# Patient Record
Sex: Female | Born: 1975 | Race: White | Hispanic: No | Marital: Single | Smoking: Former smoker
Health system: Southern US, Community
[De-identification: ages and names within clinical notes are randomized; demographics above are authoritative.]

## PROBLEM LIST (undated history)

## (undated) DIAGNOSIS — D1803 Hemangioma of intra-abdominal structures: Secondary | ICD-10-CM

## (undated) DIAGNOSIS — K2970 Gastritis, unspecified, without bleeding: Secondary | ICD-10-CM

## (undated) DIAGNOSIS — I10 Essential (primary) hypertension: Secondary | ICD-10-CM

## (undated) DIAGNOSIS — G4733 Obstructive sleep apnea (adult) (pediatric): Secondary | ICD-10-CM

## (undated) DIAGNOSIS — K219 Gastro-esophageal reflux disease without esophagitis: Secondary | ICD-10-CM

## (undated) DIAGNOSIS — R7989 Other specified abnormal findings of blood chemistry: Secondary | ICD-10-CM

## (undated) DIAGNOSIS — M109 Gout, unspecified: Secondary | ICD-10-CM

## (undated) DIAGNOSIS — D649 Anemia, unspecified: Secondary | ICD-10-CM

## (undated) DIAGNOSIS — N1832 Chronic kidney disease, stage 3b: Secondary | ICD-10-CM

## (undated) DIAGNOSIS — K209 Esophagitis, unspecified without bleeding: Secondary | ICD-10-CM

## (undated) DIAGNOSIS — D369 Benign neoplasm, unspecified site: Secondary | ICD-10-CM

## (undated) DIAGNOSIS — I5030 Unspecified diastolic (congestive) heart failure: Secondary | ICD-10-CM

## (undated) DIAGNOSIS — E041 Nontoxic single thyroid nodule: Secondary | ICD-10-CM

## (undated) DIAGNOSIS — D219 Benign neoplasm of connective and other soft tissue, unspecified: Secondary | ICD-10-CM

## (undated) DIAGNOSIS — K8020 Calculus of gallbladder without cholecystitis without obstruction: Secondary | ICD-10-CM

## (undated) HISTORY — DX: Essential (primary) hypertension: I10

## (undated) HISTORY — DX: Morbid (severe) obesity due to excess calories: E66.01

## (undated) HISTORY — DX: Unspecified diastolic (congestive) heart failure: I50.30

## (undated) HISTORY — DX: Gastro-esophageal reflux disease without esophagitis: K21.9

## (undated) HISTORY — DX: Benign neoplasm of connective and other soft tissue, unspecified: D21.9

## (undated) HISTORY — PX: HYSTERECTOMY ABDOMINAL WITH SALPINGECTOMY: SHX6725

---

## 2001-01-20 ENCOUNTER — Emergency Department (HOSPITAL_COMMUNITY): Admission: EM | Admit: 2001-01-20 | Discharge: 2001-01-20 | Payer: Self-pay | Admitting: Emergency Medicine

## 2006-09-04 HISTORY — PX: HYSTERECTOMY ABDOMINAL WITH SALPINGECTOMY: SHX6725

## 2007-04-08 ENCOUNTER — Inpatient Hospital Stay (HOSPITAL_COMMUNITY): Admission: RE | Admit: 2007-04-08 | Discharge: 2007-04-10 | Payer: Self-pay | Admitting: Obstetrics & Gynecology

## 2007-04-08 ENCOUNTER — Encounter: Payer: Self-pay | Admitting: Obstetrics & Gynecology

## 2011-01-17 NOTE — Op Note (Signed)
NAMERACQUELLE, EALES NO.:  [id number]   MEDICAL RECORD NO.:  [id number]          PATIENT TYPE:  INP   LOCATION:  A302                          FACILITY:  [facility]   PHYSICIAN:  Florian Buff, M.D.   DATE OF BIRTH:  [birth year]   DATE OF PROCEDURE:  04/08/2007  DATE OF DISCHARGE:                               OPERATIVE REPORT   PREOPERATIVE DIAGNOSES:  1. Metromenorrhagia  2. Dysmenorrhea.  3. Dyspareunia.  4. Morbid obesity.   POSTOPERATIVE DIAGNOSES:  1. Metromenorrhagia  2. Dysmenorrhea.  3. Dyspareunia.  4. Morbid obesity.  5. Left ovarian cyst.   OPERATION PERFORMED:  Total abdominal hysterectomy, bilateral salpingo-  oophorectomy.   SURGEON:  Florian Buff, M.D.   ANESTHESIA:  General endotracheal.   FINDINGS:  The patient had about a 4 cm ovarian cyst on the left side.  She had had that previously about a year and a half ago on ultrasound.  Otherwise, the uterus, tubes and ovaries were normal.   DESCRIPTION OF PROCEDURE:  The patient was taken to the operating room  and placed in supine position where she underwent general endotracheal  anesthesia.  The vagina was prepped.  The Foley catheter was placed.  The abdomen was prepped and draped in the usual sterile fashion.  A  Pfannenstiel skin incision was made and carried down sharply to the  rectus fascia which was scored in the midline and extended laterally.  The fascia was taken off the muscle superiorly and inferiorly without  difficulty.  The muscles were divided, the peritoneal cavity was  entered.  An Alexis self-retaining wound retractor was placed and the  upper abdomen was packed away.  The patient was placed in Trendelenburg  position.  The uterine cornu were grasped.  The left round ligament was  suture ligated and cut.  The infundibulopelvic ligament on the left was  clamped, cut and suture ligated.  The right round ligament was suture  ligated and cut.  The infundibulopelvic  ligament on the right was  clamped, cut and suture ligated.  The bladder was then pushed down off  the lower uterine segment.  She did have some adhesions to the lower  uterine segment from previous cesarean section.  The uterine vessels  were skeletonized.  They were clamped, cut and suture ligated, serial  pedicles taken down to cervix, to the cardinal ligaments, each pedicle  being clamped, but and suture ligated.  The vagina was cross-clamped.  Specimens removed.  Vaginal angle sutures were placed and the vagina was  closed with interrupted figure-of-eight sutures.  The pelvis was found  to be hemostatic.  It was irrigated vigorously.  Sutures were cut.  The  wound retractor and the packs were removed. All counts were correct.  The muscles and peritoneum were reapproximated loosely.  Fascia closed  using 0-0 Vicryl running, subcutaneous tissue was made hemostatic and  irrigated.  A Jackson-Pratt drain was placed because of her obesity.  Skin was closed using skin staples.  The patient tolerated the procedure  well.  She experienced 100 mL of blood loss,  taken to recovery room in  good and stable condition, all counts correct.      Florian Buff, M.D.  Electronically Signed     LHE/MEDQ  D:  04/08/2007  T:  04/08/2007  Job:  [id number]

## 2011-01-17 NOTE — Discharge Summary (Signed)
[name], [name] NO.:  [id number]   MEDICAL RECORD NO.:  [id number]          PATIENT TYPE:  INP   LOCATION:  A302                          FACILITY:  [facility]   PHYSICIAN:  Florian Buff, M.D.   DATE OF BIRTH:  [birth year]   DATE OF ADMISSION:  04/08/2007  DATE OF DISCHARGE:  08/06/2008LH                               DISCHARGE SUMMARY   DISCHARGE DIAGNOSES:  1. Status post TAH BSO.  2. Unremarkable postoperative course.   PROCEDURES:  TAH BSO.   Please refer to the transcribed History and Physical in the OP report  for details of admission to the [facility]:  The patient was admitted postoperatively.  She had a  normal course.  Tolerated clear liquids and regular diet, both without  symptoms.  She was extensively ambulatory, passed flatus.  Her incision  was clean, dry and intact.  JP drain was left in place and had  appropriate postop output.  Hemoglobin and hematocrit were excellent  postop at 13 and 12.7.  Discharged home in the morning of postop day #2  in good stable condition.  Follow up in the office next week to have her  incision evaluated and drain removed and staples removed.  She was given  Percocet and Motrin for pain at the time of discharge, and was given  instruction precautions for return.      Florian Buff, M.D.  Electronically Signed     LHE/MEDQ  D:  04/10/2007  T:  04/10/2007  Job:  [id number]

## 2011-01-17 NOTE — H&P (Signed)
NAMEDERRA, MORETZ NO.:  [id number]   MEDICAL RECORD NO.:  [id number]          PATIENT TYPE:  INP   LOCATION:  A302                          FACILITY:  [facility]   PHYSICIAN:  Florian Buff, M.D.   DATE OF BIRTH:  [birth year]   DATE OF ADMISSION:  04/08/2007  DATE OF DISCHARGE:  LH                              HISTORY & PHYSICAL   HISTORY OF PRESENT ILLNESS:  The patient is a 35 year old female,  Gravida 2, para 1, abortus 1, status post a cesarean section in 2004,  who has been seen in the office several times since the Spring in our  office, complaining of almost continuous bleeding, dyspareunia, and  dysmenorrhea.  Evaluation to this point has been unremarkable.  She  basically states that the intercourse, whenever her cervix is struck,  she has had a transvaginal ultrasound which is otherwise normal except  for a simple cyst in her right ovary that has subsequently resolved.  We  tried her on a course of Megace, a course of antibiotics, none of which  have helped.  This has been going on now for approximately 17 months at  least from the time that she started seeing [place] in her office and she has  not improved.  We discussed other options and the patient wants to  proceed with definitive therapy so will proceed with total abdominal  hysterectomy, bilateral salpingo-oophorectomy.  She has no decent.  She  is morbidly obese.  She has had a cesarean section so I do not think a  vaginal hysterectomy is appropriate.   PAST MEDICAL HISTORY:  The past medical history is negative.   PAST SURGICAL HISTORY:  The past surgical history is significant for a  cesarean section.   PAST OB HISTORY:  As above.   ALLERGIES:  None.   MEDICATIONS:  Medications are just the Megace that we gave her,  otherwise none.   PHYSICAL EXAMINATION:  VITAL SIGNS:  Her blood pressure is 140/88.  HEENT:  Unremarkable.  Her thyroid is normal.  LUNGS:  The lungs are clear.  HEART:  Regular  rate and rhythm without regurgitation or gallop.  BREASTS:  The breasts are without masses, discharge, or skin changes.  ABDOMEN:  The abdomen is benign, no hepatosplenomegaly or masses,  nontender, and is obese.  PELVIC:  She has normal external genitalia.  The vagina is pink, moist,  and without discharge.  The cervix is parous, nulliparous without  lesions.  The uterus is normal size, shape, and contour.  The adnexa is  negative.  EXTREMITIES:  The extremities are warm, no edema.  NEUROLOGICAL:  The neurological examination is virtually intact.   IMPRESSION:  1. Menometrorrhagia.  2. Dysmenorrhea.  3. Dyspareunia.   PLAN:  The patient is admitted for total abdominal hysterectomy,  bilateral salpingo-oophorectomy.  She understands the risks, benefits,  indications, and alternatives, and will proceed.  She understands that  this will make her menopausal but since she is having adnexal or lateral  pain with her periods and with intercourse, this is the appropriate  management, and  she will proceed.      Florian Buff, M.D.  Electronically Signed     LHE/MEDQ  D:  04/07/2007  T:  04/07/2007  Job:  [id number]   cc:   Florian Buff, M.D.  Fax: [phone]

## 2011-06-19 LAB — DIFFERENTIAL
Basophils Relative: 1
Basophils Relative: 1
Eosinophils Absolute: 0
Eosinophils Absolute: 0.1
Eosinophils Absolute: 0.1
Eosinophils Relative: 2
Lymphs Abs: 1.6
Monocytes Absolute: 0.5
Monocytes Relative: 5
Monocytes Relative: 6
Monocytes Relative: 6
Neutrophils Relative %: 62
Neutrophils Relative %: 64
Neutrophils Relative %: 82 — ABNORMAL HIGH

## 2011-06-19 LAB — CBC
HCT: 38.9
Hemoglobin: 14.6
MCHC: 34.2
MCV: 88.7
MCV: 89.7
RBC: 4.16
RBC: 4.39
RBC: 4.84
WBC: 14 — ABNORMAL HIGH
WBC: 6

## 2011-06-19 LAB — TYPE AND SCREEN: ABO/RH(D): O POS

## 2011-06-19 LAB — COMPREHENSIVE METABOLIC PANEL
ALT: 22
Alkaline Phosphatase: 56
CO2: 31
Chloride: 99
GFR calc non Af Amer: >60
Glucose, Bld: 78
Potassium: 3.7
Sodium: 138
Total Protein: 6.1

## 2011-06-19 LAB — URINALYSIS, ROUTINE W REFLEX MICROSCOPIC
Bilirubin Urine: NEGATIVE
Glucose, UA: NEGATIVE
Ketones, ur: NEGATIVE
Leukocytes, UA: NEGATIVE
pH: 7.5

## 2017-10-22 DIAGNOSIS — S68119A Complete traumatic metacarpophalangeal amputation of unspecified finger, initial encounter: Secondary | ICD-10-CM | POA: Insufficient documentation

## 2018-03-27 ENCOUNTER — Other Ambulatory Visit (HOSPITAL_COMMUNITY): Payer: Self-pay | Admitting: *Deleted

## 2018-03-27 DIAGNOSIS — R221 Localized swelling, mass and lump, neck: Secondary | ICD-10-CM

## 2018-04-02 ENCOUNTER — Ambulatory Visit (HOSPITAL_COMMUNITY)
Admission: RE | Admit: 2018-04-02 | Discharge: 2018-04-02 | Disposition: A | Discharge status: Home / Self Care | Payer: Medicaid Other | Source: Ambulatory Visit | Attending: *Deleted | Admitting: *Deleted

## 2018-04-02 DIAGNOSIS — E042 Nontoxic multinodular goiter: Secondary | ICD-10-CM | POA: Insufficient documentation

## 2018-04-02 DIAGNOSIS — R221 Localized swelling, mass and lump, neck: Secondary | ICD-10-CM | POA: Diagnosis present

## 2018-04-02 DIAGNOSIS — R1310 Dysphagia, unspecified: Secondary | ICD-10-CM | POA: Diagnosis not present

## 2018-04-02 DIAGNOSIS — Z87891 Personal history of nicotine dependence: Secondary | ICD-10-CM | POA: Diagnosis not present

## 2018-05-04 IMAGING — US US SOFT TISSUE HEAD/NECK
1 series · 12 of 25 positions shown · non-contrast
Comparison: None.

CLINICAL DATA: Palpable abnormality.  Palpable right neck mass.

EXAM:
THYROID ULTRASOUND
TECHNIQUE: Ultrasound examination of the thyroid gland and adjacent soft
tissues was performed.

[Series 1: us soft tissue head/neck · 0.04mm/px · 12 of 99 slices shown]
[im 5/99]
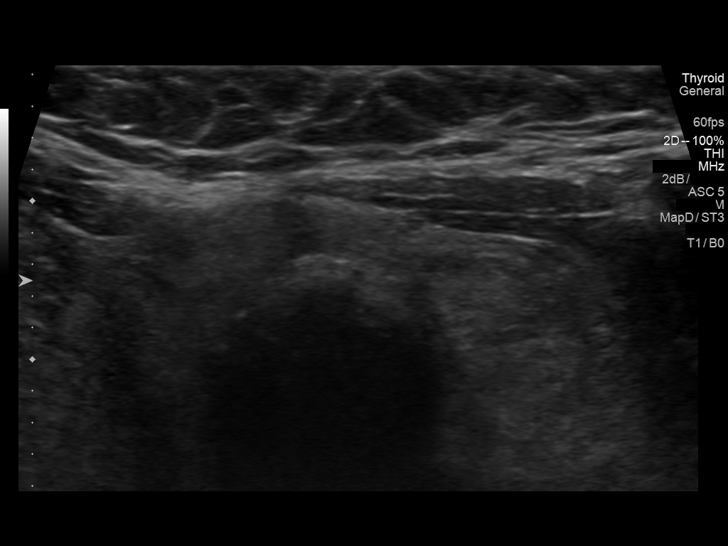
[im 13/99]
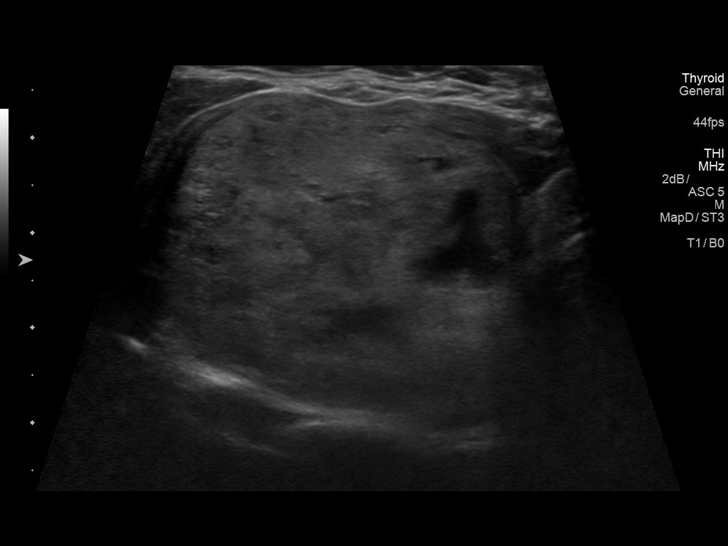
[im 21/99]
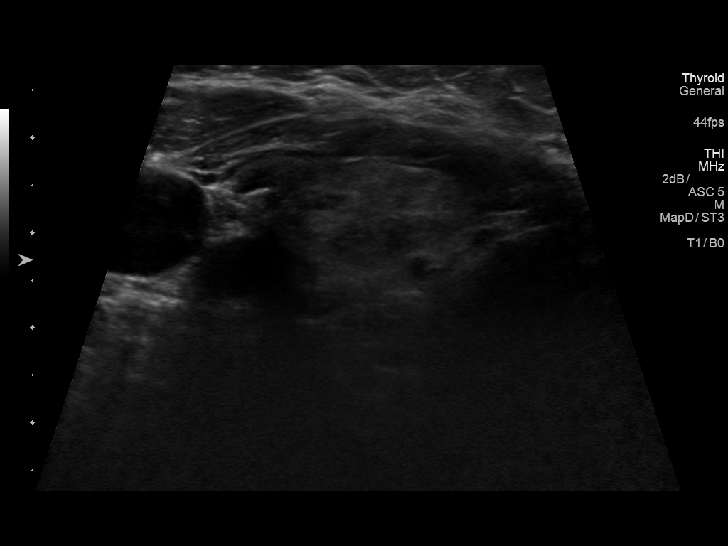
[im 29/99]
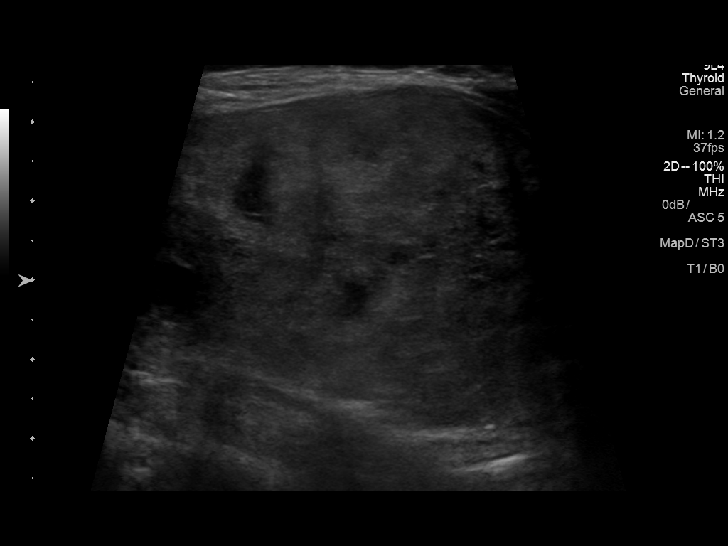
[im 37/99]
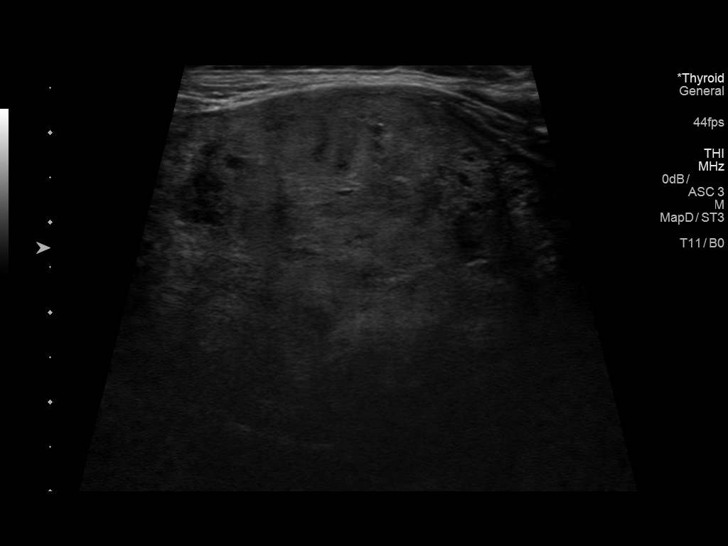
[im 45/99]
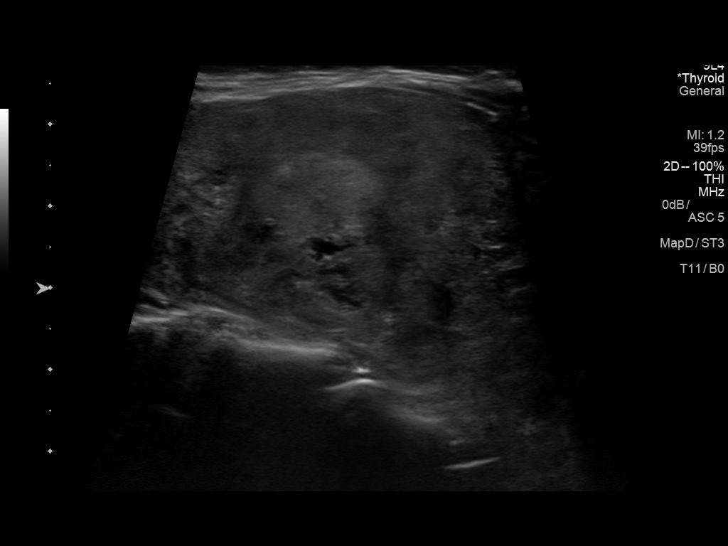
[im 54/99]
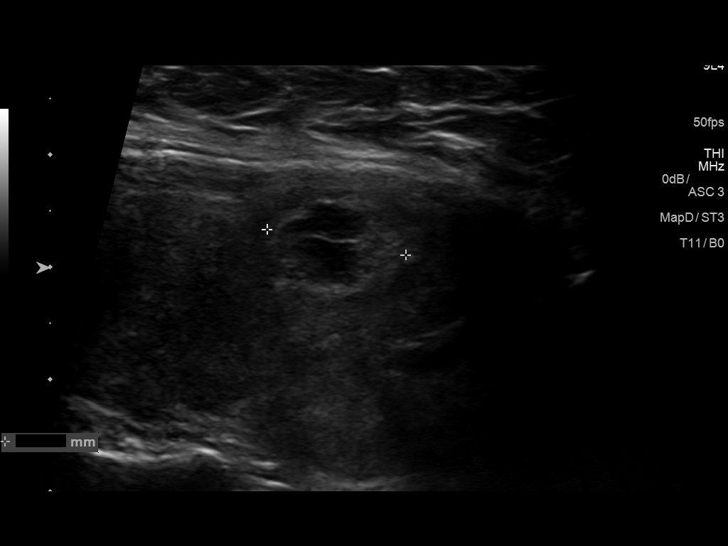
[im 62/99]
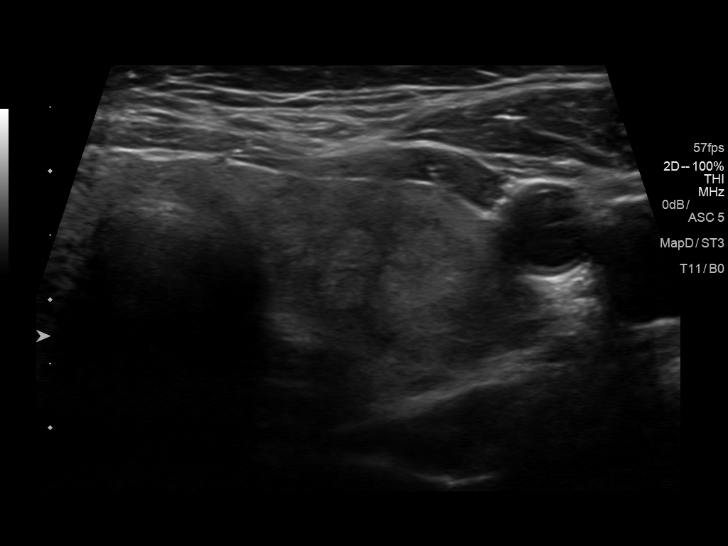
[im 70/99]
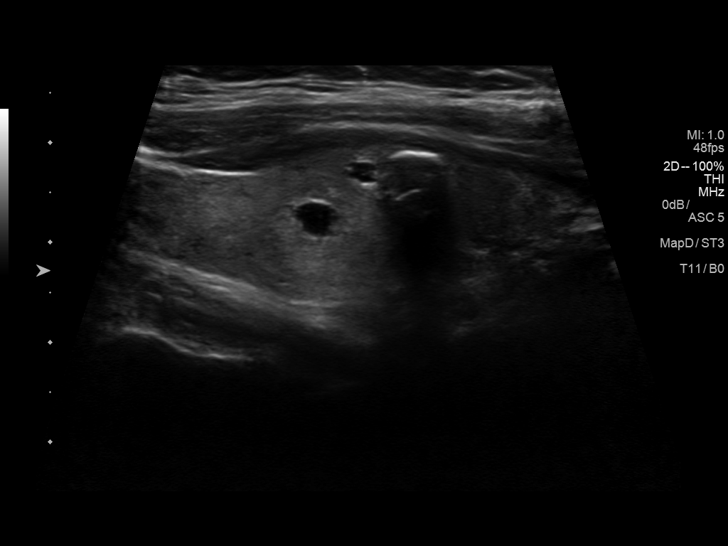
[im 78/99]
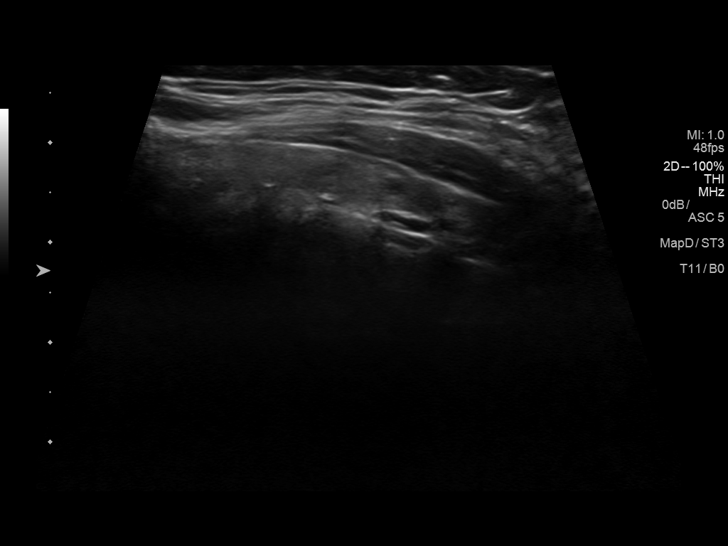
[im 86/99]
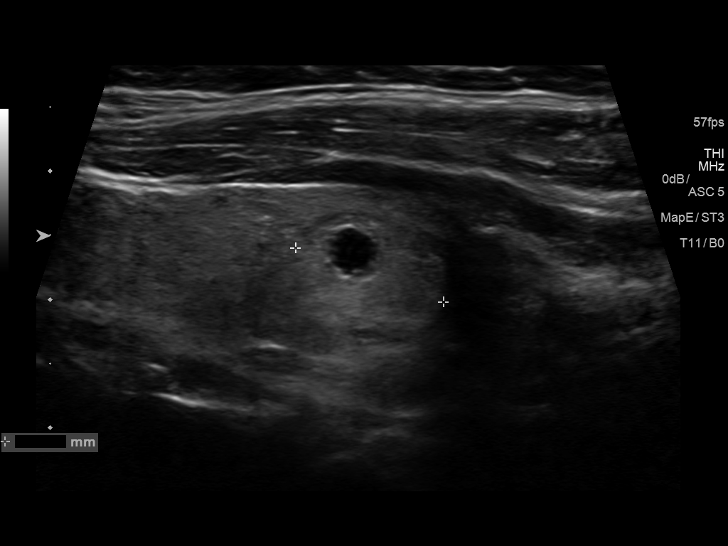
[im 94/99]
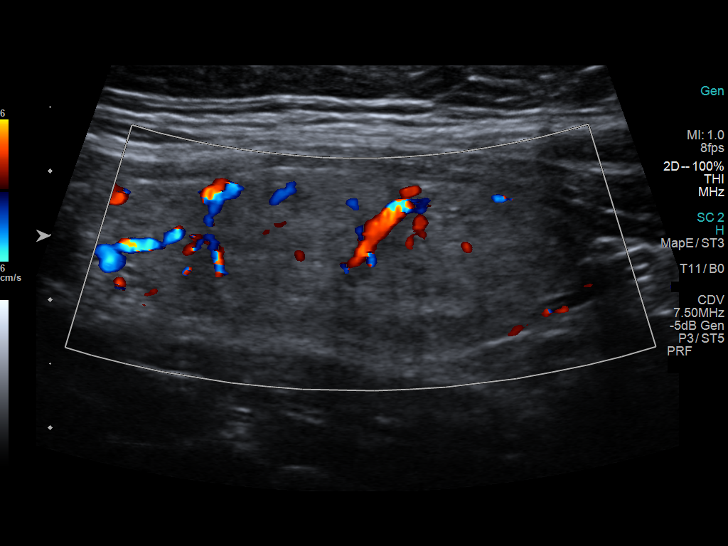

[12 of 25 positions shown; findings below may reference images not displayed]

FINDINGS: Parenchymal Echotexture: Moderately heterogenous

Isthmus: 0.5 cm

Right lobe: 6.2 x 4.2 x 4.1 cm

Left lobe: 5.0 x 2.0 x 2.0 cm

_________________________________________________________

Estimated total number of nodules >/= 1 cm: 3

Number of spongiform nodules >/=  2 cm not described below (TR1): 0

Number of mixed cystic and solid nodules >/= 1.5 cm not described
below (TR2): 0

_________________________________________________________

Nodule # 1:

Location: Right; Mid

Maximum size: 3.8 cm; Other 2 dimensions: 3.0 x 3.7 cm

Composition: solid/almost completely solid (2)

Echogenicity: isoechoic (1)

Shape: not taller-than-wide (0)

Margins: ill-defined (0)

Echogenic foci: none (0)

ACR TI-RADS total points: 3.

ACR TI-RADS risk category: TR3 (3 points).

ACR TI-RADS recommendations:

**Given size (>/= 2.5 cm) and appearance, fine needle aspiration of
this mildly suspicious nodule should be considered based on TI-RADS
criteria.

_________________________________________________________

Nodule # 2:

Location: Right; Superior

Maximum size: 1.3 cm; Other 2 dimensions: 0.9 x 0.9 cm

Composition: mixed cystic and solid (1)

Echogenicity: hyperechoic (1)

Shape: not taller-than-wide (0)

Margins: smooth (0)

Echogenic foci: none (0)

ACR TI-RADS total points: 2.

ACR TI-RADS risk category: TR2 (2 points).

ACR TI-RADS recommendations:

This nodule does NOT meet TI-RADS criteria for biopsy or dedicated
follow-up.

_________________________________________________________

Nodule # 4:

Location: Left; Inferior

Maximum size: 1.2 cm; Other 2 dimensions: 0.9 x 0.8 cm

Composition: solid/almost completely solid (2)

Echogenicity: hyperechoic (1)

Shape: not taller-than-wide (0)

Margins: ill-defined (0)

Echogenic foci: none (0)

ACR TI-RADS total points: 3.

ACR TI-RADS risk category: TR3 (3 points).

ACR TI-RADS recommendations:

Given size (<1.4 cm) and appearance, this nodule does NOT meet
TI-RADS criteria for biopsy or dedicated follow-up.

_________________________________________________________

Nodule # 5:

Location: Left; Inferior

Maximum size: 1.6 cm; Other 2 dimensions: 1.5 x 1.2 cm

Composition: solid/almost completely solid (2)

Echogenicity: isoechoic (1)

Shape: not taller-than-wide (0)

Margins: ill-defined (0)

Echogenic foci: none (0)

ACR TI-RADS total points: 3.

ACR TI-RADS risk category: TR3 (3 points).

ACR TI-RADS recommendations:

*Given size (>/= 1.5 - 2.4 cm) and appearance, a follow-up
ultrasound in 1 year should be considered based on TI-RADS criteria.
There is an adjacent area of nodularity measuring approximately
cm that was included in the longitudinal diameter measurement by the
sonographer. This is felt to represent a separate area of
nodularity. This is not well imaged in the transverse plane and
therefore accurate 3 plane measurement cannot be determined.

_________________________________________________________

No abnormal lymph nodes identified.
IMPRESSION: 1. 3.8 cm right mid thyroid nodule (nodule 1) corresponding to the
palpable abnormality meets criteria for fine-needle aspiration.
2. A roughly 1.6 cm left inferior thyroid nodule (nodule 5) meets
criteria for 1 year follow-up ultrasound. Adjacent area of
nodularity measures approximately 1.3 cm and can also be reassessed
in 1 year.
3. 1.3 cm right superior and 1.2 cm left inferior nodules do not
meet criteria for biopsy or further follow-up.

The above is in keeping with the ACR TI-RADS recommendations - [HOSPITAL] [9A];[DATE].

## 2018-07-03 ENCOUNTER — Encounter (INDEPENDENT_AMBULATORY_CARE_PROVIDER_SITE_OTHER): Payer: Self-pay

## 2018-07-03 ENCOUNTER — Encounter: Payer: Self-pay | Admitting: Obstetrics and Gynecology

## 2018-07-03 ENCOUNTER — Ambulatory Visit: Payer: Medicaid Other | Admitting: Obstetrics and Gynecology

## 2018-07-03 DIAGNOSIS — Z01419 Encounter for gynecological examination (general) (routine) without abnormal findings: Secondary | ICD-10-CM

## 2018-07-03 DIAGNOSIS — Z309 Encounter for contraceptive management, unspecified: Secondary | ICD-10-CM

## 2018-07-03 NOTE — Patient Instructions (Signed)

## 2018-07-03 NOTE — Progress Notes (Signed)
Lauren Osborn is a 42 y.o. G48P1011 female here for a routine annual gynecologic exam.  She has no GYN complaints. S/P hysterectomy in 2008. Not sexual active. Never had a mammogram.    .    Gynecologic History No LMP recorded. Patient has had a hysterectomy. Contraception: status post hysterectomy Last Pap: uncertain. Results were: normal Last mammogram: Never had. Results were: NA  Obstetric History OB History  Gravida Para Term Preterm AB Living  2 1 1   1 1   SAB TAB Ectopic Multiple Live Births               # Outcome Date GA Lbr Len/2nd Weight Sex Delivery Anes PTL Lv  2 AB           1 Term             Past Medical History:  Diagnosis Date  . Fibroids     Past Surgical History:  Procedure Laterality Date  . HYSTERECTOMY ABDOMINAL WITH SALPINGECTOMY      No current outpatient medications on file prior to visit.   No current facility-administered medications on file prior to visit.     No Known Allergies  Social History   Socioeconomic History  . Marital status: Single    Spouse name: Not on file  . Number of children: Not on file  . Years of education: Not on file  . Highest education level: Not on file  Occupational History  . Not on file  Social Needs  . Financial resource strain: Not on file  . Food insecurity:    Worry: Not on file    Inability: Not on file  . Transportation needs:    Medical: Not on file    Non-medical: Not on file  Tobacco Use  . Smoking status: Former Research scientist (life sciences)  . Smokeless tobacco: Never Used  Substance and Sexual Activity  . Alcohol use: Never    Frequency: Never  . Drug use: Never  . Sexual activity: Not Currently    Birth control/protection: Surgical  Lifestyle  . Physical activity:    Days per week: Not on file    Minutes per session: Not on file  . Stress: Not on file  Relationships  . Social connections:    Talks on phone: Not on file    Gets together: Not on file    Attends religious service: Not on file   Active member of club or organization: Not on file    Attends meetings of clubs or organizations: Not on file    Relationship status: Not on file  . Intimate partner violence:    Fear of current or ex partner: Not on file    Emotionally abused: Not on file    Physically abused: Not on file    Forced sexual activity: Not on file  Other Topics Concern  . Not on file  Social History Narrative  . Not on file    History reviewed. No pertinent family history.  The following portions of the patient's history were reviewed and updated as appropriate: allergies, current medications, past family history, past medical history, past social history, past surgical history and problem list.  Review of Systems Pertinent items noted in HPI and remainder of comprehensive ROS otherwise negative.   Objective:  BP 136/85 (BP Location: Left Arm, Patient Position: Sitting, Cuff Size: Normal)   Pulse 81   Ht 5\' 3"  (1.6 m)   Wt 278 lb 9.6 oz (126.4 kg)   BMI  49.35 kg/m  CONSTITUTIONAL: Well-developed, well-nourished female in no acute distress.  HENT:  Normocephalic, atraumatic, External right and left ear normal. Oropharynx is clear and moist EYES: Conjunctivae and EOM are normal. Pupils are equal, round, and reactive to light. No scleral icterus.  NECK: Normal range of motion, supple, no masses.  Normal thyroid.  SKIN: Skin is warm and dry. No rash noted. Not diaphoretic. No erythema. No pallor. [place]: Alert and oriented to person, place, and time. Normal reflexes, muscle tone coordination. No cranial nerve deficit noted. PSYCHIATRIC: Normal mood and affect. Normal behavior. Normal judgment and thought content. CARDIOVASCULAR: Normal heart rate noted, regular rhythm RESPIRATORY: Clear to auscultation bilaterally. Effort and breath sounds normal, no problems with respiration noted. BREASTS: Symmetric in size. No masses, skin changes, nipple drainage, or lymphadenopathy. ABDOMEN: Soft, normal bowel  sounds, no distention noted.  No tenderness, rebound or guarding.  PELVIC: Normal appearing external genitalia;masses, vaginal mucosa slightly atrophic, cervix and uterus surgerical absent, no masses or tenderness MUSCULOSKELETAL: Normal range of motion. No tenderness.  No cyanosis, clubbing, or edema.  2+ distal pulses.   Assessment:  Annual gynecologic examination with pap smear   Plan:  Will follow up results of pap smear and manage accordingly. Mammogram to be scheduled by pt Routine preventative health maintenance measures emphasized. Please refer to After Visit Summary for other counseling recommendations.    Chancy Milroy, MD, [place] Attending [place] for [facility], [place]

## 2018-08-29 ENCOUNTER — Ambulatory Visit (INDEPENDENT_AMBULATORY_CARE_PROVIDER_SITE_OTHER): Payer: Self-pay | Admitting: Otolaryngology

## 2018-08-29 DIAGNOSIS — R1312 Dysphagia, oropharyngeal phase: Secondary | ICD-10-CM

## 2018-08-29 DIAGNOSIS — D440 Neoplasm of uncertain behavior of thyroid gland: Secondary | ICD-10-CM

## 2018-09-25 ENCOUNTER — Other Ambulatory Visit: Payer: Self-pay | Admitting: Otolaryngology

## 2018-10-11 ENCOUNTER — Encounter (HOSPITAL_BASED_OUTPATIENT_CLINIC_OR_DEPARTMENT_OTHER): Payer: Self-pay | Admitting: *Deleted

## 2018-10-11 ENCOUNTER — Other Ambulatory Visit: Payer: Self-pay

## 2018-10-21 ENCOUNTER — Ambulatory Visit (HOSPITAL_BASED_OUTPATIENT_CLINIC_OR_DEPARTMENT_OTHER)
Admission: RE | Admit: 2018-10-21 | Discharge: 2018-10-22 | Disposition: A | Discharge status: Home / Self Care | Payer: Medicaid Other | Attending: Otolaryngology | Admitting: Otolaryngology

## 2018-10-21 ENCOUNTER — Ambulatory Visit (HOSPITAL_BASED_OUTPATIENT_CLINIC_OR_DEPARTMENT_OTHER): Payer: Medicaid Other | Admitting: Anesthesiology

## 2018-10-21 ENCOUNTER — Encounter (HOSPITAL_BASED_OUTPATIENT_CLINIC_OR_DEPARTMENT_OTHER)
Admission: RE | Disposition: A | Discharge status: Home / Self Care | Payer: Self-pay | Source: Home / Self Care | Attending: Otolaryngology

## 2018-10-21 ENCOUNTER — Other Ambulatory Visit: Payer: Self-pay

## 2018-10-21 ENCOUNTER — Encounter (HOSPITAL_BASED_OUTPATIENT_CLINIC_OR_DEPARTMENT_OTHER): Payer: Self-pay

## 2018-10-21 DIAGNOSIS — Z87891 Personal history of nicotine dependence: Secondary | ICD-10-CM | POA: Diagnosis not present

## 2018-10-21 DIAGNOSIS — E890 Postprocedural hypothyroidism: Secondary | ICD-10-CM

## 2018-10-21 DIAGNOSIS — E042 Nontoxic multinodular goiter: Secondary | ICD-10-CM | POA: Insufficient documentation

## 2018-10-21 DIAGNOSIS — D440 Neoplasm of uncertain behavior of thyroid gland: Secondary | ICD-10-CM

## 2018-10-21 DIAGNOSIS — E041 Nontoxic single thyroid nodule: Secondary | ICD-10-CM | POA: Diagnosis not present

## 2018-10-21 DIAGNOSIS — Z9889 Other specified postprocedural states: Secondary | ICD-10-CM

## 2018-10-21 DIAGNOSIS — Z6841 Body Mass Index (BMI) 40.0 and over, adult: Secondary | ICD-10-CM | POA: Insufficient documentation

## 2018-10-21 HISTORY — DX: Nontoxic single thyroid nodule: E04.1

## 2018-10-21 HISTORY — PX: THYROIDECTOMY: SHX17

## 2018-10-21 HISTORY — DX: Gout, unspecified: M10.9

## 2018-10-21 SURGERY — THYROIDECTOMY
Anesthesia: General | Site: Neck | Laterality: Right

## 2018-10-21 MED ORDER — AMOXICILLIN 875 MG PO TABS: 875.0000 mg | ORAL_TABLET | Freq: Two times a day (BID) | ORAL | 0 refills | Status: AC

## 2018-10-21 MED ORDER — FENTANYL CITRATE (PF) 100 MCG/2ML IJ SOLN
INTRAMUSCULAR | Status: AC
  Filled 2018-10-21: qty 2

## 2018-10-21 MED ORDER — ONDANSETRON HCL 4 MG/2ML IJ SOLN: 4.0000 mg | INTRAMUSCULAR | Status: DC | PRN

## 2018-10-21 MED ORDER — ACETAMINOPHEN 160 MG/5ML PO SOLN: 650.0000 mg | ORAL | Status: DC | PRN

## 2018-10-21 MED ORDER — DEXAMETHASONE SODIUM PHOSPHATE 10 MG/ML IJ SOLN
INTRAMUSCULAR | Status: DC | PRN
  Administered 2018-10-21: 10 mg via INTRAVENOUS

## 2018-10-21 MED ORDER — FENTANYL CITRATE (PF) 100 MCG/2ML IJ SOLN
25.0000 ug | INTRAMUSCULAR | Status: DC | PRN
  Administered 2018-10-21: 50 ug via INTRAVENOUS

## 2018-10-21 MED ORDER — PROPOFOL 10 MG/ML IV BOLUS
INTRAVENOUS | Status: DC | PRN
  Administered 2018-10-21: 200 mg via INTRAVENOUS

## 2018-10-21 MED ORDER — KCL IN DEXTROSE-NACL 20-5-0.45 MEQ/L-%-% IV SOLN
INTRAVENOUS | Status: DC
  Administered 2018-10-21: 14:00:00 via INTRAVENOUS
  Filled 2018-10-21: qty 1000

## 2018-10-21 MED ORDER — LIDOCAINE-EPINEPHRINE 1 %-1:100000 IJ SOLN
INTRAMUSCULAR | Status: DC | PRN
  Administered 2018-10-21: 3 mL

## 2018-10-21 MED ORDER — MORPHINE SULFATE (PF) 4 MG/ML IV SOLN: 2.0000 mg | INTRAVENOUS | Status: DC | PRN

## 2018-10-21 MED ORDER — ACETAMINOPHEN 650 MG RE SUPP: 650.0000 mg | RECTAL | Status: DC | PRN

## 2018-10-21 MED ORDER — ONDANSETRON HCL 4 MG/2ML IJ SOLN
INTRAMUSCULAR | Status: DC | PRN
  Administered 2018-10-21: 4 mg via INTRAVENOUS

## 2018-10-21 MED ORDER — LIDOCAINE 2% (20 MG/ML) 5 ML SYRINGE
INTRAMUSCULAR | Status: DC | PRN
  Administered 2018-10-21: 100 mg via INTRAVENOUS

## 2018-10-21 MED ORDER — LACTATED RINGERS IV SOLN
INTRAVENOUS | Status: DC
  Administered 2018-10-21: 09:00:00 via INTRAVENOUS

## 2018-10-21 MED ORDER — HYDROCODONE-ACETAMINOPHEN 5-325 MG PO TABS: 1.0000 | ORAL_TABLET | ORAL | 0 refills | Status: DC | PRN

## 2018-10-21 MED ORDER — MIDAZOLAM HCL 2 MG/2ML IJ SOLN
INTRAMUSCULAR | Status: AC
  Filled 2018-10-21: qty 2

## 2018-10-21 MED ORDER — SUCCINYLCHOLINE CHLORIDE 200 MG/10ML IV SOSY
PREFILLED_SYRINGE | INTRAVENOUS | Status: DC | PRN
  Administered 2018-10-21: 140 mg via INTRAVENOUS

## 2018-10-21 MED ORDER — CEFAZOLIN SODIUM-DEXTROSE 2-4 GM/100ML-% IV SOLN
INTRAVENOUS | Status: AC
  Filled 2018-10-21: qty 100

## 2018-10-21 MED ORDER — LIDOCAINE-EPINEPHRINE 1 %-1:100000 IJ SOLN
INTRAMUSCULAR | Status: AC
Start: 2018-10-21 — End: ?
  Filled 2018-10-21: qty 1

## 2018-10-21 MED ORDER — BACITRACIN ZINC 500 UNIT/GM EX OINT
TOPICAL_OINTMENT | CUTANEOUS | Status: AC
  Filled 2018-10-21: qty 0.9

## 2018-10-21 MED ORDER — ONDANSETRON HCL 4 MG PO TABS: 4.0000 mg | ORAL_TABLET | ORAL | Status: DC | PRN

## 2018-10-21 MED ORDER — MIDAZOLAM HCL 2 MG/2ML IJ SOLN
1.0000 mg | INTRAMUSCULAR | Status: DC | PRN
  Administered 2018-10-21: 2 mg via INTRAVENOUS

## 2018-10-21 MED ORDER — ACETAMINOPHEN 500 MG PO TABS
ORAL_TABLET | ORAL | Status: AC
  Filled 2018-10-21: qty 2

## 2018-10-21 MED ORDER — FENTANYL CITRATE (PF) 100 MCG/2ML IJ SOLN
50.0000 ug | INTRAMUSCULAR | Status: DC | PRN
  Administered 2018-10-21: 100 ug via INTRAVENOUS
  Administered 2018-10-21: 25 ug via INTRAVENOUS

## 2018-10-21 MED ORDER — HYDROCODONE-ACETAMINOPHEN 5-325 MG PO TABS
1.0000 | ORAL_TABLET | ORAL | Status: DC | PRN
  Administered 2018-10-21 – 2018-10-22 (×2): 2 via ORAL
  Filled 2018-10-21 (×2): qty 2

## 2018-10-21 MED ORDER — EPHEDRINE SULFATE-NACL 50-0.9 MG/10ML-% IV SOSY
PREFILLED_SYRINGE | INTRAVENOUS | Status: DC | PRN
  Administered 2018-10-21: 10 mg via INTRAVENOUS

## 2018-10-21 MED ORDER — SCOPOLAMINE 1 MG/3DAYS TD PT72: 1.0000 | MEDICATED_PATCH | Freq: Once | TRANSDERMAL | Status: DC | PRN

## 2018-10-21 MED ORDER — ACETAMINOPHEN 500 MG PO TABS
1000.0000 mg | ORAL_TABLET | Freq: Once | ORAL | Status: AC
  Administered 2018-10-21: 1000 mg via ORAL

## 2018-10-21 SURGICAL SUPPLY — 66 items
ADH SKN CLS APL DERMABOND .7 (GAUZE/BANDAGES/DRESSINGS) ×1
ATTRACTOMAT 16X20 MAGNETIC DRP (DRAPES) IMPLANT
BLADE CLIPPER SURG (BLADE) IMPLANT
BLADE SURG 10 STRL SS (BLADE) IMPLANT
BLADE SURG 15 STRL LF DISP TIS (BLADE) ×1 IMPLANT
BLADE SURG 15 STRL SS (BLADE) ×3
CANISTER SUCT 1200ML W/VALVE (MISCELLANEOUS) ×3 IMPLANT
CLIP VESOCCLUDE SM WIDE 6/CT (CLIP) IMPLANT
CORD BIPOLAR FORCEPS 12FT (ELECTRODE) ×3 IMPLANT
COVER BACK TABLE 60X90IN (DRAPES) ×3 IMPLANT
COVER MAYO STAND STRL (DRAPES) ×3 IMPLANT
COVER WAND RF STERILE (DRAPES) IMPLANT
DECANTER SPIKE VIAL GLASS SM (MISCELLANEOUS) IMPLANT
DERMABOND ADVANCED (GAUZE/BANDAGES/DRESSINGS) ×2
DERMABOND ADVANCED .7 DNX12 (GAUZE/BANDAGES/DRESSINGS) ×1 IMPLANT
DRAIN CHANNEL 10F 3/8 F FF (DRAIN) IMPLANT
DRAPE U-SHAPE 76X120 STRL (DRAPES) ×3 IMPLANT
ELECT COATED BLADE 2.86 ST (ELECTRODE) ×3 IMPLANT
ELECT REM PT RETURN 9FT ADLT (ELECTROSURGICAL) ×3
ELECTRODE REM PT RTRN 9FT ADLT (ELECTROSURGICAL) ×1 IMPLANT
EVACUATOR SILICONE 100CC (DRAIN) IMPLANT
FORCEPS BIPOLAR SPETZLER 8 1.0 (NEUROSURGERY SUPPLIES) ×3 IMPLANT
GAUZE 4X4 16PLY RFD (DISPOSABLE) ×3 IMPLANT
GAUZE SPONGE 4X4 12PLY STRL LF (GAUZE/BANDAGES/DRESSINGS) IMPLANT
GLOVE BIO SURGEON STRL SZ 6.5 (GLOVE) IMPLANT
GLOVE BIO SURGEON STRL SZ7 (GLOVE) ×6 IMPLANT
GLOVE BIO SURGEON STRL SZ7.5 (GLOVE) ×3 IMPLANT
GLOVE BIO SURGEONS STRL SZ 6.5 (GLOVE)
GLOVE BIOGEL PI IND STRL 7.5 (GLOVE) IMPLANT
GLOVE BIOGEL PI INDICATOR 7.5 (GLOVE) ×4
GLOVE EXAM NITRILE MD LF STRL (GLOVE) ×2 IMPLANT
GOWN STRL REUS W/ TWL LRG LVL3 (GOWN DISPOSABLE) ×2 IMPLANT
GOWN STRL REUS W/ TWL XL LVL3 (GOWN DISPOSABLE) IMPLANT
GOWN STRL REUS W/TWL LRG LVL3 (GOWN DISPOSABLE) ×6
GOWN STRL REUS W/TWL XL LVL3 (GOWN DISPOSABLE) ×3
HEMOSTAT SURGICEL 2X14 (HEMOSTASIS) IMPLANT
NDL HYPO 25X1 1.5 SAFETY (NEEDLE) ×1 IMPLANT
NEEDLE HYPO 25X1 1.5 SAFETY (NEEDLE) ×3 IMPLANT
NS IRRIG 1000ML POUR BTL (IV SOLUTION) ×3 IMPLANT
PACK BASIN DAY SURGERY FS (CUSTOM PROCEDURE TRAY) ×3 IMPLANT
PENCIL BUTTON HOLSTER BLD 10FT (ELECTRODE) ×3 IMPLANT
PIN SAFETY STERILE (MISCELLANEOUS) IMPLANT
PROBE NERVBE PRASS .33 (MISCELLANEOUS) ×3 IMPLANT
SHEARS HARMONIC 9CM CVD (BLADE) ×3 IMPLANT
SLEEVE SCD COMPRESS KNEE MED (MISCELLANEOUS) ×3 IMPLANT
SPONGE INTESTINAL PEANUT (DISPOSABLE) ×3 IMPLANT
STAPLER VISISTAT 35W (STAPLE) IMPLANT
SUT ETHILON 3 0 PS 1 (SUTURE) ×3 IMPLANT
SUT PROLENE 5 0 P 3 (SUTURE) IMPLANT
SUT SILK 2 0 SH (SUTURE) ×5 IMPLANT
SUT SILK 2 0 TIES 17X18 (SUTURE)
SUT SILK 2-0 18XBRD TIE BLK (SUTURE) IMPLANT
SUT SILK 3 0 TIES 17X18 (SUTURE) ×6
SUT SILK 3-0 18XBRD TIE BLK (SUTURE) ×2 IMPLANT
SUT VIC AB 3-0 FS2 27 (SUTURE) ×5 IMPLANT
SUT VICRYL 4-0 PS2 18IN ABS (SUTURE) ×3 IMPLANT
SYR BULB 3OZ (MISCELLANEOUS) ×3 IMPLANT
SYR CONTROL 10ML LL (SYRINGE) ×3 IMPLANT
TOWEL GREEN STERILE FF (TOWEL DISPOSABLE) ×6 IMPLANT
TRAY DSU PREP LF (CUSTOM PROCEDURE TRAY) ×3 IMPLANT
TUBE CONNECTING 20'X1/4 (TUBING) ×1
TUBE CONNECTING 20X1/4 (TUBING) ×2 IMPLANT
TUBE ENDOTRAC NIMS EMG 6MM (MISCELLANEOUS) IMPLANT
TUBE ENDOTRAC NIMS EMG 7MM (MISCELLANEOUS) IMPLANT
TUBE ENDOTRAC NIMS EMG 8MM (MISCELLANEOUS) ×2 IMPLANT
TUBE ENDOTRAC NIMS EMG 9MM (MISCELLANEOUS) IMPLANT

## 2018-10-21 NOTE — H&P (Signed)
Cc: Thyroid nodules  HPI: The patient is a 43 y/o female who presents today for evaluation of thyroid nodules. The patient is seen in consultation requested by [facility]. The patient first noticed a lump in her neck over a year ago. She feels like it has gradually increased in size. The patient notes occasional dysphagia. She denies any difficulty breathing. The patient recently had a neck ultrasound which showed bilateral thyroid nodules with the largest measuring 3.8 cm on the right. No previous ENT surgery is noted.   The patient's review of systems (constitutional, eyes, ENT, cardiovascular, respiratory, GI, musculoskeletal, skin, neurologic, psychiatric, endocrine, hematologic, allergic) is noted in the ROS questionnaire.  It is reviewed with the patient.   Family health history: Diabetes.  Major events: None.  Ongoing medical problems: Allergies, hay fever.  Social history: The patient is single.  She rarely drinks alcohol.  She denies the use of tobacco or illegal drugs.  Exam: General: Communicates without difficulty, well nourished, no acute distress. Head: Normocephalic, no evidence injury, no tenderness, facial buttresses intact without stepoff. Eyes: PERRL, EOMI.  No scleral icterus, conjunctivae clear. Ears: External auditory canals clear bilaterally.  There is no edema or erythema.  Tympanic membrane is within normal limits bilaterally. Nose: Normal skin and external support.  Anterior rhinoscopy reveals healthy pink mucosa over the septum and turbinates.  No lesions or polyps were seen. Oral cavity: Lips without lesions, oral mucosa moist, no masses or lesions seen. Indirect  mirror laryngoscopy could not be tolerated. Pharynx: Clear, no erythema. Neck: Supple, full range of motion, no lymphadenopathy. Salivary: Parotid and submandibular glands without mass. Right thyroid nodule palpable. Neuro:  CN 2-12 grossly intact. Gait normal. Vestibular: No nystagmus at  any point of gaze.   Procedure:  Flexible Fiberoptic Laryngoscopy -- Risks, benefits, and alternatives of flexible endoscopy were explained to the patient.  Specific mention was made of the risk of throat numbness with difficulty swallowing, possible bleeding from the nose and mouth, and pain from the procedure.  The patient gave oral consent to proceed.  The nasal cavities were decongested and anesthetised with a combination of oxymetazoline and 4% lidocaine solution.  The flexible scope was inserted into the right nasal cavity and advanced towards the nasopharynx.  Visualized mucosa over the turbinates and septum were as described above.  The nasopharynx was clear.  Oropharyngeal walls were symmetric and mobile without lesion, mass, or edema.  Hypopharynx was also without  lesion or edema.  Larynx was mobile without lesions. Supraglottic structures were free of edema, mass, and asymmetry.  True vocal folds were white and mobile bilaterally.  Base of tongue was within normal limits.  The patient tolerated the procedure well.   Assessment 1. The patient is noted to have a multinodular thyroid goiter with the largest nodule measuring 3.8 cm. 2. The patient is currently experiencing compressive symptoms. 3. Vocal cords are mobile. No other suspicious mass or lesion is noted on today's fiberoptic laryngoscopy exam.  Plan  1. The physical exam, endoscopy, and ultrasound findings are reviewed with the patient. 2. Treatment options include observation, FNA, or right hemithyroidectomy. The risks, benefits, alternatives, and details of the procedure are reviewed with the patient. Questions are invited and answered.  3. The patient is interested in proceeding with a right hemithyroidectomy.  We will schedule the procedure in accordance with the family schedule.

## 2018-10-21 NOTE — Discharge Instructions (Addendum)
Thyroidectomy, Care After This sheet gives you information about how to care for yourself after your procedure. Your health care provider may also give you more specific instructions. If you have problems or questions, contact your health care provider. What can I expect after the procedure? After the procedure, it is common to have:  Mild pain in the neck or upper body, especially when swallowing.  A swollen neck.  A sore throat.  A weak or hoarse voice.  Slight tingling or numbness around your mouth, or in your fingers or toes. This may last for a day or two after surgery. This condition is caused by low levels of calcium. You may be given calcium supplements to treat it. Follow these instructions at home:  Medicines  Take over-the-counter and prescription medicines only as told by your health care provider.  Do not drive or use heavy machinery while taking prescription pain medicine.  Do not take medicines that contain aspirin and ibuprofen until your health care provider says that you can. These medicines can increase your risk of bleeding. Eating and drinking  Start slowly with eating. You may need to have only liquids and soft foods for a few days or as directed by your health care provider.  To prevent or treat constipation while you are taking prescription pain medicine, your health care provider may recommend that you: ? Drink enough fluid to keep your urine pale yellow. ? Take over-the-counter or prescription medicines. ? Eat foods that are high in fiber, such as fresh fruits and vegetables, whole grains, and beans. ? Limit foods that are high in fat and processed sugars, such as fried and sweet foods. Incision care  Follow instructions from your health care provider about how to take care of your incision. Make sure you: ? Wash your hands with soap and water before you change your bandage (dressing). If soap and water are not available, use hand sanitizer. ? Leave  stitches (sutures), skin glue, or adhesive strips in place. These skin closures may need to stay in place for 2 weeks or longer. If adhesive strip edges start to loosen and curl up, you may trim the loose edges. Do not remove adhesive strips completely unless your health care provider tells you to do that.  Check your incision area every day for signs of infection. Check for: ? Redness, swelling, or pain. ? Fluid or blood. ? Warmth. ? Pus or a bad smell. Activity  For the first 10 days after the procedure or as instructed by your health care provider: ? Do not lift anything that is heavier than 10 lb (4.5 kg). ? Do not jog, swim, or do other strenuous exercises. ? Do not play contact sports.  Avoid sitting for a long time without moving. Get up to take short walks every 1-2 hours. This is needed to improve blood flow and breathing. Ask for help if you feel weak or unsteady.  Return to your normal activities as told by your health care provider. Ask your health care provider what activities are safe for you. General instructions  Do not use any products that contain nicotine or tobacco, such as cigarettes and e-cigarettes. These can delay healing after surgery. If you need help quitting, ask your health care provider.  Keep all follow-up visits as told by your health care provider. This is important. Your health care provider needs to monitor the calcium level in your blood to make sure that it does not become low. Contact a health care  provider if you:  Have a fever.  Have more redness, swelling, or pain around your incision area.  Have fluid or blood coming from your incision area.  Notice that your incision area feels warm to the touch.  Have pus or a bad smell coming from your incision area.  Have trouble talking.  Have nausea or vomiting for more than 2 days. Get help right away if you:  Have trouble breathing.  Have trouble swallowing.  Develop a rash.  Develop a  cough that gets worse.  Notice that your speech changes, or you have hoarseness that gets worse.  Develop numbness, tingling, or muscle spasms in the arms, hands, feet, or face. Summary  After the procedure, it is common to feel mild pain in the neck or upper body, especially when swallowing.  Take medicines as told by your health care provider. These include pain medicines and thyroid hormones, if required.  Follow instructions from your health care provider about how to take care of your incision. Watch for signs of infection.  Keep all follow-up visits as told by your health care provider. This is important. Your health care provider needs to monitor the calcium level in your blood to make sure that it does not become low.  Get help right away if you develop difficulty breathing, or numbness, tingling, or muscle spasms in the arms, hands, feet, or face. This information is not intended to replace advice given to you by your health care provider. Make sure you discuss any questions you have with your health care provider. Document Released: 03/10/2005 Document Revised: 06/26/2017 Document Reviewed: 06/26/2017 Elsevier Interactive Patient Education  2019 [name] this sheet to all of your post-operative appointments while you have your drains.  Please measure your drains by CC's or ML's.  Make sure you drain and measure your JP Drains 2 or 3 times per day.  At the end of each day, add up totals for the left side and add up totals for the right side.    ( 9 am )     ( 3 pm )        ( 9 pm )                Date L  R  L  R  L  R  Total L/R  Post Anesthesia Home Care Instructions  Activity: Get plenty of rest for the remainder of  the day. A responsible individual must stay with you for 24 hours following the procedure.  For the next 24 hours, DO NOT: -Drive a car -Paediatric nurse -Drink alcoholic beverages -Take any medication unless instructed by your physician -Make any legal decisions or sign important papers.  Meals: Start with liquid foods such as gelatin or soup. Progress to regular foods as tolerated. Avoid greasy, spicy, heavy foods. If nausea and/or vomiting occur, drink only clear liquids until the nausea and/or vomiting subsides. Call your physician if vomiting continues.  Special Instructions/Symptoms: Your throat may feel dry or sore from the anesthesia or the breathing tube placed in your throat during surgery. If this causes discomfort, gargle with warm salt water. The discomfort should disappear within 24 hours.  If you had a scopolamine patch placed behind your ear for the management of post- operative nausea and/or vomiting:  1. The medication in the patch is effective for 72 hours, after which it should be removed.  Wrap patch in a tissue and discard in the trash. Wash hands thoroughly with soap and water. 2. You may remove the patch earlier than 72 hours if you experience unpleasant side effects which may include dry mouth, dizziness or visual disturbances. 3. Avoid touching the patch. Wash your hands with soap and water after contact with the patch.

## 2018-10-21 NOTE — Anesthesia Postprocedure Evaluation (Signed)
Anesthesia Post Note  Patient: Lauren Osborn  Procedure(s) Performed: RIGHT HEMITHYROIDECTOMY (Right Neck)     Patient location during evaluation: PACU Anesthesia Type: General Level of consciousness: awake and alert Pain management: pain level controlled Vital Signs Assessment: post-procedure vital signs reviewed and stable Respiratory status: spontaneous breathing, nonlabored ventilation, respiratory function stable and patient connected to nasal cannula oxygen Cardiovascular status: blood pressure returned to baseline and stable Postop Assessment: no apparent nausea or vomiting Anesthetic complications: no    Last Vitals:  Vitals:   10/21/18 1300 10/21/18 1315  BP: 130/83 (!) 133/94  Pulse: 85 80  Resp: 16 16  Temp:    SpO2: 93% 95%    Last Pain:  Vitals:   10/21/18 1315  TempSrc:   PainSc: 2                  Sumiko Ceasar L Marisha Renier

## 2018-10-21 NOTE — Anesthesia Procedure Notes (Signed)
Procedure Name: Intubation Date/Time: 10/21/2018 10:53 AM Performed by: Lyndee Leo, CRNA Pre-anesthesia Checklist: Patient identified, Emergency Drugs available, Suction available and Patient being monitored Patient Re-evaluated:Patient Re-evaluated prior to induction Oxygen Delivery Method: Circle system utilized Preoxygenation: Pre-oxygenation with 100% oxygen Induction Type: IV induction Ventilation: Mask ventilation without difficulty Laryngoscope Size: Glidescope Grade View: Grade II Tube type: Oral (nims tube) Tube size: 8.0 mm Number of attempts: 1 Airway Equipment and Method: Stylet,  Oral airway and Patient positioned with wedge pillow Placement Confirmation: ETT inserted through vocal cords under direct vision,  positive ETCO2 and breath sounds checked- equal and bilateral Tube secured with: Tape Dental Injury: Teeth and Oropharynx as per pre-operative assessment

## 2018-10-21 NOTE — Transfer of Care (Signed)
Immediate Anesthesia Transfer of Care Note  Patient: Lauren Osborn  Procedure(s) Performed: RIGHT HEMITHYROIDECTOMY (Right Neck)  Patient Location: PACU  Anesthesia Type:General  Level of Consciousness: sedated and patient cooperative  Airway & Oxygen Therapy: Patient Spontanous Breathing and Patient connected to face mask oxygen  Post-op Assessment: Report given to RN and Post -op Vital signs reviewed and stable  Post vital signs: Reviewed and stable  Last Vitals:  Vitals Value Taken Time  BP    Temp    Pulse 98 10/21/2018 12:23 PM  Resp 11 10/21/2018 12:23 PM  SpO2 97 % 10/21/2018 12:23 PM  Vitals shown include unvalidated device data.  Last Pain:  Vitals:   10/21/18 0859  TempSrc: Oral  PainSc: 0-No pain         Complications: No apparent anesthesia complications

## 2018-10-21 NOTE — Op Note (Signed)
DATE OF PROCEDURE:  10/21/2018                              OPERATIVE REPORT  SURGEON:  Leta Baptist, MD  PREOPERATIVE DIAGNOSES: 1. Right thyroid nodules.  POSTOPERATIVE DIAGNOSES: 1. Right thyroid nodules.  PROCEDURE PERFORMED:  Right total thyroid lobectomy.  ANESTHESIA:  General endotracheal tube anesthesia.  COMPLICATIONS:  None.  ESTIMATED BLOOD LOSS:  Minimal.  INDICATION FOR PROCEDURE:  Lauren Osborn is a 43 y.o. female with a history of an enlarging right thyroid lobe for more than one year.  Her ultrasound examination showed multinodular thyroid goiter, with the largest right thyroid nodule measuring 3.8 cm in diameter. The patient has noted increasing dysphagia with the compression from the large right thyroid lobe. Based on the above findings, the decision was made for the patient to undergo the above-stated procedure. Likelihood of success in reducing symptoms was also discussed.  The risks, benefits, alternatives, and details of the procedure were discussed with the patient.  Questions were invited and answered.  Informed consent was obtained.  DESCRIPTION:  The patient was taken to the operating room and placed supine on the operating table.  General endotracheal tube anesthesia was administered by the anesthesiologist.  The patient was positioned and prepped and draped in a standard fashion for thyroid surgery.  The nerve monitoring endotracheal tube was used. The nerve monitoring system was functional throughout the case.  1% lidocaine with 1-100,000 epinephrine was infiltrated at the planned site of incision. A lower neck transverse incision was made. The incision was carried down past the level of the platysma muscles. Superiorly based and inferiorly based subplatysmal flaps were elevated in a standard fashion. The strap muscles were divided at midline and retracted laterally, exposing the right gland. The right thyroid lobe was severely enlarged. Careful dissection was  performed to free the right thyroid lobe from the surrounding soft tissue. The right recurrent laryngeal nerve was identified and preserved. The nerve was functional throughout the case. A likely right parathyroid gland was identified and preserved. The right thyroid lobe was then resected free from the isthmus. The entire right lobe was sent to the pathology department for permanent histologic identification.  The surgical site was copiously irrigated. A #10 JP drain was placed. The strap muscles were reapproximated and closed with 4-0 Vicryl sutures. The incision was closed in layers with 4-0 Vicryl and Dermabond.  The care of the patient was turned over to the anesthesiologist.  The patient was awakened from anesthesia without difficulty.  The patient was extubated and transferred to the recovery room in good condition.  OPERATIVE FINDINGS:  Enlarged right thyroid lobe with multiple nodules.  SPECIMEN:  Right thyroid lobe.  FOLLOWUP CARE:  The patient will be observed overnight. The patient will follow up in my office in approximately 1 week.  Lauren Osborn 10/21/2018 12:22 PM

## 2018-10-21 NOTE — Anesthesia Preprocedure Evaluation (Addendum)
Anesthesia Evaluation  Patient identified by MRN, date of birth, ID band Patient awake    Reviewed: Allergy & Precautions, NPO status , Patient's Chart, lab work & pertinent test results  Airway Mallampati: II  TM Distance: >3 FB Neck ROM: Full  Mouth opening: Limited Mouth Opening  Dental no notable dental hx. (+) Teeth Intact, Dental Advisory Given   Pulmonary neg pulmonary ROS, former smoker,    Pulmonary exam normal breath sounds clear to auscultation       Cardiovascular negative cardio ROS Normal cardiovascular exam Rhythm:Regular Rate:Normal     Neuro/Psych negative neurological ROS  negative psych ROS   GI/Hepatic negative GI ROS, Neg liver ROS,   Endo/Other  Morbid obesity  Renal/GU negative Renal ROS  negative genitourinary   Musculoskeletal negative musculoskeletal ROS (+)   Abdominal   Peds  Hematology negative hematology ROS (+)   Anesthesia Other Findings Right thyroid nodule  Reproductive/Obstetrics negative OB ROS                            Anesthesia Physical Anesthesia Plan  ASA: II  Anesthesia Plan: General   Post-op Pain Management:    Induction: Intravenous  PONV Risk Score and Plan: 3 and Ondansetron, Dexamethasone and Midazolam  Airway Management Planned: Oral ETT  Additional Equipment:   Intra-op Plan:   Post-operative Plan: Extubation in OR  Informed Consent: I have reviewed the patients History and Physical, chart, labs and discussed the procedure including the risks, benefits and alternatives for the proposed anesthesia with the patient or authorized representative who has indicated his/her understanding and acceptance.     Dental advisory given  Plan Discussed with: CRNA  Anesthesia Plan Comments:         Anesthesia Quick Evaluation

## 2018-10-22 DIAGNOSIS — E042 Nontoxic multinodular goiter: Secondary | ICD-10-CM | POA: Diagnosis not present

## 2018-10-22 NOTE — Discharge Summary (Signed)
Physician Discharge Summary  Patient ID: DIAMON REDDINGER MRN: [id number] DOB/AGE: 01-23-1976 43 y.o.  Admit date: 10/21/2018 Discharge date: 10/22/2018  Admission Diagnoses: Right thyroid nodules  Discharge Diagnoses: Right thyroid nodules Active Problems:   S/P partial thyroidectomy   Discharged Condition: good  Hospital Course: Pt had an uneventful overnight stay. Pt tolerated po well. No bleeding. No stridor. Voice is strong.  Consults: None  Significant Diagnostic Studies: None  Treatments: surgery: Right hemithyroidectomy  Discharge Exam: Blood pressure 127/79, pulse 85, temperature 98.3 F (36.8 C), resp. rate 18, height 5\' 3"  (1.6 m), weight 126.5 kg, SpO2 97 %. Incision/Wound:c/d/i  Disposition: Discharge disposition: 01-Home or Self Care       Discharge Instructions    Activity as tolerated - No restrictions   Complete by:  As directed    Diet general   Complete by:  As directed      Allergies as of 10/22/2018      Reactions   Percocet [oxycodone-acetaminophen] Hives      Medication List    TAKE these medications   amoxicillin 875 MG tablet Commonly known as:  AMOXIL Take 1 tablet (875 mg total) by mouth 2 (two) times daily for 5 days.   HYDROcodone-acetaminophen 5-325 MG tablet Commonly known as:  NORCO/VICODIN Take 1 tablet by mouth every 4 (four) hours as needed for severe pain.   TART CHERRY ADVANCED PO Take by mouth.      Follow-up Information    Leta Baptist, MD.   Specialty:  Otolaryngology Why:  As scheduled Contact information: [address] [place] [address] [phone]           Signed: Burley Saver 10/22/2018, 7:29 AM

## 2018-10-23 ENCOUNTER — Encounter (HOSPITAL_BASED_OUTPATIENT_CLINIC_OR_DEPARTMENT_OTHER): Payer: Self-pay | Admitting: Otolaryngology

## 2018-10-28 ENCOUNTER — Ambulatory Visit (INDEPENDENT_AMBULATORY_CARE_PROVIDER_SITE_OTHER): Payer: Medicaid Other | Admitting: Otolaryngology

## 2019-01-29 DIAGNOSIS — I10 Essential (primary) hypertension: Secondary | ICD-10-CM | POA: Diagnosis not present

## 2019-01-29 DIAGNOSIS — L255 Unspecified contact dermatitis due to plants, except food: Secondary | ICD-10-CM | POA: Diagnosis not present

## 2019-04-28 ENCOUNTER — Ambulatory Visit (INDEPENDENT_AMBULATORY_CARE_PROVIDER_SITE_OTHER): Payer: Self-pay | Admitting: Otolaryngology

## 2019-06-09 ENCOUNTER — Other Ambulatory Visit (HOSPITAL_COMMUNITY): Payer: Self-pay | Admitting: General Practice

## 2019-06-09 DIAGNOSIS — Z1231 Encounter for screening mammogram for malignant neoplasm of breast: Secondary | ICD-10-CM

## 2019-06-27 ENCOUNTER — Ambulatory Visit (HOSPITAL_COMMUNITY)
Admission: RE | Admit: 2019-06-27 | Discharge: 2019-06-27 | Disposition: A | Discharge status: Home / Self Care | Payer: Medicaid Other | Source: Ambulatory Visit | Attending: General Practice | Admitting: General Practice

## 2019-06-27 ENCOUNTER — Other Ambulatory Visit: Payer: Self-pay

## 2019-06-27 ENCOUNTER — Encounter (HOSPITAL_COMMUNITY): Payer: Self-pay

## 2019-06-27 DIAGNOSIS — Z1231 Encounter for screening mammogram for malignant neoplasm of breast: Secondary | ICD-10-CM | POA: Diagnosis not present

## 2019-06-27 IMAGING — MG DIGITAL SCREENING BILAT W/ TOMO W/ CAD
8 of 13 series · 8 of 37 positions shown · non-contrast
Comparison: None.

ACR Breast Density Category a: The breast tissue is almost entirely
fatty.

CLINICAL DATA: Screening.

EXAM:
DIGITAL SCREENING BILATERAL MAMMOGRAM WITH TOMO AND CAD

[R CC]
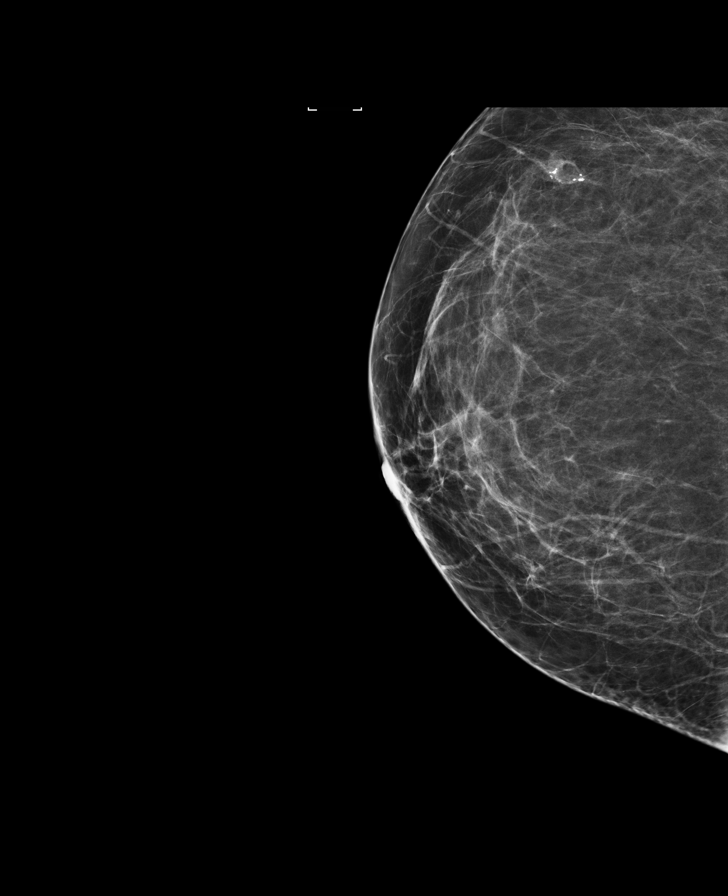

[R CV synth-2D]
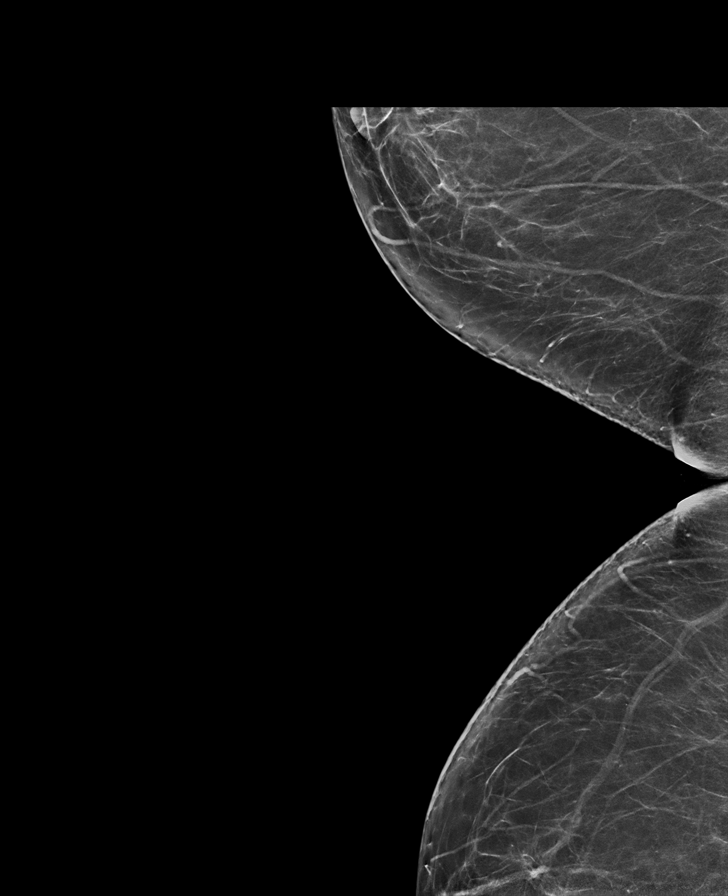

[L CC synth-2D]
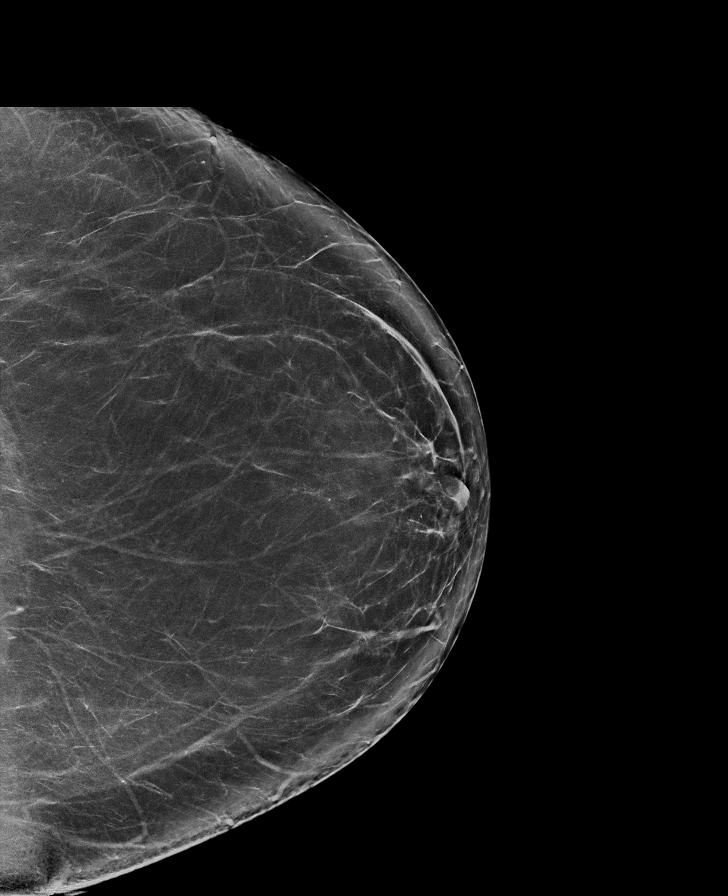

[L MLO synth-2D (1 of 2)]
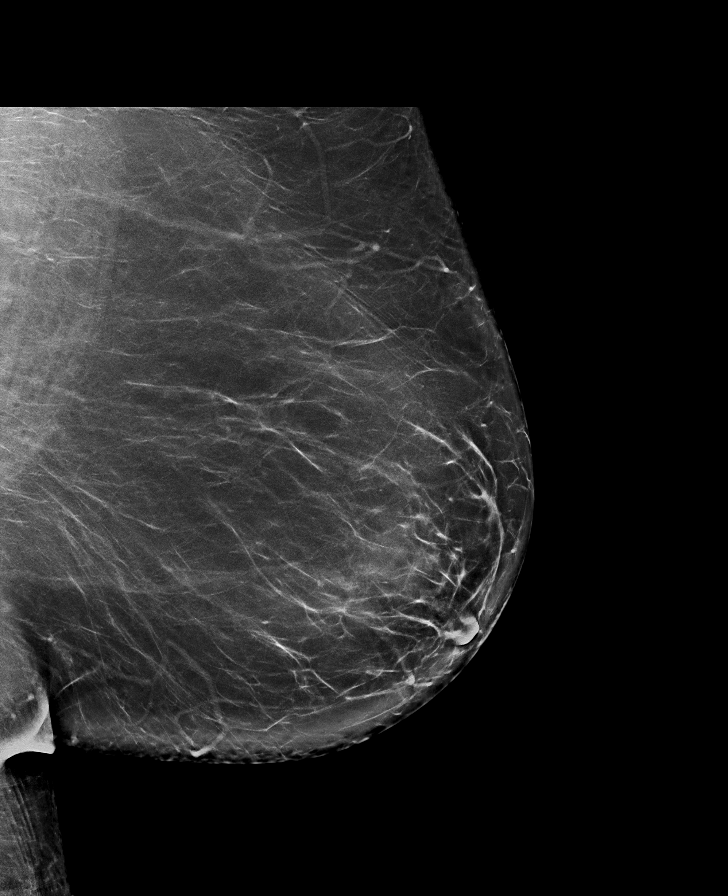

[R MLO synth-2D]
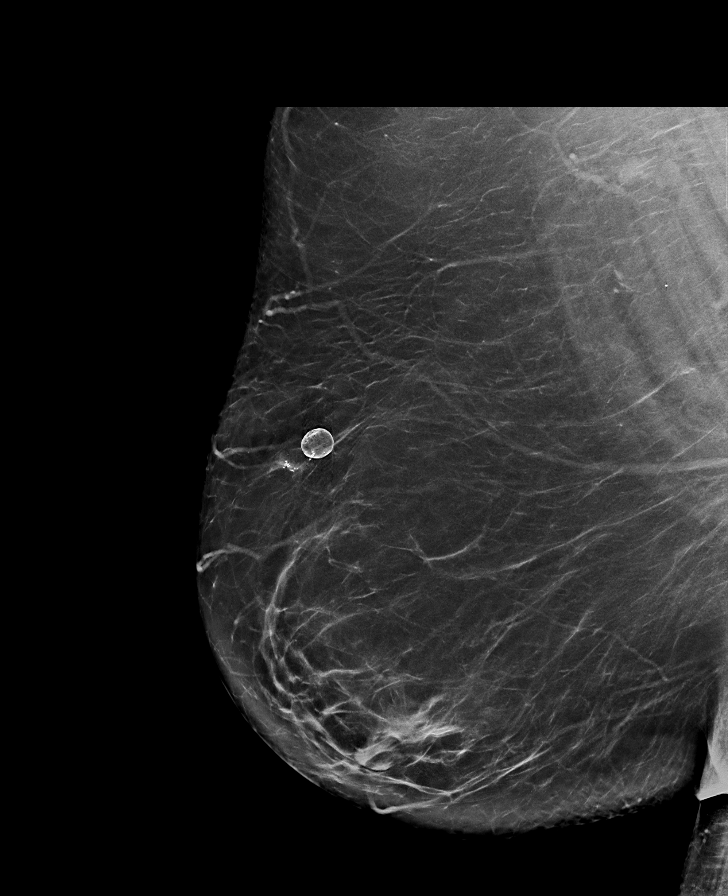

[R CC synth-2D]
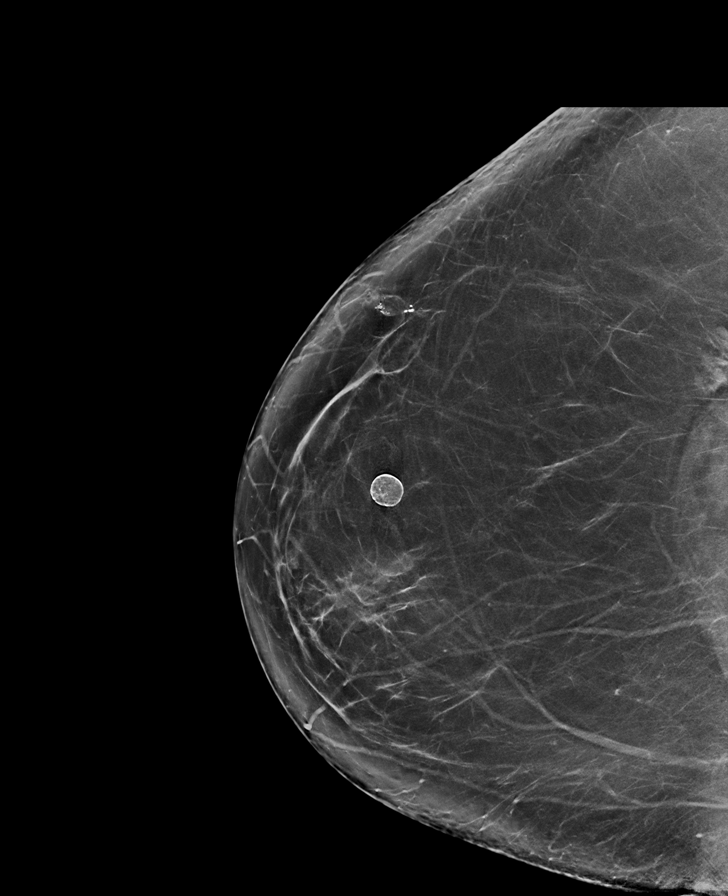

[L MLO synth-2D (2 of 2)]
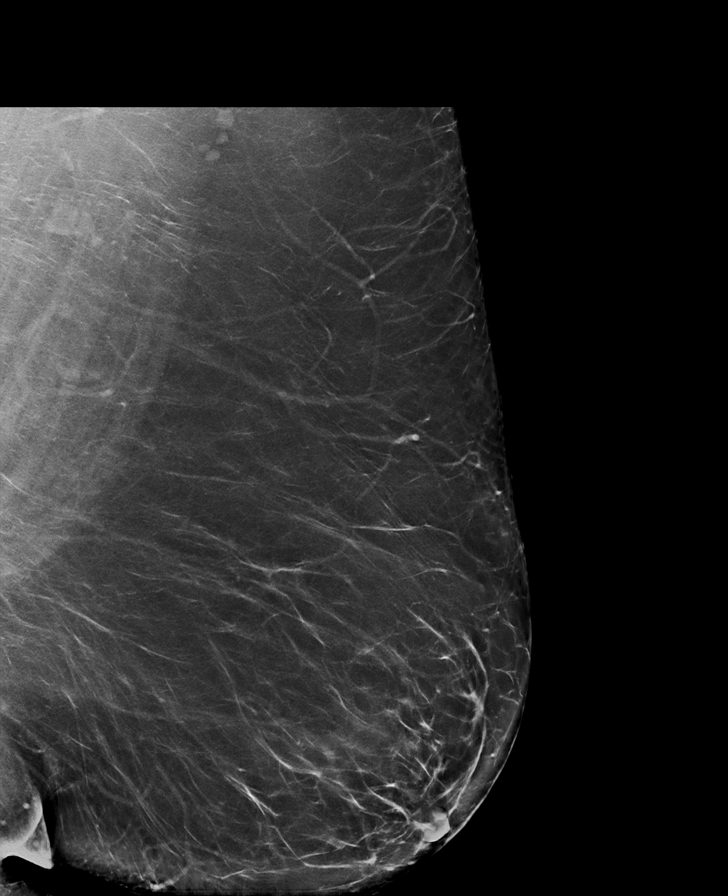

[R CV tomo · tomo slice 37/74.0]
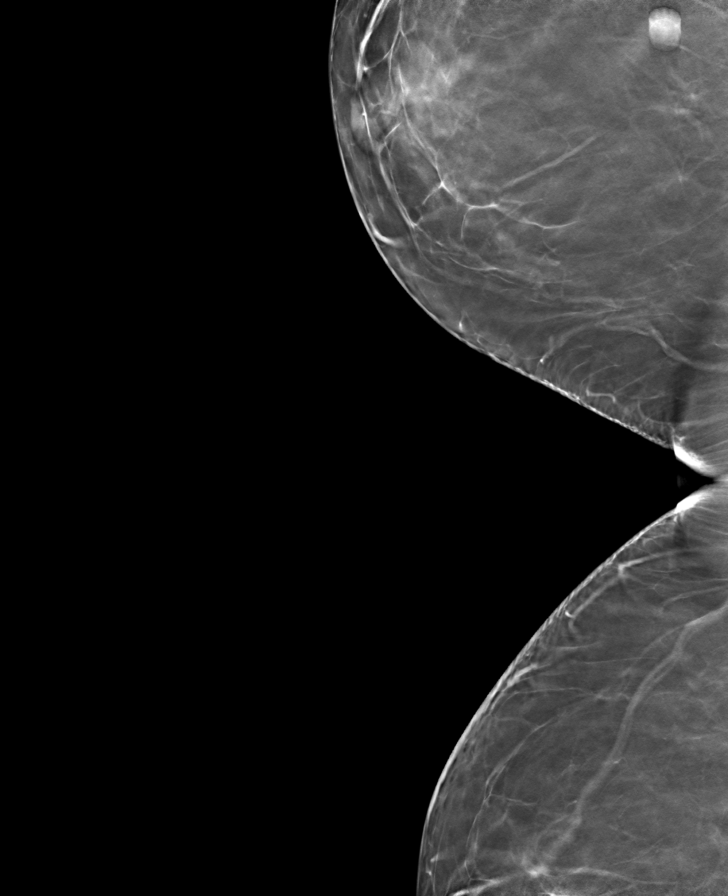

[8 of 37 positions shown; findings below may reference images not displayed]

FINDINGS: There are no findings suspicious for malignancy. Images were
processed with CAD.
IMPRESSION: No mammographic evidence of malignancy. A result letter of this
screening mammogram will be mailed directly to the patient.

RECOMMENDATION:
Screening mammogram in one year. (Code:[09])

BI-RADS CATEGORY  1: Negative.

## 2019-08-04 ENCOUNTER — Encounter (HOSPITAL_COMMUNITY): Payer: Self-pay | Admitting: Emergency Medicine

## 2019-08-04 ENCOUNTER — Emergency Department (HOSPITAL_COMMUNITY)
Admission: EM | Admit: 2019-08-04 | Discharge: 2019-08-04 | Disposition: A | Discharge status: Home / Self Care | Payer: Managed Care, Other (non HMO) | Attending: Emergency Medicine | Admitting: Emergency Medicine

## 2019-08-04 ENCOUNTER — Other Ambulatory Visit: Payer: Self-pay

## 2019-08-04 ENCOUNTER — Emergency Department (HOSPITAL_COMMUNITY): Payer: Managed Care, Other (non HMO)

## 2019-08-04 DIAGNOSIS — R600 Localized edema: Secondary | ICD-10-CM

## 2019-08-04 DIAGNOSIS — E876 Hypokalemia: Secondary | ICD-10-CM | POA: Diagnosis not present

## 2019-08-04 DIAGNOSIS — R609 Edema, unspecified: Secondary | ICD-10-CM | POA: Insufficient documentation

## 2019-08-04 DIAGNOSIS — R2243 Localized swelling, mass and lump, lower limb, bilateral: Secondary | ICD-10-CM | POA: Diagnosis present

## 2019-08-04 DIAGNOSIS — R030 Elevated blood-pressure reading, without diagnosis of hypertension: Secondary | ICD-10-CM | POA: Insufficient documentation

## 2019-08-04 DIAGNOSIS — Z79899 Other long term (current) drug therapy: Secondary | ICD-10-CM | POA: Diagnosis not present

## 2019-08-04 DIAGNOSIS — Z87891 Personal history of nicotine dependence: Secondary | ICD-10-CM | POA: Insufficient documentation

## 2019-08-04 DIAGNOSIS — J811 Chronic pulmonary edema: Secondary | ICD-10-CM | POA: Diagnosis not present

## 2019-08-04 DIAGNOSIS — I10 Essential (primary) hypertension: Secondary | ICD-10-CM | POA: Diagnosis not present

## 2019-08-04 LAB — CBC
HCT: 45.7 % (ref 36.0–46.0)
Hemoglobin: 14.8 g/dL (ref 12.0–15.0)
MCH: 29.1 pg (ref 26.0–34.0)
MCHC: 32.4 g/dL (ref 30.0–36.0)
MCV: 89.8 fL (ref 80.0–100.0)
Platelets: 272 10*3/uL (ref 150–400)
RBC: 5.09 MIL/uL (ref 3.87–5.11)
RDW: 12.3 % (ref 11.5–15.5)
WBC: 6.4 10*3/uL (ref 4.0–10.5)
nRBC: 0 % (ref 0.0–0.2)

## 2019-08-04 LAB — BRAIN NATRIURETIC PEPTIDE: B Natriuretic Peptide: 37 pg/mL (ref 0.0–100.0)

## 2019-08-04 LAB — BASIC METABOLIC PANEL
Anion gap: 12 (ref 5–15)
BUN: 12 mg/dL (ref 6–20)
CO2: 25 mmol/L (ref 22–32)
Calcium: 9.7 mg/dL (ref 8.9–10.3)
Chloride: 99 mmol/L (ref 98–111)
Creatinine, Ser: 0.64 mg/dL (ref 0.44–1.00)
GFR calc Af Amer: >60 mL/min (ref >60)
GFR calc non Af Amer: >60 mL/min (ref >60)
Glucose, Bld: 106 mg/dL — ABNORMAL HIGH (ref 70–99)
Potassium: 2.9 mmol/L — ABNORMAL LOW (ref 3.5–5.1)
Sodium: 136 mmol/L (ref 135–145)

## 2019-08-04 MED ORDER — POTASSIUM CHLORIDE ER 20 MEQ PO TBCR: 20.0000 meq | EXTENDED_RELEASE_TABLET | Freq: Every day | ORAL | 0 refills | Status: DC

## 2019-08-04 MED ORDER — POTASSIUM CHLORIDE CRYS ER 20 MEQ PO TBCR
40.0000 meq | EXTENDED_RELEASE_TABLET | Freq: Once | ORAL | Status: AC
  Administered 2019-08-04: 23:00:00 40 meq via ORAL
  Filled 2019-08-04: qty 2

## 2019-08-04 NOTE — ED Provider Notes (Signed)
[facility] EMERGENCY DEPARTMENT Provider Note   CSN: [id number] Arrival date & time: 08/04/19  1753     History   Chief Complaint Chief Complaint  Patient presents with  . Leg Swelling    HPI Lauren Osborn is a 43 y.o. female with a past medical history significant for gout and thyroid nodule who presents to the ED for an evaluation of bilateral lower extremity edema for the past month.  Patient was sent here by her PCP to be evaluated for a DVT.  Patient notes her left lower extremity has been much more swollen than her right. Her swelling was intermittent a few weeks ago, but over past 2 weeks the swelling has not gone down and has worsened. Lower extremity edema is associated with an achy feeling in her left leg. Patient denies history of blood clots, recent surgeries, recent immobilizations, and hemoptysis.  Patient denies shortness of breath and chest pain.  Patient is not on any hormonal treatments.  Patient denies any cardiac history. Patient denies abdominal pain and headaches.   Past Medical History:  Diagnosis Date  . Fibroids   . Gout   . Thyroid nodule     Patient Active Problem List   Diagnosis Date Noted  . S/P partial thyroidectomy 10/21/2018  . Visit for routine gyn exam 07/03/2018    Past Surgical History:  Procedure Laterality Date  . HYSTERECTOMY ABDOMINAL WITH SALPINGECTOMY    . THYROIDECTOMY Right 10/21/2018   Procedure: RIGHT HEMITHYROIDECTOMY;  Surgeon: Leta Baptist, MD;  Location: [facility];  Service: ENT;  Laterality: Right;     OB History    Gravida  2   Para  1   Term  1   Preterm      AB  1   Living  1     SAB      TAB      Ectopic      Multiple      Live Births               Home Medications    Prior to Admission medications   Medication Sig Start Date End Date Taking? Authorizing Provider  HYDROcodone-acetaminophen (NORCO/VICODIN) 5-325 MG tablet Take 1 tablet by mouth every 4 (four) hours as needed  for severe pain. 10/21/18   Leta Baptist, MD  Misc Natural Products (TART CHERRY ADVANCED PO) Take by mouth.    [provider]  potassium chloride 20 MEQ TBCR Take 20 mEq by mouth daily. 08/04/19   Jonette Eva, PA-C    Family History Family History  Problem Relation Age of Onset  . Breast cancer Mother     Social History Social History   Tobacco Use  . Smoking status: Former Research scientist (life sciences)  . Smokeless tobacco: Never Used  Substance Use Topics  . Alcohol use: Never    Frequency: Never  . Drug use: Never     Allergies   Percocet [oxycodone-acetaminophen]   Review of Systems Review of Systems  Constitutional: Negative for chills and fever.  Respiratory: Negative for cough and shortness of breath.   Cardiovascular: Positive for leg swelling. Negative for chest pain.  Neurological: Negative for weakness, numbness and headaches.  All other systems reviewed and are negative.    Physical Exam Updated Vital Signs BP (!) 146/76   Pulse 79   Temp 98 F (36.7 C) (Oral)   Resp 16   Ht 5\' 3"  (1.6 m)   Wt 132.9 kg  SpO2 97%   BMI 51.88 kg/m   Physical Exam Vitals signs and nursing note reviewed.  Constitutional:      General: She is not in acute distress. HENT:     Head: Normocephalic.  Eyes:     Conjunctiva/sclera: Conjunctivae normal.  Neck:     Musculoskeletal: Neck supple.  Cardiovascular:     Rate and Rhythm: Normal rate and regular rhythm.     Pulses: Normal pulses.     Heart sounds: Normal heart sounds. No murmur. No friction rub. No gallop.   Pulmonary:     Effort: Pulmonary effort is normal.     Breath sounds: Normal breath sounds.  Abdominal:     General: Abdomen is flat. Bowel sounds are normal. There is no distension.     Palpations: Abdomen is soft.     Tenderness: There is no abdominal tenderness. There is no guarding or rebound.  Musculoskeletal:     Comments: 2+ pitting edema bilaterally, worse on the left. No calf tenderness. Distal  pulses and sensation intact bilaterality.   Skin:    General: Skin is warm and dry.  Neurological:     General: No focal deficit present.      ED Treatments / Results  Labs (all labs ordered are listed, but only abnormal results are displayed) Labs Reviewed  BASIC METABOLIC PANEL - Abnormal; Notable for the following components:      Result Value   Potassium 2.9 (*)    Glucose, Bld 106 (*)    All other components within normal limits  CBC  BRAIN NATRIURETIC PEPTIDE    EKG None  Radiology Dg Chest 2 View  Result Date: 08/04/2019 CLINICAL DATA:  43 year old female with hypertension and edema. EXAM: CHEST - 2 VIEW COMPARISON:  None. FINDINGS: No focal consolidation, pleural effusion, or pneumothorax. No vascular congestion or edema. The cardiac silhouette is within normal limits. No acute osseous pathology. IMPRESSION: No active cardiopulmonary disease. Electronically Signed   By: Anner Crete M.D.   On: 08/04/2019 21:24    Procedures Procedures (including critical care time)  Medications Ordered in ED Medications  potassium chloride SA (KLOR-CON) CR tablet 40 mEq (40 mEq Oral Given 08/04/19 2254)     Initial Impression / Assessment and Plan / ED Course  I have reviewed the triage vital signs and the nursing notes.  Pertinent labs & imaging results that were available during my care of the patient were reviewed by me and considered in my medical decision making (see chart for details).       43 year old female presents to ED for evaluation of bilateral lower extremity edema, worse on the left. Patient was sent by PCP to rule out DVT. Patient is afebrile, not tachycardic or hypoxic. Patient in no acute distress and rather well appearing. 2+ pitting edema bilaterally, but worse on left. Neurovascularly intact. PERC negative and low risk using Well's criteria. Low risk for DVT/PE. Suspect lower extremity edema related to patient's weight and venous insuffiencey. Labs  and CXR ordered at triage. CXR personally reviewed which is negative for infection and signs of fluid overload. BNP normal, low suspicion for congestive heart failure. BMP significant for hypokalemia at 2.9. Will replete potassium. CBC unremarkable with no leukocytosis or other abnormalities. Venous US ordered for tomorrow morning to rule out DVT. Patient has been given instructions on who to call in the morning to schedule her appointment. Patient will be discharged with potassium x 1 week given her hypokalemia today. Strict  ED precautions discussed with patient. Patient states understanding and agrees to plan. Patient discharged home in no acute distress and stable vitals   Final Clinical Impressions(s) / ED Diagnoses   Final diagnoses:  Leg edema  Hypokalemia  Elevated blood pressure reading    ED Discharge Orders         Ordered    potassium chloride 20 MEQ TBCR  Daily     08/04/19 2254    US Venous Img Lower Unilateral Left     08/04/19 2302           Romie Levee 08/05/19 0238    Varney Biles, MD 08/07/19 [phone]

## 2019-08-04 NOTE — Discharge Instructions (Addendum)
All of your labs looked good today except for low potassium. You were given potassium in the hospital today and I am sending you home with a prescription for daily potassium for the next week. Please call tomorrow morning to schedule an ultrasound to rule out a blood clot. The number to call is in the packet. Follow-up with your PCP within the next week to recheck your blood pressure. Return to the ER for new or worsening symptoms.

## 2019-08-04 NOTE — ED Triage Notes (Signed)
Pt states she has been having progressive leg swelling over the past month. Edema extends up to knee. Denies sob. PCP sent for dvt eval.

## 2019-08-06 ENCOUNTER — Other Ambulatory Visit (HOSPITAL_COMMUNITY): Payer: Self-pay | Admitting: General Practice

## 2019-08-06 ENCOUNTER — Other Ambulatory Visit: Payer: Self-pay | Admitting: General Practice

## 2019-08-06 DIAGNOSIS — M7989 Other specified soft tissue disorders: Secondary | ICD-10-CM

## 2019-08-11 ENCOUNTER — Other Ambulatory Visit: Payer: Self-pay

## 2019-08-11 ENCOUNTER — Ambulatory Visit (HOSPITAL_COMMUNITY)
Admission: RE | Admit: 2019-08-11 | Discharge: 2019-08-11 | Disposition: A | Discharge status: Home / Self Care | Payer: Managed Care, Other (non HMO) | Source: Ambulatory Visit | Attending: General Practice | Admitting: General Practice

## 2019-08-11 DIAGNOSIS — M79605 Pain in left leg: Secondary | ICD-10-CM | POA: Diagnosis not present

## 2019-08-11 DIAGNOSIS — M7989 Other specified soft tissue disorders: Secondary | ICD-10-CM | POA: Insufficient documentation

## 2019-08-11 IMAGING — US US EXTREM LOW VENOUS*L*
1 series · 13 of 24 positions shown · non-contrast
Comparison: None.

CLINICAL DATA: Left leg pain and swelling for 3 days



[Series 1: us extrem low venous*left* · 13 of 38 slices shown]
[im 1/38]
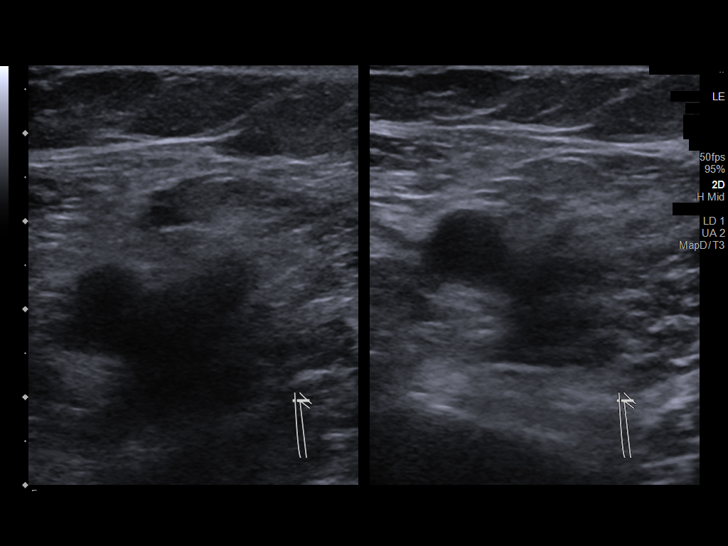
[im 4/38]
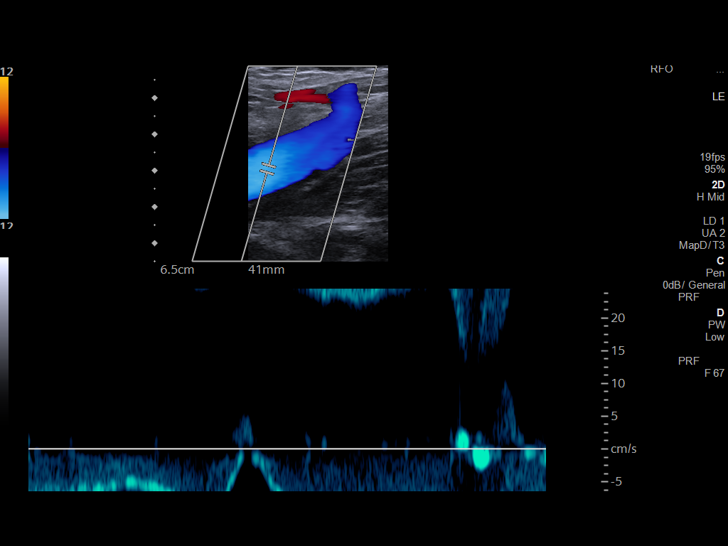
[im 7/38]
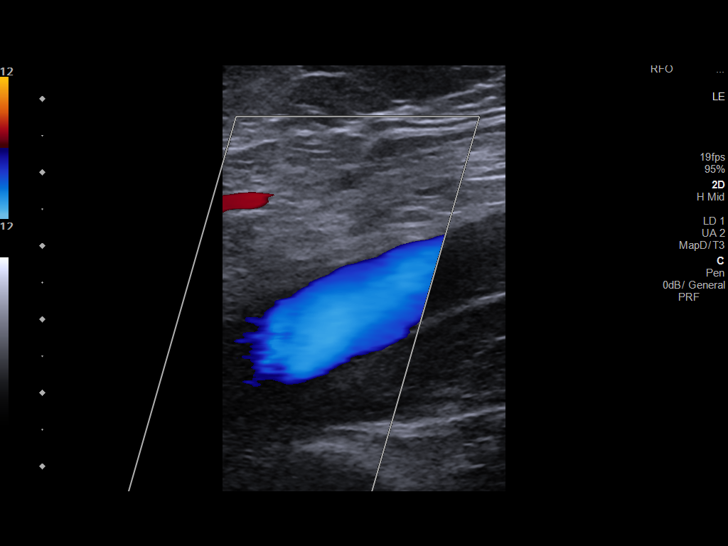
[im 10/38]
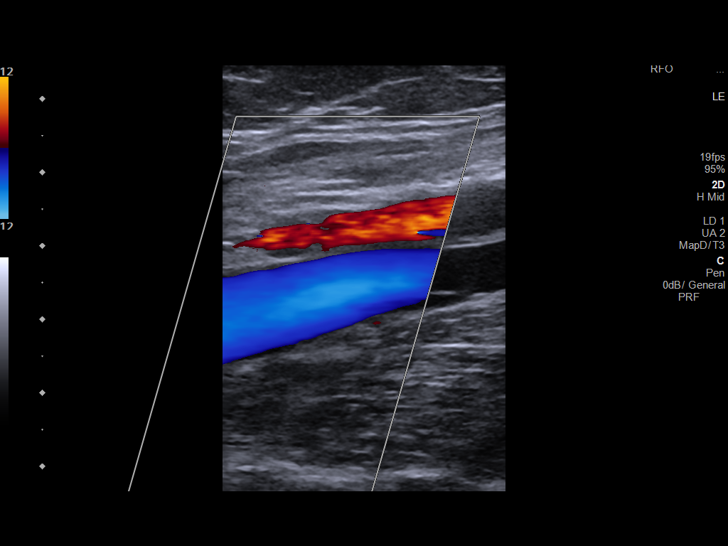
[im 13/38]
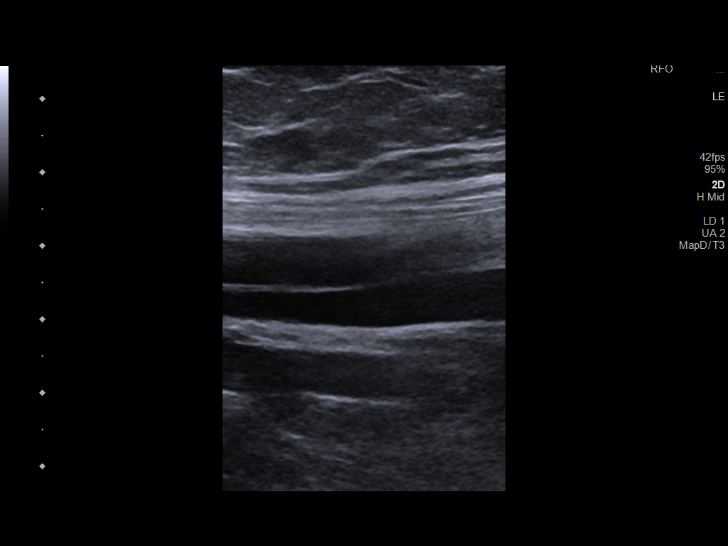
[im 17/38]
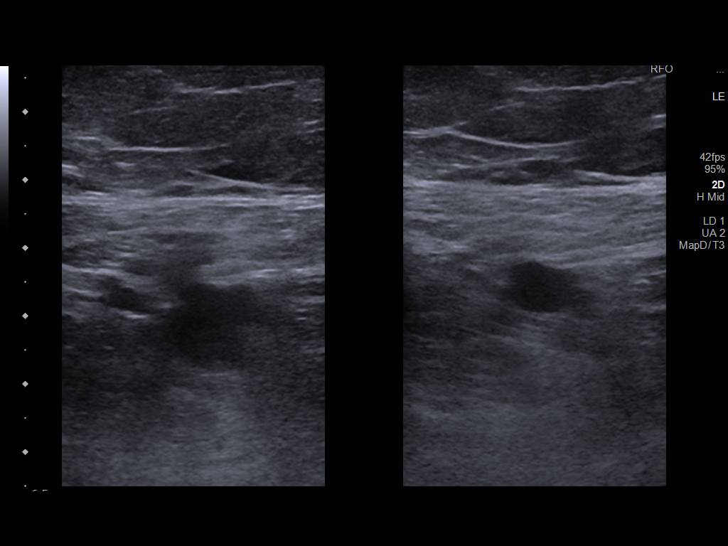
[im 20/38]
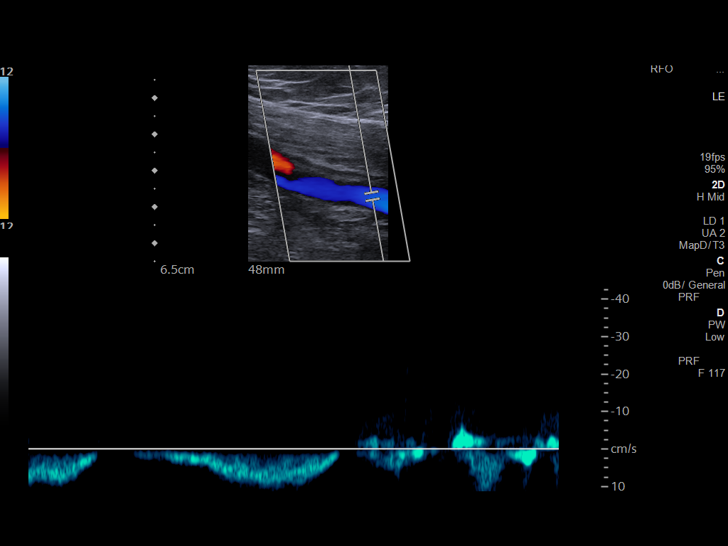
[im 21/38]
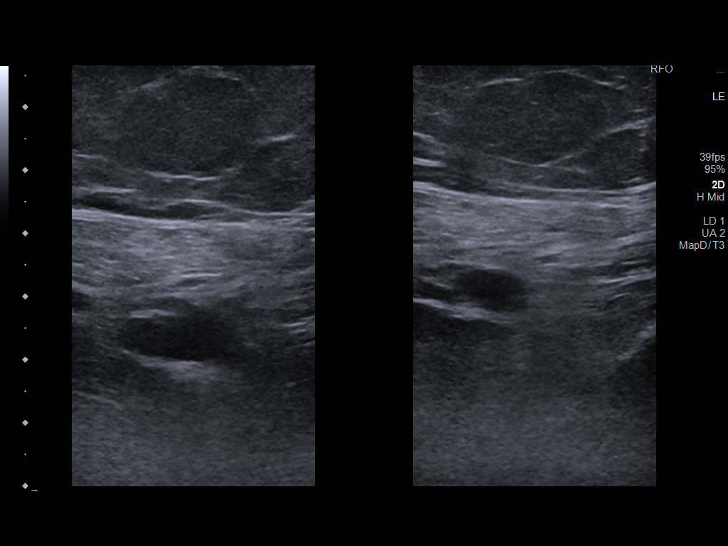
[im 25/38]
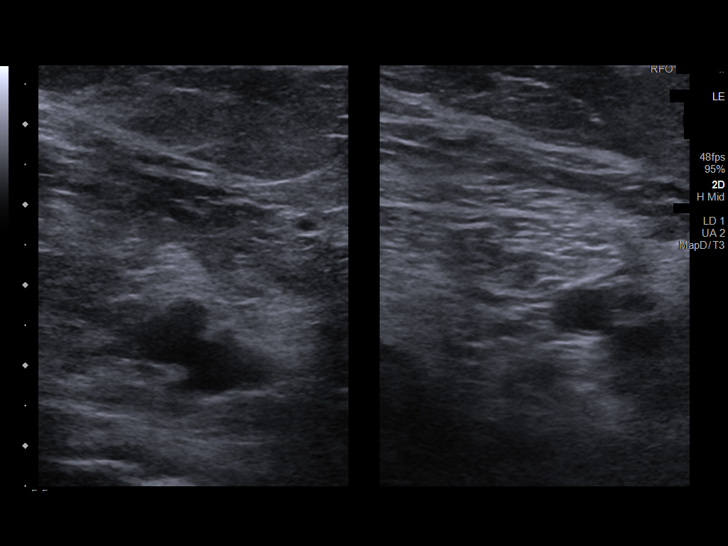
[im 28/38]
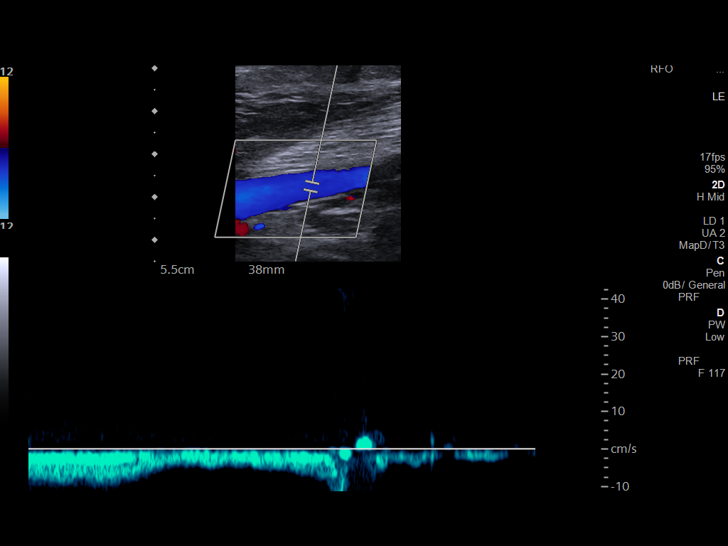
[im 31/38]
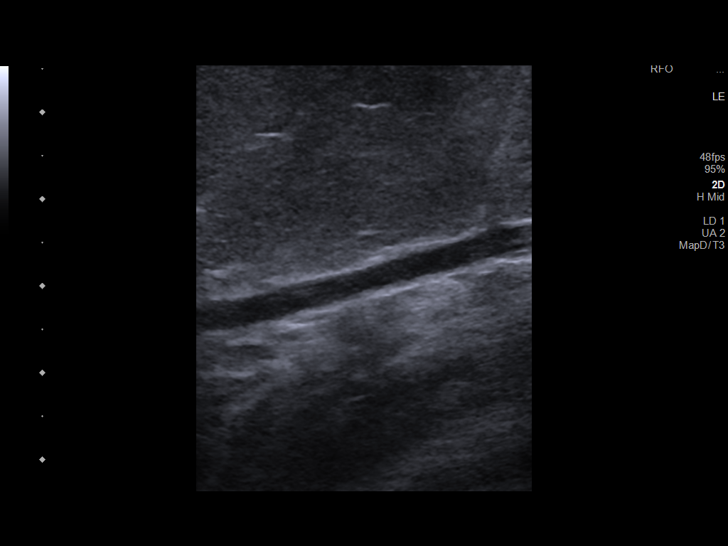
[im 34/38]
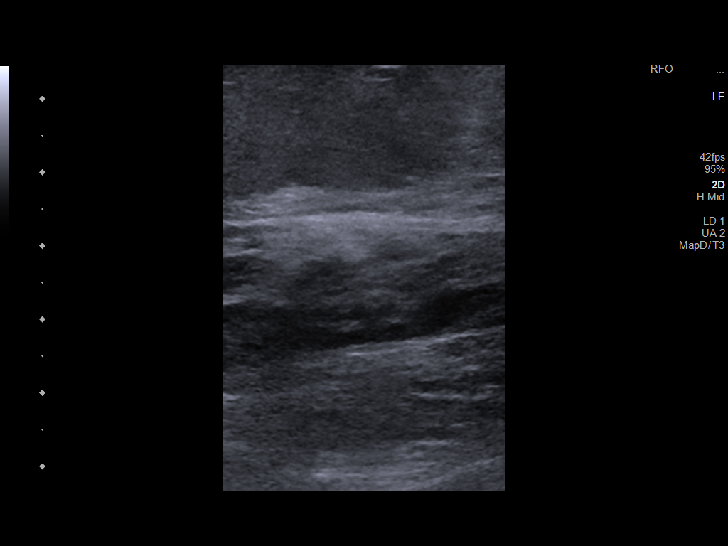
[im 38/38]
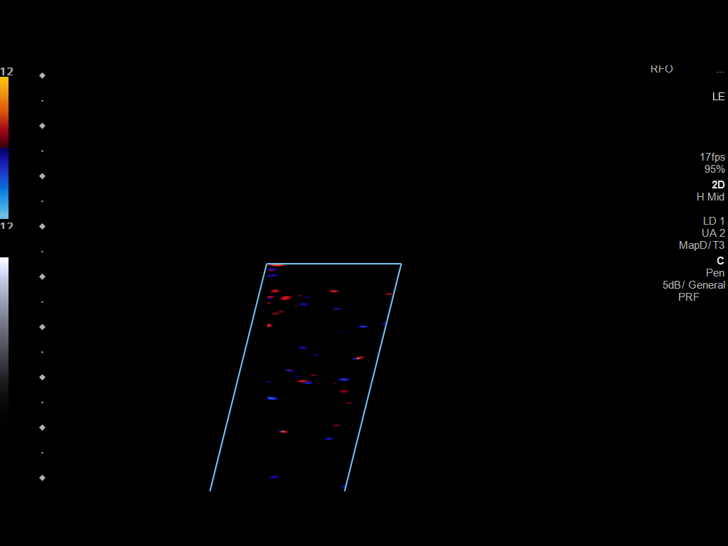

[13 of 24 positions shown; findings below may reference images not displayed]

FINDINGS: Contralateral Common Femoral Vein: Respiratory phasicity is normal
and symmetric with the symptomatic side. No evidence of thrombus.
Normal compressibility.

Common Femoral Vein: No evidence of thrombus. Normal
compressibility, respiratory phasicity and response to augmentation.

Saphenofemoral Junction: No evidence of thrombus. Normal
compressibility and flow on color Doppler imaging.

Profunda Femoral Vein: No evidence of thrombus. Normal
compressibility and flow on color Doppler imaging.

Femoral Vein: No evidence of thrombus. Normal compressibility,
respiratory phasicity and response to augmentation.

Popliteal Vein: No evidence of thrombus. Normal compressibility,
respiratory phasicity and response to augmentation.

Calf Veins: No evidence of thrombus. Normal compressibility and flow
on color Doppler imaging.

Other Findings:  None.
IMPRESSION: No evidence of deep venous thrombosis.

## 2019-09-01 DIAGNOSIS — I10 Essential (primary) hypertension: Secondary | ICD-10-CM | POA: Diagnosis not present

## 2019-11-03 ENCOUNTER — Telehealth: Payer: Self-pay | Admitting: Adult Health

## 2019-11-03 NOTE — Telephone Encounter (Signed)

## 2019-11-04 ENCOUNTER — Other Ambulatory Visit: Payer: Self-pay

## 2019-11-04 ENCOUNTER — Ambulatory Visit (INDEPENDENT_AMBULATORY_CARE_PROVIDER_SITE_OTHER): Payer: 59 | Admitting: Adult Health

## 2019-11-04 ENCOUNTER — Encounter: Payer: Self-pay | Admitting: Adult Health

## 2019-11-04 ENCOUNTER — Encounter: Payer: Managed Care, Other (non HMO) | Admitting: Adult Health

## 2019-11-04 VITALS — BP 126/96 | HR 76 | Ht 63.0 in | Wt 292.0 lb

## 2019-11-04 DIAGNOSIS — N6311 Unspecified lump in the right breast, upper outer quadrant: Secondary | ICD-10-CM

## 2019-11-04 NOTE — Progress Notes (Signed)
  Subjective:     Patient ID: Lauren Osborn, female   DOB: 08-03-1976, 44 y.o.   MRN: [id number]  HPI Lauren Osborn is a 56 year year old white female,single, sp hysterectomy in complaining of lump in right breast for about 2 weeks now, was tender, but not as much now. She had normal mammogram 06/27/19. PCP is [facility].  Review of Systems Right breast mass x about 2 weeks Was tender but not has much now.  Reviewed past medical,surgical, social and family history. Reviewed medications and allergies.     Objective:   Physical Exam BP (!) 126/96 (BP Location: Left Arm, Patient Position: Sitting, Cuff Size: Normal)   Pulse 76   Ht 5\' 3"  (1.6 m)   Wt 292 lb (132.5 kg)   BMI 51.73 kg/m    Skin warm and dry,  Breasts:no dominate palpable mass, retraction or nipple discharge on the left, on the right, no retraction or nipple discharge, but has marble sized firm mass at 10-11 0' clock about 6 FB from areola that is non mobile, and not tender today, co exam with Terri Skains NP student.    Assessment:     1. Mass of upper outer quadrant of right breast Right diagnostic mammogram and [place] 3/23 at 10:20 am at [facility]:     Follow up prn

## 2019-11-25 ENCOUNTER — Ambulatory Visit (HOSPITAL_COMMUNITY)
Admission: RE | Admit: 2019-11-25 | Discharge: 2019-11-25 | Disposition: A | Discharge status: Home / Self Care | Payer: 59 | Source: Ambulatory Visit | Attending: Adult Health | Admitting: Adult Health

## 2019-11-25 ENCOUNTER — Telehealth: Payer: Self-pay | Admitting: Adult Health

## 2019-11-25 ENCOUNTER — Other Ambulatory Visit: Payer: Self-pay

## 2019-11-25 DIAGNOSIS — N6311 Unspecified lump in the right breast, upper outer quadrant: Secondary | ICD-10-CM | POA: Diagnosis not present

## 2019-11-25 DIAGNOSIS — R928 Other abnormal and inconclusive findings on diagnostic imaging of breast: Secondary | ICD-10-CM | POA: Diagnosis not present

## 2019-11-25 IMAGING — US US BREAST*R* LIMITED INC AXILLA
1 series · 13 of 13 positions shown · non-contrast
Comparison: Previous exam(s).

CLINICAL DATA: 43-year-old female with a palpable area of concern
in the right breast. Patient states she has had a palpable
abnormality in the upper-outer right breast for many years, however
in [REDACTED] she felt like this area was much larger but has
substantially decreased in size over the past month.

EXAM:
DIGITAL DIAGNOSTIC UNILATERAL RIGHT MAMMOGRAM WITH CAD AND TOMO
RIGHT BREAST ULTRASOUND

[Series 1: us breast*right* limited inc axilla · 0.07mm/px · 13 of 13 slices shown]
[im 1/13]
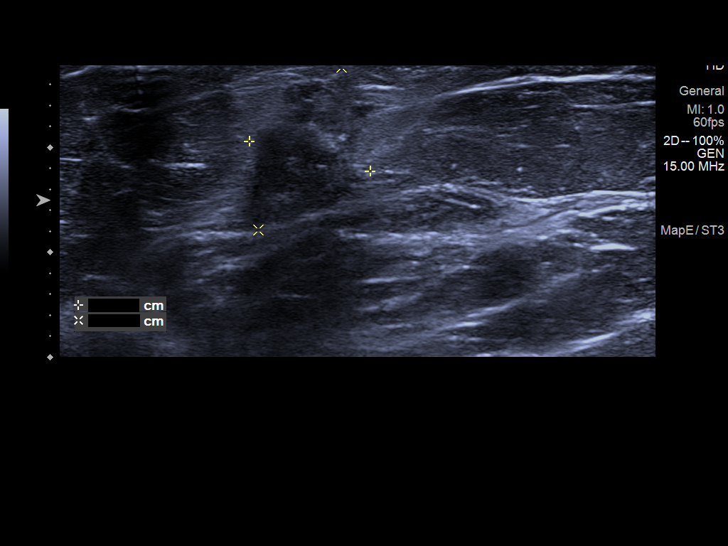
[im 2/13]
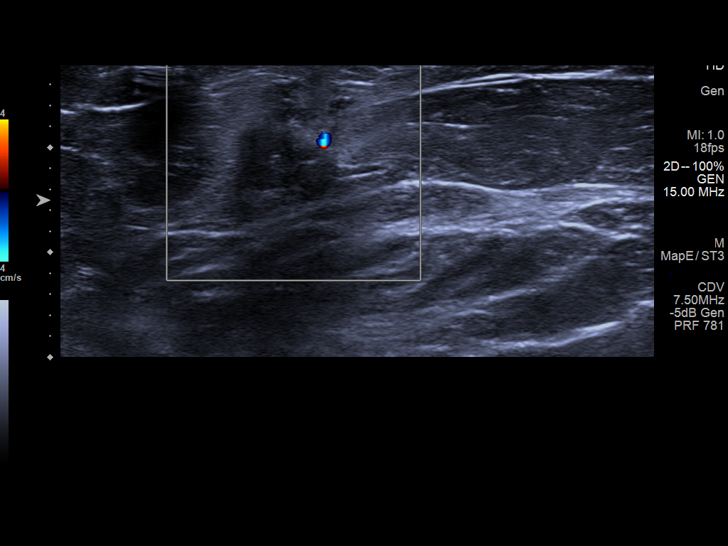
[im 3/13]
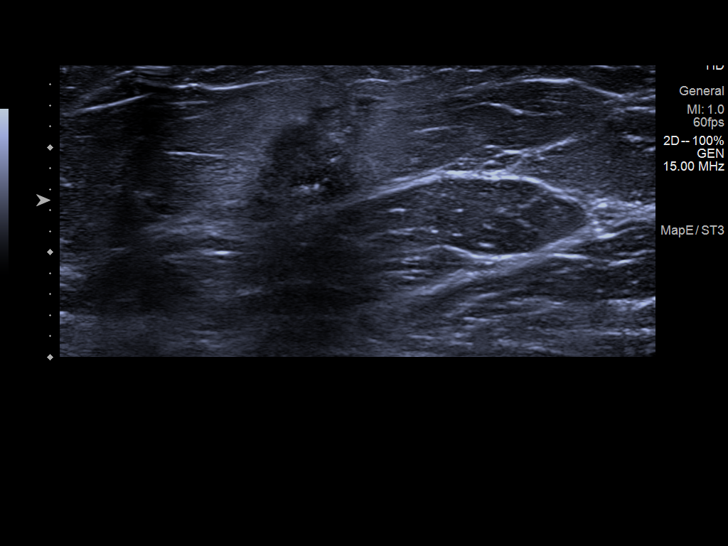
[im 4/13]
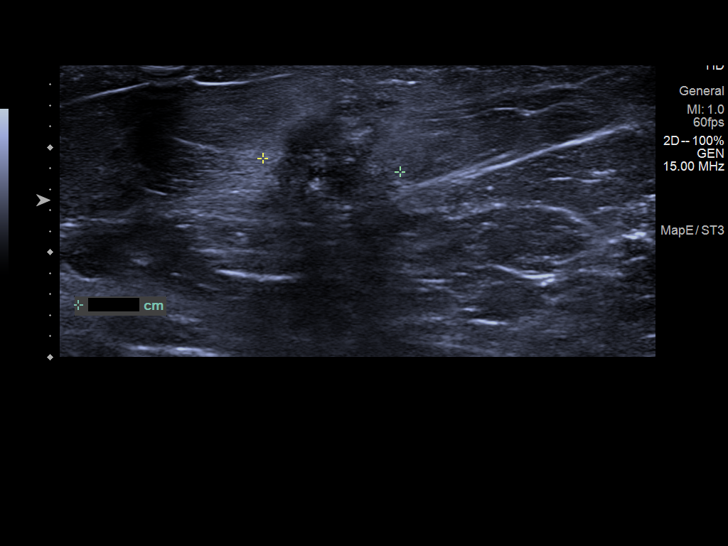
[im 5/13]
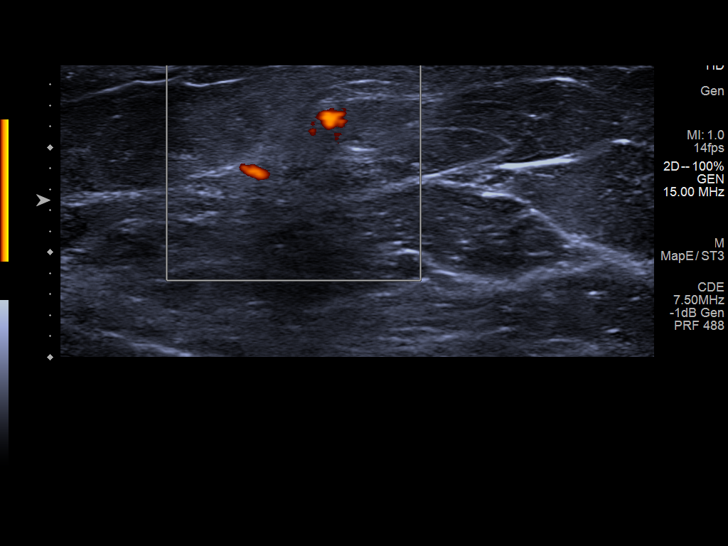
[im 6/13]
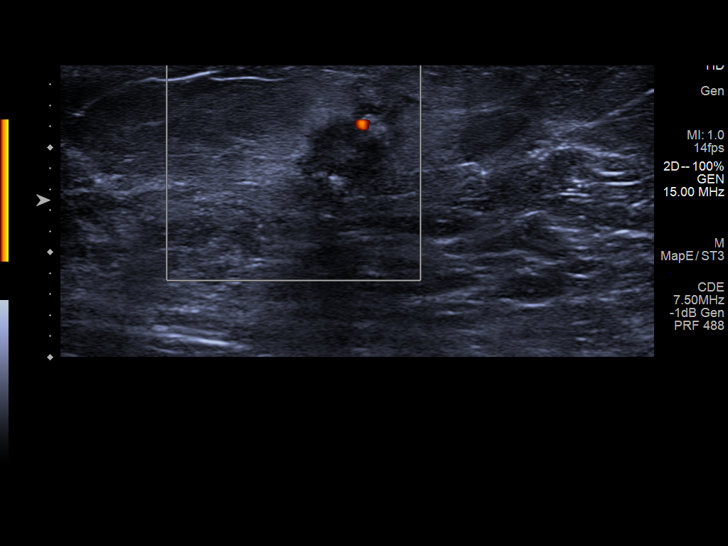
[im 7/13]
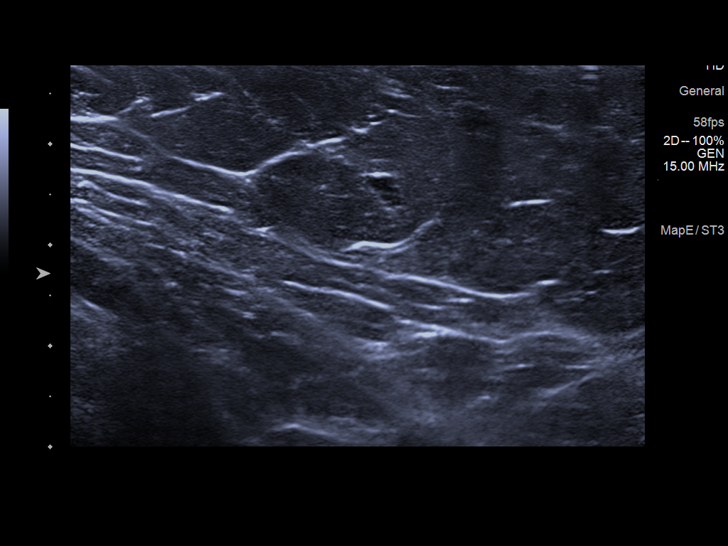
[im 8/13]
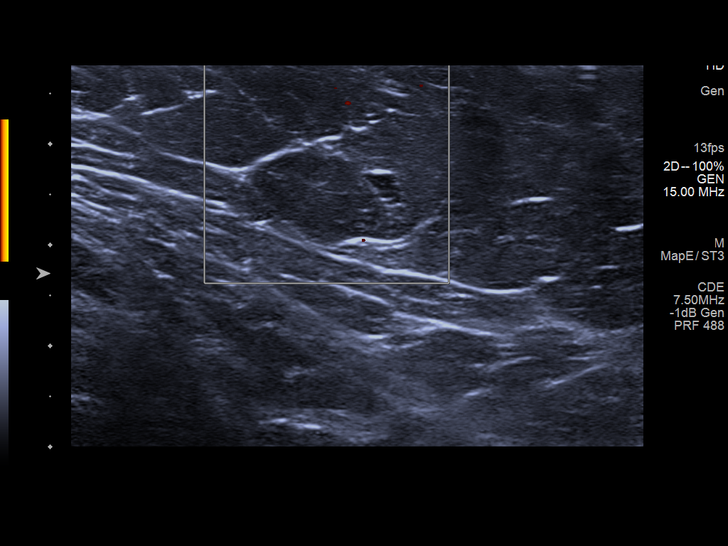
[im 9/13]
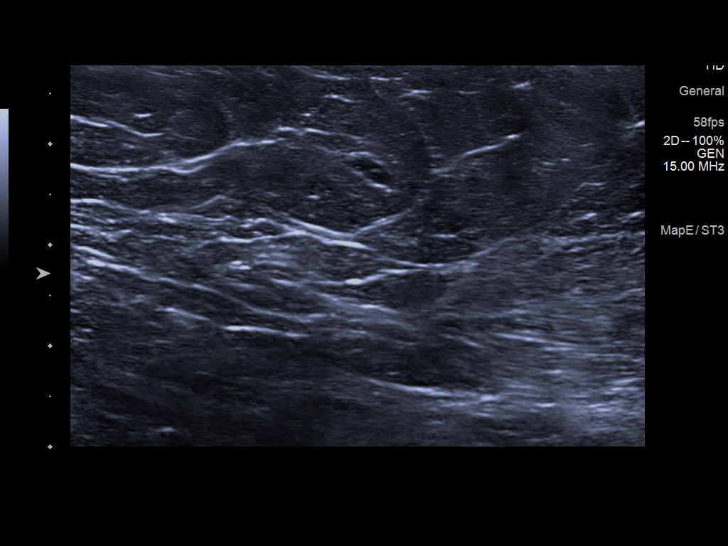
[im 10/13]
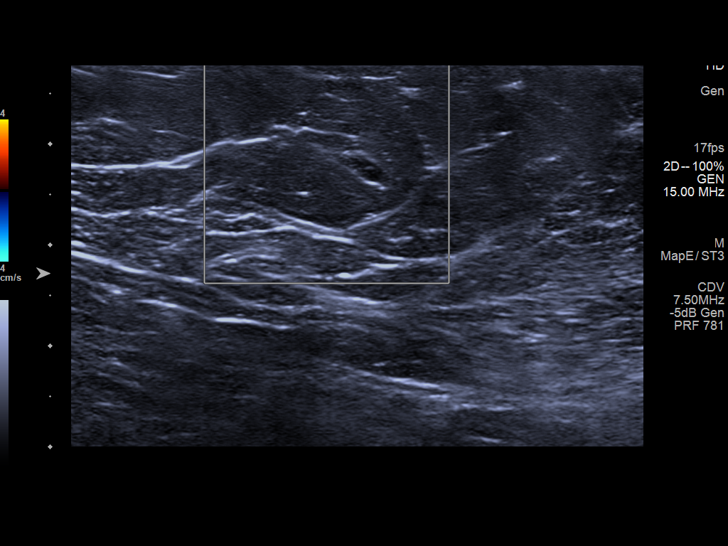
[im 11/13]
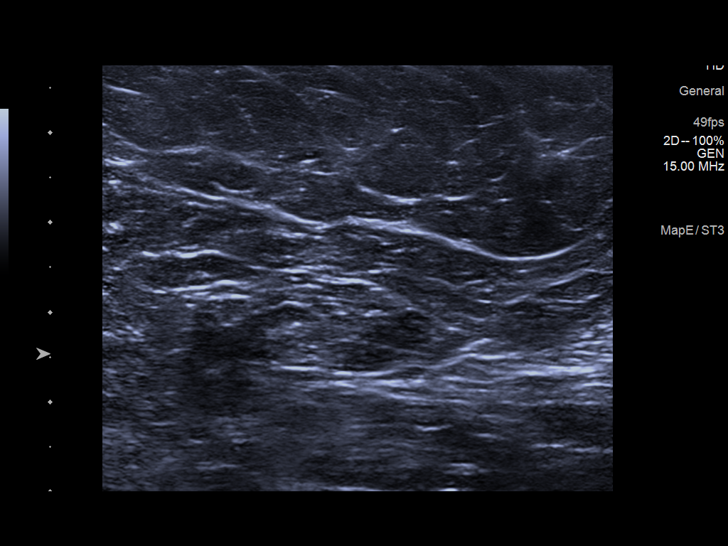
[im 12/13]
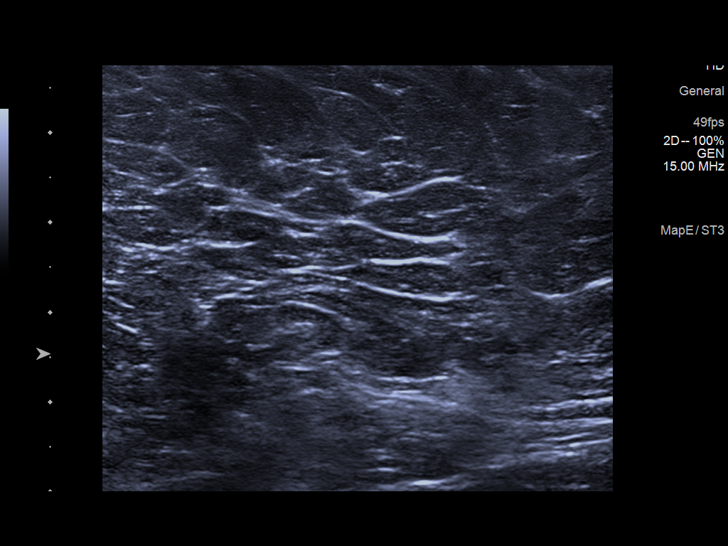
[im 13/13]
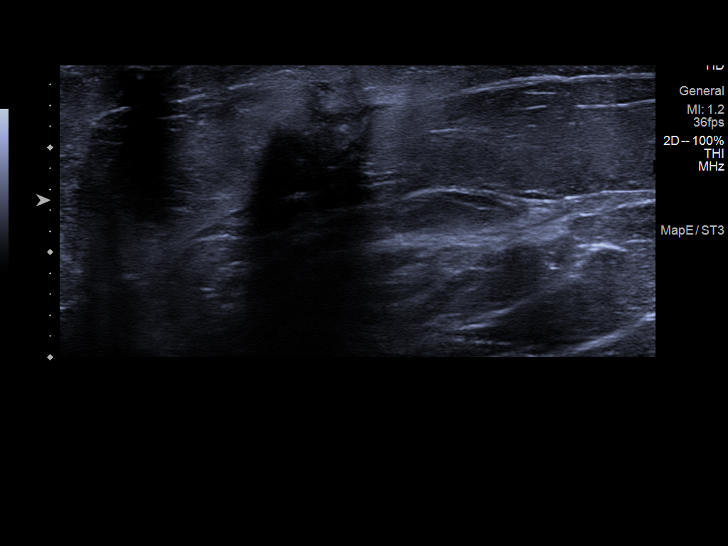

[13 of 13 positions shown; findings below may reference images not displayed]

ACR Breast Density Category b: There are scattered areas of
fibroglandular density.
FINDINGS: There is a mass with associated coarse calcifications corresponding
to the site of palpable concern. This measures approximately 1.1 cm
and contains some fat density seen on the tomograms. This mass is
located at the site of the previously seen benign oil cyst. A new
0.9 cm round benign calcification is seen within the upper right
breast.

Mammographic images were processed with CAD.

Physical examination reveals a firm mobile marble size mass in the
upper-outer quadrant of the right breast.

Targeted ultrasound of the right breast was performed. There is an
irregular mixed echogenicity mass in the right breast at the 10
o'clock position 8 cm from nipple superficial depth measuring 1.2 x
1.7 x 1.3 cm. This corresponds well with the mass seen in the right
breast at mammography. Given this mass is located at the site of
previously seen oil cyst and per the patient has substantially
decreased in size over the past month it is felt to represent an
area of fat necrosis.
IMPRESSION: Probably benign right breast mass.

RECOMMENDATION:
Recommend the patient return in 8 weeks for right breast ultrasound.
If this mass has not decreased in size or demonstrate clearly benign
features when the patient returns for her ultrasound, then
ultrasound-guided core biopsy would be warranted.

I have discussed the findings and recommendations with the patient.
If applicable, a reminder letter will be sent to the patient
regarding the next appointment.

BI-RADS CATEGORY  3: Probably benign.

## 2019-11-25 IMAGING — MG MM DIGITAL DIAGNOSTIC UNILAT*R* W/ TOMO W/ CAD
8 series · 8 of 24 positions shown · non-contrast
Comparison: Previous exam(s).

CLINICAL DATA: 43-year-old female with a palpable area of concern
in the right breast. Patient states she has had a palpable
abnormality in the upper-outer right breast for many years, however
in [REDACTED] she felt like this area was much larger but has
substantially decreased in size over the past month.

EXAM:
DIGITAL DIAGNOSTIC UNILATERAL RIGHT MAMMOGRAM WITH CAD AND TOMO
RIGHT BREAST ULTRASOUND

[R CC synth-2D (1 of 2)]
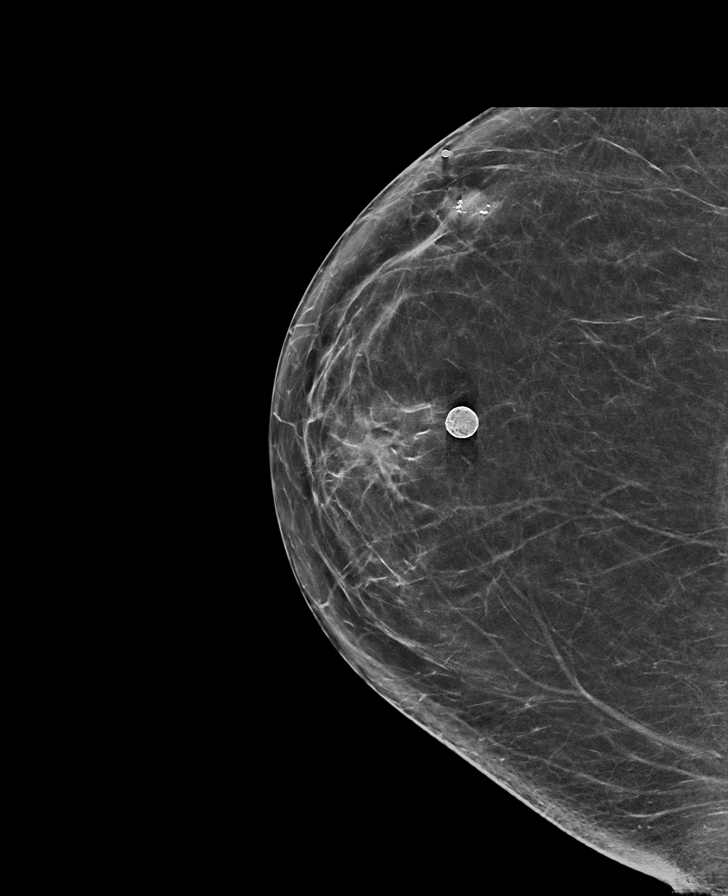

[R MLO synth-2D]
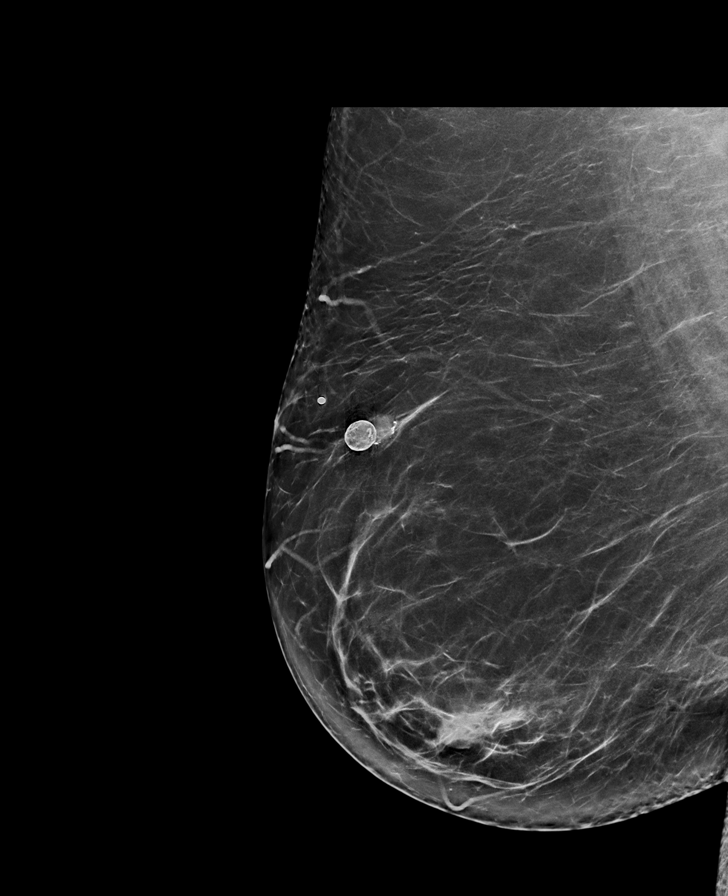

[R TAN synth-2D]
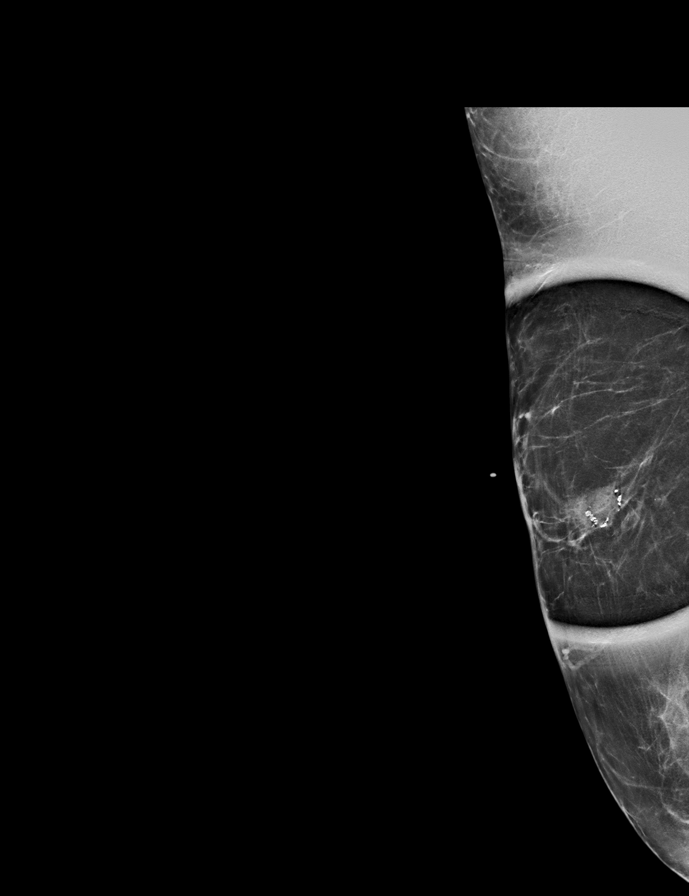

[R CC synth-2D (2 of 2)]
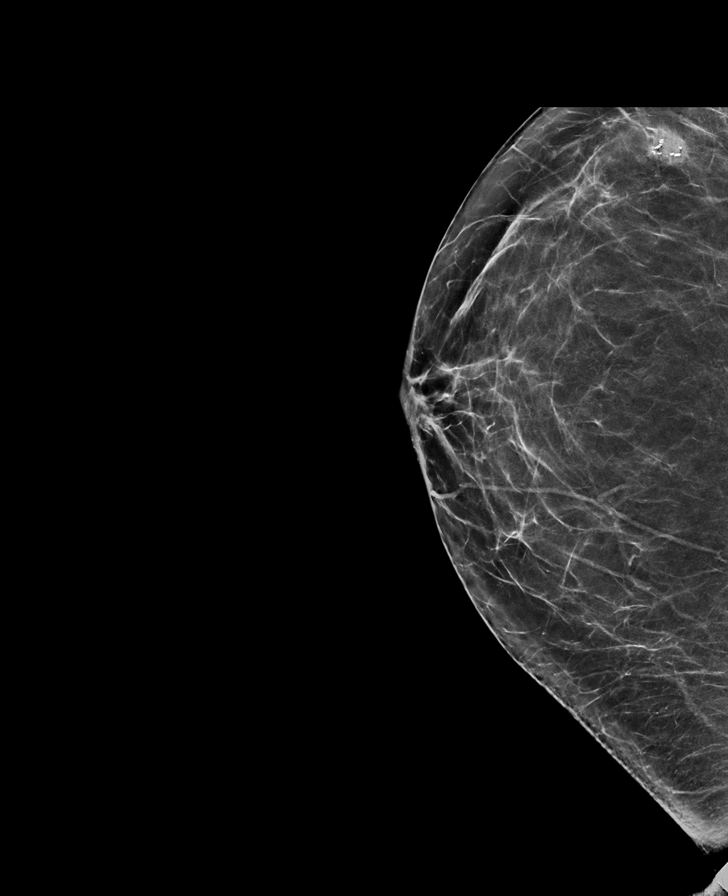

[R CC tomo (1 of 2) · tomo slice 39/76.0]
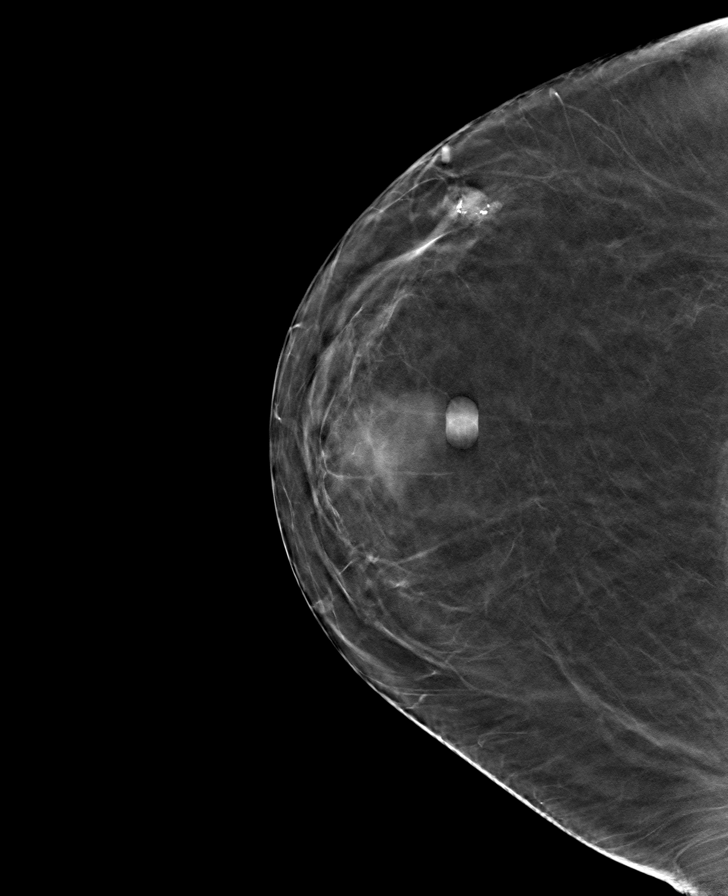

[R CC tomo (2 of 2) · tomo slice 35/69.0]
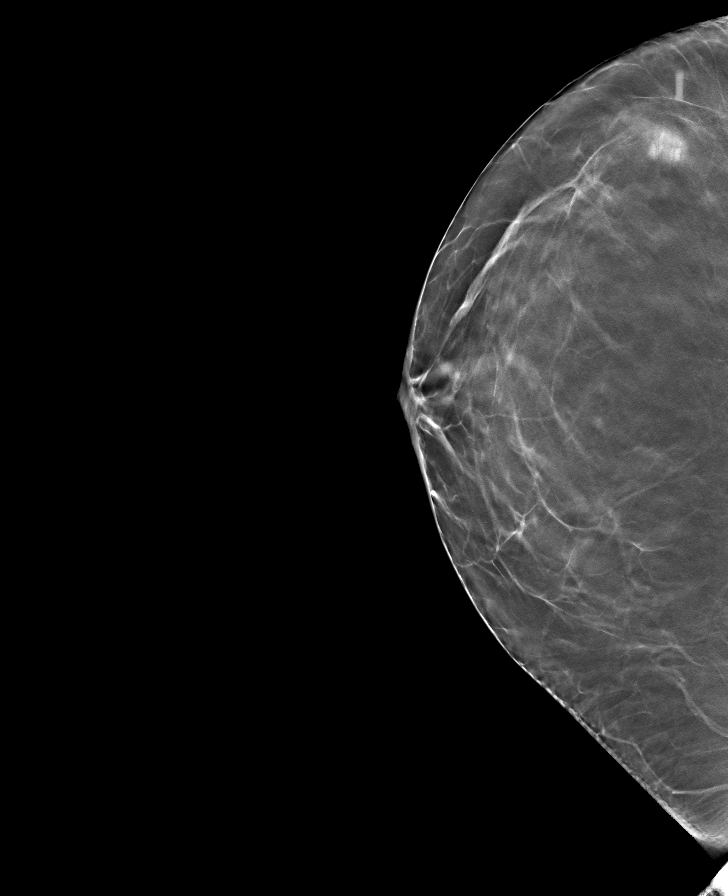

[R MLO tomo · tomo slice 45/88.0]
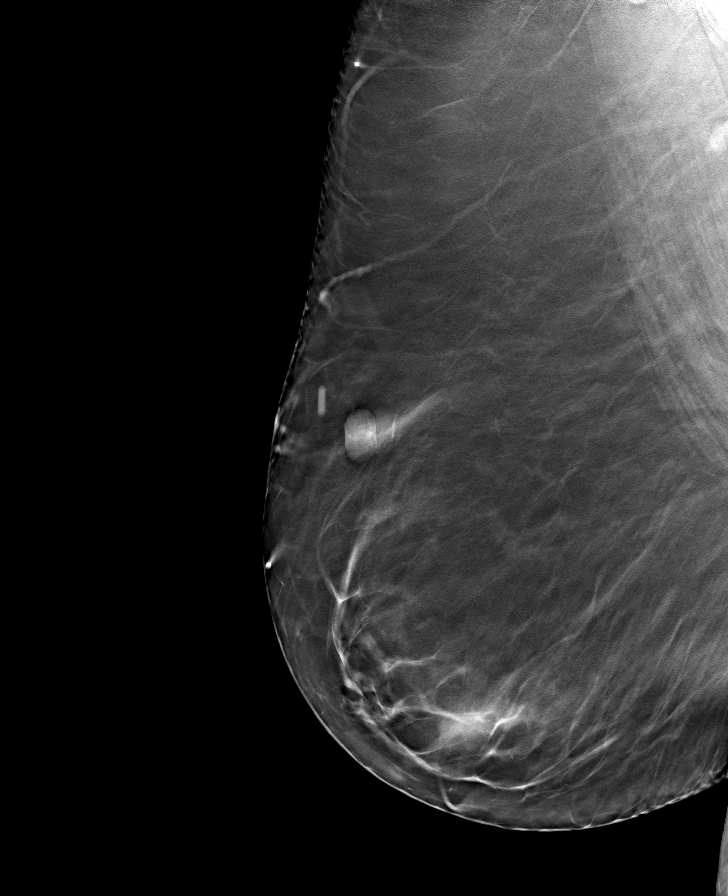

[R TAN tomo · tomo slice 27/53.0]
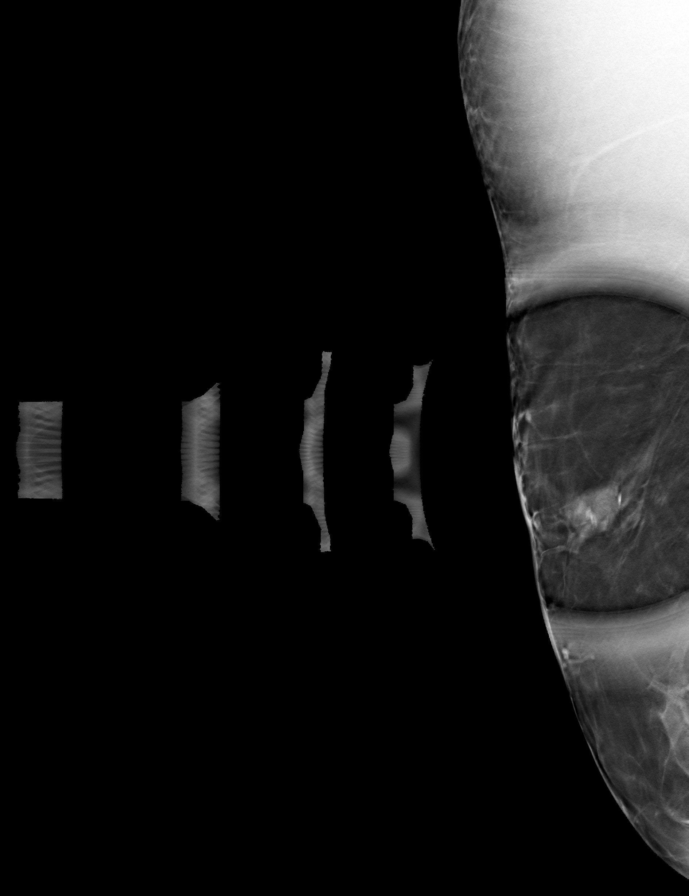

[8 of 24 positions shown; findings below may reference images not displayed]

ACR Breast Density Category b: There are scattered areas of
fibroglandular density.
FINDINGS: There is a mass with associated coarse calcifications corresponding
to the site of palpable concern. This measures approximately 1.1 cm
and contains some fat density seen on the tomograms. This mass is
located at the site of the previously seen benign oil cyst. A new
0.9 cm round benign calcification is seen within the upper right
breast.

Mammographic images were processed with CAD.

Physical examination reveals a firm mobile marble size mass in the
upper-outer quadrant of the right breast.

Targeted ultrasound of the right breast was performed. There is an
irregular mixed echogenicity mass in the right breast at the 10
o'clock position 8 cm from nipple superficial depth measuring 1.2 x
1.7 x 1.3 cm. This corresponds well with the mass seen in the right
breast at mammography. Given this mass is located at the site of
previously seen oil cyst and per the patient has substantially
decreased in size over the past month it is felt to represent an
area of fat necrosis.
IMPRESSION: Probably benign right breast mass.

RECOMMENDATION:
Recommend the patient return in 8 weeks for right breast ultrasound.
If this mass has not decreased in size or demonstrate clearly benign
features when the patient returns for her ultrasound, then
ultrasound-guided core biopsy would be warranted.

I have discussed the findings and recommendations with the patient.
If applicable, a reminder letter will be sent to the patient
regarding the next appointment.

BI-RADS CATEGORY  3: Probably benign.

## 2019-11-25 NOTE — Telephone Encounter (Signed)
Left message that Right breast US and mammogram shows a mass (?fat necrosis), for 8 weeks follow up US

## 2019-12-30 ENCOUNTER — Other Ambulatory Visit (HOSPITAL_COMMUNITY): Payer: Self-pay | Admitting: Adult Health

## 2019-12-30 DIAGNOSIS — N63 Unspecified lump in unspecified breast: Secondary | ICD-10-CM

## 2020-01-20 ENCOUNTER — Ambulatory Visit (HOSPITAL_COMMUNITY): Payer: 59

## 2020-02-06 ENCOUNTER — Encounter (HOSPITAL_COMMUNITY): Payer: Self-pay | Admitting: *Deleted

## 2020-02-06 ENCOUNTER — Emergency Department (HOSPITAL_COMMUNITY): Payer: 59

## 2020-02-06 ENCOUNTER — Emergency Department (HOSPITAL_COMMUNITY)
Admission: EM | Admit: 2020-02-06 | Discharge: 2020-02-07 | Disposition: A | Discharge status: Home / Self Care | Payer: 59 | Attending: Emergency Medicine | Admitting: Emergency Medicine

## 2020-02-06 ENCOUNTER — Other Ambulatory Visit: Payer: Self-pay

## 2020-02-06 DIAGNOSIS — R0602 Shortness of breath: Secondary | ICD-10-CM | POA: Diagnosis not present

## 2020-02-06 DIAGNOSIS — R079 Chest pain, unspecified: Secondary | ICD-10-CM | POA: Diagnosis not present

## 2020-02-06 DIAGNOSIS — Z5321 Procedure and treatment not carried out due to patient leaving prior to being seen by health care provider: Secondary | ICD-10-CM | POA: Diagnosis not present

## 2020-02-06 LAB — BASIC METABOLIC PANEL
Anion gap: 12 (ref 5–15)
BUN: 17 mg/dL (ref 6–20)
CO2: 24 mmol/L (ref 22–32)
Calcium: 8.7 mg/dL — ABNORMAL LOW (ref 8.9–10.3)
Chloride: 102 mmol/L (ref 98–111)
Creatinine, Ser: 1.06 mg/dL — ABNORMAL HIGH (ref 0.44–1.00)
GFR calc Af Amer: >60 mL/min (ref >60)
GFR calc non Af Amer: >60 mL/min (ref >60)
Glucose, Bld: 121 mg/dL — ABNORMAL HIGH (ref 70–99)
Potassium: 3.2 mmol/L — ABNORMAL LOW (ref 3.5–5.1)
Sodium: 138 mmol/L (ref 135–145)

## 2020-02-06 LAB — CBC
HCT: 31.2 % — ABNORMAL LOW (ref 36.0–46.0)
Hemoglobin: 9.9 g/dL — ABNORMAL LOW (ref 12.0–15.0)
MCH: 29.6 pg (ref 26.0–34.0)
MCHC: 31.7 g/dL (ref 30.0–36.0)
MCV: 93.1 fL (ref 80.0–100.0)
Platelets: 435 10*3/uL — ABNORMAL HIGH (ref 150–400)
RBC: 3.35 MIL/uL — ABNORMAL LOW (ref 3.87–5.11)
RDW: 12.5 % (ref 11.5–15.5)
WBC: 9.7 10*3/uL (ref 4.0–10.5)
nRBC: 0 % (ref 0.0–0.2)

## 2020-02-06 LAB — TROPONIN I (HIGH SENSITIVITY)
Troponin I (High Sensitivity): 8 ng/L (ref <18)
Troponin I (High Sensitivity): 9 ng/L (ref <18)

## 2020-02-06 IMAGING — DX DG CHEST 1V PORT
1 series · 1 of 1 positions shown · non-contrast
Comparison: [DATE]

CLINICAL DATA: Shortness of breath and chest pain

EXAM:
PORTABLE CHEST 1 VIEW

[chest ap]
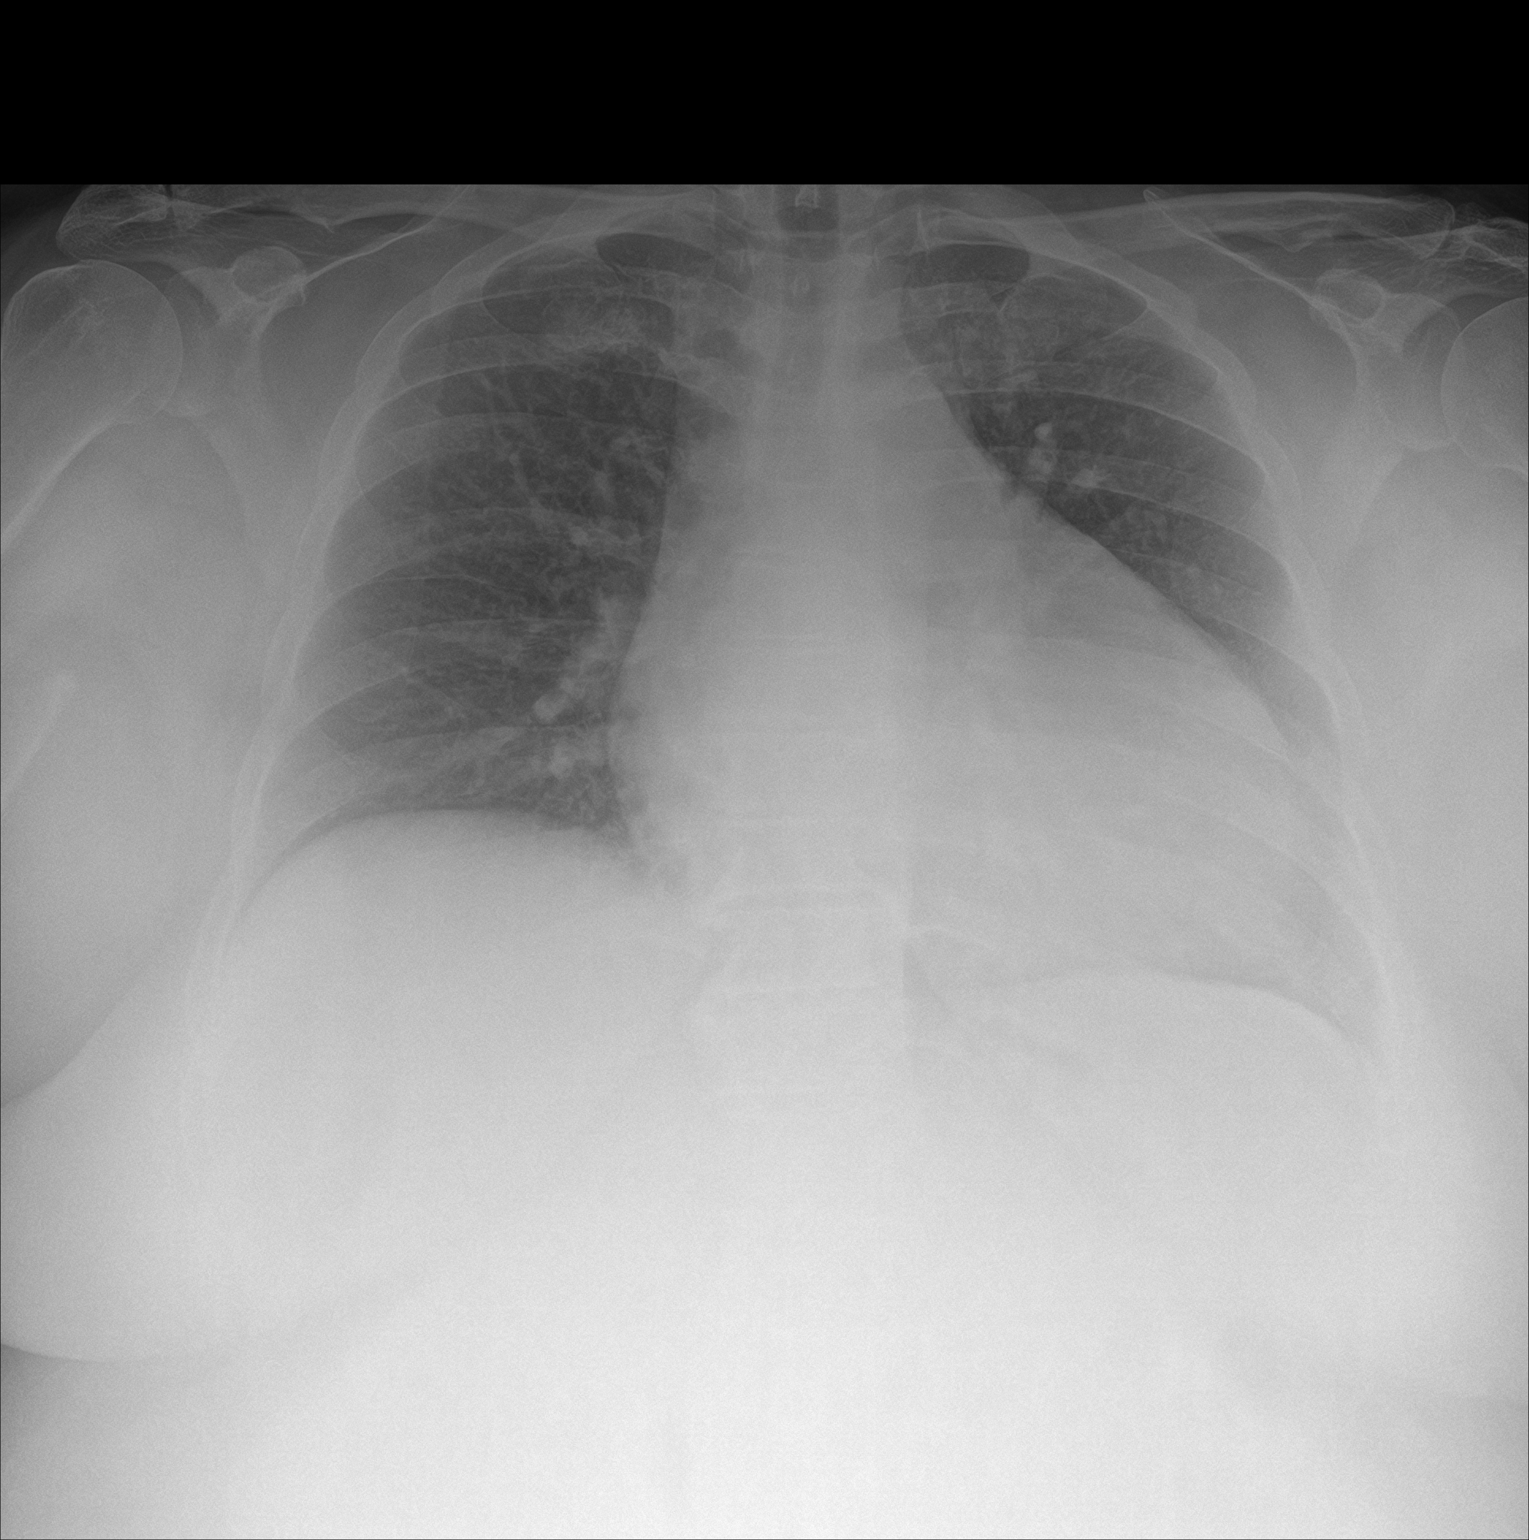

[1 of 1 positions shown; findings below may reference images not displayed]

FINDINGS: Cardiomegaly. No focal opacity or pleural effusion. No pneumothorax.
IMPRESSION: Interval mild cardiomegaly. Negative for edema or focal pulmonary
airspace disease.

## 2020-02-06 MED ORDER — SODIUM CHLORIDE 0.9% FLUSH
3.0000 mL | Freq: Once | INTRAVENOUS | Status: AC
  Administered 2020-02-06: 3 mL via INTRAVENOUS

## 2020-02-06 NOTE — ED Triage Notes (Signed)
Pt with sob today and left sided chest pain for 45 minutes.   Sob ongoing all week off and on . Pt states her BP medication has been adjusted.  Pt with left neck pain that radiated into  Shoulder blades last night.

## 2020-02-06 NOTE — ED Notes (Signed)
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

## 2020-02-06 NOTE — ED Notes (Signed)
Pt states pain is getting worse, now in her left neck and radiates down left arm

## 2020-02-07 NOTE — ED Notes (Signed)
Pt wanting to leave AMA. Explained to pt the risks of leaving.

## 2020-02-10 ENCOUNTER — Ambulatory Visit (HOSPITAL_COMMUNITY): Admission: RE | Admit: 2020-02-10 | Payer: 59 | Source: Ambulatory Visit

## 2020-02-12 DIAGNOSIS — I10 Essential (primary) hypertension: Secondary | ICD-10-CM | POA: Diagnosis not present

## 2020-02-17 ENCOUNTER — Other Ambulatory Visit: Payer: Self-pay

## 2020-02-17 ENCOUNTER — Ambulatory Visit (HOSPITAL_COMMUNITY)
Admission: RE | Admit: 2020-02-17 | Discharge: 2020-02-17 | Disposition: A | Discharge status: Home / Self Care | Payer: 59 | Source: Ambulatory Visit | Attending: Adult Health | Admitting: Adult Health

## 2020-02-17 DIAGNOSIS — N63 Unspecified lump in unspecified breast: Secondary | ICD-10-CM

## 2020-02-17 DIAGNOSIS — N6311 Unspecified lump in the right breast, upper outer quadrant: Secondary | ICD-10-CM | POA: Diagnosis not present

## 2020-02-17 IMAGING — US US BREAST*R* LIMITED INC AXILLA
1 series · 8 of 8 positions shown · non-contrast
Comparison: Previous exam(s).

CLINICAL DATA: 43-year-old patient presents for follow-up probably
necrosis 10 o'clock axis of right breast 8 from nipple. Patient
states that the lump feels slightly smaller to her now than it did
in [DATE]. She has no new areas of concern.

EXAM:
ULTRASOUND OF THE RIGHT BREAST

[Series 1: us breast*right* limited inc axilla · 0.07mm/px · 8 of 8 slices shown]
[im 1/8]
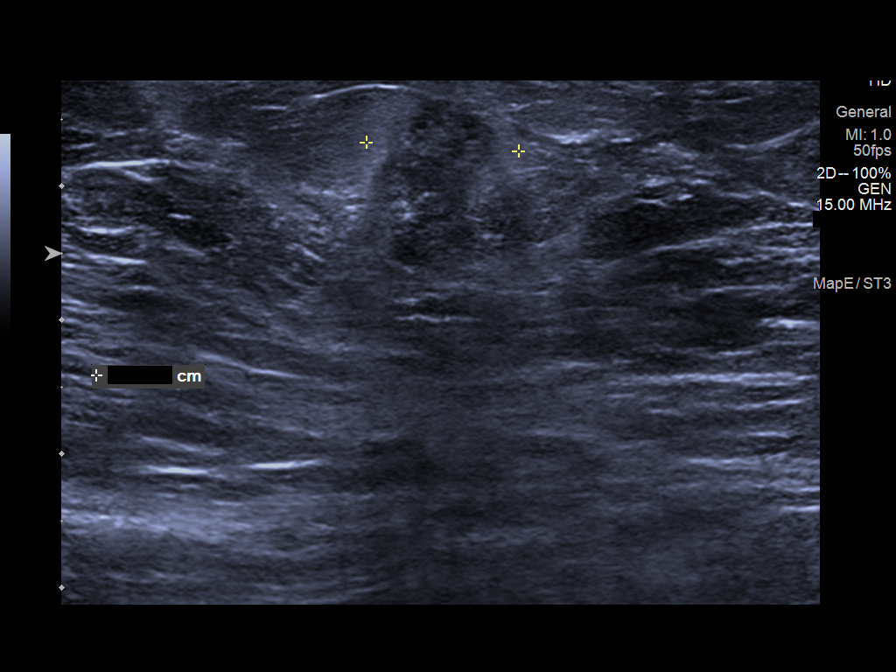
[im 2/8]
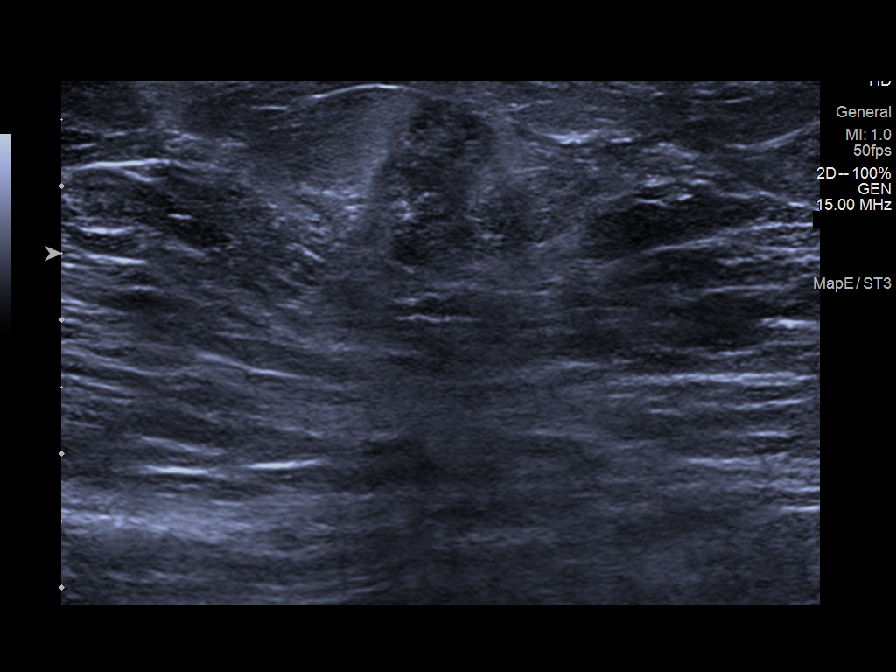
[im 3/8]
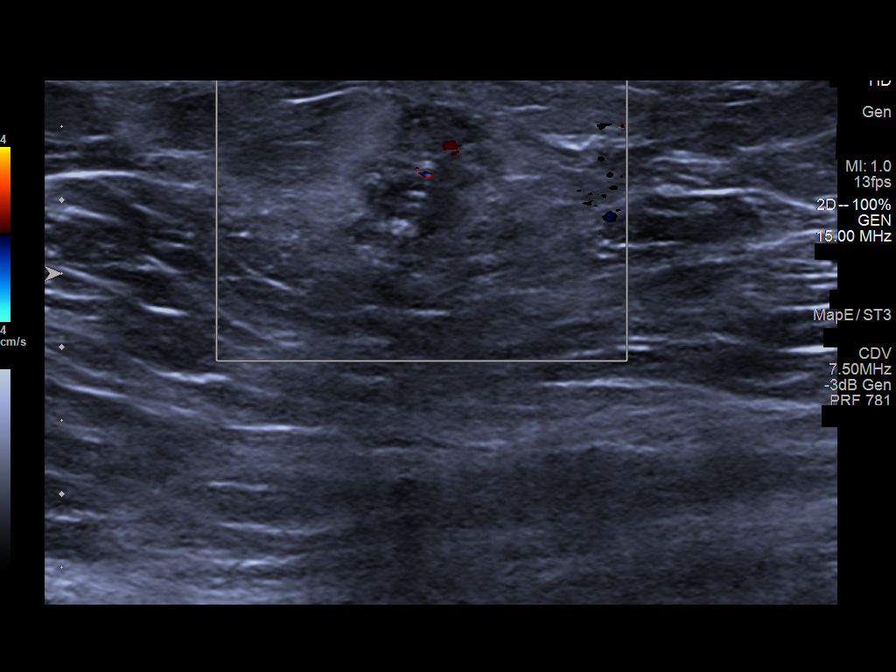
[im 4/8]
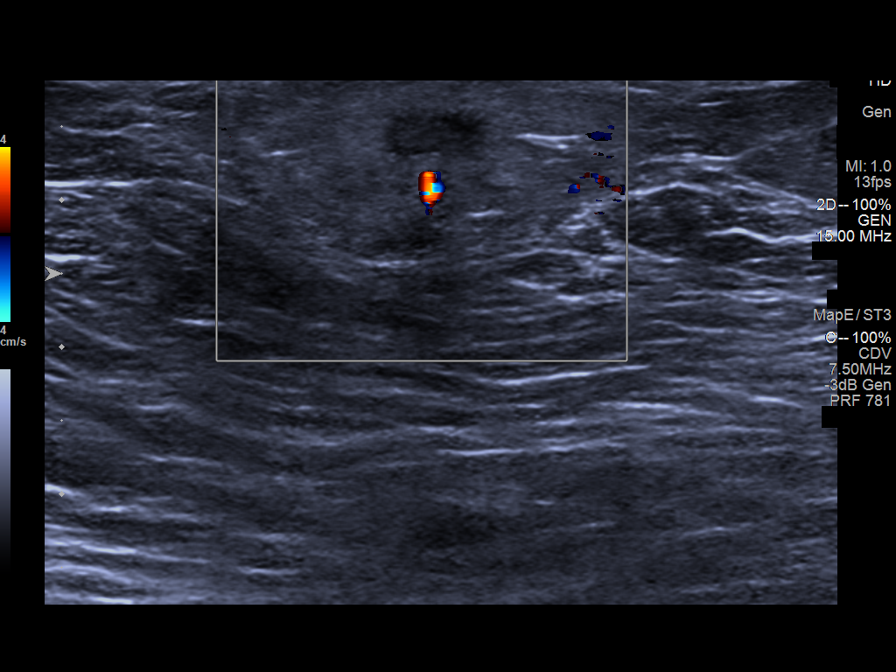
[im 5/8]
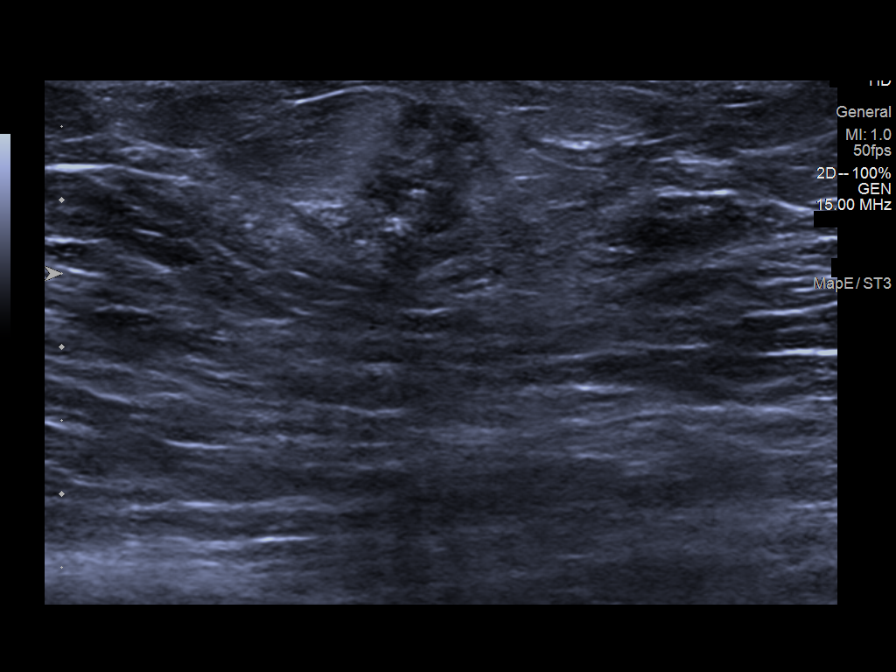
[im 6/8]
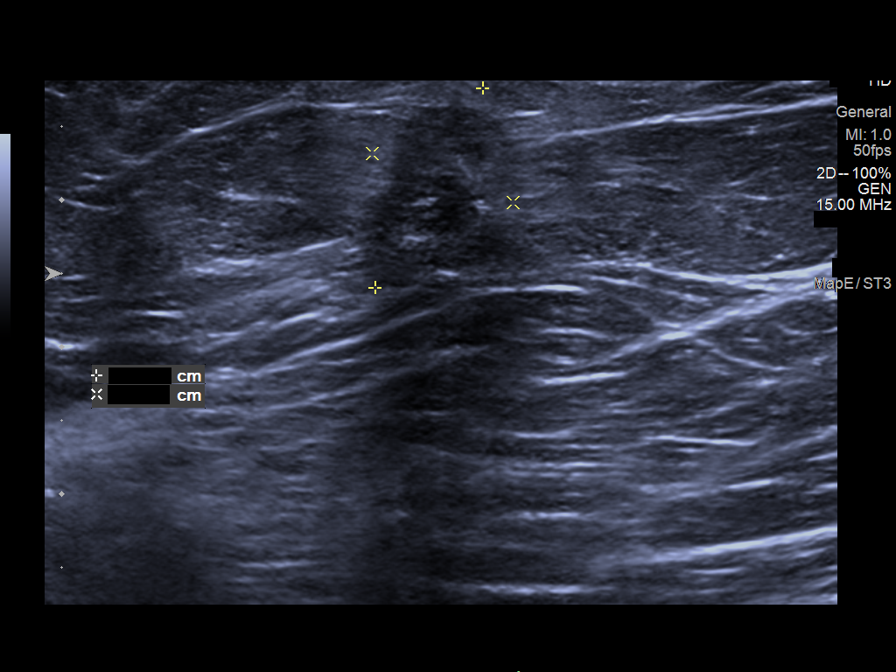
[im 7/8]
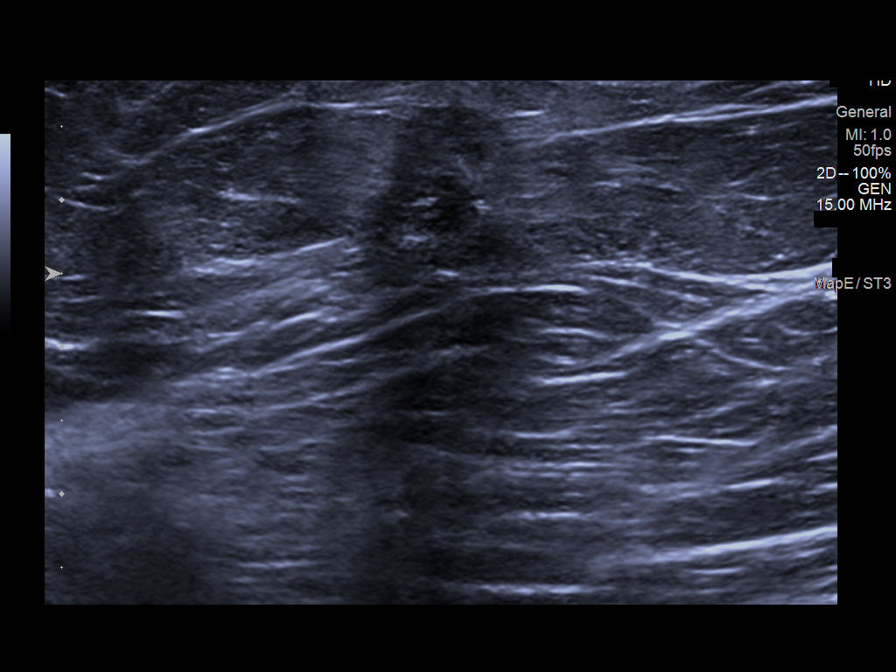
[im 8/8]
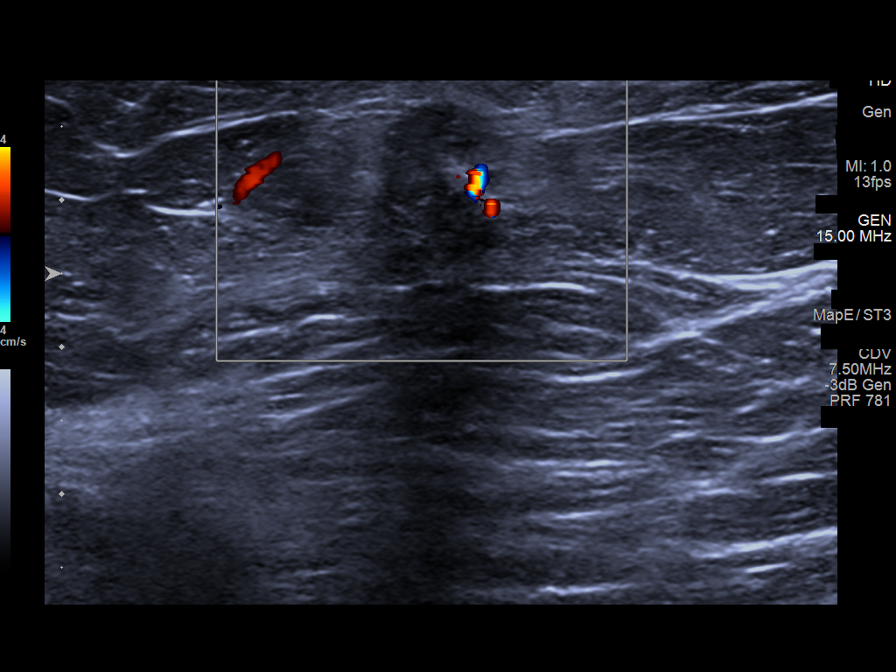

[8 of 8 positions shown; findings below may reference images not displayed]

FINDINGS: On physical exam, there is a superficial firm approximately 1.5 cm
lump at 10 o'clock position 8 cm from nipple. Overlying skin appears
normal.

Targeted ultrasound is performed, showing a superficial mixed
echogenicity mass measuring approximately 1.5 x 1.0 x 1.1 cm.
Previously, this measured 1.7 x 1.2 x 1.3 cm in [DATE].
IMPRESSION: Stable to slightly smaller probably benign right breast mass 10
o'clock position, favored to be benign fat necrosis.

RECOMMENDATION:
Bilateral diagnostic mammogram and possible right breast ultrasound
is recommended in [DATE].

I have discussed the findings and recommendations with the patient.
If applicable, a reminder letter will be sent to the patient
regarding the next appointment.

BI-RADS CATEGORY  3: Probably benign.

## 2020-02-25 DIAGNOSIS — I10 Essential (primary) hypertension: Secondary | ICD-10-CM | POA: Diagnosis not present

## 2020-03-01 ENCOUNTER — Other Ambulatory Visit: Payer: Self-pay

## 2020-03-01 ENCOUNTER — Ambulatory Visit (INDEPENDENT_AMBULATORY_CARE_PROVIDER_SITE_OTHER): Payer: 59 | Admitting: Cardiology

## 2020-03-01 ENCOUNTER — Encounter: Payer: Self-pay | Admitting: Cardiology

## 2020-03-01 VITALS — BP 128/78 | HR 113 | Ht 63.0 in | Wt 293.6 lb

## 2020-03-01 DIAGNOSIS — I517 Cardiomegaly: Secondary | ICD-10-CM

## 2020-03-01 DIAGNOSIS — R0602 Shortness of breath: Secondary | ICD-10-CM

## 2020-03-01 DIAGNOSIS — R011 Cardiac murmur, unspecified: Secondary | ICD-10-CM

## 2020-03-01 DIAGNOSIS — I10 Essential (primary) hypertension: Secondary | ICD-10-CM | POA: Diagnosis not present

## 2020-03-01 NOTE — Progress Notes (Signed)
Cardiology Office Note  Date: 03/01/2020   ID: Lauren Osborn, DOB 1976-08-16, MRN [id number]  PCP:  Dara Lords, NP  Cardiologist:  Rozann Lesches, MD Electrophysiologist:  None   Chief Complaint  Patient presents with  . Leg Swelling    History of Present Illness: Lauren Osborn is a 44 y.o. female referred for cardiology consultation by Ms. Dekoninck NP at the [facility] in [place] for evaluation of reported cardiomegaly.  Records indicate that mild interval cardiomegaly was described based on single view chest x-ray done on June 4 at [facility].  She was seen at that time in the ER reporting shortness of breath, chest pain and leg swelling, was not completely evaluated as she left AMA.  We discussed her symptoms today.  She tells me that she has been managed for hypertension by her PCP, initially started on lisinopril however this was discontinued due to cough.  She was then started on telmisartan and reported experiencing neck pain, chest pain, and shortness of breath which was the main reason that she was seen in the ER as noted above.  She since stopped the medication and is feeling better.  She has also been feeling short of breath intermittently, and has had leg swelling with evidence of edema and lymphedema, improved on Lasix.  She has no history of cardiac arrhythmia or cardiomyopathy although her mother has heart disease, congestive heart failure and arrhythmia.  She works as a Presenter, broadcasting.  Past Medical History:  Diagnosis Date  . Essential hypertension   . Fibroids   . Gout   . Thyroid nodule     Past Surgical History:  Procedure Laterality Date  . HYSTERECTOMY ABDOMINAL WITH SALPINGECTOMY    . THYROIDECTOMY Right 10/21/2018   Procedure: RIGHT HEMITHYROIDECTOMY;  Surgeon: Leta Baptist, MD;  Location: [place];  Service: ENT;  Laterality: Right;    Current Outpatient Medications  Medication Sig Dispense Refill  . ALBUTEROL  SULFATE IN Inhale 2 puffs into the lungs as needed (wheezing).     . FEROSUL 325 (65 Fe) MG tablet Take 325 mg by mouth 2 (two) times daily.    . furosemide (LASIX) 20 MG tablet Take 20 mg by mouth 2 (two) times daily.    . pantoprazole (PROTONIX) 40 MG tablet Take 40 mg by mouth daily.    . potassium chloride (KLOR-CON) 10 MEQ tablet Take 10 mEq by mouth 2 (two) times daily.     No current facility-administered medications for this visit.   Allergies:  Percocet [oxycodone-acetaminophen]   Social History: The patient  reports that she has quit smoking. Her smoking use included cigarettes. She has never used smokeless tobacco. She reports previous alcohol use. She reports that she does not use drugs.   Family History: The patient's family history includes Breast cancer in her mother; Colon cancer in her father; Congestive Heart Failure in her maternal grandfather; Diabetes in her daughter; Diverticulitis in her brother and sister; Other in her maternal grandmother and paternal grandfather.   ROS:   No palpitations or syncope.  Physical Exam: VS:  BP 128/78   Pulse (!) 113   Ht 5\' 3"  (1.6 m)   Wt 293 lb 9.6 oz (133.2 kg)   SpO2 98%   BMI 52.01 kg/m , BMI Body mass index is 52.01 kg/m.  Wt Readings from Last 3 Encounters:  03/01/20 293 lb 9.6 oz (133.2 kg)  11/04/19 292 lb (132.5 kg)  08/04/19  292 lb 14.4 oz (132.9 kg)    General: Patient appears comfortable at rest. HEENT: Conjunctiva and lids normal, wearing a mask. Neck: Supple, no elevated JVP or carotid bruits, no thyromegaly. Lungs: Clear to auscultation, nonlabored breathing at rest. Cardiac: Regular rate and rhythm, no S3, 3/6 to and fro systolic and diastolic heart murmur best heard at the base, not continuous, no obvious gallop. Abdomen: Soft, nontender, bowel sounds present. Extremities: Lymphedema present bilaterally, distal pulses 2+. Skin: Warm and dry. Musculoskeletal: No kyphosis. Neuropsychiatric: Alert and  oriented x3, affect grossly appropriate.  ECG:  An ECG dated 02/06/2020 was personally reviewed today and demonstrated:  Sinus rhythm with nondiagnostic inferior Q waves, nonspecific T wave changes.  Recent Labwork: 08/04/2019: B Natriuretic Peptide 37.0 02/06/2020: BUN 17; Creatinine, Ser 1.06; Hemoglobin 9.9; Platelets 435; Potassium 3.2; Sodium 138  02/25/2020: BUN 7, creatinine 0.7, potassium 3.6  Other Studies Reviewed Today:  Chest x-ray 02/06/2020: FINDINGS: Cardiomegaly. No focal opacity or pleural effusion. No pneumothorax.  IMPRESSION: Interval mild cardiomegaly. Negative for edema or focal pulmonary airspace disease.  Assessment and Plan:  1.  Intermittent dyspnea on exertion with leg swelling/lymphedema, fairly prominent cardiac murmur as described above, and cardiomegaly based on single view of the chest from early June but no pulmonary edema.  She does not report any history of congenital heart disease or cardiac arrhythmias.  Recent ECG showed sinus rhythm with nondiagnostic inferior Q waves and nonspecific T wave changes.  Plan is to obtain an echocardiogram to investigate cardiac structure and function and determine next step.  For now continue Lasix with potassium supplement.  2.  Essential hypertension by history.  She did not tolerate lisinopril or telmisartan as described above.  Blood pressure today is 128/78, would not add any new medications at this time pending further evaluation with echocardiogram.  Medication Adjustments/Labs and Tests Ordered: Current medicines are reviewed at length with the patient today.  Concerns regarding medicines are outlined above.   Tests Ordered: Orders Placed This Encounter  Procedures  . ECHOCARDIOGRAM COMPLETE    Medication Changes: No orders of the defined types were placed in this encounter.   Disposition:  Follow up test results and determine next step.  Signed, Satira Sark, MD, [facility] 03/01/2020 3:31 PM    [facility] [address], [place], [place] [address] Phone: [phone]; Fax: [phone]

## 2020-03-01 NOTE — Patient Instructions (Signed)
Medication Instructions:  Your physician recommends that you continue on your current medications as directed. Please refer to the Current Medication list given to you today.  *If you need a refill on your cardiac medications before your next appointment, please call your pharmacy*   Lab Work: None today If you have labs (blood work) drawn today and your tests are completely normal, you will receive your results only by: Marland Kitchen MyChart Message (if you have MyChart) OR . A paper copy in the mail If you have any lab test that is abnormal or we need to change your treatment, we will call you to review the results.   Testing/Procedures: .insttecho    Follow-Up: At [facility], you and your health needs are our priority.  As part of our continuing mission to provide you with exceptional heart care, we have created designated Provider Care Teams.  These Care Teams include your primary Cardiologist (physician) and Advanced Practice Providers (APPs -  Physician Assistants and Nurse Practitioners) who all work together to provide you with the care you need, when you need it.  We recommend signing up for the patient portal called "MyChart".  Sign up information is provided on this After Visit Summary.  MyChart is used to connect with patients for Virtual Visits (Telemedicine).  Patients are able to view lab/test results, encounter notes, upcoming appointments, etc.  Non-urgent messages can be sent to your provider as well.   To learn more about what you can do with MyChart, go to [email or web address].    Your next appointment:  We will call you with results.     Thank you for choosing [facility] !

## 2020-03-02 ENCOUNTER — Encounter: Payer: Self-pay | Admitting: Internal Medicine

## 2020-03-18 ENCOUNTER — Ambulatory Visit (HOSPITAL_COMMUNITY)
Admission: RE | Admit: 2020-03-18 | Discharge: 2020-03-18 | Disposition: A | Discharge status: Home / Self Care | Payer: 59 | Source: Ambulatory Visit | Attending: Cardiology | Admitting: Cardiology

## 2020-03-18 ENCOUNTER — Other Ambulatory Visit: Payer: Self-pay

## 2020-03-18 DIAGNOSIS — R011 Cardiac murmur, unspecified: Secondary | ICD-10-CM | POA: Diagnosis not present

## 2020-03-18 DIAGNOSIS — R0602 Shortness of breath: Secondary | ICD-10-CM

## 2020-03-18 LAB — ECHOCARDIOGRAM COMPLETE
Area-P 1/2: 4.01 cm2
S' Lateral: 3.03 cm

## 2020-03-18 NOTE — Progress Notes (Signed)
*  PRELIMINARY RESULTS* Echocardiogram 2D Echocardiogram has been performed.  Lauren Osborn 03/18/2020, 3:19 PM

## 2020-04-23 ENCOUNTER — Ambulatory Visit (INDEPENDENT_AMBULATORY_CARE_PROVIDER_SITE_OTHER): Payer: 59 | Admitting: Gastroenterology

## 2020-04-23 ENCOUNTER — Other Ambulatory Visit: Payer: Self-pay

## 2020-04-23 ENCOUNTER — Encounter: Payer: Self-pay | Admitting: Gastroenterology

## 2020-04-23 ENCOUNTER — Telehealth: Payer: Self-pay

## 2020-04-23 VITALS — BP 124/90 | HR 105 | Temp 97.5°F | Ht 63.0 in | Wt 292.4 lb

## 2020-04-23 DIAGNOSIS — D649 Anemia, unspecified: Secondary | ICD-10-CM | POA: Diagnosis not present

## 2020-04-23 DIAGNOSIS — Z800 Family history of malignant neoplasm of digestive organs: Secondary | ICD-10-CM | POA: Diagnosis not present

## 2020-04-23 NOTE — Progress Notes (Signed)
Primary Care Physician:  Dara Lords, NP  Referring Physician: Leda Gauze, NP Primary Gastroenterologist:  Dr. Abbey Chatters  Chief Complaint  Patient presents with  . Consult    TCS  . Anemia    stools not dark and no blood in stool    HPI:   JOLYNNE SPURGIN is a 44 y.o. female presenting today at the request of Leda Gauze, NP, due to new onset anemia. Hgb 9.9, Hct 30.3, iron low at 21, ferritin elevated at 304. sats low at 8%. Prior Hgb Nov 2020 normal at 14.8. New onset normocytic anemia in June 2021.   States she started noting edema in lower legs, starting on medication, routine blood work showed she was anemic. No melena or hematochezia. No changes in bowel habits, usually on looser side of stool but not diarrhea. Taking iron pills and feels nauseated. Tries to take it with food. Takes twice a day. No dysphagia. No menses. Hysterectomy in 2008. Tylenol products prn. No NSAIDs. Chronic GERD on Protonix, which controls symptoms. Has seen cardiology due to [place]. Echo completed. Stable.   Father: colon cancer age 50. Deceased. Mets to liver.   Past Medical History:  Diagnosis Date  . Essential hypertension   . Fibroids   . Gout   . Thyroid nodule     Past Surgical History:  Procedure Laterality Date  . HYSTERECTOMY ABDOMINAL WITH SALPINGECTOMY  2008  . THYROIDECTOMY Right 10/21/2018   Procedure: RIGHT HEMITHYROIDECTOMY;  Surgeon: Leta Baptist, MD;  Location: [facility];  Service: ENT;  Laterality: Right;    Current Outpatient Medications  Medication Sig Dispense Refill  . ALBUTEROL SULFATE IN Inhale 2 puffs into the lungs as needed (wheezing).     . FEROSUL 325 (65 Fe) MG tablet Take 325 mg by mouth 2 (two) times daily.    . furosemide (LASIX) 20 MG tablet Take 20 mg by mouth 2 (two) times daily.    . pantoprazole (PROTONIX) 40 MG tablet Take 40 mg by mouth daily.    . potassium chloride (KLOR-CON) 10 MEQ tablet Take 10 mEq by mouth 2 (two) times daily.      No current facility-administered medications for this visit.    Allergies as of 04/23/2020 - Review Complete 04/23/2020  Allergen Reaction Noted  . Percocet [oxycodone-acetaminophen] Hives 10/11/2018    Family History  Problem Relation Age of Onset  . Breast cancer Mother   . Other Paternal Grandfather        house fire  . Other Maternal Grandmother        house fire  . Congestive Heart Failure Maternal Grandfather   . Colon cancer Father 88  . Diverticulitis Brother   . Diverticulitis Sister   . Colon polyps Sister 64  . Diabetes Daughter        borderline    Social History   Socioeconomic History  . Marital status: Single    Spouse name: Not on file  . Number of children: Not on file  . Years of education: Not on file  . Highest education level: Not on file  Occupational History  . Not on file  Tobacco Use  . Smoking status: Former Smoker    Types: Cigarettes    Quit date: 2017    Years since quitting: 4.6  . Smokeless tobacco: Never Used  Vaping Use  . Vaping Use: Never used  Substance and Sexual Activity  . Alcohol use: Not Currently  . Drug use: Never  .  Sexual activity: Not on file    Comment: hyst  Other Topics Concern  . Not on file  Social History Narrative  . Not on file   Social Determinants of Health   Financial Resource Strain:   . Difficulty of Paying Living Expenses: Not on file  Food Insecurity:   . Worried About Charity fundraiser in the Last Year: Not on file  . Ran Out of Food in the Last Year: Not on file  Transportation Needs:   . Lack of Transportation (Medical): Not on file  . Lack of Transportation (Non-Medical): Not on file  Physical Activity:   . Days of Exercise per Week: Not on file  . Minutes of Exercise per Session: Not on file  Stress:   . Feeling of Stress : Not on file  Social Connections:   . Frequency of Communication with Friends and Family: Not on file  . Frequency of Social Gatherings with Friends and  Family: Not on file  . Attends Religious Services: Not on file  . Active Member of Clubs or Organizations: Not on file  . Attends Archivist Meetings: Not on file  . Marital Status: Not on file  Intimate Partner Violence:   . Fear of Current or Ex-Partner: Not on file  . Emotionally Abused: Not on file  . Physically Abused: Not on file  . Sexually Abused: Not on file    Review of Systems: Gen: Denies any fever, chills, fatigue, weight loss, lack of appetite.  CV: Denies chest pain, heart palpitations, peripheral edema, syncope.  Resp: +DOE GI: see HPI GU : Denies urinary burning, urinary frequency, urinary hesitancy MS: Denies joint pain, muscle weakness, cramps, or limitation of movement.  Derm: Denies rash, itching, dry skin Psych: Denies depression, anxiety, memory loss, and confusion Heme: Denies bruising, bleeding, and enlarged lymph nodes.  Physical Exam: BP 124/90   Pulse (!) 105   Temp (!) 97.5 F (36.4 C)   Ht 5\' 3"  (1.6 m)   Wt 292 lb 6.4 oz (132.6 kg)   BMI 51.80 kg/m  General:   Alert and oriented. Pleasant and cooperative. Well-nourished and well-developed.  Head:  Normocephalic and atraumatic. Eyes:  Without icterus, sclera clear and conjunctiva pink.  Ears:  Normal auditory acuity. Mouth:  Mask in place Lungs:  Clear to auscultation bilaterally. No wheezes, rales, or rhonchi. No distress.  Heart:  S1, S2 present with murmur Abdomen:  +BS, soft, obese, non-tender and non-distended. No HSM noted. No guarding or rebound. No masses appreciated.  Rectal:  Deferred  Msk:  Symmetrical without gross deformities. Normal posture. Extremities:  With lymphedema bilaterally Neurologic:  Alert and  oriented x4;  grossly normal neurologically. Skin:  Intact without significant lesions or rashes. Psych:  Alert and cooperative. Normal mood and affect.  ASSESSMENT: CORALEIGH SHEERAN is a 44 y.o. female presenting today with new onset normocytic anemia, low iron and  iron sats, elevated ferritin but query more reactant in this setting, unknown hemoccult status but denying any overt GI bleeding. She has no concerning upper or lower GI signs/symptoms. Notable family history of colon cancer in father in his 69s, metastatic and succumbing to disease a year later. No prior colonoscopy.   Needs diagnostic colonoscopy/EGD in near future. She does not have a menstrual cycle, as she had a hysterectomy in 2008.    PLAN: Proceed with TCS/EGD with Dr. Abbey Chatters in near future: the risks, benefits, and alternatives have been discussed with the patient in  detail. The patient states understanding and desires to proceed. ASA III  Hold iron 7 days prior  Further recommendations to follow  Annitta Needs, PhD, ANP-BC [facility] Gastroenter[facility]

## 2020-04-23 NOTE — Telephone Encounter (Signed)
PA for TCS/EGD submitted via [employer] website, case went to review. Request tracking ID: [id number]

## 2020-04-23 NOTE — Patient Instructions (Signed)
We are arranging a colonoscopy and upper endoscopy in the near future.  Please do not take iron for 7 days before the procedure.  Further recommendations to follow!  It was a pleasure to see you today. I want to create trusting relationships with patients to provide genuine, compassionate, and quality care. I value your feedback. If you receive a survey regarding your visit,  I greatly appreciate you taking time to fill this out.   Annitta Needs, PhD, ANP-BC [facility] Gastroenterology

## 2020-04-26 ENCOUNTER — Telehealth: Payer: Self-pay

## 2020-04-26 NOTE — Telephone Encounter (Signed)
Called pt, TCS/EGD scheduled for next available 06/08/20 at 1:15pm. Orders entered.

## 2020-04-26 NOTE — Telephone Encounter (Signed)
Received fax, TCS/EGD approved. PA# [id number], valid 04/26/20-05/27/20.

## 2020-04-26 NOTE — Telephone Encounter (Signed)
Opened in error

## 2020-04-26 NOTE — Telephone Encounter (Signed)
Lauren Osborn at [place] gave ok for Dr. Abbey Chatters to do ASAP TCS/EGD ASA 3 04/29/20.  Called pt, offered ASAP TCS/EGD 04/29/20. Pt said no to 04/29/20. States she has to work and she needs more of a notice as she has to get someone to work for her. Advised her we will call her if someone cancels as 04/29/20 was only opening at this time and it may be few days notice.  FYI to Lauren Kaufman NP and [employer].

## 2020-04-26 NOTE — Telephone Encounter (Signed)
Noted.  I would offer patient the next available appointment

## 2020-04-27 NOTE — Telephone Encounter (Signed)
Pre-op/covid test 06/07/20. Appt letter mailed with procedure instructions.

## 2020-05-25 ENCOUNTER — Encounter: Payer: Self-pay | Admitting: *Deleted

## 2020-05-25 ENCOUNTER — Telehealth: Payer: Self-pay | Admitting: Internal Medicine

## 2020-05-25 NOTE — Telephone Encounter (Signed)
LMOVM for pt 

## 2020-05-25 NOTE — Telephone Encounter (Signed)
Pt called to see if she was scheduled both EGD and colonoscopy. I told her that she was scheduled for both on 10/5 with Dr Abbey Chatters. Then she had questions about her prep. I told her the prep was on back order and what she has was all OTC to use as her prep. Then she said her minor daughter was going to court and she needed to reschedule. Please call [phone]

## 2020-05-25 NOTE — Telephone Encounter (Signed)
Called pt. She has been rescheduled to 11/30 at [place] will mail new prep instructions with covid/pre-op appt. Confirmed mailing address. Called endo and made aware.

## 2020-06-07 ENCOUNTER — Other Ambulatory Visit (HOSPITAL_COMMUNITY): Payer: 59

## 2020-07-16 ENCOUNTER — Encounter: Payer: 59 | Admitting: Vascular Surgery

## 2020-07-16 ENCOUNTER — Encounter (HOSPITAL_COMMUNITY): Payer: 59

## 2020-07-27 NOTE — Patient Instructions (Signed)
Lauren Osborn  07/27/2020     @PREFPERIOPPHARMACY @   Your procedure is scheduled on   08/03/2020  Report to [facility] at  [place].  Call this number if you have problems the morning of surgery:  [phone]   Remember:  Follow the diet and prep instructions given to you by the office.                       Take these medicines the morning of surgery with A SIP OF WATER  None    Do not wear jewelry, make-up or nail polish.  Do not wear lotions, powders, or perfumes. Please wear deodorant and brush your teeth.  Do not shave 48 hours prior to surgery.  Men may shave face and neck.  Do not bring valuables to the hospital.  [facility] is not responsible for any belongings or valuables.  Contacts, dentures or bridgework may not be worn into surgery.  Leave your suitcase in the car.  After surgery it may be brought to your room.  For patients admitted to the hospital, discharge time will be determined by your treatment team.  Patients discharged the day of surgery will not be allowed to drive home.   Name and phone number of your driver:   family Special instructions:  DO NOT smoke the morning of your procedure.  Please read over the following fact sheets that you were given. Anesthesia Post-op Instructions and Care and Recovery After Surgery       Upper Endoscopy, Adult, Care After This sheet gives you information about how to care for yourself after your procedure. Your health care provider may also give you more specific instructions. If you have problems or questions, contact your health care provider. What can I expect after the procedure? After the procedure, it is common to have:  A sore throat.  Mild stomach pain or discomfort.  Bloating.  Nausea. Follow these instructions at home:   Follow instructions from your health care provider about what to eat or drink after your procedure.  Return to your normal activities as told by your health  care provider. Ask your health care provider what activities are safe for you.  Take over-the-counter and prescription medicines only as told by your health care provider.  Do not drive for 24 hours if you were given a sedative during your procedure.  Keep all follow-up visits as told by your health care provider. This is important. Contact a health care provider if you have:  A sore throat that lasts longer than one day.  Trouble swallowing. Get help right away if:  You vomit blood or your vomit looks like coffee grounds.  You have: ? A fever. ? Bloody, black, or tarry stools. ? A severe sore throat or you cannot swallow. ? Difficulty breathing. ? Severe pain in your chest or abdomen. Summary  After the procedure, it is common to have a sore throat, mild stomach discomfort, bloating, and nausea.  Do not drive for 24 hours if you were given a sedative during the procedure.  Follow instructions from your health care provider about what to eat or drink after your procedure.  Return to your normal activities as told by your health care provider. This information is not intended to replace advice given to you by your health care provider. Make sure you discuss any questions you have with your health care provider. Document  Revised: 02/12/2018 Document Reviewed: 01/21/2018 Elsevier Patient Education  [place].  Colonoscopy, Adult, Care After This sheet gives you information about how to care for yourself after your procedure. Your health care provider may also give you more specific instructions. If you have problems or questions, contact your health care provider. What can I expect after the procedure? After the procedure, it is common to have:  A small amount of blood in your stool for 24 hours after the procedure.  Some gas.  Mild cramping or bloating of your abdomen. Follow these instructions at home: Eating and drinking   Drink enough fluid to keep your  urine pale yellow.  Follow instructions from your health care provider about eating or drinking restrictions.  Resume your normal diet as instructed by your health care provider. Avoid heavy or fried foods that are hard to digest. Activity  Rest as told by your health care provider.  Avoid sitting for a long time without moving. Get up to take short walks every 1-2 hours. This is important to improve blood flow and breathing. Ask for help if you feel weak or unsteady.  Return to your normal activities as told by your health care provider. Ask your health care provider what activities are safe for you. Managing cramping and bloating   Try walking around when you have cramps or feel bloated.  Apply heat to your abdomen as told by your health care provider. Use the heat source that your health care provider recommends, such as a moist heat pack or a heating pad. ? Place a towel between your skin and the heat source. ? Leave the heat on for 20-30 minutes. ? Remove the heat if your skin turns bright red. This is especially important if you are unable to feel pain, heat, or cold. You may have a greater risk of getting burned. General instructions  For the first 24 hours after the procedure: ? Do not drive or use machinery. ? Do not sign important documents. ? Do not drink alcohol. ? Do your regular daily activities at a slower pace than normal. ? Eat soft foods that are easy to digest.  Take over-the-counter and prescription medicines only as told by your health care provider.  Keep all follow-up visits as told by your health care provider. This is important. Contact a health care provider if:  You have blood in your stool 2-3 days after the procedure. Get help right away if you have:  More than a small spotting of blood in your stool.  Large blood clots in your stool.  Swelling of your abdomen.  Nausea or vomiting.  A fever.  Increasing pain in your abdomen that is not  relieved with medicine. Summary  After the procedure, it is common to have a small amount of blood in your stool. You may also have mild cramping and bloating of your abdomen.  For the first 24 hours after the procedure, do not drive or use machinery, sign important documents, or drink alcohol.  Get help right away if you have a lot of blood in your stool, nausea or vomiting, a fever, or increased pain in your abdomen. This information is not intended to replace advice given to you by your health care provider. Make sure you discuss any questions you have with your health care provider. Document Revised: 03/17/2019 Document Reviewed: 03/17/2019 Elsevier Patient Education  [place] After These instructions provide you with information about caring for yourself  after your procedure. Your health care provider may also give you more specific instructions. Your treatment has been planned according to current medical practices, but problems sometimes occur. Call your health care provider if you have any problems or questions after your procedure. What can I expect after the procedure? After your procedure, you may:  Feel sleepy for several hours.  Feel clumsy and have poor balance for several hours.  Feel forgetful about what happened after the procedure.  Have poor judgment for several hours.  Feel nauseous or vomit.  Have a sore throat if you had a breathing tube during the procedure. Follow these instructions at home: For at least 24 hours after the procedure:      Have a responsible adult stay with you. It is important to have someone help care for you until you are awake and alert.  Rest as needed.  Do not: ? Participate in activities in which you could fall or become injured. ? Drive. ? Use heavy machinery. ? Drink alcohol. ? Take sleeping pills or medicines that cause drowsiness. ? Make important decisions or sign legal  documents. ? Take care of children on your own. Eating and drinking  Follow the diet that is recommended by your health care provider.  If you vomit, drink water, juice, or soup when you can drink without vomiting.  Make sure you have little or no nausea before eating solid foods. General instructions  Take over-the-counter and prescription medicines only as told by your health care provider.  If you have sleep apnea, surgery and certain medicines can increase your risk for breathing problems. Follow instructions from your health care provider about wearing your sleep device: ? Anytime you are sleeping, including during daytime naps. ? While taking prescription pain medicines, sleeping medicines, or medicines that make you drowsy.  If you smoke, do not smoke without supervision.  Keep all follow-up visits as told by your health care provider. This is important. Contact a health care provider if:  You keep feeling nauseous or you keep vomiting.  You feel light-headed.  You develop a rash.  You have a fever. Get help right away if:  You have trouble breathing. Summary  For several hours after your procedure, you may feel sleepy and have poor judgment.  Have a responsible adult stay with you for at least 24 hours or until you are awake and alert. This information is not intended to replace advice given to you by your health care provider. Make sure you discuss any questions you have with your health care provider. Document Revised: 11/19/2017 Document Reviewed: 12/12/2015 Elsevier Patient Education  [place].

## 2020-08-02 ENCOUNTER — Other Ambulatory Visit: Payer: Self-pay

## 2020-08-02 ENCOUNTER — Other Ambulatory Visit (HOSPITAL_COMMUNITY)
Admission: RE | Admit: 2020-08-02 | Discharge: 2020-08-02 | Disposition: A | Discharge status: Home / Self Care | Payer: 59 | Source: Ambulatory Visit | Attending: Internal Medicine | Admitting: Internal Medicine

## 2020-08-02 ENCOUNTER — Encounter (HOSPITAL_COMMUNITY): Payer: Self-pay

## 2020-08-02 ENCOUNTER — Encounter (HOSPITAL_COMMUNITY)
Admission: RE | Admit: 2020-08-02 | Discharge: 2020-08-02 | Disposition: A | Discharge status: Home / Self Care | Payer: 59 | Source: Ambulatory Visit | Attending: Internal Medicine | Admitting: Internal Medicine

## 2020-08-02 DIAGNOSIS — Z20822 Contact with and (suspected) exposure to covid-19: Secondary | ICD-10-CM | POA: Insufficient documentation

## 2020-08-02 DIAGNOSIS — Z01812 Encounter for preprocedural laboratory examination: Secondary | ICD-10-CM | POA: Diagnosis not present

## 2020-08-02 LAB — CBC WITH DIFFERENTIAL/PLATELET
Abs Immature Granulocytes: 0.02 10*3/uL (ref 0.00–0.07)
Basophils Absolute: 0.1 10*3/uL (ref 0.0–0.1)
Basophils Relative: 1 %
Eosinophils Absolute: 0.1 10*3/uL (ref 0.0–0.5)
Eosinophils Relative: 2 %
HCT: 40.9 % (ref 36.0–46.0)
Hemoglobin: 12.7 g/dL (ref 12.0–15.0)
Immature Granulocytes: 0 %
Lymphocytes Relative: 18 %
Lymphs Abs: 1.3 10*3/uL (ref 0.7–4.0)
MCH: 28.7 pg (ref 26.0–34.0)
MCHC: 31.1 g/dL (ref 30.0–36.0)
MCV: 92.5 fL (ref 80.0–100.0)
Monocytes Absolute: 0.5 10*3/uL (ref 0.1–1.0)
Monocytes Relative: 7 %
Neutro Abs: 5.4 10*3/uL (ref 1.7–7.7)
Neutrophils Relative %: 72 %
Platelets: 257 10*3/uL (ref 150–400)
RBC: 4.42 MIL/uL (ref 3.87–5.11)
RDW: 14.7 % (ref 11.5–15.5)
WBC: 7.4 10*3/uL (ref 4.0–10.5)
nRBC: 0 % (ref 0.0–0.2)

## 2020-08-02 LAB — BASIC METABOLIC PANEL
Anion gap: 12 (ref 5–15)
BUN: 14 mg/dL (ref 6–20)
CO2: 28 mmol/L (ref 22–32)
Calcium: 9.1 mg/dL (ref 8.9–10.3)
Chloride: 97 mmol/L — ABNORMAL LOW (ref 98–111)
Creatinine, Ser: 0.99 mg/dL (ref 0.44–1.00)
GFR, Estimated: >60 mL/min (ref >60)
Glucose, Bld: 108 mg/dL — ABNORMAL HIGH (ref 70–99)
Potassium: 3.3 mmol/L — ABNORMAL LOW (ref 3.5–5.1)
Sodium: 137 mmol/L (ref 135–145)

## 2020-08-02 LAB — SARS CORONAVIRUS 2 (TAT 6-24 HRS): SARS Coronavirus 2: NEGATIVE

## 2020-08-03 ENCOUNTER — Ambulatory Visit (HOSPITAL_COMMUNITY)
Admission: RE | Admit: 2020-08-03 | Discharge: 2020-08-03 | Disposition: A | Discharge status: Home / Self Care | Payer: 59 | Attending: Internal Medicine | Admitting: Internal Medicine

## 2020-08-03 ENCOUNTER — Encounter (HOSPITAL_COMMUNITY): Payer: Self-pay

## 2020-08-03 ENCOUNTER — Ambulatory Visit (HOSPITAL_COMMUNITY): Payer: 59

## 2020-08-03 ENCOUNTER — Encounter (HOSPITAL_COMMUNITY)
Admission: RE | Disposition: A | Discharge status: Home / Self Care | Payer: Self-pay | Source: Home / Self Care | Attending: Internal Medicine

## 2020-08-03 DIAGNOSIS — K648 Other hemorrhoids: Secondary | ICD-10-CM | POA: Insufficient documentation

## 2020-08-03 DIAGNOSIS — Z885 Allergy status to narcotic agent status: Secondary | ICD-10-CM | POA: Diagnosis not present

## 2020-08-03 DIAGNOSIS — K635 Polyp of colon: Secondary | ICD-10-CM

## 2020-08-03 DIAGNOSIS — D125 Benign neoplasm of sigmoid colon: Secondary | ICD-10-CM | POA: Diagnosis not present

## 2020-08-03 DIAGNOSIS — Z79899 Other long term (current) drug therapy: Secondary | ICD-10-CM | POA: Insufficient documentation

## 2020-08-03 DIAGNOSIS — K319 Disease of stomach and duodenum, unspecified: Secondary | ICD-10-CM | POA: Insufficient documentation

## 2020-08-03 DIAGNOSIS — Z87891 Personal history of nicotine dependence: Secondary | ICD-10-CM | POA: Diagnosis not present

## 2020-08-03 DIAGNOSIS — K2100 Gastro-esophageal reflux disease with esophagitis, without bleeding: Secondary | ICD-10-CM | POA: Insufficient documentation

## 2020-08-03 DIAGNOSIS — D509 Iron deficiency anemia, unspecified: Secondary | ICD-10-CM | POA: Diagnosis not present

## 2020-08-03 DIAGNOSIS — K2970 Gastritis, unspecified, without bleeding: Secondary | ICD-10-CM | POA: Diagnosis not present

## 2020-08-03 DIAGNOSIS — I10 Essential (primary) hypertension: Secondary | ICD-10-CM | POA: Diagnosis not present

## 2020-08-03 HISTORY — PX: ESOPHAGOGASTRODUODENOSCOPY (EGD) WITH PROPOFOL: SHX5813

## 2020-08-03 HISTORY — PX: POLYPECTOMY: SHX5525

## 2020-08-03 HISTORY — PX: BIOPSY: SHX5522

## 2020-08-03 HISTORY — PX: COLONOSCOPY WITH PROPOFOL: SHX5780

## 2020-08-03 SURGERY — COLONOSCOPY WITH PROPOFOL
Anesthesia: General

## 2020-08-03 MED ORDER — CHLORHEXIDINE GLUCONATE CLOTH 2 % EX PADS: 6.0000 | MEDICATED_PAD | Freq: Once | CUTANEOUS | Status: DC

## 2020-08-03 MED ORDER — GLYCOPYRROLATE 0.2 MG/ML IJ SOLN
INTRAMUSCULAR | Status: AC
  Filled 2020-08-03: qty 1

## 2020-08-03 MED ORDER — PROPOFOL 500 MG/50ML IV EMUL
INTRAVENOUS | Status: DC | PRN
  Administered 2020-08-03: 100 ug/kg/min via INTRAVENOUS
  Administered 2020-08-03: 75 ug/kg/min via INTRAVENOUS

## 2020-08-03 MED ORDER — LIDOCAINE VISCOUS HCL 2 % MT SOLN
15.0000 mL | Freq: Once | OROMUCOSAL | Status: AC
  Administered 2020-08-03: 15 mL via OROMUCOSAL

## 2020-08-03 MED ORDER — LACTATED RINGERS IV SOLN: INTRAVENOUS | Status: DC

## 2020-08-03 MED ORDER — GLYCOPYRROLATE 0.2 MG/ML IJ SOLN
0.2000 mg | Freq: Once | INTRAMUSCULAR | Status: AC
  Administered 2020-08-03: 0.2 mg via INTRAVENOUS

## 2020-08-03 MED ORDER — OMEPRAZOLE 20 MG PO CPDR: 20.0000 mg | DELAYED_RELEASE_CAPSULE | Freq: Two times a day (BID) | ORAL | 5 refills | Status: DC

## 2020-08-03 MED ORDER — PROPOFOL 10 MG/ML IV BOLUS
INTRAVENOUS | Status: DC | PRN
  Administered 2020-08-03: 40 mg via INTRAVENOUS
  Administered 2020-08-03: 160 mg via INTRAVENOUS

## 2020-08-03 MED ORDER — LIDOCAINE HCL (CARDIAC) PF 100 MG/5ML IV SOSY
PREFILLED_SYRINGE | INTRAVENOUS | Status: DC | PRN
  Administered 2020-08-03: 50 mg via INTRAVENOUS

## 2020-08-03 MED ORDER — LIDOCAINE VISCOUS HCL 2 % MT SOLN
OROMUCOSAL | Status: AC
  Filled 2020-08-03: qty 15

## 2020-08-03 NOTE — Op Note (Signed)
[facility] Patient Name: Lauren Osborn Procedure Date: 08/03/2020 8:10 AM MRN: [id number] Date of Birth: 1976-02-11 Attending MD: Elon Alas. Abbey Chatters DO CSN: [id number] Age: 44 Admit Type: Outpatient Procedure:                Colonoscopy Indications:              Iron deficiency anemia Providers:                Elon Alas. Ammy Lienhard, DO, Otis Peak B. Sharon Seller, RN,                            Lambert Mody, Nelma Rothman, Technician Referring MD:              Medicines:                See the Anesthesia note for documentation of the                            administered medications Complications:            No immediate complications. Estimated Blood Loss:     Estimated blood loss was minimal. Estimated blood                            loss was minimal. Procedure:                Pre-Anesthesia Assessment:                           - The anesthesia plan was to use monitored                            anesthesia care (MAC).                           After obtaining informed consent, the colonoscope                            was passed under direct vision. Throughout the                            procedure, the patient's blood pressure, pulse, and                            oxygen saturations were monitored continuously. The                            [id number]-H190DL ([id number]) scope was introduced through                            the anus and advanced to the the cecum, identified                            by appendiceal orifice and ileocecal valve. The                            colonoscopy was performed without difficulty.  The                            patient tolerated the procedure well. The quality                            of the bowel preparation was evaluated using the                            BBPS [facility] Bowel Preparation Scale) with scores                            of: Right Colon = 2 (minor amount of residual                            staining, small fragments of stool  and/or opaque                            liquid, but mucosa seen well), Transverse Colon = 2                            (minor amount of residual staining, small fragments                            of stool and/or opaque liquid, but mucosa seen                            well) and Left Colon = 2 (minor amount of residual                            staining, small fragments of stool and/or opaque                            liquid, but mucosa seen well). The total BBPS score                            equals 6. The quality of the bowel preparation was                            fair. Scope In: 8:12:41 AM Scope Out: 8:25:35 AM Scope Withdrawal Time: 0 hours 10 minutes 16 seconds  Total Procedure Duration: 0 hours 12 minutes 54 seconds  Findings:      The perianal and digital rectal examinations were normal.      Non-bleeding internal hemorrhoids were found during endoscopy.      A 5 mm polyp was found in the sigmoid colon. The polyp was sessile. The       polyp was removed with a cold snare. Resection and retrieval were       complete.      The exam was otherwise without abnormality. Impression:               - Preparation of the colon was fair.                           -  Non-bleeding internal hemorrhoids.                           - One 5 mm polyp in the sigmoid colon, removed with                            a cold snare. Resected and retrieved.                           - The examination was otherwise normal. Moderate Sedation:      Per Anesthesia Care Recommendation:           - Patient has a contact number available for                            emergencies. The signs and symptoms of potential                            delayed complications were discussed with the                            patient. Return to normal activities tomorrow.                            Written discharge instructions were provided to the                            patient.                           -  Resume previous diet.                           - Continue present medications.                           - Await pathology results.                           - Repeat colonoscopy in 5 years for surveillance.                           - Return to GI clinic in 3 weeks.                           - If anemia does not improve as expected with iron                            supp. and treatment of gastritis/ulcers, recommend                            small bowel capsule endoscopy. Procedure Code(s):        --- Professional ---                           [phone], Colonoscopy, flexible; with removal of  tumor(s), polyp(s), or other lesion(s) by snare                            technique Diagnosis Code(s):        --- Professional ---                           K64.8, Other hemorrhoids                           K63.5, Polyp of colon                           D50.9, Iron deficiency anemia, unspecified CPT copyright 2019 American Medical Association. All rights reserved. The codes documented in this report are preliminary and upon coder review may  be revised to meet current compliance requirements. Elon Alas. Abbey Chatters, DO [place] Abbey Chatters, DO 08/03/2020 8:29:27 AM This report has been signed electronically. Number of Addenda: 0

## 2020-08-03 NOTE — Anesthesia Postprocedure Evaluation (Signed)
Anesthesia Post Note  Patient: DARYL QUIROS  Procedure(s) Performed: COLONOSCOPY WITH PROPOFOL (N/A ) ESOPHAGOGASTRODUODENOSCOPY (EGD) WITH PROPOFOL (N/A ) BIOPSY POLYPECTOMY  Patient location during evaluation: PACU Anesthesia Type: General Level of consciousness: awake and alert and patient cooperative Pain management: satisfactory to patient Vital Signs Assessment: post-procedure vital signs reviewed and stable Respiratory status: spontaneous breathing Cardiovascular status: stable Postop Assessment: no apparent nausea or vomiting Anesthetic complications: no   No complications documented.   Last Vitals:  Vitals:   08/03/20 0712 08/03/20 0833  BP: (!) 127/93 (!) 92/37  Pulse: 96 86  Resp: (!) 21 (!) 23  Temp: 37.1 C 36.5 C  SpO2: 96% 95%    Last Pain:  Vitals:   08/03/20 0833  TempSrc:   PainSc: 0-No pain                 Sariyah Corcino

## 2020-08-03 NOTE — Anesthesia Preprocedure Evaluation (Signed)
Anesthesia Evaluation  Patient identified by MRN, date of birth, ID band Patient awake    Reviewed: Allergy & Precautions, H&P , NPO status , Patient's Chart, lab work & pertinent test results, reviewed documented beta blocker date and time   Airway Mallampati: II  TM Distance: >3 FB Neck ROM: full    Dental no notable dental hx.    Pulmonary neg pulmonary ROS, former smoker,    Pulmonary exam normal breath sounds clear to auscultation       Cardiovascular Exercise Tolerance: Good hypertension, negative cardio ROS   Rhythm:regular Rate:Normal     Neuro/Psych negative neurological ROS  negative psych ROS   GI/Hepatic negative GI ROS, Neg liver ROS,   Endo/Other  Morbid obesity  Renal/GU negative Renal ROS  negative genitourinary   Musculoskeletal   Abdominal   Peds  Hematology  (+) Blood dyscrasia, anemia ,   Anesthesia Other Findings   Reproductive/Obstetrics negative OB ROS                             Anesthesia Physical Anesthesia Plan  ASA: III  Anesthesia Plan: General   Post-op Pain Management:    Induction:   PONV Risk Score and Plan: Propofol infusion  Airway Management Planned:   Additional Equipment:   Intra-op Plan:   Post-operative Plan:   Informed Consent: I have reviewed the patients History and Physical, chart, labs and discussed the procedure including the risks, benefits and alternatives for the proposed anesthesia with the patient or authorized representative who has indicated his/her understanding and acceptance.     Dental Advisory Given  Plan Discussed with: CRNA  Anesthesia Plan Comments:         Anesthesia Quick Evaluation

## 2020-08-03 NOTE — Transfer of Care (Signed)
Immediate Anesthesia Transfer of Care Note  Patient: Lauren Osborn  Procedure(s) Performed: COLONOSCOPY WITH PROPOFOL (N/A ) ESOPHAGOGASTRODUODENOSCOPY (EGD) WITH PROPOFOL (N/A ) BIOPSY POLYPECTOMY  Patient Location: PACU  Anesthesia Type:General  Level of Consciousness: awake and patient cooperative  Airway & Oxygen Therapy: Patient Spontanous Breathing  Post-op Assessment: Report given to RN and Post -op Vital signs reviewed and stable  Post vital signs: Reviewed and stable  Last Vitals:  Vitals Value Taken Time  BP 92/37 08/03/20 0832  Temp 97.7   Pulse 86 08/03/20 0833  Resp 23 08/03/20 0833  SpO2 95 % 08/03/20 0833  Vitals shown include unvalidated device data.  Last Pain:  Vitals:   08/03/20 0758  TempSrc:   PainSc: 0-No pain         Complications: No complications documented.

## 2020-08-03 NOTE — Anesthesia Procedure Notes (Signed)
Date/Time: 08/03/2020 7:57 AM Performed by: Vista Deck, CRNA Pre-anesthesia Checklist: Patient identified, Emergency Drugs available, Suction available, Timeout performed and Patient being monitored Patient Re-evaluated:Patient Re-evaluated prior to induction Oxygen Delivery Method: Nasal Cannula

## 2020-08-03 NOTE — Op Note (Signed)
[facility] Patient Name: Lauren Osborn Procedure Date: 08/03/2020 7:50 AM MRN: [id number] Date of Birth: [birth year] Attending MD: Elon Alas. Abbey Chatters DO CSN: [id number] Age: 44 Admit Type: Outpatient Procedure:                Upper GI endoscopy Indications:              Iron deficiency anemia Providers:                Elon Alas. Miroslav Gin, DO, Otis Peak B. Sharon Seller, RN,                            Lambert Mody, Nelma Rothman, Technician Referring MD:              Medicines:                See the Anesthesia note for documentation of the                            administered medications Complications:            No immediate complications. Estimated Blood Loss:     Estimated blood loss was minimal. Procedure:                Pre-Anesthesia Assessment:                           - The anesthesia plan was to use monitored                            anesthesia care (MAC).                           After obtaining informed consent, the endoscope was                            passed under direct vision. Throughout the                            procedure, the patient's blood pressure, pulse, and                            oxygen saturations were monitored continuously. The                            [id number]-H190 ([id number]) scope was introduced through the                            mouth, and advanced to the second part of duodenum.                            The upper GI endoscopy was accomplished without                            difficulty. The patient tolerated the procedure                            well.  Scope In: 8:03:02 AM Scope Out: 8:06:48 AM Total Procedure Duration: 0 hours 3 minutes 46 seconds  Findings:      LA Grade C (one or more mucosal breaks continuous between tops of 2 or       more mucosal folds, less than 75% circumference) esophagitis with no       bleeding was found at the gastroesophageal junction.      Localized moderate inflammation characterized by erosions,  erythema and       shallow ulcerations was found in the gastric antrum. Biopsies were taken       with a cold forceps for Helicobacter pylori testing.      The duodenal bulb, first portion of the duodenum and second portion of       the duodenum were normal. Biopsies for histology were taken with a cold       forceps for evaluation of celiac disease. Impression:               - LA Grade C reflux esophagitis with no bleeding.                           - Gastritis. Biopsied.                           - Normal duodenal bulb, first portion of the                            duodenum and second portion of the duodenum.                            Biopsied. Moderate Sedation:      Per Anesthesia Care Recommendation:           - Patient has a contact number available for                            emergencies. The signs and symptoms of potential                            delayed complications were discussed with the                            patient. Return to normal activities tomorrow.                            Written discharge instructions were provided to the                            patient.                           - Resume previous diet.                           - Continue present medications.                           - Await pathology results.                           -  Use a proton pump inhibitor PO BID.                           - Return to GI clinic in 3 months. Procedure Code(s):        --- Professional ---                           [phone], Esophagogastroduodenoscopy, flexible,                            transoral; with biopsy, single or multiple Diagnosis Code(s):        --- Professional ---                           K21.00, Gastro-esophageal reflux disease with                            esophagitis, without bleeding                           K29.70, Gastritis, unspecified, without bleeding                           D50.9, Iron deficiency anemia, unspecified CPT  copyright 2019 American Medical Association. All rights reserved. The codes documented in this report are preliminary and upon coder review may  be revised to meet current compliance requirements. Elon Alas. Abbey Chatters, DO [place] Abbey Chatters, DO 08/03/2020 8:09:38 AM This report has been signed electronically. Number of Addenda: 0

## 2020-08-03 NOTE — H&P (Signed)
Primary Care Physician:  Dara Lords, NP Primary Gastroenterologist:  Dr. Abbey Chatters  Pre-Procedure History & Physical: HPI:  Lauren Osborn is a 44 y.o. female is here  an EGD and colonoscopy to be performed for normocytic anemia, low iron and iron sats, elevated ferritin but query more reactant in this setting, unknown hemoccult status but denying any overt GI bleeding.  Patient denies any family history of colorectal cancer. No abdominal pain or unintentional weight loss.  No change in bowel habits.  Overall feels well from a GI standpoint.  Past Medical History:  Diagnosis Date  . Essential hypertension   . Fibroids   . Gout   . Thyroid nodule     Past Surgical History:  Procedure Laterality Date  . CESAREAN SECTION    . HYSTERECTOMY ABDOMINAL WITH SALPINGECTOMY  2008  . THYROIDECTOMY Right 10/21/2018   Procedure: RIGHT HEMITHYROIDECTOMY;  Surgeon: Leta Baptist, MD;  Location: [facility];  Service: ENT;  Laterality: Right;    Prior to Admission medications   Medication Sig Start Date End Date Taking? Authorizing Provider  acetaminophen (TYLENOL) 500 MG tablet Take 1,000 mg by mouth every 6 (six) hours as needed for moderate pain or headache.   Yes [provider]  calcium carbonate (TUMS - DOSED IN MG ELEMENTAL CALCIUM) 500 MG chewable tablet Chew 2 tablets by mouth daily as needed for indigestion or heartburn.   Yes [provider]  FEROSUL 325 (65 Fe) MG tablet Take 325 mg by mouth 2 (two) times daily. 02/14/20  Yes [provider]  furosemide (LASIX) 40 MG tablet Take 40 mg by mouth daily.   Yes [provider]  potassium chloride (KLOR-CON) 10 MEQ tablet Take 10 mEq by mouth 2 (two) times daily.   Yes [provider]  spironolactone (ALDACTONE) 25 MG tablet Take 25 mg by mouth daily.   Yes [provider]    Allergies as of 04/26/2020 - Review Complete 04/23/2020  Allergen Reaction Noted  . Percocet  [oxycodone-acetaminophen] Hives 10/11/2018    Family History  Problem Relation Age of Onset  . Breast cancer Mother   . Other Paternal Grandfather        house fire  . Other Maternal Grandmother        house fire  . Congestive Heart Failure Maternal Grandfather   . Colon cancer Father 28  . Diverticulitis Brother   . Diverticulitis Sister   . Colon polyps Sister 47  . Diabetes Daughter        borderline    Social History   Socioeconomic History  . Marital status: Single    Spouse name: Not on file  . Number of children: Not on file  . Years of education: Not on file  . Highest education level: Not on file  Occupational History  . Not on file  Tobacco Use  . Smoking status: Former Smoker    Packs/day: 0.50    Years: 20.00    Pack years: 10.00    Types: Cigarettes    Quit date: 2017    Years since quitting: 4.9  . Smokeless tobacco: Never Used  Vaping Use  . Vaping Use: Never used  Substance and Sexual Activity  . Alcohol use: Not Currently  . Drug use: Never  . Sexual activity: Yes    Comment: hyst  Other Topics Concern  . Not on file  Social History Narrative  . Not on file   Social Determinants of Health  Financial Resource Strain:   . Difficulty of Paying Living Expenses: Not on file  Food Insecurity:   . Worried About Charity fundraiser in the Last Year: Not on file  . Ran Out of Food in the Last Year: Not on file  Transportation Needs:   . Lack of Transportation (Medical): Not on file  . Lack of Transportation (Non-Medical): Not on file  Physical Activity:   . Days of Exercise per Week: Not on file  . Minutes of Exercise per Session: Not on file  Stress:   . Feeling of Stress : Not on file  Social Connections:   . Frequency of Communication with Friends and Family: Not on file  . Frequency of Social Gatherings with Friends and Family: Not on file  . Attends Religious Services: Not on file  . Active Member of Clubs or Organizations: Not on  file  . Attends Archivist Meetings: Not on file  . Marital Status: Not on file  Intimate Partner Violence:   . Fear of Current or Ex-Partner: Not on file  . Emotionally Abused: Not on file  . Physically Abused: Not on file  . Sexually Abused: Not on file    Review of Systems: See HPI, otherwise negative ROS  Impression/Plan: Lauren Osborn is here for an EGD and colonoscopy to be performed for normocytic anemia, low iron and iron sats, elevated ferritin but query more reactant in this setting, unknown hemoccult status but denying any overt GI bleeding.  The risks of the procedure including infection, bleed, or perforation as well as benefits, limitations, alternatives and imponderables have been reviewed with the patient. Questions have been answered. All parties agreeable.

## 2020-08-03 NOTE — Discharge Instructions (Addendum)
EGD Discharge instructions Please read the instructions outlined below and refer to this sheet in the next few weeks. These discharge instructions provide you with general information on caring for yourself after you leave the hospital. Your doctor may also give you specific instructions. While your treatment has been planned according to the most current medical practices available, unavoidable complications occasionally occur. If you have any problems or questions after discharge, please call your doctor. ACTIVITY  You may resume your regular activity but move at a slower pace for the next 24 hours.   Take frequent rest periods for the next 24 hours.   Walking will help expel (get rid of) the air and reduce the bloated feeling in your abdomen.   No driving for 24 hours (because of the anesthesia (medicine) used during the test).   You may shower.   Do not sign any important legal documents or operate any machinery for 24 hours (because of the anesthesia used during the test).  NUTRITION  Drink plenty of fluids.   You may resume your normal diet.   Begin with a light meal and progress to your normal diet.   Avoid alcoholic beverages for 24 hours or as instructed by your caregiver.  MEDICATIONS  You may resume your normal medications unless your caregiver tells you otherwise.  WHAT YOU CAN EXPECT TODAY  You may experience abdominal discomfort such as a feeling of fullness or "gas" pains.  FOLLOW-UP  Your doctor will discuss the results of your test with you.  SEEK IMMEDIATE MEDICAL ATTENTION IF ANY OF THE FOLLOWING OCCUR:  Excessive nausea (feeling sick to your stomach) and/or vomiting.   Severe abdominal pain and distention (swelling).   Trouble swallowing.   Temperature over 101 F (37.8 C).   Rectal bleeding or vomiting of blood.     Colonoscopy Discharge Instructions  Read the instructions outlined below and refer to this sheet in the next few weeks. These  discharge instructions provide you with general information on caring for yourself after you leave the hospital. Your doctor may also give you specific instructions. While your treatment has been planned according to the most current medical practices available, unavoidable complications occasionally occur.   ACTIVITY  You may resume your regular activity, but move at a slower pace for the next 24 hours.   Take frequent rest periods for the next 24 hours.   Walking will help get rid of the air and reduce the bloated feeling in your belly (abdomen).   No driving for 24 hours (because of the medicine (anesthesia) used during the test).    Do not sign any important legal documents or operate any machinery for 24 hours (because of the anesthesia used during the test).  NUTRITION  Drink plenty of fluids.   You may resume your normal diet as instructed by your doctor.   Begin with a light meal and progress to your normal diet. Heavy or fried foods are harder to digest and may make you feel sick to your stomach (nauseated).   Avoid alcoholic beverages for 24 hours or as instructed.  MEDICATIONS  You may resume your normal medications unless your doctor tells you otherwise.  WHAT YOU CAN EXPECT TODAY  Some feelings of bloating in the abdomen.   Passage of more gas than usual.   Spotting of blood in your stool or on the toilet paper.  IF YOU HAD POLYPS REMOVED DURING THE COLONOSCOPY:  No aspirin products for 7 days or as instructed.  No alcohol for 7 days or as instructed.   Eat a soft diet for the next 24 hours.  FINDING OUT THE RESULTS OF YOUR TEST Not all test results are available during your visit. If your test results are not back during the visit, make an appointment with your caregiver to find out the results. Do not assume everything is normal if you have not heard from your caregiver or the medical facility. It is important for you to follow up on all of your test results.    SEEK IMMEDIATE MEDICAL ATTENTION IF:  You have more than a spotting of blood in your stool.   Your belly is swollen (abdominal distention).   You are nauseated or vomiting.   You have a temperature over 101.   You have abdominal pain or discomfort that is severe or gets worse throughout the day.   Your EGD showed gastritis with ulcers as well as esophagitis likely due to reflux.  Possible this is what is causing your anemia.  I am going to start you on omeprazole 20 mg twice daily, take 30 minutes before breakfast and 30 minutes before supper.  I did take biopsies to rule out infections and celiac disease.  We should have these results back by the end of the week.  My office will contact you.  Your colonoscopy revealed internal hemorrhoids and one small polyp which I removed successfully.  I would recommend we repeat this in 5 years for surveillance purposes.  If your anemia does not improve with omeprazole and iron supplementation as expected, we will consider performing small bowel capsule endoscopy.  I hope you have a great rest of your week!  Elon Alas. Abbey Chatters, D.O. Gastroenterology and Hepatology [facility]   General Anesthesia, Adult, Care After This sheet gives you information about how to care for yourself after your procedure. Your health care provider may also give you more specific instructions. If you have problems or questions, contact your health care provider. What can I expect after the procedure? After the procedure, the following side effects are common:  Pain or discomfort at the IV site.  Nausea.  Vomiting.  Sore throat.  Trouble concentrating.  Feeling cold or chills.  Weak or tired.  Sleepiness and fatigue.  Soreness and body aches. These side effects can affect parts of the body that were not involved in surgery. Follow these instructions at home:  For at least 24 hours after the procedure:  Have a responsible adult  stay with you. It is important to have someone help care for you until you are awake and alert.  Rest as needed.  Do not: ? Participate in activities in which you could fall or become injured. ? Drive. ? Use heavy machinery. ? Drink alcohol. ? Take sleeping pills or medicines that cause drowsiness. ? Make important decisions or sign legal documents. ? Take care of children on your own. Eating and drinking  Follow any instructions from your health care provider about eating or drinking restrictions.  When you feel hungry, start by eating small amounts of foods that are soft and easy to digest (bland), such as toast. Gradually return to your regular diet.  Drink enough fluid to keep your urine pale yellow.  If you vomit, rehydrate by drinking water, juice, or clear broth. General instructions  If you have sleep apnea, surgery and certain medicines can increase your risk for breathing problems. Follow instructions from your health care provider about wearing your sleep device: ?  Anytime you are sleeping, including during daytime naps. ? While taking prescription pain medicines, sleeping medicines, or medicines that make you drowsy.  Return to your normal activities as told by your health care provider. Ask your health care provider what activities are safe for you.  Take over-the-counter and prescription medicines only as told by your health care provider.  If you smoke, do not smoke without supervision.  Keep all follow-up visits as told by your health care provider. This is important. Contact a health care provider if:  You have nausea or vomiting that does not get better with medicine.  You cannot eat or drink without vomiting.  You have pain that does not get better with medicine.  You are unable to pass urine.  You develop a skin rash.  You have a fever.  You have redness around your IV site that gets worse. Get help right away if:  You have difficulty  breathing.  You have chest pain.  You have blood in your urine or stool, or you vomit blood. Summary  After the procedure, it is common to have a sore throat or nausea. It is also common to feel tired.  Have a responsible adult stay with you for the first 24 hours after general anesthesia. It is important to have someone help care for you until you are awake and alert.  When you feel hungry, start by eating small amounts of foods that are soft and easy to digest (bland), such as toast. Gradually return to your regular diet.  Drink enough fluid to keep your urine pale yellow.  Return to your normal activities as told by your health care provider. Ask your health care provider what activities are safe for you. This information is not intended to replace advice given to you by your health care provider. Make sure you discuss any questions you have with your health care provider. Document Revised: 08/24/2017 Document Reviewed: 04/06/2017 Elsevier Patient Education  [place].

## 2020-08-04 ENCOUNTER — Other Ambulatory Visit: Payer: Self-pay

## 2020-08-04 LAB — SURGICAL PATHOLOGY

## 2020-08-05 ENCOUNTER — Encounter (HOSPITAL_COMMUNITY): Payer: Self-pay | Admitting: Internal Medicine

## 2020-08-13 ENCOUNTER — Other Ambulatory Visit: Payer: Self-pay

## 2020-08-13 ENCOUNTER — Encounter (HOSPITAL_COMMUNITY): Payer: Self-pay

## 2020-08-13 ENCOUNTER — Emergency Department (HOSPITAL_COMMUNITY)
Admission: EM | Admit: 2020-08-13 | Discharge: 2020-08-13 | Disposition: A | Discharge status: Home / Self Care | Payer: 59 | Attending: Emergency Medicine | Admitting: Emergency Medicine

## 2020-08-13 DIAGNOSIS — E876 Hypokalemia: Secondary | ICD-10-CM | POA: Diagnosis not present

## 2020-08-13 DIAGNOSIS — I10 Essential (primary) hypertension: Secondary | ICD-10-CM | POA: Diagnosis not present

## 2020-08-13 DIAGNOSIS — R799 Abnormal finding of blood chemistry, unspecified: Secondary | ICD-10-CM | POA: Diagnosis present

## 2020-08-13 DIAGNOSIS — Z87891 Personal history of nicotine dependence: Secondary | ICD-10-CM | POA: Diagnosis not present

## 2020-08-13 DIAGNOSIS — Z79899 Other long term (current) drug therapy: Secondary | ICD-10-CM | POA: Diagnosis not present

## 2020-08-13 LAB — CBC
HCT: 41.7 % (ref 36.0–46.0)
Hemoglobin: 12.8 g/dL (ref 12.0–15.0)
MCH: 28.4 pg (ref 26.0–34.0)
MCHC: 30.7 g/dL (ref 30.0–36.0)
MCV: 92.7 fL (ref 80.0–100.0)
Platelets: 147 10*3/uL — ABNORMAL LOW (ref 150–400)
RBC: 4.5 MIL/uL (ref 3.87–5.11)
RDW: 15 % (ref 11.5–15.5)
WBC: 8.6 10*3/uL (ref 4.0–10.5)
nRBC: 0 % (ref 0.0–0.2)

## 2020-08-13 LAB — BASIC METABOLIC PANEL
Anion gap: 13 (ref 5–15)
BUN: 17 mg/dL (ref 6–20)
CO2: 24 mmol/L (ref 22–32)
Calcium: 9 mg/dL (ref 8.9–10.3)
Chloride: 98 mmol/L (ref 98–111)
Creatinine, Ser: 1.03 mg/dL — ABNORMAL HIGH (ref 0.44–1.00)
GFR, Estimated: >60 mL/min (ref >60)
Glucose, Bld: 94 mg/dL (ref 70–99)
Potassium: 2.9 mmol/L — ABNORMAL LOW (ref 3.5–5.1)
Sodium: 135 mmol/L (ref 135–145)

## 2020-08-13 LAB — MAGNESIUM: Magnesium: 2 mg/dL (ref 1.7–2.4)

## 2020-08-13 MED ORDER — POTASSIUM CHLORIDE CRYS ER 20 MEQ PO TBCR
40.0000 meq | EXTENDED_RELEASE_TABLET | Freq: Once | ORAL | Status: AC
  Administered 2020-08-13: 40 meq via ORAL
  Filled 2020-08-13: qty 2

## 2020-08-13 NOTE — ED Triage Notes (Signed)
Sent from MD office. Said K+ was 2.3 and to come here to be seen

## 2020-08-13 NOTE — ED Notes (Signed)
ED Provider at bedside. 

## 2020-08-13 NOTE — ED Provider Notes (Signed)
[facility] EMERGENCY DEPARTMENT Provider Note   CSN: [id number] Arrival date & time: 08/13/20  1659     History Chief Complaint  Patient presents with  . Abnormal Lab    Lauren Osborn is a 44 y.o. female with PMH of HTN and GERD on diuretic medications who presents to the ED sent from primary care provider for lab abnormality.  Patient reportedly had a potassium of 2.3 and was advised to come to the ER today.  She did not want to come, but her family insisted.  She states that she chronically has been battling low potassium as well as low iron.  She states that she has been worked up extensively, including upper and lower endoscopy with GI.  She states that she has an appointment with her primary care provider on Wednesday, 08/18/2020.  She states that she takes Lasix and spironolactone for lower extremity edema, but ran out of her Lasix few days ago.  She denies any fevers, chills, abdominal pain, nausea or vomiting, new or worsening swelling, chest pain or shortness of breath, urinary symptoms, changes in bowel habits, or other symptoms.  HPI     Past Medical History:  Diagnosis Date  . Essential hypertension   . Fibroids   . Gout   . Thyroid nodule     Patient Active Problem List   Diagnosis Date Noted  . Normocytic anemia 04/23/2020  . Family history of colon cancer 04/23/2020  . Mass of upper outer quadrant of right breast 11/04/2019  . S/P partial thyroidectomy 10/21/2018  . Visit for routine gyn exam 07/03/2018    Past Surgical History:  Procedure Laterality Date  . BIOPSY  08/03/2020   Procedure: BIOPSY;  Surgeon: Eloise Harman, DO;  [facility];  Service: Endoscopy;;  duodenal gastric  . CESAREAN SECTION    . COLONOSCOPY WITH PROPOFOL N/A 08/03/2020   Procedure: COLONOSCOPY WITH PROPOFOL;  Surgeon: Eloise Harman, DO;  [facility];  Service: Endoscopy;  Laterality: N/A;  1:15pm-rescheduled to 11/30 @ 8:00am  .  ESOPHAGOGASTRODUODENOSCOPY (EGD) WITH PROPOFOL N/A 08/03/2020   Procedure: ESOPHAGOGASTRODUODENOSCOPY (EGD) WITH PROPOFOL;  Surgeon: Eloise Harman, DO;  [facility];  Service: Endoscopy;  Laterality: N/A;  . HYSTERECTOMY ABDOMINAL WITH SALPINGECTOMY  2008  . POLYPECTOMY  08/03/2020   Procedure: POLYPECTOMY;  Surgeon: Eloise Harman, DO;  [facility];  Service: Endoscopy;;  colon  . THYROIDECTOMY Right 10/21/2018   Procedure: RIGHT HEMITHYROIDECTOMY;  Surgeon: Leta Baptist, MD;  Location: [place];  Service: ENT;  Laterality: Right;     OB History    Gravida  2   Para  1   Term  1   Preterm      AB  1   Living  1     SAB      IAB      Ectopic      Multiple      Live Births              Family History  Problem Relation Age of Onset  . Breast cancer Mother   . Other Paternal Grandfather        house fire  . Other Maternal Grandmother        house fire  . Congestive Heart Failure Maternal Grandfather   . Colon cancer Father 34  . Diverticulitis Brother   . Diverticulitis Sister   . Colon polyps Sister 64  .  Diabetes Daughter        borderline    Social History   Tobacco Use  . Smoking status: Former Smoker    Packs/day: 0.50    Years: 20.00    Pack years: 10.00    Types: Cigarettes    Quit date: 2017    Years since quitting: 4.9  . Smokeless tobacco: Never Used  Vaping Use  . Vaping Use: Never used  Substance Use Topics  . Alcohol use: Not Currently  . Drug use: Never    Home Medications Prior to Admission medications   Medication Sig Start Date End Date Taking? Authorizing Provider  acetaminophen (TYLENOL) 500 MG tablet Take 1,000 mg by mouth every 6 (six) hours as needed for moderate pain or headache.    [provider]  calcium carbonate (TUMS - DOSED IN MG ELEMENTAL CALCIUM) 500 MG chewable tablet Chew 2 tablets by mouth daily as needed for indigestion or heartburn.    [provider]  FEROSUL 325 (65 Fe) MG tablet Take 325 mg by mouth 2 (two) times daily. 02/14/20   [provider]  furosemide (LASIX) 40 MG tablet Take 40 mg by mouth daily.    [provider]  omeprazole (PRILOSEC) 20 MG capsule Take 1 capsule (20 mg total) by mouth 2 (two) times daily. 08/03/20 01/30/21  Eloise Harman, DO  potassium chloride (KLOR-CON) 10 MEQ tablet Take 10 mEq by mouth 2 (two) times daily.    [provider]  spironolactone (ALDACTONE) 25 MG tablet Take 25 mg by mouth daily.    [provider]    Allergies    Percocet [oxycodone-acetaminophen]  Review of Systems   Review of Systems  All other systems reviewed and are negative.   Physical Exam Updated Vital Signs BP 115/81   Pulse 94   Temp 98.7 F (37.1 C)   Resp 17   Wt 129.7 kg   SpO2 99%   BMI 50.66 kg/m   Physical Exam Vitals and nursing note reviewed. Exam conducted with a chaperone present.  Constitutional:      General: She is not in acute distress.    Appearance: Normal appearance. She is not ill-appearing.  HENT:     Head: Normocephalic and atraumatic.  Eyes:     General: No scleral icterus.    Conjunctiva/sclera: Conjunctivae normal.  Cardiovascular:     Rate and Rhythm: Normal rate.     Pulses: Normal pulses.  Pulmonary:     Effort: Pulmonary effort is normal. No respiratory distress.  Abdominal:     General: Abdomen is flat. There is no distension.     Palpations: Abdomen is soft.     Tenderness: There is no abdominal tenderness. There is no guarding.  Musculoskeletal:     Right lower leg: Edema present.     Left lower leg: Edema present.     Comments: Mild 1+ pitting edema bilaterally.  Skin:    General: Skin is dry.     Capillary Refill: Capillary refill takes less than 2 seconds.  Neurological:     Mental Status: She is alert and oriented to person, place, and time.     GCS: GCS eye subscore is 4. GCS verbal subscore is 5. GCS motor subscore is 6.   Psychiatric:        Mood and Affect: Mood normal.        Behavior: Behavior normal.        Thought Content: Thought content normal.  ED Results / Procedures / Treatments   Labs (all labs ordered are listed, but only abnormal results are displayed) Labs Reviewed  CBC - Abnormal; Notable for the following components:      Result Value   Platelets 147 (*)    All other components within normal limits  BASIC METABOLIC PANEL - Abnormal; Notable for the following components:   Potassium 2.9 (*)    Creatinine, Ser 1.03 (*)    All other components within normal limits  MAGNESIUM    EKG None  Radiology No results found.  Procedures Procedures (including critical care time)  Medications Ordered in ED Medications  potassium chloride SA (KLOR-CON) CR tablet 40 mEq (has no administration in time range)    ED Course  I have reviewed the triage vital signs and the nursing notes.  Pertinent labs & imaging results that were available during my care of the patient were reviewed by me and considered in my medical decision making (see chart for details).    MDM Rules/Calculators/A&P                          Patient presented to the ED as advised by her primary care provider for reported potassium of 2.3.  On her examination here in the ED, potassium is improved at 2.9.  Her magnesium is also within normal limits at 2.0.  She denies any recent nausea, emesis, or diarrhea.  Her low potassium is likely related to her diuretic.  She has been on Lasix until recently at which point she ran out.  She is following up with her primary care provider in just a few days where she can have a laboratory recheck.  I also encouraged her to consider triamterene as opposed to furosemide moving forward given that it is potassium sparing.  The remainder of laboratory work-up is unremarkable and she has no complaints at this time.  She has supplemental potassium at home which she will continue taking, as  directed.  ED return precautions discussed.  Patient voices understanding and is agreeable to the plan.   Final Clinical Impression(s) / ED Diagnoses Final diagnoses:  Hypokalemia    Rx / DC Orders ED Discharge Orders    None       Corena Herter, PA-C 08/13/20 2158    Sherwood Gambler, MD 08/13/20 2259

## 2020-08-13 NOTE — Discharge Instructions (Addendum)
While your potassium was low at 2.9, it is improved when compared to reported low potassium of 2.3 in labs 2 days ago.  In the absence of any nausea, emesis, or diarrhea, suspect that is related to your furosemide diuretic.  I encourage you to consider triamterene as an alternative to furosemide moving forward.  Please continue to go to your appointment on Wednesday with your primary care provider for laboratory recheck and to discuss this further.  I encourage you to consider compression stockings for your lower extremity edema.  You have been given 40 mEq of K-Dur potassium here in the ED, continue with your at home supplemental potassium.  Return to the ED or seek immediate medical attention should you experience any new or worsening symptoms.

## 2020-08-16 ENCOUNTER — Telehealth: Payer: Self-pay

## 2020-08-16 NOTE — Telephone Encounter (Signed)
Transition Care Management Follow-up Telephone Call  Date of discharge and from where: 08/13/2020 [facility]  How have you been since you were released from the hospital? Just feeling tired.   Any questions or concerns? No  Items Reviewed:  Did the pt receive and understand the discharge instructions provided? Yes   Medications obtained and verified? No   Other? No   Any new allergies since your discharge? No   Dietary orders reviewed? Yes  Do you have support at home? Yes    Functional Questionnaire: (I = Independent and D = Dependent) ADLs: I  Bathing/Dressing- I  Meal Prep- I  Eating- I  Maintaining continence- I  Transferring/Ambulation- I  Managing Meds- I  Follow up appointments reviewed:   PCP Hospital f/u appt confirmed? Yes  Scheduled to see [facility] on 12/15/202.  [facility] f/u appt confirmed? No    Are transportation arrangements needed? No   If their condition worsens, is the pt aware to call PCP or go to the Emergency Dept.? Yes  Was the patient provided with contact information for the PCP's office or ED? Yes  Was to pt encouraged to call back with questions or concerns? Yes

## 2020-08-25 ENCOUNTER — Other Ambulatory Visit (HOSPITAL_COMMUNITY): Payer: Self-pay | Admitting: Family Medicine

## 2020-08-25 DIAGNOSIS — Z1231 Encounter for screening mammogram for malignant neoplasm of breast: Secondary | ICD-10-CM

## 2020-09-01 ENCOUNTER — Other Ambulatory Visit: Payer: Self-pay | Admitting: Internal Medicine

## 2020-09-01 ENCOUNTER — Other Ambulatory Visit (HOSPITAL_COMMUNITY): Payer: Self-pay | Admitting: Internal Medicine

## 2020-09-01 DIAGNOSIS — R7989 Other specified abnormal findings of blood chemistry: Secondary | ICD-10-CM

## 2020-09-08 ENCOUNTER — Other Ambulatory Visit: Payer: Self-pay

## 2020-09-08 ENCOUNTER — Ambulatory Visit (HOSPITAL_COMMUNITY)
Admission: RE | Admit: 2020-09-08 | Discharge: 2020-09-08 | Disposition: A | Discharge status: Home / Self Care | Payer: 59 | Source: Ambulatory Visit | Attending: Internal Medicine | Admitting: Internal Medicine

## 2020-09-08 DIAGNOSIS — R945 Abnormal results of liver function studies: Secondary | ICD-10-CM | POA: Diagnosis not present

## 2020-09-08 DIAGNOSIS — R7989 Other specified abnormal findings of blood chemistry: Secondary | ICD-10-CM

## 2020-09-08 IMAGING — US US ABDOMEN LIMITED
1 series · 14 of 25 positions shown · non-contrast
Comparison: None

CLINICAL DATA: Abnormal LFTs for 6 months, history hypertension

EXAM:
ULTRASOUND ABDOMEN LIMITED RIGHT UPPER QUADRANT

[Series 1: us abdomen limited ruq (liver/gb) · 14 of 53 slices shown]
[im 1/53]
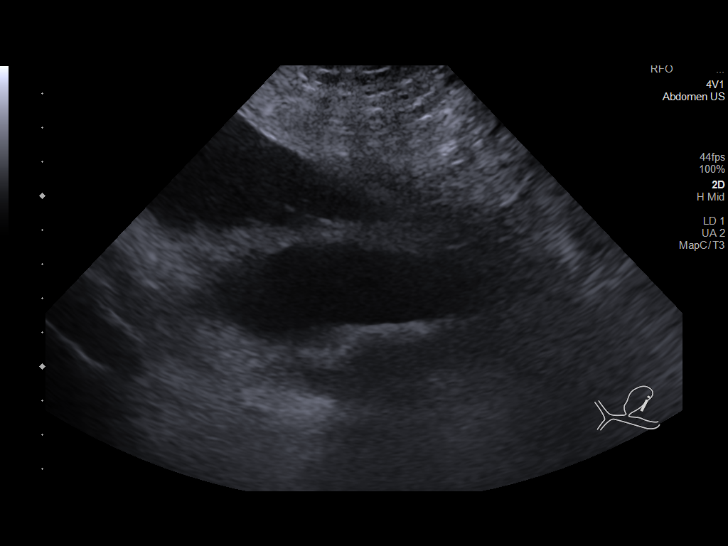
[im 5/53]
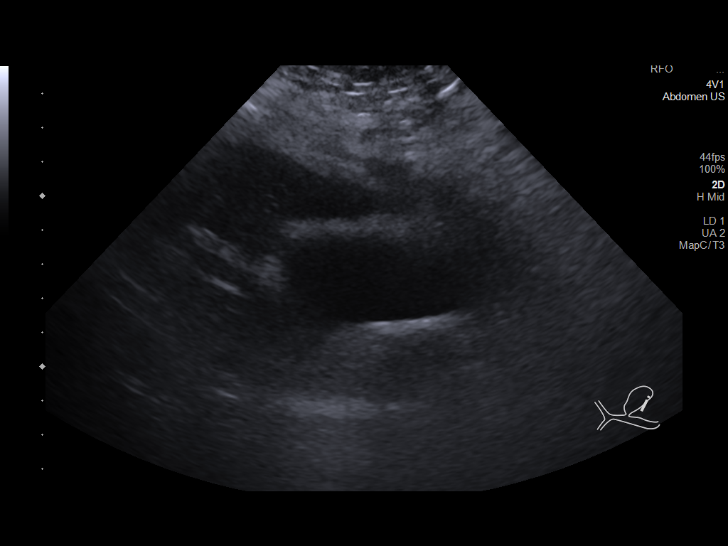
[im 9/53]
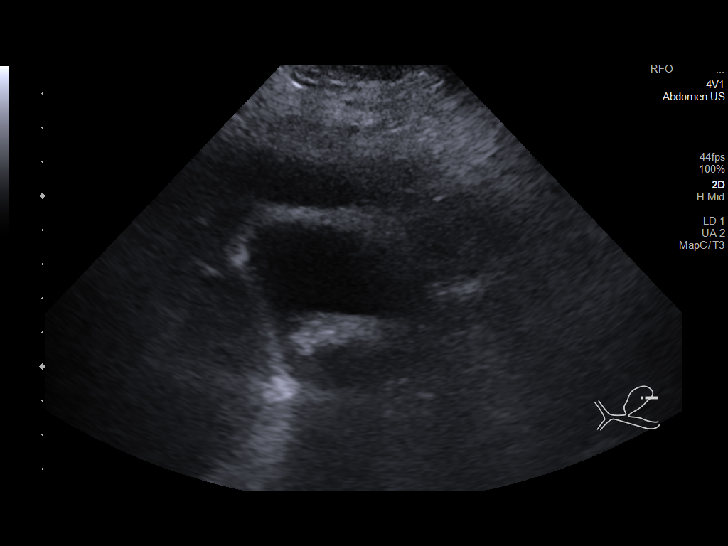
[im 14/53]
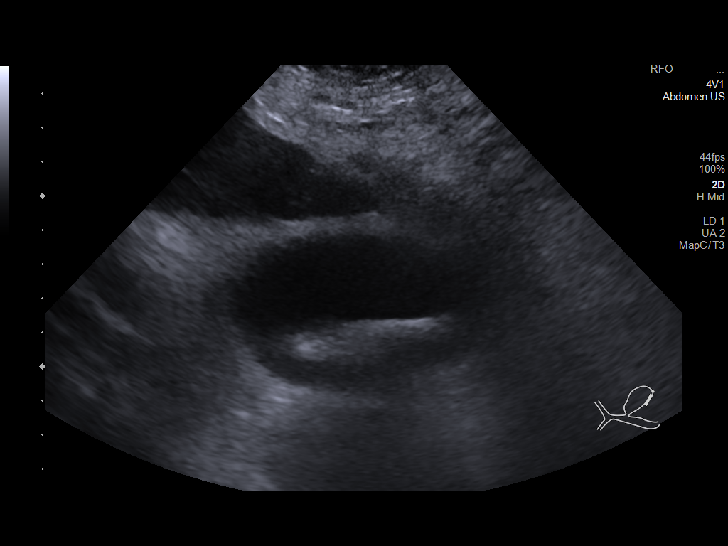
[im 18/53]
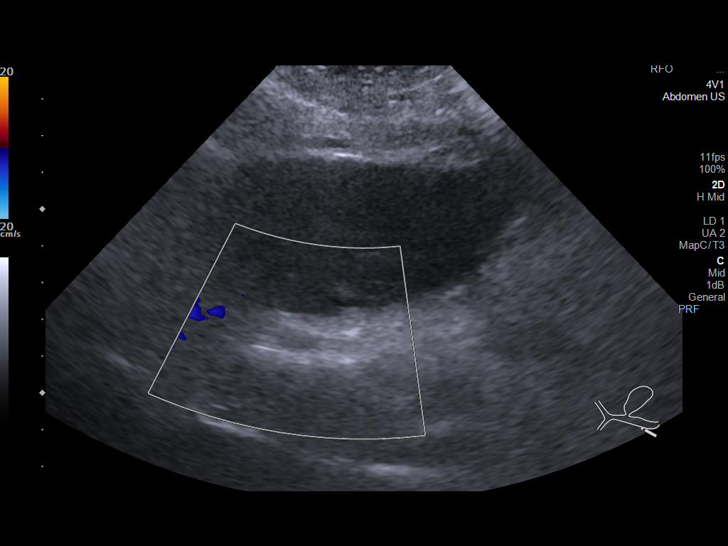
[im 20/53]
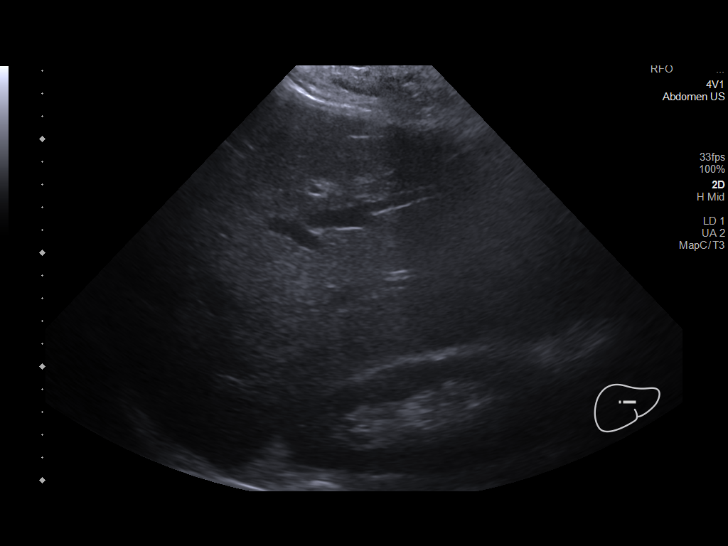
[im 24/53]
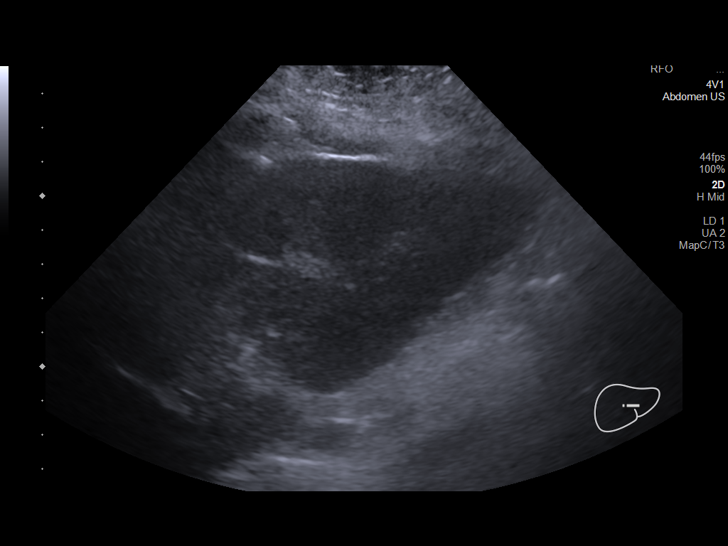
[im 29/53]
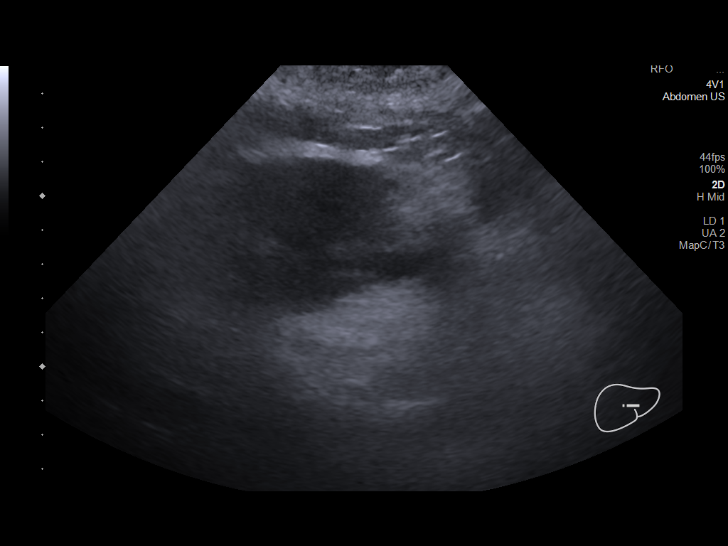
[im 33/53]
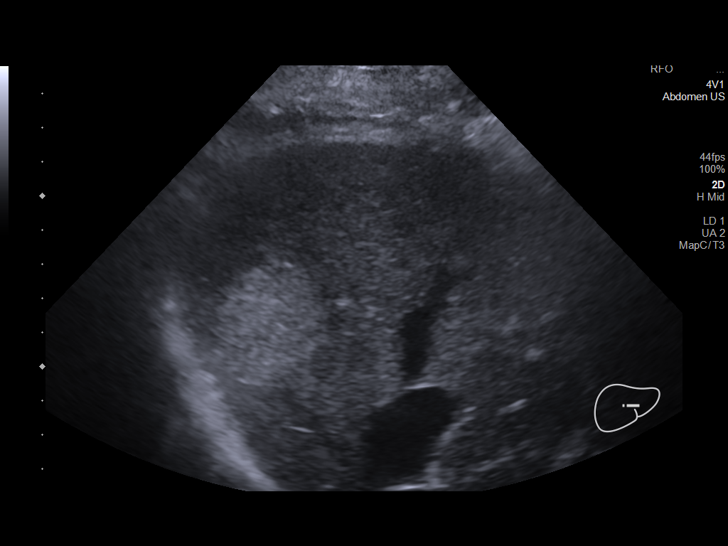
[im 35/53]
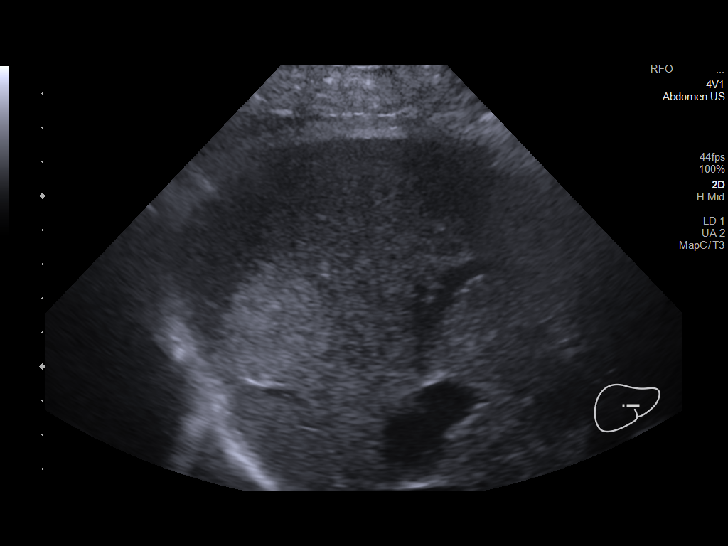
[im 40/53]
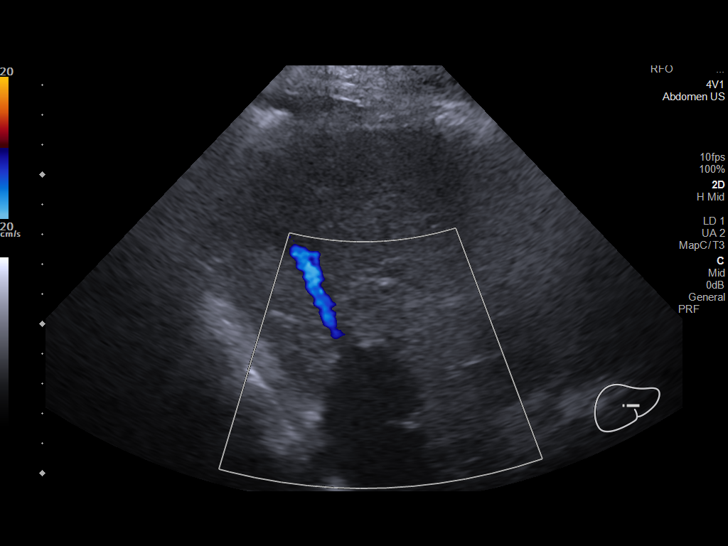
[im 44/53]
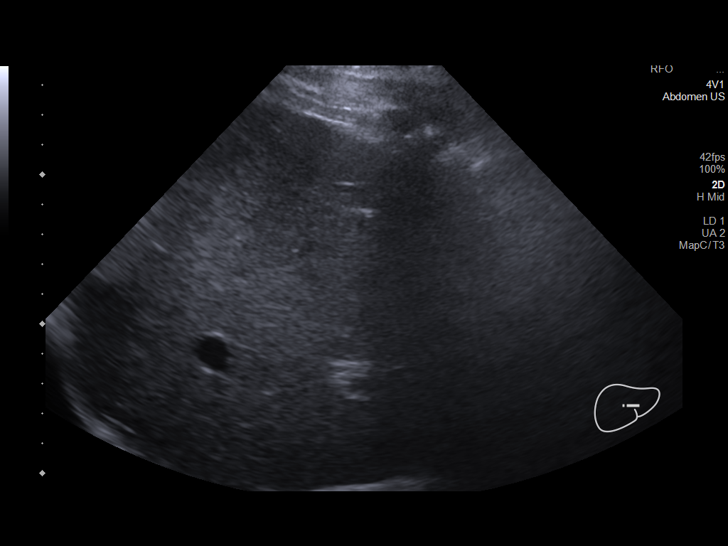
[im 48/53]
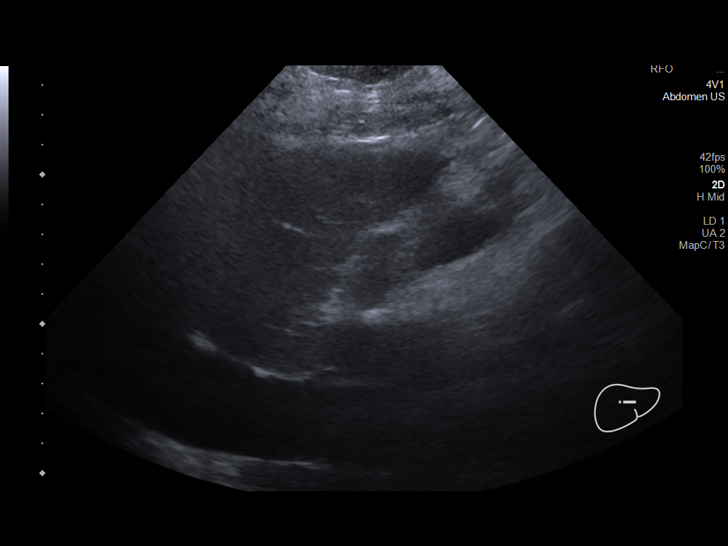
[im 53/53]
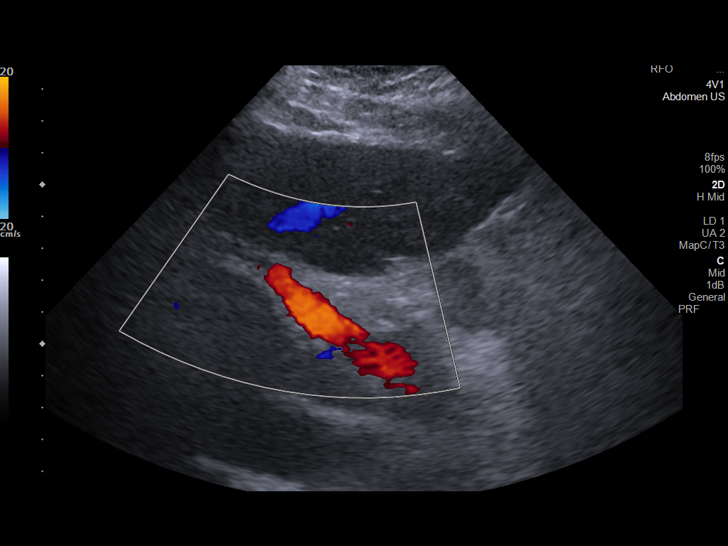

[14 of 25 positions shown; findings below may reference images not displayed]

FINDINGS: Gallbladder:

Suboptimally assessed due to body habitus. Large shadowing echogenic
focus seen within gallbladder 5.3 cm length likely a large gallstone
versus less likely multiple tiny calculi. Mildly thickened
gallbladder wall. No pericholecystic fluid. No sonographic Murphy
sign.

Common bile duct:

Diameter: 4 mm

Liver:

Normal parenchymal echogenicity. Hyperechoic mass RIGHT lobe 3.4 x
3.8 x 2.7 cm. No additional masses identified. Portal vein is patent
on color Doppler imaging with normal direction of blood flow towards
the liver.

Other: No RIGHT upper quadrant free fluid.
IMPRESSION: Cholelithiasis with probable large single calculus 5.3 cm diameter
versus less likely multiple tiny dependent calculi within
gallbladder.

No biliary dilatation.

Hyperechoic mass RIGHT lobe liver 3.8 cm in size; finding is
nonspecific, could potentially represent a hepatic hemangioma but is
not diagnostic and other etiologies are not excluded; further
characterization by MR imaging with and without contrast
recommended.

## 2020-09-09 ENCOUNTER — Other Ambulatory Visit: Payer: Self-pay

## 2020-09-09 ENCOUNTER — Telehealth: Payer: Self-pay

## 2020-09-09 ENCOUNTER — Encounter: Payer: Self-pay | Admitting: Cardiology

## 2020-09-09 ENCOUNTER — Ambulatory Visit (INDEPENDENT_AMBULATORY_CARE_PROVIDER_SITE_OTHER): Payer: Managed Care, Other (non HMO) | Admitting: Cardiology

## 2020-09-09 ENCOUNTER — Other Ambulatory Visit (HOSPITAL_COMMUNITY)
Admission: RE | Admit: 2020-09-09 | Discharge: 2020-09-09 | Disposition: A | Discharge status: Home / Self Care | Payer: Managed Care, Other (non HMO) | Source: Ambulatory Visit | Attending: Cardiology | Admitting: Cardiology

## 2020-09-09 VITALS — BP 140/86 | HR 100 | Ht 63.0 in | Wt 351.0 lb

## 2020-09-09 DIAGNOSIS — R601 Generalized edema: Secondary | ICD-10-CM | POA: Diagnosis not present

## 2020-09-09 DIAGNOSIS — I10 Essential (primary) hypertension: Secondary | ICD-10-CM | POA: Diagnosis not present

## 2020-09-09 DIAGNOSIS — R0609 Other forms of dyspnea: Secondary | ICD-10-CM

## 2020-09-09 DIAGNOSIS — I890 Lymphedema, not elsewhere classified: Secondary | ICD-10-CM

## 2020-09-09 DIAGNOSIS — Z79899 Other long term (current) drug therapy: Secondary | ICD-10-CM

## 2020-09-09 DIAGNOSIS — R011 Cardiac murmur, unspecified: Secondary | ICD-10-CM

## 2020-09-09 DIAGNOSIS — R0600 Dyspnea, unspecified: Secondary | ICD-10-CM

## 2020-09-09 LAB — BASIC METABOLIC PANEL
Anion gap: 11 (ref 5–15)
BUN: 22 mg/dL — ABNORMAL HIGH (ref 6–20)
CO2: 23 mmol/L (ref 22–32)
Calcium: 9.3 mg/dL (ref 8.9–10.3)
Chloride: 104 mmol/L (ref 98–111)
Creatinine, Ser: 1.44 mg/dL — ABNORMAL HIGH (ref 0.44–1.00)
GFR, Estimated: 45 mL/min — ABNORMAL LOW (ref >60)
Glucose, Bld: 95 mg/dL (ref 70–99)
Potassium: 3.1 mmol/L — ABNORMAL LOW (ref 3.5–5.1)
Sodium: 138 mmol/L (ref 135–145)

## 2020-09-09 MED ORDER — POTASSIUM CHLORIDE ER 10 MEQ PO TBCR: 20.0000 meq | EXTENDED_RELEASE_TABLET | Freq: Every day | ORAL | 3 refills | Status: DC

## 2020-09-09 MED ORDER — FUROSEMIDE 80 MG PO TABS: 80.0000 mg | ORAL_TABLET | Freq: Every day | ORAL | 3 refills | Status: DC

## 2020-09-09 NOTE — Telephone Encounter (Signed)
-----   Message from Satira Sark, MD sent at 09/09/2020  4:34 PM EST ----- Results reviewed.  Potassium 3.1 and creatinine 1.44.  Let's increase Lasix to 80 mg daily, increase KCl to 20 mEq daily.  BMET for her pending visit in 2 weeks.  If she does not begin diuresing and losing weight on current regimen, may need to be hospitalized for IV diuretic.

## 2020-09-09 NOTE — Progress Notes (Signed)
Cardiology Office Note  Date: 09/09/2020   ID: Lauren Osborn, DOB 10/27/1975, MRN [id number]  PCP:  [name], MD  Cardiologist:  Rozann Lesches, MD Electrophysiologist:  None   Chief Complaint  Patient presents with  . Cardiac follow-up    History of Present Illness: Lauren Osborn is a 45 y.o. female last seen in June 2021.  She presents for a follow-up visit.  She recently established follow-up with Dr. Legrand Rams, was not pleased with her previous PCP.  She has gained a substantial amount of weight over the last 6 months, reports fluid retention, increased abdominal girth, also has worsening leg swelling and lymphedema.  She is short of breath with activity.  Echocardiogram from July of last year revealed mild LVH with LVEF 70 to 75%, normal RV contraction, no significant valvular abnormalities.  She states that she has been consistently taking Lasix 40 mg daily except for a 2-week period when she ran out.  She is also on Aldactone.  She had prior lab work in December 2021 at which point she was significantly hypokalemic with potassium of 2.9 and her creatinine was normal at 1.03.  Follow-up lab work with Dr. Legrand Rams on December 28 is outlined below.  Past Medical History:  Diagnosis Date  . Essential hypertension   . Fibroids   . Gout   . Thyroid nodule     Past Surgical History:  Procedure Laterality Date  . BIOPSY  08/03/2020   Procedure: BIOPSY;  Surgeon: Eloise Harman, DO;  [facility];  Service: Endoscopy;;  duodenal gastric  . CESAREAN SECTION    . COLONOSCOPY WITH PROPOFOL N/A 08/03/2020   Procedure: COLONOSCOPY WITH PROPOFOL;  Surgeon: Eloise Harman, DO;  [facility];  Service: Endoscopy;  Laterality: N/A;  1:15pm-rescheduled to 11/30 @ 8:00am  . ESOPHAGOGASTRODUODENOSCOPY (EGD) WITH PROPOFOL N/A 08/03/2020   Procedure: ESOPHAGOGASTRODUODENOSCOPY (EGD) WITH PROPOFOL;  Surgeon: Eloise Harman, DO;  [facility];   Service: Endoscopy;  Laterality: N/A;  . HYSTERECTOMY ABDOMINAL WITH SALPINGECTOMY  2008  . POLYPECTOMY  08/03/2020   Procedure: POLYPECTOMY;  Surgeon: Eloise Harman, DO;  [facility];  Service: Endoscopy;;  colon  . THYROIDECTOMY Right 10/21/2018   Procedure: RIGHT HEMITHYROIDECTOMY;  Surgeon: Leta Baptist, MD;  Location: [place];  Service: ENT;  Laterality: Right;    Current Outpatient Medications  Medication Sig Dispense Refill  . acetaminophen (TYLENOL) 500 MG tablet Take 1,000 mg by mouth every 6 (six) hours as needed for moderate pain or headache.    . FEROSUL 325 (65 Fe) MG tablet Take 325 mg by mouth 2 (two) times daily.    . furosemide (LASIX) 40 MG tablet Take 40 mg by mouth daily.    Marland Kitchen omeprazole (PRILOSEC) 20 MG capsule Take 1 capsule (20 mg total) by mouth 2 (two) times daily. 60 capsule 5  . potassium chloride (KLOR-CON) 10 MEQ tablet Take 10 mEq by mouth 2 (two) times daily.    Marland Kitchen spironolactone (ALDACTONE) 25 MG tablet Take 25 mg by mouth daily.     No current facility-administered medications for this visit.   Allergies:  Oxycodone-acetaminophen   ROS: No palpitations or syncope.  Physical Exam: VS:  BP 140/86   Pulse 100   Ht 5\' 3"  (1.6 m)   Wt (!) 351 lb (159.2 kg)   SpO2 97%   BMI 62.18 kg/m , BMI Body mass index is 62.18 kg/m.  Wt  Readings from Last 3 Encounters:  09/09/20 (!) 351 lb (159.2 kg)  08/13/20 286 lb (129.7 kg)  08/02/20 280 lb (127 kg)    General: Patient appears comfortable at rest. HEENT: Conjunctiva and lids normal, wearing a mask. Neck: Supple, difficult to assess JVP. Lungs: Clear to auscultation, nonlabored breathing at rest. Cardiac: Regular rate and rhythm, no S3 or significant systolic murmur. Abdomen: Obese, protuberant, bowel sounds present. Extremities: Significant bilateral lymphedema.  ECG:  An ECG dated 02/06/2020 was personally reviewed today and demonstrated:  Sinus rhythm with nondiagnostic  inferior Q waves.  Recent Labwork: 08/13/2020: BUN 17; Creatinine, Ser 1.03; Hemoglobin 12.8; Magnesium 2.0; Platelets 147; Potassium 2.9; Sodium 135  08/31/2020: Cholesterol 111, HDL 30, triglycerides 67, LDL 67, BUN 16, creatinine 1.1, potassium 3.6, hemoglobin A1c 5.4%, AST 19, ALT 10, TSH 3.76, hemoglobin 12.3, platelets 220  Other Studies Reviewed Today:  Echocardiogram 03/18/2020: 1. Left ventricular ejection fraction, by estimation, is 70 to 75%. The  left ventricle has hyperdynamic function. The left ventricle has no  regional wall motion abnormalities. There is mild left ventricular  hypertrophy. Left ventricular diastolic  parameters were normal.  2. Right ventricular systolic function is normal. The right ventricular  size is normal. There is normal pulmonary artery systolic pressure.  3. Left atrial size was mildly dilated.  4. The mitral valve is normal in structure. Trivial mitral valve  regurgitation.  5. The aortic valve is normal in structure. Aortic valve regurgitation is  not visualized.  6. The inferior vena cava is dilated in size with >50% respiratory  variability, suggesting right atrial pressure of 8 mmHg.   Assessment and Plan:  1. Significant fluid overload and weight gain over the last 6 months associated with worsening lymphedema.  Echocardiogram from July of last year revealed vigorous LVEF at 70 to 75% and also normal RV contraction.  I reviewed her recent lab work from Dr. Legrand Rams, TSH normal, creatinine 1.1, and potassium up to 3.6.  Plan to repeat BMET today and advance diuretics with potassium supplementation.  We will arrange office follow-up within the next few weeks.  Hopefully can achieve an effective diuresis by titrating outpatient oral diuretics, if not she may require hospitalization.  I think eventually mechanical compression for management of lymphedema will need to be pursued as well.  2. Essential hypertension by history.  At this point she  is on Aldactone, prior intolerances to lisinopril and telmisartan.  Medication Adjustments/Labs and Tests Ordered: Current medicines are reviewed at length with the patient today.  Concerns regarding medicines are outlined above.   Tests Ordered: Orders Placed This Encounter  Procedures  . Basic Metabolic Panel (BMET)    Medication Changes: No orders of the defined types were placed in this encounter.   Disposition:  Follow up 2 weeks for clinical reevaluation.  Signed, Satira Sark, MD, [facility] 09/09/2020 3:14 PM    [facility] [address], [place], [place] [address] Phone: [phone]; Fax: [phone]

## 2020-09-09 NOTE — Telephone Encounter (Signed)
Called patient and verbalized her results per Dr. Domenic Polite. She understood. Increased her Lasix to 80 mg, her KCL to 20 mEq, and put in a order for a BMET in 2 weeks.

## 2020-09-09 NOTE — Patient Instructions (Signed)
Medication Instructions:  To be determined *If you need a refill on your cardiac medications before your next appointment, please call your pharmacy*   Lab Work: BMET Today Requested labs from PCP If you have labs (blood work) drawn today and your tests are completely normal, you will receive your results only by: Marland Kitchen MyChart Message (if you have MyChart) OR . A paper copy in the mail If you have any lab test that is abnormal or we need to change your treatment, we will call you to review the results.   Testing/Procedures: None Today   Follow-Up: At [facility], you and your health needs are our priority.  As part of our continuing mission to provide you with exceptional heart care, we have created designated Provider Care Teams.  These Care Teams include your primary Cardiologist (physician) and Advanced Practice Providers (APPs -  Physician Assistants and Nurse Practitioners) who all work together to provide you with the care you need, when you need it.  We recommend signing up for the patient portal called "MyChart".  Sign up information is provided on this After Visit Summary.  MyChart is used to connect with patients for Virtual Visits (Telemedicine).  Patients are able to view lab/test results, encounter notes, upcoming appointments, etc.  Non-urgent messages can be sent to your provider as well.   To learn more about what you can do with MyChart, go to [email or web address].    Your next appointment:   2 week(s)  The format for your next appointment:   In Person  Provider:   You will see one of the following Advanced Practice Providers on your designated Care Team:    Bernerd Pho, PA-C    Other Instructions None Today

## 2020-09-10 ENCOUNTER — Telehealth: Payer: Self-pay

## 2020-09-10 NOTE — Telephone Encounter (Signed)
Error

## 2020-09-10 NOTE — Telephone Encounter (Signed)
Contacted patient. She verbalized understanding of results, medication changes, and lab work instructions.

## 2020-09-10 NOTE — Telephone Encounter (Signed)
-----   Message from Satira Sark, MD sent at 09/09/2020  4:34 PM EST ----- Results reviewed.  Potassium 3.1 and creatinine 1.44.  Let's increase Lasix to 80 mg daily, increase KCl to 20 mEq daily.  BMET for her pending visit in 2 weeks.  If she does not begin diuresing and losing weight on current regimen, may need to be hospitalized for IV diuretic.

## 2020-09-15 ENCOUNTER — Inpatient Hospital Stay (HOSPITAL_COMMUNITY)
Admission: EM | Admit: 2020-09-15 | Discharge: 2020-09-22 | DRG: 291 | Disposition: A | Discharge status: Home / Self Care | Payer: Managed Care, Other (non HMO) | Attending: Family Medicine | Admitting: Family Medicine

## 2020-09-15 ENCOUNTER — Emergency Department (HOSPITAL_COMMUNITY): Payer: Managed Care, Other (non HMO)

## 2020-09-15 ENCOUNTER — Telehealth: Payer: Self-pay | Admitting: Cardiology

## 2020-09-15 ENCOUNTER — Encounter (HOSPITAL_COMMUNITY): Payer: Self-pay

## 2020-09-15 ENCOUNTER — Other Ambulatory Visit: Payer: Self-pay

## 2020-09-15 DIAGNOSIS — R9431 Abnormal electrocardiogram [ECG] [EKG]: Secondary | ICD-10-CM | POA: Diagnosis not present

## 2020-09-15 DIAGNOSIS — J811 Chronic pulmonary edema: Secondary | ICD-10-CM | POA: Diagnosis not present

## 2020-09-15 DIAGNOSIS — I5033 Acute on chronic diastolic (congestive) heart failure: Secondary | ICD-10-CM | POA: Diagnosis present

## 2020-09-15 DIAGNOSIS — I130 Hypertensive heart and chronic kidney disease with heart failure and stage 1 through stage 4 chronic kidney disease, or unspecified chronic kidney disease: Principal | ICD-10-CM | POA: Diagnosis present

## 2020-09-15 DIAGNOSIS — R601 Generalized edema: Secondary | ICD-10-CM | POA: Diagnosis not present

## 2020-09-15 DIAGNOSIS — N183 Chronic kidney disease, stage 3 unspecified: Secondary | ICD-10-CM | POA: Diagnosis present

## 2020-09-15 DIAGNOSIS — K219 Gastro-esophageal reflux disease without esophagitis: Secondary | ICD-10-CM | POA: Diagnosis not present

## 2020-09-15 DIAGNOSIS — R062 Wheezing: Secondary | ICD-10-CM | POA: Diagnosis not present

## 2020-09-15 DIAGNOSIS — R0600 Dyspnea, unspecified: Secondary | ICD-10-CM

## 2020-09-15 DIAGNOSIS — Z79899 Other long term (current) drug therapy: Secondary | ICD-10-CM

## 2020-09-15 DIAGNOSIS — R0689 Other abnormalities of breathing: Secondary | ICD-10-CM

## 2020-09-15 DIAGNOSIS — Z20822 Contact with and (suspected) exposure to covid-19: Secondary | ICD-10-CM | POA: Diagnosis present

## 2020-09-15 DIAGNOSIS — J810 Acute pulmonary edema: Secondary | ICD-10-CM

## 2020-09-15 DIAGNOSIS — R7989 Other specified abnormal findings of blood chemistry: Secondary | ICD-10-CM | POA: Diagnosis not present

## 2020-09-15 DIAGNOSIS — E669 Obesity, unspecified: Secondary | ICD-10-CM | POA: Diagnosis not present

## 2020-09-15 DIAGNOSIS — Z833 Family history of diabetes mellitus: Secondary | ICD-10-CM

## 2020-09-15 DIAGNOSIS — E876 Hypokalemia: Secondary | ICD-10-CM | POA: Diagnosis present

## 2020-09-15 DIAGNOSIS — R0602 Shortness of breath: Secondary | ICD-10-CM | POA: Diagnosis not present

## 2020-09-15 DIAGNOSIS — Z6841 Body Mass Index (BMI) 40.0 and over, adult: Secondary | ICD-10-CM

## 2020-09-15 DIAGNOSIS — N179 Acute kidney failure, unspecified: Secondary | ICD-10-CM | POA: Diagnosis not present

## 2020-09-15 DIAGNOSIS — Z87891 Personal history of nicotine dependence: Secondary | ICD-10-CM

## 2020-09-15 DIAGNOSIS — J90 Pleural effusion, not elsewhere classified: Secondary | ICD-10-CM | POA: Diagnosis not present

## 2020-09-15 DIAGNOSIS — Z8249 Family history of ischemic heart disease and other diseases of the circulatory system: Secondary | ICD-10-CM

## 2020-09-15 LAB — HEPATIC FUNCTION PANEL
ALT: 16 U/L (ref 0–44)
AST: 26 U/L (ref 15–41)
Albumin: 4.2 g/dL (ref 3.5–5.0)
Alkaline Phosphatase: 51 U/L (ref 38–126)
Bilirubin, Direct: 0.5 mg/dL — ABNORMAL HIGH (ref 0.0–0.2)
Indirect Bilirubin: 1.9 mg/dL — ABNORMAL HIGH (ref 0.3–0.9)
Total Bilirubin: 2.4 mg/dL — ABNORMAL HIGH (ref 0.3–1.2)
Total Protein: 7 g/dL (ref 6.5–8.1)

## 2020-09-15 LAB — BASIC METABOLIC PANEL
Anion gap: 11 (ref 5–15)
BUN: 20 mg/dL (ref 6–20)
CO2: 25 mmol/L (ref 22–32)
Calcium: 9.1 mg/dL (ref 8.9–10.3)
Chloride: 103 mmol/L (ref 98–111)
Creatinine, Ser: 1.48 mg/dL — ABNORMAL HIGH (ref 0.44–1.00)
GFR, Estimated: 44 mL/min — ABNORMAL LOW (ref >60)
Glucose, Bld: 104 mg/dL — ABNORMAL HIGH (ref 70–99)
Potassium: 3.4 mmol/L — ABNORMAL LOW (ref 3.5–5.1)
Sodium: 139 mmol/L (ref 135–145)

## 2020-09-15 LAB — CBC
HCT: 40.8 % (ref 36.0–46.0)
Hemoglobin: 12.7 g/dL (ref 12.0–15.0)
MCH: 29.6 pg (ref 26.0–34.0)
MCHC: 31.1 g/dL (ref 30.0–36.0)
MCV: 95.1 fL (ref 80.0–100.0)
Platelets: 248 10*3/uL (ref 150–400)
RBC: 4.29 MIL/uL (ref 3.87–5.11)
RDW: 17.1 % — ABNORMAL HIGH (ref 11.5–15.5)
WBC: 8.9 10*3/uL (ref 4.0–10.5)
nRBC: 0 % (ref 0.0–0.2)

## 2020-09-15 LAB — RESP PANEL BY RT-PCR (FLU A&B, COVID) ARPGX2
Influenza A by PCR: NEGATIVE
Influenza B by PCR: NEGATIVE
SARS Coronavirus 2 by RT PCR: NEGATIVE

## 2020-09-15 LAB — BRAIN NATRIURETIC PEPTIDE: B Natriuretic Peptide: 318 pg/mL — ABNORMAL HIGH (ref 0.0–100.0)

## 2020-09-15 IMAGING — DX DG CHEST 2V
2 series · 2 of 2 positions shown · non-contrast
Comparison: Single-view of the chest [DATE].

CLINICAL DATA: Increasing shortness of breath and wheezing for 1
month.

EXAM:
CHEST - 2 VIEW

[chest pa]
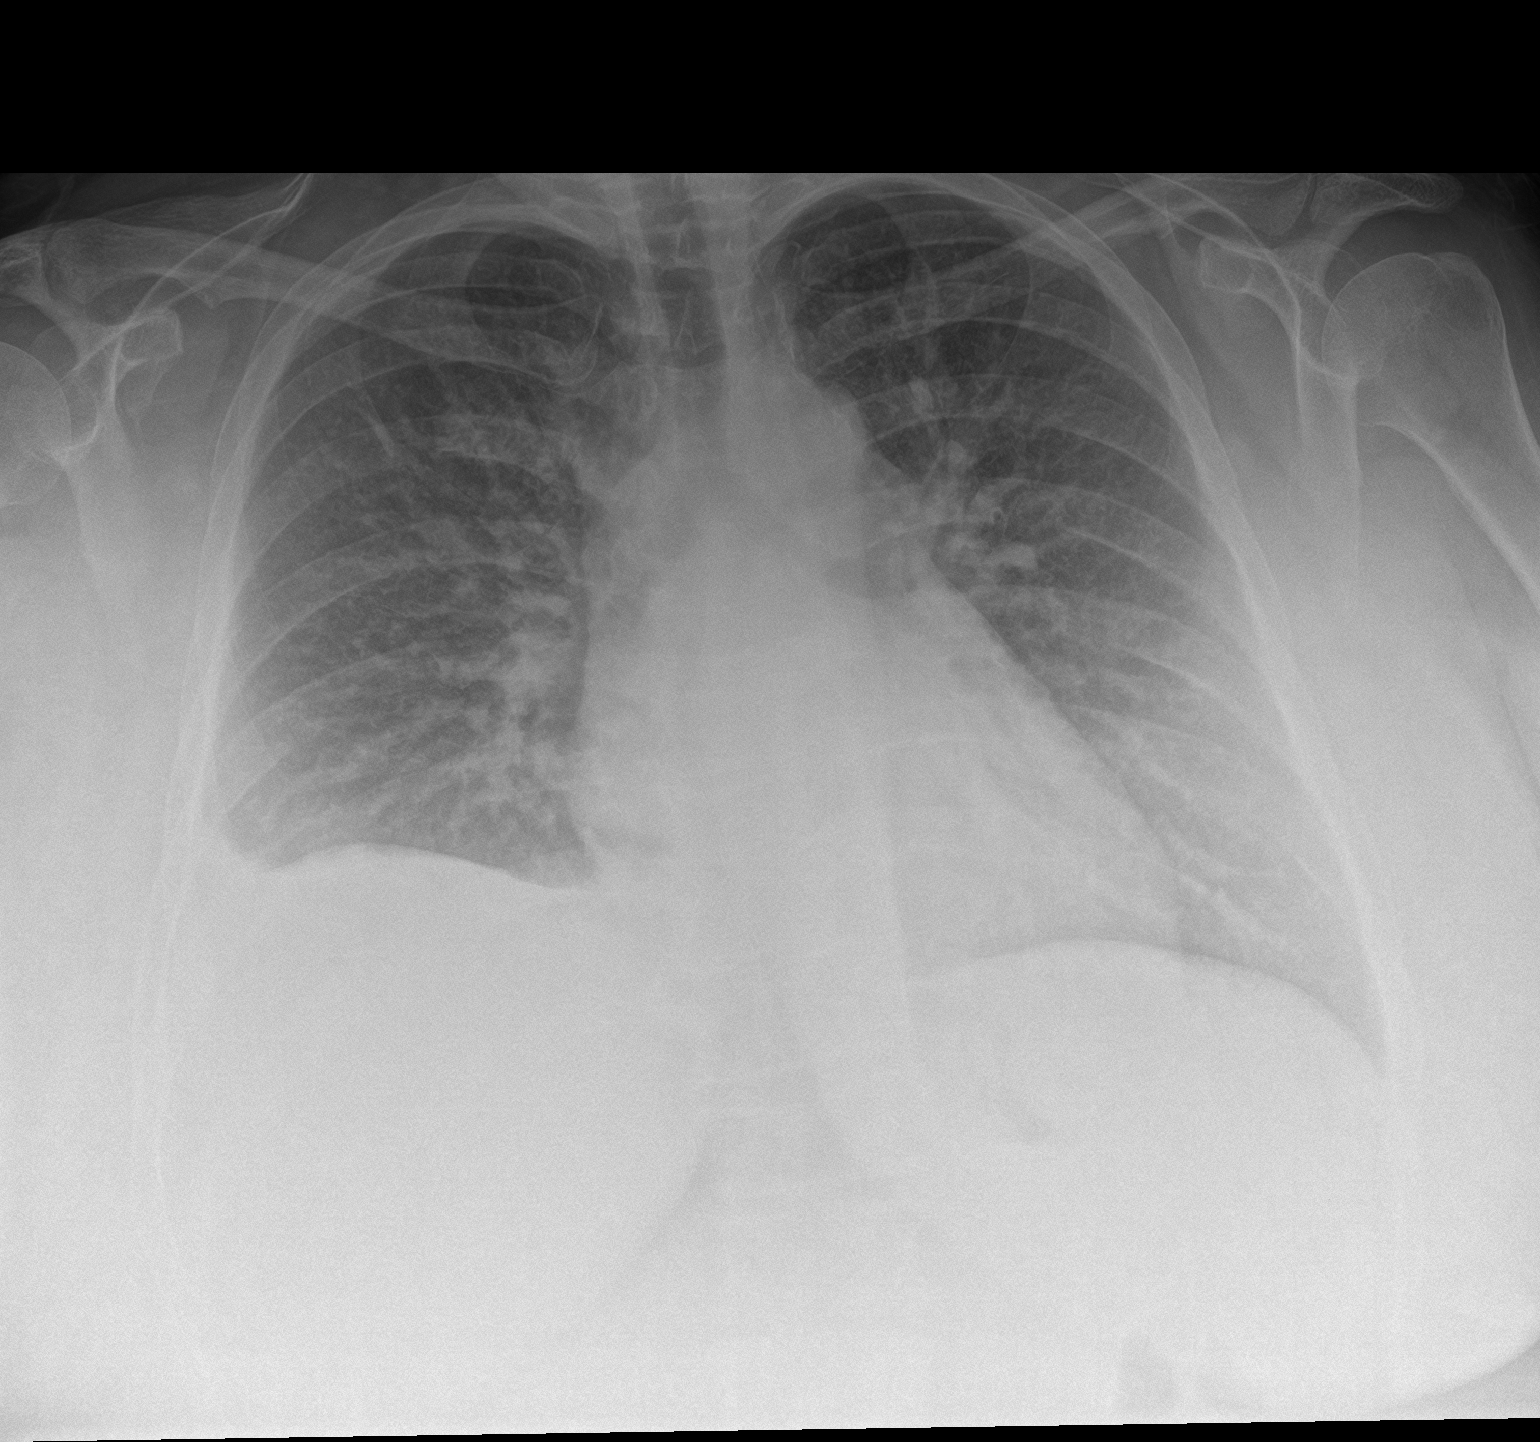

[chest lat]
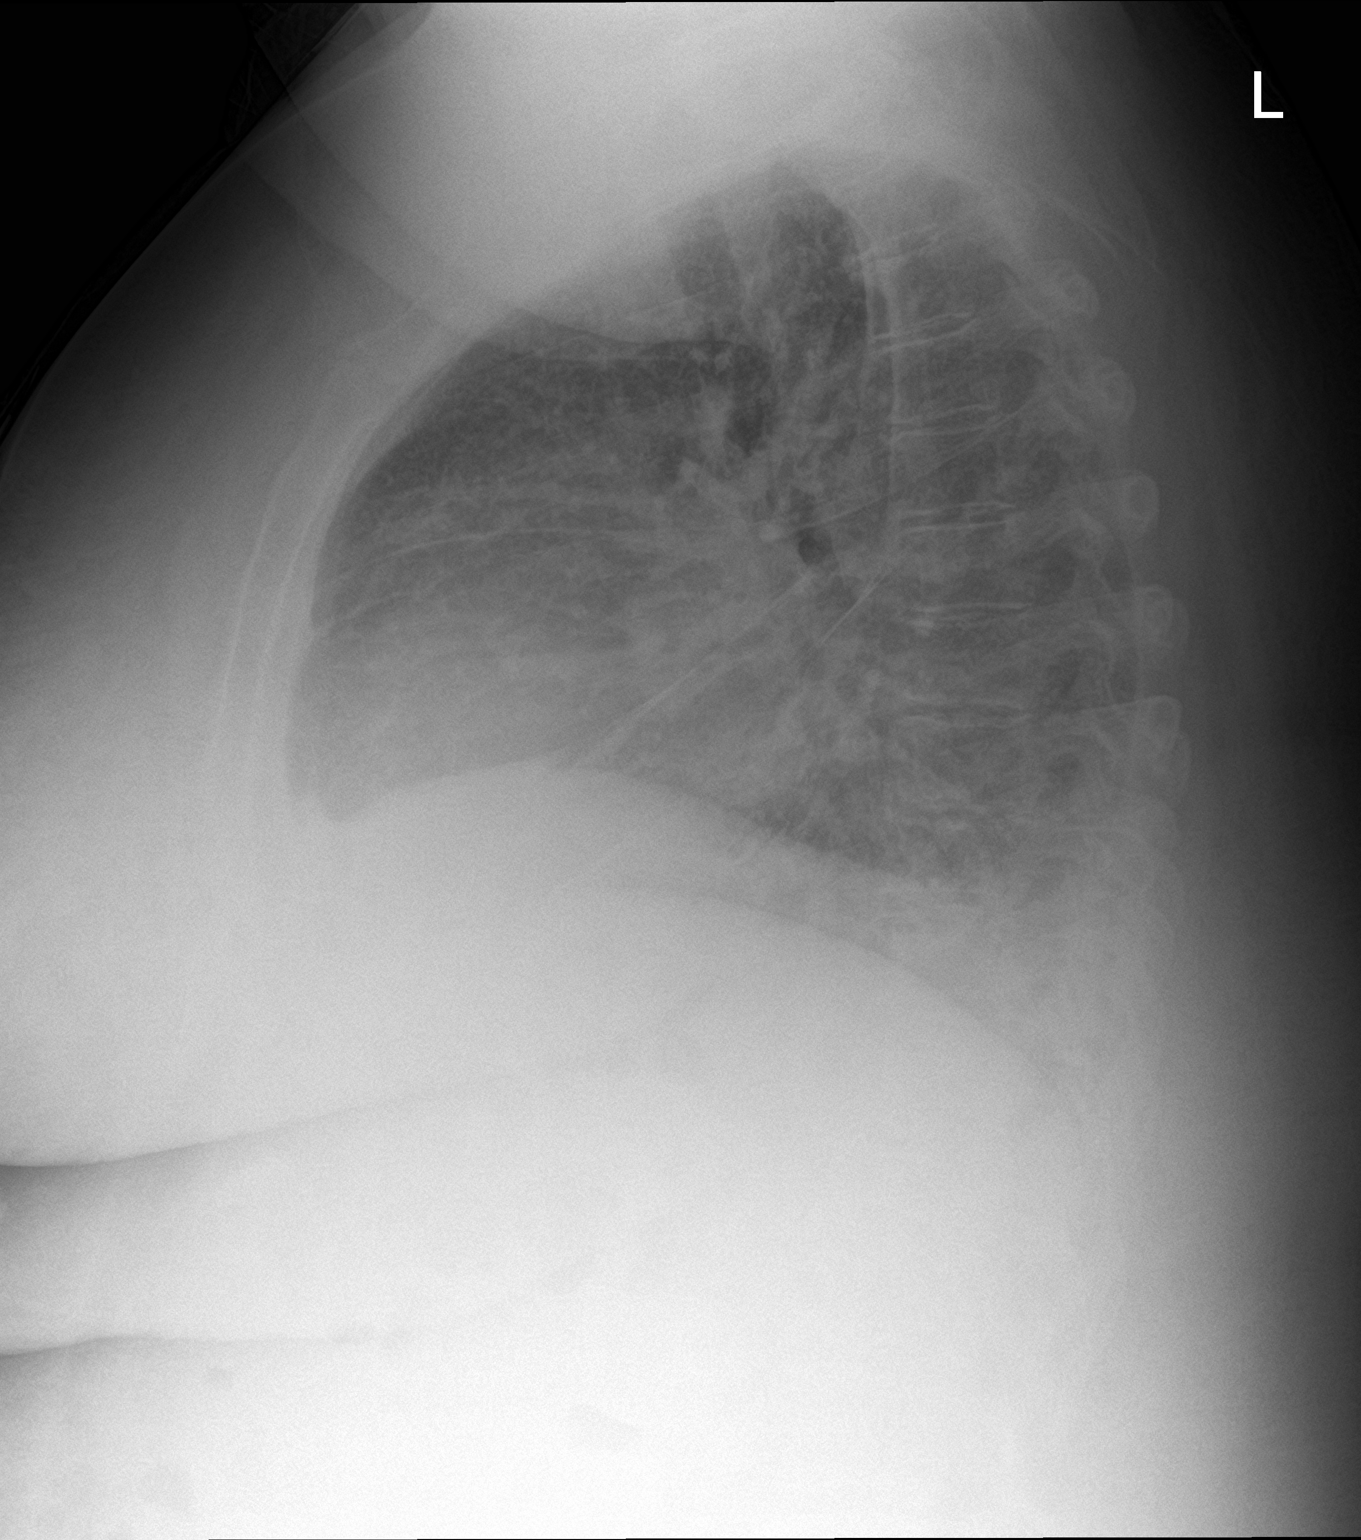

[2 of 2 positions shown; findings below may reference images not displayed]

FINDINGS: The patient has a small right pleural effusion. There is pulmonary
edema. No consolidative process or pneumothorax. Heart size is upper
normal.
IMPRESSION: Pulmonary edema with a small right pleural effusion.

## 2020-09-15 MED ORDER — FUROSEMIDE 10 MG/ML IJ SOLN
80.0000 mg | Freq: Once | INTRAMUSCULAR | Status: AC
  Administered 2020-09-15: 80 mg via INTRAVENOUS
  Filled 2020-09-15: qty 8

## 2020-09-15 NOTE — H&P (Signed)
History and Physical  [name] WSF:[id number] DOB: 1975/11/14 DOA: 09/15/2020  Referring physician:   PCP: [name], MD  Patient coming from: Home  Chief Complaint: Shortness of breath  HPI: Lauren Osborn is a 45 y.o. 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  Patient states that she was on Lasix 20 mg with her PCP who unfortunately relocated, she was able to follow-up with another PCP who started her on Lasix 20 mg and spironolactone 20 mg, however, patient states that she had to wait about 2 weeks without being on any diuretics prior to being able to obtain an appointment with a new PCP.  She was also referred to the cardiologist, Dr. Domenic Polite who increased Lasix to 80 mg and an echocardiogram done in July 2021 showed LVEF of 70 to 75% with no regional wall motion abnormality.  Despite this, patient continues to gain weight and she states that she has gained more than 70 pounds within last 3 months.  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  Patient states that she called Dr. Myles Gip office this morning and she was asked to go to the ED for IV diuresis. She denies fever, chills, chest pain, orthopnea, headache, nausea, vomiting.  ED Course:  In the emergency department, she was hemodynamically stable.  Work-up in the ED showed normal CBC, hypokalemia, BUN/creatinine 20/1.48 (baseline creatinine 1.0-1.1).  BNP 318 Chest x-ray showed pulmonary edema with a small right pleural effusion IV Lasix 80 Mg x1 was given.  Hospitalist was  asked to admit patient for further evaluation and management.  Review of Systems: Constitutional: Negative for chills and fever.  HENT: Negative for ear pain and sore throat.   Eyes: Negative for pain and visual disturbance.  Respiratory: Positive for shortness of breath.  Negative for cough  Cardiovascular: Negative for chest pain and palpitations.  Gastrointestinal: Increased abdominal girth.  Negative for vomiting.  Endocrine: Negative for polyphagia and polyuria.  Genitourinary: Negative for decreased urine volume, dysuria, enuresis Musculoskeletal: Negative for arthralgias and back pain.  Skin: Negative for color change and rash.  Allergic/Immunologic: Negative for immunocompromised state.  Neurological: Negative for tremors, syncope, speech difficulty, weakness, light-headedness and headaches.  Hematological: Does not bruise/bleed easily.  All other systems reviewed and are negative  Past Medical History:  Diagnosis Date  . Essential hypertension   . Fibroids   . Gout   . Thyroid nodule    Past Surgical History:  Procedure Laterality Date  . BIOPSY  08/03/2020   Procedure: BIOPSY;  Surgeon: Eloise Harman, DO;  [facility];  Service: Endoscopy;;  duodenal gastric  . CESAREAN SECTION    . COLONOSCOPY WITH PROPOFOL N/A 08/03/2020   Procedure: COLONOSCOPY WITH PROPOFOL;  Surgeon: Eloise Harman, DO;  [facility];  Service: Endoscopy;  Laterality: N/A;  1:15pm-rescheduled to 11/30 @ 8:00am  . ESOPHAGOGASTRODUODENOSCOPY (EGD) WITH PROPOFOL N/A 08/03/2020   Procedure: ESOPHAGOGASTRODUODENOSCOPY (EGD) WITH PROPOFOL;  Surgeon: Eloise Harman, DO;  [facility];  Service: Endoscopy;  Laterality: N/A;  . HYSTERECTOMY ABDOMINAL WITH  SALPINGECTOMY  2008  . POLYPECTOMY  08/03/2020   Procedure: POLYPECTOMY;  Surgeon: Eloise Harman, DO;  [facility];  Service: Endoscopy;;  colon  . THYROIDECTOMY Right 10/21/2018   Procedure:  RIGHT HEMITHYROIDECTOMY;  Surgeon: Leta Baptist, MD;  Location: [facility];  Service: ENT;  Laterality: Right;    Social History:  reports that she quit smoking about 5 years ago. Her smoking use included cigarettes. She has a 10.00 pack-year smoking history. She has never used smokeless tobacco. She reports previous alcohol use. She reports that she does not use drugs.   Allergies  Allergen Reactions  . Oxycodone-Acetaminophen Hives and Other (See Comments)    Family History  Problem Relation Age of Onset  . Breast cancer Mother   . Other Paternal Grandfather        house fire  . Other Maternal Grandmother        house fire  . Congestive Heart Failure Maternal Grandfather   . Colon cancer Father 9  . Diverticulitis Brother   . Diverticulitis Sister   . Colon polyps Sister 84  . Diabetes Daughter        borderline    Prior to Admission medications   Medication Sig Start Date End Date Taking? Authorizing Provider  acetaminophen (TYLENOL) 500 MG tablet Take 1,000 mg by mouth every 6 (six) hours as needed for moderate pain or headache.    [provider]  FEROSUL 325 (65 Fe) MG tablet Take 325 mg by mouth 2 (two) times daily. 02/14/20   [provider]  furosemide (LASIX) 80 MG tablet Take 1 tablet (80 mg total) by mouth daily. 09/09/20 12/08/20  Satira Sark, MD  omeprazole (PRILOSEC) 20 MG capsule Take 1 capsule (20 mg total) by mouth 2 (two) times daily. 08/03/20 01/30/21  Eloise Harman, DO  potassium chloride (KLOR-CON) 10 MEQ tablet Take 2 tablets (20 mEq total) by mouth daily. 09/09/20   Satira Sark, MD  spironolactone (ALDACTONE) 25 MG tablet Take 25 mg by mouth daily.    [provider]    Physical Exam: BP 126/83   Pulse 89   Temp 97.8 F (36.6 C) (Oral)   Resp (!) 22   Ht 5\' 3"  (1.6 m)   Wt (!) 162.4 kg   SpO2 99%   BMI 63.42 kg/m   . General: 45 y.o. year-old female well developed well nourished in no acute  distress.  Alert and oriented x3. Marland Kitchen HEENT: NCAT, EOMI . Neck: Supple, trachea medial . Cardiovascular: Regular rate and rhythm with no rubs or gallops. 2/4 pulses in all 4 extremities. Marland Kitchen Respiratory: Rales bilaterally in lower lobes (L > R ). No rales . Abdomen: Anasarca and dependent edema abdominal pannus.  Normal bowel sounds x4 quadrants. . Muskuloskeletal: Bilateral lower extremity edema to the torso area.  No cyanosis or clubbing  noted bilaterally . Neuro: CN II-XII intact, strength, sensation, reflexes intact . Skin: No ulcerative lesions noted or rashes . Psychiatry: Judgement and insight appear normal. Mood is appropriate for condition and setting          Labs on Admission:  Basic Metabolic Panel: Recent Labs  Lab 09/09/20 1305 09/15/20 1548  NA 138 139  K 3.1* 3.4*  CL 104 103  CO2 23 25  GLUCOSE 95 104*  BUN 22* 20  CREATININE 1.44* 1.48*  CALCIUM 9.3 9.1   Liver Function Tests: Recent Labs  Lab 09/15/20 1548  AST 26  ALT 16  ALKPHOS 51  BILITOT 2.4*  PROT 7.0  ALBUMIN 4.2   No results for input(s): LIPASE, AMYLASE in the last 168 hours. No results for input(s): AMMONIA in the last 168 hours. CBC: Recent Labs  Lab 09/15/20 1548  WBC 8.9  HGB 12.7  HCT 40.8  MCV 95.1  PLT 248   Cardiac Enzymes: No results for input(s): CKTOTAL, CKMB, CKMBINDEX, TROPONINI in the last 168 hours.  BNP (last 3 results) Recent Labs    09/15/20 1548  BNP 318.0*    ProBNP (last 3 results) No results for input(s): PROBNP in the last 8760 hours.  CBG: No results for input(s): GLUCAP in the last 168 hours.  Radiological Exams on Admission: DG Chest 2 View  Result Date: 09/15/2020 CLINICAL DATA:  Increasing shortness of breath and wheezing for 1 month. EXAM: CHEST - 2 VIEW COMPARISON:  Single-view of the chest 02/06/2020. FINDINGS: The patient has a small right pleural effusion. There is pulmonary edema. No consolidative process or pneumothorax. Heart size is  upper normal. IMPRESSION: Pulmonary edema with a small right pleural effusion. Electronically Signed   By: Inge Rise M.D.   On: 09/15/2020 14:25    EKG: I independently viewed the EKG done and my findings are as followed: Normal sinus rhythm with low voltage QRS at a rate of QTc 530 ms  Assessment/Plan Present on Admission: **None**  Active Problems:   Pulmonary edema   Hypokalemia   Prolonged QT interval   Acute kidney injury (HCC)   Elevated brain natriuretic peptide (BNP) level   GERD (gastroesophageal reflux disease)  Pulmonary edema and anasarca R/O cardiac cause Elevated BNP Chest x-ray showed pulmonary edema with a small right pleural effusion BNP 318 (creatinine elevated from baseline) Patient presents with anasarca; She states that she gained >70lbs in 3 months Continue total input/output, daily weights and fluid restriction Continue IV Lasix 40 twice daily Continue Cardiac diet  Echocardiogram done in July 2021 showed LVEF of 70-75% with no regional wall motion abnormalities.  Echocardiogram will be done in the morning  Considering patient's history of partial thyroidectomy, TSH will be checked  Prolonged QTc (530 ms) Avoid QT prolonging drugs Magnesium level will be checked Continue telemetry  Hypokalemia K+ 3.4; this will be replenished  Acute kidney injury BUN/creatinine 20/1.48 (baseline creatinine 1.0-1.1) Renally adjust medications, avoid nephrotoxic agents/dehydration/hypotension Monitor for worsening creatinine while on Lasix  GERD Continue Protonix  Super Obese (BMI 63.4) Recent weight gain may be a contributing factor Diet and Lifestyle modification counseled She will need to follow up with PCP for wt. loss program   DVT prophylaxis: Heparin subcu  Code Status: Full code  Family Communication: None at bedside  Disposition Plan:  Patient is from:                        home Anticipated DC to:                   home Anticipated DC  date:               2-3 days Anticipated DC barriers:           Patient is not stable to be discharged at this time due to shortness of breath on exertion secondary to pulmonary edema requiring inpatient management    Consults called: None  Admission status: Inpatient    Bernadette Hoit MD [facility]  09/16/2020, 12:42 AM

## 2020-09-15 NOTE — Telephone Encounter (Signed)
Spoke with pt and notes given. Pt states that will be seen in  ER on today.

## 2020-09-15 NOTE — Telephone Encounter (Signed)
Patient called stating she doesn't think the medication changes from her visit with Dr. Domenic Polite last week are helping or working.   She still has a lot of fluid - she is unsure of her current weight, she's not been going to the bathroom as often as she should, the pants she's been wearing are beginning to not fit.   Is currently short of breath, she is unable to go to the restroom without getting out of breath. States no chest pain. She is having pain and aching all over her body due to the swelling.   Please give pt a call @ [phone]

## 2020-09-15 NOTE — Telephone Encounter (Signed)
Please see my recent consultation note.  I had concerns at that time based on the amount of fluid overload she was experiencing, that oral diuretics may not be enough and I already talked with her about the possibility of hospitalization for IV diuretics.  See if there is any room for her to have a visit with [facility] today.  If she is progressively short of breath, it might be best for her to be evaluated in the ER for admission and IV diuresis.

## 2020-09-15 NOTE — ED Triage Notes (Signed)
Pt presents to ED with continued SOB. Pt seen Dr Domenic Polite last week and was given lasix 80 mg to try and get rid of the fluid, pt states she called Dr Domenic Polite this morning and was told to come to the ED because she is still retaining fluid.

## 2020-09-15 NOTE — ED Notes (Signed)
Admitting doctor at bedside 

## 2020-09-15 NOTE — ED Provider Notes (Signed)
[facility] Provider Note   CSN: [id number] Arrival date & time: 09/15/20  1329     History Chief Complaint  Patient presents with  . Shortness of Breath    Lauren Osborn is a 45 y.o. female.  HPI   This patient is a very pleasant 45 year old female who has a known history of hypertension, and significant peripheral edema for which she is unsure of the cause.  The patient reports to me that she has had a history of swelling of her legs for some time and had been on furosemide however was unable to get refills from her family doctor for some reason, she switched doctors and was ultimately started back on her furosemide at 20 mg/day.  She had increasing swelling which became significantly uncomfortable and these doses of Lasix have increased and now taking 80 mg/day however she has continued to gain weight, her close do not fit, she is extremely dyspneic and ultimately was cautioned that she needed to be evaluated in the emergency department and likely hospitalized with intravenous diuresis.  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  She had an echocardiogram performed last year which showed an ejection fraction of between 70 and 75%, there is no obvious congestive heart failure.  This patient is unsure if she has any liver damage or disease, she is unsure if she has any renal dysfunction however review of the medical record shows that her renal function has been slightly off with a creatinine of 1.4 when it was most recently checked a month ago.  No fevers, no chills, no coughing, no back pain, no abdominal pain other than feeling abdominal swelling which is significant and severe and progressive.  She believes she has gained about  65 to 70 pounds over the last several months  Past Medical History:  Diagnosis Date  . Essential hypertension   . Fibroids   . Gout   . Thyroid nodule     Patient Active Problem List   Diagnosis Date Noted  . Normocytic anemia 04/23/2020  . Family history of colon cancer 04/23/2020  . Mass of upper outer quadrant of right breast 11/04/2019  . S/P partial thyroidectomy 10/21/2018  . Visit for routine gyn exam 07/03/2018    Past Surgical History:  Procedure Laterality Date  . BIOPSY  08/03/2020   Procedure: BIOPSY;  Surgeon: Eloise Harman, DO;  [facility];  Service: Endoscopy;;  duodenal gastric  . CESAREAN SECTION    . COLONOSCOPY WITH PROPOFOL N/A 08/03/2020   Procedure: COLONOSCOPY WITH PROPOFOL;  Surgeon: Eloise Harman, DO;  [facility];  Service: Endoscopy;  Laterality: N/A;  1:15pm-rescheduled to 11/30 @ 8:00am  . ESOPHAGOGASTRODUODENOSCOPY (EGD) WITH PROPOFOL N/A 08/03/2020   Procedure: ESOPHAGOGASTRODUODENOSCOPY (EGD) WITH PROPOFOL;  Surgeon: Eloise Harman, DO;  [facility];  Service: Endoscopy;  Laterality: N/A;  . HYSTERECTOMY ABDOMINAL WITH SALPINGECTOMY  2008  . POLYPECTOMY  08/03/2020   Procedure: POLYPECTOMY;  Surgeon: Eloise Harman, DO;  [facility];  Service: Endoscopy;;  colon  . THYROIDECTOMY Right 10/21/2018   Procedure: RIGHT HEMITHYROIDECTOMY;  Surgeon: Leta Baptist, MD;  Location: [facility];  Service: ENT;  Laterality: Right;     OB  History    Gravida  2   Para  1   Term  1   Preterm      AB  1   Living  1     SAB      IAB      Ectopic      Multiple      Live Births              Family History  Problem Relation Age of Onset  . Breast cancer Mother   . Other Paternal Grandfather        house fire  . Other Maternal Grandmother        house fire  . Congestive Heart Failure Maternal Grandfather   . Colon cancer Father 31  . Diverticulitis Brother   .  Diverticulitis Sister   . Colon polyps Sister 68  . Diabetes Daughter        borderline    Social History   Tobacco Use  . Smoking status: Former Smoker    Packs/day: 0.50    Years: 20.00    Pack years: 10.00    Types: Cigarettes    Quit date: 2017    Years since quitting: 5.0  . Smokeless tobacco: Never Used  Vaping Use  . Vaping Use: Never used  Substance Use Topics  . Alcohol use: Not Currently  . Drug use: Never    Home Medications Prior to Admission medications   Medication Sig Start Date End Date Taking? Authorizing Provider  acetaminophen (TYLENOL) 500 MG tablet Take 1,000 mg by mouth every 6 (six) hours as needed for moderate pain or headache.    [provider]  FEROSUL 325 (65 Fe) MG tablet Take 325 mg by mouth 2 (two) times daily. 02/14/20   [provider]  furosemide (LASIX) 80 MG tablet Take 1 tablet (80 mg total) by mouth daily. 09/09/20 12/08/20  Satira Sark, MD  omeprazole (PRILOSEC) 20 MG capsule Take 1 capsule (20 mg total) by mouth 2 (two) times daily. 08/03/20 01/30/21  Eloise Harman, DO  potassium chloride (KLOR-CON) 10 MEQ tablet Take 2 tablets (20 mEq total) by mouth daily. 09/09/20   Satira Sark, MD  spironolactone (ALDACTONE) 25 MG tablet Take 25 mg by mouth daily.    [provider]    Allergies    Oxycodone-acetaminophen  Review of Systems   Review of Systems  All other systems reviewed and are negative.   Physical Exam Updated Vital Signs BP (!) 124/97 (BP Location: Right Arm)   Pulse 91   Temp 97.8 F (36.6 C) (Oral)   Resp 18   Ht 1.6 m (5\' 3" )   Wt (!) 162.4 kg   SpO2 100%   BMI 63.42 kg/m   Physical Exam Vitals and nursing note reviewed.  Constitutional:      General: She is not in acute distress.    Appearance: She is well-developed and well-nourished.  HENT:     Head: Normocephalic and atraumatic.     Mouth/Throat:     Mouth: Oropharynx is clear and moist.     Pharynx: No  oropharyngeal exudate.  Eyes:     General: No scleral icterus.       Right eye: No discharge.        Left eye: No discharge.     Extraocular Movements: EOM normal.     Conjunctiva/sclera: Conjunctivae normal.     Pupils: Pupils are equal, round, and reactive to light.  Neck:     Thyroid: No thyromegaly.     Vascular: No JVD.  Cardiovascular:     Rate and Rhythm: Normal rate and regular rhythm.     Pulses: Intact distal pulses.     Heart sounds: Normal heart sounds. No murmur heard. No friction rub. No gallop.   Pulmonary:     Effort: Respiratory distress present.     Breath sounds: Normal breath sounds. No wheezing or rales.     Comments: Subtle rales bilaterally, when the patient ambulates she is in severe respiratory distress, when she rests on the bed after about 5 minutes her breathing rate slows down to 18 breaths/min. Abdominal:     General: Bowel sounds are normal. There is no distension.     Palpations: Abdomen is soft. There is no mass.     Tenderness: There is no abdominal tenderness.     Comments: Severe anasarca and dependent edema of the abdominal pannus  Musculoskeletal:        General: No tenderness. Normal range of motion.     Cervical back: Normal range of motion and neck supple.     Right lower leg: Edema present.     Left lower leg: Edema present.     Comments: Severe bilateral edema of the legs extending all the way to the lower chest with anasarca  Lymphadenopathy:     Cervical: No cervical adenopathy.  Skin:    General: Skin is warm and dry.     Findings: No erythema or rash.  Neurological:     Mental Status: She is alert.     Coordination: Coordination normal.  Psychiatric:        Mood and Affect: Mood and affect normal.        Behavior: Behavior normal.     ED Results / Procedures / Treatments   Labs (all labs ordered are listed, but only abnormal results are displayed) Labs Reviewed  BASIC METABOLIC PANEL - Abnormal; Notable for the following  components:      Result Value   Potassium 3.4 (*)    Glucose, Bld 104 (*)    Creatinine, Ser 1.48 (*)    GFR, Estimated 44 (*)    All other components within normal limits  CBC - Abnormal; Notable for the following components:   RDW 17.1 (*)    All other components within normal limits  BRAIN NATRIURETIC PEPTIDE - Abnormal; Notable for the following components:   B Natriuretic Peptide 318.0 (*)    All other components within normal limits  RESP PANEL BY RT-PCR (FLU A&B, COVID) ARPGX2  HEPATIC FUNCTION PANEL  URINALYSIS, ROUTINE W REFLEX MICROSCOPIC    EKG EKG Interpretation  Date/Time:  Wednesday September 15 2020 13:37:15 EST Ventricular Rate:  95 PR Interval:  152 QRS Duration: 84 QT Interval:  422 QTC Calculation: 530 R Axis:   82 Text Interpretation: Normal sinus rhythm Low voltage QRS Cannot rule out Inferior infarct , age undetermined Abnormal ECG Since last tracing LOW VOLTAGE Confirmed by Noemi Chapel [phone]) on 09/15/2020 7:23:55 PM   Radiology DG Chest 2 View  Result Date: 09/15/2020 CLINICAL DATA:  Increasing shortness of breath and wheezing for 1 month. EXAM: CHEST - 2 VIEW COMPARISON:  Single-view of the chest 02/06/2020. FINDINGS: The patient has a small right pleural effusion. There is pulmonary edema. No consolidative process or pneumothorax. Heart size is upper normal. IMPRESSION: Pulmonary edema with a small right pleural effusion. Electronically Signed   By: Inge Rise  M.D.   On: 09/15/2020 14:25    Procedures Procedures (including critical care time)  Medications Ordered in ED Medications  furosemide (LASIX) injection 80 mg (has no administration in time range)    ED Course  I have reviewed the triage vital signs and the nursing notes.  Pertinent labs & imaging results that were available during my care of the patient were reviewed by me and considered in my medical decision making (see chart for details).    MDM Rules/Calculators/A&P                           This patient's oxygen level at rest is about 97% however she becomes severely dyspneic with any action and has anasarca which is severe and diffuse.  We will add on a urinalysis to look for proteinuria, will add liver function to look for her ability to synthesize protein, she will need to be admitted to the hospital for IV diuresis and the initial dose of 80 mg of Lasix has been administered in the ER.  She will be admitted with respiratory distress with fluid overloaded state.  R./o covid.  X-ray does confirm pulmonary edema  EKG without any significant acute findings.  There is some low voltage which is not unexpected given the thickness of her chest wall, given fluid retention  I discussed the patient's care with the hospitalist who will admit the patient to the hospital.  Lauren Osborn was evaluated in Emergency Department on 09/15/2020 for the symptoms described in the history of present illness. She was evaluated in the context of the global COVID-19 pandemic, which necessitated consideration that the patient might be at risk for infection with the SARS-CoV-2 virus that causes COVID-19. Institutional protocols and algorithms that pertain to the evaluation of patients at risk for COVID-19 are in a state of rapid change based on information released by regulatory bodies including the CDC and federal and state organizations. These policies and algorithms were followed during the patient's care in the ED.   Final Clinical Impression(s) / ED Diagnoses Final diagnoses:  Anasarca  Acute pulmonary edema ([place])  Hypokalemia      Noemi Chapel, MD 09/15/20 2238

## 2020-09-16 ENCOUNTER — Encounter (HOSPITAL_COMMUNITY): Payer: Self-pay | Admitting: Internal Medicine

## 2020-09-16 ENCOUNTER — Inpatient Hospital Stay (HOSPITAL_COMMUNITY): Payer: Managed Care, Other (non HMO)

## 2020-09-16 ENCOUNTER — Other Ambulatory Visit: Payer: Self-pay

## 2020-09-16 DIAGNOSIS — Z87891 Personal history of nicotine dependence: Secondary | ICD-10-CM | POA: Diagnosis not present

## 2020-09-16 DIAGNOSIS — N183 Chronic kidney disease, stage 3 unspecified: Secondary | ICD-10-CM | POA: Diagnosis not present

## 2020-09-16 DIAGNOSIS — R7989 Other specified abnormal findings of blood chemistry: Secondary | ICD-10-CM | POA: Diagnosis not present

## 2020-09-16 DIAGNOSIS — I130 Hypertensive heart and chronic kidney disease with heart failure and stage 1 through stage 4 chronic kidney disease, or unspecified chronic kidney disease: Secondary | ICD-10-CM | POA: Diagnosis not present

## 2020-09-16 DIAGNOSIS — K219 Gastro-esophageal reflux disease without esophagitis: Secondary | ICD-10-CM | POA: Diagnosis present

## 2020-09-16 DIAGNOSIS — N179 Acute kidney failure, unspecified: Secondary | ICD-10-CM | POA: Diagnosis present

## 2020-09-16 DIAGNOSIS — I5033 Acute on chronic diastolic (congestive) heart failure: Secondary | ICD-10-CM

## 2020-09-16 DIAGNOSIS — R0600 Dyspnea, unspecified: Secondary | ICD-10-CM

## 2020-09-16 DIAGNOSIS — R601 Generalized edema: Secondary | ICD-10-CM | POA: Diagnosis not present

## 2020-09-16 DIAGNOSIS — R9431 Abnormal electrocardiogram [ECG] [EKG]: Secondary | ICD-10-CM | POA: Diagnosis not present

## 2020-09-16 DIAGNOSIS — Z20822 Contact with and (suspected) exposure to covid-19: Secondary | ICD-10-CM | POA: Diagnosis not present

## 2020-09-16 DIAGNOSIS — Z8249 Family history of ischemic heart disease and other diseases of the circulatory system: Secondary | ICD-10-CM | POA: Diagnosis not present

## 2020-09-16 DIAGNOSIS — E876 Hypokalemia: Secondary | ICD-10-CM

## 2020-09-16 DIAGNOSIS — Z79899 Other long term (current) drug therapy: Secondary | ICD-10-CM | POA: Diagnosis not present

## 2020-09-16 DIAGNOSIS — E669 Obesity, unspecified: Secondary | ICD-10-CM

## 2020-09-16 DIAGNOSIS — J811 Chronic pulmonary edema: Secondary | ICD-10-CM

## 2020-09-16 DIAGNOSIS — J810 Acute pulmonary edema: Secondary | ICD-10-CM | POA: Diagnosis not present

## 2020-09-16 DIAGNOSIS — Z6841 Body Mass Index (BMI) 40.0 and over, adult: Secondary | ICD-10-CM | POA: Diagnosis not present

## 2020-09-16 DIAGNOSIS — Z833 Family history of diabetes mellitus: Secondary | ICD-10-CM | POA: Diagnosis not present

## 2020-09-16 LAB — COMPREHENSIVE METABOLIC PANEL
ALT: 14 U/L (ref 0–44)
AST: 26 U/L (ref 15–41)
Albumin: 4 g/dL (ref 3.5–5.0)
Alkaline Phosphatase: 49 U/L (ref 38–126)
Anion gap: 11 (ref 5–15)
BUN: 20 mg/dL (ref 6–20)
CO2: 26 mmol/L (ref 22–32)
Calcium: 8.9 mg/dL (ref 8.9–10.3)
Chloride: 102 mmol/L (ref 98–111)
Creatinine, Ser: 1.58 mg/dL — ABNORMAL HIGH (ref 0.44–1.00)
GFR, Estimated: 41 mL/min — ABNORMAL LOW (ref >60)
Glucose, Bld: 100 mg/dL — ABNORMAL HIGH (ref 70–99)
Potassium: 2.9 mmol/L — ABNORMAL LOW (ref 3.5–5.1)
Sodium: 139 mmol/L (ref 135–145)
Total Bilirubin: 2.4 mg/dL — ABNORMAL HIGH (ref 0.3–1.2)
Total Protein: 6.7 g/dL (ref 6.5–8.1)

## 2020-09-16 LAB — URINALYSIS, ROUTINE W REFLEX MICROSCOPIC
Bilirubin Urine: NEGATIVE
Glucose, UA: NEGATIVE mg/dL
Hgb urine dipstick: NEGATIVE
Ketones, ur: NEGATIVE mg/dL
Nitrite: NEGATIVE
Protein, ur: NEGATIVE mg/dL
Specific Gravity, Urine: 1.006 (ref 1.005–1.030)
pH: 5 (ref 5.0–8.0)

## 2020-09-16 LAB — ECHOCARDIOGRAM COMPLETE
AR max vel: 2.21 cm2
AV Area VTI: 2.54 cm2
AV Area mean vel: 2.24 cm2
AV Mean grad: 3 mmHg
AV Peak grad: 6.2 mmHg
Ao pk vel: 1.24 m/s
Area-P 1/2: 3.76 cm2
Height: 63 in
S' Lateral: 2.6 cm
Weight: 5728 oz

## 2020-09-16 LAB — HIV ANTIBODY (ROUTINE TESTING W REFLEX): HIV Screen 4th Generation wRfx: NONREACTIVE

## 2020-09-16 LAB — PROTIME-INR
INR: 1.3 — ABNORMAL HIGH (ref 0.8–1.2)
Prothrombin Time: 15.3 seconds — ABNORMAL HIGH (ref 11.4–15.2)

## 2020-09-16 LAB — PHOSPHORUS: Phosphorus: 4.1 mg/dL (ref 2.5–4.6)

## 2020-09-16 LAB — CBC
HCT: 38.1 % (ref 36.0–46.0)
Hemoglobin: 12.1 g/dL (ref 12.0–15.0)
MCH: 29.9 pg (ref 26.0–34.0)
MCHC: 31.8 g/dL (ref 30.0–36.0)
MCV: 94.1 fL (ref 80.0–100.0)
Platelets: 247 10*3/uL (ref 150–400)
RBC: 4.05 MIL/uL (ref 3.87–5.11)
RDW: 17.2 % — ABNORMAL HIGH (ref 11.5–15.5)
WBC: 7.5 10*3/uL (ref 4.0–10.5)
nRBC: 0 % (ref 0.0–0.2)

## 2020-09-16 LAB — APTT: aPTT: 21 seconds — ABNORMAL LOW (ref 24–36)

## 2020-09-16 LAB — MAGNESIUM: Magnesium: 1.8 mg/dL (ref 1.7–2.4)

## 2020-09-16 MED ORDER — METOLAZONE 2.5 MG PO TABS
5.0000 mg | ORAL_TABLET | Freq: Once | ORAL | Status: AC
  Administered 2020-09-16: 5 mg via ORAL
  Filled 2020-09-16: qty 2

## 2020-09-16 MED ORDER — PERFLUTREN LIPID MICROSPHERE
1.0000 mL | INTRAVENOUS | Status: AC | PRN
  Administered 2020-09-16: 1 mL via INTRAVENOUS

## 2020-09-16 MED ORDER — POTASSIUM CHLORIDE CRYS ER 20 MEQ PO TBCR
40.0000 meq | EXTENDED_RELEASE_TABLET | Freq: Once | ORAL | Status: AC
  Administered 2020-09-16: 40 meq via ORAL
  Filled 2020-09-16: qty 2

## 2020-09-16 MED ORDER — HEPARIN SODIUM (PORCINE) 5000 UNIT/ML IJ SOLN
5000.0000 [IU] | Freq: Three times a day (TID) | INTRAMUSCULAR | Status: DC
  Administered 2020-09-16 – 2020-09-21 (×14): 5000 [IU] via SUBCUTANEOUS
  Filled 2020-09-16 (×17): qty 1

## 2020-09-16 MED ORDER — FUROSEMIDE 10 MG/ML IJ SOLN
40.0000 mg | Freq: Two times a day (BID) | INTRAMUSCULAR | Status: DC
  Administered 2020-09-16 – 2020-09-17 (×3): 40 mg via INTRAVENOUS
  Filled 2020-09-16 (×3): qty 4

## 2020-09-16 MED ORDER — PANTOPRAZOLE SODIUM 40 MG PO TBEC
40.0000 mg | DELAYED_RELEASE_TABLET | Freq: Every day | ORAL | Status: DC
  Administered 2020-09-16 – 2020-09-22 (×7): 40 mg via ORAL
  Filled 2020-09-16 (×7): qty 1

## 2020-09-16 MED ORDER — POTASSIUM CHLORIDE CRYS ER 20 MEQ PO TBCR
40.0000 meq | EXTENDED_RELEASE_TABLET | ORAL | Status: AC
  Administered 2020-09-16 (×2): 40 meq via ORAL
  Filled 2020-09-16 (×2): qty 2

## 2020-09-16 NOTE — Consult Note (Addendum)
Cardiology Consult    Patient ID: Lauren Osborn; [id number]; 14-Jun-1976   Admit date: 09/15/2020 Date of Consult: 09/16/2020  Primary Care Provider: [name], MD Primary Cardiologist: Rozann Lesches, MD   Patient Profile    Lauren Osborn is a 45 y.o. female with past medical history of chronic diastrolic CHF, HTN and GERD who is being seen today for the evaluation of CHF at the request of Dr. Josephine Cables.   History of Present Illness    Lauren Osborn was recently evaluated by Dr. Domenic Polite on 09/09/2020 and reported worsening abdominal distention and lower extremity edema over the past 6 months with associated dyspnea. Weight was recorded at 351 lbs during her visit. She was taking Lasix 40 mg daily and this was increased to 80mg  daily. She called the office on 09/15/2020 reporting progressive dyspnea and no improvement in her fluid status despite compliance with her diuretic, therefore she was advised to go to the Emergency Department.   In talking with the patient today, she reports she has continued to experience progressive dyspnea on exertion, abdominal distension and lower extremity edema since 06/2020. Feels "swollen all over". Reports associated orthopnea and PND. No recent chest pain or palpitations. Denies any acute changes in her dietary habits. Weight at the time of admission was 358 lbs and she reports a baseline weight of 280 - 290 lbs.  Initial labs show WBC 8.9, Hgb 12.7, platelets 248, Na+ 139, K+ 3.4 and creatinine 1.48 (similar to prior values earlier this month but previously 1.0 - 1.1 in 2021). AST 26 and ALT 16. BNP 318. COVID negative. CXR shows pulmonary edema with a small right pleural effusion. EKG shows NSR, HR 95 with no acute ST changes from baseline.   Recorded net output of -1.4L thus far and she received IV Lasix 80mg  yesterday and another 40mg  overnight. Scheduled to receive IV Lasix 40mg  BID today and the admitting team already ordered Metolazone 5mg  daily.   Labs this AM show creatinine has trended up to 1.58.  Past Medical History:  Diagnosis Date  . Essential hypertension   . Fibroids   . Gout   . Thyroid nodule     Past Surgical History:  Procedure Laterality Date  . BIOPSY  08/03/2020   Procedure: BIOPSY;  Surgeon: Eloise Harman, DO;  Location: [facility];  Service: Endoscopy;;  duodenal gastric  . CESAREAN SECTION    . COLONOSCOPY WITH PROPOFOL N/A 08/03/2020   Procedure: COLONOSCOPY WITH PROPOFOL;  Surgeon: Eloise Harman, DO;  Location: [facility];  Service: Endoscopy;  Laterality: N/A;  1:15pm-rescheduled to 11/30 @ 8:00am  . ESOPHAGOGASTRODUODENOSCOPY (EGD) WITH PROPOFOL N/A 08/03/2020   Procedure: ESOPHAGOGASTRODUODENOSCOPY (EGD) WITH PROPOFOL;  Surgeon: Eloise Harman, DO;  Location: [facility];  Service: Endoscopy;  Laterality: N/A;  . HYSTERECTOMY ABDOMINAL WITH SALPINGECTOMY  2008  . POLYPECTOMY  08/03/2020   Procedure: POLYPECTOMY;  Surgeon: Eloise Harman, DO;  Location: [facility];  Service: Endoscopy;;  colon  . THYROIDECTOMY Right 10/21/2018   Procedure: RIGHT HEMITHYROIDECTOMY;  Surgeon: Leta Baptist, MD;  Location: [facility];  Service: ENT;  Laterality: Right;     Home Medications:  Prior to Admission medications   Medication Sig Start Date End Date Taking? Authorizing Provider  acetaminophen (TYLENOL) 500 MG tablet Take 1,000 mg by mouth every 6 (six) hours as needed for moderate pain or headache.    [provider]  FEROSUL 325 (65 Fe) MG tablet  Take 325 mg by mouth 2 (two) times daily. 02/14/20   [provider]  furosemide (LASIX) 80 MG tablet Take 1 tablet (80 mg total) by mouth daily. 09/09/20 12/08/20  Satira Sark, MD  omeprazole (PRILOSEC) 20 MG capsule Take 1 capsule (20 mg total) by mouth 2 (two) times daily. 08/03/20 01/30/21  Eloise Harman, DO  potassium chloride (KLOR-CON) 10 MEQ tablet Take 2 tablets (20 mEq total) by mouth daily. 09/09/20    Satira Sark, MD  spironolactone (ALDACTONE) 25 MG tablet Take 25 mg by mouth daily.    [provider]    Inpatient Medications: Scheduled Meds: . furosemide  40 mg Intravenous Q12H  . heparin injection (subcutaneous)  5,000 Units Subcutaneous Q8H  . pantoprazole  40 mg Oral Daily  . potassium chloride  40 mEq Oral Q3H   Continuous Infusions:  PRN Meds:   Allergies:    Allergies  Allergen Reactions  . Oxycodone-Acetaminophen Hives and Other (See Comments)    Social History:   Social History   Socioeconomic History  . Marital status: Single    Spouse name: Not on file  . Number of children: Not on file  . Years of education: Not on file  . Highest education level: Not on file  Occupational History  . Not on file  Tobacco Use  . Smoking status: Former Smoker    Packs/day: 0.50    Years: 20.00    Pack years: 10.00    Types: Cigarettes    Quit date: 2017    Years since quitting: 5.0  . Smokeless tobacco: Never Used  Vaping Use  . Vaping Use: Never used  Substance and Sexual Activity  . Alcohol use: Not Currently  . Drug use: Never  . Sexual activity: Yes    Comment: hyst  Other Topics Concern  . Not on file  Social History Narrative  . Not on file   Social Determinants of Health   Financial Resource Strain: Not on file  Food Insecurity: Not on file  Transportation Needs: Not on file  Physical Activity: Not on file  Stress: Not on file  Social Connections: Not on file  Intimate Partner Violence: Not on file     Family History:    Family History  Problem Relation Age of Onset  . Breast cancer Mother   . Other Paternal Grandfather        house fire  . Other Maternal Grandmother        house fire  . Congestive Heart Failure Maternal Grandfather   . Colon cancer Father 74  . Diverticulitis Brother   . Diverticulitis Sister   . Colon polyps Sister 15  . Diabetes Daughter        borderline      Review of Systems    General:   No chills, fever, night sweats or weight changes.  Cardiovascular:  No chest pain. Positive for dyspnea on exertion, edema and orthopnea.  Dermatological: No rash, lesions/masses Respiratory: No cough, Positive for dyspnea. Urologic: No hematuria, dysuria Abdominal:   No nausea, vomiting, diarrhea, bright red blood per rectum, melena, or hematemesis Neurologic:  No visual changes, wkns, changes in mental status. All other systems reviewed and are otherwise negative except as noted above.  Physical Exam/Data    Vitals:   09/16/20 0400 09/16/20 0500 09/16/20 0600 09/16/20 0854  BP: 114/74 (!) 118/95 116/81   Pulse: 74 88 84   Resp: 20 19 11    Temp:  TempSrc:      SpO2: 94% 100% 98% 98%  Weight:      Height:        Intake/Output Summary (Last 24 hours) at 09/16/2020 0905 Last data filed at 09/16/2020 0625 Gross per 24 hour  Intake -  Output 600 ml  Net -600 ml   Filed Weights   09/15/20 1341  Weight: (!) 162.4 kg   Body mass index is 63.42 kg/m.   General: Pleasant morbidly obese female appearing in NAD Psych: Normal affect. Neuro: Alert and oriented X 3. Moves all extremities spontaneously. HEENT: Normal  Neck: Supple without bruits. JVD difficult to assess secondary to body habitus. Lungs:  Resp regular and unlabored, decreased breath sounds along bases bilaterally. Heart: RRR no s3, s4, or murmurs. Abdomen: Appears distended and tender to palpation throughout. Bowel sounds present.  Extremities: No clubbing or cyanosis. 2+ pitting edema bilaterally. DP/PT/Radials 2+ and equal bilaterally.   EKG:  The EKG was personally reviewed and demonstrates: NSR, HR 95 with no acute ST changes from baseline.   Telemetry:  Telemetry was personally reviewed and demonstrates: NSR, HR in 70's to 90's. No significant arrhythmias.    Labs/Studies     Relevant CV Studies:  Echocardiogram: 03/2020 IMPRESSIONS    1. Left ventricular ejection fraction, by estimation, is 70  to 75%. The  left ventricle has hyperdynamic function. The left ventricle has no  regional wall motion abnormalities. There is mild left ventricular  hypertrophy. Left ventricular diastolic  parameters were normal.  2. Right ventricular systolic function is normal. The right ventricular  size is normal. There is normal pulmonary artery systolic pressure.  3. Left atrial size was mildly dilated.  4. The mitral valve is normal in structure. Trivial mitral valve  regurgitation.  5. The aortic valve is normal in structure. Aortic valve regurgitation is  not visualized.  6. The inferior vena cava is dilated in size with >50% respiratory  variability, suggesting right atrial pressure of 8 mmHg.   Laboratory Data:  Chemistry Recent Labs  Lab 09/09/20 1305 09/15/20 1548 09/16/20 0215  NA 138 139 139  K 3.1* 3.4* 2.9*  CL 104 103 102  CO2 23 25 26   GLUCOSE 95 104* 100*  BUN 22* 20 20  CREATININE 1.44* 1.48* 1.58*  CALCIUM 9.3 9.1 8.9  GFRNONAA 45* 44* 41*  ANIONGAP 11 11 11     Recent Labs  Lab 09/15/20 1548 09/16/20 0215  PROT 7.0 6.7  ALBUMIN 4.2 4.0  AST 26 26  ALT 16 14  ALKPHOS 51 49  BILITOT 2.4* 2.4*   Hematology Recent Labs  Lab 09/15/20 1548 09/16/20 0215  WBC 8.9 7.5  RBC 4.29 4.05  HGB 12.7 12.1  HCT 40.8 38.1  MCV 95.1 94.1  MCH 29.6 29.9  MCHC 31.1 31.8  RDW 17.1* 17.2*  PLT 248 247   Cardiac EnzymesNo results for input(s): TROPONINI in the last 168 hours. No results for input(s): TROPIPOC in the last 168 hours.  BNP Recent Labs  Lab 09/15/20 1548  BNP 318.0*    DDimer No results for input(s): DDIMER in the last 168 hours.  Radiology/Studies:  DG Chest 2 View  Result Date: 09/15/2020 CLINICAL DATA:  Increasing shortness of breath and wheezing for 1 month. EXAM: CHEST - 2 VIEW COMPARISON:  Single-view of the chest 02/06/2020. FINDINGS: The patient has a small right pleural effusion. There is pulmonary edema. No consolidative process or  pneumothorax. Heart size is upper normal. IMPRESSION: Pulmonary edema  with a small right pleural effusion. Electronically Signed   By: Inge Rise M.D.   On: 09/15/2020 14:25     Assessment & Plan    1. Acute on Chronic Diastolic CHF - She presents with a 26-month history of progressive dyspnea on exertion, abdominal distention and lower extremity edema which persisted despite titration of Lasix as an outpatient. Reports a baseline weight of 280 - 290 lbs and at 358 lbs on admission.  - BNP 318. CXR shows pulmonary edema with a small right pleural effusion. Agree with obtaining a repeat echocardiogram given the acute change in her status over the past few months.  - She already received Metolazone 5mg  today and is scheduled to receive IV Lasix 40mg  BID. Pending reassessment of renal function tomorrow, may titrate Lasix to TID dosing or consider initiation of a Lasix drip as she is 60 + lbs above her baseline weight.   2. Stage 3 CKD - Creatinine previously 1.0 - 1.1 in 2021, elevated to 1.44 on 09/09/2020 and at 1.48 on admission. Trending up to 1.58 today. Continue to follow closely with IV diuresis.   3. Hypokalemia - K+ 2.9 this AM. Supplementation has already been ordered by the admitting team.   4. Elevated Total Bilirubin  - Total Bili was elevated to 2.4 on admission.Recent abdominal US showed cholelithiasis with probable large single calculus 5.3 cm diameter and a hyperechoic mass along the RIGHT lobe liver 3.8 cm in size, felt to possibly represent a hepatic hemangioma but was not diagnostic MRI was recommended for further evaluation.  - Further evaluation per admitting team.    For questions or updates, please contact [facility] Please consult [email or web address] for contact info under Cardiology/STEMI.  Signed, Erma Heritage, PA-C 09/16/2020, 9:05 AM Pager: [phone]  Attending note Patient seen and discussed with PA Ahmed Prima, I agree with her documentation. 45 yo  female history of chronic diastolic HF, HTN, severe obesity BMI 63, presents with severe edema, abdominal distension, SOB refractory to home oral diuretics. From notes 70 lbs weight gain since December.    K 3.4 BUN 20 Cr 1.48 WBC 8.9 Hgb 12.7 Plt 248 BNP 318 TSH pending COVID neg CXR pulm edema, right pleural effusion EKG SR, low voltage 03/2020 echo: LVEF 70-75%, no WMAs, normal RV function  From charting 286 lbs 08/13/20 and 351 lbs Sep 09, 2020. On admit 356 lbs. Presents severely volume overloaded. Started on IV lasix, received 80mg  x 1 yesterday. Scheduled for 20mg  bid today, also given 5mg  of oral metolazone. Mild uptrend in Cr, monitor particularly with receiving metolazone today. Repeat echo given severe clinical change in volume status from 03/2020 time of her last echo. Depending on initial  diuresis and renal function patterns may consider lasix drip given the large amount of diuresis she will require.    Maryann Alar

## 2020-09-16 NOTE — Progress Notes (Signed)
*  PRELIMINARY RESULTS* Echocardiogram 2D Echocardiogram with definity has been performed.  Leavy Cella 09/16/2020, 9:55 AM

## 2020-09-16 NOTE — Progress Notes (Signed)
Patient Demographics:    Lauren Osborn, is a 45 y.o. female, DOB - 1975-12-19, XKG:[id number]  Admit date - 09/15/2020   Admitting Physician Bernadette Hoit, DO  Outpatient Primary MD for the patient is [name], MD  LOS - 0   Chief Complaint  Patient presents with  . Shortness of Breath        Subjective:    Lauren Osborn today has no fevers, no emesis,  No chest pain,   -Shortness of breath and dyspnea on exertion persist, patient's daughter at bedside, questions answered  Assessment  & Plan :    Active Problems:   Pulmonary edema   Hypokalemia   Prolonged QT interval   Acute kidney injury (HCC)   Elevated brain natriuretic peptide (BNP) level   GERD (gastroesophageal reflux disease)   Anasarca   Super obese  Brief Summary:- 45 y.o. female with medical history significant for hypertension and chronic lower extremity edema, admitted on 09/15/20 with progressive dyspnea on exertion, abdominal distension and lower extremity edema since 06/2020.  Compliance with oral diuretics--- on admission weight up to 358 pounds from recent wt around 280 lb (a few months ago her weight was 351lb)  A/p 1)HFpEF--- patient admitted with acute on chronic diastolic dysfunction CHF, repeat echo with preserved EF around 70% -BNP 318 but this may be underestimated in obese patients with diastolic dysfunction -Chest x-ray and clinical exam supports overload status/pulmonary edema -Failed oral diuretics,  --continue with iv Lasix, daily weights, fluid input and output charting -  2)AKI----acute kidney injury--- due to aggressive diuresis -GFR is greater than 60 and creatinine was 1.0 on 08/13/2020 -Creatinine on admission just 1.48 up to 1.58 now----monitor closely with diuresis, if patient does not tolerate bolus Lasix doses from the renal standpoint-Lasix drip may be better tolerated  renally adjust  medications, avoid nephrotoxic agents / dehydration  / hypotension  3)Hypokalemia--- magnesium WNL, replace and monitor closely with aggressive diuresis  4)Morbid Obesity- -Low calorie diet, portion control and increase physical activity discussed with patient -Body mass index is 63.42 kg/m.  5)H/o Prior partial thyroidectomy--- TSH pending   Disposition/Need for in-Hospital Stay- patient unable to be discharged at this time due to --- very significant volume overload/pulmonary edema, failed oral diuretics needs IV diuretics and monitoring of electrolytes and renal function while aggressively diuresing  Status is: Inpatient  Remains inpatient appropriate because:Please see below   Disposition: The patient is from: Home              Anticipated d/c is to: Home              Anticipated d/c date is: > 3 days              Patient currently is not medically stable to d/c. Barriers: Not Clinically Stable-   Code Status :  -  Code Status: Full Code   Family Communication:    NA (patient is alert, awake and coherent)   Consults  :  card  DVT Prophylaxis  :   - SCDs   heparin injection 5,000 Units Start: 09/16/20 1400 SCDs Start: 09/16/20 0137    Lab Results  Component Value Date   PLT 247 09/16/2020    Inpatient Medications  Scheduled  Meds: . furosemide  40 mg Intravenous Q12H  . heparin injection (subcutaneous)  5,000 Units Subcutaneous Q8H  . pantoprazole  40 mg Oral Daily   Continuous Infusions: PRN Meds:.    Anti-infectives (From admission, onward)   None        Objective:   Vitals:   09/16/20 1330 09/16/20 1400 09/16/20 1530 09/16/20 1600  BP: 111/81 110/72 111/81 107/80  Pulse: 87 86 88 88  Resp: 16 10 18 11   Temp:      TempSrc:      SpO2: 100% 98% 96% 97%  Weight:      Height:        Wt Readings from Last 3 Encounters:  09/15/20 (!) 162.4 kg  09/09/20 (!) 159.2 kg  08/13/20 129.7 kg     Intake/Output Summary (Last 24 hours) at 09/16/2020  1757 Last data filed at 09/16/2020 1733 Gross per 24 hour  Intake --  Output 2100 ml  Net -2100 ml     Physical Exam  Gen:- Awake Alert, morbidly obese, HEENT:- [place].AT, No sclera icterus Neck-Supple Neck, Lungs-diminished in bases, faint bibasilar rales CV- S1, S2 normal, regular  Abd-  +ve B.Sounds, Abd Soft, No tenderness, increased truncal adiposity    Extremity/Skin:- 3+  edema, pedal pulses present  Psych-affect is appropriate, oriented x3 Neuro-generalized weakness no new focal deficits, no tremors   Data Review:   Micro Results Recent Results (from the past 240 hour(s))  Resp Panel by RT-PCR (Flu A&B, Covid) Nasopharyngeal Swab     Status: None   Collection Time: 09/15/20 11:01 PM   Specimen: Nasopharyngeal Swab; Nasopharyngeal(NP) swabs in vial transport medium  Result Value Ref Range Status   SARS Coronavirus 2 by RT PCR NEGATIVE NEGATIVE Final    Comment: (NOTE) SARS-CoV-2 target nucleic acids are NOT DETECTED.  The SARS-CoV-2 RNA is generally detectable in upper respiratory specimens during the acute phase of infection. The lowest concentration of SARS-CoV-2 viral copies this assay can detect is 138 copies/mL. A negative result does not preclude SARS-Cov-2 infection and should not be used as the sole basis for treatment or other patient management decisions. A negative result may occur with  improper specimen collection/handling, submission of specimen other than nasopharyngeal swab, presence of viral mutation(s) within the areas targeted by this assay, and inadequate number of viral copies(<138 copies/mL). A negative result must be combined with clinical observations, patient history, and epidemiological information. The expected result is Negative.  Fact Sheet for Patients:  EntrepreneurPulse.com.au  Fact Sheet for Healthcare Providers:  IncredibleEmployment.be  This test is no t yet approved or cleared by the [place] FDA and  has been authorized for detection and/or diagnosis of SARS-CoV-2 by FDA under an Emergency Use Authorization (EUA). This EUA will remain  in effect (meaning this test can be used) for the duration of the COVID-19 declaration under Section 564(b)(1) of the Act, 21 U.S.C.section 360bbb-3(b)(1), unless the authorization is terminated  or revoked sooner.       Influenza A by PCR NEGATIVE NEGATIVE Final   Influenza B by PCR NEGATIVE NEGATIVE Final    Comment: (NOTE) The Xpert Xpress SARS-CoV-2/FLU/RSV plus assay is intended as an aid in the diagnosis of influenza from Nasopharyngeal swab specimens and should not be used as a sole basis for treatment. Nasal washings and aspirates are unacceptable for Xpert Xpress SARS-CoV-2/FLU/RSV testing.  Fact Sheet for Patients: EntrepreneurPulse.com.au  Fact Sheet for Healthcare Providers: IncredibleEmployment.be  This test is not yet approved or cleared  by the [place] and has been authorized for detection and/or diagnosis of SARS-CoV-2 by FDA under an Emergency Use Authorization (EUA). This EUA will remain in effect (meaning this test can be used) for the duration of the COVID-19 declaration under Section 564(b)(1) of the Act, 21 U.S.C. section 360bbb-3(b)(1), unless the authorization is terminated or revoked.  Performed at [facility], [address], [place], [place] [address]     Radiology Reports DG Chest 2 View  Result Date: 09/15/2020 CLINICAL DATA:  Increasing shortness of breath and wheezing for 1 month. EXAM: CHEST - 2 VIEW COMPARISON:  Single-view of the chest 02/06/2020. FINDINGS: The patient has a small right pleural effusion. There is pulmonary edema. No consolidative process or pneumothorax. Heart size is upper normal. IMPRESSION: Pulmonary edema with a small right pleural effusion. Electronically Signed   By: Inge Rise M.D.   On: 09/15/2020 14:25    ECHOCARDIOGRAM COMPLETE  Result Date: 09/16/2020    ECHOCARDIOGRAM REPORT   Patient Name:   Lauren Osborn Date of Exam: 09/16/2020 Medical Rec #:  [id number]      Height:       63.0 in Accession #:    [id number]     Weight:       358.0 lb Date of Birth:  13-Mar-1976      BSA:          2.477 m Patient Age:    47 years       BP:           113/82 mmHg Patient Gender: F              HR:           89 bpm. Exam Location:  [facility] Procedure: 2D Echo Indications:     Dyspnea R06.00  History:         Patient has prior history of Echocardiogram examinations, most                  recent 03/18/2020. Risk Factors:Former Smoker. Acute Kidney                  Injury, GERD, Prolonged QT interval.  Sonographer:     Leavy Cella RDCS (AE) Referring Phys:  [id number] Tobe Sos ADEFESO Diagnosing Phys: Carlyle Dolly MD IMPRESSIONS  1. Left ventricular ejection fraction, by estimation, is 65 to 70%. The left ventricle has normal function. The left ventricle has no regional wall motion abnormalities. Left ventricular diastolic parameters were normal.  2. RV not well visualized. Grossly appears enlarged. Function appears to be decreased but difficult to quantify. Intermittent septal bounce likely secondary to RV strain, as there is no echocardiographic signs of constrictive pericarditis (normal mitral  inflow, normal MV tissue Doppler). . Right ventricular systolic function was not well visualized. The right ventricular size is not well visualized.  3. The mitral valve is normal in structure. No evidence of mitral valve regurgitation. No evidence of mitral stenosis.  4. The aortic valve was not well visualized. Aortic valve regurgitation is not visualized. No aortic stenosis is present. FINDINGS  Left Ventricle: Left ventricular ejection fraction, by estimation, is 65 to 70%. The left ventricle has normal function. The left ventricle has no regional wall motion abnormalities. Definity contrast agent was given IV to delineate the  left ventricular  endocardial borders. The left ventricular internal cavity size was normal in size. There is no left ventricular hypertrophy. Left ventricular diastolic parameters were normal. Right Ventricle: RV  not well visualized. Grossly appears enlarged. Function appears to be decreased but difficult to quantify. Intermittent septal bounce likely secondary to RV strain, as there is no echocardiographic signs of constrictive pericarditis (normal mitral inflow, normal MV tissue Doppler). The right ventricular size is not well visualized. Right vetricular wall thickness was not well visualized. Right ventricular systolic function was not well visualized. Left Atrium: Left atrial size was normal in size. Right Atrium: Right atrial size was not well visualized. Pericardium: There is no evidence of pericardial effusion. Mitral Valve: The mitral valve is normal in structure. No evidence of mitral valve regurgitation. No evidence of mitral valve stenosis. Tricuspid Valve: The tricuspid valve is not well visualized. Tricuspid valve regurgitation is trivial. No evidence of tricuspid stenosis. Aortic Valve: The aortic valve was not well visualized. Aortic valve regurgitation is not visualized. No aortic stenosis is present. Aortic valve mean gradient measures 3.0 mmHg. Aortic valve peak gradient measures 6.2 mmHg. Aortic valve area, by VTI measures 2.54 cm. Pulmonic Valve: The pulmonic valve was not well visualized. Pulmonic valve regurgitation is not visualized. No evidence of pulmonic stenosis. Aorta: The aortic root is normal in size and structure. Venous: The inferior vena cava was not well visualized. IAS/Shunts: The interatrial septum was not well visualized.  LEFT VENTRICLE PLAX 2D LVIDd:         4.25 cm  Diastology LVIDs:         2.60 cm  LV e' medial:    10.40 cm/s LV PW:         0.90 cm  LV E/e' medial:  10.4 LV IVS:        0.82 cm  LV e' lateral:   11.50 cm/s LVOT diam:     1.80 cm  LV E/e' lateral: 9.4 LV  SV:         53 LV SV Index:   21 LVOT Area:     2.54 cm  RIGHT VENTRICLE TAPSE (M-mode): 1.6 cm LEFT ATRIUM             Index       RIGHT ATRIUM           Index LA diam:        3.50 cm 1.41 cm/m  RA Area:     14.70 cm LA Vol (A2C):   59.4 ml 23.99 ml/m RA Volume:   40.40 ml  16.31 ml/m LA Vol (A4C):   31.9 ml 12.88 ml/m LA Biplane Vol: 44.2 ml 17.85 ml/m  AORTIC VALVE AV Area (Vmax):    2.21 cm AV Area (Vmean):   2.24 cm AV Area (VTI):     2.54 cm AV Vmax:           124.23 cm/s AV Vmean:          79.476 cm/s AV VTI:            0.207 m AV Peak Grad:      6.2 mmHg AV Mean Grad:      3.0 mmHg LVOT Vmax:         107.75 cm/s LVOT Vmean:        69.931 cm/s LVOT VTI:          0.207 m LVOT/AV VTI ratio: 1.00  AORTA Ao Root diam: 2.80 cm MITRAL VALVE                TRICUSPID VALVE MV Area (PHT): 3.76 cm     TR Peak grad:   15.1 mmHg  MV Decel Time: 202 msec     TR Vmax:        194.00 cm/s MV E velocity: 108.00 cm/s MV A velocity: 58.00 cm/s   SHUNTS MV E/A ratio:  1.86         Systemic VTI:  0.21 m                             Systemic Diam: 1.80 cm Carlyle Dolly MD Electronically signed by Carlyle Dolly MD Signature Date/Time: 09/16/2020/11:16:17 AM    Final (Updated)    US Abdomen Limited RUQ (LIVER/GB)  Result Date: 09/08/2020 CLINICAL DATA:  Abnormal LFTs for 6 months, history hypertension EXAM: ULTRASOUND ABDOMEN LIMITED RIGHT UPPER QUADRANT COMPARISON:  None FINDINGS: Gallbladder: Suboptimally assessed due to body habitus. Large shadowing echogenic focus seen within gallbladder 5.3 cm length likely a large gallstone versus less likely multiple tiny calculi. Mildly thickened gallbladder wall. No pericholecystic fluid. No sonographic Murphy sign. Common bile duct: Diameter: 4 mm Liver: Normal parenchymal echogenicity. Hyperechoic mass RIGHT lobe 3.4 x 3.8 x 2.7 cm. No additional masses identified. Portal vein is patent on color Doppler imaging with normal direction of blood flow towards the liver. Other:  No RIGHT upper quadrant free fluid. IMPRESSION: Cholelithiasis with probable large single calculus 5.3 cm diameter versus less likely multiple tiny dependent calculi within gallbladder. No biliary dilatation. Hyperechoic mass RIGHT lobe liver 3.8 cm in size; finding is nonspecific, could potentially represent a hepatic hemangioma but is not diagnostic and other etiologies are not excluded; further characterization by MR imaging with and without contrast recommended. Electronically Signed   By: Lavonia Dana M.D.   On: 09/08/2020 10:23     CBC Recent Labs  Lab 09/15/20 1548 09/16/20 0215  WBC 8.9 7.5  HGB 12.7 12.1  HCT 40.8 38.1  PLT 248 247  MCV 95.1 94.1  MCH 29.6 29.9  MCHC 31.1 31.8  RDW 17.1* 17.2*    Chemistries  Recent Labs  Lab 09/15/20 1548 09/16/20 0215  NA 139 139  K 3.4* 2.9*  CL 103 102  CO2 25 26  GLUCOSE 104* 100*  BUN 20 20  CREATININE 1.48* 1.58*  CALCIUM 9.1 8.9  MG  --  1.8  AST 26 26  ALT 16 14  ALKPHOS 51 49  BILITOT 2.4* 2.4*   ------------------------------------------------------------------------------------------------------------------ No results for input(s): CHOL, HDL, LDLCALC, TRIG, CHOLHDL, LDLDIRECT in the last 72 hours.  No results found for: HGBA1C ------------------------------------------------------------------------------------------------------------------ No results for input(s): TSH, T4TOTAL, T3FREE, THYROIDAB in the last 72 hours.  Invalid input(s): FREET3 ------------------------------------------------------------------------------------------------------------------ No results for input(s): VITAMINB12, FOLATE, FERRITIN, TIBC, IRON, RETICCTPCT in the last 72 hours.  Coagulation profile Recent Labs  Lab 09/16/20 0215  INR 1.3*    No results for input(s): DDIMER in the last 72 hours.  Cardiac Enzymes No results for input(s): CKMB, TROPONINI, MYOGLOBIN in the last 168 hours.  Invalid input(s):  CK ------------------------------------------------------------------------------------------------------------------    Component Value Date/Time   BNP 318.0 (H) 09/15/2020 1548     Roxan Hockey M.D on 09/16/2020 at 5:57 PM  Go to [email or web address] - for contact info  [facility] - Office  [phone]

## 2020-09-16 NOTE — ED Notes (Signed)
Pt updated on negative covid results, visitor at bedside. Warm blankets provided. purewick functioning without complication. Denies further needs at this time. Call bell and personal items within reach.

## 2020-09-16 NOTE — ED Notes (Signed)
Report called to [phone] 

## 2020-09-16 NOTE — ED Notes (Signed)
Pt resting quietly in bed with eyes closed, NAD noted. Will continue to monitor.

## 2020-09-16 NOTE — ED Notes (Signed)
Bloodwork collected and walked to lab. Medicated per MAR. VSS. NAD noted.. 849mL clear yellow urine output. Water provided. Daughter at bedside. Denies further needs at this time.

## 2020-09-17 ENCOUNTER — Inpatient Hospital Stay (HOSPITAL_COMMUNITY): Payer: Managed Care, Other (non HMO)

## 2020-09-17 DIAGNOSIS — I5033 Acute on chronic diastolic (congestive) heart failure: Secondary | ICD-10-CM | POA: Diagnosis not present

## 2020-09-17 DIAGNOSIS — R0602 Shortness of breath: Secondary | ICD-10-CM | POA: Diagnosis not present

## 2020-09-17 LAB — RENAL FUNCTION PANEL
Albumin: 4.1 g/dL (ref 3.5–5.0)
Anion gap: 12 (ref 5–15)
BUN: 19 mg/dL (ref 6–20)
CO2: 27 mmol/L (ref 22–32)
Calcium: 9.2 mg/dL (ref 8.9–10.3)
Chloride: 102 mmol/L (ref 98–111)
Creatinine, Ser: 1.46 mg/dL — ABNORMAL HIGH (ref 0.44–1.00)
GFR, Estimated: 45 mL/min — ABNORMAL LOW (ref >60)
Glucose, Bld: 100 mg/dL — ABNORMAL HIGH (ref 70–99)
Phosphorus: 4.5 mg/dL (ref 2.5–4.6)
Potassium: 2.7 mmol/L — CL (ref 3.5–5.1)
Sodium: 141 mmol/L (ref 135–145)

## 2020-09-17 LAB — TSH: TSH: 4.037 u[IU]/mL (ref 0.350–4.500)

## 2020-09-17 IMAGING — DX DG CHEST 2V
2 series · 2 of 2 positions shown · non-contrast
Comparison: [DATE]

CLINICAL DATA: Shortness of breath, fluid retention

EXAM:
CHEST - 2 VIEW

[chest pa]
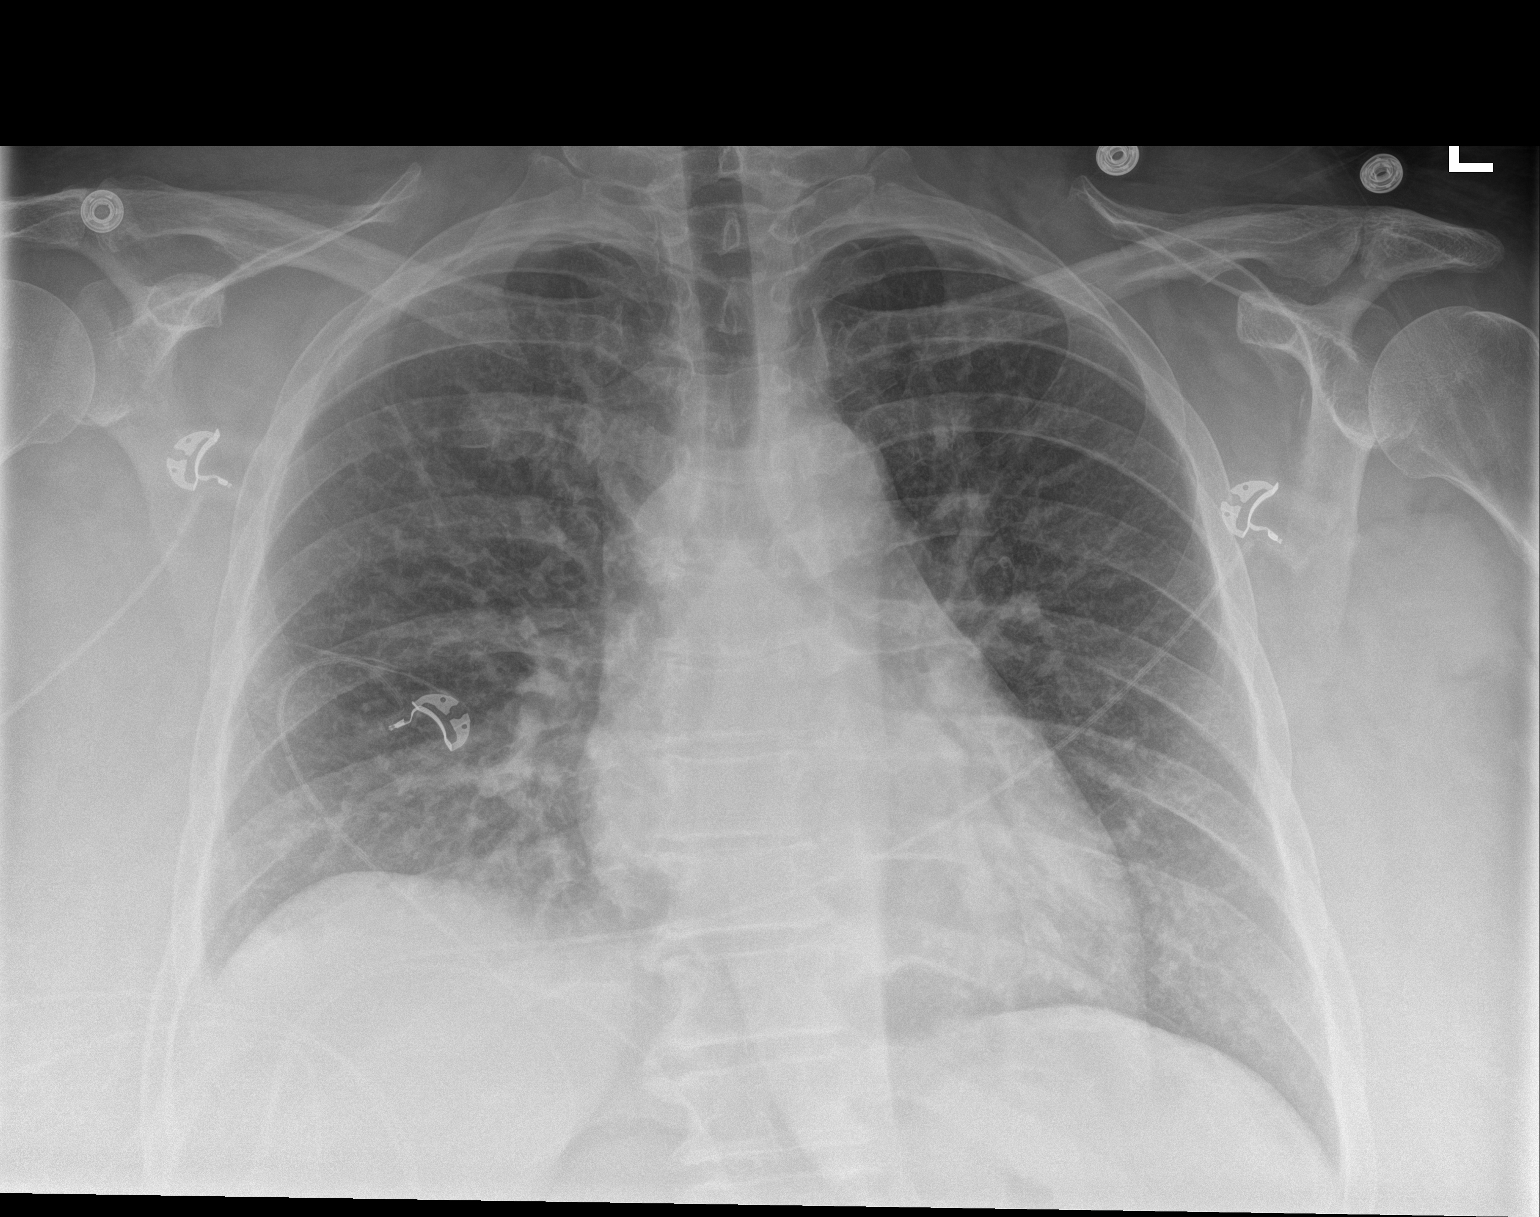

[chest lat]
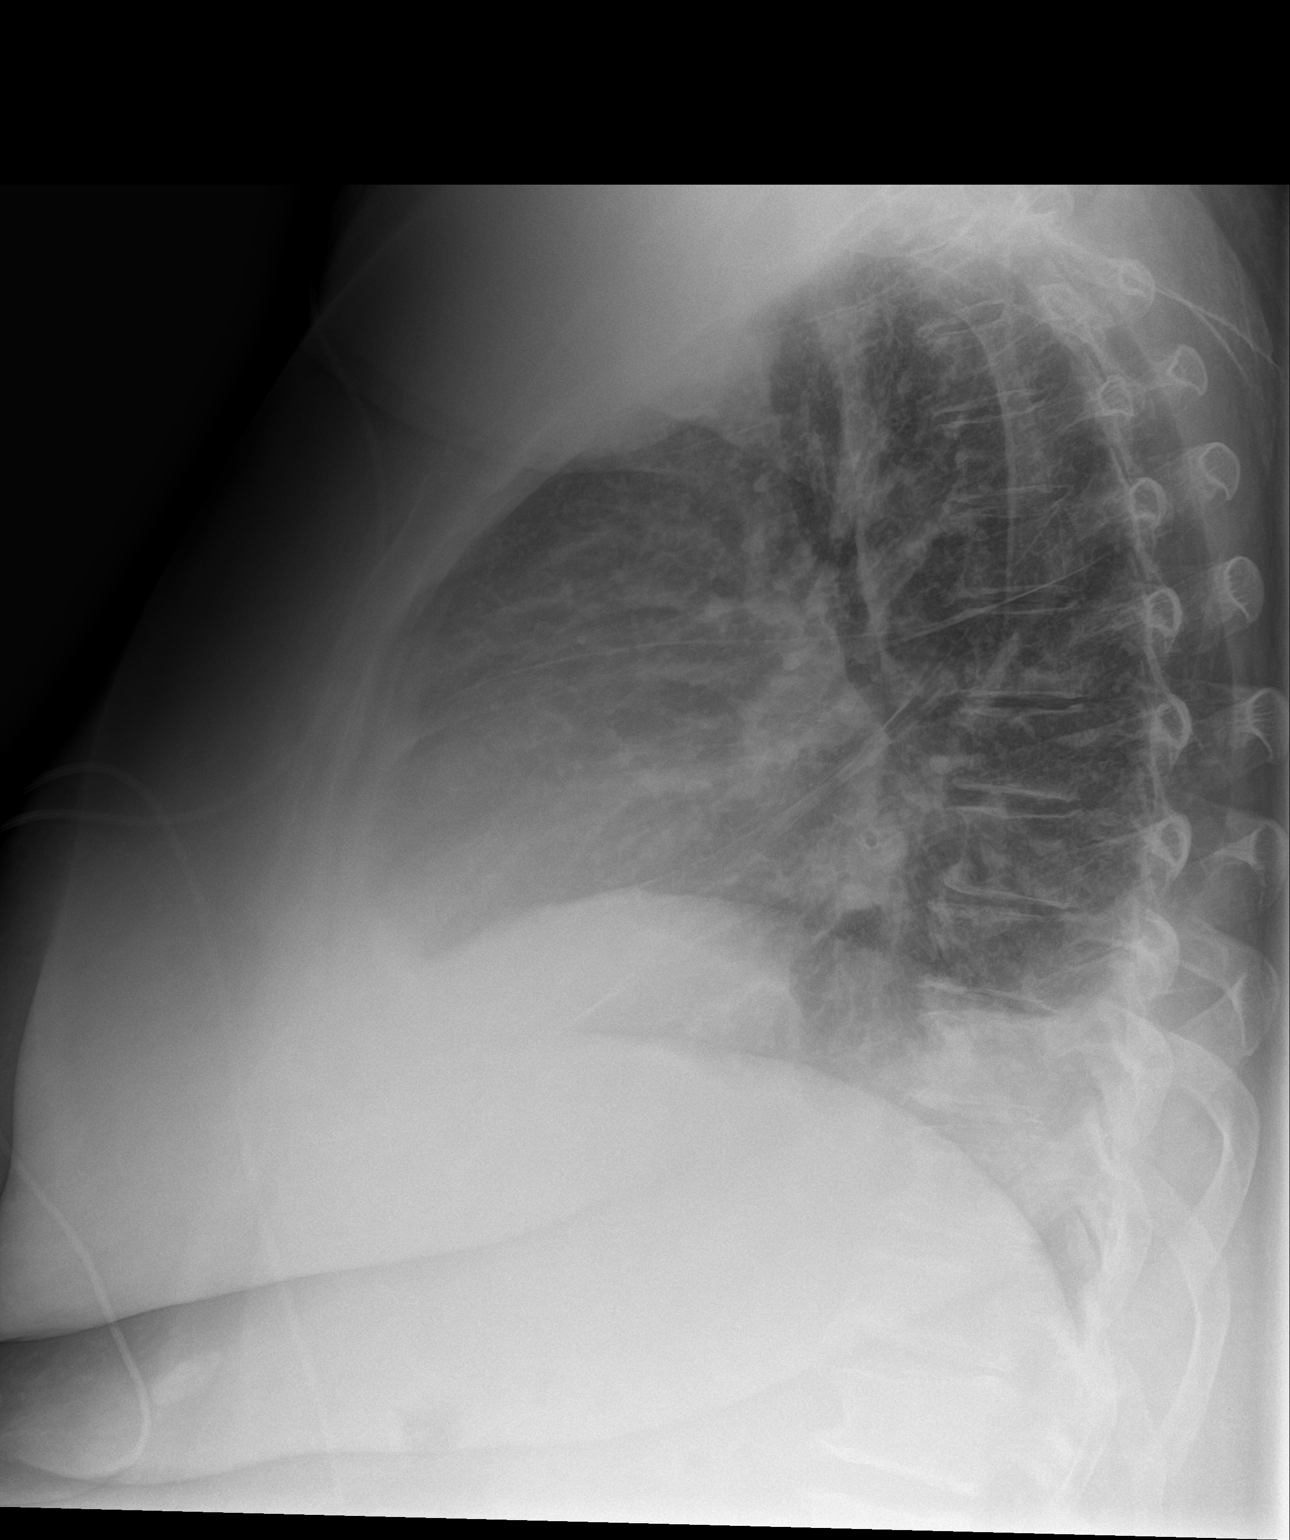

[2 of 2 positions shown; findings below may reference images not displayed]

FINDINGS: No significant change in chest radiographs with mild, diffuse
interstitial opacity and a small right pleural effusion. No new or
focal airspace opacity. The heart and mediastinum are unremarkable.
Mild disc degenerative disease of the thoracic spine.
IMPRESSION: No significant change in chest radiographs with mild, diffuse
interstitial opacity and a small right pleural effusion, likely
edema. No new or focal airspace opacity.

## 2020-09-17 MED ORDER — POTASSIUM CHLORIDE CRYS ER 20 MEQ PO TBCR: 40.0000 meq | EXTENDED_RELEASE_TABLET | ORAL | Status: DC

## 2020-09-17 MED ORDER — POTASSIUM CHLORIDE CRYS ER 20 MEQ PO TBCR
40.0000 meq | EXTENDED_RELEASE_TABLET | Freq: Three times a day (TID) | ORAL | Status: AC
  Administered 2020-09-17 (×3): 40 meq via ORAL
  Filled 2020-09-17 (×4): qty 2

## 2020-09-17 MED ORDER — POTASSIUM CHLORIDE CRYS ER 20 MEQ PO TBCR
40.0000 meq | EXTENDED_RELEASE_TABLET | Freq: Once | ORAL | Status: AC
  Administered 2020-09-17: 40 meq via ORAL
  Filled 2020-09-17: qty 2

## 2020-09-17 MED ORDER — FUROSEMIDE 10 MG/ML IJ SOLN
60.0000 mg | Freq: Two times a day (BID) | INTRAMUSCULAR | Status: DC
  Administered 2020-09-17 – 2020-09-20 (×7): 60 mg via INTRAVENOUS
  Filled 2020-09-17 (×7): qty 6

## 2020-09-17 NOTE — Progress Notes (Signed)
Patient Demographics:    Lauren Osborn, is a 45 y.o. female, DOB - [birth year], XKG:[id number]  Admit date - 09/15/2020   Admitting Physician Bernadette Hoit, DO  Outpatient Primary MD for the patient is Lauren Fire, MD  LOS - 1   Chief Complaint  Patient presents with  . Shortness of Breath        Subjective:    Lauren Osborn today has no fevers, no emesis,  No chest pain,   Continues to experience shortness of breath and , Daughter at bedside, questions answered Patient admits to-Excessive Pepsi/soda intake--- she appears to get at least 1500 cal from just soda per day  Assessment  & Plan :    Active Problems:   Pulmonary edema   Hypokalemia   Prolonged QT interval   Acute kidney injury (HCC)   Elevated brain natriuretic peptide (BNP) level   GERD (gastroesophageal reflux disease)   Anasarca   Super obese  Brief Summary:- 45 y.o. female with medical history significant for hypertension and chronic lower extremity edema, admitted on 09/15/20 with progressive dyspnea on exertion, abdominal distension and lower extremity edema since 06/2020.  Compliance with oral diuretics--- on admission weight up to 358 pounds from recent wt around 280 lb (a few months ago her weight was 351lb)  A/p 1)HFpEF--- patient admitted with acute on chronic diastolic dysfunction CHF, repeat echo with preserved EF around 70% -BNP 318 but this may be underestimated in obese patients with diastolic dysfunction -Chest x-ray and clinical exam supports overload status/pulmonary edema -Failed oral diuretics,  , daily weights, fluid input and output charting -Weight appears to be down to 345 pounds from 358 pounds -Increase IV Lasix to 60 twice daily -  2)AKI----acute kidney injury--- due to aggressive diuresis -GFR is greater than 60 and creatinine was 1.0 on 08/13/2020 -Creatinine is down to 1.46 from 1.58 on  admission -monitor closely with diuresis, if patient does not tolerate bolus Lasix doses from the renal standpoint-Lasix drip may be better tolerated  renally adjust medications, avoid nephrotoxic agents / dehydration  / hypotension  3)Hypokalemia--- magnesium WNL, replace and monitor closely with aggressive diuresis  4)Morbid Obesity- -Low calorie diet, portion control and increase physical activity discussed with patient -Body mass index is 61.2 kg/m.  -Patient admits to-Excessive Pepsi/soda intake--- she appears to get at least 1500 cal from just soda per day  5)H/o Prior partial thyroidectomy--- TSH WNL  Disposition/Need for in-Hospital Stay- patient unable to be discharged at this time due to --- very significant volume overload/pulmonary edema, failed oral diuretics needs IV diuretics and monitoring of electrolytes and renal function while aggressively diuresing  Status is: Inpatient  Remains inpatient appropriate because:Please see below   Disposition: The patient is from: Home              Anticipated d/c is to: Home              Anticipated d/c date is: > 3 days              Patient currently is not medically stable to d/c. Barriers: Not Clinically Stable-   Code Status :  -  Code Status: Full Code   Family Communication:    NA (patient is alert, awake and  coherent)   Consults  :  card  DVT Prophylaxis  :   - SCDs   heparin injection 5,000 Units Start: 09/16/20 1400 SCDs Start: 09/16/20 0137    Lab Results  Component Value Date   PLT 247 09/16/2020    Inpatient Medications  Scheduled Meds: . furosemide  60 mg Intravenous BID  . heparin injection (subcutaneous)  5,000 Units Subcutaneous Q8H  . pantoprazole  40 mg Oral Daily  . potassium chloride  40 mEq Oral TID   Continuous Infusions: PRN Meds:.    Anti-infectives (From admission, onward)   None        Objective:   Vitals:   09/17/20 0201 09/17/20 0450 09/17/20 0831 09/17/20 1308  BP: 125/74  105/66  118/76  Pulse: (!) 102 91  92  Resp: 19 19  20   Temp: 97.7 F (36.5 C) 98.2 F (36.8 C)  97.6 F (36.4 C)  TempSrc: Oral   Oral  SpO2: 99% 99%  100%  Weight:   (!) 156.7 kg   Height:        Wt Readings from Last 3 Encounters:  09/17/20 (!) 156.7 kg  09/09/20 (!) 159.2 kg  08/13/20 129.7 kg     Intake/Output Summary (Last 24 hours) at 09/17/2020 1948 Last data filed at 09/17/2020 1831 Gross per 24 hour  Intake 720 ml  Output 5150 ml  Net -4430 ml     Physical Exam  Gen:- Awake Alert, morbidly obese, HEENT:- [place].AT, No sclera icterus Neck-Supple Neck, Lungs-diminished in bases, faint bibasilar rales CV- S1, S2 normal, regular  Abd-  +ve B.Sounds, Abd Soft, No tenderness, increased truncal adiposity    Extremity/Skin:- 3+  edema, pedal pulses present  Psych-affect is appropriate, oriented x3 Neuro-generalized weakness no new focal deficits, no tremors   Data Review:   Micro Results Recent Results (from the past 240 hour(s))  Resp Panel by RT-PCR (Flu A&B, Covid) Nasopharyngeal Swab     Status: None   Collection Time: 09/15/20 11:01 PM   Specimen: Nasopharyngeal Swab; Nasopharyngeal(NP) swabs in vial transport medium  Result Value Ref Range Status   SARS Coronavirus 2 by RT PCR NEGATIVE NEGATIVE Final    Comment: (NOTE) SARS-CoV-2 target nucleic acids are NOT DETECTED.  The SARS-CoV-2 RNA is generally detectable in upper respiratory specimens during the acute phase of infection. The lowest concentration of SARS-CoV-2 viral copies this assay can detect is 138 copies/mL. A negative result does not preclude SARS-Cov-2 infection and should not be used as the sole basis for treatment or other patient management decisions. A negative result may occur with  improper specimen collection/handling, submission of specimen other than nasopharyngeal swab, presence of viral mutation(s) within the areas targeted by this assay, and inadequate number of viral copies(<138  copies/mL). A negative result must be combined with clinical observations, patient history, and epidemiological information. The expected result is Negative.  Fact Sheet for Patients:  EntrepreneurPulse.com.au  Fact Sheet for Healthcare Providers:  IncredibleEmployment.be  This test is no t yet approved or cleared by the [place] FDA and  has been authorized for detection and/or diagnosis of SARS-CoV-2 by FDA under an Emergency Use Authorization (EUA). This EUA will remain  in effect (meaning this test can be used) for the duration of the COVID-19 declaration under Section 564(b)(1) of the Act, 21 U.S.C.section 360bbb-3(b)(1), unless the authorization is terminated  or revoked sooner.       Influenza A by PCR NEGATIVE NEGATIVE Final   Influenza B by  PCR NEGATIVE NEGATIVE Final    Comment: (NOTE) The Xpert Xpress SARS-CoV-2/FLU/RSV plus assay is intended as an aid in the diagnosis of influenza from Nasopharyngeal swab specimens and should not be used as a sole basis for treatment. Nasal washings and aspirates are unacceptable for Xpert Xpress SARS-CoV-2/FLU/RSV testing.  Fact Sheet for Patients: EntrepreneurPulse.com.au  Fact Sheet for Healthcare Providers: IncredibleEmployment.be  This test is not yet approved or cleared by the [place] FDA and has been authorized for detection and/or diagnosis of SARS-CoV-2 by FDA under an Emergency Use Authorization (EUA). This EUA will remain in effect (meaning this test can be used) for the duration of the COVID-19 declaration under Section 564(b)(1) of the Act, 21 U.S.C. section 360bbb-3(b)(1), unless the authorization is terminated or revoked.  Performed at [facility], [address] [place], [place] [address]     Radiology Reports DG Chest 2 View  Result Date: 09/17/2020 CLINICAL DATA:  Shortness of breath, fluid retention EXAM: CHEST - 2 VIEW  COMPARISON:  09/15/2020 FINDINGS: No significant change in chest radiographs with mild, diffuse interstitial opacity and a small right pleural effusion. No new or focal airspace opacity. The heart and mediastinum are unremarkable. Mild disc degenerative disease of the thoracic spine. IMPRESSION: No significant change in chest radiographs with mild, diffuse interstitial opacity and a small right pleural effusion, likely edema. No new or focal airspace opacity. Electronically Signed   By: Eddie Candle M.D.   On: 09/17/2020 09:00   DG Chest 2 View  Result Date: 09/15/2020 CLINICAL DATA:  Increasing shortness of breath and wheezing for 1 month. EXAM: CHEST - 2 VIEW COMPARISON:  Single-view of the chest 02/06/2020. FINDINGS: The patient has a small right pleural effusion. There is pulmonary edema. No consolidative process or pneumothorax. Heart size is upper normal. IMPRESSION: Pulmonary edema with a small right pleural effusion. Electronically Signed   By: Inge Rise M.D.   On: 09/15/2020 14:25   ECHOCARDIOGRAM COMPLETE  Result Date: 09/16/2020    ECHOCARDIOGRAM REPORT   Patient Name:   PAITLYN MCCLATCHEY Date of Exam: 09/16/2020 Medical Rec #:  [id number]      Height:       63.0 in Accession #:    [id number]     Weight:       358.0 lb Date of Birth:  [birth year]      BSA:          2.477 m Patient Age:    87 years       BP:           113/82 mmHg Patient Gender: F              HR:           89 bpm. Exam Location:  [facility] Procedure: 2D Echo Indications:     Dyspnea R06.00  History:         Patient has prior history of Echocardiogram examinations, most                  recent 03/18/2020. Risk Factors:Former Smoker. Acute Kidney                  Injury, GERD, Prolonged QT interval.  Sonographer:     Leavy Cella RDCS (AE) Referring Phys:  [id number] Tobe Sos ADEFESO Diagnosing Phys: Carlyle Dolly MD IMPRESSIONS  1. Left ventricular ejection fraction, by estimation, is 65 to 70%. The left ventricle has normal  function. The left ventricle has no regional  wall motion abnormalities. Left ventricular diastolic parameters were normal.  2. RV not well visualized. Grossly appears enlarged. Function appears to be decreased but difficult to quantify. Intermittent septal bounce likely secondary to RV strain, as there is no echocardiographic signs of constrictive pericarditis (normal mitral  inflow, normal MV tissue Doppler). . Right ventricular systolic function was not well visualized. The right ventricular size is not well visualized.  3. The mitral valve is normal in structure. No evidence of mitral valve regurgitation. No evidence of mitral stenosis.  4. The aortic valve was not well visualized. Aortic valve regurgitation is not visualized. No aortic stenosis is present. FINDINGS  Left Ventricle: Left ventricular ejection fraction, by estimation, is 65 to 70%. The left ventricle has normal function. The left ventricle has no regional wall motion abnormalities. Definity contrast agent was given IV to delineate the left ventricular  endocardial borders. The left ventricular internal cavity size was normal in size. There is no left ventricular hypertrophy. Left ventricular diastolic parameters were normal. Right Ventricle: RV not well visualized. Grossly appears enlarged. Function appears to be decreased but difficult to quantify. Intermittent septal bounce likely secondary to RV strain, as there is no echocardiographic signs of constrictive pericarditis (normal mitral inflow, normal MV tissue Doppler). The right ventricular size is not well visualized. Right vetricular wall thickness was not well visualized. Right ventricular systolic function was not well visualized. Left Atrium: Left atrial size was normal in size. Right Atrium: Right atrial size was not well visualized. Pericardium: There is no evidence of pericardial effusion. Mitral Valve: The mitral valve is normal in structure. No evidence of mitral valve regurgitation.  No evidence of mitral valve stenosis. Tricuspid Valve: The tricuspid valve is not well visualized. Tricuspid valve regurgitation is trivial. No evidence of tricuspid stenosis. Aortic Valve: The aortic valve was not well visualized. Aortic valve regurgitation is not visualized. No aortic stenosis is present. Aortic valve mean gradient measures 3.0 mmHg. Aortic valve peak gradient measures 6.2 mmHg. Aortic valve area, by VTI measures 2.54 cm. Pulmonic Valve: The pulmonic valve was not well visualized. Pulmonic valve regurgitation is not visualized. No evidence of pulmonic stenosis. Aorta: The aortic root is normal in size and structure. Venous: The inferior vena cava was not well visualized. IAS/Shunts: The interatrial septum was not well visualized.  LEFT VENTRICLE PLAX 2D LVIDd:         4.25 cm  Diastology LVIDs:         2.60 cm  LV e' medial:    10.40 cm/s LV PW:         0.90 cm  LV E/e' medial:  10.4 LV IVS:        0.82 cm  LV e' lateral:   11.50 cm/s LVOT diam:     1.80 cm  LV E/e' lateral: 9.4 LV SV:         53 LV SV Index:   21 LVOT Area:     2.54 cm  RIGHT VENTRICLE TAPSE (M-mode): 1.6 cm LEFT ATRIUM             Index       RIGHT ATRIUM           Index LA diam:        3.50 cm 1.41 cm/m  RA Area:     14.70 cm LA Vol (A2C):   59.4 ml 23.99 ml/m RA Volume:   40.40 ml  16.31 ml/m LA Vol (A4C):   31.9 ml 12.88 ml/m LA  Biplane Vol: 44.2 ml 17.85 ml/m  AORTIC VALVE AV Area (Vmax):    2.21 cm AV Area (Vmean):   2.24 cm AV Area (VTI):     2.54 cm AV Vmax:           124.23 cm/s AV Vmean:          79.476 cm/s AV VTI:            0.207 m AV Peak Grad:      6.2 mmHg AV Mean Grad:      3.0 mmHg LVOT Vmax:         107.75 cm/s LVOT Vmean:        69.931 cm/s LVOT VTI:          0.207 m LVOT/AV VTI ratio: 1.00  AORTA Ao Root diam: 2.80 cm MITRAL VALVE                TRICUSPID VALVE MV Area (PHT): 3.76 cm     TR Peak grad:   15.1 mmHg MV Decel Time: 202 msec     TR Vmax:        194.00 cm/s MV E velocity: 108.00 cm/s MV  A velocity: 58.00 cm/s   SHUNTS MV E/A ratio:  1.86         Systemic VTI:  0.21 m                             Systemic Diam: 1.80 cm Carlyle Dolly MD Electronically signed by Carlyle Dolly MD Signature Date/Time: 09/16/2020/11:16:17 AM    Final (Updated)    US Abdomen Limited RUQ (LIVER/GB)  Result Date: 09/08/2020 CLINICAL DATA:  Abnormal LFTs for 6 months, history hypertension EXAM: ULTRASOUND ABDOMEN LIMITED RIGHT UPPER QUADRANT COMPARISON:  None FINDINGS: Gallbladder: Suboptimally assessed due to body habitus. Large shadowing echogenic focus seen within gallbladder 5.3 cm length likely a large gallstone versus less likely multiple tiny calculi. Mildly thickened gallbladder wall. No pericholecystic fluid. No sonographic Murphy sign. Common bile duct: Diameter: 4 mm Liver: Normal parenchymal echogenicity. Hyperechoic mass RIGHT lobe 3.4 x 3.8 x 2.7 cm. No additional masses identified. Portal vein is patent on color Doppler imaging with normal direction of blood flow towards the liver. Other: No RIGHT upper quadrant free fluid. IMPRESSION: Cholelithiasis with probable large single calculus 5.3 cm diameter versus less likely multiple tiny dependent calculi within gallbladder. No biliary dilatation. Hyperechoic mass RIGHT lobe liver 3.8 cm in size; finding is nonspecific, could potentially represent a hepatic hemangioma but is not diagnostic and other etiologies are not excluded; further characterization by MR imaging with and without contrast recommended. Electronically Signed   By: Lavonia Dana M.D.   On: 09/08/2020 10:23     CBC Recent Labs  Lab 09/15/20 1548 09/16/20 0215  WBC 8.9 7.5  HGB 12.7 12.1  HCT 40.8 38.1  PLT 248 247  MCV 95.1 94.1  MCH 29.6 29.9  MCHC 31.1 31.8  RDW 17.1* 17.2*    Chemistries  Recent Labs  Lab 09/15/20 1548 09/16/20 0215 09/17/20 0612  NA 139 139 141  K 3.4* 2.9* 2.7*  CL 103 102 102  CO2 25 26 27   GLUCOSE 104* 100* 100*  BUN 20 20 19   CREATININE  1.48* 1.58* 1.46*  CALCIUM 9.1 8.9 9.2  MG  --  1.8  --   AST 26 26  --   ALT 16 14  --   ALKPHOS 51 49  --  BILITOT 2.4* 2.4*  --    ------------------------------------------------------------------------------------------------------------------ No results for input(s): CHOL, HDL, LDLCALC, TRIG, CHOLHDL, LDLDIRECT in the last 72 hours.  No results found for: HGBA1C ------------------------------------------------------------------------------------------------------------------ Recent Labs    09/17/20 0612  TSH 4.037   ------------------------------------------------------------------------------------------------------------------ No results for input(s): VITAMINB12, FOLATE, FERRITIN, TIBC, IRON, RETICCTPCT in the last 72 hours.  Coagulation profile Recent Labs  Lab 09/16/20 0215  INR 1.3*    No results for input(s): DDIMER in the last 72 hours.  Cardiac Enzymes No results for input(s): CKMB, TROPONINI, MYOGLOBIN in the last 168 hours.  Invalid input(s): CK ------------------------------------------------------------------------------------------------------------------    Component Value Date/Time   BNP 318.0 (H) 09/15/2020 1548     Roxan Hockey M.D on 09/17/2020 at 7:48 PM  Go to [email or web address] - for contact info  [facility] - Office  [phone]

## 2020-09-17 NOTE — Progress Notes (Signed)
Progress Note  Patient Name: Lauren Osborn Date of Encounter: 09/17/2020  [facility] Cardiologist: Rozann Lesches, MD   Subjective   Some ongoing SOB  Inpatient Medications    Scheduled Meds: . furosemide  40 mg Intravenous Q12H  . heparin injection (subcutaneous)  5,000 Units Subcutaneous Q8H  . pantoprazole  40 mg Oral Daily  . potassium chloride  40 mEq Oral Q3H   Continuous Infusions:  PRN Meds:    Vital Signs    Vitals:   09/16/20 2232 09/17/20 0201 09/17/20 0450 09/17/20 0831  BP: 115/81 125/74 105/66   Pulse: (!) 101 (!) 102 91   Resp: 19 19 19    Temp: 98.2 F (36.8 C) 97.7 F (36.5 C) 98.2 F (36.8 C)   TempSrc: Oral Oral    SpO2: 99% 99% 99%   Weight:    (!) 156.7 kg  Height:        Intake/Output Summary (Last 24 hours) at 09/17/2020 0836 Last data filed at 09/17/2020 4081 Gross per 24 hour  Intake --  Output 3900 ml  Net -3900 ml   Last 3 Weights 09/17/2020 09/16/2020 09/15/2020  Weight (lbs) 345 lb 8 oz 352 lb 4.7 oz 358 lb  Weight (kg) 156.718 kg 159.8 kg 162.388 kg      Telemetry    SR - Personally Reviewed  ECG    n/a - Personally Reviewed  Physical Exam   GEN: No acute distress.   Neck: No JVD Cardiac: RRR, no murmurs, rubs, or gallops.  Respiratory: Clear to auscultation bilaterally. GI: Soft, nontender, non-distended  MS: No edema; No deformity. Neuro:  Nonfocal  Psych: Normal affect   Labs    High Sensitivity Troponin:  No results for input(s): TROPONINIHS in the last 720 hours.    Chemistry Recent Labs  Lab 09/15/20 1548 09/16/20 0215 09/17/20 0612  NA 139 139 141  K 3.4* 2.9* 2.7*  CL 103 102 102  CO2 25 26 27   GLUCOSE 104* 100* 100*  BUN 20 20 19   CREATININE 1.48* 1.58* 1.46*  CALCIUM 9.1 8.9 9.2  PROT 7.0 6.7  --   ALBUMIN 4.2 4.0 4.1  AST 26 26  --   ALT 16 14  --   ALKPHOS 51 49  --   BILITOT 2.4* 2.4*  --   GFRNONAA 44* 41* 45*  ANIONGAP 11 11 12      Hematology Recent Labs  Lab  09/15/20 1548 09/16/20 0215  WBC 8.9 7.5  RBC 4.29 4.05  HGB 12.7 12.1  HCT 40.8 38.1  MCV 95.1 94.1  MCH 29.6 29.9  MCHC 31.1 31.8  RDW 17.1* 17.2*  PLT 248 247    BNP Recent Labs  Lab 09/15/20 1548  BNP 318.0*     DDimer No results for input(s): DDIMER in the last 168 hours.   Radiology    DG Chest 2 View  Result Date: 09/15/2020 CLINICAL DATA:  Increasing shortness of breath and wheezing for 1 month. EXAM: CHEST - 2 VIEW COMPARISON:  Single-view of the chest 02/06/2020. FINDINGS: The patient has a small right pleural effusion. There is pulmonary edema. No consolidative process or pneumothorax. Heart size is upper normal. IMPRESSION: Pulmonary edema with a small right pleural effusion. Electronically Signed   By: Inge Rise M.D.   On: 09/15/2020 14:25   ECHOCARDIOGRAM COMPLETE  Result Date: 09/16/2020    ECHOCARDIOGRAM REPORT   Patient Name:   Lauren Osborn Date of Exam: 09/16/2020 Medical Rec #:  [id number]      Height:       63.0 in Accession #:    [id number]     Weight:       358.0 lb Date of Birth:  30-Jan-1976      BSA:          2.477 m Patient Age:    45 years       BP:           113/82 mmHg Patient Gender: F              HR:           89 bpm. Exam Location:  [facility] Procedure: 2D Echo Indications:     Dyspnea R06.00  History:         Patient has prior history of Echocardiogram examinations, most                  recent 03/18/2020. Risk Factors:Former Smoker. Acute Kidney                  Injury, GERD, Prolonged QT interval.  Sonographer:     Leavy Cella RDCS (AE) Referring Phys:  [id number] Tobe Sos ADEFESO Diagnosing Phys: Carlyle Dolly MD IMPRESSIONS  1. Left ventricular ejection fraction, by estimation, is 65 to 70%. The left ventricle has normal function. The left ventricle has no regional wall motion abnormalities. Left ventricular diastolic parameters were normal.  2. RV not well visualized. Grossly appears enlarged. Function appears to be decreased but difficult  to quantify. Intermittent septal bounce likely secondary to RV strain, as there is no echocardiographic signs of constrictive pericarditis (normal mitral  inflow, normal MV tissue Doppler). . Right ventricular systolic function was not well visualized. The right ventricular size is not well visualized.  3. The mitral valve is normal in structure. No evidence of mitral valve regurgitation. No evidence of mitral stenosis.  4. The aortic valve was not well visualized. Aortic valve regurgitation is not visualized. No aortic stenosis is present. FINDINGS  Left Ventricle: Left ventricular ejection fraction, by estimation, is 65 to 70%. The left ventricle has normal function. The left ventricle has no regional wall motion abnormalities. Definity contrast agent was given IV to delineate the left ventricular  endocardial borders. The left ventricular internal cavity size was normal in size. There is no left ventricular hypertrophy. Left ventricular diastolic parameters were normal. Right Ventricle: RV not well visualized. Grossly appears enlarged. Function appears to be decreased but difficult to quantify. Intermittent septal bounce likely secondary to RV strain, as there is no echocardiographic signs of constrictive pericarditis (normal mitral inflow, normal MV tissue Doppler). The right ventricular size is not well visualized. Right vetricular wall thickness was not well visualized. Right ventricular systolic function was not well visualized. Left Atrium: Left atrial size was normal in size. Right Atrium: Right atrial size was not well visualized. Pericardium: There is no evidence of pericardial effusion. Mitral Valve: The mitral valve is normal in structure. No evidence of mitral valve regurgitation. No evidence of mitral valve stenosis. Tricuspid Valve: The tricuspid valve is not well visualized. Tricuspid valve regurgitation is trivial. No evidence of tricuspid stenosis. Aortic Valve: The aortic valve was not well  visualized. Aortic valve regurgitation is not visualized. No aortic stenosis is present. Aortic valve mean gradient measures 3.0 mmHg. Aortic valve peak gradient measures 6.2 mmHg. Aortic valve area, by VTI measures 2.54 cm. Pulmonic Valve: The pulmonic valve was not well visualized. Pulmonic valve regurgitation is not  visualized. No evidence of pulmonic stenosis. Aorta: The aortic root is normal in size and structure. Venous: The inferior vena cava was not well visualized. IAS/Shunts: The interatrial septum was not well visualized.  LEFT VENTRICLE PLAX 2D LVIDd:         4.25 cm  Diastology LVIDs:         2.60 cm  LV e' medial:    10.40 cm/s LV PW:         0.90 cm  LV E/e' medial:  10.4 LV IVS:        0.82 cm  LV e' lateral:   11.50 cm/s LVOT diam:     1.80 cm  LV E/e' lateral: 9.4 LV SV:         53 LV SV Index:   21 LVOT Area:     2.54 cm  RIGHT VENTRICLE TAPSE (M-mode): 1.6 cm LEFT ATRIUM             Index       RIGHT ATRIUM           Index LA diam:        3.50 cm 1.41 cm/m  RA Area:     14.70 cm LA Vol (A2C):   59.4 ml 23.99 ml/m RA Volume:   40.40 ml  16.31 ml/m LA Vol (A4C):   31.9 ml 12.88 ml/m LA Biplane Vol: 44.2 ml 17.85 ml/m  AORTIC VALVE AV Area (Vmax):    2.21 cm AV Area (Vmean):   2.24 cm AV Area (VTI):     2.54 cm AV Vmax:           124.23 cm/s AV Vmean:          79.476 cm/s AV VTI:            0.207 m AV Peak Grad:      6.2 mmHg AV Mean Grad:      3.0 mmHg LVOT Vmax:         107.75 cm/s LVOT Vmean:        69.931 cm/s LVOT VTI:          0.207 m LVOT/AV VTI ratio: 1.00  AORTA Ao Root diam: 2.80 cm MITRAL VALVE                TRICUSPID VALVE MV Area (PHT): 3.76 cm     TR Peak grad:   15.1 mmHg MV Decel Time: 202 msec     TR Vmax:        194.00 cm/s MV E velocity: 108.00 cm/s MV A velocity: 58.00 cm/s   SHUNTS MV E/A ratio:  1.86         Systemic VTI:  0.21 m                             Systemic Diam: 1.80 cm Carlyle Dolly MD Electronically signed by Carlyle Dolly MD Signature Date/Time:  09/16/2020/11:16:17 AM    Final (Updated)     Cardiac Studies    Patient Profile  Patient seen and discussed with PA Ahmed Prima, I agree with her documentation. 45 yo female history of chronic diastolic HF, HTN, severe obesity BMI 63, presents with severe edema, abdominal distension, SOB refractory to home oral diuretics. From notes 70 lbs weight gain since December  Assessment & Plan    1. 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  On admit 356 lbs. Presents severely volume overloaded with anasarca - CXR pulm edema, right pleural effusion. BNP 318   - repeat echo shows LVEF 65-70%. Diastolic parameters are normal, RV is poorly visualized but appears to be enlarged with decreased function. Inadequate TR to measure PASP. There is intermittent septal bounce likely from RV dysfunction as her mitral inflow and tissue Dopplers are not consistent with constrictive pericarditis - overall I suspect primary issue is RV dysfunction, certaintly risk factors given severe obesity for possible OHS/OSA. She reports has never had sleep study or PFTs, also former smoker x 20 years.   - early I/Os data that is incomplete, documented 4L uop. She received IV lasix 40mg  x 2 yesterday and metolazone 5mg , overall stable renal function. Would look to diurese 2-3 liters per day net negative, increase IV lasix to 60mg  bid would hold on any further metolazone. Could consider lasix drip over the weekend if not reaching net diuresis goals. She has several liters to diurese and likely will be here several days - would need outpatient PFTs, sleep study as initial workup. Could consider [facility] down the road as well as outpatient, if troubles diuresing would consider as inpatient.     2. Hypokalemia - written for 46mEq KCl x 3 doses today    For questions or updates, please contact [place] Please consult [email or web address] for contact info under         Signed, Carlyle Dolly, MD  09/17/2020, 8:36 AM

## 2020-09-17 NOTE — Progress Notes (Signed)
CRITICAL VALUE ALERT  Critical Value:  Potassium 2.7  Date & Time Notied:  09/17/20 at 0724  Provider Notified: Dr. Maurene Capes  Orders Received/Actions taken:

## 2020-09-18 LAB — BASIC METABOLIC PANEL
Anion gap: 14 (ref 5–15)
BUN: 21 mg/dL — ABNORMAL HIGH (ref 6–20)
CO2: 26 mmol/L (ref 22–32)
Calcium: 9 mg/dL (ref 8.9–10.3)
Chloride: 99 mmol/L (ref 98–111)
Creatinine, Ser: 1.49 mg/dL — ABNORMAL HIGH (ref 0.44–1.00)
GFR, Estimated: 44 mL/min — ABNORMAL LOW (ref >60)
Glucose, Bld: 96 mg/dL (ref 70–99)
Potassium: 2.7 mmol/L — CL (ref 3.5–5.1)
Sodium: 139 mmol/L (ref 135–145)

## 2020-09-18 LAB — MAGNESIUM: Magnesium: 1.7 mg/dL (ref 1.7–2.4)

## 2020-09-18 MED ORDER — POTASSIUM CHLORIDE CRYS ER 20 MEQ PO TBCR
40.0000 meq | EXTENDED_RELEASE_TABLET | ORAL | Status: AC
  Administered 2020-09-18 (×4): 40 meq via ORAL
  Filled 2020-09-18 (×4): qty 2

## 2020-09-18 MED ORDER — MAGNESIUM SULFATE 2 GM/50ML IV SOLN
2.0000 g | Freq: Once | INTRAVENOUS | Status: AC
  Administered 2020-09-18: 2 g via INTRAVENOUS
  Filled 2020-09-18: qty 50

## 2020-09-18 NOTE — Progress Notes (Addendum)
Patient Demographics:    Lauren Osborn, is a 45 y.o. female, DOB - 31-Aug-1976, BXU:[id number]  Admit date - 09/15/2020   Admitting Physician Bernadette Hoit, DO  Outpatient Primary MD for the patient is [name], MD  LOS - 2   Chief Complaint  Patient presents with  . Shortness of Breath        Subjective:    Lauren Osborn today has no fevers, no emesis,  No chest pain,   No shob at rest Has DOE Voiding well    Assessment  & Plan :    Active Problems:   Pulmonary edema   Hypokalemia   Prolonged QT interval   Acute kidney injury (HCC)   Elevated brain natriuretic peptide (BNP) level   GERD (gastroesophageal reflux disease)   Anasarca   Super obese  Brief Summary:- 45 y.o. female with medical history significant for hypertension and chronic lower extremity edema, admitted on 09/15/20 with progressive dyspnea on exertion, abdominal distension and lower extremity edema since 06/2020.  Compliance with oral diuretics--- on admission weight up to 358 pounds from recent wt around 280 lb (a few months ago her weight was 351lb)  A/p 1)HFpEF--- patient admitted with acute on chronic diastolic dysfunction CHF, repeat echo with preserved EF around 70% -BNP 318 but this may be underestimated in obese patients with diastolic dysfunction -Chest x-ray and clinical exam supports overload status/pulmonary edema -Failed oral diuretics as outpatient PTA , daily weights, fluid input and output charting -Weight appears to be down to 339 pounds from 358 pounds -Fluid balance negative over 4 L in last 24hrs C/n  IV Lasix to 60 twice daily -DOE persist  2)AKI----acute kidney injury--- due to aggressive diuresis -GFR is greater than 60 and creatinine was 1.0 on 08/13/2020 -Creatinine is down to 1.49 from 1.58 on admission -monitor closely with diuresis, if patient does not tolerate bolus Lasix doses from  the renal standpoint-Lasix drip may be better tolerated  renally adjust medications, avoid nephrotoxic agents / dehydration  / hypotension  3)Hypokalemia--- magnesium WNL, replace and monitor closely with aggressive diuresis -Give additional potassium replacement  4)Morbid Obesity- -Low calorie diet, portion control and increase physical activity discussed with patient -Body mass index is 60.12 kg/m.  -Patient admits to-Excessive Pepsi/soda intake--- she appears to get at least 1500 cal from just soda per day  5)H/o Prior partial thyroidectomy--- TSH WNL  Disposition/Need for in-Hospital Stay- patient unable to be discharged at this time due to --- very significant volume overload/pulmonary edema, failed oral diuretics needs IV diuretics and monitoring of electrolytes and renal function while aggressively diuresing  Status is: Inpatient  Remains inpatient appropriate because:Please see below   Disposition: The patient is from: Home              Anticipated d/c is to: Home              Anticipated d/c date is: > 3 days              Patient currently is not medically stable to d/c. Barriers: Not Clinically Stable-   Code Status :  -  Code Status: Full Code   Family Communication:    NA (patient is alert, awake and coherent)   Consults  :  card  DVT Prophylaxis  :   - SCDs   heparin injection 5,000 Units Start: 09/16/20 1400 SCDs Start: 09/16/20 7616    Lab Results  Component Value Date   PLT 247 09/16/2020    Inpatient Medications  Scheduled Meds: . furosemide  60 mg Intravenous BID  . heparin injection (subcutaneous)  5,000 Units Subcutaneous Q8H  . pantoprazole  40 mg Oral Daily  . potassium chloride  40 mEq Oral Q3H   Continuous Infusions: PRN Meds:.    Anti-infectives (From admission, onward)   None        Objective:   Vitals:   09/17/20 1308 09/17/20 2049 09/18/20 0442 09/18/20 1307  BP: 118/76 (!) 105/54 119/78 101/76  Pulse: 92 91 87 72  Resp:  20 18 18 18   Temp: 97.6 F (36.4 C) 98.2 F (36.8 C) 98.1 F (36.7 C)   TempSrc: Oral     SpO2: 100% 100% 98% 100%  Weight:   (!) 154 kg   Height:        Wt Readings from Last 3 Encounters:  09/18/20 (!) 154 kg  09/09/20 (!) 159.2 kg  08/13/20 129.7 kg     Intake/Output Summary (Last 24 hours) at 09/18/2020 1640 Last data filed at 09/18/2020 1400 Gross per 24 hour  Intake 275.48 ml  Output 4350 ml  Net -4074.52 ml     Physical Exam  Gen:- Awake Alert, morbidly obese, HEENT:- [place].AT, No sclera icterus Neck-Supple Neck, Lungs-diminished in bases, faint bibasilar rales CV- S1, S2 normal, regular  Abd-  +ve B.Sounds, Abd Soft, No tenderness, increased truncal adiposity    Extremity/Skin:- 3+  edema, pedal pulses present  Psych-affect is appropriate, oriented x3 Neuro-generalized weakness no new focal deficits, no tremors   Data Review:   Micro Results Recent Results (from the past 240 hour(s))  Resp Panel by RT-PCR (Flu A&B, Covid) Nasopharyngeal Swab     Status: None   Collection Time: 09/15/20 11:01 PM   Specimen: Nasopharyngeal Swab; Nasopharyngeal(NP) swabs in vial transport medium  Result Value Ref Range Status   SARS Coronavirus 2 by RT PCR NEGATIVE NEGATIVE Final    Comment: (NOTE) SARS-CoV-2 target nucleic acids are NOT DETECTED.  The SARS-CoV-2 RNA is generally detectable in upper respiratory specimens during the acute phase of infection. The lowest concentration of SARS-CoV-2 viral copies this assay can detect is 138 copies/mL. A negative result does not preclude SARS-Cov-2 infection and should not be used as the sole basis for treatment or other patient management decisions. A negative result may occur with  improper specimen collection/handling, submission of specimen other than nasopharyngeal swab, presence of viral mutation(s) within the areas targeted by this assay, and inadequate number of viral copies(<138 copies/mL). A negative result must be  combined with clinical observations, patient history, and epidemiological information. The expected result is Negative.  Fact Sheet for Patients:  EntrepreneurPulse.com.au  Fact Sheet for Healthcare Providers:  IncredibleEmployment.be  This test is no t yet approved or cleared by the [place] FDA and  has been authorized for detection and/or diagnosis of SARS-CoV-2 by FDA under an Emergency Use Authorization (EUA). This EUA will remain  in effect (meaning this test can be used) for the duration of the COVID-19 declaration under Section 564(b)(1) of the Act, 21 U.S.C.section 360bbb-3(b)(1), unless the authorization is terminated  or revoked sooner.       Influenza A by PCR NEGATIVE NEGATIVE Final   Influenza B by PCR NEGATIVE NEGATIVE Final  Comment: (NOTE) The Xpert Xpress SARS-CoV-2/FLU/RSV plus assay is intended as an aid in the diagnosis of influenza from Nasopharyngeal swab specimens and should not be used as a sole basis for treatment. Nasal washings and aspirates are unacceptable for Xpert Xpress SARS-CoV-2/FLU/RSV testing.  Fact Sheet for Patients: EntrepreneurPulse.com.au  Fact Sheet for Healthcare Providers: IncredibleEmployment.be  This test is not yet approved or cleared by the [place] FDA and has been authorized for detection and/or diagnosis of SARS-CoV-2 by FDA under an Emergency Use Authorization (EUA). This EUA will remain in effect (meaning this test can be used) for the duration of the COVID-19 declaration under Section 564(b)(1) of the Act, 21 U.S.C. section 360bbb-3(b)(1), unless the authorization is terminated or revoked.  Performed at [facility], [address], [place], [place] [address]     Radiology Reports DG Chest 2 View  Result Date: 09/17/2020 CLINICAL DATA:  Shortness of breath, fluid retention EXAM: CHEST - 2 VIEW COMPARISON:  09/15/2020 FINDINGS: No  significant change in chest radiographs with mild, diffuse interstitial opacity and a small right pleural effusion. No new or focal airspace opacity. The heart and mediastinum are unremarkable. Mild disc degenerative disease of the thoracic spine. IMPRESSION: No significant change in chest radiographs with mild, diffuse interstitial opacity and a small right pleural effusion, likely edema. No new or focal airspace opacity. Electronically Signed   By: Eddie Candle M.D.   On: 09/17/2020 09:00   DG Chest 2 View  Result Date: 09/15/2020 CLINICAL DATA:  Increasing shortness of breath and wheezing for 1 month. EXAM: CHEST - 2 VIEW COMPARISON:  Single-view of the chest 02/06/2020. FINDINGS: The patient has a small right pleural effusion. There is pulmonary edema. No consolidative process or pneumothorax. Heart size is upper normal. IMPRESSION: Pulmonary edema with a small right pleural effusion. Electronically Signed   By: Inge Rise M.D.   On: 09/15/2020 14:25   ECHOCARDIOGRAM COMPLETE  Result Date: 09/16/2020    ECHOCARDIOGRAM REPORT   Patient Name:   LARUA COLLIER Date of Exam: 09/16/2020 Medical Rec #:  [id number]      Height:       63.0 in Accession #:    [id number]     Weight:       358.0 lb Date of Birth:  12/27/1975      BSA:          2.477 m Patient Age:    4 years       BP:           113/82 mmHg Patient Gender: F              HR:           89 bpm. Exam Location:  [facility] Procedure: 2D Echo Indications:     Dyspnea R06.00  History:         Patient has prior history of Echocardiogram examinations, most                  recent 03/18/2020. Risk Factors:Former Smoker. Acute Kidney                  Injury, GERD, Prolonged QT interval.  Sonographer:     Leavy Cella RDCS (AE) Referring Phys:  [id number] Tobe Sos ADEFESO Diagnosing Phys: Carlyle Dolly MD IMPRESSIONS  1. Left ventricular ejection fraction, by estimation, is 65 to 70%. The left ventricle has normal function. The left ventricle has no  regional wall motion abnormalities. Left ventricular diastolic parameters  were normal.  2. RV not well visualized. Grossly appears enlarged. Function appears to be decreased but difficult to quantify. Intermittent septal bounce likely secondary to RV strain, as there is no echocardiographic signs of constrictive pericarditis (normal mitral  inflow, normal MV tissue Doppler). . Right ventricular systolic function was not well visualized. The right ventricular size is not well visualized.  3. The mitral valve is normal in structure. No evidence of mitral valve regurgitation. No evidence of mitral stenosis.  4. The aortic valve was not well visualized. Aortic valve regurgitation is not visualized. No aortic stenosis is present. FINDINGS  Left Ventricle: Left ventricular ejection fraction, by estimation, is 65 to 70%. The left ventricle has normal function. The left ventricle has no regional wall motion abnormalities. Definity contrast agent was given IV to delineate the left ventricular  endocardial borders. The left ventricular internal cavity size was normal in size. There is no left ventricular hypertrophy. Left ventricular diastolic parameters were normal. Right Ventricle: RV not well visualized. Grossly appears enlarged. Function appears to be decreased but difficult to quantify. Intermittent septal bounce likely secondary to RV strain, as there is no echocardiographic signs of constrictive pericarditis (normal mitral inflow, normal MV tissue Doppler). The right ventricular size is not well visualized. Right vetricular wall thickness was not well visualized. Right ventricular systolic function was not well visualized. Left Atrium: Left atrial size was normal in size. Right Atrium: Right atrial size was not well visualized. Pericardium: There is no evidence of pericardial effusion. Mitral Valve: The mitral valve is normal in structure. No evidence of mitral valve regurgitation. No evidence of mitral valve stenosis.  Tricuspid Valve: The tricuspid valve is not well visualized. Tricuspid valve regurgitation is trivial. No evidence of tricuspid stenosis. Aortic Valve: The aortic valve was not well visualized. Aortic valve regurgitation is not visualized. No aortic stenosis is present. Aortic valve mean gradient measures 3.0 mmHg. Aortic valve peak gradient measures 6.2 mmHg. Aortic valve area, by VTI measures 2.54 cm. Pulmonic Valve: The pulmonic valve was not well visualized. Pulmonic valve regurgitation is not visualized. No evidence of pulmonic stenosis. Aorta: The aortic root is normal in size and structure. Venous: The inferior vena cava was not well visualized. IAS/Shunts: The interatrial septum was not well visualized.  LEFT VENTRICLE PLAX 2D LVIDd:         4.25 cm  Diastology LVIDs:         2.60 cm  LV e' medial:    10.40 cm/s LV PW:         0.90 cm  LV E/e' medial:  10.4 LV IVS:        0.82 cm  LV e' lateral:   11.50 cm/s LVOT diam:     1.80 cm  LV E/e' lateral: 9.4 LV SV:         53 LV SV Index:   21 LVOT Area:     2.54 cm  RIGHT VENTRICLE TAPSE (M-mode): 1.6 cm LEFT ATRIUM             Index       RIGHT ATRIUM           Index LA diam:        3.50 cm 1.41 cm/m  RA Area:     14.70 cm LA Vol (A2C):   59.4 ml 23.99 ml/m RA Volume:   40.40 ml  16.31 ml/m LA Vol (A4C):   31.9 ml 12.88 ml/m LA Biplane Vol: 44.2 ml 17.85 ml/m  AORTIC VALVE AV Area (Vmax):    2.21 cm AV Area (Vmean):   2.24 cm AV Area (VTI):     2.54 cm AV Vmax:           124.23 cm/s AV Vmean:          79.476 cm/s AV VTI:            0.207 m AV Peak Grad:      6.2 mmHg AV Mean Grad:      3.0 mmHg LVOT Vmax:         107.75 cm/s LVOT Vmean:        69.931 cm/s LVOT VTI:          0.207 m LVOT/AV VTI ratio: 1.00  AORTA Ao Root diam: 2.80 cm MITRAL VALVE                TRICUSPID VALVE MV Area (PHT): 3.76 cm     TR Peak grad:   15.1 mmHg MV Decel Time: 202 msec     TR Vmax:        194.00 cm/s MV E velocity: 108.00 cm/s MV A velocity: 58.00 cm/s   SHUNTS MV  E/A ratio:  1.86         Systemic VTI:  0.21 m                             Systemic Diam: 1.80 cm Carlyle Dolly MD Electronically signed by Carlyle Dolly MD Signature Date/Time: 09/16/2020/11:16:17 AM    Final (Updated)    US Abdomen Limited RUQ (LIVER/GB)  Result Date: 09/08/2020 CLINICAL DATA:  Abnormal LFTs for 6 months, history hypertension EXAM: ULTRASOUND ABDOMEN LIMITED RIGHT UPPER QUADRANT COMPARISON:  None FINDINGS: Gallbladder: Suboptimally assessed due to body habitus. Large shadowing echogenic focus seen within gallbladder 5.3 cm length likely a large gallstone versus less likely multiple tiny calculi. Mildly thickened gallbladder wall. No pericholecystic fluid. No sonographic Murphy sign. Common bile duct: Diameter: 4 mm Liver: Normal parenchymal echogenicity. Hyperechoic mass RIGHT lobe 3.4 x 3.8 x 2.7 cm. No additional masses identified. Portal vein is patent on color Doppler imaging with normal direction of blood flow towards the liver. Other: No RIGHT upper quadrant free fluid. IMPRESSION: Cholelithiasis with probable large single calculus 5.3 cm diameter versus less likely multiple tiny dependent calculi within gallbladder. No biliary dilatation. Hyperechoic mass RIGHT lobe liver 3.8 cm in size; finding is nonspecific, could potentially represent a hepatic hemangioma but is not diagnostic and other etiologies are not excluded; further characterization by MR imaging with and without contrast recommended. Electronically Signed   By: Lavonia Dana M.D.   On: 09/08/2020 10:23     CBC Recent Labs  Lab 09/15/20 1548 09/16/20 0215  WBC 8.9 7.5  HGB 12.7 12.1  HCT 40.8 38.1  PLT 248 247  MCV 95.1 94.1  MCH 29.6 29.9  MCHC 31.1 31.8  RDW 17.1* 17.2*    Chemistries  Recent Labs  Lab 09/15/20 1548 09/16/20 0215 09/17/20 0612 09/18/20 0607  NA 139 139 141 139  K 3.4* 2.9* 2.7* 2.7*  CL 103 102 102 99  CO2 25 26 27 26   GLUCOSE 104* 100* 100* 96  BUN 20 20 19  21*  CREATININE  1.48* 1.58* 1.46* 1.49*  CALCIUM 9.1 8.9 9.2 9.0  MG  --  1.8  --  1.7  AST 26 26  --   --   ALT 16 14  --   --  ALKPHOS 51 49  --   --   BILITOT 2.4* 2.4*  --   --    ------------------------------------------------------------------------------------------------------------------ No results for input(s): CHOL, HDL, LDLCALC, TRIG, CHOLHDL, LDLDIRECT in the last 72 hours.  No results found for: HGBA1C ------------------------------------------------------------------------------------------------------------------ Recent Labs    09/17/20 0612  TSH 4.037   ------------------------------------------------------------------------------------------------------------------ No results for input(s): VITAMINB12, FOLATE, FERRITIN, TIBC, IRON, RETICCTPCT in the last 72 hours.  Coagulation profile Recent Labs  Lab 09/16/20 0215  INR 1.3*    No results for input(s): DDIMER in the last 72 hours.  Cardiac Enzymes No results for input(s): CKMB, TROPONINI, MYOGLOBIN in the last 168 hours.  Invalid input(s): CK ------------------------------------------------------------------------------------------------------------------    Component Value Date/Time   BNP 318.0 (H) 09/15/2020 1548    Roxan Hockey M.D on 09/18/2020 at 4:40 PM  Go to [email or web address] - for contact info  [facility] - Office  [phone]

## 2020-09-19 LAB — RENAL FUNCTION PANEL
Albumin: 4 g/dL (ref 3.5–5.0)
Anion gap: 13 (ref 5–15)
BUN: 20 mg/dL (ref 6–20)
CO2: 29 mmol/L (ref 22–32)
Calcium: 9.1 mg/dL (ref 8.9–10.3)
Chloride: 97 mmol/L — ABNORMAL LOW (ref 98–111)
Creatinine, Ser: 1.6 mg/dL — ABNORMAL HIGH (ref 0.44–1.00)
GFR, Estimated: 40 mL/min — ABNORMAL LOW (ref >60)
Glucose, Bld: 93 mg/dL (ref 70–99)
Phosphorus: 4.2 mg/dL (ref 2.5–4.6)
Potassium: 2.8 mmol/L — ABNORMAL LOW (ref 3.5–5.1)
Sodium: 139 mmol/L (ref 135–145)

## 2020-09-19 MED ORDER — POTASSIUM CHLORIDE CRYS ER 20 MEQ PO TBCR
40.0000 meq | EXTENDED_RELEASE_TABLET | ORAL | Status: AC
  Administered 2020-09-19 – 2020-09-20 (×4): 40 meq via ORAL
  Filled 2020-09-19 (×3): qty 2

## 2020-09-19 NOTE — Progress Notes (Signed)
Patient Demographics:    Lauren Osborn, is a 45 y.o. female, DOB - July 09, 1976, QIH:[id number]  Admit date - 09/15/2020   Admitting Physician Bernadette Hoit, DO  Outpatient Primary MD for the patient is Lauren Fire, MD  LOS - 3   Chief Complaint  Patient presents with  . Shortness of Breath        Subjective:    [name] today has no fevers, no emesis,  No chest pain,    -Continues to void well, -No orthopnea or significant shortness of breath at rest, however dyspnea on exertion persist   Assessment  & Plan :    Active Problems:   Pulmonary edema   Hypokalemia   Prolonged QT interval   Acute kidney injury (HCC)   Elevated brain natriuretic peptide (BNP) level   GERD (gastroesophageal reflux disease)   Anasarca   Super obese  Brief Summary:- 45 y.o. female with medical history significant for hypertension and chronic lower extremity edema, admitted on 09/15/20 with progressive dyspnea on exertion, abdominal distension and lower extremity edema since 06/2020.  Compliance with oral diuretics--- on admission weight up to 358 pounds from recent wt around 280 lb (a few months ago her weight was 351lb)  A/p 1)HFpEF--- patient admitted with acute on chronic diastolic dysfunction CHF, repeat echo with preserved EF around 70% -BNP 318 but this may be underestimated in obese patients with diastolic dysfunction -Chest x-ray and clinical exam supports overload status/pulmonary edema -Failed oral diuretics as outpatient PTA , daily weights, fluid input and output charting -Weight appears to be down to 335.9 pounds from 358 pounds -Fluid balance negative over 2.4 L in last 24hrs C/n  IV Lasix to 60 twice daily -DOE persist  2)AKI----acute kidney injury--- due to aggressive diuresis -GFR is greater than 60 and creatinine was 1.0 on 08/13/2020 -Creatinine is down to 1.60 from 1.58 on  admission -monitor closely with diuresis, if patient does not tolerate bolus Lasix doses from the renal standpoint-Lasix drip may be better tolerated  renally adjust medications, avoid nephrotoxic agents / dehydration  / hypotension  3)Hypokalemia--- magnesium WNL, replace and monitor closely with aggressive diuresis -Give additional potassium replacement  4)Morbid Obesity- -Low calorie diet, portion control and increase physical activity discussed with patient -Body mass index is 59.52 kg/m.  -Patient admits to-Excessive Pepsi/soda intake--- she appears to get at least 1500 cal from just soda per day  5)H/o Prior partial thyroidectomy--- TSH WNL  Disposition/Need for in-Hospital Stay- patient unable to be discharged at this time due to --- very significant volume overload/pulmonary edema, failed oral diuretics needs IV diuretics and monitoring of electrolytes and renal function while aggressively diuresing  Status is: Inpatient  Remains inpatient appropriate because:Please see below   Disposition: The patient is from: Home              Anticipated d/c is to: Home              Anticipated d/c date is: 1 day              Patient currently is not medically stable to d/c. Barriers: Not Clinically Stable-   Code Status :  -  Code Status: Full Code   Family Communication:    NA (  patient is alert, awake and coherent)   Consults  :  card  DVT Prophylaxis  :   - SCDs   heparin injection 5,000 Units Start: 09/16/20 1400 SCDs Start: 09/16/20 0137    Lab Results  Component Value Date   PLT 247 09/16/2020    Inpatient Medications  Scheduled Meds: . furosemide  60 mg Intravenous BID  . heparin injection (subcutaneous)  5,000 Units Subcutaneous Q8H  . pantoprazole  40 mg Oral Daily   Continuous Infusions: PRN Meds:.    Anti-infectives (From admission, onward)   None        Objective:   Vitals:   09/18/20 1307 09/18/20 2114 09/19/20 0533 09/19/20 1429  BP: 101/76  119/86 (!) 88/65 109/72  Pulse: 72 89 69 84  Resp: 18 17 19 20   Temp:  98.3 F (36.8 C) 97.9 F (36.6 C) 98.2 F (36.8 C)  TempSrc:  Oral  Oral  SpO2: 100% 100% 94% 97%  Weight:   (!) 152.4 kg   Height:        Wt Readings from Last 3 Encounters:  09/19/20 (!) 152.4 kg  09/09/20 (!) 159.2 kg  08/13/20 129.7 kg     Intake/Output Summary (Last 24 hours) at 09/19/2020 1507 Last data filed at 09/19/2020 1139 Gross per 24 hour  Intake --  Output 2400 ml  Net -2400 ml     Physical Exam  Gen:- Awake Alert, morbidly obese, no conversational dyspnea HEENT:- [place].AT, No sclera icterus Neck-Supple Neck, Lungs-improving air movement, no wheezing CV- S1, S2 normal, regular  Abd-  +ve B.Sounds, Abd Soft, No tenderness, increased truncal adiposity    Extremity/Skin:-Improving bilateral lower extremity edema, pedal pulses present  Psych-affect is appropriate, oriented x3 Neuro-generalized weakness no new focal deficits, no tremors   Data Review:   Micro Results Recent Results (from the past 240 hour(s))  Resp Panel by RT-PCR (Flu A&B, Covid) Nasopharyngeal Swab     Status: None   Collection Time: 09/15/20 11:01 PM   Specimen: Nasopharyngeal Swab; Nasopharyngeal(NP) swabs in vial transport medium  Result Value Ref Range Status   SARS Coronavirus 2 by RT PCR NEGATIVE NEGATIVE Final    Comment: (NOTE) SARS-CoV-2 target nucleic acids are NOT DETECTED.  The SARS-CoV-2 RNA is generally detectable in upper respiratory specimens during the acute phase of infection. The lowest concentration of SARS-CoV-2 viral copies this assay can detect is 138 copies/mL. A negative result does not preclude SARS-Cov-2 infection and should not be used as the sole basis for treatment or other patient management decisions. A negative result may occur with  improper specimen collection/handling, submission of specimen other than nasopharyngeal swab, presence of viral mutation(s) within the areas targeted  by this assay, and inadequate number of viral copies(<138 copies/mL). A negative result must be combined with clinical observations, patient history, and epidemiological information. The expected result is Negative.  Fact Sheet for Patients:  EntrepreneurPulse.com.au  Fact Sheet for Healthcare Providers:  IncredibleEmployment.be  This test is no t yet approved or cleared by the [place] FDA and  has been authorized for detection and/or diagnosis of SARS-CoV-2 by FDA under an Emergency Use Authorization (EUA). This EUA will remain  in effect (meaning this test can be used) for the duration of the COVID-19 declaration under Section 564(b)(1) of the Act, 21 U.S.C.section 360bbb-3(b)(1), unless the authorization is terminated  or revoked sooner.       Influenza A by PCR NEGATIVE NEGATIVE Final   Influenza B by PCR NEGATIVE  NEGATIVE Final    Comment: (NOTE) The Xpert Xpress SARS-CoV-2/FLU/RSV plus assay is intended as an aid in the diagnosis of influenza from Nasopharyngeal swab specimens and should not be used as a sole basis for treatment. Nasal washings and aspirates are unacceptable for Xpert Xpress SARS-CoV-2/FLU/RSV testing.  Fact Sheet for Patients: EntrepreneurPulse.com.au  Fact Sheet for Healthcare Providers: IncredibleEmployment.be  This test is not yet approved or cleared by the [place] FDA and has been authorized for detection and/or diagnosis of SARS-CoV-2 by FDA under an Emergency Use Authorization (EUA). This EUA will remain in effect (meaning this test can be used) for the duration of the COVID-19 declaration under Section 564(b)(1) of the Act, 21 U.S.C. section 360bbb-3(b)(1), unless the authorization is terminated or revoked.  Performed at [facility], [address], [place], [place] [address]     Radiology Reports DG Chest 2 View  Result Date: 09/17/2020 CLINICAL DATA:   Shortness of breath, fluid retention EXAM: CHEST - 2 VIEW COMPARISON:  09/15/2020 FINDINGS: No significant change in chest radiographs with mild, diffuse interstitial opacity and a small right pleural effusion. No new or focal airspace opacity. The heart and mediastinum are unremarkable. Mild disc degenerative disease of the thoracic spine. IMPRESSION: No significant change in chest radiographs with mild, diffuse interstitial opacity and a small right pleural effusion, likely edema. No new or focal airspace opacity. Electronically Signed   By: Eddie Candle M.D.   On: 09/17/2020 09:00   DG Chest 2 View  Result Date: 09/15/2020 CLINICAL DATA:  Increasing shortness of breath and wheezing for 1 month. EXAM: CHEST - 2 VIEW COMPARISON:  Single-view of the chest 02/06/2020. FINDINGS: The patient has a small right pleural effusion. There is pulmonary edema. No consolidative process or pneumothorax. Heart size is upper normal. IMPRESSION: Pulmonary edema with a small right pleural effusion. Electronically Signed   By: Inge Rise M.D.   On: 09/15/2020 14:25   ECHOCARDIOGRAM COMPLETE  Result Date: 09/16/2020    ECHOCARDIOGRAM REPORT   Patient Name:   POCAHONTAS COHENOUR Date of Exam: 09/16/2020 Medical Rec #:  [id number]      Height:       63.0 in Accession #:    [id number]     Weight:       358.0 lb Date of Birth:  07-Jan-1976      BSA:          2.477 m Patient Age:    31 years       BP:           113/82 mmHg Patient Gender: F              HR:           89 bpm. Exam Location:  [facility] Procedure: 2D Echo Indications:     Dyspnea R06.00  History:         Patient has prior history of Echocardiogram examinations, most                  recent 03/18/2020. Risk Factors:Former Smoker. Acute Kidney                  Injury, GERD, Prolonged QT interval.  Sonographer:     Leavy Cella RDCS (AE) Referring Phys:  [id number] Tobe Sos ADEFESO Diagnosing Phys: Carlyle Dolly MD IMPRESSIONS  1. Left ventricular ejection fraction, by  estimation, is 65 to 70%. The left ventricle has normal function. The left ventricle has no regional wall motion  abnormalities. Left ventricular diastolic parameters were normal.  2. RV not well visualized. Grossly appears enlarged. Function appears to be decreased but difficult to quantify. Intermittent septal bounce likely secondary to RV strain, as there is no echocardiographic signs of constrictive pericarditis (normal mitral  inflow, normal MV tissue Doppler). . Right ventricular systolic function was not well visualized. The right ventricular size is not well visualized.  3. The mitral valve is normal in structure. No evidence of mitral valve regurgitation. No evidence of mitral stenosis.  4. The aortic valve was not well visualized. Aortic valve regurgitation is not visualized. No aortic stenosis is present. FINDINGS  Left Ventricle: Left ventricular ejection fraction, by estimation, is 65 to 70%. The left ventricle has normal function. The left ventricle has no regional wall motion abnormalities. Definity contrast agent was given IV to delineate the left ventricular  endocardial borders. The left ventricular internal cavity size was normal in size. There is no left ventricular hypertrophy. Left ventricular diastolic parameters were normal. Right Ventricle: RV not well visualized. Grossly appears enlarged. Function appears to be decreased but difficult to quantify. Intermittent septal bounce likely secondary to RV strain, as there is no echocardiographic signs of constrictive pericarditis (normal mitral inflow, normal MV tissue Doppler). The right ventricular size is not well visualized. Right vetricular wall thickness was not well visualized. Right ventricular systolic function was not well visualized. Left Atrium: Left atrial size was normal in size. Right Atrium: Right atrial size was not well visualized. Pericardium: There is no evidence of pericardial effusion. Mitral Valve: The mitral valve is normal in  structure. No evidence of mitral valve regurgitation. No evidence of mitral valve stenosis. Tricuspid Valve: The tricuspid valve is not well visualized. Tricuspid valve regurgitation is trivial. No evidence of tricuspid stenosis. Aortic Valve: The aortic valve was not well visualized. Aortic valve regurgitation is not visualized. No aortic stenosis is present. Aortic valve mean gradient measures 3.0 mmHg. Aortic valve peak gradient measures 6.2 mmHg. Aortic valve area, by VTI measures 2.54 cm. Pulmonic Valve: The pulmonic valve was not well visualized. Pulmonic valve regurgitation is not visualized. No evidence of pulmonic stenosis. Aorta: The aortic root is normal in size and structure. Venous: The inferior vena cava was not well visualized. IAS/Shunts: The interatrial septum was not well visualized.  LEFT VENTRICLE PLAX 2D LVIDd:         4.25 cm  Diastology LVIDs:         2.60 cm  LV e' medial:    10.40 cm/s LV PW:         0.90 cm  LV E/e' medial:  10.4 LV IVS:        0.82 cm  LV e' lateral:   11.50 cm/s LVOT diam:     1.80 cm  LV E/e' lateral: 9.4 LV SV:         53 LV SV Index:   21 LVOT Area:     2.54 cm  RIGHT VENTRICLE TAPSE (M-mode): 1.6 cm LEFT ATRIUM             Index       RIGHT ATRIUM           Index LA diam:        3.50 cm 1.41 cm/m  RA Area:     14.70 cm LA Vol (A2C):   59.4 ml 23.99 ml/m RA Volume:   40.40 ml  16.31 ml/m LA Vol (A4C):   31.9 ml 12.88 ml/m LA Biplane Vol:  44.2 ml 17.85 ml/m  AORTIC VALVE AV Area (Vmax):    2.21 cm AV Area (Vmean):   2.24 cm AV Area (VTI):     2.54 cm AV Vmax:           124.23 cm/s AV Vmean:          79.476 cm/s AV VTI:            0.207 m AV Peak Grad:      6.2 mmHg AV Mean Grad:      3.0 mmHg LVOT Vmax:         107.75 cm/s LVOT Vmean:        69.931 cm/s LVOT VTI:          0.207 m LVOT/AV VTI ratio: 1.00  AORTA Ao Root diam: 2.80 cm MITRAL VALVE                TRICUSPID VALVE MV Area (PHT): 3.76 cm     TR Peak grad:   15.1 mmHg MV Decel Time: 202 msec     TR  Vmax:        194.00 cm/s MV E velocity: 108.00 cm/s MV A velocity: 58.00 cm/s   SHUNTS MV E/A ratio:  1.86         Systemic VTI:  0.21 m                             Systemic Diam: 1.80 cm Carlyle Dolly MD Electronically signed by Carlyle Dolly MD Signature Date/Time: 09/16/2020/11:16:17 AM    Final (Updated)    US Abdomen Limited RUQ (LIVER/GB)  Result Date: 09/08/2020 CLINICAL DATA:  Abnormal LFTs for 6 months, history hypertension EXAM: ULTRASOUND ABDOMEN LIMITED RIGHT UPPER QUADRANT COMPARISON:  None FINDINGS: Gallbladder: Suboptimally assessed due to body habitus. Large shadowing echogenic focus seen within gallbladder 5.3 cm length likely a large gallstone versus less likely multiple tiny calculi. Mildly thickened gallbladder wall. No pericholecystic fluid. No sonographic Murphy sign. Common bile duct: Diameter: 4 mm Liver: Normal parenchymal echogenicity. Hyperechoic mass RIGHT lobe 3.4 x 3.8 x 2.7 cm. No additional masses identified. Portal vein is patent on color Doppler imaging with normal direction of blood flow towards the liver. Other: No RIGHT upper quadrant free fluid. IMPRESSION: Cholelithiasis with probable large single calculus 5.3 cm diameter versus less likely multiple tiny dependent calculi within gallbladder. No biliary dilatation. Hyperechoic mass RIGHT lobe liver 3.8 cm in size; finding is nonspecific, could potentially represent a hepatic hemangioma but is not diagnostic and other etiologies are not excluded; further characterization by MR imaging with and without contrast recommended. Electronically Signed   By: Lavonia Dana M.D.   On: 09/08/2020 10:23     CBC Recent Labs  Lab 09/15/20 1548 09/16/20 0215  WBC 8.9 7.5  HGB 12.7 12.1  HCT 40.8 38.1  PLT 248 247  MCV 95.1 94.1  MCH 29.6 29.9  MCHC 31.1 31.8  RDW 17.1* 17.2*    Chemistries  Recent Labs  Lab 09/15/20 1548 09/16/20 0215 09/17/20 0612 09/18/20 0607 09/19/20 0500  NA 139 139 141 139 139  K 3.4* 2.9*  2.7* 2.7* 2.8*  CL 103 102 102 99 97*  CO2 25 26 27 26 29   GLUCOSE 104* 100* 100* 96 93  BUN 20 20 19  21* 20  CREATININE 1.48* 1.58* 1.46* 1.49* 1.60*  CALCIUM 9.1 8.9 9.2 9.0 9.1  MG  --  1.8  --  1.7  --   AST 26 26  --   --   --   ALT 16 14  --   --   --   ALKPHOS 51 49  --   --   --   BILITOT 2.4* 2.4*  --   --   --    ------------------------------------------------------------------------------------------------------------------ No results for input(s): CHOL, HDL, LDLCALC, TRIG, CHOLHDL, LDLDIRECT in the last 72 hours.  No results found for: HGBA1C ------------------------------------------------------------------------------------------------------------------ Recent Labs    09/17/20 0612  TSH 4.037   ------------------------------------------------------------------------------------------------------------------ No results for input(s): VITAMINB12, FOLATE, FERRITIN, TIBC, IRON, RETICCTPCT in the last 72 hours.  Coagulation profile Recent Labs  Lab 09/16/20 0215  INR 1.3*    No results for input(s): DDIMER in the last 72 hours.  Cardiac Enzymes No results for input(s): CKMB, TROPONINI, MYOGLOBIN in the last 168 hours.  Invalid input(s): CK ------------------------------------------------------------------------------------------------------------------    Component Value Date/Time   BNP 318.0 (H) 09/15/2020 1548    Roxan Hockey M.D on 09/19/2020 at 3:07 PM  Go to [email or web address] - for contact info  [facility] - Office  [phone]

## 2020-09-20 ENCOUNTER — Inpatient Hospital Stay (HOSPITAL_COMMUNITY): Payer: Managed Care, Other (non HMO)

## 2020-09-20 DIAGNOSIS — R601 Generalized edema: Secondary | ICD-10-CM

## 2020-09-20 DIAGNOSIS — R0602 Shortness of breath: Secondary | ICD-10-CM | POA: Diagnosis not present

## 2020-09-20 DIAGNOSIS — N179 Acute kidney failure, unspecified: Secondary | ICD-10-CM | POA: Diagnosis not present

## 2020-09-20 DIAGNOSIS — J90 Pleural effusion, not elsewhere classified: Secondary | ICD-10-CM | POA: Diagnosis not present

## 2020-09-20 LAB — RENAL FUNCTION PANEL
Albumin: 4 g/dL (ref 3.5–5.0)
Anion gap: 14 (ref 5–15)
BUN: 21 mg/dL — ABNORMAL HIGH (ref 6–20)
CO2: 30 mmol/L (ref 22–32)
Calcium: 9.2 mg/dL (ref 8.9–10.3)
Chloride: 96 mmol/L — ABNORMAL LOW (ref 98–111)
Creatinine, Ser: 1.58 mg/dL — ABNORMAL HIGH (ref 0.44–1.00)
GFR, Estimated: 41 mL/min — ABNORMAL LOW (ref >60)
Glucose, Bld: 95 mg/dL (ref 70–99)
Phosphorus: 4 mg/dL (ref 2.5–4.6)
Potassium: 3.1 mmol/L — ABNORMAL LOW (ref 3.5–5.1)
Sodium: 140 mmol/L (ref 135–145)

## 2020-09-20 LAB — MAGNESIUM: Magnesium: 1.8 mg/dL (ref 1.7–2.4)

## 2020-09-20 IMAGING — DX DG CHEST 2V
2 series · 2 of 2 positions shown · non-contrast
Comparison: Radiographs [DATE] and [DATE].

CLINICAL DATA: Persistent fluid retention over the last 3 months
with weight gain and intermittent shortness of breath. Former
smoker.

EXAM:
CHEST - 2 VIEW

[chest pa]
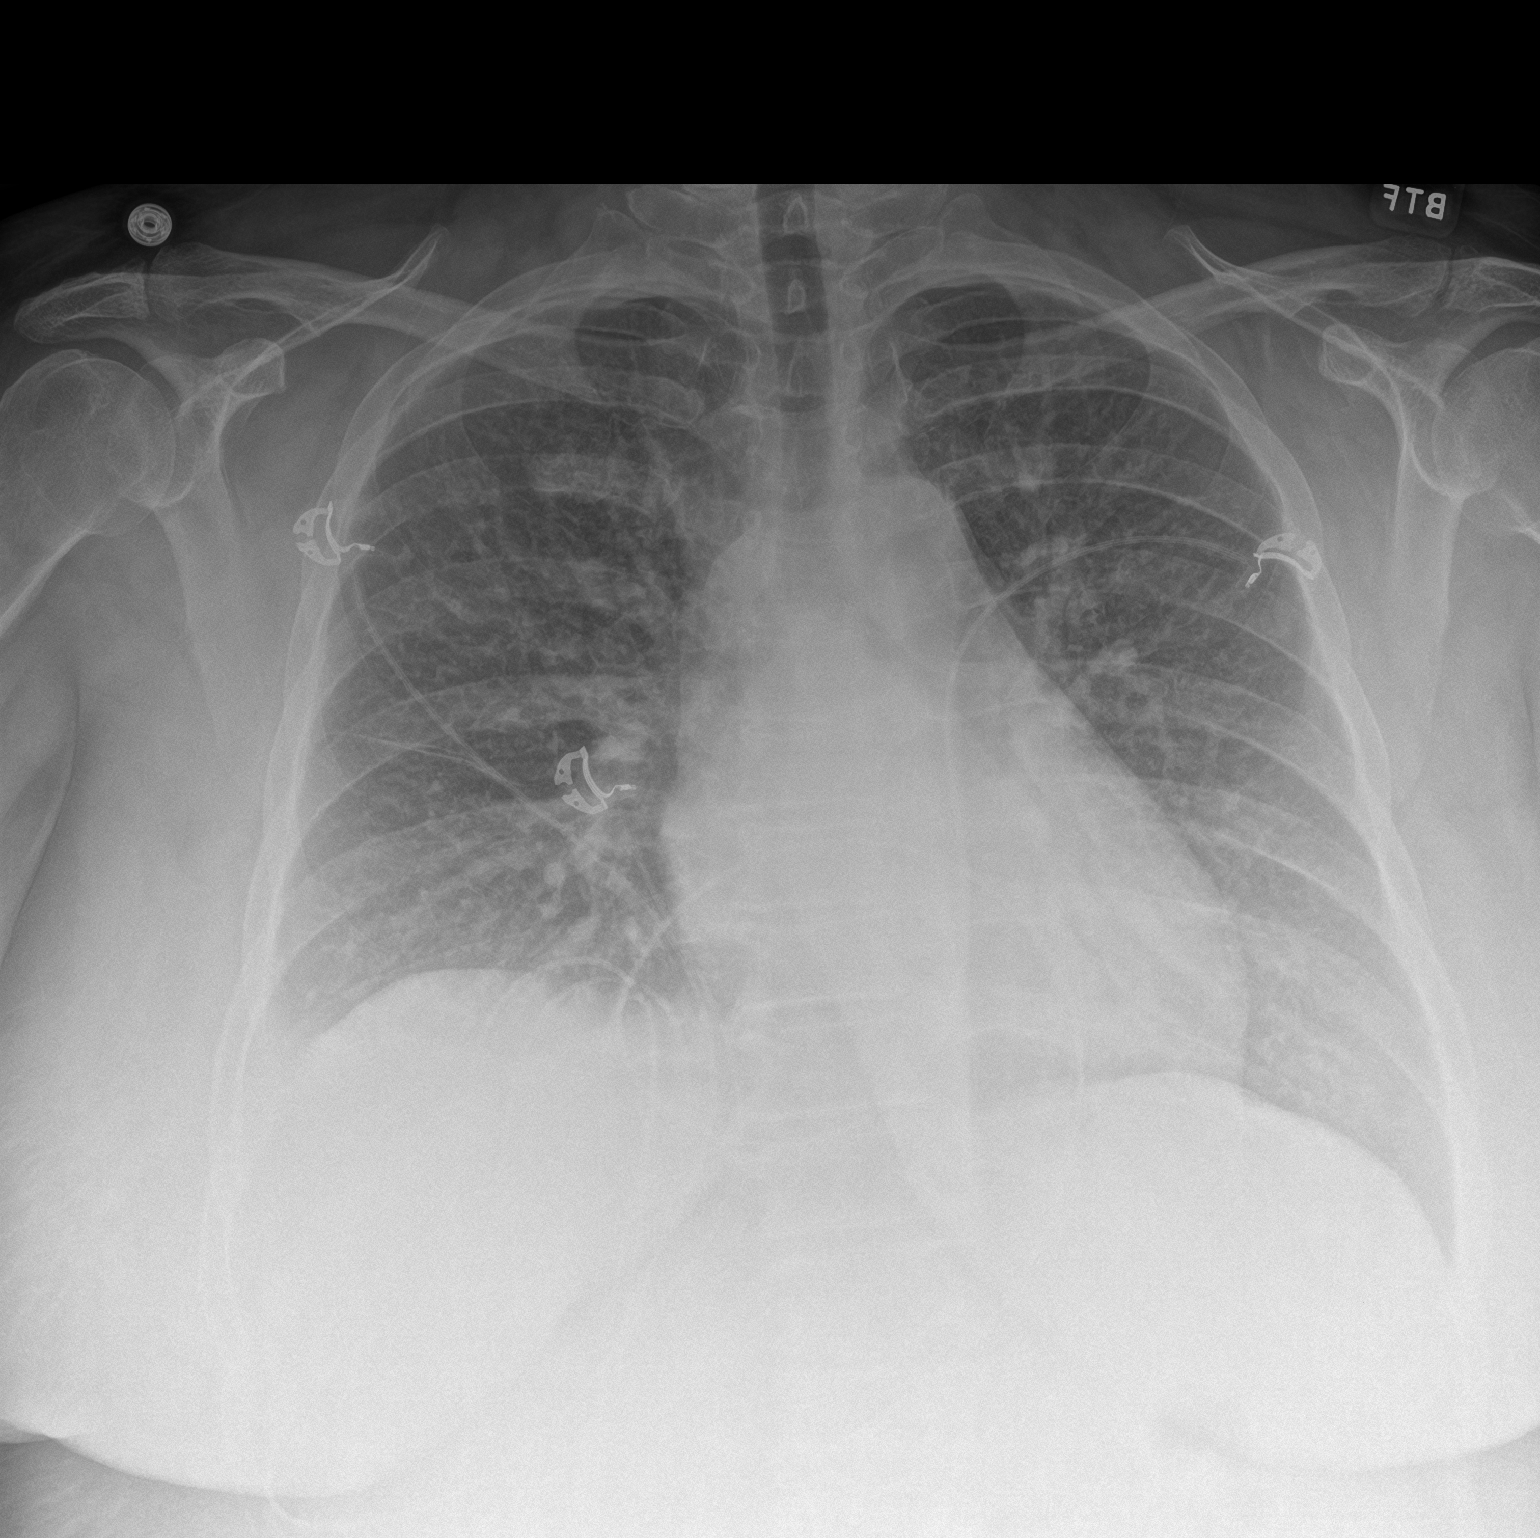

[chest lat]
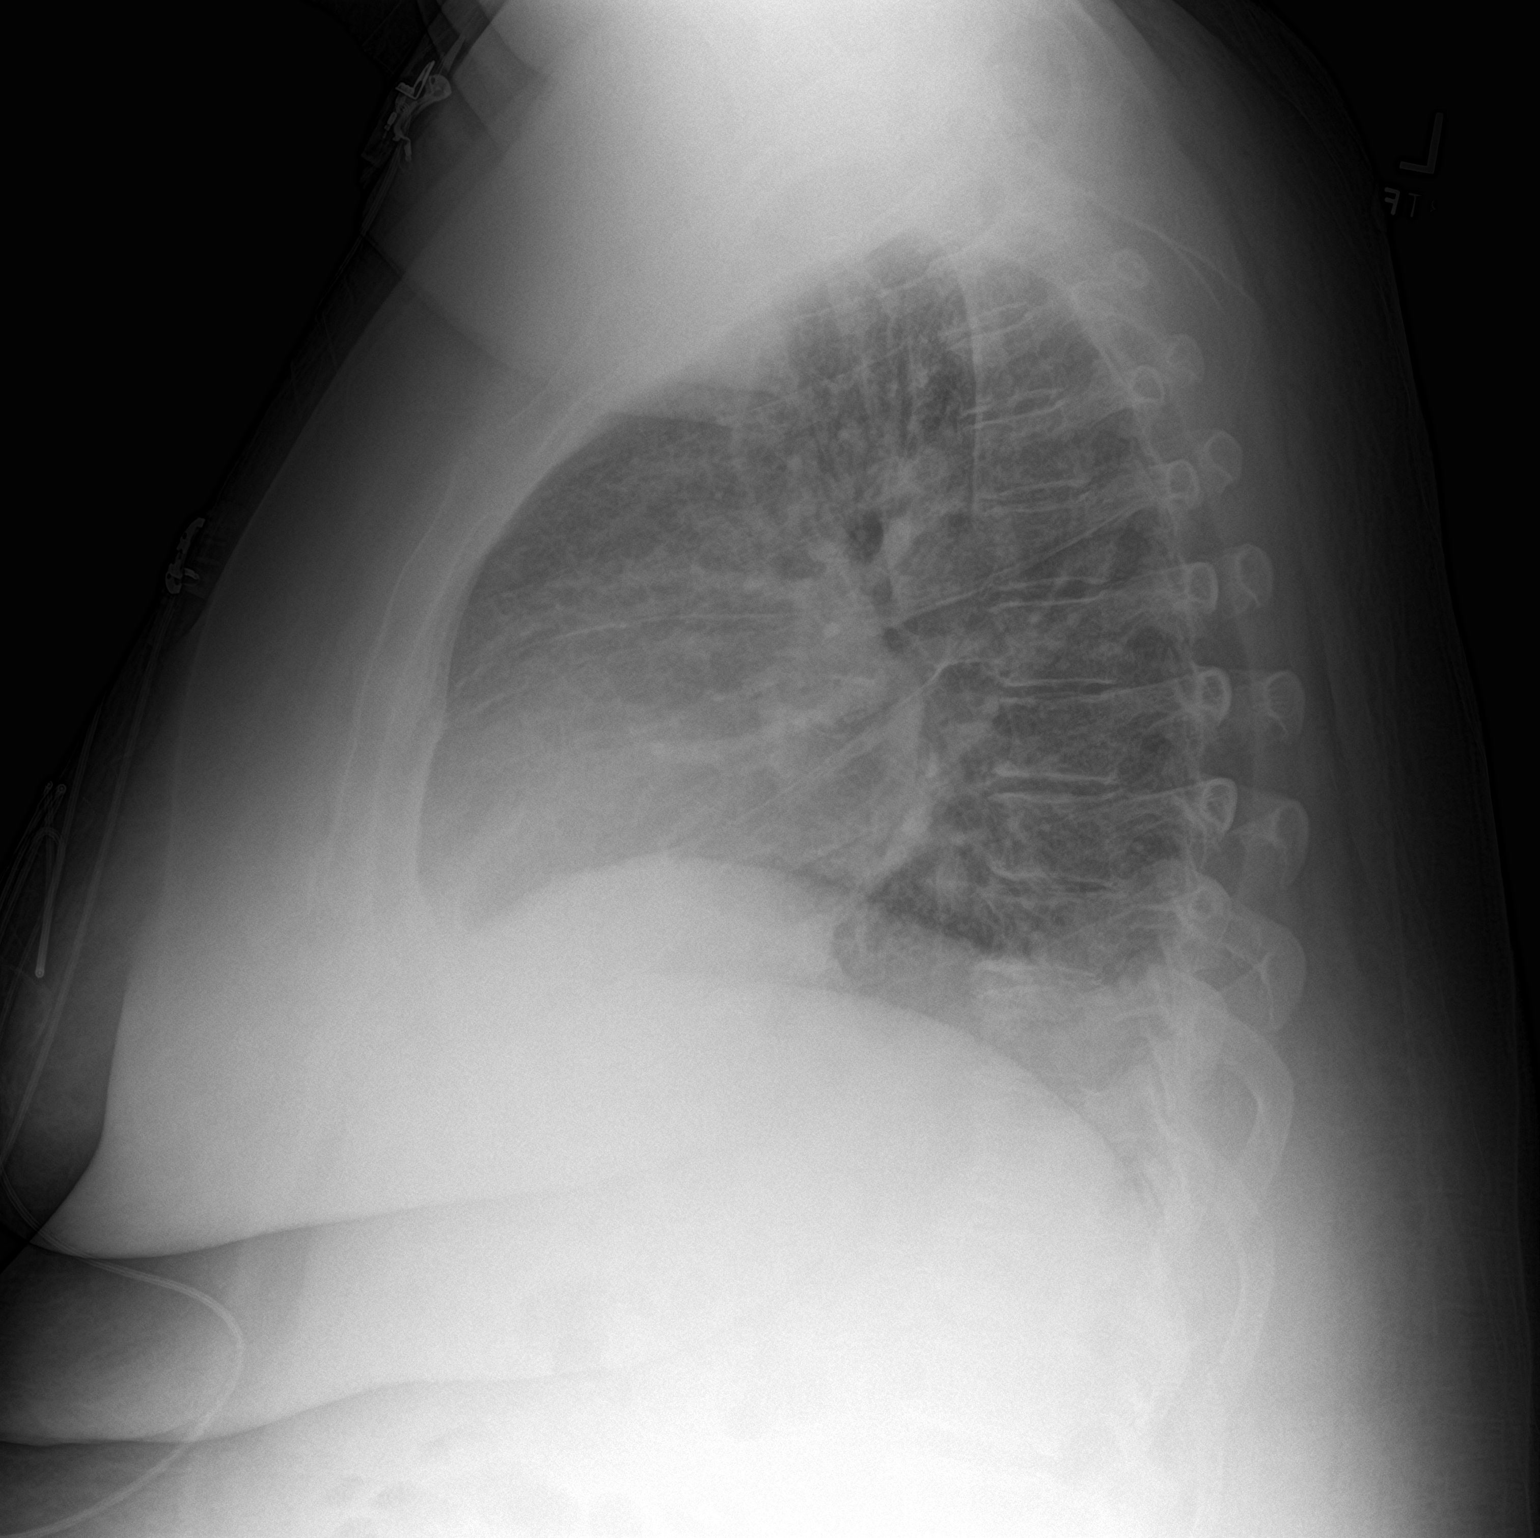

[2 of 2 positions shown; findings below may reference images not displayed]

FINDINGS: The heart size and mediastinal contours are stable. Compared with
the most recent study, there are mildly increased diffuse
interstitial opacities most consistent with edema. There is
associated fissural thickening and a stable small right pleural
effusion. No confluent airspace opacity or pneumothorax. The bones
appear unchanged. Telemetry leads overlie the chest.
IMPRESSION: Mildly increased diffuse interstitial opacities most consistent with
pulmonary edema.

## 2020-09-20 MED ORDER — FUROSEMIDE 10 MG/ML IJ SOLN
8.0000 mg/h | INTRAVENOUS | Status: DC
  Administered 2020-09-20: 12:00:00 5 mg/h via INTRAVENOUS
  Filled 2020-09-20 (×3): qty 20

## 2020-09-20 MED ORDER — POTASSIUM CHLORIDE CRYS ER 20 MEQ PO TBCR
40.0000 meq | EXTENDED_RELEASE_TABLET | Freq: Two times a day (BID) | ORAL | Status: DC
  Administered 2020-09-20 (×2): 40 meq via ORAL
  Filled 2020-09-20 (×2): qty 2

## 2020-09-20 MED ORDER — POTASSIUM CHLORIDE CRYS ER 20 MEQ PO TBCR
40.0000 meq | EXTENDED_RELEASE_TABLET | Freq: Once | ORAL | Status: AC
  Administered 2020-09-20: 40 meq via ORAL
  Filled 2020-09-20: qty 2

## 2020-09-20 MED ORDER — MAGNESIUM OXIDE 400 (241.3 MG) MG PO TABS
200.0000 mg | ORAL_TABLET | Freq: Every day | ORAL | Status: DC
  Administered 2020-09-20 – 2020-09-22 (×3): 200 mg via ORAL
  Filled 2020-09-20 (×3): qty 1

## 2020-09-20 NOTE — Progress Notes (Signed)
Progress Note  Patient Name: Lauren Osborn Date of Encounter: 09/20/2020  [facility] Cardiologist: Rozann Lesches, MD   Subjective   Feeling better, breathing is much easier and less tight in her chest.  Still with increased abdominal girth and leg swelling.  Inpatient Medications    Scheduled Meds: . furosemide  60 mg Intravenous BID  . heparin injection (subcutaneous)  5,000 Units Subcutaneous Q8H  . pantoprazole  40 mg Oral Daily     Vital Signs    Vitals:   09/19/20 0533 09/19/20 1429 09/19/20 2135 09/20/20 0452  BP: (!) 88/65 109/72 101/81 117/81  Pulse: 69 84 89 88  Resp: 19 20 20 20   Temp: 97.9 F (36.6 C) 98.2 F (36.8 C) (!) 97.5 F (36.4 C) 98.3 F (36.8 C)  TempSrc:  Oral Oral Oral  SpO2: 94% 97% 98% 93%  Weight: (!) 152.4 kg   (!) 151.6 kg  Height:        Intake/Output Summary (Last 24 hours) at 09/20/2020 0751 Last data filed at 09/20/2020 0500 Gross per 24 hour  Intake 120 ml  Output 2850 ml  Net -2730 ml   Last 3 Weights 09/20/2020 09/19/2020 09/18/2020  Weight (lbs) 334 lb 4.8 oz 335 lb 15.7 oz 339 lb 6.4 oz  Weight (kg) 151.637 kg 152.4 kg 153.951 kg      Telemetry    Sinus rhythm, burst of limited SVT on 1/16. Personally reviewed.  ECG    No tracing reviewed today.  Physical Exam   GEN: No acute distress.   Neck: No JVD Cardiac: RRR, no gallops.  Respiratory: Clear to auscultation bilaterally. GI:  Protuberant, nontender. MS:  Improving edema/lymphedema.  Labs    Chemistry Recent Labs  Lab 09/15/20 1548 09/16/20 0215 09/17/20 0612 09/18/20 0607 09/19/20 0500 09/20/20 0510  NA 139 139 141 139 139 140  K 3.4* 2.9* 2.7* 2.7* 2.8* 3.1*  CL 103 102 102 99 97* 96*  CO2 25 26 27 26 29 30   GLUCOSE 104* 100* 100* 96 93 95  BUN 20 20 19  21* 20 21*  CREATININE 1.48* 1.58* 1.46* 1.49* 1.60* 1.58*  CALCIUM 9.1 8.9 9.2 9.0 9.1 9.2  PROT 7.0 6.7  --   --   --   --   ALBUMIN 4.2 4.0 4.1  --  4.0 4.0  AST 26 26  --   --   --    --   ALT 16 14  --   --   --   --   ALKPHOS 51 49  --   --   --   --   BILITOT 2.4* 2.4*  --   --   --   --   GFRNONAA 44* 41* 45* 44* 40* 41*  ANIONGAP 11 11 12 14 13 14      Hematology Recent Labs  Lab 09/15/20 1548 09/16/20 0215  WBC 8.9 7.5  RBC 4.29 4.05  HGB 12.7 12.1  HCT 40.8 38.1  MCV 95.1 94.1  MCH 29.6 29.9  MCHC 31.1 31.8  RDW 17.1* 17.2*  PLT 248 247    BNP Recent Labs  Lab 09/15/20 1548  BNP 318.0*     DDimer No results for input(s): DDIMER in the last 168 hours.   Radiology    No results found.  Cardiac Studies   Echo 09/16/20 IMPRESSIONS    1. Left ventricular ejection fraction, by estimation, is 65 to 70%. The  left ventricle has normal function. The left  ventricle has no regional  wall motion abnormalities. Left ventricular diastolic parameters were  normal.   2. RV not well visualized. Grossly appears enlarged. Function appears to  be decreased but difficult to quantify. Intermittent septal bounce likely  secondary to RV strain, as there is no echocardiographic signs of  constrictive pericarditis (normal mitral   inflow, normal MV tissue Doppler). . Right ventricular systolic function  was not well visualized. The right ventricular size is not well  visualized.   3. The mitral valve is normal in structure. No evidence of mitral valve  regurgitation. No evidence of mitral stenosis.   4. The aortic valve was not well visualized. Aortic valve regurgitation  is not visualized. No aortic stenosis is present.   Patient Profile     45 y.o. female history of chronic diastolic HF and RV dysfunction, HTN, severe obesity BMI 63, presents with anasarca and SOB refractory to home oral diuretics. From notes 70 lbs weight gain since December 2021.  Assessment & Plan    1.  Acute on chronic diastolic heart failure with RV dysfunction and anasarca, improving with IV diuresis, 2700 cc out more than in last 24 hours.  Weight down approximately 20 pounds  with significant amount to go toward baseline.  2.  Acute renal insufficiency, creatinine holding in the range of 1.4-1.6 in the setting of IV diuresis.  3.  Hypokalemia in the setting of IV diuresis.  4.  Obesity, at risk for OHS/OSA which could be a contributor to RV dysfunction.  Plan to convert divided dose IV Lasix to Lasix infusion starting at 5 mg/h. Start standing KCl 40 mEq twice daily.  Also start magnesium supplement.  Follow-up urine output and BMET in a.m.  Satira Sark, M.D., F.A.C.C.

## 2020-09-20 NOTE — Progress Notes (Signed)
Patient Demographics:    Lauren Osborn, is a 45 y.o. female, DOB - May 17, 1976, IRJ:[id number]  Admit date - 09/15/2020   Admitting Physician Bernadette Hoit, DO  Outpatient Primary MD for the patient is Lauren Fire, MD  LOS - 4   Chief Complaint  Patient presents with  . Shortness of Breath        Subjective:    Lauren Osborn today has no fevers, no emesis,  No chest pain,    -Continues to void well, -Dyspnea on exertion improving   Assessment  & Plan :    Active Problems:   Pulmonary edema   Hypokalemia   Prolonged QT interval   Acute kidney injury (HCC)   Elevated brain natriuretic peptide (BNP) level   GERD (gastroesophageal reflux disease)   Anasarca   Super obese  Brief Summary:- 45 y.o. female with medical history significant for hypertension and chronic lower extremity edema, admitted on 09/15/20 with progressive dyspnea on exertion, abdominal distension and lower extremity edema since 06/2020.  Compliance with oral diuretics--- on admission weight up to 358 pounds from recent wt around 280 lb (a few months ago her weight was 351lb)  A/p 1)HFpEF--- patient admitted with acute on chronic diastolic dysfunction CHF, repeat echo with preserved EF around 70% -BNP 318 but this may be underestimated in obese patients with diastolic dysfunction -Chest x-ray and clinical exam supports overload status/pulmonary edema -Failed oral diuretics as outpatient PTA , daily weights, fluid input and output charting -Weight appears to be down to 334 pounds from 358 pounds -Fluid balance negative  09/20/20 --Cardiologist recommended changing from bolus Lasix to IV Lasix drip at 5 mg/h continuously  -DOE-improving  2)AKI----acute kidney injury--- due to aggressive diuresis -GFR is greater than 60 and creatinine was 1.0 on 08/13/2020 -Creatinine is around 1.6 which was the same on admission -monitor  closely with diuresis, --09/20/20-- iv Lasix drip started -- renally adjust medications, avoid nephrotoxic agents / dehydration  / hypotension  3)Hypokalemia--- magnesium WNL, replace and monitor closely with aggressive diuresis -Started on scheduled potassium replacement in addition to as needed potassium replacements  4)Morbid Obesity- -Low calorie diet, portion control and increase physical activity discussed with patient -Body mass index is 59.22 kg/m.  -Patient admits to-Excessive Pepsi/soda intake--- she appears to get at least 1500 cal from just soda per day  5)H/o Prior partial thyroidectomy--- TSH WNL  Disposition/Need for in-Hospital Stay- patient unable to be discharged at this time due to --- very significant volume overload/pulmonary edema, failed oral diuretics needs IV diuretics and monitoring of electrolytes and renal function while aggressively diuresing  Status is: Inpatient  Remains inpatient appropriate because:Please see below   Disposition: The patient is from: Home              Anticipated d/c is to: Home              Anticipated d/c date is: 1 day              Patient currently is not medically stable to d/c. Barriers: Not Clinically Stable-   Code Status :  -  Code Status: Full Code   Family Communication:    NA (patient is alert, awake and coherent)   Consults  :  card  DVT Prophylaxis  :   - SCDs   heparin injection 5,000 Units Start: 09/16/20 1400 SCDs Start: 09/16/20 0137    Lab Results  Component Value Date   PLT 247 09/16/2020    Inpatient Medications  Scheduled Meds: . heparin injection (subcutaneous)  5,000 Units Subcutaneous Q8H  . magnesium oxide  200 mg Oral Daily  . pantoprazole  40 mg Oral Daily  . potassium chloride  40 mEq Oral BID  . potassium chloride  40 mEq Oral Once   Continuous Infusions: . furosemide (LASIX) 200 mg in dextrose 5% 100 mL (2mg /mL) infusion 5 mg/hr (09/20/20 1208)   PRN Meds:.    Anti-infectives  (From admission, onward)   None        Objective:   Vitals:   09/19/20 1429 09/19/20 2135 09/20/20 0452 09/20/20 1443  BP: 109/72 101/81 117/81 106/76  Pulse: 84 89 88 88  Resp: 20 20 20 19   Temp: 98.2 F (36.8 C) (!) 97.5 F (36.4 C) 98.3 F (36.8 C) 98.5 F (36.9 C)  TempSrc: Oral Oral Oral Oral  SpO2: 97% 98% 93% 97%  Weight:   (!) 151.6 kg   Height:        Wt Readings from Last 3 Encounters:  09/20/20 (!) 151.6 kg  09/09/20 (!) 159.2 kg  08/13/20 129.7 kg     Intake/Output Summary (Last 24 hours) at 09/20/2020 1648 Last data filed at 09/20/2020 1600 Gross per 24 hour  Intake 128.85 ml  Output 2150 ml  Net -2021.15 ml     Physical Exam  Gen:- Awake Alert, morbidly obese, no conversational dyspnea HEENT:- [place].AT, No sclera icterus Neck-Supple Neck, Lungs-improving air movement, no wheezing CV- S1, S2 normal, regular  Abd-  +ve B.Sounds, Abd Soft, No tenderness, increased truncal adiposity    Extremity/Skin:-Improving bilateral lower extremity edema, pedal pulses present  Psych-affect is appropriate, oriented x3 Neuro-generalized weakness no new focal deficits, no tremors   Data Review:   Micro Results Recent Results (from the past 240 hour(s))  Resp Panel by RT-PCR (Flu A&B, Covid) Nasopharyngeal Swab     Status: None   Collection Time: 09/15/20 11:01 PM   Specimen: Nasopharyngeal Swab; Nasopharyngeal(NP) swabs in vial transport medium  Result Value Ref Range Status   SARS Coronavirus 2 by RT PCR NEGATIVE NEGATIVE Final    Comment: (NOTE) SARS-CoV-2 target nucleic acids are NOT DETECTED.  The SARS-CoV-2 RNA is generally detectable in upper respiratory specimens during the acute phase of infection. The lowest concentration of SARS-CoV-2 viral copies this assay can detect is 138 copies/mL. A negative result does not preclude SARS-Cov-2 infection and should not be used as the sole basis for treatment or other patient management decisions. A negative  result may occur with  improper specimen collection/handling, submission of specimen other than nasopharyngeal swab, presence of viral mutation(s) within the areas targeted by this assay, and inadequate number of viral copies(<138 copies/mL). A negative result must be combined with clinical observations, patient history, and epidemiological information. The expected result is Negative.  Fact Sheet for Patients:  EntrepreneurPulse.com.au  Fact Sheet for Healthcare Providers:  IncredibleEmployment.be  This test is no t yet approved or cleared by the [place] FDA and  has been authorized for detection and/or diagnosis of SARS-CoV-2 by FDA under an Emergency Use Authorization (EUA). This EUA will remain  in effect (meaning this test can be used) for the duration of the COVID-19 declaration under Section 564(b)(1) of the Act, 21 U.S.C.section 360bbb-3(b)(1),  unless the authorization is terminated  or revoked sooner.       Influenza A by PCR NEGATIVE NEGATIVE Final   Influenza B by PCR NEGATIVE NEGATIVE Final    Comment: (NOTE) The Xpert Xpress SARS-CoV-2/FLU/RSV plus assay is intended as an aid in the diagnosis of influenza from Nasopharyngeal swab specimens and should not be used as a sole basis for treatment. Nasal washings and aspirates are unacceptable for Xpert Xpress SARS-CoV-2/FLU/RSV testing.  Fact Sheet for Patients: EntrepreneurPulse.com.au  Fact Sheet for Healthcare Providers: IncredibleEmployment.be  This test is not yet approved or cleared by the [place] FDA and has been authorized for detection and/or diagnosis of SARS-CoV-2 by FDA under an Emergency Use Authorization (EUA). This EUA will remain in effect (meaning this test can be used) for the duration of the COVID-19 declaration under Section 564(b)(1) of the Act, 21 U.S.C. section 360bbb-3(b)(1), unless the authorization is  terminated or revoked.  Performed at [facility], [address], [place], [place] [address]     Radiology Reports DG Chest 2 View  Result Date: 09/20/2020 CLINICAL DATA:  Persistent fluid retention over the last 3 months with weight gain and intermittent shortness of breath. Former smoker. EXAM: CHEST - 2 VIEW COMPARISON:  Radiographs 09/17/2020 and 09/15/2020. FINDINGS: The heart size and mediastinal contours are stable. Compared with the most recent study, there are mildly increased diffuse interstitial opacities most consistent with edema. There is associated fissural thickening and a stable small right pleural effusion. No confluent airspace opacity or pneumothorax. The bones appear unchanged. Telemetry leads overlie the chest. IMPRESSION: Mildly increased diffuse interstitial opacities most consistent with pulmonary edema. Electronically Signed   By: Richardean Sale M.D.   On: 09/20/2020 08:45   DG Chest 2 View  Result Date: 09/17/2020 CLINICAL DATA:  Shortness of breath, fluid retention EXAM: CHEST - 2 VIEW COMPARISON:  09/15/2020 FINDINGS: No significant change in chest radiographs with mild, diffuse interstitial opacity and a small right pleural effusion. No new or focal airspace opacity. The heart and mediastinum are unremarkable. Mild disc degenerative disease of the thoracic spine. IMPRESSION: No significant change in chest radiographs with mild, diffuse interstitial opacity and a small right pleural effusion, likely edema. No new or focal airspace opacity. Electronically Signed   By: Eddie Candle M.D.   On: 09/17/2020 09:00   DG Chest 2 View  Result Date: 09/15/2020 CLINICAL DATA:  Increasing shortness of breath and wheezing for 1 month. EXAM: CHEST - 2 VIEW COMPARISON:  Single-view of the chest 02/06/2020. FINDINGS: The patient has a small right pleural effusion. There is pulmonary edema. No consolidative process or pneumothorax. Heart size is upper normal. IMPRESSION: Pulmonary edema  with a small right pleural effusion. Electronically Signed   By: Inge Rise M.D.   On: 09/15/2020 14:25   ECHOCARDIOGRAM COMPLETE  Result Date: 09/16/2020    ECHOCARDIOGRAM REPORT   Patient Name:   MEKISHA BITTEL Date of Exam: 09/16/2020 Medical Rec #:  [id number]      Height:       63.0 in Accession #:    [id number]     Weight:       358.0 lb Date of Birth:  Apr 03, 1976      BSA:          2.477 m Patient Age:    24 years       BP:           113/82 mmHg Patient Gender: F  HR:           89 bpm. Exam Location:  [facility] Procedure: 2D Echo Indications:     Dyspnea R06.00  History:         Patient has prior history of Echocardiogram examinations, most                  recent 03/18/2020. Risk Factors:Former Smoker. Acute Kidney                  Injury, GERD, Prolonged QT interval.  Sonographer:     Leavy Cella RDCS (AE) Referring Phys:  [id number] Tobe Sos ADEFESO Diagnosing Phys: Carlyle Dolly MD IMPRESSIONS  1. Left ventricular ejection fraction, by estimation, is 65 to 70%. The left ventricle has normal function. The left ventricle has no regional wall motion abnormalities. Left ventricular diastolic parameters were normal.  2. RV not well visualized. Grossly appears enlarged. Function appears to be decreased but difficult to quantify. Intermittent septal bounce likely secondary to RV strain, as there is no echocardiographic signs of constrictive pericarditis (normal mitral  inflow, normal MV tissue Doppler). . Right ventricular systolic function was not well visualized. The right ventricular size is not well visualized.  3. The mitral valve is normal in structure. No evidence of mitral valve regurgitation. No evidence of mitral stenosis.  4. The aortic valve was not well visualized. Aortic valve regurgitation is not visualized. No aortic stenosis is present. FINDINGS  Left Ventricle: Left ventricular ejection fraction, by estimation, is 65 to 70%. The left ventricle has normal function. The left  ventricle has no regional wall motion abnormalities. Definity contrast agent was given IV to delineate the left ventricular  endocardial borders. The left ventricular internal cavity size was normal in size. There is no left ventricular hypertrophy. Left ventricular diastolic parameters were normal. Right Ventricle: RV not well visualized. Grossly appears enlarged. Function appears to be decreased but difficult to quantify. Intermittent septal bounce likely secondary to RV strain, as there is no echocardiographic signs of constrictive pericarditis (normal mitral inflow, normal MV tissue Doppler). The right ventricular size is not well visualized. Right vetricular wall thickness was not well visualized. Right ventricular systolic function was not well visualized. Left Atrium: Left atrial size was normal in size. Right Atrium: Right atrial size was not well visualized. Pericardium: There is no evidence of pericardial effusion. Mitral Valve: The mitral valve is normal in structure. No evidence of mitral valve regurgitation. No evidence of mitral valve stenosis. Tricuspid Valve: The tricuspid valve is not well visualized. Tricuspid valve regurgitation is trivial. No evidence of tricuspid stenosis. Aortic Valve: The aortic valve was not well visualized. Aortic valve regurgitation is not visualized. No aortic stenosis is present. Aortic valve mean gradient measures 3.0 mmHg. Aortic valve peak gradient measures 6.2 mmHg. Aortic valve area, by VTI measures 2.54 cm. Pulmonic Valve: The pulmonic valve was not well visualized. Pulmonic valve regurgitation is not visualized. No evidence of pulmonic stenosis. Aorta: The aortic root is normal in size and structure. Venous: The inferior vena cava was not well visualized. IAS/Shunts: The interatrial septum was not well visualized.  LEFT VENTRICLE PLAX 2D LVIDd:         4.25 cm  Diastology LVIDs:         2.60 cm  LV e' medial:    10.40 cm/s LV PW:         0.90 cm  LV E/e' medial:   10.4 LV IVS:  0.82 cm  LV e' lateral:   11.50 cm/s LVOT diam:     1.80 cm  LV E/e' lateral: 9.4 LV SV:         53 LV SV Index:   21 LVOT Area:     2.54 cm  RIGHT VENTRICLE TAPSE (M-mode): 1.6 cm LEFT ATRIUM             Index       RIGHT ATRIUM           Index LA diam:        3.50 cm 1.41 cm/m  RA Area:     14.70 cm LA Vol (A2C):   59.4 ml 23.99 ml/m RA Volume:   40.40 ml  16.31 ml/m LA Vol (A4C):   31.9 ml 12.88 ml/m LA Biplane Vol: 44.2 ml 17.85 ml/m  AORTIC VALVE AV Area (Vmax):    2.21 cm AV Area (Vmean):   2.24 cm AV Area (VTI):     2.54 cm AV Vmax:           124.23 cm/s AV Vmean:          79.476 cm/s AV VTI:            0.207 m AV Peak Grad:      6.2 mmHg AV Mean Grad:      3.0 mmHg LVOT Vmax:         107.75 cm/s LVOT Vmean:        69.931 cm/s LVOT VTI:          0.207 m LVOT/AV VTI ratio: 1.00  AORTA Ao Root diam: 2.80 cm MITRAL VALVE                TRICUSPID VALVE MV Area (PHT): 3.76 cm     TR Peak grad:   15.1 mmHg MV Decel Time: 202 msec     TR Vmax:        194.00 cm/s MV E velocity: 108.00 cm/s MV A velocity: 58.00 cm/s   SHUNTS MV E/A ratio:  1.86         Systemic VTI:  0.21 m                             Systemic Diam: 1.80 cm Carlyle Dolly MD Electronically signed by Carlyle Dolly MD Signature Date/Time: 09/16/2020/11:16:17 AM    Final (Updated)    US Abdomen Limited RUQ (LIVER/GB)  Result Date: 09/08/2020 CLINICAL DATA:  Abnormal LFTs for 6 months, history hypertension EXAM: ULTRASOUND ABDOMEN LIMITED RIGHT UPPER QUADRANT COMPARISON:  None FINDINGS: Gallbladder: Suboptimally assessed due to body habitus. Large shadowing echogenic focus seen within gallbladder 5.3 cm length likely a large gallstone versus less likely multiple tiny calculi. Mildly thickened gallbladder wall. No pericholecystic fluid. No sonographic Murphy sign. Common bile duct: Diameter: 4 mm Liver: Normal parenchymal echogenicity. Hyperechoic mass RIGHT lobe 3.4 x 3.8 x 2.7 cm. No additional masses identified.  Portal vein is patent on color Doppler imaging with normal direction of blood flow towards the liver. Other: No RIGHT upper quadrant free fluid. IMPRESSION: Cholelithiasis with probable large single calculus 5.3 cm diameter versus less likely multiple tiny dependent calculi within gallbladder. No biliary dilatation. Hyperechoic mass RIGHT lobe liver 3.8 cm in size; finding is nonspecific, could potentially represent a hepatic hemangioma but is not diagnostic and other etiologies are not excluded; further characterization by MR imaging with and without contrast recommended. Electronically Signed  By: Lavonia Dana M.D.   On: 09/08/2020 10:23     CBC Recent Labs  Lab 09/15/20 1548 09/16/20 0215  WBC 8.9 7.5  HGB 12.7 12.1  HCT 40.8 38.1  PLT 248 247  MCV 95.1 94.1  MCH 29.6 29.9  MCHC 31.1 31.8  RDW 17.1* 17.2*    Chemistries  Recent Labs  Lab 09/15/20 1548 09/16/20 0215 09/17/20 0612 09/18/20 0607 09/19/20 0500 09/20/20 0510  NA 139 139 141 139 139 140  K 3.4* 2.9* 2.7* 2.7* 2.8* 3.1*  CL 103 102 102 99 97* 96*  CO2 25 26 27 26 29 30   GLUCOSE 104* 100* 100* 96 93 95  BUN 20 20 19  21* 20 21*  CREATININE 1.48* 1.58* 1.46* 1.49* 1.60* 1.58*  CALCIUM 9.1 8.9 9.2 9.0 9.1 9.2  MG  --  1.8  --  1.7  --  1.8  AST 26 26  --   --   --   --   ALT 16 14  --   --   --   --   ALKPHOS 51 49  --   --   --   --   BILITOT 2.4* 2.4*  --   --   --   --    ------------------------------------------------------------------------------------------------------------------ No results for input(s): CHOL, HDL, LDLCALC, TRIG, CHOLHDL, LDLDIRECT in the last 72 hours.  No results found for: HGBA1C ------------------------------------------------------------------------------------------------------------------ No results for input(s): TSH, T4TOTAL, T3FREE, THYROIDAB in the last 72 hours.  Invalid input(s):  FREET3 ------------------------------------------------------------------------------------------------------------------ No results for input(s): VITAMINB12, FOLATE, FERRITIN, TIBC, IRON, RETICCTPCT in the last 72 hours.  Coagulation profile Recent Labs  Lab 09/16/20 0215  INR 1.3*    No results for input(s): DDIMER in the last 72 hours.  Cardiac Enzymes No results for input(s): CKMB, TROPONINI, MYOGLOBIN in the last 168 hours.  Invalid input(s): CK ------------------------------------------------------------------------------------------------------------------    Component Value Date/Time   BNP 318.0 (H) 09/15/2020 1548    Roxan Hockey M.D on 09/20/2020 at 4:48 PM  Go to [email or web address] - for contact info  [facility] - Office  [phone]

## 2020-09-21 DIAGNOSIS — N179 Acute kidney failure, unspecified: Secondary | ICD-10-CM | POA: Diagnosis not present

## 2020-09-21 DIAGNOSIS — R601 Generalized edema: Secondary | ICD-10-CM | POA: Diagnosis not present

## 2020-09-21 LAB — RENAL FUNCTION PANEL
Albumin: 3.9 g/dL (ref 3.5–5.0)
Anion gap: 13 (ref 5–15)
BUN: 22 mg/dL — ABNORMAL HIGH (ref 6–20)
CO2: 31 mmol/L (ref 22–32)
Calcium: 9.2 mg/dL (ref 8.9–10.3)
Chloride: 95 mmol/L — ABNORMAL LOW (ref 98–111)
Creatinine, Ser: 1.55 mg/dL — ABNORMAL HIGH (ref 0.44–1.00)
GFR, Estimated: 42 mL/min — ABNORMAL LOW (ref >60)
Glucose, Bld: 95 mg/dL (ref 70–99)
Phosphorus: 3.7 mg/dL (ref 2.5–4.6)
Potassium: 3 mmol/L — ABNORMAL LOW (ref 3.5–5.1)
Sodium: 139 mmol/L (ref 135–145)

## 2020-09-21 MED ORDER — POTASSIUM CHLORIDE CRYS ER 20 MEQ PO TBCR: 40.0000 meq | EXTENDED_RELEASE_TABLET | Freq: Once | ORAL | Status: DC

## 2020-09-21 MED ORDER — POTASSIUM CHLORIDE CRYS ER 20 MEQ PO TBCR
40.0000 meq | EXTENDED_RELEASE_TABLET | Freq: Three times a day (TID) | ORAL | Status: DC
  Administered 2020-09-21 (×3): 40 meq via ORAL
  Filled 2020-09-21 (×4): qty 2

## 2020-09-21 NOTE — Progress Notes (Addendum)
Progress Note  Patient Name: Lauren Osborn Date of Encounter: 09/21/2020  Primary Cardiologist: Rozann Lesches, MD   Subjective   Reports her dyspnea on exertion has significantly improved. No chest pain or palpitations.  Inpatient Medications    Scheduled Meds: . heparin injection (subcutaneous)  5,000 Units Subcutaneous Q8H  . magnesium oxide  200 mg Oral Daily  . pantoprazole  40 mg Oral Daily  . potassium chloride  40 mEq Oral TID   Continuous Infusions: . furosemide (LASIX) 200 mg in dextrose 5% 100 mL (2mg /mL) infusion 8 mg/hr (09/21/20 1018)    Vital Signs    Vitals:   09/20/20 1951 09/20/20 2055 09/21/20 0540 09/21/20 0545  BP:  120/87 101/69   Pulse:  87 89   Resp:  18 19   Temp:  98.6 F (37 C) 98.2 F (36.8 C)   TempSrc:  Oral Oral   SpO2: 93% 98% 95%   Weight:    (!) 150.6 kg  Height:        Intake/Output Summary (Last 24 hours) at 09/21/2020 1020 Last data filed at 09/21/2020 0955 Gross per 24 hour  Intake 643.83 ml  Output 3750 ml  Net -3106.17 ml    Last 3 Weights 09/21/2020 09/20/2020 09/19/2020  Weight (lbs) 332 lb 1.6 oz 334 lb 4.8 oz 335 lb 15.7 oz  Weight (kg) 150.64 kg 151.637 kg 152.4 kg      Telemetry    NSR, HR in 80's to 90's. No significant arrhythmias. - Personally Reviewed  ECG    No new tracings.   Physical Exam   General: Obese female appearing in no acute distress. Head: Normocephalic, atraumatic.  Neck: Supple without bruits, JVD at 8 cm. Lungs:  Resp regular and unlabored, no wheezing or rales appreciated. Heart: RRR, S1, S2, no S3, S4, or murmur; no rub. Abdomen: Soft, non-tender. Appears distended.  Extremities: No clubbing or cyanosis, 2+ pitting edema bilaterally. Distal pedal pulses are 2+ bilaterally. Neuro: Alert and oriented X 3. Moves all extremities spontaneously. Psych: Normal affect.  Labs    Chemistry Recent Labs  Lab 09/15/20 1548 09/16/20 0215 09/17/20 0612 09/19/20 0500 09/20/20 0510  09/21/20 0552  NA 139 139   < > 139 140 139  K 3.4* 2.9*   < > 2.8* 3.1* 3.0*  CL 103 102   < > 97* 96* 95*  CO2 25 26   < > 29 30 31   GLUCOSE 104* 100*   < > 93 95 95  BUN 20 20   < > 20 21* 22*  CREATININE 1.48* 1.58*   < > 1.60* 1.58* 1.55*  CALCIUM 9.1 8.9   < > 9.1 9.2 9.2  PROT 7.0 6.7  --   --   --   --   ALBUMIN 4.2 4.0   < > 4.0 4.0 3.9  AST 26 26  --   --   --   --   ALT 16 14  --   --   --   --   ALKPHOS 51 49  --   --   --   --   BILITOT 2.4* 2.4*  --   --   --   --   GFRNONAA 44* 41*   < > 40* 41* 42*  ANIONGAP 11 11   < > 13 14 13    < > = values in this interval not displayed.     Hematology Recent Labs  Lab 09/15/20 1548 09/16/20 0215  WBC 8.9 7.5  RBC 4.29 4.05  HGB 12.7 12.1  HCT 40.8 38.1  MCV 95.1 94.1  MCH 29.6 29.9  MCHC 31.1 31.8  RDW 17.1* 17.2*  PLT 248 247    Cardiac EnzymesNo results for input(s): TROPONINI in the last 168 hours. No results for input(s): TROPIPOC in the last 168 hours.   BNP Recent Labs  Lab 09/15/20 1548  BNP 318.0*     Radiology    DG Chest 2 View  Result Date: 09/20/2020 CLINICAL DATA:  Persistent fluid retention over the last 3 months with weight gain and intermittent shortness of breath. Former smoker. EXAM: CHEST - 2 VIEW COMPARISON:  Radiographs 09/17/2020 and 09/15/2020. FINDINGS: The heart size and mediastinal contours are stable. Compared with the most recent study, there are mildly increased diffuse interstitial opacities most consistent with edema. There is associated fissural thickening and a stable small right pleural effusion. No confluent airspace opacity or pneumothorax. The bones appear unchanged. Telemetry leads overlie the chest. IMPRESSION: Mildly increased diffuse interstitial opacities most consistent with pulmonary edema. Electronically Signed   By: Richardean Sale M.D.   On: 09/20/2020 08:45    Cardiac Studies   Echocardiogram: 09/16/2020 IMPRESSIONS   1. Left ventricular ejection fraction,  by estimation, is 65 to 70%. The  left ventricle has normal function. The left ventricle has no regional  wall motion abnormalities. Left ventricular diastolic parameters were  normal.  2. RV not well visualized. Grossly appears enlarged. Function appears to  be decreased but difficult to quantify. Intermittent septal bounce likely  secondary to RV strain, as there is no echocardiographic signs of  constrictive pericarditis (normal mitral  inflow, normal MV tissue Doppler). . Right ventricular systolic function  was not well visualized. The right ventricular size is not well  visualized.  3. The mitral valve is normal in structure. No evidence of mitral valve  regurgitation. No evidence of mitral stenosis.  4. The aortic valve was not well visualized. Aortic valve regurgitation  is not visualized. No aortic stenosis is present.  Patient Profile     45 y.o. female w/ PMH of chronic diastolic CHF/RV dysfunction, HTN and GERD who is currently admitted for an acute CHF exacerbation with associated weight gain of 70 lbs since 08/2020.  Assessment & Plan    1. Acute on Chronic Diastolic CHF/RV Dysfunction - Reported a baseline weight of 280 - 290 lbs and was elevated to 358 lbs on admission. Repeat echo this admission shows a preserved EF of 65-70% and RV function appeared to be decreased.   - Was transitioned from IV Lasix 60mg  BID to a Lasix drip at 5 mg/hr yesterday as her weight has declined by 26 lbs but still at least 30 + lbs above baseline. She had a recorded output of -2.5 L yesterday with the Lasix drip and is overall -17.9L this admission. Reviewed with Dr. Domenic Polite and will increase her Lasix drip to 8 mg/hr given her stable renal function. Will eventually transition to Torsemide prior to discharge.    2. Stage 3 CKD - Baseline creatinine 1.0 - 1.1. At 1.44 on 09/09/2020 and has been variable this admission. Peaked at 1.60 on 09/19/2020, stable at 1.55 today.   3. Hypokalemia -  K+ remains low at 3.0 this AM. Will increase K-dur dosing from 40 mEq BID to TID dosing.   4. Obesity - She is at risk for OSA and an outpatient sleep study has been recommended.    For questions or  updates, please contact [facility] Please consult [email or web address] for contact info under Cardiology/STEMI.   Arna Medici , PA-C 10:20 AM 09/21/2020 Pager: [phone]   Attending note:  Patient seen and examined.  Reviewed interval hospital course and discussed case with Ms. Ahmed Prima PA-C, I agree with her above findings.  Ms. Knights continues to diuresis and renal function has been relatively stable, creatinine 1.55 this morning.  She is improving symptomatically, still at least 30 pounds over her anticipated baseline.  Plan is to increase Lasix drip to 8 mg/h, reassess renal function in the morning, and potentially make a switch to oral Demadex to see if she can diurese adequately on an oral regimen.  Satira Sark, M.D., F.A.C.C.

## 2020-09-21 NOTE — Progress Notes (Signed)
Patient Demographics:    Lauren Osborn, is a 45 y.o. female, DOB - 1976-05-25, VQM:[id number]  Admit date - 09/15/2020   Admitting Physician Bernadette Hoit, DO  Outpatient Primary MD for the patient is [name], MD  LOS - 5   Chief Complaint  Patient presents with  . Shortness of Breath        Subjective:    Lauren Osborn today has no fevers, no emesis,  No chest pain,    -Continues to void well, no dysuria, -Dyspnea on exertion is improving but not resolved   Assessment  & Plan :    Active Problems:   Pulmonary edema   Hypokalemia   Prolonged QT interval   Acute kidney injury (HCC)   Elevated brain natriuretic peptide (BNP) level   GERD (gastroesophageal reflux disease)   Anasarca   Super obese  Brief Summary:- 45 y.o. female with medical history significant for hypertension and chronic lower extremity edema, admitted on 09/15/20 with progressive dyspnea on exertion, abdominal distension and lower extremity edema since 06/2020.  Compliance with oral diuretics--- on admission weight up to 358 pounds from recent wt around 280 lb (a few months ago her weight was 351lb) -patient failed oral diuretics prior to admission, patient needs IV diuretics   A/p 1)HFpEF--- patient admitted with acute on chronic diastolic dysfunction CHF, repeat echo with preserved EF around 70% -BNP 318 but this may be underestimated in obese patients with diastolic dysfunction -Chest x-ray and clinical exam supports overload status/pulmonary edema -Failed oral diuretics as outpatient PTA , daily weights, fluid input and output charting -Weight appears to be down to 332.1 pounds from 358 pounds -Fluid balance negative  09/21/20 --Cardiologist recommended increasing IV Lasix drip to 8 mg/h from 5 mg/h continuously =--to try to help achieve better diuresis, patient is clear about 30 pounds over her  baseline  weight -DOE-improving  2)AKI----acute kidney injury--- due to aggressive diuresis -GFR is greater than 60 and creatinine was 1.0 on 08/13/2020 -Creatinine is around 1.6 which was the same on admission -monitor closely with diuresis, -on iv Lasix drip  -- renally adjust medications, avoid nephrotoxic agents / dehydration  / hypotension  3)Hypokalemia--- magnesium WNL, replace and monitor closely with aggressive diuresis -Started on scheduled potassium replacement in addition to as needed potassium replacements  4)Morbid Obesity- -Low calorie diet, portion control and increase physical activity discussed with patient -Body mass index is 58.83 kg/m.  -Patient admits to-Excessive Pepsi/soda intake--- she appears to get at least 1500 cal from just soda per day  --Will need outpatient study for possible sleep apnea -  5)H/o Prior partial thyroidectomy--- TSH WNL  Disposition/Need for in-Hospital Stay- patient unable to be discharged at this time due to --- very significant volume overload/pulmonary edema, failed oral diuretics needs IV diuretics and monitoring of electrolytes and renal function while aggressively diuresing (patient failed oral diuretics prior to admission)  Status is: Inpatient  Remains inpatient appropriate because:Please see below   Disposition: The patient is from: Home              Anticipated d/c is to: Home              Anticipated d/c date is: 1 day  Patient currently is not medically stable to d/c. Barriers: Not Clinically Stable-   Code Status :  -  Code Status: Full Code   Family Communication:    NA (patient is alert, awake and coherent)   Consults  :  card  DVT Prophylaxis  :   - SCDs   heparin injection 5,000 Units Start: 09/16/20 1400 SCDs Start: 09/16/20 0137    Lab Results  Component Value Date   PLT 247 09/16/2020    Inpatient Medications  Scheduled Meds: . heparin injection (subcutaneous)  5,000 Units Subcutaneous Q8H  .  magnesium oxide  200 mg Oral Daily  . pantoprazole  40 mg Oral Daily  . potassium chloride  40 mEq Oral TID   Continuous Infusions: . furosemide (LASIX) 200 mg in dextrose 5% 100 mL (2mg /mL) infusion 8 mg/hr (09/21/20 1018)   PRN Meds:.    Anti-infectives (From admission, onward)   None        Objective:   Vitals:   09/20/20 2055 09/21/20 0540 09/21/20 0545 09/21/20 1331  BP: 120/87 101/69  111/66  Pulse: 87 89  92  Resp: 18 19  18   Temp: 98.6 F (37 C) 98.2 F (36.8 C)  98.4 F (36.9 C)  TempSrc: Oral Oral  Oral  SpO2: 98% 95%  97%  Weight:   (!) 150.6 kg   Height:        Wt Readings from Last 3 Encounters:  09/21/20 (!) 150.6 kg  09/09/20 (!) 159.2 kg  08/13/20 129.7 kg     Intake/Output Summary (Last 24 hours) at 09/21/2020 1606 Last data filed at 09/21/2020 1455 Gross per 24 hour  Intake 1114.98 ml  Output 2300 ml  Net -1185.02 ml     Physical Exam  Gen:- Awake Alert, morbidly obese, no conversational dyspnea HEENT:- [place].AT, No sclera icterus Neck-Supple Neck, Lungs-improving air movement, no wheezing CV- S1, S2 normal, regular , DOE is not resolved Abd-  +ve B.Sounds, Abd Soft, No tenderness, increased truncal adiposity    Extremity/Skin:-Improving bilateral lower extremity edema (2+LE ), pedal pulses present  Psych-affect is appropriate, oriented x3 Neuro-generalized weakness no new focal deficits, no tremors   Data Review:   Micro Results Recent Results (from the past 240 hour(s))  Resp Panel by RT-PCR (Flu A&B, Covid) Nasopharyngeal Swab     Status: None   Collection Time: 09/15/20 11:01 PM   Specimen: Nasopharyngeal Swab; Nasopharyngeal(NP) swabs in vial transport medium  Result Value Ref Range Status   SARS Coronavirus 2 by RT PCR NEGATIVE NEGATIVE Final    Comment: (NOTE) SARS-CoV-2 target nucleic acids are NOT DETECTED.  The SARS-CoV-2 RNA is generally detectable in upper respiratory specimens during the acute phase of infection. The  lowest concentration of SARS-CoV-2 viral copies this assay can detect is 138 copies/mL. A negative result does not preclude SARS-Cov-2 infection and should not be used as the sole basis for treatment or other patient management decisions. A negative result may occur with  improper specimen collection/handling, submission of specimen other than nasopharyngeal swab, presence of viral mutation(s) within the areas targeted by this assay, and inadequate number of viral copies(<138 copies/mL). A negative result must be combined with clinical observations, patient history, and epidemiological information. The expected result is Negative.  Fact Sheet for Patients:  EntrepreneurPulse.com.au  Fact Sheet for Healthcare Providers:  IncredibleEmployment.be  This test is no t yet approved or cleared by the [place] FDA and  has been authorized for detection and/or diagnosis of SARS-CoV-2  by FDA under an Emergency Use Authorization (EUA). This EUA will remain  in effect (meaning this test can be used) for the duration of the COVID-19 declaration under Section 564(b)(1) of the Act, 21 U.S.C.section 360bbb-3(b)(1), unless the authorization is terminated  or revoked sooner.       Influenza A by PCR NEGATIVE NEGATIVE Final   Influenza B by PCR NEGATIVE NEGATIVE Final    Comment: (NOTE) The Xpert Xpress SARS-CoV-2/FLU/RSV plus assay is intended as an aid in the diagnosis of influenza from Nasopharyngeal swab specimens and should not be used as a sole basis for treatment. Nasal washings and aspirates are unacceptable for Xpert Xpress SARS-CoV-2/FLU/RSV testing.  Fact Sheet for Patients: EntrepreneurPulse.com.au  Fact Sheet for Healthcare Providers: IncredibleEmployment.be  This test is not yet approved or cleared by the [place] FDA and has been authorized for detection and/or diagnosis of SARS-CoV-2 by FDA under  an Emergency Use Authorization (EUA). This EUA will remain in effect (meaning this test can be used) for the duration of the COVID-19 declaration under Section 564(b)(1) of the Act, 21 U.S.C. section 360bbb-3(b)(1), unless the authorization is terminated or revoked.  Performed at [facility], [address], [place], [place] [address]     Radiology Reports DG Chest 2 View  Result Date: 09/20/2020 CLINICAL DATA:  Persistent fluid retention over the last 3 months with weight gain and intermittent shortness of breath. Former smoker. EXAM: CHEST - 2 VIEW COMPARISON:  Radiographs 09/17/2020 and 09/15/2020. FINDINGS: The heart size and mediastinal contours are stable. Compared with the most recent study, there are mildly increased diffuse interstitial opacities most consistent with edema. There is associated fissural thickening and a stable small right pleural effusion. No confluent airspace opacity or pneumothorax. The bones appear unchanged. Telemetry leads overlie the chest. IMPRESSION: Mildly increased diffuse interstitial opacities most consistent with pulmonary edema. Electronically Signed   By: Richardean Sale M.D.   On: 09/20/2020 08:45   DG Chest 2 View  Result Date: 09/17/2020 CLINICAL DATA:  Shortness of breath, fluid retention EXAM: CHEST - 2 VIEW COMPARISON:  09/15/2020 FINDINGS: No significant change in chest radiographs with mild, diffuse interstitial opacity and a small right pleural effusion. No new or focal airspace opacity. The heart and mediastinum are unremarkable. Mild disc degenerative disease of the thoracic spine. IMPRESSION: No significant change in chest radiographs with mild, diffuse interstitial opacity and a small right pleural effusion, likely edema. No new or focal airspace opacity. Electronically Signed   By: Eddie Candle M.D.   On: 09/17/2020 09:00   DG Chest 2 View  Result Date: 09/15/2020 CLINICAL DATA:  Increasing shortness of breath and wheezing for 1 month. EXAM:  CHEST - 2 VIEW COMPARISON:  Single-view of the chest 02/06/2020. FINDINGS: The patient has a small right pleural effusion. There is pulmonary edema. No consolidative process or pneumothorax. Heart size is upper normal. IMPRESSION: Pulmonary edema with a small right pleural effusion. Electronically Signed   By: Inge Rise M.D.   On: 09/15/2020 14:25   ECHOCARDIOGRAM COMPLETE  Result Date: 09/16/2020    ECHOCARDIOGRAM REPORT   Patient Name:   Lauren Osborn Date of Exam: 09/16/2020 Medical Rec #:  [id number]      Height:       63.0 in Accession #:    [id number]     Weight:       358.0 lb Date of Birth:  Jan 31, 1976      BSA:  2.477 m Patient Age:    38 years       BP:           113/82 mmHg Patient Gender: F              HR:           89 bpm. Exam Location:  [facility] Procedure: 2D Echo Indications:     Dyspnea R06.00  History:         Patient has prior history of Echocardiogram examinations, most                  recent 03/18/2020. Risk Factors:Former Smoker. Acute Kidney                  Injury, GERD, Prolonged QT interval.  Sonographer:     Leavy Cella RDCS (AE) Referring Phys:  [id number] Tobe Sos ADEFESO Diagnosing Phys: Carlyle Dolly MD IMPRESSIONS  1. Left ventricular ejection fraction, by estimation, is 65 to 70%. The left ventricle has normal function. The left ventricle has no regional wall motion abnormalities. Left ventricular diastolic parameters were normal.  2. RV not well visualized. Grossly appears enlarged. Function appears to be decreased but difficult to quantify. Intermittent septal bounce likely secondary to RV strain, as there is no echocardiographic signs of constrictive pericarditis (normal mitral  inflow, normal MV tissue Doppler). . Right ventricular systolic function was not well visualized. The right ventricular size is not well visualized.  3. The mitral valve is normal in structure. No evidence of mitral valve regurgitation. No evidence of mitral stenosis.  4. The aortic  valve was not well visualized. Aortic valve regurgitation is not visualized. No aortic stenosis is present. FINDINGS  Left Ventricle: Left ventricular ejection fraction, by estimation, is 65 to 70%. The left ventricle has normal function. The left ventricle has no regional wall motion abnormalities. Definity contrast agent was given IV to delineate the left ventricular  endocardial borders. The left ventricular internal cavity size was normal in size. There is no left ventricular hypertrophy. Left ventricular diastolic parameters were normal. Right Ventricle: RV not well visualized. Grossly appears enlarged. Function appears to be decreased but difficult to quantify. Intermittent septal bounce likely secondary to RV strain, as there is no echocardiographic signs of constrictive pericarditis (normal mitral inflow, normal MV tissue Doppler). The right ventricular size is not well visualized. Right vetricular wall thickness was not well visualized. Right ventricular systolic function was not well visualized. Left Atrium: Left atrial size was normal in size. Right Atrium: Right atrial size was not well visualized. Pericardium: There is no evidence of pericardial effusion. Mitral Valve: The mitral valve is normal in structure. No evidence of mitral valve regurgitation. No evidence of mitral valve stenosis. Tricuspid Valve: The tricuspid valve is not well visualized. Tricuspid valve regurgitation is trivial. No evidence of tricuspid stenosis. Aortic Valve: The aortic valve was not well visualized. Aortic valve regurgitation is not visualized. No aortic stenosis is present. Aortic valve mean gradient measures 3.0 mmHg. Aortic valve peak gradient measures 6.2 mmHg. Aortic valve area, by VTI measures 2.54 cm. Pulmonic Valve: The pulmonic valve was not well visualized. Pulmonic valve regurgitation is not visualized. No evidence of pulmonic stenosis. Aorta: The aortic root is normal in size and structure. Venous: The inferior  vena cava was not well visualized. IAS/Shunts: The interatrial septum was not well visualized.  LEFT VENTRICLE PLAX 2D LVIDd:         4.25 cm  Diastology LVIDs:  2.60 cm  LV e' medial:    10.40 cm/s LV PW:         0.90 cm  LV E/e' medial:  10.4 LV IVS:        0.82 cm  LV e' lateral:   11.50 cm/s LVOT diam:     1.80 cm  LV E/e' lateral: 9.4 LV SV:         53 LV SV Index:   21 LVOT Area:     2.54 cm  RIGHT VENTRICLE TAPSE (M-mode): 1.6 cm LEFT ATRIUM             Index       RIGHT ATRIUM           Index LA diam:        3.50 cm 1.41 cm/m  RA Area:     14.70 cm LA Vol (A2C):   59.4 ml 23.99 ml/m RA Volume:   40.40 ml  16.31 ml/m LA Vol (A4C):   31.9 ml 12.88 ml/m LA Biplane Vol: 44.2 ml 17.85 ml/m  AORTIC VALVE AV Area (Vmax):    2.21 cm AV Area (Vmean):   2.24 cm AV Area (VTI):     2.54 cm AV Vmax:           124.23 cm/s AV Vmean:          79.476 cm/s AV VTI:            0.207 m AV Peak Grad:      6.2 mmHg AV Mean Grad:      3.0 mmHg LVOT Vmax:         107.75 cm/s LVOT Vmean:        69.931 cm/s LVOT VTI:          0.207 m LVOT/AV VTI ratio: 1.00  AORTA Ao Root diam: 2.80 cm MITRAL VALVE                TRICUSPID VALVE MV Area (PHT): 3.76 cm     TR Peak grad:   15.1 mmHg MV Decel Time: 202 msec     TR Vmax:        194.00 cm/s MV E velocity: 108.00 cm/s MV A velocity: 58.00 cm/s   SHUNTS MV E/A ratio:  1.86         Systemic VTI:  0.21 m                             Systemic Diam: 1.80 cm Carlyle Dolly MD Electronically signed by Carlyle Dolly MD Signature Date/Time: 09/16/2020/11:16:17 AM    Final (Updated)    US Abdomen Limited RUQ (LIVER/GB)  Result Date: 09/08/2020 CLINICAL DATA:  Abnormal LFTs for 6 months, history hypertension EXAM: ULTRASOUND ABDOMEN LIMITED RIGHT UPPER QUADRANT COMPARISON:  None FINDINGS: Gallbladder: Suboptimally assessed due to body habitus. Large shadowing echogenic focus seen within gallbladder 5.3 cm length likely a large gallstone versus less likely multiple tiny calculi.  Mildly thickened gallbladder wall. No pericholecystic fluid. No sonographic Murphy sign. Common bile duct: Diameter: 4 mm Liver: Normal parenchymal echogenicity. Hyperechoic mass RIGHT lobe 3.4 x 3.8 x 2.7 cm. No additional masses identified. Portal vein is patent on color Doppler imaging with normal direction of blood flow towards the liver. Other: No RIGHT upper quadrant free fluid. IMPRESSION: Cholelithiasis with probable large single calculus 5.3 cm diameter versus less likely multiple tiny dependent calculi within gallbladder. No biliary dilatation. Hyperechoic mass RIGHT  lobe liver 3.8 cm in size; finding is nonspecific, could potentially represent a hepatic hemangioma but is not diagnostic and other etiologies are not excluded; further characterization by MR imaging with and without contrast recommended. Electronically Signed   By: Lavonia Dana M.D.   On: 09/08/2020 10:23     CBC Recent Labs  Lab 09/15/20 1548 09/16/20 0215  WBC 8.9 7.5  HGB 12.7 12.1  HCT 40.8 38.1  PLT 248 247  MCV 95.1 94.1  MCH 29.6 29.9  MCHC 31.1 31.8  RDW 17.1* 17.2*    Chemistries  Recent Labs  Lab 09/15/20 1548 09/16/20 0215 09/17/20 0612 09/18/20 0607 09/19/20 0500 09/20/20 0510 09/21/20 0552  NA 139 139 141 139 139 140 139  K 3.4* 2.9* 2.7* 2.7* 2.8* 3.1* 3.0*  CL 103 102 102 99 97* 96* 95*  CO2 25 26 27 26 29 30 31   GLUCOSE 104* 100* 100* 96 93 95 95  BUN 20 20 19  21* 20 21* 22*  CREATININE 1.48* 1.58* 1.46* 1.49* 1.60* 1.58* 1.55*  CALCIUM 9.1 8.9 9.2 9.0 9.1 9.2 9.2  MG  --  1.8  --  1.7  --  1.8  --   AST 26 26  --   --   --   --   --   ALT 16 14  --   --   --   --   --   ALKPHOS 51 49  --   --   --   --   --   BILITOT 2.4* 2.4*  --   --   --   --   --    ------------------------------------------------------------------------------------------------------------------ No results for input(s): CHOL, HDL, LDLCALC, TRIG, CHOLHDL, LDLDIRECT in the last 72 hours.  No results found for:  HGBA1C ------------------------------------------------------------------------------------------------------------------ No results for input(s): TSH, T4TOTAL, T3FREE, THYROIDAB in the last 72 hours.  Invalid input(s): FREET3 ------------------------------------------------------------------------------------------------------------------ No results for input(s): VITAMINB12, FOLATE, FERRITIN, TIBC, IRON, RETICCTPCT in the last 72 hours.  Coagulation profile Recent Labs  Lab 09/16/20 0215  INR 1.3*    No results for input(s): DDIMER in the last 72 hours.  Cardiac Enzymes No results for input(s): CKMB, TROPONINI, MYOGLOBIN in the last 168 hours.  Invalid input(s): CK ------------------------------------------------------------------------------------------------------------------    Component Value Date/Time   BNP 318.0 (H) 09/15/2020 1548    Roxan Hockey M.D on 09/21/2020 at 4:06 PM  Go to [email or web address] - for contact info  [facility] - Office  [phone]

## 2020-09-22 DIAGNOSIS — I5033 Acute on chronic diastolic (congestive) heart failure: Secondary | ICD-10-CM | POA: Diagnosis present

## 2020-09-22 DIAGNOSIS — I509 Heart failure, unspecified: Secondary | ICD-10-CM | POA: Insufficient documentation

## 2020-09-22 DIAGNOSIS — R601 Generalized edema: Secondary | ICD-10-CM | POA: Diagnosis not present

## 2020-09-22 LAB — BASIC METABOLIC PANEL
Anion gap: 13 (ref 5–15)
BUN: 21 mg/dL — ABNORMAL HIGH (ref 6–20)
CO2: 32 mmol/L (ref 22–32)
Calcium: 9.1 mg/dL (ref 8.9–10.3)
Chloride: 94 mmol/L — ABNORMAL LOW (ref 98–111)
Creatinine, Ser: 1.57 mg/dL — ABNORMAL HIGH (ref 0.44–1.00)
GFR, Estimated: 41 mL/min — ABNORMAL LOW (ref >60)
Glucose, Bld: 90 mg/dL (ref 70–99)
Potassium: 2.8 mmol/L — ABNORMAL LOW (ref 3.5–5.1)
Sodium: 139 mmol/L (ref 135–145)

## 2020-09-22 LAB — RENAL FUNCTION PANEL
Albumin: 3.9 g/dL (ref 3.5–5.0)
Anion gap: 14 (ref 5–15)
BUN: 21 mg/dL — ABNORMAL HIGH (ref 6–20)
CO2: 32 mmol/L (ref 22–32)
Calcium: 9.1 mg/dL (ref 8.9–10.3)
Chloride: 94 mmol/L — ABNORMAL LOW (ref 98–111)
Creatinine, Ser: 1.55 mg/dL — ABNORMAL HIGH (ref 0.44–1.00)
GFR, Estimated: 42 mL/min — ABNORMAL LOW (ref >60)
Glucose, Bld: 88 mg/dL (ref 70–99)
Phosphorus: 3.6 mg/dL (ref 2.5–4.6)
Potassium: 2.9 mmol/L — ABNORMAL LOW (ref 3.5–5.1)
Sodium: 140 mmol/L (ref 135–145)

## 2020-09-22 LAB — CBC
HCT: 39.3 % (ref 36.0–46.0)
Hemoglobin: 12.1 g/dL (ref 12.0–15.0)
MCH: 29.2 pg (ref 26.0–34.0)
MCHC: 30.8 g/dL (ref 30.0–36.0)
MCV: 94.7 fL (ref 80.0–100.0)
Platelets: 230 10*3/uL (ref 150–400)
RBC: 4.15 MIL/uL (ref 3.87–5.11)
RDW: 16.7 % — ABNORMAL HIGH (ref 11.5–15.5)
WBC: 6.7 10*3/uL (ref 4.0–10.5)
nRBC: 0 % (ref 0.0–0.2)

## 2020-09-22 LAB — MAGNESIUM: Magnesium: 1.8 mg/dL (ref 1.7–2.4)

## 2020-09-22 MED ORDER — POTASSIUM CHLORIDE CRYS ER 20 MEQ PO TBCR: 40.0000 meq | EXTENDED_RELEASE_TABLET | Freq: Three times a day (TID) | ORAL | 2 refills | Status: DC

## 2020-09-22 MED ORDER — TORSEMIDE 20 MG PO TABS
60.0000 mg | ORAL_TABLET | Freq: Two times a day (BID) | ORAL | Status: DC
  Administered 2020-09-22 (×2): 60 mg via ORAL
  Filled 2020-09-22 (×3): qty 3

## 2020-09-22 MED ORDER — POTASSIUM CHLORIDE CRYS ER 20 MEQ PO TBCR
40.0000 meq | EXTENDED_RELEASE_TABLET | Freq: Four times a day (QID) | ORAL | Status: DC
  Administered 2020-09-22 (×3): 40 meq via ORAL
  Filled 2020-09-22 (×2): qty 2

## 2020-09-22 MED ORDER — POTASSIUM CHLORIDE CRYS ER 20 MEQ PO TBCR
40.0000 meq | EXTENDED_RELEASE_TABLET | Freq: Once | ORAL | Status: AC
  Administered 2020-09-22: 40 meq via ORAL
  Filled 2020-09-22: qty 2

## 2020-09-22 MED ORDER — TORSEMIDE 20 MG PO TABS: 60.0000 mg | ORAL_TABLET | Freq: Once | ORAL | Status: DC

## 2020-09-22 MED ORDER — MAGNESIUM OXIDE 400 MG PO CAPS: 1.0000 | ORAL_CAPSULE | Freq: Every day | ORAL | 2 refills | Status: DC

## 2020-09-22 MED ORDER — MAGNESIUM SULFATE 2 GM/50ML IV SOLN
2.0000 g | Freq: Once | INTRAVENOUS | Status: AC
  Administered 2020-09-22: 2 g via INTRAVENOUS
  Filled 2020-09-22: qty 50

## 2020-09-22 MED ORDER — TORSEMIDE 20 MG PO TABS: 60.0000 mg | ORAL_TABLET | Freq: Two times a day (BID) | ORAL | 2 refills | Status: DC

## 2020-09-22 NOTE — Progress Notes (Signed)
Patient Demographics:    Lauren Osborn, is a 45 y.o. female, DOB - Feb 24, 1976, PJK:[id number]  Admit date - 09/15/2020   Admitting Physician Bernadette Hoit, DO  Outpatient Primary MD for the patient is [name], MD  LOS - 6   Chief Complaint  Patient presents with  . Shortness of Breath        Subjective:    Lauren Osborn today has no fevers, no emesis,  No chest pain,    Says she is able to ambulate better (some DOE persist)    Assessment  & Plan :    Active Problems:   Pulmonary edema   Hypokalemia   Prolonged QT interval   Acute kidney injury (HCC)   Elevated brain natriuretic peptide (BNP) level   GERD (gastroesophageal reflux disease)   Anasarca   Super obese  Brief Summary:- 45 y.o. female with medical history significant for hypertension and chronic lower extremity edema, admitted on 09/15/20 with progressive dyspnea on exertion, abdominal distension and lower extremity edema since 06/2020.  Compliance with oral diuretics--- on admission weight up to 358 pounds from recent wt around 280 lb (a few months ago her weight was 351lb) -patient failed oral diuretics prior to admission, patient needs IV diuretics   A/p 1)HFpEF--- patient admitted with acute on chronic diastolic dysfunction CHF, repeat echo with preserved EF around 70% -BNP 318 but this may be underestimated in obese patients with diastolic dysfunction -Chest x-ray and clinical exam supports overload status/pulmonary edema -Failed oral diuretics as outpatient PTA , daily weights, fluid input and output charting -Weight appears to be down to 329.8 pounds from 358 pounds -Fluid balance negative '  09/22/20 --Cardiologist recommended switching from IV Lasix drip to p.o. torsemide and observing urine output for at least 24 hours to ensure effectiveness of oral diuresis prior to possible discharge on oral diuretics. --  Please note the patient failed oral diuretics prior to admission    2)AKI----acute kidney injury--- due to aggressive diuresis -GFR is greater than 60 and creatinine was 1.0 on 08/13/2020 -Creatinine is around 1.6 which was the same on admission -monitor closely with diuresis, -on iv Lasix drip  -- renally adjust medications, avoid nephrotoxic agents / dehydration  / hypotension  3)Hypokalemia--- magnesium WNL, replace and monitor closely with aggressive diuresis -Started on scheduled potassium replacement in addition to as needed potassium replacements  4)Morbid Obesity- -Low calorie diet, portion control and increase physical activity discussed with patient -Body mass index is 58.42 kg/m.  -Patient admits to-Excessive Pepsi/soda intake--- she appears to get at least 1500 cal from just soda per day  --Will need outpatient study for possible sleep apnea -  5)H/o Prior partial thyroidectomy--- TSH WNL  Disposition/Need for in-Hospital Stay- patient unable to be discharged at this time due to --- very significant volume overload/pulmonary edema, failed oral diuretics needed IV diuretics and monitoring of electrolytes and renal function while aggressively diuresing (patient failed oral diuretics prior to admission) -Possible DC on 09/23/2020  Status is: Inpatient  Remains inpatient appropriate because:Please see below   Disposition: The patient is from: Home              Anticipated d/c is to: Home  Anticipated d/c date is: 1 day              Patient currently is not medically stable to d/c. Barriers: Not Clinically Stable-   Code Status :  -  Code Status: Full Code   Family Communication:    NA (patient is alert, awake and coherent)   Consults  :  card  DVT Prophylaxis  :   - SCDs   heparin injection 5,000 Units Start: 09/16/20 1400 SCDs Start: 09/16/20 0137   Lab Results  Component Value Date   PLT 230 09/22/2020    Inpatient Medications  Scheduled  Meds: . heparin injection (subcutaneous)  5,000 Units Subcutaneous Q8H  . magnesium oxide  200 mg Oral Daily  . pantoprazole  40 mg Oral Daily  . potassium chloride  40 mEq Oral QID  . torsemide  60 mg Oral BID   Continuous Infusions: . magnesium sulfate bolus IVPB 2 g (09/22/20 1208)   PRN Meds:.    Anti-infectives (From admission, onward)   None        Objective:   Vitals:   09/21/20 1331 09/21/20 2208 09/22/20 0549 09/22/20 0802  BP: 111/66 106/68 110/81   Pulse: 92 87 87   Resp: 18 19 18    Temp: 98.4 F (36.9 C) 97.8 F (36.6 C) 97.7 F (36.5 C)   TempSrc: Oral Oral    SpO2: 97% 100% 96%   Weight:    (!) 149.6 kg  Height:        Wt Readings from Last 3 Encounters:  09/22/20 (!) 149.6 kg  09/09/20 (!) 159.2 kg  08/13/20 129.7 kg     Intake/Output Summary (Last 24 hours) at 09/22/2020 1245 Last data filed at 09/22/2020 0900 Gross per 24 hour  Intake 720 ml  Output 2600 ml  Net -1880 ml    Physical Exam  Gen:- Awake Alert, morbidly obese, no conversational dyspnea HEENT:- [place].AT, No sclera icterus Neck-Supple Neck, Lungs-improving air movement, no wheezing CV- S1, S2 normal, regular , DOE is Not resolved Abd-  +ve B.Sounds, Abd Soft, No tenderness, increased truncal adiposity    Extremity/Skin:-Improving bilateral lower extremity edema (2+LE ), pedal pulses present  Psych-affect is appropriate, oriented x3 Neuro-generalized weakness no new focal deficits, no tremors   Data Review:   Micro Results Recent Results (from the past 240 hour(s))  Resp Panel by RT-PCR (Flu A&B, Covid) Nasopharyngeal Swab     Status: None   Collection Time: 09/15/20 11:01 PM   Specimen: Nasopharyngeal Swab; Nasopharyngeal(NP) swabs in vial transport medium  Result Value Ref Range Status   SARS Coronavirus 2 by RT PCR NEGATIVE NEGATIVE Final    Comment: (NOTE) SARS-CoV-2 target nucleic acids are NOT DETECTED.  The SARS-CoV-2 RNA is generally detectable in upper  respiratory specimens during the acute phase of infection. The lowest concentration of SARS-CoV-2 viral copies this assay can detect is 138 copies/mL. A negative result does not preclude SARS-Cov-2 infection and should not be used as the sole basis for treatment or other patient management decisions. A negative result may occur with  improper specimen collection/handling, submission of specimen other than nasopharyngeal swab, presence of viral mutation(s) within the areas targeted by this assay, and inadequate number of viral copies(<138 copies/mL). A negative result must be combined with clinical observations, patient history, and epidemiological information. The expected result is Negative.  Fact Sheet for Patients:  EntrepreneurPulse.com.au  Fact Sheet for Healthcare Providers:  IncredibleEmployment.be  This test is no t yet approved  or cleared by the [place] and  has been authorized for detection and/or diagnosis of SARS-CoV-2 by FDA under an Emergency Use Authorization (EUA). This EUA will remain  in effect (meaning this test can be used) for the duration of the COVID-19 declaration under Section 564(b)(1) of the Act, 21 U.S.C.section 360bbb-3(b)(1), unless the authorization is terminated  or revoked sooner.       Influenza A by PCR NEGATIVE NEGATIVE Final   Influenza B by PCR NEGATIVE NEGATIVE Final    Comment: (NOTE) The Xpert Xpress SARS-CoV-2/FLU/RSV plus assay is intended as an aid in the diagnosis of influenza from Nasopharyngeal swab specimens and should not be used as a sole basis for treatment. Nasal washings and aspirates are unacceptable for Xpert Xpress SARS-CoV-2/FLU/RSV testing.  Fact Sheet for Patients: EntrepreneurPulse.com.au  Fact Sheet for Healthcare Providers: IncredibleEmployment.be  This test is not yet approved or cleared by the [place] FDA and has been  authorized for detection and/or diagnosis of SARS-CoV-2 by FDA under an Emergency Use Authorization (EUA). This EUA will remain in effect (meaning this test can be used) for the duration of the COVID-19 declaration under Section 564(b)(1) of the Act, 21 U.S.C. section 360bbb-3(b)(1), unless the authorization is terminated or revoked.  Performed at [facility], [address], [place], [place] [address]     Radiology Reports DG Chest 2 View  Result Date: 09/20/2020 CLINICAL DATA:  Persistent fluid retention over the last 3 months with weight gain and intermittent shortness of breath. Former smoker. EXAM: CHEST - 2 VIEW COMPARISON:  Radiographs 09/17/2020 and 09/15/2020. FINDINGS: The heart size and mediastinal contours are stable. Compared with the most recent study, there are mildly increased diffuse interstitial opacities most consistent with edema. There is associated fissural thickening and a stable small right pleural effusion. No confluent airspace opacity or pneumothorax. The bones appear unchanged. Telemetry leads overlie the chest. IMPRESSION: Mildly increased diffuse interstitial opacities most consistent with pulmonary edema. Electronically Signed   By: Richardean Sale M.D.   On: 09/20/2020 08:45   DG Chest 2 View  Result Date: 09/17/2020 CLINICAL DATA:  Shortness of breath, fluid retention EXAM: CHEST - 2 VIEW COMPARISON:  09/15/2020 FINDINGS: No significant change in chest radiographs with mild, diffuse interstitial opacity and a small right pleural effusion. No new or focal airspace opacity. The heart and mediastinum are unremarkable. Mild disc degenerative disease of the thoracic spine. IMPRESSION: No significant change in chest radiographs with mild, diffuse interstitial opacity and a small right pleural effusion, likely edema. No new or focal airspace opacity. Electronically Signed   By: Eddie Candle M.D.   On: 09/17/2020 09:00   DG Chest 2 View  Result Date: 09/15/2020 CLINICAL  DATA:  Increasing shortness of breath and wheezing for 1 month. EXAM: CHEST - 2 VIEW COMPARISON:  Single-view of the chest 02/06/2020. FINDINGS: The patient has a small right pleural effusion. There is pulmonary edema. No consolidative process or pneumothorax. Heart size is upper normal. IMPRESSION: Pulmonary edema with a small right pleural effusion. Electronically Signed   By: Inge Rise M.D.   On: 09/15/2020 14:25   ECHOCARDIOGRAM COMPLETE  Result Date: 09/16/2020    ECHOCARDIOGRAM REPORT   Patient Name:   KYRSTAL MONTERROSA Date of Exam: 09/16/2020 Medical Rec #:  [id number]      Height:       63.0 in Accession #:    [id number]     Weight:       358.0 lb  Date of Birth:  1976/02/06      BSA:          2.477 m Patient Age:    89 years       BP:           113/82 mmHg Patient Gender: F              HR:           89 bpm. Exam Location:  [facility] Procedure: 2D Echo Indications:     Dyspnea R06.00  History:         Patient has prior history of Echocardiogram examinations, most                  recent 03/18/2020. Risk Factors:Former Smoker. Acute Kidney                  Injury, GERD, Prolonged QT interval.  Sonographer:     Leavy Cella RDCS (AE) Referring Phys:  [id number] Tobe Sos ADEFESO Diagnosing Phys: Carlyle Dolly MD IMPRESSIONS  1. Left ventricular ejection fraction, by estimation, is 65 to 70%. The left ventricle has normal function. The left ventricle has no regional wall motion abnormalities. Left ventricular diastolic parameters were normal.  2. RV not well visualized. Grossly appears enlarged. Function appears to be decreased but difficult to quantify. Intermittent septal bounce likely secondary to RV strain, as there is no echocardiographic signs of constrictive pericarditis (normal mitral  inflow, normal MV tissue Doppler). . Right ventricular systolic function was not well visualized. The right ventricular size is not well visualized.  3. The mitral valve is normal in structure. No evidence of  mitral valve regurgitation. No evidence of mitral stenosis.  4. The aortic valve was not well visualized. Aortic valve regurgitation is not visualized. No aortic stenosis is present. FINDINGS  Left Ventricle: Left ventricular ejection fraction, by estimation, is 65 to 70%. The left ventricle has normal function. The left ventricle has no regional wall motion abnormalities. Definity contrast agent was given IV to delineate the left ventricular  endocardial borders. The left ventricular internal cavity size was normal in size. There is no left ventricular hypertrophy. Left ventricular diastolic parameters were normal. Right Ventricle: RV not well visualized. Grossly appears enlarged. Function appears to be decreased but difficult to quantify. Intermittent septal bounce likely secondary to RV strain, as there is no echocardiographic signs of constrictive pericarditis (normal mitral inflow, normal MV tissue Doppler). The right ventricular size is not well visualized. Right vetricular wall thickness was not well visualized. Right ventricular systolic function was not well visualized. Left Atrium: Left atrial size was normal in size. Right Atrium: Right atrial size was not well visualized. Pericardium: There is no evidence of pericardial effusion. Mitral Valve: The mitral valve is normal in structure. No evidence of mitral valve regurgitation. No evidence of mitral valve stenosis. Tricuspid Valve: The tricuspid valve is not well visualized. Tricuspid valve regurgitation is trivial. No evidence of tricuspid stenosis. Aortic Valve: The aortic valve was not well visualized. Aortic valve regurgitation is not visualized. No aortic stenosis is present. Aortic valve mean gradient measures 3.0 mmHg. Aortic valve peak gradient measures 6.2 mmHg. Aortic valve area, by VTI measures 2.54 cm. Pulmonic Valve: The pulmonic valve was not well visualized. Pulmonic valve regurgitation is not visualized. No evidence of pulmonic stenosis.  Aorta: The aortic root is normal in size and structure. Venous: The inferior vena cava was not well visualized. IAS/Shunts: The interatrial septum was not well visualized.  LEFT VENTRICLE PLAX 2D LVIDd:         4.25 cm  Diastology LVIDs:         2.60 cm  LV e' medial:    10.40 cm/s LV PW:         0.90 cm  LV E/e' medial:  10.4 LV IVS:        0.82 cm  LV e' lateral:   11.50 cm/s LVOT diam:     1.80 cm  LV E/e' lateral: 9.4 LV SV:         53 LV SV Index:   21 LVOT Area:     2.54 cm  RIGHT VENTRICLE TAPSE (M-mode): 1.6 cm LEFT ATRIUM             Index       RIGHT ATRIUM           Index LA diam:        3.50 cm 1.41 cm/m  RA Area:     14.70 cm LA Vol (A2C):   59.4 ml 23.99 ml/m RA Volume:   40.40 ml  16.31 ml/m LA Vol (A4C):   31.9 ml 12.88 ml/m LA Biplane Vol: 44.2 ml 17.85 ml/m  AORTIC VALVE AV Area (Vmax):    2.21 cm AV Area (Vmean):   2.24 cm AV Area (VTI):     2.54 cm AV Vmax:           124.23 cm/s AV Vmean:          79.476 cm/s AV VTI:            0.207 m AV Peak Grad:      6.2 mmHg AV Mean Grad:      3.0 mmHg LVOT Vmax:         107.75 cm/s LVOT Vmean:        69.931 cm/s LVOT VTI:          0.207 m LVOT/AV VTI ratio: 1.00  AORTA Ao Root diam: 2.80 cm MITRAL VALVE                TRICUSPID VALVE MV Area (PHT): 3.76 cm     TR Peak grad:   15.1 mmHg MV Decel Time: 202 msec     TR Vmax:        194.00 cm/s MV E velocity: 108.00 cm/s MV A velocity: 58.00 cm/s   SHUNTS MV E/A ratio:  1.86         Systemic VTI:  0.21 m                             Systemic Diam: 1.80 cm Carlyle Dolly MD Electronically signed by Carlyle Dolly MD Signature Date/Time: 09/16/2020/11:16:17 AM    Final (Updated)    US Abdomen Limited RUQ (LIVER/GB)  Result Date: 09/08/2020 CLINICAL DATA:  Abnormal LFTs for 6 months, history hypertension EXAM: ULTRASOUND ABDOMEN LIMITED RIGHT UPPER QUADRANT COMPARISON:  None FINDINGS: Gallbladder: Suboptimally assessed due to body habitus. Large shadowing echogenic focus seen within gallbladder 5.3  cm length likely a large gallstone versus less likely multiple tiny calculi. Mildly thickened gallbladder wall. No pericholecystic fluid. No sonographic Murphy sign. Common bile duct: Diameter: 4 mm Liver: Normal parenchymal echogenicity. Hyperechoic mass RIGHT lobe 3.4 x 3.8 x 2.7 cm. No additional masses identified. Portal vein is patent on color Doppler imaging with normal direction of blood flow towards the liver. Other: No RIGHT upper quadrant free  fluid. IMPRESSION: Cholelithiasis with probable large single calculus 5.3 cm diameter versus less likely multiple tiny dependent calculi within gallbladder. No biliary dilatation. Hyperechoic mass RIGHT lobe liver 3.8 cm in size; finding is nonspecific, could potentially represent a hepatic hemangioma but is not diagnostic and other etiologies are not excluded; further characterization by MR imaging with and without contrast recommended. Electronically Signed   By: Lavonia Dana M.D.   On: 09/08/2020 10:23     CBC Recent Labs  Lab 09/15/20 1548 09/16/20 0215 09/22/20 0344  WBC 8.9 7.5 6.7  HGB 12.7 12.1 12.1  HCT 40.8 38.1 39.3  PLT 248 247 230  MCV 95.1 94.1 94.7  MCH 29.6 29.9 29.2  MCHC 31.1 31.8 30.8  RDW 17.1* 17.2* 16.7*    Chemistries  Recent Labs  Lab 09/15/20 1548 09/16/20 0215 09/17/20 0612 09/18/20 0607 09/19/20 0500 09/20/20 0510 09/21/20 0552 09/22/20 0344  NA 139 139   < > 139 139 140 139 140  139  K 3.4* 2.9*   < > 2.7* 2.8* 3.1* 3.0* 2.9*  2.8*  CL 103 102   < > 99 97* 96* 95* 94*  94*  CO2 25 26   < > 26 29 30 31  32  32  GLUCOSE 104* 100*   < > 96 93 95 95 88  90  BUN 20 20   < > 21* 20 21* 22* 21*  21*  CREATININE 1.48* 1.58*   < > 1.49* 1.60* 1.58* 1.55* 1.55*  1.57*  CALCIUM 9.1 8.9   < > 9.0 9.1 9.2 9.2 9.1  9.1  MG  --  1.8  --  1.7  --  1.8  --  1.8  AST 26 26  --   --   --   --   --   --   ALT 16 14  --   --   --   --   --   --   ALKPHOS 51 49  --   --   --   --   --   --   BILITOT 2.4* 2.4*  --    --   --   --   --   --    < > = values in this interval not displayed.   ------------------------------------------------------------------------------------------------------------------ No results for input(s): CHOL, HDL, LDLCALC, TRIG, CHOLHDL, LDLDIRECT in the last 72 hours.  No results found for: HGBA1C ------------------------------------------------------------------------------------------------------------------ No results for input(s): TSH, T4TOTAL, T3FREE, THYROIDAB in the last 72 hours.  Invalid input(s): FREET3 ------------------------------------------------------------------------------------------------------------------ No results for input(s): VITAMINB12, FOLATE, FERRITIN, TIBC, IRON, RETICCTPCT in the last 72 hours.  Coagulation profile Recent Labs  Lab 09/16/20 0215  INR 1.3*    No results for input(s): DDIMER in the last 72 hours.  Cardiac Enzymes No results for input(s): CKMB, TROPONINI, MYOGLOBIN in the last 168 hours.  Invalid input(s): CK ------------------------------------------------------------------------------------------------------------------    Component Value Date/Time   BNP 318.0 (H) 09/15/2020 1548    Roxan Hockey M.D on 09/22/2020 at 12:45 PM  Go to [email or web address] - for contact info  [facility] - Office  [phone]

## 2020-09-22 NOTE — Progress Notes (Signed)
Progress Note  Patient Name: Lauren Osborn Date of Encounter: 09/22/2020  Primary Cardiologist: Rozann Lesches, MD  Subjective   Ambulated in the hall twice yesterday with her daughter.  Significantly less short of breath, still has leg swelling/lymphedema.  Her weight continues to come down with IV diuresis.  Inpatient Medications    Scheduled Meds: . heparin injection (subcutaneous)  5,000 Units Subcutaneous Q8H  . magnesium oxide  200 mg Oral Daily  . pantoprazole  40 mg Oral Daily  . potassium chloride  40 mEq Oral QID   Continuous Infusions: . furosemide (LASIX) 200 mg in dextrose 5% 100 mL (2mg /mL) infusion 8 mg/hr (09/21/20 1018)    Vital Signs    Vitals:   09/21/20 1331 09/21/20 2208 09/22/20 0549 09/22/20 0802  BP: 111/66 106/68 110/81   Pulse: 92 87 87   Resp: 18 19 18    Temp: 98.4 F (36.9 C) 97.8 F (36.6 C) 97.7 F (36.5 C)   TempSrc: Oral Oral    SpO2: 97% 100% 96%   Weight:    (!) 149.6 kg  Height:        Intake/Output Summary (Last 24 hours) at 09/22/2020 0950 Last data filed at 09/22/2020 0500 Gross per 24 hour  Intake 480 ml  Output 2800 ml  Net -2320 ml   Filed Weights   09/20/20 0452 09/21/20 0545 09/22/20 0802  Weight: (!) 151.6 kg (!) 150.6 kg (!) 149.6 kg    Telemetry    Sinus rhythm.  Personally reviewed.  Physical Exam   GEN: No acute distress.   Neck: No JVD. Cardiac: RRR, no murmur, rub, or gallop.  Respiratory: Nonlabored. Clear to auscultation bilaterally. GI:  Obese, bowel sounds present. MS:  Improving edema/lymphedema. Neuro:  Nonfocal. Psych: Alert and oriented x 3. Normal affect.  Labs    Chemistry Recent Labs  Lab 09/15/20 1548 09/16/20 0215 09/17/20 0612 09/20/20 0510 09/21/20 0552 09/22/20 0344  NA 139 139   < > 140 139 140  139  K 3.4* 2.9*   < > 3.1* 3.0* 2.9*  2.8*  CL 103 102   < > 96* 95* 94*  94*  CO2 25 26   < > 30 31 32  32  GLUCOSE 104* 100*   < > 95 95 88  90  BUN 20 20   < > 21*  22* 21*  21*  CREATININE 1.48* 1.58*   < > 1.58* 1.55* 1.55*  1.57*  CALCIUM 9.1 8.9   < > 9.2 9.2 9.1  9.1  PROT 7.0 6.7  --   --   --   --   ALBUMIN 4.2 4.0   < > 4.0 3.9 3.9  AST 26 26  --   --   --   --   ALT 16 14  --   --   --   --   ALKPHOS 51 49  --   --   --   --   BILITOT 2.4* 2.4*  --   --   --   --   GFRNONAA 44* 41*   < > 41* 42* 42*  41*  ANIONGAP 11 11   < > 14 13 14  13    < > = values in this interval not displayed.     Hematology Recent Labs  Lab 09/15/20 1548 09/16/20 0215 09/22/20 0344  WBC 8.9 7.5 6.7  RBC 4.29 4.05 4.15  HGB 12.7 12.1 12.1  HCT 40.8 38.1 39.3  MCV 95.1 94.1 94.7  MCH 29.6 29.9 29.2  MCHC 31.1 31.8 30.8  RDW 17.1* 17.2* 16.7*  PLT 248 247 230    BNP Recent Labs  Lab 09/15/20 1548  BNP 318.0*     Radiology    No results found.  Cardiac Studies   Echocardiogram 09/16/2020: 1. Left ventricular ejection fraction, by estimation, is 65 to 70%. The  left ventricle has normal function. The left ventricle has no regional  wall motion abnormalities. Left ventricular diastolic parameters were  normal.  2. RV not well visualized. Grossly appears enlarged. Function appears to  be decreased but difficult to quantify. Intermittent septal bounce likely  secondary to RV strain, as there is no echocardiographic signs of  constrictive pericarditis (normal mitral  inflow, normal MV tissue Doppler). . Right ventricular systolic function  was not well visualized. The right ventricular size is not well  visualized.  3. The mitral valve is normal in structure. No evidence of mitral valve  regurgitation. No evidence of mitral stenosis.  4. The aortic valve was not well visualized. Aortic valve regurgitation  is not visualized. No aortic stenosis is present.  Patient Profile     45 y.o. female with PMH of chronic diastolic CHF/RV dysfunction, HTN and GERDwho is currently admitted for an acute CHF exacerbation with associated weight  gain of 70 lbs since 08/2020.  Assessment & Plan    1. Acute on chronic diastolic heart failure with RV dysfunction.  Weight down to 329 pounds, she continues to diurese on Lasix infusion and creatinine remained stable at 1.55-1.57.  Recorded net urine output incomplete last 24 hours.  We discussed attempting transition to oral regimen.  2. Hypokalemia in the setting of IV diuresis.  She is on high-dose potassium supplementation, also additional magnesium supplementation.  3. Acute renal insufficiency, creatinine relatively stable at 1.5-1.6 range.  4. Obesity with risk for OHS/OSA.  Plan to transition from Lasix infusion to Demadex 60 mg twice daily.  Would observe for urine output over the next 24 hours to ensure reasonable response.  Continue potassium supplementation.  Increase activity, can hopefully transition home soon for continued diuresis on oral regimen and office follow-up.  Signed, Rozann Lesches, MD  09/22/2020, 9:50 AM

## 2020-09-22 NOTE — Discharge Instructions (Signed)
1)Very low-salt diet advised 2)Weigh yourself daily, call if you gain more than 3 pounds in 1 day or more than 5 pounds in 1 week as your diuretic medications may need to be adjusted 3)Limit your Fluid  intake to no more than 60 ounces (1.8 Liters) per day 4)Avoid ibuprofen/Advil/Aleve/Motrin/Goody Powders/Naproxen/BC powders/Meloxicam/Diclofenac/Indomethacin and other Nonsteroidal anti-inflammatory medications as these will make you more likely to bleed and can cause stomach ulcers, can also cause Kidney problems.  5)Repeat BMP blood test with your primary care  physician on Monday, 09/27/2020 6) please take potassium and magnesium supplements as prescribed 7) cardiology office visit follow-up with Dr. Domenic Polite in about 1 week

## 2020-09-22 NOTE — Discharge Summary (Signed)
Lauren Osborn, is a 45 y.o. female  DOB [birth year]  MRN [id number].  Admission date:  09/15/2020  Admitting Physician  Bernadette Hoit, DO  Discharge Date:  09/22/2020   Primary MD  [name], MD  Recommendations for primary care physician for things to follow:   1)Very low-salt diet advised 2)Weigh yourself daily, call if you gain more than 3 pounds in 1 day or more than 5 pounds in 1 week as your diuretic medications may need to be adjusted 3)Limit your Fluid  intake to no more than 60 ounces (1.8 Liters) per day 4)Avoid ibuprofen/Advil/Aleve/Motrin/Goody Powders/Naproxen/BC powders/Meloxicam/Diclofenac/Indomethacin and other Nonsteroidal anti-inflammatory medications as these will make you more likely to bleed and can cause stomach ulcers, can also cause Kidney problems.  5)Repeat BMP blood test with your primary care  physician on Monday, 09/27/2020 6) please take potassium and magnesium supplements as prescribed 7) cardiology office visit follow-up with Dr. Domenic Polite in about 1 week  Admission Diagnosis  Hypokalemia [E87.6] Anasarca [R60.1] Acute pulmonary edema ([place]) [J81.0] Pulmonary edema [J81.1] Dyspnea and respiratory abnormalities [R06.00, R06.89]   Discharge Diagnosis  Hypokalemia [E87.6] Anasarca [R60.1] Acute pulmonary edema (HCC) [J81.0] Pulmonary edema [J81.1] Dyspnea and respiratory abnormalities [R06.00, R06.89]    Principal Problem:   Acute on chronic heart failure with preserved ejection fraction (HFpEF) (HCC) Active Problems:   Pulmonary edema   Elevated brain natriuretic peptide (BNP) level   Morbid obesity with BMI of 50.0-59.9, adult (HCC)   Hypokalemia   Prolonged QT interval   Acute kidney injury ([place])   GERD (gastroesophageal reflux disease)   Anasarca   Super obese      Past Medical History:  Diagnosis Date   Essential hypertension    Fibroids    Gout     Thyroid nodule     Past Surgical History:  Procedure Laterality Date   BIOPSY  08/03/2020   Procedure: BIOPSY;  Surgeon: Eloise Harman, DO;  [facility];  Service: Endoscopy;;  duodenal gastric   CESAREAN SECTION     COLONOSCOPY WITH PROPOFOL N/A 08/03/2020   Procedure: COLONOSCOPY WITH PROPOFOL;  Surgeon: Eloise Harman, DO;  [facility];  Service: Endoscopy;  Laterality: N/A;  1:15pm-rescheduled to 11/30 @ 8:00am   ESOPHAGOGASTRODUODENOSCOPY (EGD) WITH PROPOFOL N/A 08/03/2020   Procedure: ESOPHAGOGASTRODUODENOSCOPY (EGD) WITH PROPOFOL;  Surgeon: Eloise Harman, DO;  [facility];  Service: Endoscopy;  Laterality: N/A;   HYSTERECTOMY ABDOMINAL WITH SALPINGECTOMY  2008   POLYPECTOMY  08/03/2020   Procedure: POLYPECTOMY;  Surgeon: Eloise Harman, DO;  [facility];  Service: Endoscopy;;  colon   THYROIDECTOMY Right 10/21/2018   Procedure: RIGHT HEMITHYROIDECTOMY;  Surgeon: Leta Baptist, MD;  Location: [facility];  Service: ENT;  Laterality: Right;       HPI  from the history and physical done on the day of admission:    Chief Complaint: Shortness of breath  HPI: Lauren Osborn is a 45 y.o. female with medical history  significant for hypertension and chronic lower extremity edema of unknown cause who presents to the emergency department due to several months of increasing weight gain with recent associated with shortness of breath which significantly got worsened within the last 24 to 48 hours PTA.  Patient states that she was on Lasix 20 mg with her PCP who unfortunately relocated, she was able to follow-up with another PCP who started her on Lasix 20 mg and spironolactone 20 mg, however, patient states that she had to wait about 2 weeks without being on any diuretics prior to being able to obtain an appointment with a new PCP.  She was also referred to the cardiologist, Dr. Domenic Polite who increased Lasix to 80  mg and an echocardiogram done in July 2021 showed LVEF of 70 to 75% with no regional wall motion abnormality.  Despite this, patient continues to gain weight and she states that she has gained more than 70 pounds within last 3 months.  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  Patient states that she called Dr. Myles Gip office this morning and she was asked to go to the ED for IV diuresis. She denies fever, chills, chest pain, orthopnea, headache, nausea, vomiting.  ED Course:  In the emergency department, she was hemodynamically stable.  Work-up in the ED showed normal CBC, hypokalemia, BUN/creatinine 20/1.48 (baseline creatinine 1.0-1.1).  BNP 318 Chest x-ray showed pulmonary edema with a small right pleural effusion IV Lasix 80 Mg x1 was given.  Hospitalist was asked to admit patient for further evaluation and management.      Hospital Course:    Brief Summary:- 45 y.o.femalewith medical history significant forhypertension and chronic lower extremity edema, admitted on 09/15/20 with progressive dyspnea on exertion, abdominal distension and lower extremity edema since 06/2020.  Compliance with oral diuretics--- on admission weight up to 358 pounds from recent wt around 280 lb (a few months ago her weight was 351lb) -patient failed oral diuretics prior to admission, patient needs IV diuretics   A/p 1)HFpEF--- patient admitted with acute on chronic diastolic dysfunction CHF, repeat echo with preserved EF around 70% -BNP 318 but this may be underestimated in obese patients with diastolic dysfunction -Chest x-ray and clinical exam supports overload status/pulmonary edema -Failed oral diuretics as outpatient PTA , daily weights, fluid input and output charting -Weight appears to be down to 329.8  pounds from 358 pounds -Fluid balance negative '  09/22/20 --Cardiologist recommended switching from IV Lasix drip to p.o. torsemide   -Okay to discharge on p.o. torsemide 60 mg twice daily outpatient follow-up with cardiology on 09/24/2020 advised   2)AKI----acute kidney injury--- due to aggressive diuresis -At baseline --GFR was greater than 60 and creatinine was 1.0 on 08/13/2020 -Creatinine is around 1.6 which was the same on admission -- renally adjust medications, avoid nephrotoxic agents / dehydration  / hypotension  3)Hypokalemia--- magnesium WNL, replaced  -Started on scheduled potassium replacement    4)Morbid Obesity- -Low calorie diet, portion control and increase physical activity discussed with patient -Body mass index is 58.42 kg/m.  -Patient admits to-Excessive Pepsi/soda intake--- she appears to get at least 1500 cal from just soda per day  --Will need outpatient study for possible sleep apnea - 5)H/o Prior partial thyroidectomy--- TSH WNL  Disposition --discharge home with outpatient follow-up with cardiology  Disposition:  The patient is from: Home  Anticipated d/c is to: Home    Code Status :  -  Code Status: Full Code   Family Communication:    NA (patient is alert, awake and coherent)  Discussed with patient's daughter at bedside Consults  :  card   Discharge Condition: Stable  Follow UP   Follow-up Information    Satira Sark, MD. Schedule an appointment as soon as possible for a visit in 1 week(s).   Specialty: Cardiology Contact information: [place] [address] [phone]               Diet and Activity recommendation:  As advised  Discharge Instructions    Discharge Instructions    Call MD for:  difficulty breathing, headache or visual disturbances   Complete by: As directed    Call MD for:  persistant dizziness or light-headedness   Complete by: As directed    Call MD  for:  persistant nausea and vomiting   Complete by: As directed    Call MD for:  severe uncontrolled pain   Complete by: As directed    Call MD for:  temperature >100.4   Complete by: As directed    Diet - low sodium heart healthy   Complete by: As directed    Discharge instructions   Complete by: As directed    1)1)Very low-salt diet advised 2)Weigh yourself daily, call if you gain more than 3 pounds in 1 day or more than 5 pounds in 1 week as your diuretic medications may need to be adjusted 3)Limit your Fluid  intake to no more than 60 ounces (1.8 Liters) per day 4)Avoid ibuprofen/Advil/Aleve/Motrin/Goody Powders/Naproxen/BC powders/Meloxicam/Diclofenac/Indomethacin and other Nonsteroidal anti-inflammatory medications as these will make you more likely to bleed and can cause stomach ulcers, can also cause Kidney problems.  5)Repeat BMP blood test with your primary care  physician on Monday, 09/27/2020 6) please take potassium and magnesium supplements as prescribed 7) cardiology office visit follow-up with Dr. Domenic Polite in about 1 week   Increase activity slowly   Complete by: As directed        Discharge Medications     Allergies as of 09/22/2020      Reactions   Oxycodone-acetaminophen Hives, Other (See Comments)      Medication List    STOP taking these medications   furosemide 80 MG tablet Commonly known as: LASIX   potassium chloride 10 MEQ tablet Commonly known as: KLOR-CON     TAKE these medications   acetaminophen 500 MG tablet Commonly known as: TYLENOL Take 1,000 mg by mouth every 6 (six) hours as needed for moderate pain or headache.   FeroSul 325 (65 FE) MG tablet Generic drug: ferrous sulfate Take 325 mg by mouth 2 (two) times daily.   Magnesium Oxide 400 MG Caps Take 1 capsule (400 mg total) by mouth daily.   omeprazole 20 MG capsule Commonly known as: PriLOSEC Take 1 capsule (20 mg total) by mouth 2 (two) times daily.   potassium chloride SA 20  MEQ tablet Commonly known as: KLOR-CON Take 2 tablets (40 mEq total) by mouth 3 (three) times daily.   spironolactone 25 MG tablet Commonly known as: ALDACTONE Take 25 mg by mouth daily.   torsemide 20 MG tablet Commonly known as: Demadex Take 3 tablets (60 mg total) by mouth 2 (two) times daily.      Major procedures and Radiology Reports - PLEASE review detailed and final reports  for all details, in brief -   DG Chest 2 View  Result Date: 09/20/2020 CLINICAL DATA:  Persistent fluid retention over the last 3 months with weight gain and intermittent shortness of breath. Former smoker. EXAM: CHEST - 2 VIEW COMPARISON:  Radiographs 09/17/2020 and 09/15/2020. FINDINGS: The heart size and mediastinal contours are stable. Compared with the most recent study, there are mildly increased diffuse interstitial opacities most consistent with edema. There is associated fissural thickening and a stable small right pleural effusion. No confluent airspace opacity or pneumothorax. The bones appear unchanged. Telemetry leads overlie the chest. IMPRESSION: Mildly increased diffuse interstitial opacities most consistent with pulmonary edema. Electronically Signed   By: Richardean Sale M.D.   On: 09/20/2020 08:45   DG Chest 2 View  Result Date: 09/17/2020 CLINICAL DATA:  Shortness of breath, fluid retention EXAM: CHEST - 2 VIEW COMPARISON:  09/15/2020 FINDINGS: No significant change in chest radiographs with mild, diffuse interstitial opacity and a small right pleural effusion. No new or focal airspace opacity. The heart and mediastinum are unremarkable. Mild disc degenerative disease of the thoracic spine. IMPRESSION: No significant change in chest radiographs with mild, diffuse interstitial opacity and a small right pleural effusion, likely edema. No new or focal airspace opacity. Electronically Signed   By: Eddie Candle M.D.   On: 09/17/2020 09:00   DG Chest 2 View  Result Date: 09/15/2020 CLINICAL DATA:   Increasing shortness of breath and wheezing for 1 month. EXAM: CHEST - 2 VIEW COMPARISON:  Single-view of the chest 02/06/2020. FINDINGS: The patient has a small right pleural effusion. There is pulmonary edema. No consolidative process or pneumothorax. Heart size is upper normal. IMPRESSION: Pulmonary edema with a small right pleural effusion. Electronically Signed   By: Inge Rise M.D.   On: 09/15/2020 14:25   ECHOCARDIOGRAM COMPLETE  Result Date: 09/16/2020    ECHOCARDIOGRAM REPORT   Patient Name:   RYVER ZADROZNY Date of Exam: 09/16/2020 Medical Rec #:  [id number]      Height:       63.0 in Accession #:    [id number]     Weight:       358.0 lb Date of Birth:  December 26, 1975      BSA:          2.477 m Patient Age:    5 years       BP:           113/82 mmHg Patient Gender: F              HR:           89 bpm. Exam Location:  [facility] Procedure: 2D Echo Indications:     Dyspnea R06.00  History:         Patient has prior history of Echocardiogram examinations, most                  recent 03/18/2020. Risk Factors:Former Smoker. Acute Kidney                  Injury, GERD, Prolonged QT interval.  Sonographer:     Leavy Cella RDCS (AE) Referring Phys:  [id number] Tobe Sos ADEFESO Diagnosing Phys: Carlyle Dolly MD IMPRESSIONS  1. Left ventricular ejection fraction, by estimation, is 65 to 70%. The left ventricle has normal function. The left ventricle has no regional wall motion abnormalities. Left ventricular diastolic parameters were normal.  2. RV not well visualized. Grossly appears enlarged. Function appears to be decreased  but difficult to quantify. Intermittent septal bounce likely secondary to RV strain, as there is no echocardiographic signs of constrictive pericarditis (normal mitral  inflow, normal MV tissue Doppler). . Right ventricular systolic function was not well visualized. The right ventricular size is not well visualized.  3. The mitral valve is normal in structure. No evidence of mitral valve  regurgitation. No evidence of mitral stenosis.  4. The aortic valve was not well visualized. Aortic valve regurgitation is not visualized. No aortic stenosis is present. FINDINGS  Left Ventricle: Left ventricular ejection fraction, by estimation, is 65 to 70%. The left ventricle has normal function. The left ventricle has no regional wall motion abnormalities. Definity contrast agent was given IV to delineate the left ventricular  endocardial borders. The left ventricular internal cavity size was normal in size. There is no left ventricular hypertrophy. Left ventricular diastolic parameters were normal. Right Ventricle: RV not well visualized. Grossly appears enlarged. Function appears to be decreased but difficult to quantify. Intermittent septal bounce likely secondary to RV strain, as there is no echocardiographic signs of constrictive pericarditis (normal mitral inflow, normal MV tissue Doppler). The right ventricular size is not well visualized. Right vetricular wall thickness was not well visualized. Right ventricular systolic function was not well visualized. Left Atrium: Left atrial size was normal in size. Right Atrium: Right atrial size was not well visualized. Pericardium: There is no evidence of pericardial effusion. Mitral Valve: The mitral valve is normal in structure. No evidence of mitral valve regurgitation. No evidence of mitral valve stenosis. Tricuspid Valve: The tricuspid valve is not well visualized. Tricuspid valve regurgitation is trivial. No evidence of tricuspid stenosis. Aortic Valve: The aortic valve was not well visualized. Aortic valve regurgitation is not visualized. No aortic stenosis is present. Aortic valve mean gradient measures 3.0 mmHg. Aortic valve peak gradient measures 6.2 mmHg. Aortic valve area, by VTI measures 2.54 cm. Pulmonic Valve: The pulmonic valve was not well visualized. Pulmonic valve regurgitation is not visualized. No evidence of pulmonic stenosis. Aorta: The  aortic root is normal in size and structure. Venous: The inferior vena cava was not well visualized. IAS/Shunts: The interatrial septum was not well visualized.  LEFT VENTRICLE PLAX 2D LVIDd:         4.25 cm  Diastology LVIDs:         2.60 cm  LV e' medial:    10.40 cm/s LV PW:         0.90 cm  LV E/e' medial:  10.4 LV IVS:        0.82 cm  LV e' lateral:   11.50 cm/s LVOT diam:     1.80 cm  LV E/e' lateral: 9.4 LV SV:         53 LV SV Index:   21 LVOT Area:     2.54 cm  RIGHT VENTRICLE TAPSE (M-mode): 1.6 cm LEFT ATRIUM             Index       RIGHT ATRIUM           Index LA diam:        3.50 cm 1.41 cm/m  RA Area:     14.70 cm LA Vol (A2C):   59.4 ml 23.99 ml/m RA Volume:   40.40 ml  16.31 ml/m LA Vol (A4C):   31.9 ml 12.88 ml/m LA Biplane Vol: 44.2 ml 17.85 ml/m  AORTIC VALVE AV Area (Vmax):    2.21 cm AV Area (Vmean):  2.24 cm AV Area (VTI):     2.54 cm AV Vmax:           124.23 cm/s AV Vmean:          79.476 cm/s AV VTI:            0.207 m AV Peak Grad:      6.2 mmHg AV Mean Grad:      3.0 mmHg LVOT Vmax:         107.75 cm/s LVOT Vmean:        69.931 cm/s LVOT VTI:          0.207 m LVOT/AV VTI ratio: 1.00  AORTA Ao Root diam: 2.80 cm MITRAL VALVE                TRICUSPID VALVE MV Area (PHT): 3.76 cm     TR Peak grad:   15.1 mmHg MV Decel Time: 202 msec     TR Vmax:        194.00 cm/s MV E velocity: 108.00 cm/s MV A velocity: 58.00 cm/s   SHUNTS MV E/A ratio:  1.86         Systemic VTI:  0.21 m                             Systemic Diam: 1.80 cm Carlyle Dolly MD Electronically signed by Carlyle Dolly MD Signature Date/Time: 09/16/2020/11:16:17 AM    Final (Updated)    US Abdomen Limited RUQ (LIVER/GB)  Result Date: 09/08/2020 CLINICAL DATA:  Abnormal LFTs for 6 months, history hypertension EXAM: ULTRASOUND ABDOMEN LIMITED RIGHT UPPER QUADRANT COMPARISON:  None FINDINGS: Gallbladder: Suboptimally assessed due to body habitus. Large shadowing echogenic focus seen within gallbladder 5.3 cm length  likely a large gallstone versus less likely multiple tiny calculi. Mildly thickened gallbladder wall. No pericholecystic fluid. No sonographic Murphy sign. Common bile duct: Diameter: 4 mm Liver: Normal parenchymal echogenicity. Hyperechoic mass RIGHT lobe 3.4 x 3.8 x 2.7 cm. No additional masses identified. Portal vein is patent on color Doppler imaging with normal direction of blood flow towards the liver. Other: No RIGHT upper quadrant free fluid. IMPRESSION: Cholelithiasis with probable large single calculus 5.3 cm diameter versus less likely multiple tiny dependent calculi within gallbladder. No biliary dilatation. Hyperechoic mass RIGHT lobe liver 3.8 cm in size; finding is nonspecific, could potentially represent a hepatic hemangioma but is not diagnostic and other etiologies are not excluded; further characterization by MR imaging with and without contrast recommended. Electronically Signed   By: Lavonia Dana M.D.   On: 09/08/2020 10:23    Micro Results   Recent Results (from the past 240 hour(s))  Resp Panel by RT-PCR (Flu A&B, Covid) Nasopharyngeal Swab     Status: None   Collection Time: 09/15/20 11:01 PM   Specimen: Nasopharyngeal Swab; Nasopharyngeal(NP) swabs in vial transport medium  Result Value Ref Range Status   SARS Coronavirus 2 by RT PCR NEGATIVE NEGATIVE Final    Comment: (NOTE) SARS-CoV-2 target nucleic acids are NOT DETECTED.  The SARS-CoV-2 RNA is generally detectable in upper respiratory specimens during the acute phase of infection. The lowest concentration of SARS-CoV-2 viral copies this assay can detect is 138 copies/mL. A negative result does not preclude SARS-Cov-2 infection and should not be used as the sole basis for treatment or other patient management decisions. A negative result may occur with  improper specimen collection/handling, submission of specimen other than nasopharyngeal swab, presence  of viral mutation(s) within the areas targeted by this assay,  and inadequate number of viral copies(<138 copies/mL). A negative result must be combined with clinical observations, patient history, and epidemiological information. The expected result is Negative.  Fact Sheet for Patients:  EntrepreneurPulse.com.au  Fact Sheet for Healthcare Providers:  IncredibleEmployment.be  This test is no t yet approved or cleared by the [place] FDA and  has been authorized for detection and/or diagnosis of SARS-CoV-2 by FDA under an Emergency Use Authorization (EUA). This EUA will remain  in effect (meaning this test can be used) for the duration of the COVID-19 declaration under Section 564(b)(1) of the Act, 21 U.S.C.section 360bbb-3(b)(1), unless the authorization is terminated  or revoked sooner.       Influenza A by PCR NEGATIVE NEGATIVE Final   Influenza B by PCR NEGATIVE NEGATIVE Final    Comment: (NOTE) The Xpert Xpress SARS-CoV-2/FLU/RSV plus assay is intended as an aid in the diagnosis of influenza from Nasopharyngeal swab specimens and should not be used as a sole basis for treatment. Nasal washings and aspirates are unacceptable for Xpert Xpress SARS-CoV-2/FLU/RSV testing.  Fact Sheet for Patients: EntrepreneurPulse.com.au  Fact Sheet for Healthcare Providers: IncredibleEmployment.be  This test is not yet approved or cleared by the [place] FDA and has been authorized for detection and/or diagnosis of SARS-CoV-2 by FDA under an Emergency Use Authorization (EUA). This EUA will remain in effect (meaning this test can be used) for the duration of the COVID-19 declaration under Section 564(b)(1) of the Act, 21 U.S.C. section 360bbb-3(b)(1), unless the authorization is terminated or revoked.  Performed at [facility], [address], [place], [place] [address]     Today   [place] today has no new concerns -Ambulated in hallway with  her daughter, no dyspnea on exertion  O2 sats post ambulation were 98 to 100% on room air      Patient has been seen and examined prior to discharge   Objective   Blood pressure 115/75, pulse 84, temperature 97.6 F (36.4 C), temperature source Oral, resp. rate 18, height 5\' 3"  (1.6 m), weight (!) 149.6 kg, SpO2 97 %.   Intake/Output Summary (Last 24 hours) at 09/22/2020 1757 Last data filed at 09/22/2020 1500 Gross per 24 hour  Intake 630.29 ml  Output 1700 ml  Net -1069.71 ml   Exam  Gen:- Awake Alert, morbidly obese, no conversational dyspnea HEENT:- [place].AT, No sclera icterus Neck-Supple Neck, Lungs-improving air movement, no wheezing CV- S1, S2 normal, regular ,  Abd-  +ve B.Sounds, Abd Soft, No tenderness, increased truncal adiposity    Extremity/Skin:-Improved bilateral lower extremity edema (2+LE ), pedal pulses present  Psych-affect is appropriate, oriented x3 Neuro-generalized weakness no new focal deficits, no tremors   Data Review   CBC w Diff:  Lab Results  Component Value Date   WBC 6.7 09/22/2020   HGB 12.1 09/22/2020   HCT 39.3 09/22/2020   PLT 230 09/22/2020   LYMPHOPCT 18 08/02/2020   MONOPCT 7 08/02/2020   EOSPCT 2 08/02/2020   BASOPCT 1 08/02/2020    CMP:  Lab Results  Component Value Date   NA 140 09/22/2020   NA 139 09/22/2020   K 2.9 (L) 09/22/2020   K 2.8 (L) 09/22/2020   CL 94 (L) 09/22/2020   CL 94 (L) 09/22/2020   CO2 32 09/22/2020   CO2 32 09/22/2020   BUN 21 (H) 09/22/2020   BUN 21 (H) 09/22/2020  CREATININE 1.55 (H) 09/22/2020   CREATININE 1.57 (H) 09/22/2020   PROT 6.7 09/16/2020   ALBUMIN 3.9 09/22/2020   BILITOT 2.4 (H) 09/16/2020   ALKPHOS 49 09/16/2020   AST 26 09/16/2020   ALT 14 09/16/2020  .   Total Discharge time is about 33 minutes  Roxan Hockey M.D on 09/22/2020 at 5:57 PM  Go to [email or web address] -  for contact info  [facility] - Office  [phone]

## 2020-09-23 ENCOUNTER — Telehealth: Payer: Self-pay

## 2020-09-23 NOTE — Telephone Encounter (Signed)
Transition Care Management Unsuccessful Follow-up Telephone Call  Date of discharge and from where:  07/13/2021-[facility]   Attempts:  1st Attempt  Reason for unsuccessful TCM follow-up call:  Left voice message    

## 2020-09-24 ENCOUNTER — Encounter: Payer: Self-pay | Admitting: Student

## 2020-09-24 ENCOUNTER — Ambulatory Visit (INDEPENDENT_AMBULATORY_CARE_PROVIDER_SITE_OTHER): Payer: Managed Care, Other (non HMO) | Admitting: Student

## 2020-09-24 ENCOUNTER — Other Ambulatory Visit: Payer: Self-pay

## 2020-09-24 VITALS — BP 140/88 | HR 109 | Ht 63.0 in | Wt 329.0 lb

## 2020-09-24 DIAGNOSIS — R0609 Other forms of dyspnea: Secondary | ICD-10-CM

## 2020-09-24 DIAGNOSIS — N289 Disorder of kidney and ureter, unspecified: Secondary | ICD-10-CM | POA: Diagnosis not present

## 2020-09-24 DIAGNOSIS — Z79899 Other long term (current) drug therapy: Secondary | ICD-10-CM

## 2020-09-24 DIAGNOSIS — Z0189 Encounter for other specified special examinations: Secondary | ICD-10-CM | POA: Diagnosis not present

## 2020-09-24 DIAGNOSIS — I5031 Acute diastolic (congestive) heart failure: Secondary | ICD-10-CM | POA: Diagnosis not present

## 2020-09-24 DIAGNOSIS — R0600 Dyspnea, unspecified: Secondary | ICD-10-CM | POA: Diagnosis not present

## 2020-09-24 DIAGNOSIS — I10 Essential (primary) hypertension: Secondary | ICD-10-CM | POA: Diagnosis not present

## 2020-09-24 NOTE — Patient Instructions (Signed)
Medication Instructions:  Your physician recommends that you continue on your current medications as directed. Please refer to the Current Medication list given to you today.  *If you need a refill on your cardiac medications before your next appointment, please call your pharmacy*   Lab Work: BMET- Monday If you have labs (blood work) drawn today and your tests are completely normal, you will receive your results only by: Marland Kitchen MyChart Message (if you have MyChart) OR . A paper copy in the mail If you have any lab test that is abnormal or we need to change your treatment, we will call you to review the results.   Testing/Procedures: None Today   Follow-Up: At [facility], you and your health needs are our priority.  As part of our continuing mission to provide you with exceptional heart care, we have created designated Provider Care Teams.  These Care Teams include your primary Cardiologist (physician) and Advanced Practice Providers (APPs -  Physician Assistants and Nurse Practitioners) who all work together to provide you with the care you need, when you need it.  We recommend signing up for the patient portal called "MyChart".  Sign up information is provided on this After Visit Summary.  MyChart is used to connect with patients for Virtual Visits (Telemedicine).  Patients are able to view lab/test results, encounter notes, upcoming appointments, etc.  Non-urgent messages can be sent to your provider as well.   To learn more about what you can do with MyChart, go to [email or web address].    Your next appointment:   3-4 week(s)  The format for your next appointment:   In Person  Provider:   You may see Rozann Lesches, MD or one of the following Advanced Practice Providers on your designated Care Team:    Bernerd Pho, [place]      Other Instructions Referral to [facility] Pulmonology- Dr. Bari Mantis office will contact you.

## 2020-09-24 NOTE — Progress Notes (Signed)
Cardiology Office Note    Date:  09/25/2020   ID:  ELAURA CALIX, DOB 03-06-1976, MRN [id number]  PCP:  [name], MD  Cardiologist: Rozann Lesches, MD    Chief Complaint  Patient presents with  . Hospitalization Follow-up    History of Present Illness:    Lauren Osborn is a 45 y.o. female with past medical history of chronic diastolic CHF, HTN and GERD who presents to the office today for hospital follow-up.   She was evaluated by Dr. Domenic Polite earlier this month and reported a 60+ lb weight gain within a few months and Lasix was titrated but symptoms continued to worsen which led to ED evaluation. Was admitted to [facility] on 09/15/2020 and BNP was elevated at 318 and creatinine was at 1.48. She was started on IV Lasix which was titrated during her admission and a Lasix drip was also used with improvement in her symptoms. A repeat echo was obtained which showed a preserved EF of 65-70% with no regional WMA but RV function appeared to be decreased and it was recommended she have an outpatient sleep study. Her dyspnea continued to improve and she was transitioned to Torsemide 60mg  BID at the time of discharge to continue with outpatient diuresis. Weight at the time of discharge was 329 lbs (declined by 29 lbs during admission) and a recorded net output of -18 L. Creatinine was elevated to 1.57 at the time of discharge (09/22/2020).  In talking with the patient today, she reports only taking Torsemide once yesterday due to the time she picked it up from the pharmacy but experienced frequent urination yesterday and today. She has purchased scales to follow her weight at home. She continues to have dyspnea on exertion but this has improved. No recent orthopnea or PND. No chest pain or palpitations. She does wish to return to work next week (works as a Presenter, broadcasting).   Past Medical History:  Diagnosis Date  . Essential hypertension   . Fibroids   . Gout   . Thyroid nodule     Past  Surgical History:  Procedure Laterality Date  . BIOPSY  08/03/2020   Procedure: BIOPSY;  Surgeon: Eloise Harman, DO;  [facility];  Service: Endoscopy;;  duodenal gastric  . CESAREAN SECTION    . COLONOSCOPY WITH PROPOFOL N/A 08/03/2020   Procedure: COLONOSCOPY WITH PROPOFOL;  Surgeon: Eloise Harman, DO;  Location[facility];  Service: Endoscopy;  Laterality: N/A;  1:15pm-rescheduled to 11/30 @ 8:00am  . ESOPHAGOGASTRODUODENOSCOPY (EGD) WITH PROPOFOL N/A 08/03/2020   Procedure: ESOPHAGOGASTRODUODENOSCOPY (EGD) WITH PROPOFOL;  Surgeon: Eloise Harman, DO;  [facility];  Service: Endoscopy;  Laterality: N/A;  . HYSTERECTOMY ABDOMINAL WITH SALPINGECTOMY  2008  . POLYPECTOMY  08/03/2020   Procedure: POLYPECTOMY;  Surgeon: Eloise Harman, DO;  Location[facility];  Service: Endoscopy;;  colon  . THYROIDECTOMY Right 10/21/2018   Procedure: RIGHT HEMITHYROIDECTOMY;  Surgeon: Leta Baptist, MD;  Location: [place];  Service: ENT;  Laterality: Right;    Current Medications: Outpatient Medications Prior to Visit  Medication Sig Dispense Refill  . acetaminophen (TYLENOL) 500 MG tablet Take 1,000 mg by mouth every 6 (six) hours as needed for moderate pain or headache.    . FEROSUL 325 (65 Fe) MG tablet Take 325 mg by mouth 2 (two) times daily.    . Magnesium Oxide 400 MG CAPS Take 1 capsule (400 mg total) by mouth daily.  30 capsule 2  . omeprazole (PRILOSEC) 20 MG capsule Take 1 capsule (20 mg total) by mouth 2 (two) times daily. 60 capsule 5  . potassium chloride SA (KLOR-CON) 20 MEQ tablet Take 2 tablets (40 mEq total) by mouth 3 (three) times daily. 180 tablet 2  . spironolactone (ALDACTONE) 25 MG tablet Take 25 mg by mouth daily.    Marland Kitchen torsemide (DEMADEX) 20 MG tablet Take 3 tablets (60 mg total) by mouth 2 (two) times daily. 120 tablet 2   No facility-administered medications prior to visit.     Allergies:   Oxycodone-acetaminophen    Social History   Socioeconomic History  . Marital status: Single    Spouse name: Not on file  . Number of children: Not on file  . Years of education: Not on file  . Highest education level: Not on file  Occupational History  . Not on file  Tobacco Use  . Smoking status: Former Smoker    Packs/day: 0.50    Years: 20.00    Pack years: 10.00    Types: Cigarettes    Quit date: 2017    Years since quitting: 5.0  . Smokeless tobacco: Never Used  Vaping Use  . Vaping Use: Never used  Substance and Sexual Activity  . Alcohol use: Not Currently  . Drug use: Never  . Sexual activity: Yes    Comment: hyst  Other Topics Concern  . Not on file  Social History Narrative  . Not on file   Social Determinants of Health   Financial Resource Strain: Not on file  Food Insecurity: Not on file  Transportation Needs: Not on file  Physical Activity: Not on file  Stress: Not on file  Social Connections: Not on file     Family History:  The patient's family history includes Breast cancer in her mother; Colon cancer (age of onset: 52) in her father; Colon polyps (age of onset: 14) in her sister; Congestive Heart Failure in her maternal grandfather; Diabetes in her daughter; Diverticulitis in her brother and sister; Other in her maternal grandmother and paternal grandfather.   Review of Systems:   Please see the history of present illness.     General:  No chills, fever, night sweats or weight changes.  Cardiovascular:  No chest pain, orthopnea, palpitations, paroxysmal nocturnal dyspnea. Positive for dyspnea on exertion and edema.  Dermatological: No rash, lesions/masses Respiratory: No cough, dyspnea Urologic: No hematuria, dysuria Abdominal:   No nausea, vomiting, diarrhea, bright red blood per rectum, melena, or hematemesis Neurologic:  No visual changes, wkns, changes in mental status. All other systems reviewed and are otherwise negative except as noted above.   Physical Exam:     VS:  BP 140/88   Pulse (!) 109   Ht 5\' 3"  (1.6 m)   Wt (!) 329 lb (149.2 kg)   SpO2 96%   BMI 58.28 kg/m    General: Pleasant obese female appearing in no acute distress. Head: Normocephalic, atraumatic. Neck: No carotid bruits. JVD not elevated.  Lungs: Respirations regular and unlabored, without wheezes or rales.  Heart: Regular rate and rhythm. No S3 or S4.  No murmur, no rubs, or gallops appreciated. Abdomen: Appears non-distended. No obvious abdominal masses. Msk:  Strength and tone appear normal for age. No obvious joint deformities or effusions. Extremities: No clubbing or cyanosis. 1+ pitting edema bilaterally, more notable along LLE.  Distal pedal pulses are 2+ bilaterally. Neuro: Alert and oriented X 3. Moves all extremities spontaneously.  No focal deficits noted. Psych:  Responds to questions appropriately with a normal affect. Skin: No rashes or lesions noted  Wt Readings from Last 3 Encounters:  09/24/20 (!) 329 lb (149.2 kg)  09/22/20 (!) 329 lb 12.8 oz (149.6 kg)  09/09/20 (!) 351 lb (159.2 kg)     Studies/Labs Reviewed:   EKG:  EKG is not ordered today.    Recent Labs: 09/15/2020: B Natriuretic Peptide 318.0 09/16/2020: ALT 14 09/17/2020: TSH 4.037 09/22/2020: BUN 21; BUN 21; Creatinine, Ser 1.55; Creatinine, Ser 1.57; Hemoglobin 12.1; Magnesium 1.8; Platelets 230; Potassium 2.9; Potassium 2.8; Sodium 140; Sodium 139   Lipid Panel No results found for: CHOL, TRIG, HDL, CHOLHDL, VLDL, LDLCALC, LDLDIRECT  Additional studies/ records that were reviewed today include:   Echocardiogram: 09/2020 IMPRESSIONS    1. Left ventricular ejection fraction, by estimation, is 65 to 70%. The  left ventricle has normal function. The left ventricle has no regional  wall motion abnormalities. Left ventricular diastolic parameters were  normal.  2. RV not well visualized. Grossly appears enlarged. Function appears to  be decreased but difficult to quantify. Intermittent  septal bounce likely  secondary to RV strain, as there is no echocardiographic signs of  constrictive pericarditis (normal mitral  inflow, normal MV tissue Doppler). . Right ventricular systolic function  was not well visualized. The right ventricular size is not well  visualized.  3. The mitral valve is normal in structure. No evidence of mitral valve  regurgitation. No evidence of mitral stenosis.  4. The aortic valve was not well visualized. Aortic valve regurgitation  is not visualized. No aortic stenosis is present.   Assessment:    1. Acute diastolic heart failure ([place])   2. Dyspnea on exertion   3. Medication management   4. Essential hypertension   5. Need for assessment for sleep apnea   6. Acute renal insufficiency      Plan:   In order of problems listed above:  1. Acute Diastolic CHF Exacerbation/Dyspnea on Exertion - While she diuresed almost 30 lbs during her most recent admission, her weight still remains 30-35 lbs above baseline. Hopefully she will continue to experience good diuresis with Torsemide. Will continue Torsemide 60mg  BID. The lab is already closed for today so will recheck a BMET on Monday for reassessment of renal function and electrolytes. She was encouraged to continue to follow daily weights at home and report back if this starts to increase as we may need to add intermittent Metolazone.   2. HTN - BP was slightly elevated at 140/88 during today's visit. Continue Torsemide and Spironolactone at current dosing for now. Will continue to follow BP with diuresis as this should continue to improve with weight loss.   3. Need for Sleep Study - Recent echocardiogram showed her RV function appeared to be decreased and it was recommended she have an outpatient sleep study. Will refer to [facility]monology here in [place].  4. Acute Renal Insufficiency - Her creatinine was elevated to 1.57 on the time of hospital discharge on 09/22/2020. Will recheck a  BMET on Monday.     Medication Adjustments/Labs and Tests Ordered: Current medicines are reviewed at length with the patient today.  Concerns regarding medicines are outlined above.  Medication changes, Labs and Tests ordered today are listed in the Patient Instructions below. Patient Instructions  Medication Instructions:  Your physician recommends that you continue on your current medications as directed. Please refer to the Current Medication list given to you  today.  *If you need a refill on your cardiac medications before your next appointment, please call your pharmacy*   Lab Work: BMET- Monday If you have labs (blood work) drawn today and your tests are completely normal, you will receive your results only by: Marland Kitchen MyChart Message (if you have MyChart) OR . A paper copy in the mail If you have any lab test that is abnormal or we need to change your treatment, we will call you to review the results.   Testing/Procedures: None Today   Follow-Up: At [facility], you and your health needs are our priority.  As part of our continuing mission to provide you with exceptional heart care, we have created designated Provider Care Teams.  These Care Teams include your primary Cardiologist (physician) and Advanced Practice Providers (APPs -  Physician Assistants and Nurse Practitioners) who all work together to provide you with the care you need, when you need it.  We recommend signing up for the patient portal called "MyChart".  Sign up information is provided on this After Visit Summary.  MyChart is used to connect with patients for Virtual Visits (Telemedicine).  Patients are able to view lab/test results, encounter notes, upcoming appointments, etc.  Non-urgent messages can be sent to your provider as well.   To learn more about what you can do with MyChart, go to [email or web address].    Your next appointment:   3-4 week(s)  The format for your next appointment:   In  Person  Provider:   You may see Rozann Lesches, MD or one of the following Advanced Practice Providers on your designated Care Team:    Bernerd Pho, [place]      Other Instructions Referral to [facility] Pulmonology- Dr. Bari Mantis office will contact you.      Signed, Erma Heritage, PA-C  09/25/2020 9:54 AM    [place] [address] [place], [place] [address] Phone: [phone] Fax: [phone]

## 2020-09-25 ENCOUNTER — Encounter: Payer: Self-pay | Admitting: Student

## 2020-09-27 ENCOUNTER — Other Ambulatory Visit: Payer: Self-pay

## 2020-09-27 ENCOUNTER — Other Ambulatory Visit (HOSPITAL_COMMUNITY)
Admission: RE | Admit: 2020-09-27 | Discharge: 2020-09-27 | Disposition: A | Discharge status: Home / Self Care | Payer: Managed Care, Other (non HMO) | Source: Ambulatory Visit | Attending: Student | Admitting: Student

## 2020-09-27 ENCOUNTER — Telehealth: Payer: Self-pay

## 2020-09-27 DIAGNOSIS — Z79899 Other long term (current) drug therapy: Secondary | ICD-10-CM | POA: Diagnosis not present

## 2020-09-27 LAB — BASIC METABOLIC PANEL
Anion gap: 15 (ref 5–15)
BUN: 21 mg/dL — ABNORMAL HIGH (ref 6–20)
CO2: 31 mmol/L (ref 22–32)
Calcium: 9.2 mg/dL (ref 8.9–10.3)
Chloride: 91 mmol/L — ABNORMAL LOW (ref 98–111)
Creatinine, Ser: 1.8 mg/dL — ABNORMAL HIGH (ref 0.44–1.00)
GFR, Estimated: 35 mL/min — ABNORMAL LOW (ref >60)
Glucose, Bld: 102 mg/dL — ABNORMAL HIGH (ref 70–99)
Potassium: 3.2 mmol/L — ABNORMAL LOW (ref 3.5–5.1)
Sodium: 137 mmol/L (ref 135–145)

## 2020-09-27 MED ORDER — TORSEMIDE 20 MG PO TABS: 40.0000 mg | ORAL_TABLET | Freq: Two times a day (BID) | ORAL | 3 refills | Status: DC

## 2020-09-27 NOTE — Telephone Encounter (Signed)
-----   Message from Erma Heritage, [place] sent at 09/27/2020  2:17 PM EST ----- Please let the patient know her kidney function has worsened since hospital discharge. I would recommend she reduce her Torsemide to 40mg  BID. K+ is low but has improved. Please confirm she is taking K-dur 40 mEq TID. K+ should improve with dose reduction of Torsemide but would recheck BMET again in one week. Please forward a copy of results to [name], MD.

## 2020-09-27 NOTE — Telephone Encounter (Signed)
Notified patient and she verbalized understanding. Decreased he Torsemide to 40 mg BID and confirmed patient was taking the K-Dur TID. Sent a copy of the results to Dr. Legrand Rams (Patient PCP) and put in an order for BMET in 1 week.

## 2020-09-28 NOTE — Telephone Encounter (Signed)
Patient has been in to see Cardiology on 09/24/2020 and has upcoming appointment on 10/18/2020 at 3:30pm.

## 2020-10-01 ENCOUNTER — Other Ambulatory Visit: Payer: Self-pay

## 2020-10-01 ENCOUNTER — Telehealth: Payer: Self-pay | Admitting: Cardiology

## 2020-10-01 MED ORDER — TORSEMIDE 20 MG PO TABS
ORAL_TABLET | ORAL | 3 refills | Status: DC
Start: 2020-10-01 — End: 2020-12-07

## 2020-10-01 NOTE — Progress Notes (Signed)
Medication dose change (Torsemide 60 mg in the am and 40 mg in the pm)

## 2020-10-01 NOTE — Telephone Encounter (Signed)
Pt c/o swelling: STAT is pt has developed SOB within 24 hours  1) How much weight have you gained and in what time span? 4lbs this week 2 over night   2) If swelling, where is the swelling located? Below the waist    3) Are you currently taking a fluid pill?  yes  4) Are you currently SOB?  yes  5) Do you have a log of your daily weights (if so, list)?  Yes  1/20 330 1/21 329 1/22 329 1/23 327 1/24 326 1/25 324 1/26 325 1/27 328 1/28 331  6) Have you gained 3 pounds in a day or 5 pounds in a week? yes  7) Have you traveled recently? no

## 2020-10-01 NOTE — Telephone Encounter (Signed)
Spoke with patient and we went over her lasix medication with k-dur once again. She informed me that she was not taking the correct K-Dur dose because she had not picked up her new prescription yet.

## 2020-10-01 NOTE — Telephone Encounter (Signed)
Patient contacted and verbalized understading.

## 2020-10-01 NOTE — Telephone Encounter (Signed)
    Given her weight gain, please have her increase Torsemide to 60mg  in AM/40mg  in PM. Report back with weight next week. If still trending up, may need to use PRN Metolazone. Continue with plans for BMET next week.   Signed, Erma Heritage, PA-C 10/01/2020, 10:06 AM Pager: [phone]

## 2020-10-20 NOTE — Progress Notes (Signed)
Cardiology Office Note    Date:  10/21/2020   ID:  Lauren Osborn, DOB 23-Aug-1976, MRN [id number]  PCP:  [name], MD  Cardiologist: Rozann Lesches, MD    Chief Complaint  Patient presents with  . Follow-up    3 week visit    History of Present Illness:    Lauren Osborn is a 45 y.o. female with past medical history of chronic diastolic CHF, HTN and GERD who presents to the office today for 3-week follow-up.  She was last examined by myself on 09/24/2020 for hospital follow-up as she had recently been admitted for an acute CHF exacerbation and her weight had declined by 29 pounds during admission, at 329 lbs at the time of discharge. At the time of her follow-up visit, she had only taken Torsemide once due to the pharmacy not having this ready. Her weight was still at 329 lbs and she reported a baseline weight of 295 - 300 lbs, therefore she was encouraged to continue on Torsemide 60 mg twice daily with outpatient labs arranged. She was also referred to Pulmonology for consideration of an outpatient sleep study given her body habitus and decreased RV function by recent echocardiogram.  Follow-up labs on 09/27/2020 showed her creatinine had increased from 1.55 to 1.80, therefore was recommended she reduce Torsemide to 40 mg twice daily. She called back the following week and her weight had increased by over 5 pounds on her home scales, therefore this was increased to 60mg  in AM/40mg  in PM.   In talking with the patient today, she reports her weight has continued to increase since her last visit. The lowest her weight had been was at 324 lbs on 09/28/2020 and was at 335 lbs on her home scales today with similar results of 336 lbs on the office scales. She reports worsening dyspnea on exertion along with abdominal distention and orthopnea. She was referred to Pulmonology at the time of her last visit for OSA but reports her first appointment is not scheduled until 11/2020. No reported chest  pain or palpitations.   Past Medical History:  Diagnosis Date  . Diastolic congestive heart failure ([place])   . Essential hypertension   . Fibroids   . Gout   . Thyroid nodule     Past Surgical History:  Procedure Laterality Date  . BIOPSY  08/03/2020   Procedure: BIOPSY;  Surgeon: Eloise Harman, DO;  [facility];  Service: Endoscopy;;  duodenal gastric  . CESAREAN SECTION    . COLONOSCOPY WITH PROPOFOL N/A 08/03/2020   Procedure: COLONOSCOPY WITH PROPOFOL;  Surgeon: Eloise Harman, DO;  Location: [facility];  Service: Endoscopy;  Laterality: N/A;  1:15pm-rescheduled to 11/30 @ 8:00am  . ESOPHAGOGASTRODUODENOSCOPY (EGD) WITH PROPOFOL N/A 08/03/2020   Procedure: ESOPHAGOGASTRODUODENOSCOPY (EGD) WITH PROPOFOL;  Surgeon: Eloise Harman, DO;  [facility];  Service: Endoscopy;  Laterality: N/A;  . HYSTERECTOMY ABDOMINAL WITH SALPINGECTOMY  2008  . POLYPECTOMY  08/03/2020   Procedure: POLYPECTOMY;  Surgeon: Eloise Harman, DO;  [facility];  Service: Endoscopy;;  colon  . THYROIDECTOMY Right 10/21/2018   Procedure: RIGHT HEMITHYROIDECTOMY;  Surgeon: Leta Baptist, MD;  Location: [facility];  Service: ENT;  Laterality: Right;    Current Medications: Outpatient Medications Prior to Visit  Medication Sig Dispense Refill  . acetaminophen (TYLENOL) 500 MG tablet Take 1,000 mg by mouth every 6 (six) hours as needed for moderate pain or headache.    Marland Kitchen  FEROSUL 325 (65 Fe) MG tablet Take 325 mg by mouth 2 (two) times daily.    . Magnesium Oxide 400 MG CAPS Take 1 capsule (400 mg total) by mouth daily. 30 capsule 2  . omeprazole (PRILOSEC) 20 MG capsule Take 1 capsule (20 mg total) by mouth 2 (two) times daily. 60 capsule 5  . potassium chloride SA (KLOR-CON) 20 MEQ tablet Take 2 tablets (40 mEq total) by mouth 3 (three) times daily. 180 tablet 2  . torsemide (DEMADEX) 20 MG tablet Take 60 mg (3 tablets) in the am and 40 mg ( 2 tablets)  in the pm 150 tablet 3  . spironolactone (ALDACTONE) 25 MG tablet Take 25 mg by mouth daily.     No facility-administered medications prior to visit.     Allergies:   Oxycodone-acetaminophen   Social History   Socioeconomic History  . Marital status: Single    Spouse name: Not on file  . Number of children: Not on file  . Years of education: Not on file  . Highest education level: Not on file  Occupational History  . Not on file  Tobacco Use  . Smoking status: Former Smoker    Packs/day: 0.50    Years: 20.00    Pack years: 10.00    Types: Cigarettes    Quit date: 2017    Years since quitting: 5.1  . Smokeless tobacco: Never Used  Vaping Use  . Vaping Use: Never used  Substance and Sexual Activity  . Alcohol use: Not Currently  . Drug use: Never  . Sexual activity: Yes    Comment: hyst  Other Topics Concern  . Not on file  Social History Narrative  . Not on file   Social Determinants of Health   Financial Resource Strain: Not on file  Food Insecurity: Not on file  Transportation Needs: Not on file  Physical Activity: Not on file  Stress: Not on file  Social Connections: Not on file     Family History:  The patient's family history includes Breast cancer in her mother; Colon cancer (age of onset: 14) in her father; Colon polyps (age of onset: 68) in her sister; Congestive Heart Failure in her maternal grandfather; Diabetes in her daughter; Diverticulitis in her brother and sister; Other in her maternal grandmother and paternal grandfather.   Review of Systems:   Please see the history of present illness.     General:  No chills, fever, night sweats or weight changes.  Cardiovascular:  No chest pain, palpitations, paroxysmal nocturnal dyspnea. Positive for dyspnea on exertion, edema and orthopnea.  Dermatological: No rash, lesions/masses Respiratory: No cough, Positive for dyspnea. Urologic: No hematuria, dysuria Abdominal:   No nausea, vomiting, diarrhea,  bright red blood per rectum, melena, or hematemesis Neurologic:  No visual changes, wkns, changes in mental status. All other systems reviewed and are otherwise negative except as noted above.   Physical Exam:    VS:  BP 138/86   Pulse 98   Ht 5\' 3"  (1.6 m)   Wt (!) 336 lb (152.4 kg)   SpO2 95%   BMI 59.52 kg/m    General: Well developed, obese female appearing in no acute distress. Head: Normocephalic, atraumatic. Neck: No carotid bruits. JVD at 9 cm. Lungs: Respirations regular and unlabored, decreased breath sounds along bases bilaterally.  Heart: Regular rate and rhythm. No S3 or S4.  No murmur, no rubs, or gallops appreciated. Abdomen: Appears distended.  Msk:  Strength and tone appear  normal for age. No obvious joint deformities or effusions. Extremities: No clubbing or cyanosis. 1+ pitting edema bilaterally.  Distal pedal pulses are 2+ bilaterally. Neuro: Alert and oriented X 3. Moves all extremities spontaneously. No focal deficits noted. Psych:  Responds to questions appropriately with a normal affect. Skin: No rashes or lesions noted  Wt Readings from Last 3 Encounters:  10/21/20 (!) 336 lb (152.4 kg)  09/24/20 (!) 329 lb (149.2 kg)  09/22/20 (!) 329 lb 12.8 oz (149.6 kg)     Studies/Labs Reviewed:   EKG:  EKG is not ordered today.  Recent Labs: 09/15/2020: B Natriuretic Peptide 318.0 09/16/2020: ALT 14 09/17/2020: TSH 4.037 09/22/2020: Hemoglobin 12.1; Magnesium 1.8; Platelets 230 09/27/2020: BUN 21; Creatinine, Ser 1.80; Potassium 3.2; Sodium 137   Lipid Panel No results found for: CHOL, TRIG, HDL, CHOLHDL, VLDL, LDLCALC, LDLDIRECT  Additional studies/ records that were reviewed today include:   Echocardiogram: 09/16/2020 IMPRESSIONS    1. Left ventricular ejection fraction, by estimation, is 65 to 70%. The  left ventricle has normal function. The left ventricle has no regional  wall motion abnormalities. Left ventricular diastolic parameters were   normal.  2. RV not well visualized. Grossly appears enlarged. Function appears to  be decreased but difficult to quantify. Intermittent septal bounce likely  secondary to RV strain, as there is no echocardiographic signs of  constrictive pericarditis (normal mitral  inflow, normal MV tissue Doppler). . Right ventricular systolic function  was not well visualized. The right ventricular size is not well  visualized.  3. The mitral valve is normal in structure. No evidence of mitral valve  regurgitation. No evidence of mitral stenosis.  4. The aortic valve was not well visualized. Aortic valve regurgitation  is not visualized. No aortic stenosis is present.   Assessment:    1. Acute diastolic heart failure ([place])   2. Dyspnea on exertion   3. Medication management   4. Need for assessment for sleep apnea   5. Acute renal insufficiency      Plan:   In order of problems listed above:  1. Acute on Chronic Diastolic CHF/Dyspnea on Exertion - Recent echo showed a normal EF of 65-70% but RV function appeared to be reduced. She remains at least 30 lbs above her baseline weight of 295 - 300 lbs. She is significantly volume overloaded by examination today and I reviewed options with the patient in regards to admission for IV diuresis vs. outpatient management and she wishes to avoid rehospitalization if able. Therefore, I reviewed with Dr. Domenic Polite and will start Metolazone 2.5mg  three times weekly along with continuing Torsemide 60mg  in AM/40mg  in PM. Will stop Spironolactone for now given her rising creatinine.  - I have asked her to update [place] on her symptoms and weight next week. If no improvement, would plan for admission and would try to arrange at [facility] so Advanced Heart Failure can evaluate her as well. Will arrange for BMET in 7-10 days and follow-up in 2 weeks.   2. Sleep-Disordered Breathing - She has scheduled follow-up with Pulmonology next month and can hopefully be  scheduled for a sleep study at that time.   3. AKI - Her creatinine was previously at 0.9 - 1.0 in 2021. Elevated to 1.80 by most recent check on 09/27/2020. She did have labs with her PCP last week and I will request a copy of these. Repeat BMET again in 7-10 days given the use of Metolazone.    Medication Adjustments/Labs and  Tests Ordered: Current medicines are reviewed at length with the patient today.  Concerns regarding medicines are outlined above.  Medication changes, Labs and Tests ordered today are listed in the Patient Instructions below. Patient Instructions  Medication Instructions:  Your physician has recommended you make the following change in your medication:   Stop Taking Spironolactone  Start Taking Metolazone 2.5 mg for 2 days and then Take three times Weekly   *If you need a refill on your cardiac medications before your next appointment, please call your pharmacy*   Lab Work: Your physician recommends that you return for lab work in: 2 Weeks ( 11/04/20)   If you have labs (blood work) drawn today and your tests are completely normal, you will receive your results only by: Marland Kitchen MyChart Message (if you have MyChart) OR . A paper copy in the mail If you have any lab test that is abnormal or we need to change your treatment, we will call you to review the results.   Testing/Procedures: NONE   Follow-Up: At [facility], you and your health needs are our priority.  As part of our continuing mission to provide you with exceptional heart care, we have created designated Provider Care Teams.  These Care Teams include your primary Cardiologist (physician) and Advanced Practice Providers (APPs -  Physician Assistants and Nurse Practitioners) who all work together to provide you with the care you need, when you need it.  We recommend signing up for the patient portal called "MyChart".  Sign up information is provided on this After Visit Summary.  MyChart is used to connect  with patients for Virtual Visits (Telemedicine).  Patients are able to view lab/test results, encounter notes, upcoming appointments, etc.  Non-urgent messages can be sent to your provider as well.   To learn more about what you can do with MyChart, go to [email or web address].    Your next appointment:    As Planned   The format for your next appointment:   In Person  Provider:   Lauraine Rinne, PA-C    Other Instructions Thank you for choosing [facility]!       Signed, Erma Heritage, PA-C  10/21/2020 8:13 PM    [facility] [address] [place], [place] [address] Phone: [phone] Fax: [phone]

## 2020-10-21 ENCOUNTER — Encounter: Payer: Self-pay | Admitting: Student

## 2020-10-21 ENCOUNTER — Ambulatory Visit (INDEPENDENT_AMBULATORY_CARE_PROVIDER_SITE_OTHER): Payer: 59 | Admitting: Student

## 2020-10-21 ENCOUNTER — Other Ambulatory Visit: Payer: Self-pay

## 2020-10-21 VITALS — BP 138/86 | HR 98 | Ht 63.0 in | Wt 336.0 lb

## 2020-10-21 DIAGNOSIS — Z79899 Other long term (current) drug therapy: Secondary | ICD-10-CM

## 2020-10-21 DIAGNOSIS — I5031 Acute diastolic (congestive) heart failure: Secondary | ICD-10-CM | POA: Diagnosis not present

## 2020-10-21 DIAGNOSIS — Z0189 Encounter for other specified special examinations: Secondary | ICD-10-CM

## 2020-10-21 DIAGNOSIS — N289 Disorder of kidney and ureter, unspecified: Secondary | ICD-10-CM | POA: Diagnosis not present

## 2020-10-21 DIAGNOSIS — R0600 Dyspnea, unspecified: Secondary | ICD-10-CM | POA: Diagnosis not present

## 2020-10-21 DIAGNOSIS — R0609 Other forms of dyspnea: Secondary | ICD-10-CM

## 2020-10-21 MED ORDER — METOLAZONE 2.5 MG PO TABS: 2.5000 mg | ORAL_TABLET | ORAL | 11 refills | Status: DC

## 2020-10-21 NOTE — Patient Instructions (Signed)
Medication Instructions:  Your physician has recommended you make the following change in your medication:   Stop Taking Spironolactone  Start Taking Metolazone 2.5 mg for 2 days and then Take three times Weekly   *If you need a refill on your cardiac medications before your next appointment, please call your pharmacy*   Lab Work: Your physician recommends that you return for lab work in: 2 Weeks ( 11/04/20)   If you have labs (blood work) drawn today and your tests are completely normal, you will receive your results only by: Marland Kitchen MyChart Message (if you have MyChart) OR . A paper copy in the mail If you have any lab test that is abnormal or we need to change your treatment, we will call you to review the results.   Testing/Procedures: NONE   Follow-Up: At [facility], you and your health needs are our priority.  As part of our continuing mission to provide you with exceptional heart care, we have created designated Provider Care Teams.  These Care Teams include your primary Cardiologist (physician) and Advanced Practice Providers (APPs -  Physician Assistants and Nurse Practitioners) who all work together to provide you with the care you need, when you need it.  We recommend signing up for the patient portal called "MyChart".  Sign up information is provided on this After Visit Summary.  MyChart is used to connect with patients for Virtual Visits (Telemedicine).  Patients are able to view lab/test results, encounter notes, upcoming appointments, etc.  Non-urgent messages can be sent to your provider as well.   To learn more about what you can do with MyChart, go to [email or web address].    Your next appointment:    As Planned   The format for your next appointment:   In Person  Provider:   Lauraine Rinne, PA-C    Other Instructions Thank you for choosing [facility]!

## 2020-10-29 ENCOUNTER — Ambulatory Visit: Payer: Managed Care, Other (non HMO) | Admitting: Gastroenterology

## 2020-11-04 ENCOUNTER — Telehealth: Payer: Self-pay

## 2020-11-04 ENCOUNTER — Other Ambulatory Visit (HOSPITAL_COMMUNITY)
Admission: RE | Admit: 2020-11-04 | Discharge: 2020-11-04 | Disposition: A | Discharge status: Home / Self Care | Payer: Medicaid Other | Source: Ambulatory Visit | Attending: Student | Admitting: Student

## 2020-11-04 DIAGNOSIS — Z79899 Other long term (current) drug therapy: Secondary | ICD-10-CM | POA: Diagnosis present

## 2020-11-04 LAB — BASIC METABOLIC PANEL
Anion gap: 15 (ref 5–15)
BUN: 40 mg/dL — ABNORMAL HIGH (ref 6–20)
CO2: 31 mmol/L (ref 22–32)
Calcium: 9.2 mg/dL (ref 8.9–10.3)
Chloride: 88 mmol/L — ABNORMAL LOW (ref 98–111)
Creatinine, Ser: 2.03 mg/dL — ABNORMAL HIGH (ref 0.44–1.00)
GFR, Estimated: 30 mL/min — ABNORMAL LOW (ref >60)
Glucose, Bld: 111 mg/dL — ABNORMAL HIGH (ref 70–99)
Potassium: 2.7 mmol/L — CL (ref 3.5–5.1)
Sodium: 134 mmol/L — ABNORMAL LOW (ref 135–145)

## 2020-11-04 NOTE — Telephone Encounter (Signed)
Lab called with critical potassium of 2.7  DOD Dr.Branch notified, orders patient to take additional 40 meq potassium tonight (pt takes potassium 40 meq tid)      Patient confirmed dose and will take an extra 40 meq tonight

## 2020-11-05 ENCOUNTER — Other Ambulatory Visit: Payer: Self-pay

## 2020-11-05 ENCOUNTER — Ambulatory Visit (INDEPENDENT_AMBULATORY_CARE_PROVIDER_SITE_OTHER): Payer: 59 | Admitting: Physician Assistant

## 2020-11-05 ENCOUNTER — Encounter: Payer: Self-pay | Admitting: Physician Assistant

## 2020-11-05 VITALS — BP 128/88 | HR 94 | Ht 63.0 in | Wt 310.0 lb

## 2020-11-05 DIAGNOSIS — I10 Essential (primary) hypertension: Secondary | ICD-10-CM | POA: Diagnosis not present

## 2020-11-05 DIAGNOSIS — I5031 Acute diastolic (congestive) heart failure: Secondary | ICD-10-CM | POA: Diagnosis not present

## 2020-11-05 DIAGNOSIS — Z79899 Other long term (current) drug therapy: Secondary | ICD-10-CM | POA: Diagnosis not present

## 2020-11-05 MED ORDER — METOLAZONE 2.5 MG PO TABS
ORAL_TABLET | ORAL | 6 refills | Status: DC
Start: 2020-11-05 — End: 2021-02-18

## 2020-11-05 NOTE — Patient Instructions (Addendum)
Medication Instructions:  Your physician has recommended you make the following change in your medication:   Take Metolazone 2.5mg  weekly as needed for weight gain   *If you need a refill on your cardiac medications before your next appointment, please call your pharmacy*   Lab Work: Your physician recommends that you return for lab work BMET   If you have labs (blood work) drawn today and your tests are completely normal, you will receive your results only by: Marland Kitchen MyChart Message (if you have MyChart) OR . A paper copy in the mail If you have any lab test that is abnormal or we need to change your treatment, we will call you to review the results.   Testing/Procedures: NONE   Follow-Up: At [facility], you and your health needs are our priority.  As part of our continuing mission to provide you with exceptional heart care, we have created designated Provider Care Teams.  These Care Teams include your primary Cardiologist (physician) and Advanced Practice Providers (APPs -  Physician Assistants and Nurse Practitioners) who all work together to provide you with the care you need, when you need it.  We recommend signing up for the patient portal called "MyChart".  Sign up information is provided on this After Visit Summary.  MyChart is used to connect with patients for Virtual Visits (Telemedicine).  Patients are able to view lab/test results, encounter notes, upcoming appointments, etc.  Non-urgent messages can be sent to your provider as well.   To learn more about what you can do with MyChart, go to [email or web address].    Your next appointment:   1 month(s)  The format for your next appointment:   In Person  Provider:   Rozann Lesches, MD or Bernerd Pho, PA-C   Other Instructions   WEIGH DAILY, every am, wearing the same amount of clothing Record weights, and bring it to office appointments Contact Rozann Lesches, MD for weight gain of 3 lbs in a day or 5 lbs  in a week Limit sodium to 500 mg per meal, total 2000 mg per day Limit all liquids to 1.5-2 liters/quarts per day  Thank you for choosing [facility]!

## 2020-11-05 NOTE — Progress Notes (Signed)
Cardiology Office Note   Date:  11/05/2020   ID:  Lauren Osborn, DOB [birth year], MRN [id number]  PCP:  Lauren Fire, MD Cardiologist:  Lauren Lesches, MD 09/22/2020 in-hospital Lauren Osborn, [facility] 10/21/2020 Electrphysiologist: None Lauren Ferries, PA-C   No chief complaint on file.   History of Present Illness: Lauren Osborn is a 45 y.o. female with a history of D-CHF, HTN, GERD, thyroid nodule s/p partial thyroidectomy.  Recently pt wt increasing (286 lbs 08/2020>> 351 lbs 09/09/2020).  Admitted 01/12-01/19/2022 for CHF, wt 358 >> 329 lbs. After d/c on torsemide 60 mg bid >> 40 mg bid since Cr 1.80 on 01/24 Wt increase >> torsemide 60 mg am, 40 mg pm 10/21/2020 office visit, wt 336 lbs, spiro stopped, add metolazone 2.5 mg 3 x week, no change in torsemide dose. 03/03 labs w/ Cr 2.03,  K+ 2.7, base dose Kdur 40 meq tid, extra 40 meq x 1 Probable OSA, has Pulm appt 11/16/2020.  Lauren Osborn presents for cardiology follow up.  Her weight has trended down. She is not drinking Gatorade. Her weight is down 20 lbs from hospital d/c.  She is being much more conscious of the sodium in the foods she eats. She drinks 3 - 20 oz bottles of water daily plus a little more. Struggles with dry mouth.   Her weight has been bouncing between 305-307 lbs.   She is still waking with LE edema. She wears TED hose, but the new ones itch. She has tried lotions, they help a little. She will be able to take them off and put her feet up on the weekend. She still has some LE edema on waking, but it gets rapidly worse if not wearing the TED hose.  No chest pain. She is working as a Presenter, broadcasting.   She still has some PND vs OSA. Not sure about orthopnea, sleeps flat but on her side.   She hears herself wheeze at times, but has upper airway wheeze in the office today.   COVID status: vaccinated (aware that if she gets Covid, she may not survive), ?had COVID (mom and family were ill, mom+, others  were sick, some did not get tested or were negative, some were positive. Past Medical History:  Diagnosis Date  . Diastolic congestive heart failure ([place])   . Essential hypertension   . Fibroids   . Gout   . Morbid obesity ([place])   . Thyroid nodule     Past Surgical History:  Procedure Laterality Date  . BIOPSY  08/03/2020   Procedure: BIOPSY;  Surgeon: Lauren Harman, DO;  [facility];  Service: Endoscopy;;  duodenal gastric  . CESAREAN SECTION    . COLONOSCOPY WITH PROPOFOL N/A 08/03/2020   Procedure: COLONOSCOPY WITH PROPOFOL;  Surgeon: Lauren Harman, DO;  [facility];  Service: Endoscopy;  Laterality: N/A;  1:15pm-rescheduled to 11/30 @ 8:00am  . ESOPHAGOGASTRODUODENOSCOPY (EGD) WITH PROPOFOL N/A 08/03/2020   Procedure: ESOPHAGOGASTRODUODENOSCOPY (EGD) WITH PROPOFOL;  Surgeon: Lauren Harman, DO;  [facility];  Service: Endoscopy;  Laterality: N/A;  . HYSTERECTOMY ABDOMINAL WITH SALPINGECTOMY  2008  . POLYPECTOMY  08/03/2020   Procedure: POLYPECTOMY;  Surgeon: Lauren Harman, DO;  [facility];  Service: Endoscopy;;  colon  . THYROIDECTOMY Right 10/21/2018   Procedure: RIGHT HEMITHYROIDECTOMY;  Surgeon: Lauren Baptist, MD;  Location: [facility];  Service: ENT;  Laterality: Right;    Current Outpatient Medications  Medication Sig Dispense Refill  . acetaminophen (TYLENOL) 500 MG tablet Take 1,000 mg by mouth every 6 (six) hours as needed for moderate pain or headache.    . FEROSUL 325 (65 Fe) MG tablet Take 325 mg by mouth 2 (two) times daily.    . Magnesium Oxide 400 MG CAPS Take 1 capsule (400 mg total) by mouth daily. 30 capsule 2  . metolazone (ZAROXOLYN) 2.5 MG tablet Take 1 Tablet weekly as needed for weight gain 5 tablet 6  . omeprazole (PRILOSEC) 20 MG capsule Take 1 capsule (20 mg total) by mouth 2 (two) times daily. 60 capsule 5  . potassium chloride SA (KLOR-CON) 20 MEQ tablet Take 2 tablets (40 mEq  total) by mouth 3 (three) times daily. 180 tablet 2  . torsemide (DEMADEX) 20 MG tablet Take 60 mg (3 tablets) in the am and 40 mg ( 2 tablets) in the pm 150 tablet 3  . magnesium oxide (MAG-OX) 400 MG tablet Take 1 tablet by mouth daily.     No current facility-administered medications for this visit.    Allergies:   Oxycodone-acetaminophen    Social History:  The patient  reports that she quit smoking about 5 years ago. Her smoking use included cigarettes. She has a 10.00 pack-year smoking history. She has never used smokeless tobacco. She reports previous alcohol use. She reports that she does not use drugs.   Family History:  The patient's family history includes Breast cancer in her mother; Colon cancer (age of onset: 92) in her father; Colon polyps (age of onset: 16) in her sister; Congestive Heart Failure in her maternal grandfather; Diabetes in her daughter; Diverticulitis in her brother and sister; Other in her maternal grandmother and paternal grandfather.  She indicated that her mother is alive. She indicated that her father is deceased. She indicated that both of her sisters are alive. She indicated that her brother is alive. She indicated that her maternal grandmother is deceased. She indicated that her maternal grandfather is deceased. She indicated that her paternal grandmother is deceased. She indicated that her paternal grandfather is deceased. She indicated that her daughter is alive.    ROS:  Please see the history of present illness. All other systems are reviewed and negative.    PHYSICAL EXAM: VS:  BP 128/88   Pulse 94   Ht 5\' 3"  (1.6 m)   Wt (!) 310 lb (140.6 kg)   SpO2 97%   BMI 54.91 kg/m  , BMI Body mass index is 54.91 kg/m. GEN: Well nourished, well developed, female in no acute distress HEENT: normal for age  Neck: no JVD, no carotid bruit, no masses Cardiac: RRR; no murmur, no rubs, or gallops Respiratory:  clear to auscultation bilaterally, normal work  of breathing GI: soft, nontender, nondistended, + BS MS: no deformity or atrophy; no edema; distal pulses are 2+ in all 4 extremities  Skin: warm and dry, no rash Neuro:  Strength and sensation are intact Psych: euthymic mood, full affect   EKG:  EKG is not ordered today.  ECHO: 09/16/2020 1. Left ventricular ejection fraction, by estimation, is 65 to 70%. The  left ventricle has normal function. The left ventricle has no regional  wall motion abnormalities. Left ventricular diastolic parameters were  normal.  2. RV not well visualized. Grossly appears enlarged. Function appears to  be decreased but difficult to quantify. Intermittent septal bounce likely  secondary to RV strain, as there is no echocardiographic signs of  constrictive  pericarditis (normal mitral inflow, normal MV tissue Doppler). . Right ventricular systolic function was not well visualized. The right ventricular size is not well  visualized.  3. The mitral valve is normal in structure. No evidence of mitral valve  regurgitation. No evidence of mitral stenosis.  4. The aortic valve was not well visualized. Aortic valve regurgitation  is not visualized. No aortic stenosis is present.    Recent Labs: 09/15/2020: B Natriuretic Peptide 318.0 09/16/2020: ALT 14 09/17/2020: TSH 4.037 09/22/2020: Hemoglobin 12.1; Magnesium 1.8; Platelets 230 11/04/2020: BUN 40; Creatinine, Ser 2.03; Potassium 2.7; Sodium 134  CBC    Component Value Date/Time   WBC 6.7 09/22/2020 0344   RBC 4.15 09/22/2020 0344   HGB 12.1 09/22/2020 0344   HCT 39.3 09/22/2020 0344   PLT 230 09/22/2020 0344   MCV 94.7 09/22/2020 0344   MCH 29.2 09/22/2020 0344   MCHC 30.8 09/22/2020 0344   RDW 16.7 (H) 09/22/2020 0344   LYMPHSABS 1.3 08/02/2020 0951   MONOABS 0.5 08/02/2020 0951   EOSABS 0.1 08/02/2020 0951   BASOSABS 0.1 08/02/2020 0951   CMP Latest Ref Rng & Units 11/04/2020 09/27/2020 09/22/2020  Glucose 70 - 99 mg/dL 111(H) 102(H) 88  BUN 6 -  20 mg/dL 40(H) 21(H) 21(H)  Creatinine 0.44 - 1.00 mg/dL 2.03(H) 1.80(H) 1.55(H)  Sodium 135 - 145 mmol/L 134(L) 137 140  Potassium 3.5 - 5.1 mmol/L 2.7(LL) 3.2(L) 2.9(L)  Chloride 98 - 111 mmol/L 88(L) 91(L) 94(L)  CO2 22 - 32 mmol/L 31 31 32  Calcium 8.9 - 10.3 mg/dL 9.2 9.2 9.1  Total Protein 6.5 - 8.1 g/dL - - -  Total Bilirubin 0.3 - 1.2 mg/dL - - -  Alkaline Phos 38 - 126 U/L - - -  AST 15 - 41 U/L - - -  ALT 0 - 44 U/L - - -   Lab Results  Component Value Date   TSH 4.037 09/17/2020   Lipid Panel No results found for: CHOL, HDL, LDLCALC, LDLDIRECT, TRIG, CHOLHDL    Wt Readings from Last 3 Encounters:  11/05/20 (!) 310 lb (140.6 kg)  10/21/20 (!) 336 lb (152.4 kg)  09/24/20 (!) 329 lb (149.2 kg)     Other studies Reviewed: Additional studies/ records that were reviewed today include: Office notes, hospital records and testing.  ASSESSMENT AND PLAN:  1.  Acute on chronic diastolic CHF - not at dry wt yet, but doing much better - encouraged her to lose a pound or 2 a week - keep metolazone as prn med, hopefully will not have to use it much, but it does work well for her - continue torsemide at current dose - recheck BMET & Mg at 03/15 office visit w/ Dr Elsworth Soho - K+ should come up off the metolazone, no sx from the low K+ - explained that lifestyle very important, to reduce diuretic use and stress on her kidneys - very salt-sensitive, explained she really has to watch it. - f/u in 1 month - diastolic parameters were normal on echo, RV possibly enlarged w/ decreased function - wt gain of 60 lbs in a month is concerning, unclear cause - RV dysfunction could be related to OHS, OSA, recheck once volume normalized  2. ?OSA - by description, has this (daytime somnolence, snoring, waking in the night) - she will keep appt w/ Dr Elsworth Soho  3. HTN - BP well-controlled on current rx - continue torsemide   Current medicines are reviewed at length with the patient today.  The  patient does not have concerns regarding medicines.  The following changes have been made:  Change metolazone to prn only  Labs/ tests ordered today include:   Orders Placed This Encounter  Procedures  . Basic metabolic panel     Disposition:   FU with Lauren Lesches, MD  Signed, Lauren Ferries, PA-C  11/05/2020 4:24 PM    [place] Phone: [phone]; Fax: [phone]

## 2020-11-16 ENCOUNTER — Encounter: Payer: Self-pay | Admitting: Pulmonary Disease

## 2020-11-16 ENCOUNTER — Other Ambulatory Visit: Payer: Self-pay

## 2020-11-16 ENCOUNTER — Ambulatory Visit (INDEPENDENT_AMBULATORY_CARE_PROVIDER_SITE_OTHER): Payer: 59 | Admitting: Pulmonary Disease

## 2020-11-16 ENCOUNTER — Telehealth: Payer: Self-pay | Admitting: Student

## 2020-11-16 DIAGNOSIS — G4733 Obstructive sleep apnea (adult) (pediatric): Secondary | ICD-10-CM | POA: Diagnosis not present

## 2020-11-16 NOTE — Addendum Note (Signed)
Addended by: Merrilee Seashore on: 11/16/2020 12:16 PM   Modules accepted: Orders

## 2020-11-16 NOTE — Telephone Encounter (Signed)
Contacted patient with no answer. Left a message to call office.

## 2020-11-16 NOTE — Patient Instructions (Signed)
Home sleep test Based on this, we may need a CPAP machine

## 2020-11-16 NOTE — Telephone Encounter (Signed)
Patient's weight: 3/12- 301 lb 3/13- 303 lb 3/14- 304 lb 3/15- 305 lb  Patient is experiencing weight gain of 1 pound per day. Patient admitted that she is also experiencing SOB, but no CP, tightness, or dizziness. Patient has admitted that she is taking her Demadex 60 mg in the am and 40 mg in the pm, however patient said that she is not taking the Metolazone 2.5 mg tablets prn, she takes one (1) tablet by mouth once a week for fear of extensive kidney damage. Patient said she has worked on sodium intake, per R. Barrett PA-C's last office note, patient is "salt-sensitive", and patient is trying to limit her fluid intake to 1.5-2 liters/quarts per day.   Please advise.

## 2020-11-16 NOTE — Telephone Encounter (Signed)
Patient contacted and is aware that she may take Metolazone 2.5 mg tablet for extra fluid. She is also aware that she needs lab work soon for reevaluation of kidney function and potassium level. Patient has agreed to go 11/17/20 in the afternoon.

## 2020-11-16 NOTE — Progress Notes (Signed)
Subjective:    Patient ID: Lauren Osborn, female    DOB: [birth year], 45 y.o.   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 PMH -chronic diastolic heart failure   Admitted 01/12-01/19/2022 for CHF, wt 358 >> 329 lbs. 3/3 cr increased from 1.5 to 2.0 , dropped metolazone to prn.  Her weight has now increased back up to 308 pounds. She reports increased somnolence and wakes up gasping for air several times a week.  Loud snoring has been noted by family members. Epworth sleepiness score is 12 and she reports sleepiness while sitting and reading or watching TV or lying down to rest in the afternoons.  She is busy at work and denies sleep pressure. Bedtime is between 1030 and 11 PM, sleep latency is minimal, she sleeps on her right side with 1 pillow, reports 3-4 nocturnal awakenings and is out of bed at 6:45 AM with dryness of mouth and denies headaches. There is no history suggestive of cataplexy, sleep paralysis or parasomnias  She takes her torsemide at 8 AM and 5 PM and reports nocturia She smoked about a pack every 3 days until she quit in 2017, less than 10 pack years  Mother and brother have OSA, dilated colon cancer murmur breast cancer   Past Medical History:  Diagnosis Date  . Diastolic congestive heart failure ([place])   . Essential hypertension   . Fibroids   . Gout   . Morbid obesity ([place])   . Thyroid nodule      Past Surgical History:  Procedure Laterality Date  . BIOPSY  08/03/2020   Procedure: BIOPSY;  Surgeon: Eloise Harman, DO;  [facility];  Service: Endoscopy;;  duodenal gastric  . CESAREAN SECTION    . COLONOSCOPY WITH PROPOFOL N/A 08/03/2020   Procedure: COLONOSCOPY WITH PROPOFOL;  Surgeon: Eloise Harman, DO;  [facility];  Service: Endoscopy;  Laterality: N/A;  1:15pm-rescheduled to 11/30 @ 8:00am  . ESOPHAGOGASTRODUODENOSCOPY (EGD) WITH PROPOFOL N/A  08/03/2020   Procedure: ESOPHAGOGASTRODUODENOSCOPY (EGD) WITH PROPOFOL;  Surgeon: Eloise Harman, DO;  [facility];  Service: Endoscopy;  Laterality: N/A;  . HYSTERECTOMY ABDOMINAL WITH SALPINGECTOMY  2008  . POLYPECTOMY  08/03/2020   Procedure: POLYPECTOMY;  Surgeon: Eloise Harman, DO;  [facility];  Service: Endoscopy;;  colon  . THYROIDECTOMY Right 10/21/2018   Procedure: RIGHT HEMITHYROIDECTOMY;  Surgeon: Leta Baptist, MD;  Location: [facility];  Service: ENT;  Laterality: Right;    Allergies  Allergen Reactions  . Oxycodone-Acetaminophen Hives and Other (See Comments)    Social History   Socioeconomic History  . Marital status: Single    Spouse name: Not on file  . Number of children: Not on file  . Years of education: Not on file  . Highest education level: Not on file  Occupational History  . Not on file  Tobacco Use  . Smoking status: Former Smoker    Packs/day: 0.50    Years: 20.00    Pack years: 10.00    Types: Cigarettes    Quit date: 2017    Years since quitting: 5.2  . Smokeless tobacco: Never Used  Vaping Use  . Vaping Use: Never used  Substance and Sexual Activity  . Alcohol use: Not Currently  . Drug use: Never  . Sexual activity: Yes    Comment: hyst  Other Topics Concern  .  Not on file  Social History Narrative  . Not on file   Social Determinants of Health   Financial Resource Strain: Not on file  Food Insecurity: Not on file  Transportation Needs: Not on file  Physical Activity: Not on file  Stress: Not on file  Social Connections: Not on file  Intimate Partner Violence: Not on file     Family History  Problem Relation Age of Onset  . Breast cancer Mother   . Other Paternal Grandfather        house fire  . Other Maternal Grandmother        house fire  . Congestive Heart Failure Maternal Grandfather   . Colon cancer Father 21  . Diverticulitis Brother   . Diverticulitis Sister   . Colon  polyps Sister 36  . Diabetes Daughter        borderline     Review of Systems   Constitutional: negative for anorexia, fevers and sweats  Eyes: negative for irritation, redness and visual disturbance  Ears, nose, mouth, throat, and face: negative for earaches, epistaxis, nasal congestion and sore throat  Respiratory: negative for cough,  sputum and wheezing  Cardiovascular: negative for chest pain,  orthopnea, palpitations and syncope  Gastrointestinal: negative for abdominal pain, constipation, diarrhea, melena, nausea and vomiting  Genitourinary:negative for dysuria, frequency and hematuria  Hematologic/lymphatic: negative for bleeding, easy bruising and lymphadenopathy  Musculoskeletal:negative for arthralgias, muscle weakness and stiff joints  Neurological: negative for coordination problems, gait problems, headaches and weakness  Endocrine: negative for diabetic symptoms including polydipsia, polyuria and weight loss     Objective:   Physical Exam  Gen. Pleasant, obese, in no distress ENT - no lesions, no post nasal drip Neck: No JVD, no thyromegaly, no carotid bruits Lungs: no use of accessory muscles, no dullness to percussion, decreased without rales or rhonchi  Cardiovascular: Rhythm regular, heart sounds  normal, no murmurs or gallops, no peripheral edema Musculoskeletal: No deformities, no cyanosis or clubbing , no tremors       Assessment & Plan:

## 2020-11-16 NOTE — Telephone Encounter (Signed)
    Given her steady weight gain, I would recommend she go ahead and take a Metolazone tablet as retaining fluid is not good for the kidneys either. I am glad to hear she is limiting her sodium and fluid intake. Would continue to be mindful of this. Please make sure she has repeat labs soon (appears BMET and Mg have already been ordered) so we can reassess her kidney function and potassium level.   Signed, Erma Heritage, PA-C 11/16/2020, 11:16 AM Pager: [phone]

## 2020-11-16 NOTE — Assessment & Plan Note (Signed)
Given excessive daytime somnolence, narrow pharyngeal exam, witnessed apneas & loud snoring, obstructive sleep apnea is very likely & an overnight polysomnogram will be scheduled as a home study. The pathophysiology of obstructive sleep apnea , it's cardiovascular consequences & modes of treatment including CPAP were discused with the patient in detail & they evidenced understanding.  Pre test probability is high & she would be willing to use CPAP if needed. Likely mouth breather & would need a full face mask

## 2020-11-16 NOTE — Telephone Encounter (Signed)
New message     Pt c/o swelling: STAT is pt has developed SOB within 24 hours  1) How much weight have you gained and in what time 5lbs in a week   2) If swelling, where is the swelling located? Bloated but no noticeable swelling anywhere   3) Are you currently taking a fluid pill? Yes   4) Are you currently SOB? Yes   5) Do you have a log of your daily weights (if so, list)? Today 305 yesterday 304 Sunday 303 Saturday 301 Friday 300 Thursday 303   6) Have you gained 3 pounds in a day or 5 pounds in a week? 300 lb last week and 305 today  7) Have you traveled recently? No

## 2020-11-30 NOTE — Progress Notes (Signed)
Referring Provider: Dara Lords, NP Primary Care Physician:  [name], MD Primary GI Physician: Dr. Abbey Chatters  Chief Complaint  Patient presents with  . Follow-up    Denies abd pain, no nausea, no vomiting    HPI:   Lauren Osborn is a 45 y.o. female presenting today for follow-up of normocytic anemia s/p EGD and colonoscopy.   Last seen in our office 04/23/2020 for family history of colon cancer and new onset normocytic anemia which began in June 2021.  Previously, hemoglobin 14.8, down to 9.9 with iron low at 21, ferritin elevated at 304, saturation low at 8%.  Denied overt GI bleeding.  Iron twice daily causing nausea.  No NSAIDs.  Chronic GERD on Protonix which controls symptoms.  She was scheduled for EGD and colonoscopy.  Procedures 08/03/2020: Colonoscopy: 5 mm polyp in the sigmoid colon, nonbleeding internal hemorrhoids, fair prep.  Recommended repeat colonoscopy in 5 years.  Pathology with tubular adenoma. EGD: LA grade C esophagitis without bleeding, gastritis characterized by erythema and shallow ulcerations in gastric antrum biopsied, normal examined duodenum biopsied.  Gastric biopsy negative for H. pylori, duodenal biopsy benign.  Recommended PPI twice daily.  Consider capsule study if anemia does not improve with iron supplementation and treatment of gastritis/ulcers.  Hospitalized in January 2022 with anasarca, acute pulmonary edema in the setting of acute on chronic diastolic CHF.  Most recent hemoglobin 12.1 in September 22, 2020.  Today:  Today she states she is doing well.  Currently taking omeprazole 20 mg twice daily.  No breakthrough acid reflux symptoms.  Denies abdominal pain, nausea, vomiting, dysphagia, BRBPR, or melena.  Notes her stools are dark green in the setting of iron which she is taking twice daily.  No vaginal bleeding, nosebleeds, hematuria, or any other obvious blood loss.  Bowels are moving well without constipation or diarrhea.  Denies  NSAIDs.  Regarding anasarca, patient states she had pitting edema all the way up to her chest/shoulders when she was admitted.  Swelling started after she had run out of her diuretics for about 1 month.  Reports she had gained about 60+ pounds in fluid.  Feels she is almost back to her baseline,. Still with some fluid in her legs and lower abdomen.  She is 294 pounds today.  In December, prior to admission, she was 286 pounds.  Cardiology is managing diuretics and monitoring her closely due to worsening kidney function and hypokalemia.  Past Medical History:  Diagnosis Date  . Diastolic congestive heart failure ([place])   . Essential hypertension   . Fibroids   . GERD (gastroesophageal reflux disease)   . Gout   . Morbid obesity ([place])   . Thyroid nodule     Past Surgical History:  Procedure Laterality Date  . BIOPSY  08/03/2020   Procedure: BIOPSY;  Surgeon: Eloise Harman, DO;  [facility]: [facility];  Service: Endoscopy;;  duodenal gastric  . CESAREAN SECTION    . COLONOSCOPY WITH PROPOFOL N/A 08/03/2020    Surgeon: Hurshel Keys K, DO; 5 mm tubular adenoma in the sigmoid colon, nonbleeding internal hemorrhoids.  Recommended repeat in 5 years.  . ESOPHAGOGASTRODUODENOSCOPY (EGD) WITH PROPOFOL N/A 08/03/2020   Surgeon: Eloise Harman, DO; LA grade C esophagitis without bleeding, gastritis with erythema and shallow ulcerations in gastric antrum biopsied (negative for H. pylori), normal examined duodenum s/p biopsy (benign).  . HYSTERECTOMY ABDOMINAL WITH SALPINGECTOMY  2008  . POLYPECTOMY  08/03/2020   Procedure: POLYPECTOMY;  Surgeon: Eloise Harman, DO;  Location: [facility];  Service: Endoscopy;;  colon  . THYROIDECTOMY Right 10/21/2018   Procedure: RIGHT HEMITHYROIDECTOMY;  Surgeon: Leta Baptist, MD;  Location: [place];  Service: ENT;  Laterality: Right;    Current Outpatient Medications  Medication Sig Dispense Refill  . acetaminophen (TYLENOL) 500 MG  tablet Take 1,000 mg by mouth every 6 (six) hours as needed for moderate pain or headache.    . FEROSUL 325 (65 Fe) MG tablet Take 325 mg by mouth 2 (two) times daily.    . Magnesium Oxide 400 MG CAPS Take 1 capsule (400 mg total) by mouth daily. 30 capsule 2  . metolazone (ZAROXOLYN) 2.5 MG tablet Take 1 Tablet weekly as needed for weight gain 5 tablet 6  . omeprazole (PRILOSEC) 20 MG capsule Take 1 capsule (20 mg total) by mouth 2 (two) times daily. 60 capsule 5  . potassium chloride SA (KLOR-CON) 20 MEQ tablet Take 2 tablets (40 mEq total) by mouth 3 (three) times daily. 180 tablet 2  . torsemide (DEMADEX) 20 MG tablet Take 60 mg (3 tablets) in the am and 40 mg ( 2 tablets) in the pm 150 tablet 3   No current facility-administered medications for this visit.    Allergies as of 12/01/2020 - Review Complete 12/01/2020  Allergen Reaction Noted  . Oxycodone-acetaminophen Hives and Other (See Comments) 10/11/2018    Family History  Problem Relation Age of Onset  . Breast cancer Mother   . Other Paternal Grandfather        house fire  . Other Maternal Grandmother        house fire  . Congestive Heart Failure Maternal Grandfather   . Colon cancer Father 50  . Diverticulitis Brother   . Diverticulitis Sister   . Colon polyps Sister 76  . Diabetes Daughter        borderline    Social History   Socioeconomic History  . Marital status: Single    Spouse name: Not on file  . Number of children: Not on file  . Years of education: Not on file  . Highest education level: Not on file  Occupational History  . Not on file  Tobacco Use  . Smoking status: Former Smoker    Packs/day: 0.50    Years: 20.00    Pack years: 10.00    Types: Cigarettes    Quit date: 2017    Years since quitting: 5.2  . Smokeless tobacco: Never Used  Vaping Use  . Vaping Use: Never used  Substance and Sexual Activity  . Alcohol use: Not Currently  . Drug use: Never  . Sexual activity: Yes    Comment:  hyst  Other Topics Concern  . Not on file  Social History Narrative  . Not on file   Social Determinants of Health   Financial Resource Strain: Not on file  Food Insecurity: Not on file  Transportation Needs: Not on file  Physical Activity: Not on file  Stress: Not on file  Social Connections: Not on file    Review of Systems: Gen: Denies fever, chills, cold or flulike symptoms, lightheadedness, dizziness, presyncope, syncope. CV: Denies chest pain or palpitations. Resp: Denies dyspnea or cough. GI: See HPI Heme: See HPI  Physical Exam: BP 115/88   Pulse 88   Temp (!) 97 F (36.1 C)   Ht $R'5\' 3"'vj$  (1.6 m)   Wt 294 lb 3.2 oz (133.4 kg)   BMI  52.12 kg/m  General:   Alert and oriented. No distress noted. Pleasant and cooperative.  Head:  Normocephalic and atraumatic. Eyes:  Conjuctiva clear without scleral icterus. Heart:  S1, S2 present without murmurs appreciated. Lungs:  Clear to auscultation bilaterally. No wheezes, rales, or rhonchi. No distress.  Abdomen:  +BS, obese abdomen but fairly soft. She does have anasarca with edema in her mid and lower abdomen. No erythema or warmth of the skin. No rebound or guarding. No HSM or masses noted. Msk:  Symmetrical without gross deformities. Normal posture. Extremities:  With 1-2+ bilateral pitting edema into thighs.  Neurologic:  Alert and  oriented x4 Psych: Normal mood and affect.   Assessment: 45 year old female who was found to have normocytic anemia in June 2021 with iron low at 21, saturation low at 8%, ferritin elevated at 304, now presenting for follow-up s/p EGD and colonoscopy in November 2021.  Colonoscopy revealed 5 mm tubular adenoma with recommendations to repeat in 5 years.  EGD with LA grade C esophagitis without bleeding, gastritis with erythema and shallow ulcerations in the gastric antrum (biopsies negative for H. pylori), normal examined duodenum (biopsies benign).  She has maintained on omeprazole 20 mg twice  daily. GERD is well controlled. Denies overt GI bleeding.  Also taking iron twice daily. No NSAIDs. Most recent hemoglobin in January 2022 was 12.1. Of note, she is now dealing with kidney dysfunction in the setting of diuretics due to diastolic heart failure with anasarca (improving), following closely with cardiology.   Elevated Bilirubin and Liver Lesion: Chronic history of mildly elevated bilirubin dating back at least to 2008.  Most recent total bilirubin 2.4.  AST, ALT, alk phos within normal limits.  She was hospitalized at that time with anasarca, acute pulmonary edema in the setting of acute on chronic diastolic CHF.  Now diuresing with metolazone and Demadex.  Recent RUQ ultrasound January 2022 with likely 5.3 cm gallstone versus multiple tiny calculi, mildly thickened gallbladder wall, no pericholecystic fluid, CBD normal, normal liver parenchyma, 3.4 x 3.8 x 2.7 cm hyperechoic right liver mass which could represent hepatic hemangioma, but ultrasound is not diagnostic.  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  As she is on high-dose diuretics, she may be intravascularly depleted and this can cause increased bilirubin in the setting of Gilbert's syndrome.  I will order HFP to reassess total bilirubin and fractionate bilirubin.  We will also arrange MRI with and without contrast to further evaluate liver lesion.  Plan: 1.  Update CBC and iron panel.  Will discontinue iron and monitor if labs are within normal limits.  2.  HFP to reassess and fractionate bilirubin.  3.  MRI abdomen with and without contrast to further characterize liver lesion.  4.  Continue omeprazole 20 mg twice daily.  5.  Will discuss with Dr. Abbey Chatters if he recommends repeat EGD due to shallow gastric ulcerations.  No recommendations regarding repeat exam were made at the time of EGD. If she needs repeat EGD, we will follow-up in about 4 weeks to arrange this to allow a little more time for improvement of  anasarca.  6.  Continue to avoid all NSAIDs.    Aliene Altes, PA-C [facility] 12/01/2020

## 2020-12-01 ENCOUNTER — Encounter: Payer: Self-pay | Admitting: Gastroenterology

## 2020-12-01 ENCOUNTER — Telehealth: Payer: Self-pay

## 2020-12-01 ENCOUNTER — Other Ambulatory Visit (HOSPITAL_COMMUNITY)
Admission: RE | Admit: 2020-12-01 | Discharge: 2020-12-01 | Disposition: A | Discharge status: Home / Self Care | Payer: Managed Care, Other (non HMO) | Source: Ambulatory Visit | Attending: Gastroenterology | Admitting: Gastroenterology

## 2020-12-01 ENCOUNTER — Other Ambulatory Visit (HOSPITAL_COMMUNITY)
Admission: RE | Admit: 2020-12-01 | Discharge: 2020-12-01 | Disposition: A | Discharge status: Home / Self Care | Payer: Managed Care, Other (non HMO) | Source: Ambulatory Visit | Attending: Physician Assistant | Admitting: Physician Assistant

## 2020-12-01 ENCOUNTER — Ambulatory Visit (INDEPENDENT_AMBULATORY_CARE_PROVIDER_SITE_OTHER): Payer: 59 | Admitting: Gastroenterology

## 2020-12-01 ENCOUNTER — Other Ambulatory Visit: Payer: Self-pay

## 2020-12-01 VITALS — BP 115/88 | HR 88 | Temp 97.0°F | Ht 63.0 in | Wt 294.2 lb

## 2020-12-01 DIAGNOSIS — D649 Anemia, unspecified: Secondary | ICD-10-CM | POA: Insufficient documentation

## 2020-12-01 DIAGNOSIS — R17 Unspecified jaundice: Secondary | ICD-10-CM

## 2020-12-01 DIAGNOSIS — K2100 Gastro-esophageal reflux disease with esophagitis, without bleeding: Secondary | ICD-10-CM | POA: Diagnosis not present

## 2020-12-01 DIAGNOSIS — K769 Liver disease, unspecified: Secondary | ICD-10-CM | POA: Diagnosis not present

## 2020-12-01 DIAGNOSIS — Z79899 Other long term (current) drug therapy: Secondary | ICD-10-CM

## 2020-12-01 LAB — CBC WITH DIFFERENTIAL/PLATELET
Abs Immature Granulocytes: 0.02 10*3/uL (ref 0.00–0.07)
Basophils Absolute: 0.1 10*3/uL (ref 0.0–0.1)
Basophils Relative: 1 %
Eosinophils Absolute: 0.2 10*3/uL (ref 0.0–0.5)
Eosinophils Relative: 2 %
HCT: 40.6 % (ref 36.0–46.0)
Hemoglobin: 12.9 g/dL (ref 12.0–15.0)
Immature Granulocytes: 0 %
Lymphocytes Relative: 15 %
Lymphs Abs: 0.9 10*3/uL (ref 0.7–4.0)
MCH: 29.5 pg (ref 26.0–34.0)
MCHC: 31.8 g/dL (ref 30.0–36.0)
MCV: 92.7 fL (ref 80.0–100.0)
Monocytes Absolute: 0.5 10*3/uL (ref 0.1–1.0)
Monocytes Relative: 7 %
Neutro Abs: 4.6 10*3/uL (ref 1.7–7.7)
Neutrophils Relative %: 75 %
Platelets: 147 10*3/uL — ABNORMAL LOW (ref 150–400)
RBC: 4.38 MIL/uL (ref 3.87–5.11)
RDW: 16 % — ABNORMAL HIGH (ref 11.5–15.5)
WBC: 6.3 10*3/uL (ref 4.0–10.5)
nRBC: 0 % (ref 0.0–0.2)

## 2020-12-01 LAB — HEPATIC FUNCTION PANEL
ALT: 15 U/L (ref 0–44)
AST: 30 U/L (ref 15–41)
Albumin: 4.4 g/dL (ref 3.5–5.0)
Alkaline Phosphatase: 67 U/L (ref 38–126)
Bilirubin, Direct: 0.7 mg/dL — ABNORMAL HIGH (ref 0.0–0.2)
Indirect Bilirubin: 2.7 mg/dL — ABNORMAL HIGH (ref 0.3–0.9)
Total Bilirubin: 3.4 mg/dL — ABNORMAL HIGH (ref 0.3–1.2)
Total Protein: 7.7 g/dL (ref 6.5–8.1)

## 2020-12-01 LAB — BASIC METABOLIC PANEL
Anion gap: 14 (ref 5–15)
BUN: 31 mg/dL — ABNORMAL HIGH (ref 6–20)
CO2: 29 mmol/L (ref 22–32)
Calcium: 9.6 mg/dL (ref 8.9–10.3)
Chloride: 94 mmol/L — ABNORMAL LOW (ref 98–111)
Creatinine, Ser: 2 mg/dL — ABNORMAL HIGH (ref 0.44–1.00)
GFR, Estimated: 31 mL/min — ABNORMAL LOW (ref >60)
Glucose, Bld: 92 mg/dL (ref 70–99)
Potassium: 3.1 mmol/L — ABNORMAL LOW (ref 3.5–5.1)
Sodium: 137 mmol/L (ref 135–145)

## 2020-12-01 LAB — MAGNESIUM: Magnesium: 2 mg/dL (ref 1.7–2.4)

## 2020-12-01 LAB — IRON AND TIBC
Iron: 78 ug/dL (ref 28–170)
Saturation Ratios: 18 % (ref 10.4–31.8)
TIBC: 431 ug/dL (ref 250–450)
UIBC: 353 ug/dL

## 2020-12-01 LAB — FERRITIN: Ferritin: 80 ng/mL (ref 11–307)

## 2020-12-01 NOTE — Telephone Encounter (Signed)
PA for MRI abd w/wo contrast started via [facility]. Case# [id number]. Will upload clinical notes when OV note is completed.

## 2020-12-01 NOTE — Patient Instructions (Addendum)
Please have labs completed at [facility].   Continue omepraozle 20 mg BID for now.   Continue iron for now.   Continue to avoid all NSAID products.   I will talk with Dr. Abbey Chatters to determine if you need a repeat EGD.   Aliene Altes, PA-C [facility]

## 2020-12-02 ENCOUNTER — Encounter: Payer: Self-pay | Admitting: Gastroenterology

## 2020-12-02 ENCOUNTER — Telehealth: Payer: Self-pay

## 2020-12-02 NOTE — Telephone Encounter (Addendum)
-----   Message from Lonn Georgia, PA-C sent at 12/01/2020  7:20 PM EDT ----- Please confirm that she is taking Kdur 20 meq 2 tabs 3 x day. Ask how often she has taken metolazone lately >> for every metolazone tab, she needs to take 2 extra Kdur Her kidney function is higher than previous, may be related to the metolazone Needs to severely limit sodium, 1500 mg qd, fluids 1500 cc/day If she has been compliant w/ Kdur and not on metolazone, change base dose to 3 tabs am and lunch, 2 tabs pm Take 2 extra Kdur tabs to make up the deficit. Thanks      I spoke with patient.She takes Metolazone every Saturday. Do you still want to change her potassium dosing to 20 meq, 3 tabs am,lunch and 2 tabs pm ?

## 2020-12-02 NOTE — Progress Notes (Signed)
Cc'ed to pcp °

## 2020-12-02 NOTE — Telephone Encounter (Signed)
Clinical notes uploaded to case. Case went to clinical review.

## 2020-12-03 ENCOUNTER — Ambulatory Visit: Payer: 59 | Attending: Pulmonary Disease | Admitting: Pulmonary Disease

## 2020-12-03 ENCOUNTER — Other Ambulatory Visit: Payer: Self-pay

## 2020-12-03 DIAGNOSIS — G4733 Obstructive sleep apnea (adult) (pediatric): Secondary | ICD-10-CM | POA: Insufficient documentation

## 2020-12-06 NOTE — Progress Notes (Signed)
Cardiology Office Note    Date:  12/07/2020   ID:  KAMARYN GRIMLEY, DOB 1976/07/01, MRN [id number]  PCP:  [name], MD  Cardiologist: Rozann Lesches, MD    Chief Complaint  Patient presents with  . Follow-up    1 month visit    History of Present Illness:    Lauren Osborn is a 45 y.o. female with past medical history of chronic diastolic CHF, HTN and GERD who presents to the office today for 66-month follow-up.   She was last examined by Rosaria Ferries, PA-C on 11/05/2020 and her weight had declined by 20 lbs since her last office visit after being started on Metolazone. Still had lower extremity edema but her symptoms had improved with titration of diuretic therapy and the use of compression stockings. Her weight was at 310 lbs on the office scales and she was continued on Torsemide 60mg  in AM/40mg  in PM and encouraged to only take Metolazone 2.5mg  as needed. Repeat labs on 3/30 showed her K+ was low at 3.1 and creatinine was at 2.00 (baseline 1.4 - 1.5, at 2.03 on 11/04/2020).    In talking with the patient today, she reports significant improvement in her symptoms since her last office visit. Her weight has also declined by an additional 21 pounds since her last office visit and she is now close to baseline. Weight is listed at 289 lbs on the office scales today but she is at 282 lbs on her home scales. She still has some dyspnea on exertion but says this has overall significantly improved. No recent orthopnea or PND. She did have a sleep study earlier this month and is awaiting the results. She is wearing compression stockings for her lower extremity edema and has been trying to follow a low-sodium diet. She also reduced her soda intake. She denies any chest pain or palpitations. She has been taking her Metolazone approximately once per week.   Past Medical History:  Diagnosis Date  . Diastolic congestive heart failure ([place])   . Essential hypertension   . Fibroids   . GERD  (gastroesophageal reflux disease)   . Gout   . Morbid obesity ([place])   . Thyroid nodule     Past Surgical History:  Procedure Laterality Date  . BIOPSY  08/03/2020   Procedure: BIOPSY;  Surgeon: Eloise Harman, DO;  [facility];  Service: Endoscopy;;  duodenal gastric  . CESAREAN SECTION    . COLONOSCOPY WITH PROPOFOL N/A 08/03/2020    Surgeon: Hurshel Keys K, DO; 5 mm tubular adenoma in the sigmoid colon, nonbleeding internal hemorrhoids.  Recommended repeat in 5 years.  . ESOPHAGOGASTRODUODENOSCOPY (EGD) WITH PROPOFOL N/A 08/03/2020   Surgeon: Eloise Harman, DO; LA grade C esophagitis without bleeding, gastritis with erythema and shallow ulcerations in gastric antrum biopsied (negative for H. pylori), normal examined duodenum s/p biopsy (benign).  . HYSTERECTOMY ABDOMINAL WITH SALPINGECTOMY  2008  . POLYPECTOMY  08/03/2020   Procedure: POLYPECTOMY;  Surgeon: Eloise Harman, DO;  [facility];  Service: Endoscopy;;  colon  . THYROIDECTOMY Right 10/21/2018   Procedure: RIGHT HEMITHYROIDECTOMY;  Surgeon: Leta Baptist, MD;  Location: [facility];  Service: ENT;  Laterality: Right;    Current Medications: Outpatient Medications Prior to Visit  Medication Sig Dispense Refill  . acetaminophen (TYLENOL) 500 MG tablet Take 1,000 mg by mouth every 6 (six) hours as needed for moderate pain or headache.    . Magnesium Oxide 400  MG CAPS Take 1 capsule (400 mg total) by mouth daily. 30 capsule 2  . metolazone (ZAROXOLYN) 2.5 MG tablet Take 1 Tablet weekly as needed for weight gain 5 tablet 6  . omeprazole (PRILOSEC) 20 MG capsule Take 1 capsule (20 mg total) by mouth 2 (two) times daily. 60 capsule 5  . potassium chloride SA (KLOR-CON) 20 MEQ tablet Take 2 tablets (40 mEq total) by mouth 3 (three) times daily. 180 tablet 2  . torsemide (DEMADEX) 20 MG tablet Take 60 mg (3 tablets) in the am and 40 mg ( 2 tablets) in the pm 150 tablet 3  . FEROSUL 325  (65 Fe) MG tablet Take 325 mg by mouth 2 (two) times daily.     No facility-administered medications prior to visit.     Allergies:   Oxycodone-acetaminophen   Social History   Socioeconomic History  . Marital status: Single    Spouse name: Not on file  . Number of children: Not on file  . Years of education: Not on file  . Highest education level: Not on file  Occupational History  . Not on file  Tobacco Use  . Smoking status: Former Smoker    Packs/day: 0.50    Years: 20.00    Pack years: 10.00    Types: Cigarettes    Quit date: 2017    Years since quitting: 5.2  . Smokeless tobacco: Never Used  Vaping Use  . Vaping Use: Never used  Substance and Sexual Activity  . Alcohol use: Not Currently  . Drug use: Never  . Sexual activity: Yes    Comment: hyst  Other Topics Concern  . Not on file  Social History Narrative  . Not on file   Social Determinants of Health   Financial Resource Strain: Not on file  Food Insecurity: Not on file  Transportation Needs: Not on file  Physical Activity: Not on file  Stress: Not on file  Social Connections: Not on file     Family History:  The patient's family history includes Breast cancer in her mother; Colon cancer (age of onset: 70) in her father; Colon polyps (age of onset: 84) in her sister; Congestive Heart Failure in her maternal grandfather; Diabetes in her daughter; Diverticulitis in her brother and sister; Other in her maternal grandmother and paternal grandfather.   Review of Systems:   Please see the history of present illness.     General:  No chills, fever, night sweats or weight changes.  Cardiovascular:  No chest pain, orthopnea, palpitations, paroxysmal nocturnal dyspnea. Positive for dyspnea on exertion and edema (improved).  Dermatological: No rash, lesions/masses Respiratory: No cough, dyspnea Urologic: No hematuria, dysuria Abdominal:   No nausea, vomiting, diarrhea, bright red blood per rectum, melena, or  hematemesis Neurologic:  No visual changes, wkns, changes in mental status. All other systems reviewed and are otherwise negative except as noted above.   Physical Exam:    VS:  BP 130/74   Pulse 94   Ht 5\' 3"  (1.6 m)   Wt 289 lb (131.1 kg)   SpO2 97%   BMI 51.19 kg/m    General: Well developed obese female appearing in no acute distress. Head: Normocephalic, atraumatic. Neck: No carotid bruits. JVD not elevated.  Lungs: Respirations regular and unlabored, without wheezes or rales.  Heart: Regular rate and rhythm. No S3 or S4.  No murmur, no rubs, or gallops appreciated. Abdomen: Appears non-distended. No obvious abdominal masses. Msk:  Strength and tone appear  normal for age. No obvious joint deformities or effusions. Extremities: No clubbing or cyanosis. Chronic appearing edema. Compression stockings in place.  Distal pedal pulses are 2+ bilaterally. Neuro: Alert and oriented X 3. Moves all extremities spontaneously. No focal deficits noted. Psych:  Responds to questions appropriately with a normal affect. Skin: No rashes or lesions noted  Wt Readings from Last 3 Encounters:  12/07/20 289 lb (131.1 kg)  12/01/20 294 lb 3.2 oz (133.4 kg)  11/16/20 (!) 308 lb 12.8 oz (140.1 kg)     Studies/Labs Reviewed:   EKG:  EKG is not ordered today.    Recent Labs: 09/15/2020: B Natriuretic Peptide 318.0 09/17/2020: TSH 4.037 12/01/2020: ALT 15; BUN 31; Creatinine, Ser 2.00; Hemoglobin 12.9; Magnesium 2.0; Platelets 147; Potassium 3.1; Sodium 137   Lipid Panel No results found for: CHOL, TRIG, HDL, CHOLHDL, VLDL, LDLCALC, LDLDIRECT  Additional studies/ records that were reviewed today include:   Echocardiogram: 09/2020 IMPRESSIONS    1. Left ventricular ejection fraction, by estimation, is 65 to 70%. The  left ventricle has normal function. The left ventricle has no regional  wall motion abnormalities. Left ventricular diastolic parameters were  normal.  2. RV not well  visualized. Grossly appears enlarged. Function appears to  be decreased but difficult to quantify. Intermittent septal bounce likely  secondary to RV strain, as there is no echocardiographic signs of  constrictive pericarditis (normal mitral  inflow, normal MV tissue Doppler). . Right ventricular systolic function  was not well visualized. The right ventricular size is not well  visualized.  3. The mitral valve is normal in structure. No evidence of mitral valve  regurgitation. No evidence of mitral stenosis.  4. The aortic valve was not well visualized. Aortic valve regurgitation  is not visualized. No aortic stenosis is present.   Assessment:    1. Acute diastolic heart failure ([place])   2. Medication management   3. Hypokalemia   4. AKI (acute kidney injury) ([place])      Plan:   In order of problems listed above:  1. Acute Diastolic CHF Exacerbation - Her her weight has declined by an additional 21 pounds since her last office visit and she is now close to her baseline weight. She continues to have some dyspnea on exertion and edema but symptoms have overall significantly improved. Given her recent AKI with diuretic therapy, will try reducing Torsemide from 60mg  in AM/40mg  in PM to 40mg  BID and continue on Metolazone 2.5mg  once weekly as she had significant weight gain while on Torsemide alone. Will recheck a BMET in 2 weeks. Will continue to make gradual adjustments given her significant fluctuations in weight. Continued compliance with a low-sodium diet was encouraged.  2.  Hypokalemia - Her K+ was low at 3.1 by recent labs. She has increased her K-dur dosing and takes an additional 40 mEq on the days she takes Metolazone. Will continue current potassium supplementation at this time with plans to reduce Torsemide as outlined above which should help with her K+ levels. Repeat BMET in 2 weeks.   3. Acute on Chronic Stage 3 CKD - Baseline creatinine 1.4 - 1.5 by review of recent  labs.  Peaked at 2.03 in 11/2020 and at 2.00 by most recent labs. Repeat BMET in 2 weeks.     Medication Adjustments/Labs and Tests Ordered: Current medicines are reviewed at length with the patient today.  Concerns regarding medicines are outlined above.  Medication changes, Labs and Tests ordered today are listed in the  Patient Instructions below. Patient Instructions  Potassium Content of Foods  Potassium is a mineral found in many foods and drinks. It affects how the heart works, and helps keep fluids and minerals balanced in the body. The amount of potassium you need each day depends on your age and any medical conditions you may have. Talk to your health care provider or dietitian about how much potassium you need. The following lists of foods provide the general serving size for foods and the approximate amount of potassium in each serving, listed in milligrams (mg). Actual values may vary depending on the product and how it is processed. High in potassium The following foods and beverages have 200 mg or more of potassium per serving:  Apricots (raw) - 2 have 200 mg of potassium.  Apricots (dry) - 5 have 200 mg of potassium.  Artichoke - 1 medium has 345 mg of potassium.  Avocado -  fruit has 245 mg of potassium.  Banana - 1 medium fruit has 425 mg of potassium.  [place] or baked beans (canned) -  cup has 280 mg of potassium.  White beans (canned) -  cup has 595 mg potassium.  Beef roast - 3 oz has 320 mg of potassium.  Ground beef - 3 oz has 270 mg of potassium.  Beets (raw or cooked) -  cup has 260 mg of potassium.  Bran muffin - 2 oz has 300 mg of potassium.  Broccoli (cooked) -  cup has 230 mg of potassium.  Brussels sprouts -  cup has 250 mg of potassium.  Cantaloupe -  cup has 215 mg of potassium.  Cereal, 100% bran -  cup has 200-400 mg of potassium.  Cheeseburger -1 single fast food burger has 225-400 mg of potassium.  Chicken - 3 oz has 220 mg of  potassium.  Clams (canned) - 3 oz has 535 mg of potassium.  Crab - 3 oz has 225 mg of potassium.  Dates - 5 have 270 mg of potassium.  Dried beans and peas -  cup has 300-475 mg of potassium.  Figs (dried) - 2 have 260 mg of potassium.  Fish (halibut, tuna, cod, snapper) - 3 oz has 480 mg of potassium.  Fish (salmon, haddock, swordfish, perch) - 3 oz has 300 mg of potassium.  Fish (tuna, canned) - 3 oz has 200 mg of potassium.  [place] fries (fast food) - 3 oz has 470 mg of potassium.  Granola with fruit and nuts -  cup has 200 mg of potassium.  Grapefruit juice -  cup has 200 mg of potassium.  Honeydew melon -  cup has 200 mg of potassium.  Kale (raw) - 1 cup has 300 mg of potassium.  Kiwi - 1 medium fruit has 240 mg of potassium.  Kohlrabi, rutabaga, parsnips -  cup has 280 mg of potassium.  Lentils -  cup has 365 mg of potassium.  Mango - 1 each has 325 mg of potassium.  Milk (nonfat, low-fat, whole, buttermilk) - 1 cup has 350-380 mg of potassium.  Milk (chocolate) - 1 cup has 420 mg of potassium  Molasses - 1 Tbsp has 295 mg of potassium.  Mushrooms -  cup has 280 mg of potassium.  Nectarine - 1 each has 275 mg of potassium.  Nuts (almonds, peanuts, hazelnuts, [place], cashew, mixed) - 1 oz has 200 mg of potassium.  Nuts (pistachios) - 1 oz has 295 mg of potassium.  Orange - 1 fruit has 240 mg  of potassium.  Orange juice -  cup has 235 mg of potassium.  Papaya -  medium fruit has 390 mg of potassium.  Peanut butter (chunky) - 2 Tbsp has 240 mg of potassium.  Peanut butter (smooth) - 2 Tbsp has 210 mg of potassium.  Pear - 1 medium (200 mg) of potassium.  Pomegranate - 1 whole fruit has 400 mg of potassium.  Pomegranate juice -  cup has 215 mg of potassium.  Pork - 3 oz has 350 mg of potassium.  Potato chips (salted) - 1 oz has 465 mg of potassium.  Potato (baked with skin) - 1 medium has 925 mg of potassium.  Potato (boiled) -  cup  has 255 mg of potassium.  Potato (Mashed) -  cup has 330 mg of potassium.  Prune juice -  cup has 370 mg of potassium.  Prunes - 5 have 305 mg of potassium.  Pudding (chocolate) -  cup has 230 mg of potassium.  Pumpkin (canned) -  cup has 250 mg of potassium.  Raisins (seedless) -  cup has 270 mg of potassium.  Seeds (sunflower or pumpkin) - 1 oz has 240 mg of potassium.  Soy milk - 1 cup has 300 mg of potassium.  Spinach (cooked) - 1/2 cup has 420 mg of potassium.  Spinach (canned) -  cup has 370 mg of potassium.  Sweet potato (baked with skin) - 1 medium has 450 mg of potassium.  Swiss chard -  cup has 480 mg of potassium.  Tomato or vegetable juice -  cup has 275 mg of potassium.  Tomato (sauce or puree) -  cup has 400-550 mg of potassium.  Tomato (raw) - 1 medium has 290 mg of potassium.  Tomato (canned) -  cup has 200-300 mg of potassium.  [place] - 3 oz has 250 mg of potassium.  Wheat germ - 1 oz has 250 mg of potassium.  Winter squash -  cup has 250 mg of potassium.  Yogurt (plain or fruited) - 6 oz has 260-435 mg of potassium.  Zucchini -  cup has 220 mg of potassium. Moderate in potassium The following foods and beverages have 50-200 mg of potassium per serving:  Apple - 1 fruit has 150 mg of potassium  Apple juice -  cup has 150 mg of potassium  Applesauce -  cup has 90 mg of potassium  Apricot nectar -  cup has 140 mg of potassium  Asparagus (small spears) -  cup has 155 mg of potassium  Asparagus (large spears) - 6 have 155 mg of potassium  Bagel (cinnamon raisin) - 1 four-inch bagel has 130 mg of potassium  Bagel (egg or plain) - 1 four- inch bagel has 70 mg of potassium  Beans (green) -  cup has 90 mg of potassium  Beans (yellow) -  cup has 190 mg of potassium  Beer, regular - 12 oz has 100 mg of potassium  Beets (canned) -  cup has 125 mg of potassium  Blackberries -  cup has 115 mg of potassium  Blueberries -   cup has 60 mg of potassium  Bread (whole wheat) - 1 slice has 70 mg of potassium  Broccoli (raw) -  cup has 145 mg of potassium  Cabbage -  cup has 150 mg of potassium  Carrots (cooked or raw) -  cup has 180 mg of potassium  Cauliflower (raw) -  cup has 150 mg of potassium  Celery (raw) -  cup  has 155 mg of potassium  Cereal, bran flakes -  cup has 120-150 mg of potassium  Cheese (cottage) -  cup has 110 mg of potassium  Cherries - 10 have 150 mg of potassium  Chocolate - 1 oz bar has 165 mg of potassium  Coffee (brewed) - 6 oz has 90 mg of potassium  Corn -  cup or 1 ear has 195 mg of potassium  Cucumbers -  cup has 80 mg of potassium  Egg - 1 large egg has 60 mg of potassium  Eggplant -  cup has 60 mg of potassium  Endive (raw) -  cup has 80 mg of potassium  English muffin - 1 has 65 mg of potassium  Fish (ocean perch) - 3 oz has 192 mg of potassium  Frankfurter, beef or pork - 1 has 75 mg of potassium  Fruit cocktail -  cup has 115 mg of potassium  Grape juice -  cup has 170 mg of potassium  Grapefruit -  fruit has 175 mg of potassium  Grapes -  cup has 155 mg of potassium  Greens: kale, turnip, collard -  cup has 110-150 mg of potassium  Ice cream or frozen yogurt (chocolate) -  cup has 175 mg of potassium  Ice cream or frozen yogurt (vanilla) -  cup has 120-150 mg of potassium  Lemons, limes - 1 each has 80 mg of potassium  Lettuce - 1 cup has 100 mg of potassium  Mixed vegetables -  cup has 150 mg of potassium  Mushrooms, raw -  cup has 110 mg of potassium  Nuts (walnuts, pecans, or macadamia) - 1 oz has 125 mg of potassium  Oatmeal -  cup has 80 mg of potassium  Okra -  cup has 110 mg of potassium  Onions -  cup has 120 mg of potassium  Peach - 1 has 185 mg of potassium  Peaches (canned) -  cup has 120 mg of potassium  Pears (canned) -  cup has 120 mg of potassium  Peas, green (frozen) -  cup has 90 mg of  potassium  Peppers (Green) -  cup has 130 mg of potassium  Peppers (Red) -  cup has 160 mg of potassium  Pineapple juice -  cup has 165 mg of potassium  Pineapple (fresh or canned) -  cup has 100 mg of potassium  Plums - 1 has 105 mg of potassium  Pudding, vanilla -  cup has 150 mg of potassium  Raspberries -  cup has 90 mg of potassium  Rhubarb -  cup has 115 mg of potassium  Rice, wild -  cup has 80 mg of potassium  Shrimp - 3 oz has 155 mg of potassium  Spinach (raw) - 1 cup has 170 mg of potassium  Strawberries -  cup has 125 mg of potassium  Summer squash -  cup has 175-200 mg of potassium  Swiss chard (raw) - 1 cup has 135 mg of potassium  Tangerines - 1 fruit has 140 mg of potassium  Tea, brewed - 6 oz has 65 mg of potassium  Turnips -  cup has 140 mg of potassium  Watermelon -  cup has 85 mg of potassium  Wine (Red, table) - 5 oz has 180 mg of potassium  Wine (White, table) - 5 oz 100 mg of potassium Low in potassium The following foods and beverages have less than 50 mg of potassium per serving.  Bread (white) -  1 slice has 30 mg of potassium  Carbonated beverages - 12 oz has less than 5 mg of potassium  Cheese - 1 oz has 20-30 mg of potassium  Cranberries -  cup has 45 mg of potassium  Cranberry juice cocktail -  cup has 20 mg of potassium  Fats and oils - 1 Tbsp has less than 5 mg of potassium  Hummus - 1 Tbsp has 32 mg of potassium  Nectar (papaya, mango, or pear) -  cup has 35 mg of potassium  Rice (white or brown) -  cup has 50 mg of potassium  Spaghetti or macaroni (cooked) -  cup has 30 mg of potassium  Tortilla, flour or corn - 1 has 50 mg of potassium  Waffle - 1 four-inch waffle has 50 mg of potassium  Water chestnuts -  cup has 40 mg of potassium Summary  Potassium is a mineral found in many foods and drinks. It affects how the heart works, and helps keep fluids and minerals balanced in the body.  The  amount of potassium you need each day depends on your age and any existing medical conditions you may have. Your health care provider or dietitian may recommend an amount of potassium that you should have each day. This information is not intended to replace advice given to you by your health care provider. Make sure you discuss any questions you have with your health care provider. Document Revised: 08/03/2017 Document Reviewed: 11/15/2016 Elsevier Patient Education  [place].   Medication Instructions:  DECREASE Torsemide to 40 mg twice a day   *If you need a refill on your cardiac medications before your next appointment, please call your pharmacy*   Lab Work: BMET in 2 weeks ( 12/22/20)  If you have labs (blood work) drawn today and your tests are completely normal, you will receive your results only by: Marland Kitchen MyChart Message (if you have MyChart) OR . A paper copy in the mail If you have any lab test that is abnormal or we need to change your treatment, we will call you to review the results.   Testing/Procedures: None today   Follow-Up: At [facility], you and your health needs are our priority.  As part of our continuing mission to provide you with exceptional heart care, we have created designated Provider Care Teams.  These Care Teams include your primary Cardiologist (physician) and Advanced Practice Providers (APPs -  Physician Assistants and Nurse Practitioners) who all work together to provide you with the care you need, when you need it.  We recommend signing up for the patient portal called "MyChart".  Sign up information is provided on this After Visit Summary.  MyChart is used to connect with patients for Virtual Visits (Telemedicine).  Patients are able to view lab/test results, encounter notes, upcoming appointments, etc.  Non-urgent messages can be sent to your provider as well.   To learn more about what you can do with MyChart, go to  [email or web address].    Your next appointment:   6-8  week(s)  The format for your next appointment:   In Person  Provider:   You may see Rozann Lesches, MD or one of the following Advanced Practice Providers on your designated Care Team:    Bernerd Pho, PA-C       Other Instructions None.Lauren Osborn, Erma Heritage, PA-C  12/07/2020 4:37 PM    [facility] [address],  [place] [address] Phone: [phone] Fax: [phone]

## 2020-12-07 ENCOUNTER — Other Ambulatory Visit: Payer: Self-pay

## 2020-12-07 ENCOUNTER — Encounter: Payer: Self-pay | Admitting: Student

## 2020-12-07 ENCOUNTER — Telehealth: Payer: Self-pay | Admitting: Internal Medicine

## 2020-12-07 ENCOUNTER — Ambulatory Visit (INDEPENDENT_AMBULATORY_CARE_PROVIDER_SITE_OTHER): Payer: 59 | Admitting: Student

## 2020-12-07 ENCOUNTER — Telehealth: Payer: Self-pay | Admitting: Pulmonary Disease

## 2020-12-07 VITALS — BP 130/74 | HR 94 | Ht 63.0 in | Wt 289.0 lb

## 2020-12-07 DIAGNOSIS — N179 Acute kidney failure, unspecified: Secondary | ICD-10-CM

## 2020-12-07 DIAGNOSIS — G4733 Obstructive sleep apnea (adult) (pediatric): Secondary | ICD-10-CM

## 2020-12-07 DIAGNOSIS — I5031 Acute diastolic (congestive) heart failure: Secondary | ICD-10-CM

## 2020-12-07 DIAGNOSIS — Z79899 Other long term (current) drug therapy: Secondary | ICD-10-CM | POA: Diagnosis not present

## 2020-12-07 DIAGNOSIS — E876 Hypokalemia: Secondary | ICD-10-CM

## 2020-12-07 MED ORDER — TORSEMIDE 20 MG PO TABS: 40.0000 mg | ORAL_TABLET | Freq: Two times a day (BID) | ORAL | 3 refills | Status: DC

## 2020-12-07 NOTE — Telephone Encounter (Signed)
MRI approved by [employer]. PA# [id number], valid 12/07/20-06/05/21.

## 2020-12-07 NOTE — Telephone Encounter (Signed)
Noted  

## 2020-12-07 NOTE — Telephone Encounter (Signed)
[employer] called with approval for her procedure   #[id number]  Good for 180 days

## 2020-12-07 NOTE — Telephone Encounter (Signed)
  PSG shows severe OSA, AHI 39/hour. Suggest auto CPAP 5 to 15 cm, mask of choice.  Office visit 4 to 6 weeks after starting

## 2020-12-07 NOTE — Telephone Encounter (Signed)
Patient to be seen in clinic today 12/07/20

## 2020-12-07 NOTE — Procedures (Signed)
Patient Name: Lauren Osborn, Lauren Osborn Date: 12/03/2020 Gender: Female D.O.B: 28-May-1976 Age (years): 50 Referring Provider: Kara Mead MD, ABSM Height (inches): 63 Interpreting Physician: Kara Mead MD, ABSM Weight (lbs): 294 RPSGT: Rosebud Poles BMI: 52 MRN: [id number] Neck Size: 15.50 <br> <br> CLINICAL INFORMATION Sleep Study Type: NPSG   Indication for sleep study: N/A  Epworth Sleepiness Score: 11   SLEEP STUDY TECHNIQUE As per the AASM Manual for the Scoring of Sleep and Associated Events v2.3 (April 2016) with a hypopnea requiring 4% desaturations.  The channels recorded and monitored were frontal, central and occipital EEG, electrooculogram (EOG), submentalis EMG (chin), nasal and oral airflow, thoracic and abdominal wall motion, anterior tibialis EMG, snore microphone, electrocardiogram, and pulse oximetry.  MEDICATIONS Medications self-administered by patient taken the night of the study : N/A  SLEEP ARCHITECTURE The study was initiated at 10:33:41 PM and ended at 5:07:14 AM.  Sleep onset time was 94.7 minutes and the sleep efficiency was 55.5%. The total sleep time was 218.3 minutes.  Stage REM latency was 58.0 minutes.  The patient spent 8.70% of the night in stage N1 sleep, 59.69% in stage N2 sleep, 16.95% in stage N3 and 14.7% in REM.  Alpha intrusion was absent.  Supine sleep was 0.00%.  RESPIRATORY PARAMETERS The overall apnea/hypopnea index (AHI) was 39.3 per hour. There were 6 total apneas, including 1 obstructive, 5 central and 0 mixed apneas. There were 137 hypopneas and 0 RERAs.  The AHI during Stage REM sleep was 65.6 per hour.  AHI while supine was N/A per hour. Supine sleep was not noted.  The mean oxygen saturation was 87.20%. The minimum SpO2 during sleep was 69.00%.  moderate snoring was noted during this study.  CARDIAC DATA The 2 lead EKG demonstrated sinus rhythm. The mean heart rate was 89.58 beats per minute. Other EKG findings  include: None.  LEG MOVEMENT DATA The total PLMS were 0 with a resulting PLMS index of 0.00. Associated arousal with leg movement index was 0.0 .  IMPRESSIONS - Severe obstructive sleep apnea occurred during this study (AHI = 39.3/h). - No significant central sleep apnea occurred during this study (CAI = 1.4/h). - Severe oxygen desaturation was noted during this study (Min O2 = 69.00%). - The patient snored with moderate snoring volume. - No cardiac abnormalities were noted during this study. - Clinically significant periodic limb movements did not occur during sleep. No significant associated arousals.   DIAGNOSIS - Obstructive Sleep Apnea (G47.33)   RECOMMENDATIONS - Therapeutic CPAP titration to determine optimal pressure required to alleviate sleep disordered breathing. Alternatively autoCPAP can be used - Avoid alcohol, sedatives and other CNS depressants that may worsen sleep apnea and disrupt normal sleep architecture. - Sleep hygiene should be reviewed to assess factors that may improve sleep quality. - Weight management and regular exercise should be initiated or continued if appropriate.   Kara Mead MD Board Certified in [place]

## 2020-12-07 NOTE — Patient Instructions (Addendum)
Potassium Content of Foods  Potassium is a mineral found in many foods and drinks. It affects how the heart works, and helps keep fluids and minerals balanced in the body. The amount of potassium you need each day depends on your age and any medical conditions you may have. Talk to your health care provider or dietitian about how much potassium you need. The following lists of foods provide the general serving size for foods and the approximate amount of potassium in each serving, listed in milligrams (mg). Actual values may vary depending on the product and how it is processed. High in potassium The following foods and beverages have 200 mg or more of potassium per serving:  Apricots (raw) - 2 have 200 mg of potassium.  Apricots (dry) - 5 have 200 mg of potassium.  Artichoke - 1 medium has 345 mg of potassium.  Avocado -  fruit has 245 mg of potassium.  Banana - 1 medium fruit has 425 mg of potassium.  [place] or baked beans (canned) -  cup has 280 mg of potassium.  White beans (canned) -  cup has 595 mg potassium.  Beef roast - 3 oz has 320 mg of potassium.  Ground beef - 3 oz has 270 mg of potassium.  Beets (raw or cooked) -  cup has 260 mg of potassium.  Bran muffin - 2 oz has 300 mg of potassium.  Broccoli (cooked) -  cup has 230 mg of potassium.  Brussels sprouts -  cup has 250 mg of potassium.  Cantaloupe -  cup has 215 mg of potassium.  Cereal, 100% bran -  cup has 200-400 mg of potassium.  Cheeseburger -1 single fast food burger has 225-400 mg of potassium.  Chicken - 3 oz has 220 mg of potassium.  Clams (canned) - 3 oz has 535 mg of potassium.  Crab - 3 oz has 225 mg of potassium.  Dates - 5 have 270 mg of potassium.  Dried beans and peas -  cup has 300-475 mg of potassium.  Figs (dried) - 2 have 260 mg of potassium.  Fish (halibut, tuna, cod, snapper) - 3 oz has 480 mg of potassium.  Fish (salmon, haddock, swordfish, perch) - 3 oz has 300 mg of  potassium.  Fish (tuna, canned) - 3 oz has 200 mg of potassium.  [place] fries (fast food) - 3 oz has 470 mg of potassium.  Granola with fruit and nuts -  cup has 200 mg of potassium.  Grapefruit juice -  cup has 200 mg of potassium.  Honeydew melon -  cup has 200 mg of potassium.  Kale (raw) - 1 cup has 300 mg of potassium.  Kiwi - 1 medium fruit has 240 mg of potassium.  Kohlrabi, rutabaga, parsnips -  cup has 280 mg of potassium.  Lentils -  cup has 365 mg of potassium.  Mango - 1 each has 325 mg of potassium.  Milk (nonfat, low-fat, whole, buttermilk) - 1 cup has 350-380 mg of potassium.  Milk (chocolate) - 1 cup has 420 mg of potassium  Molasses - 1 Tbsp has 295 mg of potassium.  Mushrooms -  cup has 280 mg of potassium.  Nectarine - 1 each has 275 mg of potassium.  Nuts (almonds, peanuts, hazelnuts, [place], cashew, mixed) - 1 oz has 200 mg of potassium.  Nuts (pistachios) - 1 oz has 295 mg of potassium.  Orange - 1 fruit has 240 mg of potassium.  Orange juice -  cup has 235 mg of potassium.  Papaya -  medium fruit has 390 mg of potassium.  Peanut butter (chunky) - 2 Tbsp has 240 mg of potassium.  Peanut butter (smooth) - 2 Tbsp has 210 mg of potassium.  Pear - 1 medium (200 mg) of potassium.  Pomegranate - 1 whole fruit has 400 mg of potassium.  Pomegranate juice -  cup has 215 mg of potassium.  Pork - 3 oz has 350 mg of potassium.  Potato chips (salted) - 1 oz has 465 mg of potassium.  Potato (baked with skin) - 1 medium has 925 mg of potassium.  Potato (boiled) -  cup has 255 mg of potassium.  Potato (Mashed) -  cup has 330 mg of potassium.  Prune juice -  cup has 370 mg of potassium.  Prunes - 5 have 305 mg of potassium.  Pudding (chocolate) -  cup has 230 mg of potassium.  Pumpkin (canned) -  cup has 250 mg of potassium.  Raisins (seedless) -  cup has 270 mg of potassium.  Seeds (sunflower or pumpkin) - 1 oz has 240 mg of  potassium.  Soy milk - 1 cup has 300 mg of potassium.  Spinach (cooked) - 1/2 cup has 420 mg of potassium.  Spinach (canned) -  cup has 370 mg of potassium.  Sweet potato (baked with skin) - 1 medium has 450 mg of potassium.  Swiss chard -  cup has 480 mg of potassium.  Tomato or vegetable juice -  cup has 275 mg of potassium.  Tomato (sauce or puree) -  cup has 400-550 mg of potassium.  Tomato (raw) - 1 medium has 290 mg of potassium.  Tomato (canned) -  cup has 200-300 mg of potassium.  Turkey - 3 oz has 250 mg of potassium.  Wheat germ - 1 oz has 250 mg of potassium.  Winter squash -  cup has 250 mg of potassium.  Yogurt (plain or fruited) - 6 oz has 260-435 mg of potassium.  Zucchini -  cup has 220 mg of potassium. Moderate in potassium The following foods and beverages have 50-200 mg of potassium per serving:  Apple - 1 fruit has 150 mg of potassium  Apple juice -  cup has 150 mg of potassium  Applesauce -  cup has 90 mg of potassium  Apricot nectar -  cup has 140 mg of potassium  Asparagus (small spears) -  cup has 155 mg of potassium  Asparagus (large spears) - 6 have 155 mg of potassium  Bagel (cinnamon raisin) - 1 four-inch bagel has 130 mg of potassium  Bagel (egg or plain) - 1 four- inch bagel has 70 mg of potassium  Beans (green) -  cup has 90 mg of potassium  Beans (yellow) -  cup has 190 mg of potassium  Beer, regular - 12 oz has 100 mg of potassium  Beets (canned) -  cup has 125 mg of potassium  Blackberries -  cup has 115 mg of potassium  Blueberries -  cup has 60 mg of potassium  Bread (whole wheat) - 1 slice has 70 mg of potassium  Broccoli (raw) -  cup has 145 mg of potassium  Cabbage -  cup has 150 mg of potassium  Carrots (cooked or raw) -  cup has 180 mg of potassium  Cauliflower (raw) -  cup has 150 mg of potassium  Celery (raw) -  cup has 155 mg of potassium  Cereal, bran   flakes -  cup has 120-150 mg  of potassium  Cheese ([place]) -  cup has 110 mg of potassium  Cherries - 10 have 150 mg of potassium  Chocolate - 1 oz bar has 165 mg of potassium  Coffee (brewed) - 6 oz has 90 mg of potassium  Corn -  cup or 1 ear has 195 mg of potassium  Cucumbers -  cup has 80 mg of potassium  Egg - 1 large egg has 60 mg of potassium  Eggplant -  cup has 60 mg of potassium  Endive (raw) -  cup has 80 mg of potassium  English muffin - 1 has 65 mg of potassium  Fish (ocean perch) - 3 oz has 192 mg of potassium  Frankfurter, beef or pork - 1 has 75 mg of potassium  Fruit cocktail -  cup has 115 mg of potassium  Grape juice -  cup has 170 mg of potassium  Grapefruit -  fruit has 175 mg of potassium  Grapes -  cup has 155 mg of potassium  Greens: kale, turnip, collard -  cup has 110-150 mg of potassium  Ice cream or frozen yogurt (chocolate) -  cup has 175 mg of potassium  Ice cream or frozen yogurt (vanilla) -  cup has 120-150 mg of potassium  Lemons, limes - 1 each has 80 mg of potassium  Lettuce - 1 cup has 100 mg of potassium  Mixed vegetables -  cup has 150 mg of potassium  Mushrooms, raw -  cup has 110 mg of potassium  Nuts (walnuts, pecans, or macadamia) - 1 oz has 125 mg of potassium  Oatmeal -  cup has 80 mg of potassium  Okra -  cup has 110 mg of potassium  Onions -  cup has 120 mg of potassium  Peach - 1 has 185 mg of potassium  Peaches (canned) -  cup has 120 mg of potassium  Pears (canned) -  cup has 120 mg of potassium  Peas, green (frozen) -  cup has 90 mg of potassium  Peppers (Green) -  cup has 130 mg of potassium  Peppers (Red) -  cup has 160 mg of potassium  Pineapple juice -  cup has 165 mg of potassium  Pineapple (fresh or canned) -  cup has 100 mg of potassium  Plums - 1 has 105 mg of potassium  Pudding, vanilla -  cup has 150 mg of potassium  Raspberries -  cup has 90 mg of potassium  Rhubarb -  cup has  115 mg of potassium  Rice, wild -  cup has 80 mg of potassium  Shrimp - 3 oz has 155 mg of potassium  Spinach (raw) - 1 cup has 170 mg of potassium  Strawberries -  cup has 125 mg of potassium  Summer squash -  cup has 175-200 mg of potassium  Swiss chard (raw) - 1 cup has 135 mg of potassium  Tangerines - 1 fruit has 140 mg of potassium  Tea, brewed - 6 oz has 65 mg of potassium  Turnips -  cup has 140 mg of potassium  Watermelon -  cup has 85 mg of potassium  Wine (Red, table) - 5 oz has 180 mg of potassium  Wine (White, table) - 5 oz 100 mg of potassium Low in potassium The following foods and beverages have less than 50 mg of potassium per serving.  Bread (white) - 1 slice has 30 mg of potassium    Carbonated beverages - 12 oz has less than 5 mg of potassium  Cheese - 1 oz has 20-30 mg of potassium  Cranberries -  cup has 45 mg of potassium  Cranberry juice cocktail -  cup has 20 mg of potassium  Fats and oils - 1 Tbsp has less than 5 mg of potassium  Hummus - 1 Tbsp has 32 mg of potassium  Nectar (papaya, mango, or pear) -  cup has 35 mg of potassium  Rice (white or brown) -  cup has 50 mg of potassium  Spaghetti or macaroni (cooked) -  cup has 30 mg of potassium  Tortilla, flour or corn - 1 has 50 mg of potassium  Waffle - 1 four-inch waffle has 50 mg of potassium  Water chestnuts -  cup has 40 mg of potassium Summary  Potassium is a mineral found in many foods and drinks. It affects how the heart works, and helps keep fluids and minerals balanced in the body.  The amount of potassium you need each day depends on your age and any existing medical conditions you may have. Your health care provider or dietitian may recommend an amount of potassium that you should have each day. This information is not intended to replace advice given to you by your health care provider. Make sure you discuss any questions you have with your health care  provider. Document Revised: 08/03/2017 Document Reviewed: 11/15/2016 Elsevier Patient Education  [place].   Medication Instructions:  DECREASE Torsemide to 40 mg twice a day   *If you need a refill on your cardiac medications before your next appointment, please call your pharmacy*   Lab Work: BMET in 2 weeks ( 12/22/20)  If you have labs (blood work) drawn today and your tests are completely normal, you will receive your results only by: Marland Kitchen MyChart Message (if you have MyChart) OR . A paper copy in the mail If you have any lab test that is abnormal or we need to change your treatment, we will call you to review the results.   Testing/Procedures: None today   Follow-Up: At [facility], you and your health needs are our priority.  As part of our continuing mission to provide you with exceptional heart care, we have created designated Provider Care Teams.  These Care Teams include your primary Cardiologist (physician) and Advanced Practice Providers (APPs -  Physician Assistants and Nurse Practitioners) who all work together to provide you with the care you need, when you need it.  We recommend signing up for the patient portal called "MyChart".  Sign up information is provided on this After Visit Summary.  MyChart is used to connect with patients for Virtual Visits (Telemedicine).  Patients are able to view lab/test results, encounter notes, upcoming appointments, etc.  Non-urgent messages can be sent to your provider as well.   To learn more about what you can do with MyChart, go to [email or web address].    Your next appointment:   6-8  week(s)  The format for your next appointment:   In Person  Provider:   You may see Rozann Lesches, MD or one of the following Advanced Practice Providers on your designated Care Team:    Bernerd Pho, PA-C       Other Instructions None.Marland Kitchen

## 2020-12-07 NOTE — Telephone Encounter (Signed)
PA for MRI submitted via [employer] website for [employer]. Case pending. Request tracking ID: [id number].

## 2020-12-08 NOTE — Telephone Encounter (Signed)
Called and went over PSG results per Dr Elsworth Soho with patient. All questions answered and patient agreeable to Dr Bari Mantis recommendation. Order placed per Dr Elsworth Soho. Patient expressed full understanding to call and schedule office visit 4-6 weeks after starting CPAP.

## 2020-12-10 ENCOUNTER — Telehealth: Payer: Self-pay | Admitting: Gastroenterology

## 2020-12-10 NOTE — Telephone Encounter (Signed)
Please let patient know that Dr. Abbey Chatters does not recommend repeating an EGD. She can continue with recommendations made at her last OV. Would like to arrange a follow-up in 3 months.

## 2020-12-13 ENCOUNTER — Telehealth: Payer: Self-pay | Admitting: Student

## 2020-12-13 ENCOUNTER — Encounter: Payer: Self-pay | Admitting: Internal Medicine

## 2020-12-13 NOTE — Telephone Encounter (Signed)
Patient called stating that since she last saw B. Strader, PA-C on 12/07/2020, patient has gained an avg of 10 lbs. Patient stated that on 4/5 in office, she weighed 282 lb and on 4/10 she weighed 291 lb. She states that this morning 4/11 she weighed 288 lb. Patient states she has some SOB, but denies CP, dizziness, and lightheadedness.  Patient states that she still takes her Torsemide 20 mg tablets- 40 mg in the am and 40 mg in the pm. She also stated that she took Metolazone 2.5 mg tablet on Saturday and also took two extra Klor Con 20 mEq tablets ad directed.

## 2020-12-13 NOTE — Telephone Encounter (Signed)
    It is odd that her weight increased if she took Metolazone on Saturday but it looks like it did come down today. I would have her take an extra Metolazone tablet either this afternoon or tomorrow morning. Try to take approximately 30 minutes prior to taking Torsemide so this in the most effective. If weight continues to increase, we will have to go back up on her Torsemide (recently reduced from 60mg  in AM/40mg  in PM to 40mg  BID). Please have her call with weights later this week as well.   Signed, Erma Heritage, PA-C 12/13/2020, 2:55 PM Pager: [phone]

## 2020-12-13 NOTE — Telephone Encounter (Signed)
Received fax from [facility]. Request for MRI was voided. Member has other primary insurance on file. Please file with primary carrier as they are responsible for payment.

## 2020-12-13 NOTE — Telephone Encounter (Signed)
Noted. Spoke with pt. Pt is aware that EGD isn't recommended. Pt will follow recommendations from last ov. Pt is aware of her next apt.

## 2020-12-13 NOTE — Telephone Encounter (Signed)
Pt called stating that her weight has gone up 10lbs since she saw B. Strader last week. States it's coming down but it's still to high.  Please call [phone]

## 2020-12-13 NOTE — Telephone Encounter (Signed)
Contacted patient who verbalized understanding. Patient will track weight and let us know this week.

## 2020-12-16 ENCOUNTER — Ambulatory Visit (HOSPITAL_COMMUNITY)
Admission: RE | Admit: 2020-12-16 | Discharge: 2020-12-16 | Disposition: A | Discharge status: Home / Self Care | Payer: Managed Care, Other (non HMO) | Source: Ambulatory Visit | Attending: Gastroenterology | Admitting: Gastroenterology

## 2020-12-16 ENCOUNTER — Other Ambulatory Visit (HOSPITAL_COMMUNITY)
Admission: RE | Admit: 2020-12-16 | Discharge: 2020-12-16 | Disposition: A | Discharge status: Home / Self Care | Payer: Managed Care, Other (non HMO) | Source: Ambulatory Visit | Attending: Student | Admitting: Student

## 2020-12-16 ENCOUNTER — Other Ambulatory Visit: Payer: Self-pay

## 2020-12-16 DIAGNOSIS — R188 Other ascites: Secondary | ICD-10-CM | POA: Diagnosis not present

## 2020-12-16 DIAGNOSIS — K7689 Other specified diseases of liver: Secondary | ICD-10-CM | POA: Diagnosis not present

## 2020-12-16 DIAGNOSIS — K769 Liver disease, unspecified: Secondary | ICD-10-CM | POA: Insufficient documentation

## 2020-12-16 DIAGNOSIS — Z79899 Other long term (current) drug therapy: Secondary | ICD-10-CM | POA: Insufficient documentation

## 2020-12-16 DIAGNOSIS — K8020 Calculus of gallbladder without cholecystitis without obstruction: Secondary | ICD-10-CM | POA: Diagnosis not present

## 2020-12-16 DIAGNOSIS — R160 Hepatomegaly, not elsewhere classified: Secondary | ICD-10-CM | POA: Diagnosis not present

## 2020-12-16 LAB — BASIC METABOLIC PANEL
Anion gap: 15 (ref 5–15)
BUN: 30 mg/dL — ABNORMAL HIGH (ref 6–20)
CO2: 28 mmol/L (ref 22–32)
Calcium: 9.5 mg/dL (ref 8.9–10.3)
Chloride: 94 mmol/L — ABNORMAL LOW (ref 98–111)
Creatinine, Ser: 1.72 mg/dL — ABNORMAL HIGH (ref 0.44–1.00)
GFR, Estimated: 37 mL/min — ABNORMAL LOW (ref >60)
Glucose, Bld: 93 mg/dL (ref 70–99)
Potassium: 3.4 mmol/L — ABNORMAL LOW (ref 3.5–5.1)
Sodium: 137 mmol/L (ref 135–145)

## 2020-12-16 IMAGING — MR MR ABDOMEN WO/W CM
18 series · 48 of 48 positions shown · IV contrast (10 ml Gadavist)
Comparison: Abdominal ultrasound, [DATE]

CLINICAL DATA: Characterize liver lesion identified by prior MR

EXAM:
MRI ABDOMEN WITHOUT AND WITH CONTRAST
TECHNIQUE: Multiplanar multisequence MR imaging of the abdomen was performed
both before and after the administration of intravenous contrast.
CONTRAST:  10mL GADAVIST GADOBUTROL 1 MMOL/ML IV SOLN

[Series 3: cor haste · coronal · 6.0mm · 1.25mm/px · 1 of 50 slices shown]
[im 1/50]
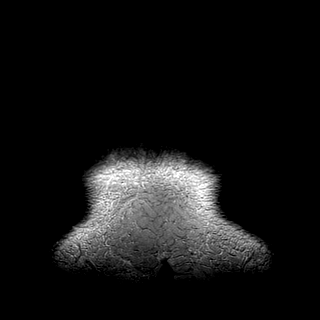

[Series 4: ax haste · axial · 6.0mm · 1.56mm/px · 1 of 48 slices shown]
[im 1/48]
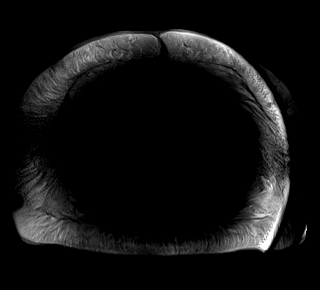

[Series 5: T2 fat-sat · axial · 6.0mm · 1.56mm/px · 1 of 45 slices shown]
[im 1/45]
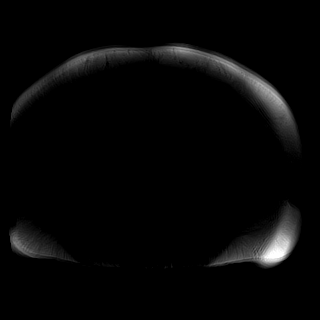

[Series 6: DWI · axial · 6.0mm · 1.87mm/px · 1 of 58 slices shown (1 of 4)]
[im 1/58]
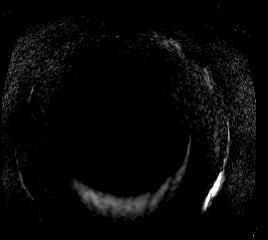

[Series 6: DWI · axial · 6.0mm · 1.87mm/px · 1 of 58 slices shown (2 of 4)]
[im 1/58]
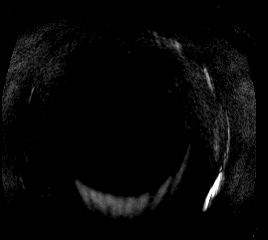

[Series 6: DWI · axial · 6.0mm · 1.87mm/px · 1 of 58 slices shown (3 of 4)]
[im 1/58]
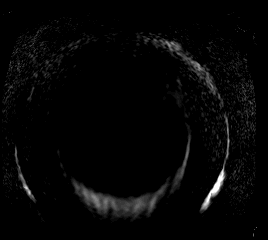

[Series 7: DWI · axial · 6.0mm · 1.87mm/px · z∈[-351,+60]mm · 2 of 58 slices shown (4 of 4)]
[im 1/58]
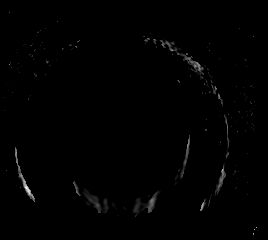
[im 58/58]
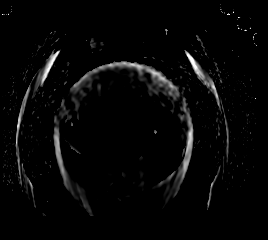

[Series 8: bSSFP · axial · 6.0mm · 0.98mm/px · z∈[-322,+32]mm · 2 of 60 slices shown]
[im 1/60]
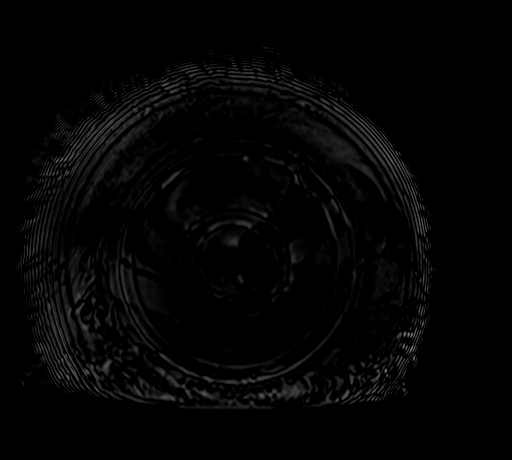
[im 60/60]
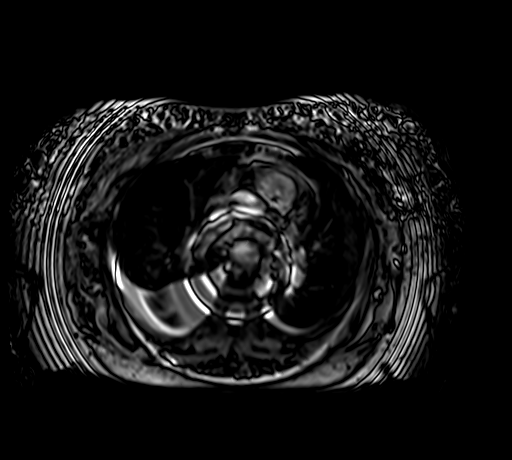

[Series 9: ax in and · axial · 3.0mm · 1.56mm/px · z∈[-273,+12]mm · 3 of 96 slices shown (1 of 2)]
[im 1/96]
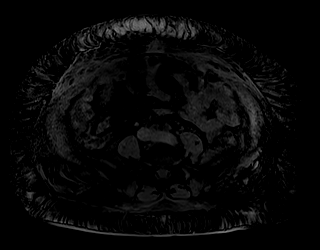
[im 48/96]
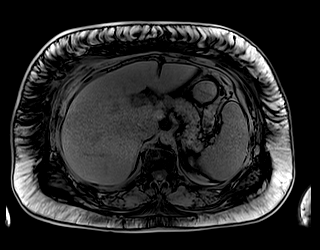
[im 96/96]
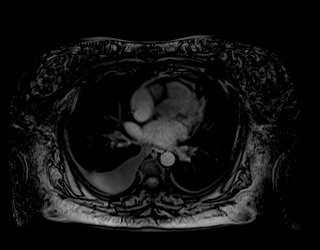

[Series 10: ax in and · axial · 3.0mm · 1.56mm/px · z∈[-273,+12]mm · 3 of 96 slices shown (2 of 2)]
[im 1/96]
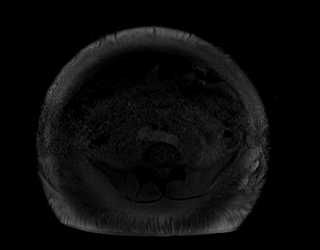
[im 48/96]
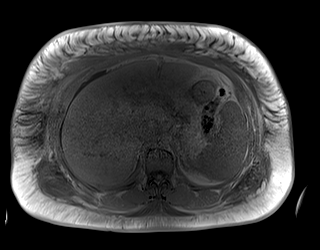
[im 96/96]
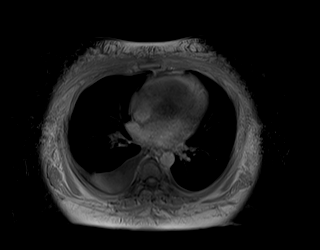

[Series 11: T1 dynamic · axial · non-contrast · 3.0mm · 1.56mm/px · z∈[-289,+44]mm · 4 of 112 slices shown (1 of 4)]
[im 1/112]
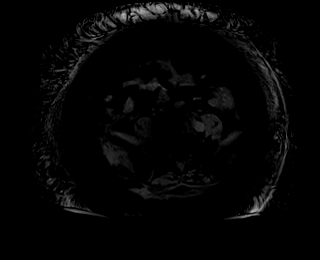
[im 38/112]
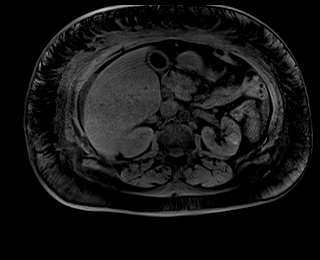
[im 75/112]
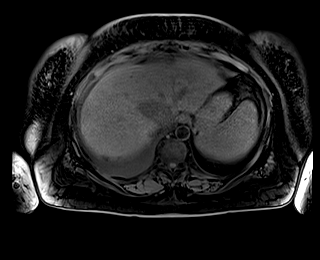
[im 112/112]
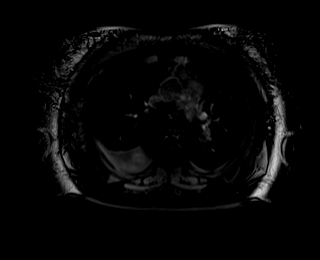

[Series 13: T1 dynamic post-contrast · axial · 3.0mm · 1.56mm/px · z∈[-289,+44]mm · 4 of 112 slices shown (1 of 4)]
[im 1/112]
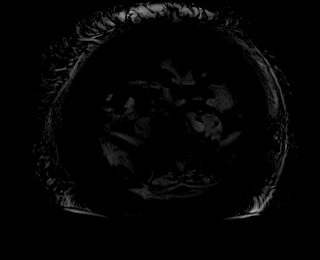
[im 38/112]
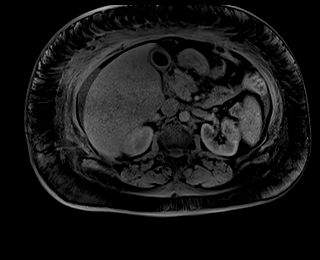
[im 75/112]
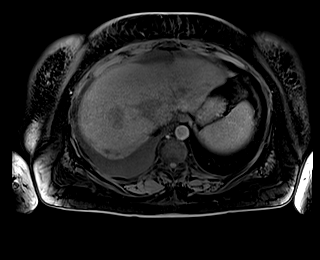
[im 112/112]
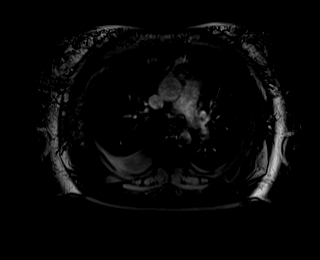

[Series 14: T1 dynamic · axial · 3.0mm · 1.56mm/px · z∈[-289,+44]mm · 4 of 112 slices shown (2 of 4)]
[im 1/112]
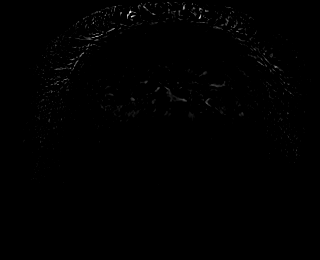
[im 38/112]
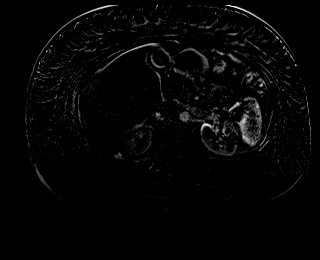
[im 75/112]
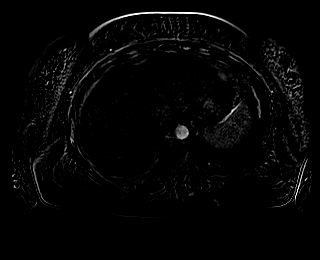
[im 112/112]
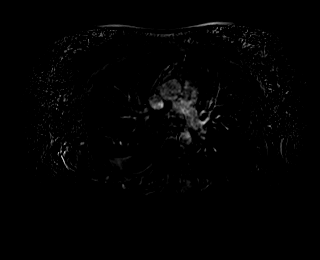

[Series 15: T1 dynamic post-contrast · axial · 3.0mm · 1.56mm/px · z∈[-289,+44]mm · 4 of 112 slices shown (2 of 4)]
[im 1/112]
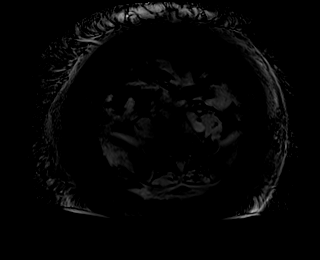
[im 38/112]
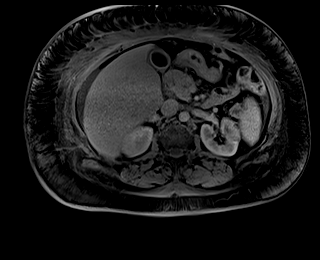
[im 75/112]
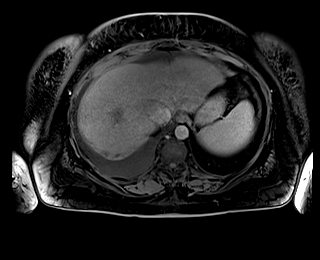
[im 112/112]
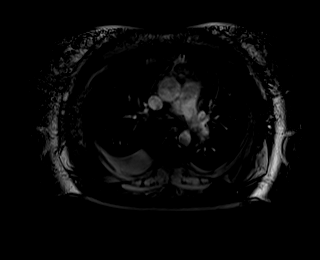

[Series 16: T1 dynamic · axial · 3.0mm · 1.56mm/px · z∈[-289,+44]mm · 4 of 112 slices shown (3 of 4)]
[im 1/112]
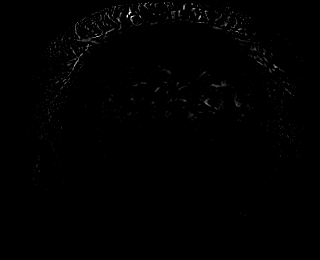
[im 38/112]
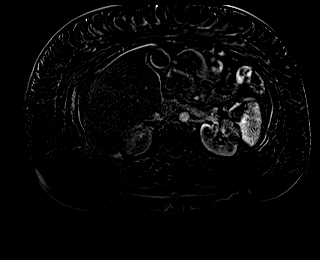
[im 75/112]
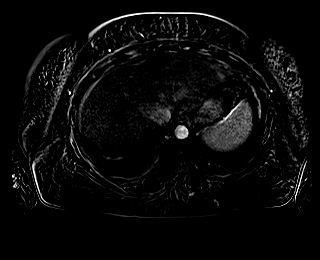
[im 112/112]
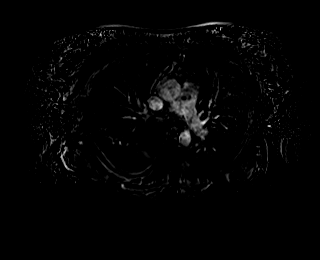

[Series 17: T1 dynamic post-contrast · axial · 3.0mm · 1.56mm/px · z∈[-289,+44]mm · 4 of 112 slices shown (3 of 4)]
[im 1/112]
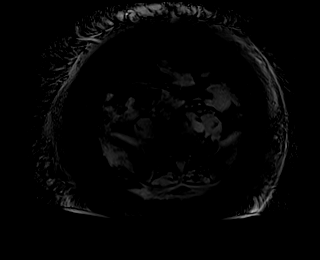
[im 38/112]
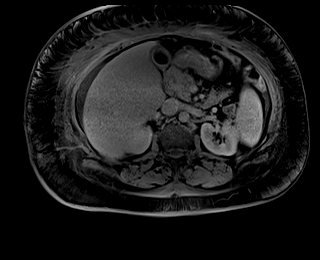
[im 75/112]
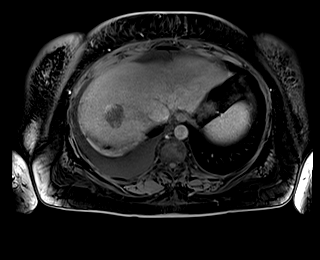
[im 112/112]
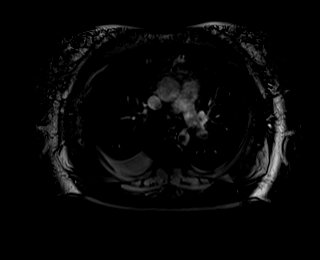

[Series 18: T1 dynamic · axial · 3.0mm · 1.56mm/px · z∈[-289,+44]mm · 4 of 112 slices shown (4 of 4)]
[im 1/112]
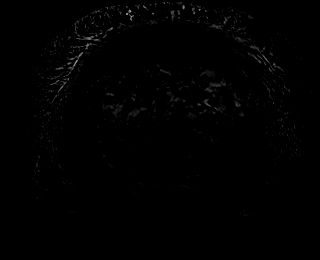
[im 38/112]
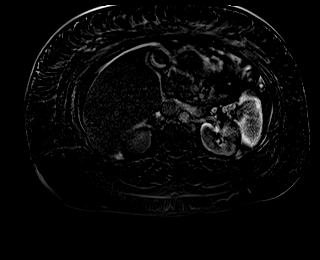
[im 75/112]
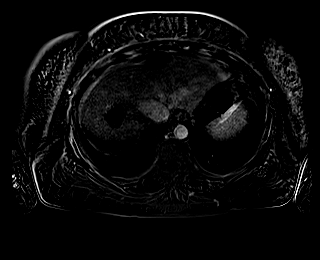
[im 112/112]
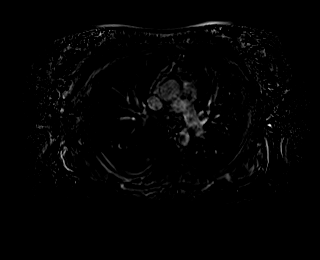

[Series 19: T1 dynamic post-contrast · coronal · 3.0mm · 1.56mm/px · 4 of 120 slices shown (4 of 4)]
[im 1/120]
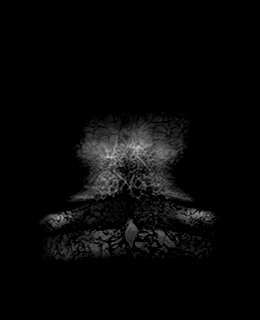
[im 40/120]
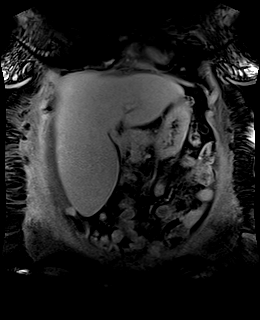
[im 80/120]
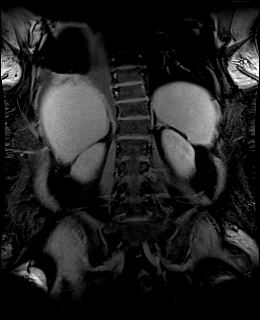
[im 120/120]
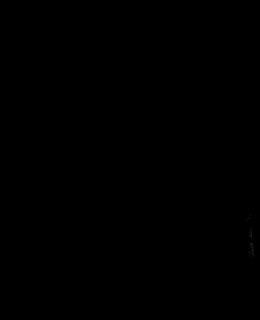

[48 of 48 positions shown; findings below may reference images not displayed]

FINDINGS: Lower chest: Moderate right, small left pleural effusions and
associated atelectasis or consolidation.

Hepatobiliary: Hepatomegaly, maximum coronal span 24.5 cm. There is
a subcapsular fluid signal lesion of the central hepatic dome,
hepatic segment VII, measuring 4.0 x 3.3 cm and demonstrating
peripheral discontinuous nodular enhancement (series 15, image 35).
No solid mass or other parenchymal abnormality identified. Gallstone
in the gallbladder. No biliary ductal dilatation.

Pancreas: No mass, inflammatory changes, or other parenchymal
abnormality identified.

Spleen:  Splenomegaly, maximum coronal span 14.9 cm.

Adrenals/Urinary Tract: No masses identified. No evidence of
hydronephrosis.

Stomach/Bowel: Visualized portions within the abdomen are
unremarkable.

Vascular/Lymphatic: No pathologically enlarged lymph nodes
identified. No abdominal aortic aneurysm demonstrated.

Other:  Severe anasarca.  Small volume perihepatic ascites.

Musculoskeletal: No suspicious bone lesions identified.
IMPRESSION: 1. There is a subcapsular fluid signal lesion of the central hepatic
dome, hepatic segment VII, measuring 4.0 x 3.3 cm and demonstrating
peripheral discontinuous nodular enhancement. This is consistent
with a benign hemangioma. No further follow-up or characterization
is required.
2. Hepatomegaly, maximum coronal span 24.5 cm.
3. Small volume perihepatic ascites.
4. Severe anasarca.
5. Moderate right, small left pleural effusions and associated
atelectasis or consolidation.
6. Splenomegaly, maximum coronal span 14.9 cm.
7. Cholelithiasis.

## 2020-12-16 MED ORDER — GADOBUTROL 1 MMOL/ML IV SOLN
10.0000 mL | Freq: Once | INTRAVENOUS | Status: AC | PRN
  Administered 2020-12-16: 10 mL via INTRAVENOUS

## 2020-12-17 ENCOUNTER — Telehealth: Payer: Self-pay

## 2020-12-17 NOTE — Telephone Encounter (Signed)
    I am glad that her weight is back to baseline. Thank you for the update.  Signed, Erma Heritage, PA-C 12/17/2020, 8:53 AM

## 2020-12-17 NOTE — Telephone Encounter (Signed)
-----   Message from Erma Heritage, [place] sent at 12/16/2020  4:17 PM EDT ----- Please let the patient know her kidney function has improved! Creatinine has improved from 2.00 to 1.72. Her K+ is slightly low but trending upwards, now at 3.4. Please see how her weight has been since taking extra Metolazone earlier this week.

## 2020-12-17 NOTE — Telephone Encounter (Signed)
Contacted patient who verbalized understanding of result note. Patient stated weight for the week are as follows: 12/13/20- 288 lb 12/14/20- 289 lb 12/15/20- 288 lb 12/16/20- 286 lb 12/17/20- 285 lb Patient stated that she had no questions or concerns at this time.

## 2020-12-23 ENCOUNTER — Other Ambulatory Visit: Payer: Self-pay | Admitting: Student

## 2021-01-04 ENCOUNTER — Other Ambulatory Visit: Payer: Self-pay | Admitting: *Deleted

## 2021-01-04 MED ORDER — POTASSIUM CHLORIDE CRYS ER 20 MEQ PO TBCR: 40.0000 meq | EXTENDED_RELEASE_TABLET | Freq: Three times a day (TID) | ORAL | 6 refills | Status: DC

## 2021-01-17 ENCOUNTER — Other Ambulatory Visit: Payer: Self-pay | Admitting: Student

## 2021-02-03 ENCOUNTER — Ambulatory Visit: Payer: 59 | Admitting: Student

## 2021-02-03 ENCOUNTER — Telehealth: Payer: Self-pay | Admitting: Pulmonary Disease

## 2021-02-03 NOTE — Telephone Encounter (Signed)
I have called and LM on VM for the pt to call us back.  Looks like she is set up with [facility].

## 2021-02-08 NOTE — Telephone Encounter (Signed)
[employer] in [place].

## 2021-02-08 NOTE — Telephone Encounter (Signed)
PCC's are there any DME companies in [facility]? Thanks.

## 2021-02-09 NOTE — Telephone Encounter (Signed)
Called and spoke to pt. Pt states she wants to change to [place] and does not want to use [facility].   PCCs are you able to change this? Pt states she hasnt heard from [facility] at all.

## 2021-02-10 NOTE — Telephone Encounter (Signed)
I called and spoke with Mateo Flow with [facility] she confirmed that she got this order but they are not in network with [employer] [facility] which is primary.  She suggested that I call [facility]. I did call [facility] but they are not in network with [employer] and stated that [employer] has in network and out of network coverage. Valerie with [facility] stated she was going to call the patient and inform her that they are not in network

## 2021-02-10 NOTE — Telephone Encounter (Signed)
Referral order and records have been faxed to [facility] per the patient's request

## 2021-02-14 ENCOUNTER — Telehealth: Payer: Self-pay | Admitting: Cardiology

## 2021-02-14 NOTE — Telephone Encounter (Signed)
Patient called stating her legs are swelling to the point of weeping, she's weak, having trouble catching her breath, and her weight has went up and down the past few days.   Please call pt @ [phone]

## 2021-02-14 NOTE — Telephone Encounter (Signed)
    With her weight gain and associated symptoms, I would recommend she take an extra Metolazone today or tomorrow pending whichever works best with her schedule and take at least an extra 40 mEq of K-dur. If having significant weeping, a referral to PT/Lymphedema Clinic for compression wraps would be an option. I am happy to refer her if that would work with her schedule.   Signed, Erma Heritage, PA-C 02/14/2021, 6:38 PM Pager: [phone]

## 2021-02-14 NOTE — Telephone Encounter (Signed)
I spoke with patient. Her weight on 6/11 was 284 lbs, then on 6/12 it was 288 lbs   Today (6/13) wt is 287 lbs   She says she takes zaroxolyn twice a week even though he instructed her to take it once a week. 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   She takes torsemide 40 mg BID

## 2021-02-15 NOTE — Telephone Encounter (Signed)
I spoke with patient and she will take zaroxolyn tomorrow as she did not bring any to work. At this pint she will wait on PT/compression stockings as she states she cannot tolerate compression on her legs. She will update [place] her status later.

## 2021-02-18 ENCOUNTER — Ambulatory Visit (INDEPENDENT_AMBULATORY_CARE_PROVIDER_SITE_OTHER): Payer: Managed Care, Other (non HMO) | Admitting: Student

## 2021-02-18 ENCOUNTER — Other Ambulatory Visit: Payer: Self-pay

## 2021-02-18 ENCOUNTER — Encounter: Payer: Self-pay | Admitting: Student

## 2021-02-18 VITALS — BP 128/78 | HR 87 | Ht 63.0 in | Wt 290.6 lb

## 2021-02-18 DIAGNOSIS — G4733 Obstructive sleep apnea (adult) (pediatric): Secondary | ICD-10-CM | POA: Diagnosis not present

## 2021-02-18 DIAGNOSIS — I5031 Acute diastolic (congestive) heart failure: Secondary | ICD-10-CM | POA: Diagnosis not present

## 2021-02-18 DIAGNOSIS — I10 Essential (primary) hypertension: Secondary | ICD-10-CM | POA: Diagnosis not present

## 2021-02-18 DIAGNOSIS — Z79899 Other long term (current) drug therapy: Secondary | ICD-10-CM

## 2021-02-18 MED ORDER — METOLAZONE 2.5 MG PO TABS: ORAL_TABLET | ORAL | 3 refills | Status: DC

## 2021-02-18 NOTE — Progress Notes (Signed)
Cardiology Office Note    Date:  02/18/2021   ID:  AKSHITA ITALIANO, DOB 1975-10-02, MRN [id number]  PCP:  [name], MD  Cardiologist: Rozann Lesches, MD    Chief Complaint  Patient presents with   Follow-up    Worsening edema and weight gain    History of Present Illness:    Lauren Osborn is a 45 y.o. female with past medical history of HFpEF, GERD and severe OSA who presents to the office today for evaluation of worsening lower extremity edema..   She was last examined by myself in 12/2020 and reported that her volume status had significantly improved since being started on Metolazone 2.5 mg. Her weight was listed as 289 lbs on the office scales and it 282 lbs on her home scales. Torsemide was reduced from 60 mg in AM/40 mg in PM to 40 mg twice daily and she was continued on Metolazone 2.5 mg once weekly.  She did call the office earlier this week reporting worsening weeping along her legs and dyspnea for several days. Her weight was at 287 lbs and she had taken Metolazone on 02/13/2021 and was encouraged to utilize an additional Metolazone tablet. Also reviewed possible referral to the Lymphedema Clinic but she wished to wait on that at the time.  In talking with the patient today, she reports worsening lower extremity edema and dyspnea on exertion over the past week. By review of her daily weight log, her weight was down to 278 lbs on 02/07/2021 and is now up to 290 lbs on her home scales. She denies any significant change in her dietary habits or fluid intake. Says she has been compliant with her diuretic and has utilized Metolazone twice this week. Reports dyspnea on exertion. No orthopnea or PND. She was referred for a CPAP but has not had success in obtaining this thus far. Was most recently sent to [facility] but they were not in-network.   Past Medical History:  Diagnosis Date   Diastolic congestive heart failure (HCC)    Essential hypertension    Fibroids     GERD (gastroesophageal reflux disease)    Gout    Morbid obesity ([place])    Thyroid nodule     Past Surgical History:  Procedure Laterality Date   BIOPSY  08/03/2020   Procedure: BIOPSY;  Surgeon: Eloise Harman, DO;  [facility];  Service: Endoscopy;;  duodenal gastric   CESAREAN SECTION     COLONOSCOPY WITH PROPOFOL N/A 08/03/2020    Surgeon: Hurshel Keys K, DO; 5 mm tubular adenoma in the sigmoid colon, nonbleeding internal hemorrhoids.  Recommended repeat in 5 years.   ESOPHAGOGASTRODUODENOSCOPY (EGD) WITH PROPOFOL N/A 08/03/2020   Surgeon: Eloise Harman, DO; LA grade C esophagitis without bleeding, gastritis with erythema and shallow ulcerations in gastric antrum biopsied (negative for H. pylori), normal examined duodenum s/p biopsy (benign).   HYSTERECTOMY ABDOMINAL WITH SALPINGECTOMY  2008   POLYPECTOMY  08/03/2020   Procedure: POLYPECTOMY;  Surgeon: Eloise Harman, DO;  [facility];  Service: Endoscopy;;  colon   THYROIDECTOMY Right 10/21/2018   Procedure: RIGHT HEMITHYROIDECTOMY;  Surgeon: Leta Baptist, MD;  Location: [place];  Service: ENT;  Laterality: Right;    Current Medications: Outpatient Medications Prior to Visit  Medication Sig Dispense Refill   acetaminophen (TYLENOL) 500 MG tablet Take 1,000 mg by mouth every 6 (six) hours as needed for moderate pain or headache.  magnesium oxide (MAG-OX) 400 MG tablet TAKE 1 TABLET BY MOUTH ONCE DAILY. 30 tablet 6   potassium chloride SA (KLOR-CON) 20 MEQ tablet Take 40 mEq by mouth 3 (three) times daily. Takes 2 additional tablets by mouth when taking metolazone     torsemide (DEMADEX) 20 MG tablet Take 2 tablets (40 mg total) by mouth 2 (two) times daily. 360 tablet 3   metolazone (ZAROXOLYN) 2.5 MG tablet Take 1 Tablet weekly as needed for weight gain 5 tablet 6   omeprazole (PRILOSEC) 20 MG capsule Take 1 capsule (20 mg total) by mouth 2 (two) times daily. 60 capsule 5    potassium chloride SA (KLOR-CON) 20 MEQ tablet Take 2 tablets (40 mEq total) by mouth 3 (three) times daily. (Patient taking differently: Take 40 mEq by mouth 3 (three) times daily. When taking metolaozone takes 2 extra tablets of potassium) 180 tablet 6   No facility-administered medications prior to visit.     Allergies:   Oxycodone-acetaminophen   Social History   Socioeconomic History   Marital status: Single    Spouse name: Not on file   Number of children: Not on file   Years of education: Not on file   Highest education level: Not on file  Occupational History   Not on file  Tobacco Use   Smoking status: Former    Packs/day: 0.50    Years: 20.00    Pack years: 10.00    Types: Cigarettes    Quit date: 2017    Years since quitting: 5.4   Smokeless tobacco: Never  Vaping Use   Vaping Use: Never used  Substance and Sexual Activity   Alcohol use: Not Currently   Drug use: Never   Sexual activity: Yes    Comment: hyst  Other Topics Concern   Not on file  Social History Narrative   Not on file   Social Determinants of Health   Financial Resource Strain: Not on file  Food Insecurity: Not on file  Transportation Needs: Not on file  Physical Activity: Not on file  Stress: Not on file  Social Connections: Not on file     Family History:  The patient's family history includes Breast cancer in her mother; Colon cancer (age of onset: 88) in her father; Colon polyps (age of onset: 70) in her sister; Congestive Heart Failure in her maternal grandfather; Diabetes in her daughter; Diverticulitis in her brother and sister; Other in her maternal grandmother and paternal grandfather.   Review of Systems:    Please see the history of present illness.     All other systems reviewed and are otherwise negative except as noted above.   Physical Exam:    VS:  BP 128/78   Pulse 87   Ht 5\' 3"  (1.6 m)   Wt 290 lb 9.6 oz (131.8 kg)   SpO2 95%   BMI 51.48 kg/m    General:  Pleasant, obese female appearing in no acute distress. Head: Normocephalic, atraumatic. Neck: No carotid bruits. JVD difficult to assess secondary to body habitus.  Lungs: Respirations regular and unlabored, rales along right base.  Heart: Regular rate and rhythm. No S3 or S4.  No murmur, no rubs, or gallops appreciated. Abdomen: Appears non-distended. No obvious abdominal masses. Msk:  Strength and tone appear normal for age. No obvious joint deformities or effusions. Extremities: No clubbing or cyanosis. 2+ pitting edema and weeping noted.  Distal pedal pulses are 2+ bilaterally. Neuro: Alert and oriented X 3. Moves  all extremities spontaneously. No focal deficits noted. Psych:  Responds to questions appropriately with a normal affect. Skin: No rashes or lesions noted  Wt Readings from Last 3 Encounters:  02/18/21 290 lb 9.6 oz (131.8 kg)  12/07/20 289 lb (131.1 kg)  12/01/20 294 lb 3.2 oz (133.4 kg)      Studies/Labs Reviewed:   EKG:  EKG is not ordered today.    Recent Labs: 09/15/2020: B Natriuretic Peptide 318.0 09/17/2020: TSH 4.037 12/01/2020: ALT 15; Hemoglobin 12.9; Magnesium 2.0; Platelets 147 12/16/2020: BUN 30; Creatinine, Ser 1.72; Potassium 3.4; Sodium 137   Lipid Panel No results found for: CHOL, TRIG, HDL, CHOLHDL, VLDL, LDLCALC, LDLDIRECT  Additional studies/ records that were reviewed today include:   Echocardiogram: 09/16/2020 IMPRESSIONS     1. Left ventricular ejection fraction, by estimation, is 65 to 70%. The  left ventricle has normal function. The left ventricle has no regional  wall motion abnormalities. Left ventricular diastolic parameters were  normal.   2. RV not well visualized. Grossly appears enlarged. Function appears to  be decreased but difficult to quantify. Intermittent septal bounce likely  secondary to RV strain, as there is no echocardiographic signs of  constrictive pericarditis (normal mitral   inflow, normal MV tissue Doppler). .  Right ventricular systolic function  was not well visualized. The right ventricular size is not well  visualized.   3. The mitral valve is normal in structure. No evidence of mitral valve  regurgitation. No evidence of mitral stenosis.   4. The aortic valve was not well visualized. Aortic valve regurgitation  is not visualized. No aortic stenosis is present.   Assessment:    1. Acute diastolic heart failure ([place])   2. Medication management   3. Essential hypertension   4. OSA (obstructive sleep apnea)      Plan:   In order of problems listed above:  1. Acute HFpEF - Her weight has increased by approximately 12 pounds within the past 2 weeks and she has experienced worsening edema and dyspnea on exertion. She is volume overloaded by examination today. I recommended that she take her Metolazone 2.5 mg for the next 3 days in a row and then resume using this twice weekly while remaining on Torsemide 40 mg twice daily. Will recheck a BNP and BMET in 1 week.   2. HTN - BP is well-controlled at 128/78 during today's visit. Continue diuretic therapy with adjustment as outlined above.  3. OSA - She has known severe OSA but has had difficulty obtaining her CPAP as this was most recently sent to [facility] but they were not in-network. I will send a message to [facility] today and I encouraged her to follow-up with them as well as I suspect this is contributing to her fluid issues as well.    Medication Adjustments/Labs and Tests Ordered: Current medicines are reviewed at length with the patient today.  Concerns regarding medicines are outlined above.  Medication changes, Labs and Tests ordered today are listed in the Patient Instructions below. Patient Instructions  Medication Instructions:     Take Metolazone 2.5 mg daily for the next 3 days and then take twice a week thereafter.   *If you need a refill on your cardiac medications before your next appointment, please  call your pharmacy*   Lab Work: BMET on 02/25/21  If you have labs (blood work) drawn today and your tests are completely normal, you will receive your results only by: [place] (if you  have MyChart) OR A paper copy in the mail If you have any lab test that is abnormal or we need to change your treatment, we will call you to review the results.   Testing/Procedures:  None today      Keep 03/11/21 apt as scheduled     Follow-Up: At [facility], you and your health needs are our priority.  As part of our continuing mission to provide you with exceptional heart care, we have created designated Provider Care Teams.  These Care Teams include your primary Cardiologist (physician) and Advanced Practice Providers (APPs -  Physician Assistants and Nurse Practitioners) who all work together to provide you with the care you need, when you need it.  We recommend signing up for the patient portal called "MyChart".  Sign up information is provided on this After Visit Summary.  MyChart is used to connect with patients for Virtual Visits (Telemedicine).  Patients are able to view lab/test results, encounter notes, upcoming appointments, etc.  Non-urgent messages can be sent to your provider as well.   To learn more about what you can do with MyChart, go to [email or web address].    Your next appointment:     Signed, Erma Heritage, PA-C  02/18/2021 4:45 PM    [place] [address] [place], [place] [address] Phone: [phone] Fax: [phone]

## 2021-02-18 NOTE — Patient Instructions (Signed)
Medication Instructions:     Take Metolazone 2.5 mg daily for the next 3 days and then take twice a week thereafter.   *If you need a refill on your cardiac medications before your next appointment, please call your pharmacy*   Lab Work: BMET on 02/25/21  If you have labs (blood work) drawn today and your tests are completely normal, you will receive your results only by: [place] (if you have MyChart) OR A paper copy in the mail If you have any lab test that is abnormal or we need to change your treatment, we will call you to review the results.   Testing/Procedures:  None today      Keep 03/11/21 apt as scheduled     Follow-Up: At [facility], you and your health needs are our priority.  As part of our continuing mission to provide you with exceptional heart care, we have created designated Provider Care Teams.  These Care Teams include your primary Cardiologist (physician) and Advanced Practice Providers (APPs -  Physician Assistants and Nurse Practitioners) who all work together to provide you with the care you need, when you need it.  We recommend signing up for the patient portal called "MyChart".  Sign up information is provided on this After Visit Summary.  MyChart is used to connect with patients for Virtual Visits (Telemedicine).  Patients are able to view lab/test results, encounter notes, upcoming appointments, etc.  Non-urgent messages can be sent to your provider as well.   To learn more about what you can do with MyChart, go to [email or web address].    Your next appointment:

## 2021-02-21 ENCOUNTER — Telehealth: Payer: Self-pay | Admitting: Pulmonary Disease

## 2021-02-21 NOTE — Telephone Encounter (Signed)
Can we please follow up with this patient's CPAP order? See phone note on 6/2 . Sent to [facility] initially & both [facility] & [facility] seem to be OON for pt

## 2021-02-21 NOTE — Telephone Encounter (Signed)
Spoke with pt who states understanding that both [facility] and [facility] are out of her network. Pt states she does not want to go with [facility] if she has to drive to [facility] she needs something from [facility]. PCC's please advise on this.  Routing to Dr. Elsworth Soho as Juluis Rainier

## 2021-02-21 NOTE — Telephone Encounter (Signed)
Spoke to pt.  She states she would like dme in [place].  I explained the dme's in [place] do not take [employer] or are either not taking cpap orders at this time.  I told her [facility] has a [place] and [place] location.  She does not want to use [facility].  I told her I will send order to [facility] - they are in-network with [facility].  She states ok and I gave her their contact info.  Order faxed to [facility].  Nothing further needed.

## 2021-02-22 ENCOUNTER — Telehealth: Payer: Self-pay | Admitting: Pulmonary Disease

## 2021-02-22 NOTE — Telephone Encounter (Signed)
Called and spoke with patient. She stated that she is ok with the [facility].   Called [facility] and spoke with Hersey. She stated that she will go ahead and process the order. She will contact the patient tomorrow.   Nothing further needed.

## 2021-02-22 NOTE — Telephone Encounter (Signed)
Dr.Alva, please advise if [facility] cpap can be used? Wynonia Musty is currently available and resmed will be 2 month wait.  Message routed to Dr. Elsworth Soho

## 2021-02-24 ENCOUNTER — Telehealth: Payer: Self-pay | Admitting: Student

## 2021-02-24 NOTE — Telephone Encounter (Signed)
    Any improvement in the weeping along her legs or dyspnea? She is suppose to be having repeat labs tomorrow (BNP and BMET). Let's await the results of those prior to adjusting her medications further.   Signed, Erma Heritage, PA-C 02/24/2021, 11:03 AM Pager: [phone]

## 2021-02-24 NOTE — Telephone Encounter (Signed)
Lauren Osborn states leg weeping has stopped but dyspnea is the "same". She will have labs done tomorrow before noon.

## 2021-02-24 NOTE — Telephone Encounter (Signed)
New patient   Patient just letting [place]  know her weight loss has not changed at all , she is staying at 290

## 2021-02-25 ENCOUNTER — Telehealth: Payer: Self-pay

## 2021-02-25 ENCOUNTER — Other Ambulatory Visit: Payer: Self-pay

## 2021-02-25 ENCOUNTER — Other Ambulatory Visit (HOSPITAL_COMMUNITY)
Admission: RE | Admit: 2021-02-25 | Discharge: 2021-02-25 | Disposition: A | Discharge status: Home / Self Care | Payer: Managed Care, Other (non HMO) | Source: Ambulatory Visit | Attending: Student | Admitting: Student

## 2021-02-25 DIAGNOSIS — Z79899 Other long term (current) drug therapy: Secondary | ICD-10-CM | POA: Diagnosis not present

## 2021-02-25 DIAGNOSIS — N289 Disorder of kidney and ureter, unspecified: Secondary | ICD-10-CM

## 2021-02-25 LAB — BASIC METABOLIC PANEL
Anion gap: 13 (ref 5–15)
BUN: 39 mg/dL — ABNORMAL HIGH (ref 6–20)
CO2: 25 mmol/L (ref 22–32)
Calcium: 9.2 mg/dL (ref 8.9–10.3)
Chloride: 97 mmol/L — ABNORMAL LOW (ref 98–111)
Creatinine, Ser: 2.19 mg/dL — ABNORMAL HIGH (ref 0.44–1.00)
GFR, Estimated: 27 mL/min — ABNORMAL LOW (ref >60)
Glucose, Bld: 108 mg/dL — ABNORMAL HIGH (ref 70–99)
Potassium: 3.5 mmol/L (ref 3.5–5.1)
Sodium: 135 mmol/L (ref 135–145)

## 2021-02-25 LAB — BRAIN NATRIURETIC PEPTIDE: B Natriuretic Peptide: 524 pg/mL — ABNORMAL HIGH (ref 0.0–100.0)

## 2021-02-25 MED ORDER — TORSEMIDE 20 MG PO TABS: 60.0000 mg | ORAL_TABLET | Freq: Two times a day (BID) | ORAL | 3 refills | Status: DC

## 2021-02-25 NOTE — Telephone Encounter (Signed)
Spoke to patient who voiced agreement with Nephrology referral and medication titration. Lab work orders entered by C. Wilber Oliphant, Therapist, sports. I will update medication list to reflect changes made and send in referral to [facility] per B. Strader, PA-C.

## 2021-02-25 NOTE — Telephone Encounter (Signed)
-----   Message from Erma Heritage, [place] sent at 02/25/2021 11:58 AM EDT ----- Please let the patient know her fluid level remains elevated with BNP at 524 (normal is less than 100) but her kidneys are also stressed with creatinine at 2.19. Potassium stable. Given her fluid overload and no change in weight, would recommend titrating Torsemide to 60mg  BID. Continue with Metolazone twice weekly. Repeat BMET/BNP again in 7-10 days.   If she is in agreement, I would recommend she see a Nephrologist given her variable renal function and to assist with fluid management as her kidney numbers have been consistently elevated. Would refer to [facility] here in [place].

## 2021-02-25 NOTE — Telephone Encounter (Addendum)
-----   Message from Erma Heritage, [place] sent at 02/25/2021 11:58 AM EDT ----- Please let the patient know her fluid level remains elevated with BNP at 524 (normal is less than 100) but her kidneys are also stressed with creatinine at 2.19. Potassium stable. Given her fluid overload and no change in weight, would recommend titrating Torsemide to 60mg  BID. Continue with Metolazone twice weekly. Repeat BMET/BNP again in 7-10 days.   If she is in agreement, I would recommend she see a Nephrologist given her variable renal function and to assist with fluid management as her kidney numbers have been consistently elevated. Would refer to [facility] here in [place].      Patient notified of lab results, torsemide dose increase, need for repeat labs and ref placed to nephrology.

## 2021-03-04 ENCOUNTER — Telehealth: Payer: Self-pay | Admitting: *Deleted

## 2021-03-04 ENCOUNTER — Other Ambulatory Visit (HOSPITAL_COMMUNITY)
Admission: RE | Admit: 2021-03-04 | Discharge: 2021-03-04 | Disposition: A | Discharge status: Home / Self Care | Payer: Managed Care, Other (non HMO) | Source: Ambulatory Visit | Attending: Student | Admitting: Student

## 2021-03-04 ENCOUNTER — Other Ambulatory Visit: Payer: Self-pay

## 2021-03-04 DIAGNOSIS — Z79899 Other long term (current) drug therapy: Secondary | ICD-10-CM | POA: Diagnosis not present

## 2021-03-04 DIAGNOSIS — G4733 Obstructive sleep apnea (adult) (pediatric): Secondary | ICD-10-CM | POA: Diagnosis not present

## 2021-03-04 DIAGNOSIS — E876 Hypokalemia: Secondary | ICD-10-CM

## 2021-03-04 LAB — BASIC METABOLIC PANEL
Anion gap: 11 (ref 5–15)
BUN: 36 mg/dL — ABNORMAL HIGH (ref 6–20)
CO2: 29 mmol/L (ref 22–32)
Calcium: 8.9 mg/dL (ref 8.9–10.3)
Chloride: 95 mmol/L — ABNORMAL LOW (ref 98–111)
Creatinine, Ser: 2.14 mg/dL — ABNORMAL HIGH (ref 0.44–1.00)
GFR, Estimated: 28 mL/min — ABNORMAL LOW (ref >60)
Glucose, Bld: 98 mg/dL (ref 70–99)
Potassium: 2.6 mmol/L — CL (ref 3.5–5.1)
Sodium: 135 mmol/L (ref 135–145)

## 2021-03-04 LAB — BRAIN NATRIURETIC PEPTIDE: B Natriuretic Peptide: 543 pg/mL — ABNORMAL HIGH (ref 0.0–100.0)

## 2021-03-04 NOTE — Telephone Encounter (Signed)
-----   Message from Erma Heritage, [place] sent at 03/04/2021  1:28 PM EDT ----- Please let the patient know her fluid level remains similar to prior values from last week. Kidney function slightly improved with creatinine at 2.14. K+ is significantly low at 2.6.  Would verify how much potassium she is taking and have her take an additional 40 mEq today and tomorrow. BMET on Tuesday for reassessment. Please see where weight has been on her home scales? Would also verify how much Metolazone she has taken this week given her low K+ levels.

## 2021-03-04 NOTE — Telephone Encounter (Signed)
[facility] called with a critical Potassium of 2.6 on patient.

## 2021-03-04 NOTE — Telephone Encounter (Signed)
Pt notified and order placed 

## 2021-03-08 ENCOUNTER — Other Ambulatory Visit: Payer: Self-pay

## 2021-03-08 ENCOUNTER — Other Ambulatory Visit (HOSPITAL_COMMUNITY)
Admission: RE | Admit: 2021-03-08 | Discharge: 2021-03-08 | Disposition: A | Discharge status: Home / Self Care | Payer: Managed Care, Other (non HMO) | Source: Ambulatory Visit | Attending: Student | Admitting: Student

## 2021-03-08 DIAGNOSIS — E876 Hypokalemia: Secondary | ICD-10-CM | POA: Diagnosis not present

## 2021-03-08 LAB — BASIC METABOLIC PANEL
Anion gap: 12 (ref 5–15)
BUN: 35 mg/dL — ABNORMAL HIGH (ref 6–20)
CO2: 26 mmol/L (ref 22–32)
Calcium: 9.6 mg/dL (ref 8.9–10.3)
Chloride: 97 mmol/L — ABNORMAL LOW (ref 98–111)
Creatinine, Ser: 2.08 mg/dL — ABNORMAL HIGH (ref 0.44–1.00)
GFR, Estimated: 29 mL/min — ABNORMAL LOW (ref >60)
Glucose, Bld: 106 mg/dL — ABNORMAL HIGH (ref 70–99)
Potassium: 4.3 mmol/L (ref 3.5–5.1)
Sodium: 135 mmol/L (ref 135–145)

## 2021-03-11 ENCOUNTER — Other Ambulatory Visit: Payer: Self-pay

## 2021-03-11 ENCOUNTER — Ambulatory Visit (INDEPENDENT_AMBULATORY_CARE_PROVIDER_SITE_OTHER): Payer: Managed Care, Other (non HMO) | Admitting: Student

## 2021-03-11 ENCOUNTER — Encounter: Payer: Self-pay | Admitting: Student

## 2021-03-11 VITALS — BP 104/78 | HR 90 | Ht 62.0 in | Wt 307.0 lb

## 2021-03-11 DIAGNOSIS — I10 Essential (primary) hypertension: Secondary | ICD-10-CM | POA: Diagnosis not present

## 2021-03-11 DIAGNOSIS — G4733 Obstructive sleep apnea (adult) (pediatric): Secondary | ICD-10-CM | POA: Diagnosis not present

## 2021-03-11 DIAGNOSIS — I5032 Chronic diastolic (congestive) heart failure: Secondary | ICD-10-CM | POA: Diagnosis not present

## 2021-03-11 DIAGNOSIS — N1832 Chronic kidney disease, stage 3b: Secondary | ICD-10-CM

## 2021-03-11 DIAGNOSIS — K2100 Gastro-esophageal reflux disease with esophagitis, without bleeding: Secondary | ICD-10-CM | POA: Diagnosis not present

## 2021-03-11 DIAGNOSIS — R600 Localized edema: Secondary | ICD-10-CM | POA: Diagnosis not present

## 2021-03-11 NOTE — Patient Instructions (Signed)
Medication Instructions:   Take Metolazone every other day    *If you need a refill on your cardiac medications before your next appointment, please call your pharmacy*   Lab Work: None today  If you have labs (blood work) drawn today and your tests are completely normal, you will receive your results only by: [place] (if you have MyChart) OR A paper copy in the mail If you have any lab test that is abnormal or we need to change your treatment, we will call you to review the results.   Testing/Procedures: Your physician has requested that you have an echocardiogram. Echocardiography is a painless test that uses sound waves to create images of your heart. It provides your doctor with information about the size and shape of your heart and how well your heart's chambers and valves are working. This procedure takes approximately one hour. There are no restrictions for this procedure.     Follow-Up: At [facility], you and your health needs are our priority.  As part of our continuing mission to provide you with exceptional heart care, we have created designated Provider Care Teams.  These Care Teams include your primary Cardiologist (physician) and Advanced Practice Providers (APPs -  Physician Assistants and Nurse Practitioners) who all work together to provide you with the care you need, when you need it.  We recommend signing up for the patient portal called "MyChart".  Sign up information is provided on this After Visit Summary.  MyChart is used to connect with patients for Virtual Visits (Telemedicine).  Patients are able to view lab/test results, encounter notes, upcoming appointments, etc.  Non-urgent messages can be sent to your provider as well.   To learn more about what you can do with MyChart, go to [email or web address].    Your next appointment:   5 week(s)  The format for your next appointment:   In Person  Provider:   You may see Rozann Lesches, MD or  one of the following Advanced Practice Providers on your designated Care Team:   Bernerd Pho, PA-C     Other Instructions None

## 2021-03-11 NOTE — Progress Notes (Signed)
Cardiology Office Note    Date:  03/12/2021   ID:  Lauren Osborn, DOB 03/05/1976, MRN [id number]  PCP:  [name], MD  Cardiologist: Rozann Lesches, MD    Chief Complaint  Patient presents with   Follow-up    2 week visit    History of Present Illness:    Lauren Osborn is a 45 y.o. female  with past medical history of HFpEF, GERD and severe OSA who presents to the office today for 2-week follow-up.  She was examined by myself in 02/2021 and reported worsening lower extremity edema and dyspnea on exertion over the past week and her weight had increased from 278 lbs on 02/07/2021 up to 290 lbs on her home scales. Was instructed to take her Metolazone 2.5 mg for the next 3 days already on Torsemide 40 mg twice daily. She had already been referred to Pulmonology and recent sleep study had shown severe OSA but she had had difficulty in obtaining her CPAP. A message was sent to Pulmonology for follow-up on this.  Follow-up labs showed her creatinine had increased to 2.19 but K+ was stable. It was recommended to try increasing her Torsemide to 60 mg twice daily while remaining on Metolazone twice weekly and she was referred to Nephrology given her variable renal function.  In talking with the patient today, she has experienced worsening dyspnea, edema and weight gain since her last visit with weight now at 307 lbs on the office scales (at 296 - 297 lbs on her home scales). Urination is typically significant when she takes Metolazone but slower on other days. She is trying to limit her fluid intake and does not add extra salt to food or consume frozen foods. She now has her CPAP but is trying to get adjusted to this. Typically only able to use for a few hours at a time currently. No reported orthopnea or PND. No chest pain or palpitations.   Past Medical History:  Diagnosis Date   Diastolic congestive heart failure (HCC)    Essential hypertension    Fibroids    GERD (gastroesophageal  reflux disease)    Gout    Morbid obesity ([place])    Thyroid nodule     Past Surgical History:  Procedure Laterality Date   BIOPSY  08/03/2020   Procedure: BIOPSY;  Surgeon: Eloise Harman, DO;  [facility];  Service: Endoscopy;;  duodenal gastric   CESAREAN SECTION     COLONOSCOPY WITH PROPOFOL N/A 08/03/2020    Surgeon: Hurshel Keys K, DO; 5 mm tubular adenoma in the sigmoid colon, nonbleeding internal hemorrhoids.  Recommended repeat in 5 years.   ESOPHAGOGASTRODUODENOSCOPY (EGD) WITH PROPOFOL N/A 08/03/2020   Surgeon: Eloise Harman, DO; LA grade C esophagitis without bleeding, gastritis with erythema and shallow ulcerations in gastric antrum biopsied (negative for H. pylori), normal examined duodenum s/p biopsy (benign).   HYSTERECTOMY ABDOMINAL WITH SALPINGECTOMY  2008   POLYPECTOMY  08/03/2020   Procedure: POLYPECTOMY;  Surgeon: Eloise Harman, DO;  [facility];  Service: Endoscopy;;  colon   THYROIDECTOMY Right 10/21/2018   Procedure: RIGHT HEMITHYROIDECTOMY;  Surgeon: Leta Baptist, MD;  Location: [facility];  Service: ENT;  Laterality: Right;    Current Medications: Outpatient Medications Prior to Visit  Medication Sig Dispense Refill   acetaminophen (TYLENOL) 500 MG tablet Take 1,000 mg by mouth every 6 (six) hours as needed for moderate pain or headache.     magnesium  oxide (MAG-OX) 400 MG tablet TAKE 1 TABLET BY MOUTH ONCE DAILY. 30 tablet 6   metolazone (ZAROXOLYN) 2.5 MG tablet Take 1 tablet for the next 3 days then twice weekly. 15 tablet 3   omeprazole (PRILOSEC) 20 MG capsule Take 1 capsule (20 mg total) by mouth 2 (two) times daily. 60 capsule 5   potassium chloride SA (KLOR-CON) 20 MEQ tablet Take 40 mEq by mouth 3 (three) times daily. Takes 2 additional tablets by mouth when taking metolazone     torsemide (DEMADEX) 20 MG tablet Take 3 tablets (60 mg total) by mouth 2 (two) times daily. 540 tablet 3   No  facility-administered medications prior to visit.     Allergies:   Oxycodone-acetaminophen   Social History   Socioeconomic History   Marital status: Single    Spouse name: Not on file   Number of children: Not on file   Years of education: Not on file   Highest education level: Not on file  Occupational History   Not on file  Tobacco Use   Smoking status: Former    Packs/day: 0.50    Years: 20.00    Pack years: 10.00    Types: Cigarettes    Quit date: 2017    Years since quitting: 5.5   Smokeless tobacco: Never  Vaping Use   Vaping Use: Never used  Substance and Sexual Activity   Alcohol use: Not Currently   Drug use: Never   Sexual activity: Yes    Comment: hyst  Other Topics Concern   Not on file  Social History Narrative   Not on file   Social Determinants of Health   Financial Resource Strain: Not on file  Food Insecurity: Not on file  Transportation Needs: Not on file  Physical Activity: Not on file  Stress: Not on file  Social Connections: Not on file     Family History:  The patient's family history includes Breast cancer in her mother; Colon cancer (age of onset: 35) in her father; Colon polyps (age of onset: 50) in her sister; Congestive Heart Failure in her maternal grandfather; Diabetes in her daughter; Diverticulitis in her brother and sister; Other in her maternal grandmother and paternal grandfather.   Review of Systems:    Please see the history of present illness.     All other systems reviewed and are otherwise negative except as noted above.   Physical Exam:    VS:  BP 104/78   Pulse 90   Ht 5\' 2"  (1.575 m)   Wt (!) 307 lb (139.3 kg)   SpO2 97%   BMI 56.15 kg/m    General: Pleasant, obese female appearing in no acute distress. Head: Normocephalic, atraumatic. Neck: No carotid bruits. JVD difficult to assess.  Lungs: Respirations regular and unlabored, mild rales along right base.  Heart: Regular rate and rhythm. No S3 or S4.  No  murmur, no rubs, or gallops appreciated. Abdomen: Appears non-distended. No obvious abdominal masses. Msk:  Strength and tone appear normal for age. No obvious joint deformities or effusions. Extremities: No clubbing or cyanosis. 2+ pitting edema.  Distal pedal pulses are 2+ bilaterally. Neuro: Alert and oriented X 3. Moves all extremities spontaneously. No focal deficits noted. Psych:  Responds to questions appropriately with a normal affect. Skin: No rashes or lesions noted  Wt Readings from Last 3 Encounters:  03/11/21 (!) 307 lb (139.3 kg)  02/18/21 290 lb 9.6 oz (131.8 kg)  12/07/20 289 lb (131.1 kg)  Studies/Labs Reviewed:   EKG:  EKG is not ordered today.   Recent Labs: 09/17/2020: TSH 4.037 12/01/2020: ALT 15; Hemoglobin 12.9; Magnesium 2.0; Platelets 147 03/04/2021: B Natriuretic Peptide 543.0 03/08/2021: BUN 35; Creatinine, Ser 2.08; Potassium 4.3; Sodium 135   Lipid Panel No results found for: CHOL, TRIG, HDL, CHOLHDL, VLDL, LDLCALC, LDLDIRECT  Additional studies/ records that were reviewed today include:   Echocardiogram: 09/2020 IMPRESSIONS     1. Left ventricular ejection fraction, by estimation, is 65 to 70%. The  left ventricle has normal function. The left ventricle has no regional  wall motion abnormalities. Left ventricular diastolic parameters were  normal.   2. RV not well visualized. Grossly appears enlarged. Function appears to  be decreased but difficult to quantify. Intermittent septal bounce likely  secondary to RV strain, as there is no echocardiographic signs of  constrictive pericarditis (normal mitral   inflow, normal MV tissue Doppler). . Right ventricular systolic function  was not well visualized. The right ventricular size is not well  visualized.   3. The mitral valve is normal in structure. No evidence of mitral valve  regurgitation. No evidence of mitral stenosis.   4. The aortic valve was not well visualized. Aortic valve regurgitation   is not visualized. No aortic stenosis is present.   Assessment:    1. Chronic diastolic (congestive) heart failure ([place])   2. OSA (obstructive sleep apnea)   3. Stage 3b chronic kidney disease ([place])      Plan:   In order of problems listed above:  1. HFpEF - Medication management has been challenging in the setting of her variable renal function as she responds well to Metolazone yet her renal function worsens with higher or more frequent dosing. She is currently taking Torsemide 60mg  BID and Metolazone 2.5mg  twice weekly. I recommended that she try taking a 1/2 tablet of her Metolazone every other day to spread this out and report back if no improvement. Will keep Torsemide at the same dose for now. She is going on a trip to the beach this weekend and I recommended she wear compression stockings if able while in the car and try to limit sodium and fluid intake.  - Given her continued significant fluid fluctuations, will obtain an updated echo to assess for any interval structural changes. Could consider RHC in the future pending echo results.   2. OSA - She now has a CPAP which will hopefully help with her CHF. Compliance with CPAP was encouraged.   3. Stage 3 CKD - Her creatinine peaked at 2.19 last month, slightly improved to 2.08 on most recent check. She has been referred to Nephrology and has an initial visit scheduled for later this month.    Medication Adjustments/Labs and Tests Ordered: Current medicines are reviewed at length with the patient today.  Concerns regarding medicines are outlined above.  Medication changes, Labs and Tests ordered today are listed in the Patient Instructions below. Patient Instructions  Medication Instructions:   Take Metolazone every other day    *If you need a refill on your cardiac medications before your next appointment, please call your pharmacy*   Lab Work: None today  If you have labs (blood work) drawn today and your tests are  completely normal, you will receive your results only by: [place] (if you have MyChart) OR A paper copy in the mail If you have any lab test that is abnormal or we need to change your treatment, we will call you  to review the results.   Testing/Procedures: Your physician has requested that you have an echocardiogram. Echocardiography is a painless test that uses sound waves to create images of your heart. It provides your doctor with information about the size and shape of your heart and how well your heart's chambers and valves are working. This procedure takes approximately one hour. There are no restrictions for this procedure.     Follow-Up: At [facility], you and your health needs are our priority.  As part of our continuing mission to provide you with exceptional heart care, we have created designated Provider Care Teams.  These Care Teams include your primary Cardiologist (physician) and Advanced Practice Providers (APPs -  Physician Assistants and Nurse Practitioners) who all work together to provide you with the care you need, when you need it.  We recommend signing up for the patient portal called "MyChart".  Sign up information is provided on this After Visit Summary.  MyChart is used to connect with patients for Virtual Visits (Telemedicine).  Patients are able to view lab/test results, encounter notes, upcoming appointments, etc.  Non-urgent messages can be sent to your provider as well.   To learn more about what you can do with MyChart, go to [email or web address].    Your next appointment:   5 week(s)  The format for your next appointment:   In Person  Provider:   You may see Rozann Lesches, MD or one of the following Advanced Practice Providers on your designated Care Team:   Bernerd Pho, PA-C     Other Instructions None      Signed, Erma Heritage, Hershal Coria  03/12/2021 9:12 AM    [facility] [address]  [place], [place] [address] Phone: [phone] Fax: [phone]

## 2021-03-12 ENCOUNTER — Encounter: Payer: Self-pay | Admitting: Student

## 2021-03-12 NOTE — Progress Notes (Deleted)
Referring Provider: [name], MD Primary Care Physician:  [name], MD Primary GI Physician: Dr. Abbey Chatters  No chief complaint on file.   HPI:   Lauren Osborn is a 45 y.o. female presenting today for follow-up of GERD and normocytic anemia.  History of GERD, normocytic anemia noted in June 2021 with iron low at 21, saturation low at 8%, ferritin elevated at 304.  EGD and colonoscopy November 2021 with 5 mm tubular adenomas, due for repeat in 2026.  EGD with LA grade C esophagitis without bleeding, gastritis with erythema and shallow ulceration, gastric antrum (biopsies negative for H. pylori), examined duodenum with benign biopsies.  She was treated with oral iron until the end of March 2022 as her iron panel was within normal limits and hemoglobin had improved to 12.9. Of note, she also has history of CKD with some worsening kidney function overt the last 6+ months due to diuretics in the setting of CHF.   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    Today:  GERD:  Normocytic anemia:    Update labs.   Past Medical History:  Diagnosis Date   Diastolic congestive heart failure (HCC)    Essential hypertension    Fibroids    GERD (gastroesophageal reflux disease)    Gout    Morbid obesity ([place])    Thyroid nodule     Past Surgical History:  Procedure Laterality Date   BIOPSY  08/03/2020   Procedure: BIOPSY;  Surgeon: Eloise Harman, DO;  [facility];  Service: Endoscopy;;  duodenal gastric   CESAREAN SECTION     COLONOSCOPY WITH PROPOFOL N/A 08/03/2020    Surgeon: Hurshel Keys K, DO; 5 mm tubular adenoma in the sigmoid colon, nonbleeding internal hemorrhoids.  Recommended repeat in 5 years.   ESOPHAGOGASTRODUODENOSCOPY (EGD) WITH PROPOFOL N/A 08/03/2020    Surgeon: Eloise Harman, DO; LA grade C esophagitis without bleeding, gastritis with erythema and shallow ulcerations in gastric antrum biopsied (negative for H. pylori), normal examined duodenum s/p biopsy (benign).   HYSTERECTOMY ABDOMINAL WITH SALPINGECTOMY  2008   POLYPECTOMY  08/03/2020   Procedure: POLYPECTOMY;  Surgeon: Eloise Harman, DO;  [facility];  Service: Endoscopy;;  colon   THYROIDECTOMY Right 10/21/2018   Procedure: RIGHT HEMITHYROIDECTOMY;  Surgeon: Leta Baptist, MD;  Location: [facility];  Service: ENT;  Laterality: Right;    Current Outpatient Medications  Medication Sig Dispense Refill   acetaminophen (TYLENOL) 500 MG tablet Take 1,000 mg by mouth every 6 (six) hours as needed for moderate pain or headache.     magnesium oxide (MAG-OX) 400 MG tablet TAKE 1 TABLET BY MOUTH ONCE DAILY. 30 tablet 6   metolazone (ZAROXOLYN) 2.5 MG tablet Take 1 tablet for the next 3 days then twice weekly. 15 tablet 3   omeprazole (PRILOSEC) 20 MG capsule Take 1 capsule (20 mg total) by mouth 2 (two) times daily. 60 capsule 5   potassium chloride SA (KLOR-CON) 20 MEQ tablet Take 40 mEq by mouth 3 (three) times daily. Takes 2 additional tablets by mouth when taking metolazone     torsemide (DEMADEX) 20 MG tablet Take 3 tablets (60 mg total) by mouth 2 (two) times daily. 540 tablet 3   No current facility-administered medications for this visit.  Allergies as of 03/14/2021 - Review Complete 03/12/2021  Allergen Reaction Noted   Oxycodone-acetaminophen Hives and Other (See Comments) 10/11/2018    Family History  Problem Relation Age of Onset   Breast cancer Mother    Other Paternal Grandfather        house fire   Other Maternal Grandmother        house fire   Congestive Heart Failure Maternal Grandfather    Colon cancer Father 19   Diverticulitis Brother    Diverticulitis Sister    Colon polyps Sister 9   Diabetes Daughter        borderline     Social History   Socioeconomic History   Marital status: Single    Spouse name: Not on file   Number of children: Not on file   Years of education: Not on file   Highest education level: Not on file  Occupational History   Not on file  Tobacco Use   Smoking status: Former    Packs/day: 0.50    Years: 20.00    Pack years: 10.00    Types: Cigarettes    Quit date: 2017    Years since quitting: 5.5   Smokeless tobacco: Never  Vaping Use   Vaping Use: Never used  Substance and Sexual Activity   Alcohol use: Not Currently   Drug use: Never   Sexual activity: Yes    Comment: hyst  Other Topics Concern   Not on file  Social History Narrative   Not on file   Social Determinants of Health   Financial Resource Strain: Not on file  Food Insecurity: Not on file  Transportation Needs: Not on file  Physical Activity: Not on file  Stress: Not on file  Social Connections: Not on file    Review of Systems: Gen: Denies fever, chills, anorexia. Denies fatigue, weakness, weight loss.  CV: Denies chest pain, palpitations, syncope, peripheral edema, and claudication. Resp: Denies dyspnea at rest, cough, wheezing, coughing up blood, and pleurisy. GI: Denies vomiting blood, jaundice, and fecal incontinence.   Denies dysphagia or odynophagia. Derm: Denies rash, itching, dry skin Psych: Denies depression, anxiety, memory loss, confusion. No homicidal or suicidal ideation.  Heme: Denies bruising, bleeding, and enlarged lymph nodes.  Physical Exam: There were no vitals taken for this visit. General:   Alert and oriented. No distress noted. Pleasant and cooperative.  Head:  Normocephalic and atraumatic. Eyes:  Conjuctiva clear without scleral icterus. Mouth:  Oral mucosa pink and moist. Good dentition. No lesions. Heart:  S1, S2 present without murmurs appreciated. Lungs:  Clear to auscultation bilaterally. No wheezes, rales, or rhonchi. No distress.  Abdomen:  +BS, soft, non-tender  and non-distended. No rebound or guarding. No HSM or masses noted. Msk:  Symmetrical without gross deformities. Normal posture. Extremities:  Without edema. Neurologic:  Alert and  oriented x4 Psych:  Alert and cooperative. Normal mood and affect.

## 2021-03-14 ENCOUNTER — Ambulatory Visit: Payer: Managed Care, Other (non HMO) | Admitting: Gastroenterology

## 2021-03-14 ENCOUNTER — Encounter: Payer: Self-pay | Admitting: Internal Medicine

## 2021-03-24 ENCOUNTER — Ambulatory Visit (HOSPITAL_COMMUNITY)
Admission: RE | Admit: 2021-03-24 | Discharge: 2021-03-24 | Disposition: A | Discharge status: Home / Self Care | Payer: 59 | Source: Ambulatory Visit | Attending: Student | Admitting: Student

## 2021-03-24 ENCOUNTER — Other Ambulatory Visit: Payer: Self-pay

## 2021-03-24 DIAGNOSIS — I5032 Chronic diastolic (congestive) heart failure: Secondary | ICD-10-CM | POA: Diagnosis not present

## 2021-03-24 LAB — ECHOCARDIOGRAM COMPLETE
Area-P 1/2: 3.65 cm2
S' Lateral: 2.3 cm

## 2021-03-24 NOTE — Progress Notes (Signed)
*  PRELIMINARY RESULTS* Echocardiogram 2D Echocardiogram has been performed.  Lauren Osborn 03/24/2021, 2:53 PM

## 2021-03-25 ENCOUNTER — Other Ambulatory Visit (HOSPITAL_COMMUNITY): Payer: Self-pay | Admitting: Nephrology

## 2021-03-25 DIAGNOSIS — N184 Chronic kidney disease, stage 4 (severe): Secondary | ICD-10-CM

## 2021-03-31 ENCOUNTER — Other Ambulatory Visit: Payer: Self-pay

## 2021-03-31 ENCOUNTER — Ambulatory Visit (HOSPITAL_COMMUNITY)
Admission: RE | Admit: 2021-03-31 | Discharge: 2021-03-31 | Disposition: A | Discharge status: Home / Self Care | Payer: Managed Care, Other (non HMO) | Source: Ambulatory Visit | Attending: Nephrology | Admitting: Nephrology

## 2021-03-31 DIAGNOSIS — N184 Chronic kidney disease, stage 4 (severe): Secondary | ICD-10-CM | POA: Diagnosis present

## 2021-03-31 IMAGING — US US RENAL
1 series · 14 of 25 positions shown · non-contrast
Comparison: None.

CLINICAL DATA: Chronic renal disease

EXAM:
RENAL / URINARY TRACT ULTRASOUND COMPLETE

[Series 1: us renal · 14 of 48 slices shown]
[im 1/48]
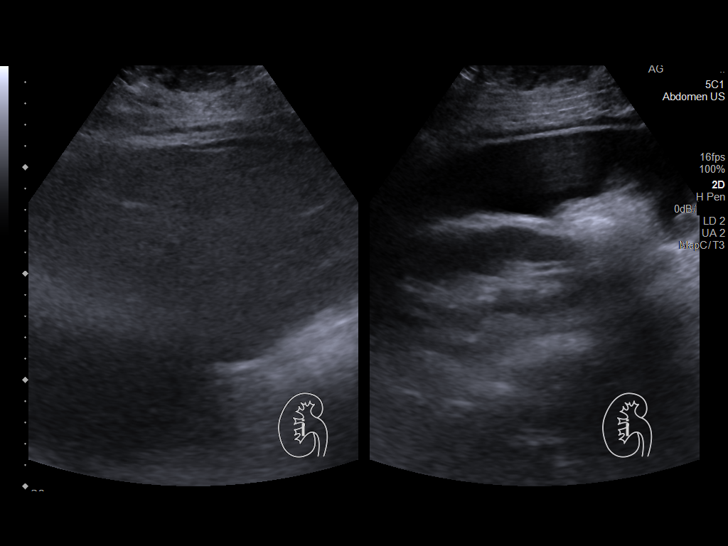
[im 4/48]
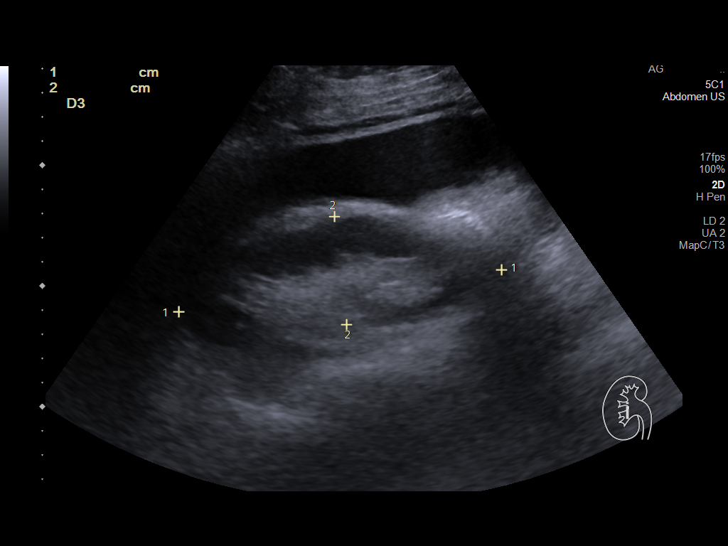
[im 8/48]
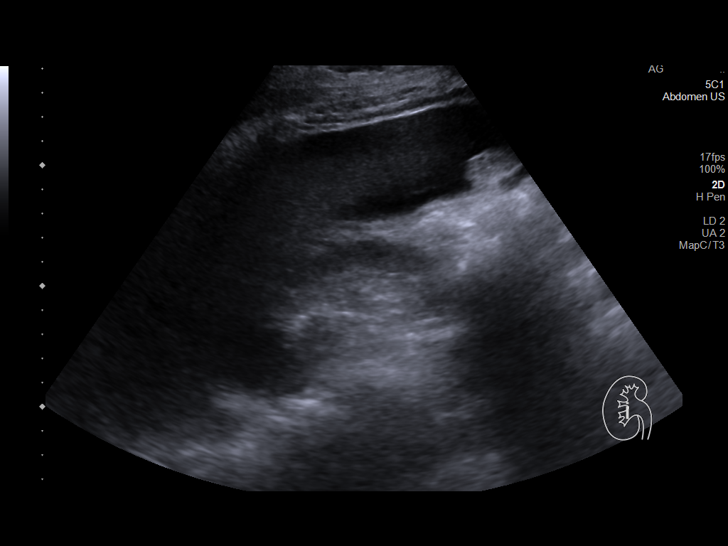
[im 12/48]
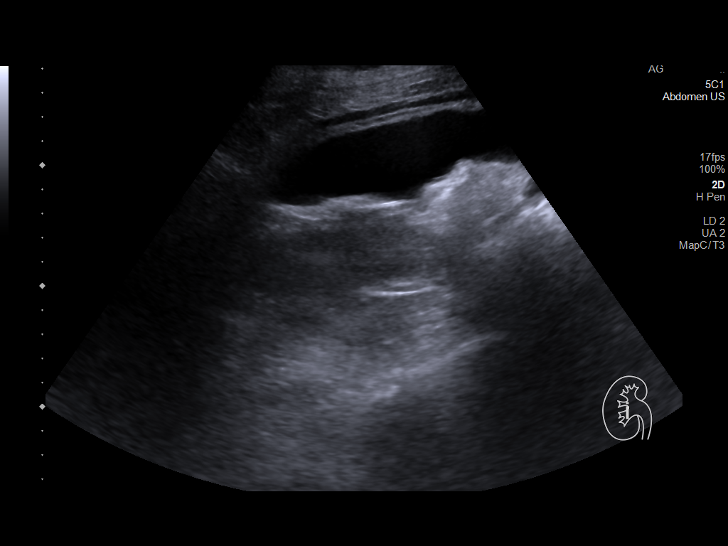
[im 16/48]
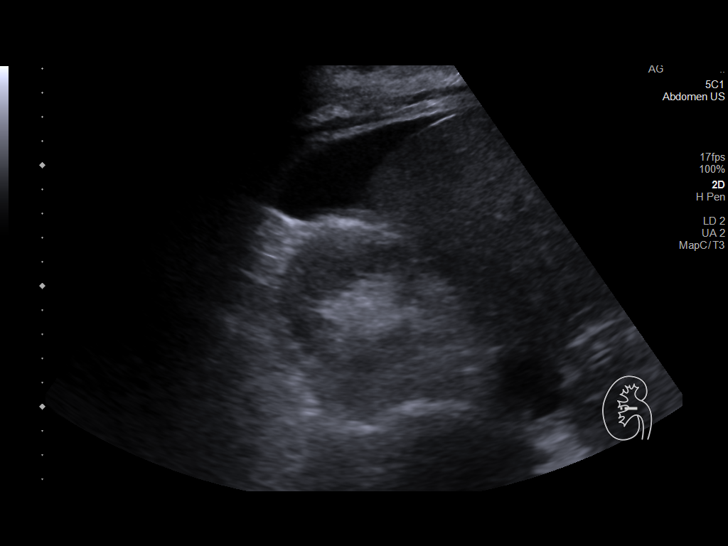
[im 18/48]
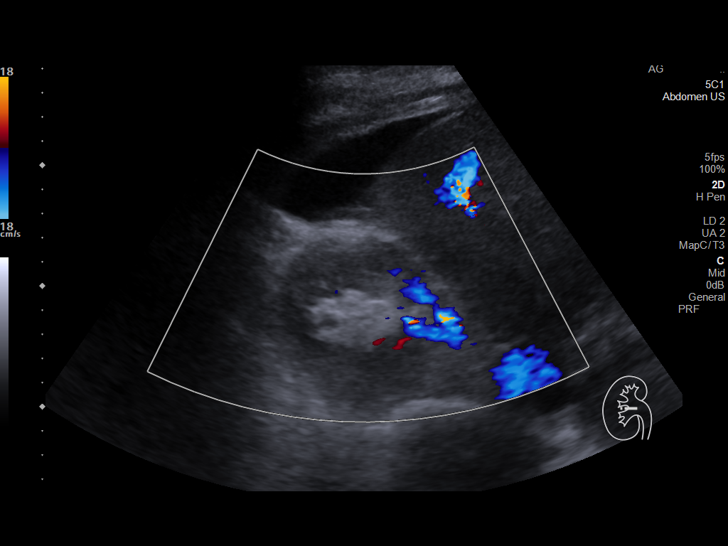
[im 22/48]
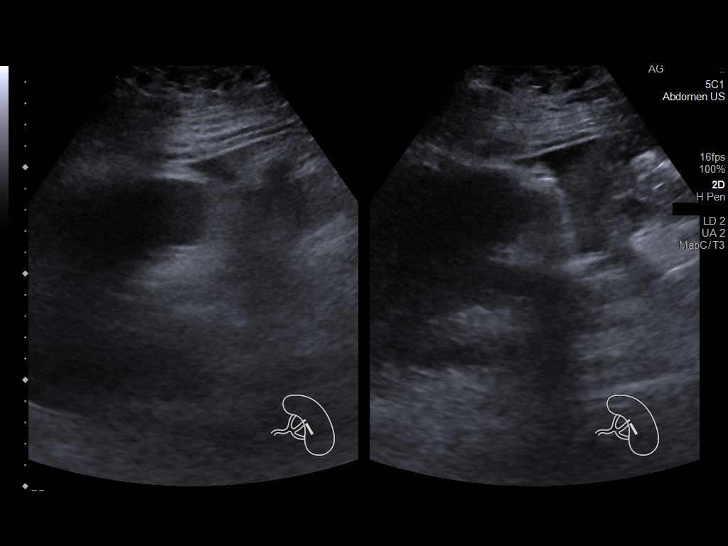
[im 26/48]
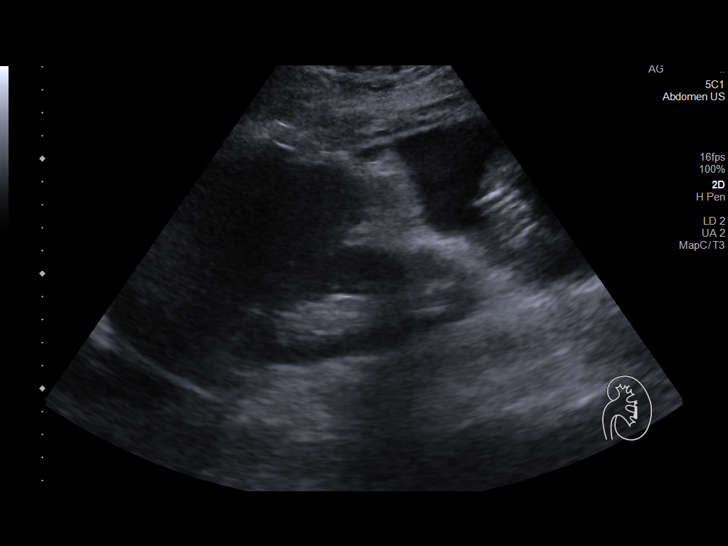
[im 30/48]
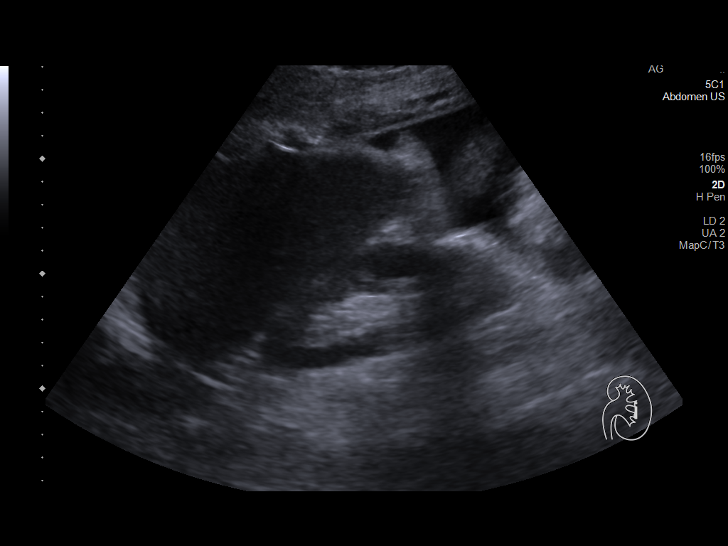
[im 32/48]
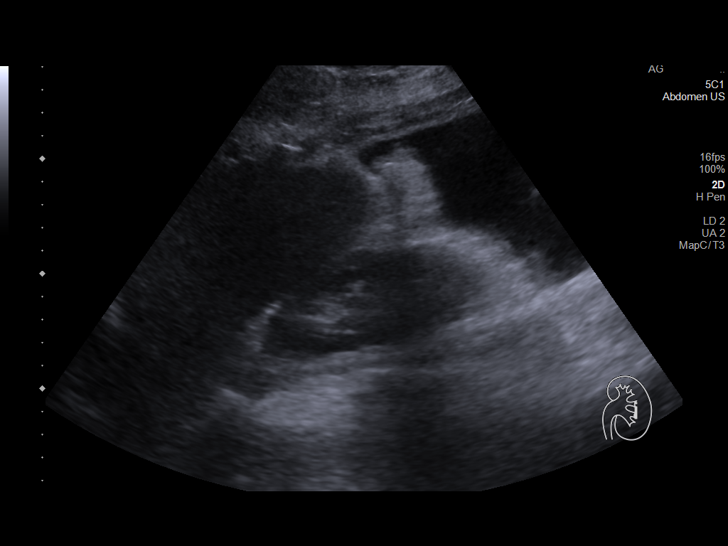
[im 36/48]
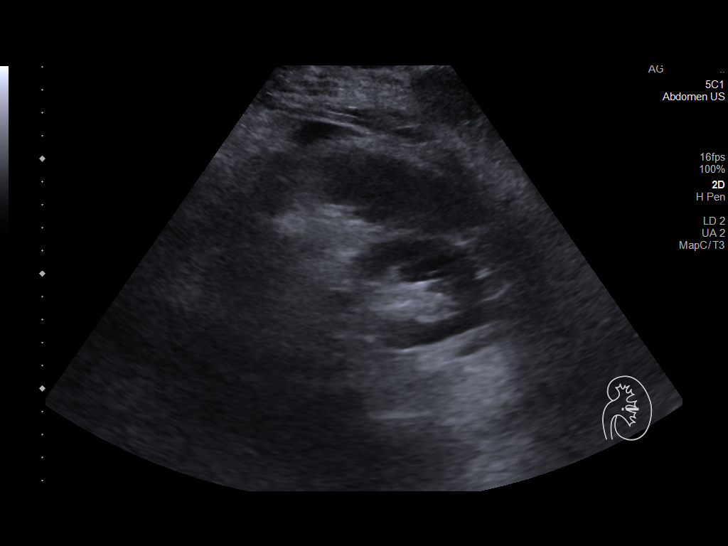
[im 40/48]
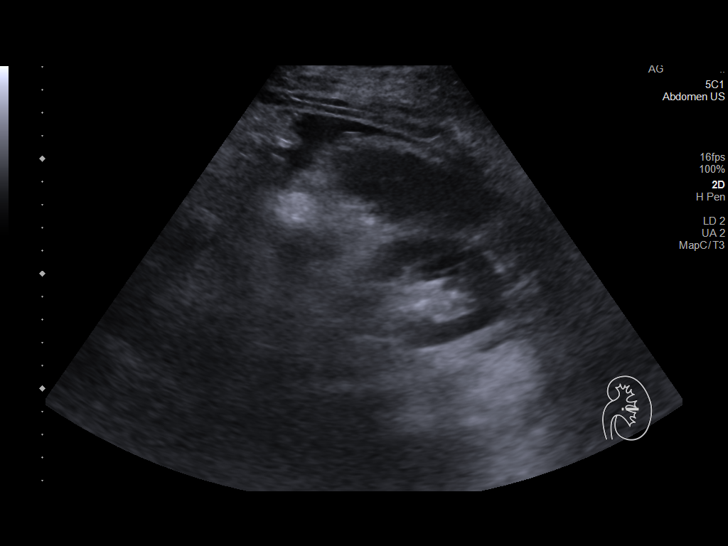
[im 44/48]
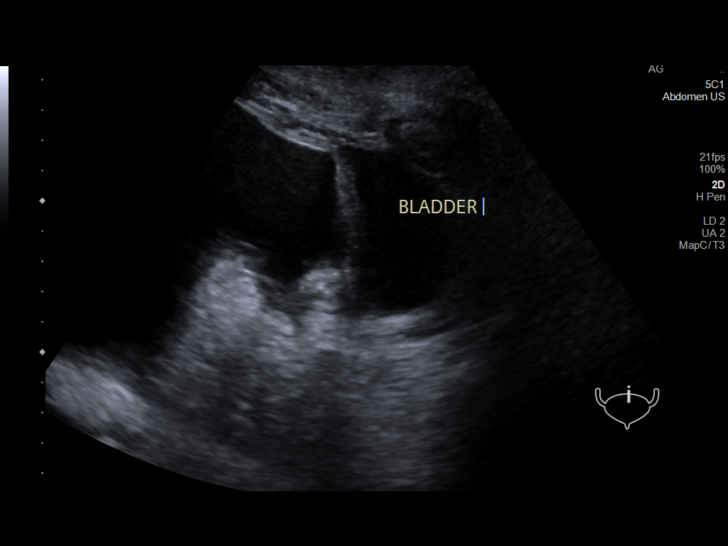
[im 48/48]
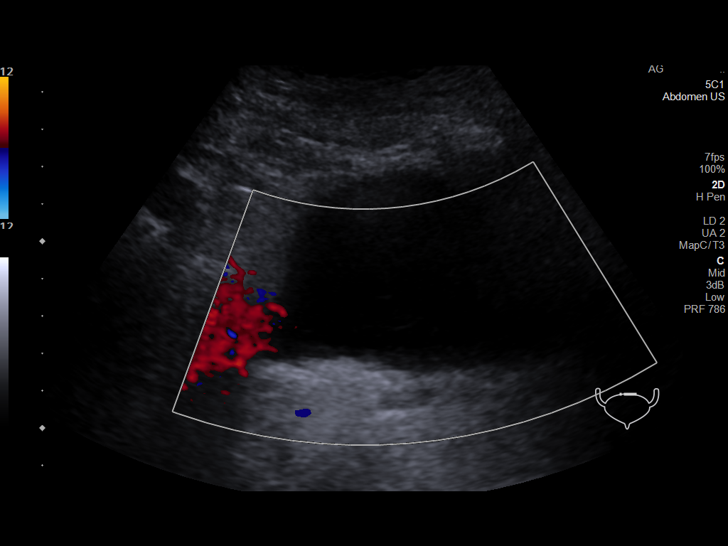

[14 of 25 positions shown; findings below may reference images not displayed]

FINDINGS: Right Kidney:

Renal measurements: 13.5 x 4.5 x 6.6 cm = volume: 209 mL.
Echogenicity within normal limits. No mass or hydronephrosis
visualized.

Left Kidney:

Renal measurements: 12.0 x 5.0 x 5.6 cm = volume: 175 mL.
Echogenicity within normal limits. No mass or hydronephrosis
visualized.

Bladder:

Appears normal for degree of bladder distention.

Other:

Moderate ascites.
IMPRESSION: 1. The kidneys are unremarkable.
2. Moderate ascites.

## 2021-04-04 DIAGNOSIS — G4733 Obstructive sleep apnea (adult) (pediatric): Secondary | ICD-10-CM | POA: Diagnosis not present

## 2021-04-13 ENCOUNTER — Ambulatory Visit (INDEPENDENT_AMBULATORY_CARE_PROVIDER_SITE_OTHER): Payer: Medicaid Other | Admitting: Student

## 2021-04-13 ENCOUNTER — Encounter: Payer: Self-pay | Admitting: Student

## 2021-04-13 ENCOUNTER — Other Ambulatory Visit: Payer: Self-pay

## 2021-04-13 VITALS — BP 138/80 | HR 88 | Ht 63.0 in | Wt 297.0 lb

## 2021-04-13 DIAGNOSIS — I5032 Chronic diastolic (congestive) heart failure: Secondary | ICD-10-CM | POA: Diagnosis not present

## 2021-04-13 DIAGNOSIS — G4733 Obstructive sleep apnea (adult) (pediatric): Secondary | ICD-10-CM | POA: Diagnosis not present

## 2021-04-13 DIAGNOSIS — N1832 Chronic kidney disease, stage 3b: Secondary | ICD-10-CM

## 2021-04-13 NOTE — Patient Instructions (Signed)
Medication Instructions:  Your physician recommends that you continue on your current medications as directed. Please refer to the Current Medication list given to you today.  *If you need a refill on your cardiac medications before your next appointment, please call your pharmacy*   Lab Work: None today  If you have labs (blood work) drawn today and your tests are completely normal, you will receive your results only by: MyChart Message (if you have MyChart) OR A paper copy in the mail If you have any lab test that is abnormal or we need to change your treatment, we will call you to review the results.   Testing/Procedures: None today    Follow-Up: At [facility], you and your health needs are our priority.  As part of our continuing mission to provide you with exceptional heart care, we have created designated Provider Care Teams.  These Care Teams include your primary Cardiologist (physician) and Advanced Practice Providers (APPs -  Physician Assistants and Nurse Practitioners) who all work together to provide you with the care you need, when you need it.  We recommend signing up for the patient portal called "MyChart".  Sign up information is provided on this After Visit Summary.  MyChart is used to connect with patients for Virtual Visits (Telemedicine).  Patients are able to view lab/test results, encounter notes, upcoming appointments, etc.  Non-urgent messages can be sent to your provider as well.   To learn more about what you can do with MyChart, go to [email or web address].    Your next appointment:   3 month(s)  The format for your next appointment:   In Person  Provider:   You may see Samuel McDowell, MD or one of the following Advanced Practice Providers on your designated Care Team:   Brittany Strader, PA-C     Other Instructions None   

## 2021-04-13 NOTE — Progress Notes (Signed)
Cardiology Office Note    Date:  04/13/2021   ID:  Lauren Osborn, DOB 12-16-1975, MRN [id number]  PCP:  [name], MD  Cardiologist: Rozann Lesches, MD    Chief Complaint  Patient presents with   Follow-up    1 month visit    History of Present Illness:    Lauren Osborn is a 45 y.o. female with past medical history of HFpEF, GERD and severe OSA who presents to the office today for 4-week follow-up.  She was last examined by myself in 03/2021 reporting worsening dyspnea, edema and weight gain since her last visit with weight at 307 lbs on the office scales. She was trying to limit her fluid and sodium intake. She also had a CPAP at that time but was trying to get adjusted to this and was only able to use intermittently. She was continued on Torsemide 60 mg twice daily and Metolazone but it was recommended that she take a half a tablet of her Metolazone every other day to spread this out given that she did not experience much urination without Metolazone. She had also been referred to Nephrology. Repeat echocardiogram was obtained and showed her EF remained preserved at 60 to 65% with no regional wall motion normalities. She did have flattening along the interventricular septum during diastole which was consistent with right ventricular volume overload but her RV function was normal. She also had normal PASP and no significant valve abnormalities.  By review of Care Everywhere, she did see Dr. Theador Hawthorne in 03/2021 with plans for further work-up of her CKD plan at that time along with renal ultrasound.  Renal ultrasound was performed on 04/02/2021 with no acute findings. Was noted to have moderate ascites.  In talking with the patient today, she continues to have dyspnea on exertion and lower extremity edema which has been unchanged. Her weight has declined by 10 lbs since her last office visit and she continues to experience good urination. Says her baseline weight was in the 270's to  280's last year. Denies any recent orthopnea, PND, chest pain or palpitations. She is now able to tolerate her CPAP for up to 4 hours. Continues to try to limit her sodium and fluid intake.   Past Medical History:  Diagnosis Date   Diastolic congestive heart failure (HCC)    Essential hypertension    Fibroids    GERD (gastroesophageal reflux disease)    Gout    Morbid obesity ([place])    Thyroid nodule     Past Surgical History:  Procedure Laterality Date   BIOPSY  08/03/2020   Procedure: BIOPSY;  Surgeon: Eloise Harman, DO;  [facility];  Service: Endoscopy;;  duodenal gastric   CESAREAN SECTION     COLONOSCOPY WITH PROPOFOL N/A 08/03/2020    Surgeon: Hurshel Keys K, DO; 5 mm tubular adenoma in the sigmoid colon, nonbleeding internal hemorrhoids.  Recommended repeat in 5 years.   ESOPHAGOGASTRODUODENOSCOPY (EGD) WITH PROPOFOL N/A 08/03/2020   Surgeon: Eloise Harman, DO; LA grade C esophagitis without bleeding, gastritis with erythema and shallow ulcerations in gastric antrum biopsied (negative for H. pylori), normal examined duodenum s/p biopsy (benign).   HYSTERECTOMY ABDOMINAL WITH SALPINGECTOMY  2008   POLYPECTOMY  08/03/2020   Procedure: POLYPECTOMY;  Surgeon: Eloise Harman, DO;  [facility];  Service: Endoscopy;;  colon   THYROIDECTOMY Right 10/21/2018   Procedure: RIGHT HEMITHYROIDECTOMY;  Surgeon: Leta Baptist, MD;  Location: [facility];  Service: ENT;  Laterality: Right;    Current Medications: Outpatient Medications Prior to Visit  Medication Sig Dispense Refill   magnesium oxide (MAG-OX) 400 MG tablet TAKE 1 TABLET BY MOUTH ONCE DAILY. 30 tablet 6   omeprazole (PRILOSEC) 20 MG capsule Take 1 capsule (20 mg total) by mouth 2 (two) times daily. 60 capsule 5   potassium chloride SA (KLOR-CON) 20 MEQ tablet Take 40 mEq by mouth 3 (three) times daily. Takes 2 additional tablets by mouth when taking metolazone     torsemide  (DEMADEX) 20 MG tablet Take 3 tablets (60 mg total) by mouth 2 (two) times daily. 540 tablet 3   acetaminophen (TYLENOL) 500 MG tablet Take 1,000 mg by mouth every 6 (six) hours as needed for moderate pain or headache. (Patient not taking: Reported on 04/13/2021)     metolazone (ZAROXOLYN) 2.5 MG tablet Take 1 tablet for the next 3 days then twice weekly. (Patient taking differently: 1/2 tablet M W F) 15 tablet 3   No facility-administered medications prior to visit.     Allergies:   Oxycodone-acetaminophen   Social History   Socioeconomic History   Marital status: Single    Spouse name: Not on file   Number of children: Not on file   Years of education: Not on file   Highest education level: Not on file  Occupational History   Not on file  Tobacco Use   Smoking status: Former    Packs/day: 0.50    Years: 20.00    Pack years: 10.00    Types: Cigarettes    Quit date: 2017    Years since quitting: 5.6   Smokeless tobacco: Never  Vaping Use   Vaping Use: Never used  Substance and Sexual Activity   Alcohol use: Not Currently   Drug use: Never   Sexual activity: Yes    Comment: hyst  Other Topics Concern   Not on file  Social History Narrative   Not on file   Social Determinants of Health   Financial Resource Strain: Not on file  Food Insecurity: Not on file  Transportation Needs: Not on file  Physical Activity: Not on file  Stress: Not on file  Social Connections: Not on file     Family History:  The patient's family history includes Breast cancer in her mother; Colon cancer (age of onset: 38) in her father; Colon polyps (age of onset: 34) in her sister; Congestive Heart Failure in her maternal grandfather; Diabetes in her daughter; Diverticulitis in her brother and sister; Other in her maternal grandmother and paternal grandfather.   Review of Systems:    Please see the history of present illness.     All other systems reviewed and are otherwise negative except as  noted above.   Physical Exam:    VS:  BP 138/80   Pulse 88   Ht 5\' 3"  (1.6 m)   Wt 297 lb (134.7 kg)   SpO2 98%   BMI 52.61 kg/m    General: Pleasant, obese female appearing in no acute distress. Head: Normocephalic, atraumatic. Neck: No carotid bruits. JVD not elevated.  Lungs: Respirations regular and unlabored, without wheezes or rales.  Heart: Regular rate and rhythm. No S3 or S4.  No murmur, no rubs, or gallops appreciated. Abdomen: Appears distended.  Msk:  Strength and tone appear normal for age. No obvious joint deformities or effusions. Extremities: No clubbing or cyanosis. 1+ pitting edema bilaterally.  Distal pedal pulses are 2+ bilaterally. Neuro:  Alert and oriented X 3. Moves all extremities spontaneously. No focal deficits noted. Psych:  Responds to questions appropriately with a normal affect. Skin: No rashes or lesions noted  Wt Readings from Last 3 Encounters:  04/13/21 297 lb (134.7 kg)  03/11/21 (!) 307 lb (139.3 kg)  02/18/21 290 lb 9.6 oz (131.8 kg)     Studies/Labs Reviewed:   EKG:  EKG is not ordered today.    Recent Labs: 09/17/2020: TSH 4.037 12/01/2020: ALT 15; Hemoglobin 12.9; Magnesium 2.0; Platelets 147 03/04/2021: B Natriuretic Peptide 543.0 03/08/2021: BUN 35; Creatinine, Ser 2.08; Potassium 4.3; Sodium 135   Lipid Panel No results found for: CHOL, TRIG, HDL, CHOLHDL, VLDL, LDLCALC, LDLDIRECT  Additional studies/ records that were reviewed today include:   Echocardiogram: 03/2021 IMPRESSIONS     1. Left ventricular ejection fraction, by estimation, is 60 to 65%. The  left ventricle has normal function. The left ventricle has no regional  wall motion abnormalities. Left ventricular diastolic parameters were  normal. There is the interventricular  septum is flattened in diastole ('D' shaped left ventricle), consistent  with right ventricular volume overload.   2. Right ventricular systolic function is normal. The right ventricular  size  is mildly enlarged. There is normal pulmonary artery systolic  pressure. The estimated right ventricular systolic pressure is 76.1 mmHg.   3. Left atrial size was upper normal.   4. The mitral valve is grossly normal. Trivial mitral valve  regurgitation.   5. The aortic valve is tricuspid. Aortic valve regurgitation is not  visualized.   6. The inferior vena cava is normal in size with <50% respiratory  variability, suggesting right atrial pressure of 8 mmHg.   Assessment:    1. Chronic diastolic (congestive) heart failure (HCC)   2. Stage 3b chronic kidney disease ([place])   3. OSA (obstructive sleep apnea)      Plan:   In order of problems listed above:  1. HFpEF - Recent echo showed a preserved EF at 60 to 65% with no regional wall motion normalities. She did have flattening along the interventricular septum during diastole which was consistent with right ventricular volume overload but her RV function and PASP were normal. I will review with Dr. Domenic Polite upon his return to see if additional testing would be indicated such as RHC. Not an ideal LHC candidate given her CKD and high-risk for contrast induced nephropathy. Continue with CPAP as outlined below.  - Will continue Torsemide 60mg  BID along with 1/2 of a Metolazone tablet every other day. The importance of limiting sodium and fluid intake was again reviewed with the patient and she is being very mindful of this and reading nutrition labels.   2. Stage 3 CKD - Her creatinine was at 2.08 on most recent check. She is now being followed by Dr. Theador Hawthorne and recent Renal US was unrevealing. She did have repeat labs earlier this week and I will request a copy.   Addendum: Repeat labs from Nephrology show her creatinine has improved to 1.85 but K+ was low at 3.0. I communicated this with the patient and she does believe she missed a dose of K-dur earlier this week. I recommended she take an extra 40 mEq today then resume her routine dosing.  Has follow-up with Nephrology in 2 weeks and will need a repeat BMET at that time if not obtained in the interim.   3. OSA - Continued compliance with CPAP was encouraged as untreated OSA is likely driving her volume  overload.    Medication Adjustments/Labs and Tests Ordered: Current medicines are reviewed at length with the patient today.  Concerns regarding medicines are outlined above.  Medication changes, Labs and Tests ordered today are listed in the Patient Instructions below. Patient Instructions  Medication Instructions:  Your physician recommends that you continue on your current medications as directed. Please refer to the Current Medication list given to you today.  *If you need a refill on your cardiac medications before your next appointment, please call your pharmacy*   Lab Work: None today  If you have labs (blood work) drawn today and your tests are completely normal, you will receive your results only by: [place] (if you have MyChart) OR A paper copy in the mail If you have any lab test that is abnormal or we need to change your treatment, we will call you to review the results.   Testing/Procedures: None today    Follow-Up: At [facility], you and your health needs are our priority.  As part of our continuing mission to provide you with exceptional heart care, we have created designated Provider Care Teams.  These Care Teams include your primary Cardiologist (physician) and Advanced Practice Providers (APPs -  Physician Assistants and Nurse Practitioners) who all work together to provide you with the care you need, when you need it.  We recommend signing up for the patient portal called "MyChart".  Sign up information is provided on this After Visit Summary.  MyChart is used to connect with patients for Virtual Visits (Telemedicine).  Patients are able to view lab/test results, encounter notes, upcoming appointments, etc.  Non-urgent messages can be sent  to your provider as well.   To learn more about what you can do with MyChart, go to [email or web address].    Your next appointment:   3 month(s)  The format for your next appointment:   In Person  Provider:   You may see Rozann Lesches, MD or one of the following Advanced Practice Providers on your designated Care Team:   Bernerd Pho, PA-C     Other Instructions None     Signed, Erma Heritage, Hershal Coria  04/13/2021 8:19 PM    [facility] [address] [place], [place] [address] Phone: [phone] Fax: [phone]

## 2021-04-19 ENCOUNTER — Other Ambulatory Visit: Payer: Self-pay

## 2021-04-19 ENCOUNTER — Encounter: Payer: Self-pay | Admitting: Pulmonary Disease

## 2021-04-19 ENCOUNTER — Ambulatory Visit (INDEPENDENT_AMBULATORY_CARE_PROVIDER_SITE_OTHER): Payer: Managed Care, Other (non HMO) | Admitting: Pulmonary Disease

## 2021-04-19 DIAGNOSIS — Z6841 Body Mass Index (BMI) 40.0 and over, adult: Secondary | ICD-10-CM | POA: Diagnosis not present

## 2021-04-19 DIAGNOSIS — G4733 Obstructive sleep apnea (adult) (pediatric): Secondary | ICD-10-CM | POA: Diagnosis not present

## 2021-04-19 NOTE — Assessment & Plan Note (Signed)
Weight loss advised. She seems to be at her dry weight of 291 pounds

## 2021-04-19 NOTE — Assessment & Plan Note (Signed)
She has severe OSA.  CPAP download was reviewed today which shows good control of events with average CPAP of 8.5 cm.  Compliance is between 4.5 and 5.5 hours She is having adjustment issues which would not be abnormal for her first month but overall seems to be benefiting from CPAP use, decreased somnolence and increased energy We also discussed cardiovascular benefits of CPAP  Weight loss encouraged, compliance with goal of at least 4-6 hrs every night is the expectation. Advised against medications with sedative side effects Cautioned against driving when sleepy - understanding that sleepiness will vary on a day to day basis

## 2021-04-19 NOTE — Patient Instructions (Signed)
CPAP is working well on current settings Goal is 6h Lauren Osborn

## 2021-04-19 NOTE — Progress Notes (Signed)
   Subjective:    Patient ID: Lauren Osborn, female    DOB: [birth year], 45 y.o.   MRN: [id number]  HPI  45 year old morbidly obese woman for follow-up of OSA PMH -chronic diastolic heart failure  She was diagnosed with severe OSA and finally obtained a Luna CPAP from [facility].  She is on a full facemask, trying to use it 4 to 5 hours every night.  She reports slight increase in energy but is having some adjustment issues.  Weight is maintained between 219 to 96 pounds, 291 today. She goes to work between 6 AM and 2 PM so wakes up around 5 AM. The milligrams of torsemide twice daily and metolazone thrice a week and complains of nocturia   Significant tests/ events reviewed   Admitted 09/2020 for CHF, wt 358 >> 329 lbs.  12/2020 NPSG>> severe OSA, AHI 39/h   Review of Systems neg for any significant sore throat, dysphagia, itching, sneezing, nasal congestion or excess/ purulent secretions, fever, chills, sweats, unintended wt loss, pleuritic or exertional cp, hempoptysis, orthopnea pnd or change in chronic leg swelling. Also denies presyncope, palpitations, heartburn, abdominal pain, nausea, vomiting, diarrhea or change in bowel or urinary habits, dysuria,hematuria, rash, arthralgias, visual complaints, headache, numbness weakness or ataxia.     Objective:   Physical Exam  Gen. Pleasant, obese, in no distress ENT - no lesions, no post nasal drip Neck: No JVD, no thyromegaly, no carotid bruits Lungs: no use of accessory muscles, no dullness to percussion, decreased without rales or rhonchi  Cardiovascular: Rhythm regular, heart sounds  normal, no murmurs or gallops, no peripheral edema Musculoskeletal: No deformities, no cyanosis or clubbing , no tremors       Assessment & Plan:

## 2021-04-29 ENCOUNTER — Telehealth: Payer: Self-pay | Admitting: Student

## 2021-04-29 NOTE — Telephone Encounter (Signed)
   I reviewed the patient's case with Dr. Domenic Polite after her most recent visit and he said we could consider referral to the Advanced Heart Failure clinic for consideration of a right heart catheterization but not sure that this would necessarily be recommended given her echocardiogram did not suggest pulmonary hypertension. Would not be a candidate for a LHC given her renal insufficiency.  I called the patient today to review these recommendations and she reports she was recently started on Spironolactone by Nephrology for ascites. She wishes to try this first and see how her symptoms respond. We reviewed that if her fluid continues to fluctuate or renal function does not allow for titration of diuretic therapy, can consider referral to AHF in the future.  Signed, Erma Heritage, PA-C 04/29/2021, 12:56 PM

## 2021-05-05 DIAGNOSIS — G4733 Obstructive sleep apnea (adult) (pediatric): Secondary | ICD-10-CM | POA: Diagnosis not present

## 2021-05-11 ENCOUNTER — Other Ambulatory Visit (HOSPITAL_COMMUNITY)
Admission: RE | Admit: 2021-05-11 | Discharge: 2021-05-11 | Disposition: A | Discharge status: Home / Self Care | Payer: Managed Care, Other (non HMO) | Source: Ambulatory Visit | Attending: Nephrology | Admitting: Nephrology

## 2021-05-11 DIAGNOSIS — D472 Monoclonal gammopathy: Secondary | ICD-10-CM | POA: Diagnosis present

## 2021-05-11 DIAGNOSIS — E876 Hypokalemia: Secondary | ICD-10-CM | POA: Diagnosis present

## 2021-05-11 DIAGNOSIS — I5032 Chronic diastolic (congestive) heart failure: Secondary | ICD-10-CM | POA: Diagnosis present

## 2021-05-11 DIAGNOSIS — E211 Secondary hyperparathyroidism, not elsewhere classified: Secondary | ICD-10-CM | POA: Insufficient documentation

## 2021-05-11 DIAGNOSIS — N184 Chronic kidney disease, stage 4 (severe): Secondary | ICD-10-CM | POA: Diagnosis not present

## 2021-05-11 LAB — POTASSIUM: Potassium: 3.6 mmol/L (ref 3.5–5.1)

## 2021-05-30 ENCOUNTER — Telehealth: Payer: Self-pay | Admitting: Student

## 2021-05-30 NOTE — Telephone Encounter (Signed)
   Please obtain some additional information --> productive cough? sick contacts? weight gain (says weight loss in the notes)? worsening edema? Would also confirm recent diuretic dosing and usage. This would help to differentiate cardiac vs. pulmonary etiologies.   Signed, Erma Heritage, PA-C 05/30/2021, 2:42 PM

## 2021-05-30 NOTE — Telephone Encounter (Signed)
New message     Pt c/o Shortness Of Breath: STAT if SOB developed within the last 24 hours or pt is noticeably SOB on the phone  1. Are you currently SOB (can you hear that pt is SOB on the phone)?  Yes - also has hoarse voice, coughing -cold symptoms  2. How long have you been experiencing SOB? 1 week   3. Are you SOB when sitting or when up moving around? both  4. Are you currently experiencing any other symptoms? Weight loss she is at 285 this morning

## 2021-05-30 NOTE — Telephone Encounter (Signed)
I will forward to B.Ahmed Prima, PA-C for review.

## 2021-05-31 NOTE — Telephone Encounter (Signed)
Lauren Osborn's weight is 287 #, no cough, no sick contacts, edema unchanged-no worse, no different.Taking Torsemide 60 mg BID, Metolazone 2.5 mg om M-W-F, nephrologist added spironolactone 12.5 mg daily a couple weeks ago she said. Her voice was very hoarse when I spoke with her.

## 2021-05-31 NOTE — Telephone Encounter (Signed)
    If her weight is down and no worsening fluid build-up, I think her symptoms are less likely to be cardiac related. If hoarseness if her main symptom, the most common cause is usually laryngitis which can be caused by a virus or allergies. Unlikely to be bacterial so no role for antibiotics unless symptoms persist. I would recommend she consume warm liquids and use a humidifier if able. Vocal rest is also helpful. If symptoms persist or worsen, would recommend she contact her PCP or go to Urgent Care.   Signed, Erma Heritage, PA-C 05/31/2021, 8:04 AM

## 2021-05-31 NOTE — Telephone Encounter (Signed)
I spoke with Lauren Osborn and she will drink warm fluids, use a vaporizer and monitor. She will go to urgent care if needed.

## 2021-06-04 DIAGNOSIS — G4733 Obstructive sleep apnea (adult) (pediatric): Secondary | ICD-10-CM | POA: Diagnosis not present

## 2021-06-07 DIAGNOSIS — G4733 Obstructive sleep apnea (adult) (pediatric): Secondary | ICD-10-CM | POA: Diagnosis not present

## 2021-06-23 ENCOUNTER — Emergency Department (HOSPITAL_COMMUNITY): Payer: Managed Care, Other (non HMO)

## 2021-06-23 ENCOUNTER — Other Ambulatory Visit (HOSPITAL_COMMUNITY)
Admission: RE | Admit: 2021-06-23 | Discharge: 2021-06-23 | Disposition: A | Discharge status: Home / Self Care | Payer: Managed Care, Other (non HMO) | Source: Ambulatory Visit | Attending: Nephrology | Admitting: Nephrology

## 2021-06-23 ENCOUNTER — Other Ambulatory Visit: Payer: Self-pay

## 2021-06-23 ENCOUNTER — Inpatient Hospital Stay (HOSPITAL_COMMUNITY)
Admission: EM | Admit: 2021-06-23 | Discharge: 2021-06-28 | DRG: 291 | Disposition: A | Discharge status: Home / Self Care | Payer: Managed Care, Other (non HMO) | Attending: Internal Medicine | Admitting: Internal Medicine

## 2021-06-23 ENCOUNTER — Encounter (HOSPITAL_COMMUNITY): Payer: Self-pay | Admitting: *Deleted

## 2021-06-23 DIAGNOSIS — Z803 Family history of malignant neoplasm of breast: Secondary | ICD-10-CM | POA: Diagnosis not present

## 2021-06-23 DIAGNOSIS — K219 Gastro-esophageal reflux disease without esophagitis: Secondary | ICD-10-CM | POA: Diagnosis present

## 2021-06-23 DIAGNOSIS — N1832 Chronic kidney disease, stage 3b: Secondary | ICD-10-CM | POA: Diagnosis not present

## 2021-06-23 DIAGNOSIS — E876 Hypokalemia: Secondary | ICD-10-CM | POA: Insufficient documentation

## 2021-06-23 DIAGNOSIS — Z9079 Acquired absence of other genital organ(s): Secondary | ICD-10-CM | POA: Diagnosis not present

## 2021-06-23 DIAGNOSIS — R601 Generalized edema: Secondary | ICD-10-CM

## 2021-06-23 DIAGNOSIS — N184 Chronic kidney disease, stage 4 (severe): Secondary | ICD-10-CM | POA: Diagnosis not present

## 2021-06-23 DIAGNOSIS — D472 Monoclonal gammopathy: Secondary | ICD-10-CM | POA: Insufficient documentation

## 2021-06-23 DIAGNOSIS — R9431 Abnormal electrocardiogram [ECG] [EKG]: Secondary | ICD-10-CM | POA: Diagnosis present

## 2021-06-23 DIAGNOSIS — Z833 Family history of diabetes mellitus: Secondary | ICD-10-CM

## 2021-06-23 DIAGNOSIS — G4733 Obstructive sleep apnea (adult) (pediatric): Secondary | ICD-10-CM | POA: Diagnosis not present

## 2021-06-23 DIAGNOSIS — Z8249 Family history of ischemic heart disease and other diseases of the circulatory system: Secondary | ICD-10-CM

## 2021-06-23 DIAGNOSIS — I5032 Chronic diastolic (congestive) heart failure: Secondary | ICD-10-CM | POA: Insufficient documentation

## 2021-06-23 DIAGNOSIS — Z20822 Contact with and (suspected) exposure to covid-19: Secondary | ICD-10-CM | POA: Diagnosis present

## 2021-06-23 DIAGNOSIS — M109 Gout, unspecified: Secondary | ICD-10-CM | POA: Diagnosis present

## 2021-06-23 DIAGNOSIS — Z79899 Other long term (current) drug therapy: Secondary | ICD-10-CM | POA: Diagnosis not present

## 2021-06-23 DIAGNOSIS — Z91119 Patient's noncompliance with dietary regimen due to unspecified reason: Secondary | ICD-10-CM

## 2021-06-23 DIAGNOSIS — Z87891 Personal history of nicotine dependence: Secondary | ICD-10-CM | POA: Diagnosis not present

## 2021-06-23 DIAGNOSIS — Z6841 Body Mass Index (BMI) 40.0 and over, adult: Secondary | ICD-10-CM | POA: Diagnosis not present

## 2021-06-23 DIAGNOSIS — I5033 Acute on chronic diastolic (congestive) heart failure: Secondary | ICD-10-CM | POA: Diagnosis not present

## 2021-06-23 DIAGNOSIS — Z885 Allergy status to narcotic agent status: Secondary | ICD-10-CM | POA: Diagnosis not present

## 2021-06-23 DIAGNOSIS — E890 Postprocedural hypothyroidism: Secondary | ICD-10-CM | POA: Diagnosis present

## 2021-06-23 DIAGNOSIS — I130 Hypertensive heart and chronic kidney disease with heart failure and stage 1 through stage 4 chronic kidney disease, or unspecified chronic kidney disease: Secondary | ICD-10-CM | POA: Diagnosis present

## 2021-06-23 DIAGNOSIS — N179 Acute kidney failure, unspecified: Secondary | ICD-10-CM | POA: Diagnosis not present

## 2021-06-23 DIAGNOSIS — R161 Splenomegaly, not elsewhere classified: Secondary | ICD-10-CM | POA: Diagnosis present

## 2021-06-23 DIAGNOSIS — E211 Secondary hyperparathyroidism, not elsewhere classified: Secondary | ICD-10-CM | POA: Insufficient documentation

## 2021-06-23 DIAGNOSIS — R0602 Shortness of breath: Secondary | ICD-10-CM | POA: Diagnosis not present

## 2021-06-23 LAB — COMPREHENSIVE METABOLIC PANEL
ALT: 17 U/L (ref 0–44)
AST: 38 U/L (ref 15–41)
Albumin: 3.7 g/dL (ref 3.5–5.0)
Alkaline Phosphatase: 94 U/L (ref 38–126)
Anion gap: 15 (ref 5–15)
BUN: 32 mg/dL — ABNORMAL HIGH (ref 6–20)
CO2: 27 mmol/L (ref 22–32)
Calcium: 8.7 mg/dL — ABNORMAL LOW (ref 8.9–10.3)
Chloride: 93 mmol/L — ABNORMAL LOW (ref 98–111)
Creatinine, Ser: 1.88 mg/dL — ABNORMAL HIGH (ref 0.44–1.00)
GFR, Estimated: 33 mL/min — ABNORMAL LOW (ref >60)
Glucose, Bld: 93 mg/dL (ref 70–99)
Potassium: 3 mmol/L — ABNORMAL LOW (ref 3.5–5.1)
Sodium: 135 mmol/L (ref 135–145)
Total Bilirubin: 2.4 mg/dL — ABNORMAL HIGH (ref 0.3–1.2)
Total Protein: 7.1 g/dL (ref 6.5–8.1)

## 2021-06-23 LAB — URINALYSIS, ROUTINE W REFLEX MICROSCOPIC
Bilirubin Urine: NEGATIVE
Glucose, UA: NEGATIVE mg/dL
Hgb urine dipstick: NEGATIVE
Ketones, ur: NEGATIVE mg/dL
Leukocytes,Ua: NEGATIVE
Nitrite: NEGATIVE
Protein, ur: NEGATIVE mg/dL
Specific Gravity, Urine: 1.006 (ref 1.005–1.030)
pH: 7 (ref 5.0–8.0)

## 2021-06-23 LAB — CBC
HCT: 37.4 % (ref 36.0–46.0)
Hemoglobin: 12.2 g/dL (ref 12.0–15.0)
MCH: 31.3 pg (ref 26.0–34.0)
MCHC: 32.6 g/dL (ref 30.0–36.0)
MCV: 95.9 fL (ref 80.0–100.0)
Platelets: 260 10*3/uL (ref 150–400)
RBC: 3.9 MIL/uL (ref 3.87–5.11)
RDW: 15.9 % — ABNORMAL HIGH (ref 11.5–15.5)
WBC: 6.6 10*3/uL (ref 4.0–10.5)
nRBC: 0 % (ref 0.0–0.2)

## 2021-06-23 LAB — BRAIN NATRIURETIC PEPTIDE: B Natriuretic Peptide: 357 pg/mL — ABNORMAL HIGH (ref 0.0–100.0)

## 2021-06-23 LAB — RESP PANEL BY RT-PCR (FLU A&B, COVID) ARPGX2
Influenza A by PCR: NEGATIVE
Influenza B by PCR: NEGATIVE
SARS Coronavirus 2 by RT PCR: NEGATIVE

## 2021-06-23 LAB — RENAL FUNCTION PANEL
Albumin: 3.7 g/dL (ref 3.5–5.0)
Anion gap: 10 (ref 5–15)
BUN: 31 mg/dL — ABNORMAL HIGH (ref 6–20)
CO2: 28 mmol/L (ref 22–32)
Calcium: 9 mg/dL (ref 8.9–10.3)
Chloride: 93 mmol/L — ABNORMAL LOW (ref 98–111)
Creatinine, Ser: 1.89 mg/dL — ABNORMAL HIGH (ref 0.44–1.00)
GFR, Estimated: 33 mL/min — ABNORMAL LOW (ref >60)
Glucose, Bld: 106 mg/dL — ABNORMAL HIGH (ref 70–99)
Phosphorus: 3.6 mg/dL (ref 2.5–4.6)
Potassium: 3.2 mmol/L — ABNORMAL LOW (ref 3.5–5.1)
Sodium: 131 mmol/L — ABNORMAL LOW (ref 135–145)

## 2021-06-23 IMAGING — DX DG CHEST 2V
2 series · 2 of 2 positions shown · non-contrast
Comparison: [DATE]

CLINICAL DATA: This of breath for 2 weeks, history of prior tobacco
use

EXAM:
CHEST - 2 VIEW

[chest pa]
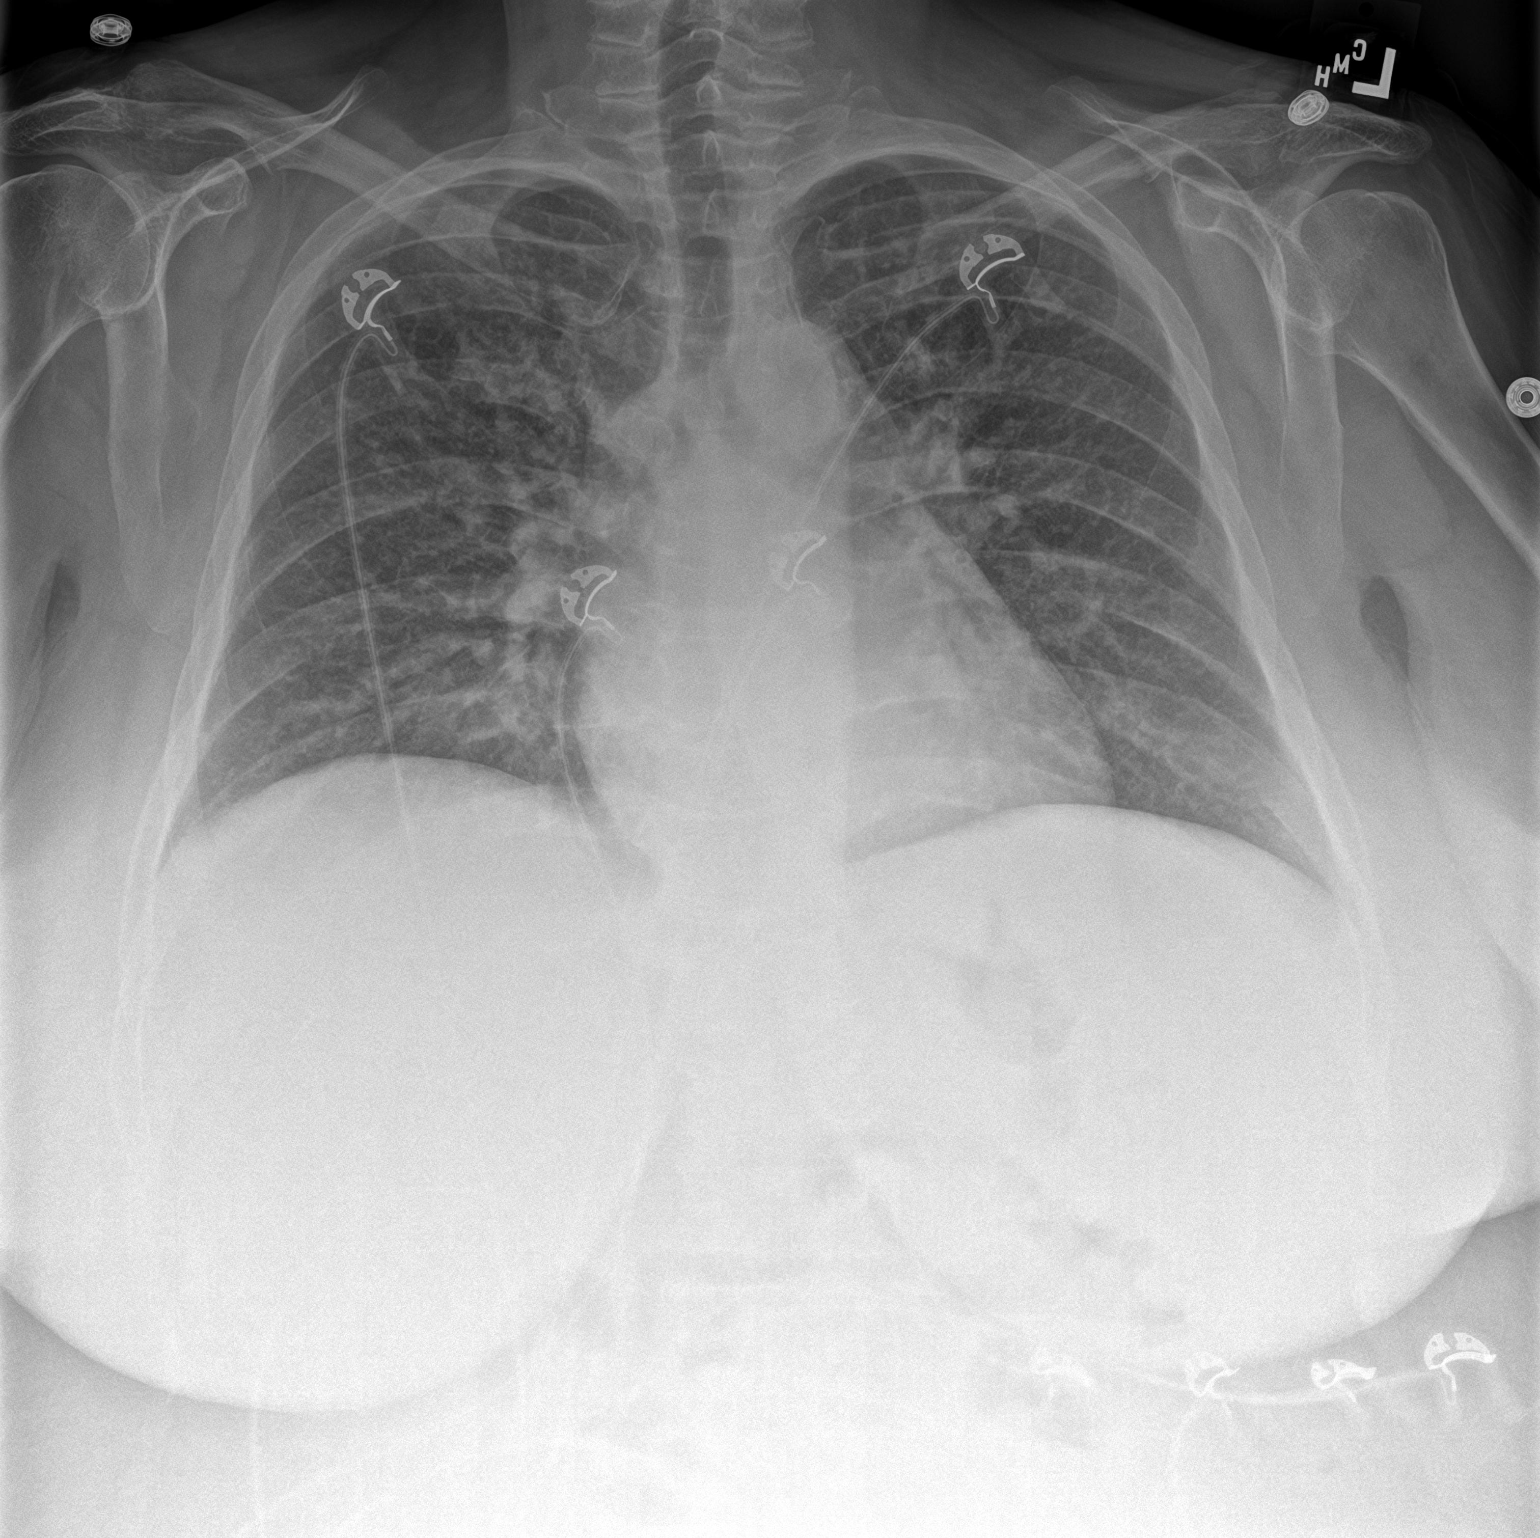

[chest lat]
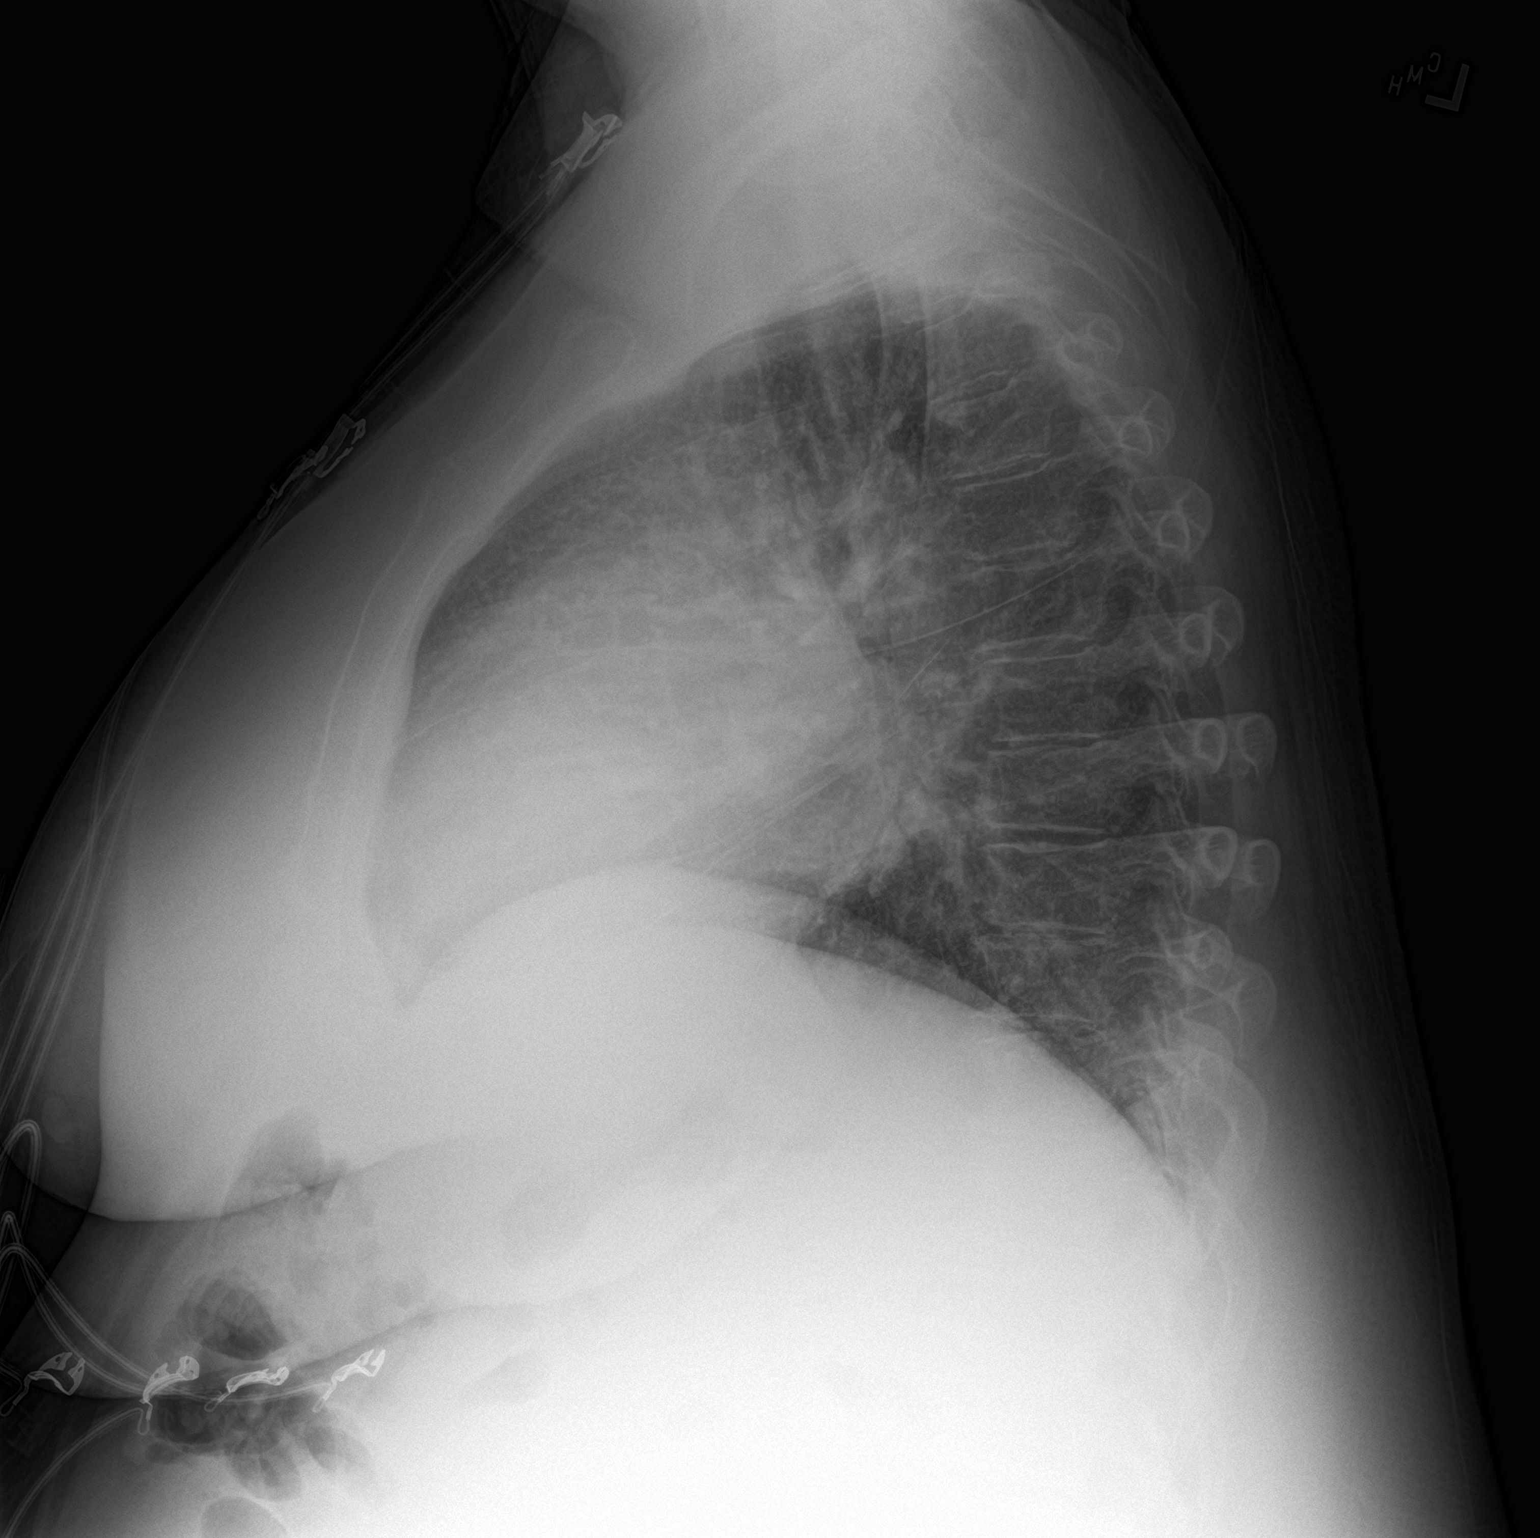

[2 of 2 positions shown; findings below may reference images not displayed]

FINDINGS: Cardiac shadow is within normal limits. Mild vascular prominence is
noted with mild interstitial edema similar to that seen on the prior
exam. No focal infiltrate or effusion is noted. No bony abnormality
is seen.
IMPRESSION: Changes of mild pulmonary edema.

## 2021-06-23 MED ORDER — POLYETHYLENE GLYCOL 3350 17 G PO PACK: 17.0000 g | PACK | Freq: Every day | ORAL | Status: DC | PRN

## 2021-06-23 MED ORDER — PANTOPRAZOLE SODIUM 40 MG PO TBEC
40.0000 mg | DELAYED_RELEASE_TABLET | Freq: Every day | ORAL | Status: DC
  Administered 2021-06-24 – 2021-06-28 (×5): 40 mg via ORAL
  Filled 2021-06-23 (×5): qty 1

## 2021-06-23 MED ORDER — FUROSEMIDE 10 MG/ML IJ SOLN
80.0000 mg | INTRAMUSCULAR | Status: AC
  Administered 2021-06-23: 80 mg via INTRAVENOUS
  Filled 2021-06-23: qty 8

## 2021-06-23 MED ORDER — POTASSIUM CHLORIDE CRYS ER 20 MEQ PO TBCR
40.0000 meq | EXTENDED_RELEASE_TABLET | ORAL | Status: AC
Start: 2021-06-23 — End: 2021-06-24
  Administered 2021-06-24 (×2): 40 meq via ORAL
  Filled 2021-06-23: qty 2
  Filled 2021-06-23: qty 4

## 2021-06-23 MED ORDER — FUROSEMIDE 10 MG/ML IJ SOLN
60.0000 mg | Freq: Two times a day (BID) | INTRAMUSCULAR | Status: DC
  Administered 2021-06-24 – 2021-06-25 (×3): 60 mg via INTRAVENOUS
  Filled 2021-06-23 (×3): qty 6

## 2021-06-23 MED ORDER — POTASSIUM CHLORIDE CRYS ER 20 MEQ PO TBCR
40.0000 meq | EXTENDED_RELEASE_TABLET | Freq: Every day | ORAL | Status: DC
  Administered 2021-06-24 – 2021-06-25 (×2): 40 meq via ORAL
  Filled 2021-06-23 (×3): qty 2
  Filled 2021-06-23: qty 4

## 2021-06-23 MED ORDER — ACETAMINOPHEN 325 MG PO TABS
650.0000 mg | ORAL_TABLET | Freq: Four times a day (QID) | ORAL | Status: DC | PRN
  Filled 2021-06-23: qty 2

## 2021-06-23 MED ORDER — POTASSIUM CHLORIDE CRYS ER 20 MEQ PO TBCR: 40.0000 meq | EXTENDED_RELEASE_TABLET | Freq: Once | ORAL | Status: DC

## 2021-06-23 MED ORDER — SPIRONOLACTONE 12.5 MG HALF TABLET
12.5000 mg | ORAL_TABLET | Freq: Every day | ORAL | Status: DC
  Administered 2021-06-24: 12.5 mg via ORAL
  Filled 2021-06-23 (×4): qty 1

## 2021-06-23 MED ORDER — ACETAMINOPHEN 650 MG RE SUPP: 650.0000 mg | Freq: Four times a day (QID) | RECTAL | Status: DC | PRN

## 2021-06-23 MED ORDER — MAGNESIUM SULFATE 2 GM/50ML IV SOLN
2.0000 g | Freq: Once | INTRAVENOUS | Status: AC
  Administered 2021-06-24: 2 g via INTRAVENOUS
  Filled 2021-06-23: qty 50

## 2021-06-23 MED ORDER — ENOXAPARIN SODIUM 80 MG/0.8ML IJ SOSY
70.0000 mg | PREFILLED_SYRINGE | INTRAMUSCULAR | Status: DC
  Administered 2021-06-24 – 2021-06-28 (×5): 70 mg via SUBCUTANEOUS
  Filled 2021-06-23 (×5): qty 0.8

## 2021-06-23 NOTE — H&P (Signed)
History and Physical    Lauren Osborn:[id number] DOB: [birth year] DOA: 06/23/2021  PCP: [name], MD   Patient coming from: Home  I have personally briefly reviewed patient's old medical records in [place]  Chief Complaint: Difficulty breathing, generalized swellling  HPI: Lauren Osborn is a 45 y.o. female with medical history significant for CHF, OSA, hypertension, gout, obesity. Patient presented to the ED with complaints of difficulty breathing, fluid retention of several weeks.  She reports gradual increasing swelling involving her legs up to her thighs abdomen up to her chest.  She reports at night her breasts are swollen also.  Her abdomen feels tight.  Difficulty breathing is present with exertion, And improves with rest.  No chest pain.  She reports compliance with her torsemide 60 mg  twice every day.  Reports no salt intake.  ED Course: Stable vitals.  Creatinine 1.89.  Potassium 3.2.  Sodium 131.  BNP 357.  Chest x-ray with mild pulmonary edema.  EKG sinus rhythm low voltage.  80 mg Lasix x1 given.  Hospitalist to admit.  Review of Systems: As per HPI all other systems reviewed and negative.  Past Medical History:  Diagnosis Date   Diastolic congestive heart failure (HCC)    Essential hypertension    Fibroids    GERD (gastroesophageal reflux disease)    Gout    Morbid obesity ([place])    Thyroid nodule     Past Surgical History:  Procedure Laterality Date   BIOPSY  08/03/2020   Procedure: BIOPSY;  Surgeon: Eloise Harman, DO;  [facility];  Service: Endoscopy;;  duodenal gastric   CESAREAN SECTION     COLONOSCOPY WITH PROPOFOL N/A 08/03/2020    Surgeon: Hurshel Keys K, DO; 5 mm tubular adenoma in the sigmoid colon, nonbleeding internal hemorrhoids.  Recommended repeat in 5 years.   ESOPHAGOGASTRODUODENOSCOPY (EGD) WITH PROPOFOL N/A 08/03/2020   Surgeon: Eloise Harman, DO; LA grade C esophagitis without bleeding, gastritis with  erythema and shallow ulcerations in gastric antrum biopsied (negative for H. pylori), normal examined duodenum s/p biopsy (benign).   HYSTERECTOMY ABDOMINAL WITH SALPINGECTOMY  2008   POLYPECTOMY  08/03/2020   Procedure: POLYPECTOMY;  Surgeon: Eloise Harman, DO;  [facility];  Service: Endoscopy;;  colon   THYROIDECTOMY Right 10/21/2018   Procedure: RIGHT HEMITHYROIDECTOMY;  Surgeon: Leta Baptist, MD;  Location: [place];  Service: ENT;  Laterality: Right;     reports that she quit smoking about 5 years ago. Her smoking use included cigarettes. She has a 10.00 pack-year smoking history. She has never used smokeless tobacco. She reports that she does not currently use alcohol. She reports that she does not use drugs.  Allergies  Allergen Reactions   Oxycodone-Acetaminophen Hives and Other (See Comments)    Family History  Problem Relation Age of Onset   Breast cancer Mother    Other Paternal Grandfather        house fire   Other Maternal Grandmother        house fire   Congestive Heart Failure Maternal Grandfather    Colon cancer Father 77   Diverticulitis Brother    Diverticulitis Sister    Colon polyps Sister 66   Diabetes Daughter        borderline    Prior to Admission medications   Medication Sig Start Date End Date Taking? Authorizing Provider  acetaminophen (TYLENOL) 500 MG tablet Take 1,000 mg by mouth every  6 (six) hours as needed for moderate pain or headache.   Yes [provider]  magnesium oxide (MAG-OX) 400 MG tablet TAKE 1 TABLET BY MOUTH ONCE DAILY. 01/17/21  Yes Satira Sark, MD  metolazone (ZAROXOLYN) 2.5 MG tablet Take 1 tablet for the next 3 days then twice weekly. Patient taking differently: Take 2.5 mg by mouth See admin instructions. 1/2 tablet M W F 02/18/21  Yes Strader, Fransisco Hertz, PA-C  omeprazole (PRILOSEC) 20 MG capsule Take 1 capsule (20 mg total) by mouth 2 (two) times daily. Patient taking differently: Take  20 mg by mouth daily. 08/03/20 06/23/21 Yes Carver, Elon Alas, DO  potassium chloride SA (KLOR-CON) 20 MEQ tablet Take 40 mEq by mouth 3 (three) times daily. Takes 2 additional tablets by mouth when taking metolazone   Yes [provider]  spironolactone (ALDACTONE) 25 MG tablet Take 12.5 mg by mouth daily. 06/22/21  Yes [provider]  torsemide (DEMADEX) 20 MG tablet Take 3 tablets (60 mg total) by mouth 2 (two) times daily. 02/25/21 06/23/21 Yes Strader, Fransisco Hertz, PA-C  magnesium oxide (MAG-OX) 400 MG tablet Take by mouth. Patient not taking: No sig reported 01/17/21   [provider]  omeprazole (PRILOSEC) 40 MG capsule Take 40 mg by mouth daily. Patient not taking: No sig reported 06/15/21   [provider]    Physical Exam: Vitals:   06/23/21 1930 06/23/21 2000 06/23/21 2030 06/23/21 2100  BP: 102/81 (!) 116/91 111/85 107/81  Pulse: 87 95 95 94  Resp: 15 (!) 25 (!) 27 14  Temp:      TempSrc:      SpO2: 100% 100% 100% 99%  Weight:      Height:        Constitutional: obese, calm, comfortable Vitals:   06/23/21 1930 06/23/21 2000 06/23/21 2030 06/23/21 2100  BP: 102/81 (!) 116/91 111/85 107/81  Pulse: 87 95 95 94  Resp: 15 (!) 25 (!) 27 14  Temp:      TempSrc:      SpO2: 100% 100% 100% 99%  Weight:      Height:       Eyes: PERRL, lids and conjunctivae normal ENMT: Mucous membranes are moist. Posterior pharynx clear of any exudate or lesions.Normal dentition.  Neck: normal, supple, no masses, no thyromegaly Respiratory: She is lying almost flat in bed and appears comfortable, on room air, does not appear dyspneic, Normal respiratory effort. No accessory muscle use.  Cardiovascular: Regular rate and rhythm, 3+ pitting extremity edema up her thighs, 2+ pedal pulses. No carotid bruits.  Abdomen: Abdomen full distended, tight, no tenderness, no masses palpated. No hepatosplenomegaly. Bowel sounds positive.  Musculoskeletal: no clubbing /  cyanosis. No joint deformity upper and lower extremities. Good ROM, no contractures. Normal muscle tone.  Skin: no rashes, lesions, ulcers. No induration Neurologic: No apparent cranial nerve abnormality, moving extremities spontaneously.  Psychiatric: Normal judgment and insight. Alert and oriented x 3. Normal mood.   Labs on Admission: I have personally reviewed following labs and imaging studies  CBC: Recent Labs  Lab 06/23/21 1337  WBC 6.6  HGB 12.2  HCT 37.4  MCV 95.9  PLT 053   Basic Metabolic Panel: Recent Labs  Lab 06/23/21 1337 06/23/21 1807  NA 131* 135  K 3.2* 3.0*  CL 93* 93*  CO2 28 27  GLUCOSE 106* 93  BUN 31* 32*  CREATININE 1.89* 1.88*  CALCIUM 9.0 8.7*  PHOS 3.6  --  Liver Function Tests: Recent Labs  Lab 06/23/21 1337 06/23/21 1807  AST  --  38  ALT  --  17  ALKPHOS  --  94  BILITOT  --  2.4*  PROT  --  7.1  ALBUMIN 3.7 3.7   Urine analysis:    Component Value Date/Time   COLORURINE YELLOW 06/23/2021 1950   APPEARANCEUR CLEAR 06/23/2021 1950   LABSPEC 1.006 06/23/2021 1950   PHURINE 7.0 06/23/2021 1950   GLUCOSEU NEGATIVE 06/23/2021 1950   HGBUR NEGATIVE 06/23/2021 1950   BILIRUBINUR NEGATIVE 06/23/2021 1950   KETONESUR NEGATIVE 06/23/2021 1950   PROTEINUR NEGATIVE 06/23/2021 1950   UROBILINOGEN 0.2 04/05/2007 1141   NITRITE NEGATIVE 06/23/2021 1950   LEUKOCYTESUR NEGATIVE 06/23/2021 1950    Radiological Exams on Admission: DG Chest 2 View  Result Date: 06/23/2021 CLINICAL DATA:  This of breath for 2 weeks, history of prior tobacco use EXAM: CHEST - 2 VIEW COMPARISON:  09/20/2020 FINDINGS: Cardiac shadow is within normal limits. Mild vascular prominence is noted with mild interstitial edema similar to that seen on the prior exam. No focal infiltrate or effusion is noted. No bony abnormality is seen. IMPRESSION: Changes of mild pulmonary edema. Electronically Signed   By: Inez Catalina M.D.   On: 06/23/2021 19:11    EKG:  Independently reviewed.  Sinus rhythm rate 93.  QTc 500.  Low voltage.  Assessment/Plan Principal Problem:   Anasarca Active Problems:   Hypokalemia   Prolonged QT interval   Acute on chronic heart failure with preserved ejection fraction (HFpEF) (HCC)   Morbid obesity with BMI of 50.0-59.9, adult (HCC)   OSA (obstructive sleep apnea)    Anasarca/acute on chronic diastolic heart [id number] pitting edema to legs, with swelling involving thighs, to her abdomen.  Reports breast swelling also, at night.  BNP 357, prior elevations to 500s.  Follows with Cardiology PA Dr. Ahmed Prima. - Last echo 03/2021 EF 60 to 65%, with evidence of right ventricular volume overload.   - Reports compliance with torsemide 60 twice daily, and low-salt intake.  Also on metolazone 2.5 mg 3 times a week.   -Per chart over the past 2 months, since August, patient has gained 14 pounds of weight. -IV Lasix 80 mg x 1 given, continue 60 twice daily -Monitor renal function closely, BMP daily -Strict input output, daily weights -Resume torsemide  Hypokalemia-3.  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 -Monitor closely with diuresis  Prolonged QTC- 500.  Not new, intermittently prolonged up to 530 in the recent past. -Repeat electrolytes  OSA - CPAP QHS  Morbid obesity-BMI 54.  DVT prophylaxis: Lovenox Code Status: Full code Family Communication: None at bedside Disposition Plan: ~> 2 days Consults called: none Admission status: Inpt, tele  I certify that at the point of admission it is my clinical judgment that the patient will require inpatient hospital care spanning beyond 2 midnights from the point of admission due to high intensity of service, high risk for further deterioration and high frequency of surveillance required.   Bethena Roys MD [facility]  06/23/2021, 11:27 PM

## 2021-06-23 NOTE — ED Triage Notes (Signed)
Shortness of breath with fluid retention for weeks, referred by PCP

## 2021-06-23 NOTE — ED Provider Notes (Signed)
[facility] EMERGENCY DEPARTMENT Provider Note   CSN: [id number] Arrival date & time: 06/23/21  1624     History Chief Complaint  Patient presents with   Shortness of Breath    Lauren Osborn is a 45 y.o. female.   Shortness of Breath  This patient has a history of reported diastolic congestive heart failure, ejection fraction 60 to 65% in July on echocardiogram.  There was some evidence of right volume ventricular overload.  She had an ultrasound of her kidneys in July of this year which was unremarkable, she was noted to have some ascites.  She had an MRI performed in April of this year which showed small volume perihepatic ascites and severe anasarca at that time.  There was some pleural effusions and some splenomegaly.  The patient denies being an alcoholic, denies cirrhosis, denies Tylenol use, denies any liver failure.  The patient had some lab work done today which showed a creatinine of 1.89 which seems to be close to baseline, BUN of 31, sodium of 131 and a potassium of 3.2.  CBC was normal,  The patient reports that she is having some increasing swelling despite taking 60 mg of torsemide in the morning and 60 mg in the evening.  She states she is now becoming more short of breath and when she went to the office today because of her dyspnea and what they told her was fluid on her lungs when they listen they recommended that she come to the emergency department for admission and diuresis.  The patient states she is urinating frequently, there is been no change in urinary frequency, she states that she feels more swollen is gaining weight and now has fluid creeping up her abdomen close to her chest  Past Medical History:  Diagnosis Date   Diastolic congestive heart failure ([place])    Essential hypertension    Fibroids    GERD (gastroesophageal reflux disease)    Gout    Morbid obesity ([place])    Thyroid nodule     Patient Active Problem List   Diagnosis Date Noted   Fluid  overload 06/23/2021   Liver lesion 12/01/2020   Liver disease 12/01/2020   Elevated bilirubin 12/01/2020   OSA (obstructive sleep apnea) 11/16/2020   Acute on chronic heart failure with preserved ejection fraction (HFpEF) ([place]) 09/22/2020   Morbid obesity with BMI of 50.0-59.9, adult ([place]) 09/22/2020   Pulmonary edema 09/16/2020   Hypokalemia 09/16/2020   Prolonged QT interval 09/16/2020   Acute kidney injury ([place]) 09/16/2020   Elevated brain natriuretic peptide (BNP) level 09/16/2020   GERD (gastroesophageal reflux disease) 09/16/2020   Anasarca 09/16/2020   Super obese 09/16/2020   Normocytic anemia 04/23/2020   Family history of colon cancer 04/23/2020   Mass of upper outer quadrant of right breast 11/04/2019   S/P partial thyroidectomy 10/21/2018   Visit for routine gyn exam 07/03/2018    Past Surgical History:  Procedure Laterality Date   BIOPSY  08/03/2020   Procedure: BIOPSY;  Surgeon: Eloise Harman, DO;  [facility];  Service: Endoscopy;;  duodenal gastric   CESAREAN SECTION     COLONOSCOPY WITH PROPOFOL N/A 08/03/2020    Surgeon: Hurshel Keys K, DO; 5 mm tubular adenoma in the sigmoid colon, nonbleeding internal hemorrhoids.  Recommended repeat in 5 years.   ESOPHAGOGASTRODUODENOSCOPY (EGD) WITH PROPOFOL N/A 08/03/2020   Surgeon: Eloise Harman, DO; LA grade C esophagitis without bleeding, gastritis with erythema and shallow ulcerations in gastric  antrum biopsied (negative for H. pylori), normal examined duodenum s/p biopsy (benign).   HYSTERECTOMY ABDOMINAL WITH SALPINGECTOMY  2008   POLYPECTOMY  08/03/2020   Procedure: POLYPECTOMY;  Surgeon: Eloise Harman, DO;  [facility];  Service: Endoscopy;;  colon   THYROIDECTOMY Right 10/21/2018   Procedure: RIGHT HEMITHYROIDECTOMY;  Surgeon: Leta Baptist, MD;  Location: [place];  Service: ENT;  Laterality: Right;     OB History     Gravida  2   Para  1   Term  1    Preterm      AB  1   Living  1      SAB      IAB      Ectopic      Multiple      Live Births              Family History  Problem Relation Age of Onset   Breast cancer Mother    Other Paternal Grandfather        house fire   Other Maternal Grandmother        house fire   Congestive Heart Failure Maternal Grandfather    Colon cancer Father 76   Diverticulitis Brother    Diverticulitis Sister    Colon polyps Sister 33   Diabetes Daughter        borderline    Social History   Tobacco Use   Smoking status: Former    Packs/day: 0.50    Years: 20.00    Pack years: 10.00    Types: Cigarettes    Quit date: 2017    Years since quitting: 5.8   Smokeless tobacco: Never  Vaping Use   Vaping Use: Never used  Substance Use Topics   Alcohol use: Not Currently   Drug use: Never    Home Medications Prior to Admission medications   Medication Sig Start Date End Date Taking? Authorizing Provider  magnesium oxide (MAG-OX) 400 MG tablet Take by mouth. 01/17/21  Yes [provider]  acetaminophen (TYLENOL) 500 MG tablet Take 1,000 mg by mouth every 6 (six) hours as needed for moderate pain or headache.    [provider]  magnesium oxide (MAG-OX) 400 MG tablet TAKE 1 TABLET BY MOUTH ONCE DAILY. 01/17/21   Satira Sark, MD  metolazone (ZAROXOLYN) 2.5 MG tablet Take 1 tablet for the next 3 days then twice weekly. Patient taking differently: 1/2 tablet M W F 02/18/21   Ahmed Prima, Fransisco Hertz, PA-C  omeprazole (PRILOSEC) 20 MG capsule Take 1 capsule (20 mg total) by mouth 2 (two) times daily. 08/03/20 04/13/21  Eloise Harman, DO  omeprazole (PRILOSEC) 40 MG capsule Take 40 mg by mouth daily. 06/15/21   [provider]  potassium chloride SA (KLOR-CON) 20 MEQ tablet Take 40 mEq by mouth 3 (three) times daily. Takes 2 additional tablets by mouth when taking metolazone    [provider]  spironolactone (ALDACTONE) 25 MG tablet Take 12.5  mg by mouth daily. 06/22/21   [provider]  torsemide (DEMADEX) 20 MG tablet Take 3 tablets (60 mg total) by mouth 2 (two) times daily. 02/25/21 05/26/21  Erma Heritage, PA-C    Allergies    Oxycodone-acetaminophen  Review of Systems   Review of Systems  Respiratory:  Positive for shortness of breath.   All other systems reviewed and are negative.  Physical Exam Updated Vital Signs BP 107/78   Pulse 93  Temp 98 F (36.7 C) (Oral)   Resp 18   Ht 1.6 m (5\' 3" )   Wt (!) 138.7 kg   SpO2 100%   BMI 54.17 kg/m   Physical Exam Vitals and nursing note reviewed.  Constitutional:      General: She is not in acute distress.    Appearance: She is well-developed.  HENT:     Head: Normocephalic and atraumatic.     Mouth/Throat:     Pharynx: No oropharyngeal exudate.  Eyes:     General: No scleral icterus.       Right eye: No discharge.        Left eye: No discharge.     Conjunctiva/sclera: Conjunctivae normal.     Pupils: Pupils are equal, round, and reactive to light.  Neck:     Thyroid: No thyromegaly.     Vascular: No JVD.  Cardiovascular:     Rate and Rhythm: Regular rhythm. Tachycardia present.     Heart sounds: Normal heart sounds. No murmur heard.   No friction rub. No gallop.     Comments: Heart rate around 100 bpm Pulmonary:     Effort: Pulmonary effort is normal. No respiratory distress.     Breath sounds: Rales present. No wheezing.     Comments: Subtle rales but no respiratory distress or increased work of breathing Abdominal:     General: Bowel sounds are normal. There is no distension.     Palpations: Abdomen is soft. There is no mass.     Tenderness: There is no abdominal tenderness.  Musculoskeletal:        General: No tenderness. Normal range of motion.     Cervical back: Normal range of motion and neck supple.     Right lower leg: Edema present.     Left lower leg: Edema present.     Comments: There is pitting edema extending from the  feet all the way up to the upper abdomen, anasarca present  Lymphadenopathy:     Cervical: No cervical adenopathy.  Skin:    General: Skin is warm and dry.     Findings: No erythema or rash.  Neurological:     General: No focal deficit present.     Mental Status: She is alert.     Coordination: Coordination normal.  Psychiatric:        Behavior: Behavior normal.    ED Results / Procedures / Treatments   Labs (all labs ordered are listed, but only abnormal results are displayed) Labs Reviewed  COMPREHENSIVE METABOLIC PANEL - Abnormal; Notable for the following components:      Result Value   Potassium 3.0 (*)    Chloride 93 (*)    BUN 32 (*)    Creatinine, Ser 1.88 (*)    Calcium 8.7 (*)    Total Bilirubin 2.4 (*)    GFR, Estimated 33 (*)    All other components within normal limits  BRAIN NATRIURETIC PEPTIDE - Abnormal; Notable for the following components:   B Natriuretic Peptide 357.0 (*)    All other components within normal limits  RESP PANEL BY RT-PCR (FLU A&B, COVID) [id number]  URINALYSIS, ROUTINE W REFLEX MICROSCOPIC    EKG EKG Interpretation  Date/Time:  Thursday June 23 2021 17:18:42 EDT Ventricular Rate:  93 PR Interval:  165 QRS Duration: 72 QT Interval:  402 QTC Calculation: 500 R Axis:   96 Text Interpretation: Sinus rhythm Borderline right axis deviation Low voltage, extremity and precordial leads Nonspecific  T abnormalities, lateral leads Borderline prolonged QT interval unchanged Confirmed by Noemi Chapel [phone]) on 06/23/2021 6:00:36 PM  Radiology DG Chest 2 View  Result Date: 06/23/2021 CLINICAL DATA:  This of breath for 2 weeks, history of prior tobacco use EXAM: CHEST - 2 VIEW COMPARISON:  09/20/2020 FINDINGS: Cardiac shadow is within normal limits. Mild vascular prominence is noted with mild interstitial edema similar to that seen on the prior exam. No focal infiltrate or effusion is noted. No bony abnormality is seen. IMPRESSION: Changes of  mild pulmonary edema. Electronically Signed   By: Inez Catalina M.D.   On: 06/23/2021 19:11    Procedures Procedures   Medications Ordered in ED Medications  furosemide (LASIX) injection 80 mg (80 mg Intravenous Given 06/23/21 1845)    ED Course  I have reviewed the triage vital signs and the nursing notes.  Pertinent labs & imaging results that were available during my care of the patient were reviewed by me and considered in my medical decision making (see chart for details).    MDM Rules/Calculators/A&P                           The patient will needs a metabolic panel, check an ammonia level, check a BMP, check urinalysis, she will likely need to be admitted for diuresis, will start with intravenous Lasix here.  She is already taking some intermittent metolazone but still swelling  This patient has laboratory work-up which shows hypokalemia with a potassium of 3.0, creatinine of 1.88, liver function tests are unremarkable except for a bilirubin of 2.4, BNP is 357 and a chest x-ray shows some mild pulmonary edema.  Discussed with the hospitalist will admit for ongoing diuresis  Final Clinical Impression(s) / ED Diagnoses Final diagnoses:  Anasarca    Rx / DC Orders ED Discharge Orders     None        Noemi Chapel, MD 06/23/21 1954

## 2021-06-24 DIAGNOSIS — R601 Generalized edema: Secondary | ICD-10-CM | POA: Diagnosis not present

## 2021-06-24 LAB — MICROALBUMIN / CREATININE URINE RATIO
Creatinine, Urine: 53.4 mg/dL
Microalb Creat Ratio: 17 mg/g creat (ref 0–29)
Microalb, Ur: 9.2 ug/mL — ABNORMAL HIGH

## 2021-06-24 LAB — BASIC METABOLIC PANEL
Anion gap: 12 (ref 5–15)
BUN: 32 mg/dL — ABNORMAL HIGH (ref 6–20)
CO2: 30 mmol/L (ref 22–32)
Calcium: 9.1 mg/dL (ref 8.9–10.3)
Chloride: 95 mmol/L — ABNORMAL LOW (ref 98–111)
Creatinine, Ser: 1.87 mg/dL — ABNORMAL HIGH (ref 0.44–1.00)
GFR, Estimated: 33 mL/min — ABNORMAL LOW (ref >60)
Glucose, Bld: 89 mg/dL (ref 70–99)
Potassium: 2.9 mmol/L — ABNORMAL LOW (ref 3.5–5.1)
Sodium: 137 mmol/L (ref 135–145)

## 2021-06-24 LAB — MAGNESIUM: Magnesium: 1.9 mg/dL (ref 1.7–2.4)

## 2021-06-24 MED ORDER — ALBUTEROL SULFATE (2.5 MG/3ML) 0.083% IN NEBU
2.5000 mg | INHALATION_SOLUTION | RESPIRATORY_TRACT | Status: DC | PRN
  Administered 2021-06-24 – 2021-06-25 (×2): 2.5 mg via RESPIRATORY_TRACT
  Filled 2021-06-24 (×2): qty 3

## 2021-06-24 MED ORDER — SPIRONOLACTONE 25 MG PO TABS
25.0000 mg | ORAL_TABLET | Freq: Every day | ORAL | Status: DC
  Administered 2021-06-25 – 2021-06-28 (×4): 25 mg via ORAL
  Filled 2021-06-24 (×4): qty 1

## 2021-06-24 MED ORDER — LIVING BETTER WITH HEART FAILURE BOOK: Freq: Once | Status: DC

## 2021-06-24 MED ORDER — POTASSIUM CHLORIDE CRYS ER 20 MEQ PO TBCR
40.0000 meq | EXTENDED_RELEASE_TABLET | ORAL | Status: AC
  Administered 2021-06-24 (×2): 40 meq via ORAL
  Filled 2021-06-24 (×2): qty 2

## 2021-06-24 MED ORDER — METOLAZONE 5 MG PO TABS
2.5000 mg | ORAL_TABLET | Freq: Every day | ORAL | Status: DC
  Administered 2021-06-25 – 2021-06-28 (×4): 2.5 mg via ORAL
  Filled 2021-06-24 (×4): qty 1

## 2021-06-24 NOTE — ED Notes (Signed)
Pt off nightly-bi pap per request

## 2021-06-24 NOTE — ED Notes (Signed)
Pt sitting up on side of bed. Pt states that she just walked to bathroom without getting really short of breath. States she is feeling some better since she came in. Pt states pain is chronic back pain.denies any needs at this time. Call bell in reach.

## 2021-06-24 NOTE — ED Notes (Signed)
Report given to [facility], RN for room [phone]

## 2021-06-24 NOTE — ED Notes (Addendum)
Gave graham crackers and 31ml of diet gingerale.    Ambulatory to restroom.   It is cold in patient room; gave  more warm blankets.

## 2021-06-24 NOTE — ED Notes (Signed)
Pt complaining of freezing. Multiple warm blankets provided. Pt denies any other needs at thistime. Call bell in reach

## 2021-06-24 NOTE — TOC Initial Note (Signed)
Transition of Care [facility]) - Initial/Assessment Note    Patient Details  Name: Lauren Osborn MRN: [id number] Date of Birth: June 13, 1976  Transition of Care [facility]) CM/SW Contact:    Boneta Lucks, RN Phone Number: 06/24/2021, 2:28 PM  Clinical Narrative:    Patient admitted with anasarca. TOC consulted for CHF. Patient states this is her second admission for CHF. She does do daily weights and has been between 280-300lb range. Advise patient to contact her pcp with 2-3 lbs weight difference in the future.  RN will give Living better book for CHF. TOC to follow for discharge needs.               Expected Discharge Plan: Home/Self Care Barriers to Discharge: Continued Medical Work up  Patient Goals and CMS Choice Patient states their goals for this hospitalization and ongoing recovery are:: To return home. CMS Medicare.gov Compare Post Acute Care list provided to:: Patient Choice offered to / list presented to : Patient  Expected Discharge Plan and Services Expected Discharge Plan: Home/Self Care     Prior Living Arrangements/Services   Lives with:: Self Patient language and need for interpreter reviewed:: Yes        Need for Family Participation in Patient Care: Yes (Comment) Care giver support system in place?: Yes (comment)   Criminal Activity/Legal Involvement Pertinent to Current Situation/Hospitalization: No - Comment as needed  Activities of Daily Living Home Assistive Devices/Equipment: None ADL Screening (condition at time of admission) Patient's cognitive ability adequate to safely complete daily activities?: Yes Is the patient deaf or have difficulty hearing?: No Does the patient have difficulty seeing, even when wearing glasses/contacts?: No Does the patient have difficulty concentrating, remembering, or making decisions?: No Patient able to express need for assistance with ADLs?: Yes Does the patient have difficulty dressing or bathing?: No Independently performs  ADLs?: Yes (appropriate for developmental age) Does the patient have difficulty walking or climbing stairs?: No Weakness of Legs: None Weakness of Arms/Hands: None  Permission Sought/Granted     Emotional Assessment    Affect (typically observed): Accepting Orientation: : Oriented to Self, Oriented to Place, Oriented to  Time, Oriented to Situation Alcohol / Substance Use: Not Applicable Psych Involvement: No (comment)  Admission diagnosis:  Anasarca [R60.1] Fluid overload [E87.70] Patient Active Problem List   Diagnosis Date Noted   CKD (chronic kidney disease), stage IV ([place]) 06/23/2021   Liver lesion 12/01/2020   Liver disease 12/01/2020   Elevated bilirubin 12/01/2020   OSA (obstructive sleep apnea) 11/16/2020   Acute on chronic heart failure with preserved ejection fraction (HFpEF) ([place]) 09/22/2020   Morbid obesity with BMI of 50.0-59.9, adult ([place]) 09/22/2020   Pulmonary edema 09/16/2020   Hypokalemia 09/16/2020   Prolonged QT interval 09/16/2020   Acute kidney injury ([place]) 09/16/2020   Elevated brain natriuretic peptide (BNP) level 09/16/2020   GERD (gastroesophageal reflux disease) 09/16/2020   Anasarca 09/16/2020   Super obese 09/16/2020   Normocytic anemia 04/23/2020   Family history of colon cancer 04/23/2020   Mass of upper outer quadrant of right breast 11/04/2019   S/P partial thyroidectomy 10/21/2018   Visit for routine gyn exam 07/03/2018   PCP:  [name], MD Pharmacy:   [facility] [place] [address] [place] [address] Phone: [phone] Fax: [phone]   Readmission Risk Interventions Readmission Risk Prevention Plan 06/24/2021  Transportation Screening Complete  Home Care Screening Complete  Medication Review (RN CM)  Complete  Some recent data might be hidden

## 2021-06-24 NOTE — ED Notes (Signed)
Pt transported to 314 via wheelchair by ED Tech

## 2021-06-24 NOTE — Progress Notes (Signed)
Patient Demographics:    Lauren Osborn, is a 45 y.o. female, DOB - [birth year], ZOX:[id number]  Admit date - 06/23/2021   Admitting Physician Bethena Roys, MD  Outpatient Primary MD for the patient is Lauren Fire, MD  LOS - 1   Chief Complaint  Patient presents with   Shortness of Breath        Subjective:    Lauren Osborn today has no fevers, no emesis,  No chest pain, -Dyspnea on exertion improving, voiding well  Assessment  & Plan :    Principal Problem:   Anasarca Active Problems:   Acute on chronic heart failure with preserved ejection fraction (HFpEF) ([place])   Morbid obesity with BMI of 50.0-59.9, adult ([facility])   Hypokalemia   Prolonged QT interval   OSA (obstructive sleep apnea)   CKD (chronic kidney disease), stage IV [facility])  Brief summary 45 y.o. female with medical history significant for dCHF, OSA, hypertension, gout, Morbid obesity admitted on 06/23/2021 with CHF exacerbation with worsening dyspnea and worsening swelling  A/p 1)HFpEF--- patient admitted with acute on chronic diastolic dysfunction CHF, repeat echo with preserved EF >60 % --BNP 357 but this may be underestimated in obese patients with diastolic dysfunction -Chest x-ray consistent with mild pulmonary edema -Weight Chart reflects about 14 pound weight gain -PTA she was compliant with torsemide 60 mg twice daily and metolazone 2.5 mg 3 times weekly -Failed oral diuretics as outpatient PTA , daily weights, fluid input and output charting -IV Lasix 60 mg daily along with metolazone as ordered   2)CKD 3B- -Renal function appears to be close to baseline, monitor renal function closely with diuresis -- renally adjust medications, avoid nephrotoxic agents / dehydration  / hypotension   3)Hypokalemia--- magnesium WNL, replace and recheck -Started on scheduled potassium replacement     4)Morbid Obesity-/OSA -Low  calorie diet, portion control and increase physical activity discussed with patient --Patient admits to-Excessive Pepsi/soda intake--- she appears to get at least 1500 cal from just soda per day  -Body mass index is 53.98 kg/m. -CPAP nightly advised    5)H/o Prior partial thyroidectomy--- TSH WNL  Disposition/Need for in-Hospital Stay- patient unable to be discharged at this time due to diastolic CHF exacerbation requiring IV diuresis and monitoring of electrolytes and renal parameters -Possible discharge home in a couple of days if improved with IV diuresis*  Status is: Inpatient  Remains inpatient appropriate because: Please see disposition above  Disposition: The patient is from: Home              Anticipated d/c is to: Home              Anticipated d/c date is: 2 days              Patient currently is not medically stable to d/c. Barriers: Not Clinically Stable-   Code Status :  -  Code Status: Full Code   Family Communication:   NA (patient is alert, awake and coherent)   Consults  :  na  DVT Prophylaxis  :   - SCDs       Lab Results  Component Value Date   PLT 260 06/23/2021    Inpatient Medications  Scheduled Meds:  enoxaparin (LOVENOX) injection  70 mg Subcutaneous Q24H   furosemide  60 mg Intravenous Q12H   Living Better with Heart Failure Book   Does not apply Once   pantoprazole  40 mg Oral Daily   potassium chloride SA  40 mEq Oral Daily   spironolactone  12.5 mg Oral Daily   Continuous Infusions: PRN Meds:.acetaminophen **OR** acetaminophen, albuterol, polyethylene glycol    Anti-infectives (From admission, onward)    None         Objective:   Vitals:   06/24/21 1230 06/24/21 1245 06/24/21 1330 06/24/21 1408  BP:   128/90   Pulse: 97 88 95   Resp: (!) 30 18 18    Temp:   (!) 97.5 F (36.4 C)   TempSrc:   Oral   SpO2: 100% 99% 100% 98%  Weight:   (!) 138.2 kg   Height:   5\' 3"  (1.6 m)     Wt Readings from Last 3 Encounters:   06/24/21 (!) 138.2 kg  04/19/21 132 kg  04/13/21 134.7 kg    No intake or output data in the 24 hours ending 06/24/21 1634   Physical Exam  Gen:- Awake Alert,  in no apparent distress  HEENT:- [place].AT, No sclera icterus Neck-Supple Neck,No JVD,.  Lungs-diminished with fine bibasilar rales  CV- S1, S2 normal, regular  Abd-  +ve B.Sounds, Abd Soft, No tenderness, increased truncal adiposity Extremity/Skin:- +ve  edema, pedal pulses present  Psych-affect is appropriate, oriented x3 Neuro-generalized weakness no new focal deficits, no tremors   Data Review:   Micro Results Recent Results (from the past 240 hour(s))  Resp Panel by RT-PCR (Flu A&B, Covid) Nasopharyngeal Swab     Status: None   Collection Time: 06/23/21  6:46 PM   Specimen: Nasopharyngeal Swab; Nasopharyngeal(NP) swabs in vial transport medium  Result Value Ref Range Status   SARS Coronavirus 2 by RT PCR NEGATIVE NEGATIVE Final    Comment: (NOTE) SARS-CoV-2 target nucleic acids are NOT DETECTED.  The SARS-CoV-2 RNA is generally detectable in upper respiratory specimens during the acute phase of infection. The lowest concentration of SARS-CoV-2 viral copies this assay can detect is 138 copies/mL. A negative result does not preclude SARS-Cov-2 infection and should not be used as the sole basis for treatment or other patient management decisions. A negative result may occur with  improper specimen collection/handling, submission of specimen other than nasopharyngeal swab, presence of viral mutation(s) within the areas targeted by this assay, and inadequate number of viral copies(<138 copies/mL). A negative result must be combined with clinical observations, patient history, and epidemiological information. The expected result is Negative.  Fact Sheet for Patients:  EntrepreneurPulse.com.au  Fact Sheet for Healthcare Providers:  IncredibleEmployment.be  This test is no t yet  approved or cleared by the [place] FDA and  has been authorized for detection and/or diagnosis of SARS-CoV-2 by FDA under an Emergency Use Authorization (EUA). This EUA will remain  in effect (meaning this test can be used) for the duration of the COVID-19 declaration under Section 564(b)(1) of the Act, 21 U.S.C.section 360bbb-3(b)(1), unless the authorization is terminated  or revoked sooner.       Influenza A by PCR NEGATIVE NEGATIVE Final   Influenza B by PCR NEGATIVE NEGATIVE Final    Comment: (NOTE) The Xpert Xpress SARS-CoV-2/FLU/RSV plus assay is intended as an aid in the diagnosis of influenza from Nasopharyngeal swab specimens and should not be used as a sole basis for treatment. Nasal washings and aspirates are unacceptable for  Xpert Xpress SARS-CoV-2/FLU/RSV testing.  Fact Sheet for Patients: EntrepreneurPulse.com.au  Fact Sheet for Healthcare Providers: IncredibleEmployment.be  This test is not yet approved or cleared by the [place] FDA and has been authorized for detection and/or diagnosis of SARS-CoV-2 by FDA under an Emergency Use Authorization (EUA). This EUA will remain in effect (meaning this test can be used) for the duration of the COVID-19 declaration under Section 564(b)(1) of the Act, 21 U.S.C. section 360bbb-3(b)(1), unless the authorization is terminated or revoked.  Performed at [facility], [address] [place], [place] [address]     Radiology Reports DG Chest 2 View  Result Date: 06/23/2021 CLINICAL DATA:  This of breath for 2 weeks, history of prior tobacco use EXAM: CHEST - 2 VIEW COMPARISON:  09/20/2020 FINDINGS: Cardiac shadow is within normal limits. Mild vascular prominence is noted with mild interstitial edema similar to that seen on the prior exam. No focal infiltrate or effusion is noted. No bony abnormality is seen. IMPRESSION: Changes of mild pulmonary edema. Electronically Signed   By:  Inez Catalina M.D.   On: 06/23/2021 19:11     CBC Recent Labs  Lab 06/23/21 1337  WBC 6.6  HGB 12.2  HCT 37.4  PLT 260  MCV 95.9  MCH 31.3  MCHC 32.6  RDW 15.9*    Chemistries  Recent Labs  Lab 06/23/21 1337 06/23/21 1807 06/24/21 0403  NA 131* 135 137  K 3.2* 3.0* 2.9*  CL 93* 93* 95*  CO2 28 27 30   GLUCOSE 106* 93 89  BUN 31* 32* 32*  CREATININE 1.89* 1.88* 1.87*  CALCIUM 9.0 8.7* 9.1  MG  --   --  1.9  AST  --  38  --   ALT  --  17  --   ALKPHOS  --  94  --   BILITOT  --  2.4*  --    ------------------------------------------------------------------------------------------------------------------ No results for input(s): CHOL, HDL, LDLCALC, TRIG, CHOLHDL, LDLDIRECT in the last 72 hours.  No results found for: HGBA1C ------------------------------------------------------------------------------------------------------------------ No results for input(s): TSH, T4TOTAL, T3FREE, THYROIDAB in the last 72 hours.  Invalid input(s): FREET3 ------------------------------------------------------------------------------------------------------------------ No results for input(s): VITAMINB12, FOLATE, FERRITIN, TIBC, IRON, RETICCTPCT in the last 72 hours.  Coagulation profile No results for input(s): INR, PROTIME in the last 168 hours.  No results for input(s): DDIMER in the last 72 hours.  Cardiac Enzymes No results for input(s): CKMB, TROPONINI, MYOGLOBIN in the last 168 hours.  Invalid input(s): CK ------------------------------------------------------------------------------------------------------------------    Component Value Date/Time   BNP 357.0 (H) 06/23/2021 1807     Roxan Hockey M.D on 06/24/2021 at 4:34 PM  Go to [email or web address] - for contact info  [facility] - Office  [phone]

## 2021-06-24 NOTE — Progress Notes (Signed)
CPAP set up for hs medium mask auto titrate max 20 min 5 spo2 96% machine plugged in red outlet

## 2021-06-25 DIAGNOSIS — R601 Generalized edema: Secondary | ICD-10-CM | POA: Diagnosis not present

## 2021-06-25 LAB — CBC
HCT: 34.8 % — ABNORMAL LOW (ref 36.0–46.0)
Hemoglobin: 11.1 g/dL — ABNORMAL LOW (ref 12.0–15.0)
MCH: 30.9 pg (ref 26.0–34.0)
MCHC: 31.9 g/dL (ref 30.0–36.0)
MCV: 96.9 fL (ref 80.0–100.0)
Platelets: 225 10*3/uL (ref 150–400)
RBC: 3.59 MIL/uL — ABNORMAL LOW (ref 3.87–5.11)
RDW: 16 % — ABNORMAL HIGH (ref 11.5–15.5)
WBC: 6.1 10*3/uL (ref 4.0–10.5)
nRBC: 0 % (ref 0.0–0.2)

## 2021-06-25 LAB — RENAL FUNCTION PANEL
Albumin: 3.5 g/dL (ref 3.5–5.0)
Anion gap: 12 (ref 5–15)
BUN: 33 mg/dL — ABNORMAL HIGH (ref 6–20)
CO2: 26 mmol/L (ref 22–32)
Calcium: 9 mg/dL (ref 8.9–10.3)
Chloride: 98 mmol/L (ref 98–111)
Creatinine, Ser: 1.95 mg/dL — ABNORMAL HIGH (ref 0.44–1.00)
GFR, Estimated: 31 mL/min — ABNORMAL LOW (ref >60)
Glucose, Bld: 84 mg/dL (ref 70–99)
Phosphorus: 3.8 mg/dL (ref 2.5–4.6)
Potassium: 3.8 mmol/L (ref 3.5–5.1)
Sodium: 136 mmol/L (ref 135–145)

## 2021-06-25 LAB — BASIC METABOLIC PANEL
Anion gap: 13 (ref 5–15)
BUN: 32 mg/dL — ABNORMAL HIGH (ref 6–20)
CO2: 27 mmol/L (ref 22–32)
Calcium: 9.2 mg/dL (ref 8.9–10.3)
Chloride: 96 mmol/L — ABNORMAL LOW (ref 98–111)
Creatinine, Ser: 1.93 mg/dL — ABNORMAL HIGH (ref 0.44–1.00)
GFR, Estimated: 32 mL/min — ABNORMAL LOW (ref >60)
Glucose, Bld: 83 mg/dL (ref 70–99)
Potassium: 3.8 mmol/L (ref 3.5–5.1)
Sodium: 136 mmol/L (ref 135–145)

## 2021-06-25 MED ORDER — FUROSEMIDE 10 MG/ML IJ SOLN
80.0000 mg | Freq: Every morning | INTRAMUSCULAR | Status: DC
  Administered 2021-06-26: 80 mg via INTRAVENOUS
  Filled 2021-06-25: qty 8

## 2021-06-25 MED ORDER — FUROSEMIDE 10 MG/ML IJ SOLN
80.0000 mg | Freq: Once | INTRAMUSCULAR | Status: AC
  Administered 2021-06-25: 80 mg via INTRAVENOUS
  Filled 2021-06-25: qty 8

## 2021-06-25 MED ORDER — FUROSEMIDE 10 MG/ML IJ SOLN: 60.0000 mg | Freq: Every evening | INTRAMUSCULAR | Status: DC

## 2021-06-25 NOTE — Progress Notes (Signed)
Patient Demographics:    Lauren Osborn, is a 45 y.o. female, DOB - [birth year], JKD:[id number]  Admit date - 06/23/2021   Admitting Physician Bethena Roys, MD  Outpatient Primary MD for the patient is [name], MD  LOS - 2   Chief Complaint  Patient presents with   Shortness of Breath        Subjective:    Lauren Osborn today has no fevers, no emesis,  No chest pain, -Patient feels very short of breath after washing off today -Complains of excessive fatigue with ADLs -  Assessment  & Plan :    Principal Problem:   Anasarca Active Problems:   Acute on chronic heart failure with preserved ejection fraction (HFpEF) ([place])   Morbid obesity with BMI of 50.0-59.9, adult (HCC)   Hypokalemia   Prolonged QT interval   OSA (obstructive sleep apnea)   CKD (chronic kidney disease), stage IV [facility])  Brief summary 45 y.o. female with medical history significant for dCHF, OSA, hypertension, gout, Morbid obesity admitted on 06/23/2021 with CHF exacerbation with worsening dyspnea and worsening swelling/anasarca  A/p 1)HFpEF--- patient admitted with acute on chronic diastolic dysfunction CHF, repeat echo with preserved EF >60 % --BNP 357 but this may be underestimated in obese patients with diastolic dysfunction -Chest x-ray consistent with mild pulmonary edema -On admission weight Chart reflected about 14 pound weight gain -PTA she was compliant with torsemide 60 mg twice daily and metolazone 2.5 mg 3 times weekly -Failed oral diuretics as outpatient PTA , daily weights, fluid input and output charting -Change IV Lasix to 80 mg every morning and 60 mg every afternoon with metolazone as ordered -Continue Aldactone Filed Weights   06/23/21 1645 06/24/21 1330 06/25/21 0522  Weight: (!) 138.7 kg (!) 138.2 kg 135.2 kg     2)CKD 3B- -Renal function appears to be close to baseline, monitor renal  function closely with diuresis -- renally adjust medications, avoid nephrotoxic agents / dehydration  / hypotension Lab Results  Component Value Date   CREATININE 1.93 (H) 06/25/2021   CREATININE 1.95 (H) 06/25/2021   CREATININE 1.87 (H) 06/24/2021      3)Hypokalemia--- magnesium WNL, replace and recheck -Started on scheduled potassium replacement     4)Morbid Obesity-/OSA -Low calorie diet, portion control and increase physical activity discussed with patient --Patient admits to-Excessive Pepsi/soda intake--- she appears to get at least 1500 cal from just soda per day  -Body mass index is 52.8 kg/m. -CPAP nightly advised    5)H/o Prior partial thyroidectomy--- TSH WNL  Disposition/Need for in-Hospital Stay- patient unable to be discharged at this time due to diastolic CHF exacerbation requiring IV diuresis and monitoring of electrolytes and renal parameters -Possible discharge home in a couple of days if improved with IV diuresis  Status is: Inpatient  Remains inpatient appropriate because: Please see disposition above  Disposition: The patient is from: Home              Anticipated d/c is to: Home              Anticipated d/c date is: 2 days              Patient currently is not medically stable to d/c. Barriers: Not Clinically  Stable-   Code Status :  -  Code Status: Full Code   Family Communication:   NA (patient is alert, awake and coherent)   Consults  :  na  DVT Prophylaxis  :   - SCDs       Lab Results  Component Value Date   PLT 225 06/25/2021    Inpatient Medications  Scheduled Meds:  enoxaparin (LOVENOX) injection  70 mg Subcutaneous Q24H   [START ON 06/26/2021] furosemide  60 mg Intravenous QPM   furosemide  80 mg Intravenous Once   [START ON 06/26/2021] furosemide  80 mg Intravenous q morning   Living Better with Heart Failure Book   Does not apply Once   metolazone  2.5 mg Oral Daily   pantoprazole  40 mg Oral Daily   potassium chloride SA  40  mEq Oral Daily   spironolactone  25 mg Oral Daily   Continuous Infusions: PRN Meds:.acetaminophen **OR** acetaminophen, albuterol, polyethylene glycol    Anti-infectives (From admission, onward)    None         Objective:   Vitals:   06/25/21 0522 06/25/21 0836 06/25/21 1156 06/25/21 1248  BP: 93/68   106/86  Pulse: 88   99  Resp: 17   20  Temp: 98.1 F (36.7 C)   98 F (36.7 C)  TempSrc:    Oral  SpO2: 97% 97% 98% 99%  Weight: 135.2 kg     Height:        Wt Readings from Last 3 Encounters:  06/25/21 135.2 kg  04/19/21 132 kg  04/13/21 134.7 kg     Intake/Output Summary (Last 24 hours) at 06/25/2021 1634 Last data filed at 06/25/2021 0900 Gross per 24 hour  Intake 240 ml  Output 100 ml  Net 140 ml    Physical Exam  Gen:- Awake Alert,  in no apparent distress  HEENT:- [place].AT, No sclera icterus Neck-Supple Neck,No JVD,.  Lungs-diminished breath sounds with fine bibasilar rales  CV- S1, S2 normal, regular  Abd-  +ve B.Sounds, Abd Soft, No tenderness, increased truncal adiposity Extremity/Skin:- +ve 3 edema/anasarca ,pedal pulses present  Psych-affect is appropriate, oriented x3 Neuro-generalized weakness no new focal deficits, no tremors   Data Review:   Micro Results Recent Results (from the past 240 hour(s))  Resp Panel by RT-PCR (Flu A&B, Covid) Nasopharyngeal Swab     Status: None   Collection Time: 06/23/21  6:46 PM   Specimen: Nasopharyngeal Swab; Nasopharyngeal(NP) swabs in vial transport medium  Result Value Ref Range Status   SARS Coronavirus 2 by RT PCR NEGATIVE NEGATIVE Final    Comment: (NOTE) SARS-CoV-2 target nucleic acids are NOT DETECTED.  The SARS-CoV-2 RNA is generally detectable in upper respiratory specimens during the acute phase of infection. The lowest concentration of SARS-CoV-2 viral copies this assay can detect is 138 copies/mL. A negative result does not preclude SARS-Cov-2 infection and should not be used as the sole  basis for treatment or other patient management decisions. A negative result may occur with  improper specimen collection/handling, submission of specimen other than nasopharyngeal swab, presence of viral mutation(s) within the areas targeted by this assay, and inadequate number of viral copies(<138 copies/mL). A negative result must be combined with clinical observations, patient history, and epidemiological information. The expected result is Negative.  Fact Sheet for Patients:  EntrepreneurPulse.com.au  Fact Sheet for Healthcare Providers:  IncredibleEmployment.be  This test is no t yet approved or cleared by the [place] and  has been authorized for detection and/or diagnosis of SARS-CoV-2 by FDA under an Emergency Use Authorization (EUA). This EUA will remain  in effect (meaning this test can be used) for the duration of the COVID-19 declaration under Section 564(b)(1) of the Act, 21 U.S.C.section 360bbb-3(b)(1), unless the authorization is terminated  or revoked sooner.       Influenza A by PCR NEGATIVE NEGATIVE Final   Influenza B by PCR NEGATIVE NEGATIVE Final    Comment: (NOTE) The Xpert Xpress SARS-CoV-2/FLU/RSV plus assay is intended as an aid in the diagnosis of influenza from Nasopharyngeal swab specimens and should not be used as a sole basis for treatment. Nasal washings and aspirates are unacceptable for Xpert Xpress SARS-CoV-2/FLU/RSV testing.  Fact Sheet for Patients: EntrepreneurPulse.com.au  Fact Sheet for Healthcare Providers: IncredibleEmployment.be  This test is not yet approved or cleared by the [place] FDA and has been authorized for detection and/or diagnosis of SARS-CoV-2 by FDA under an Emergency Use Authorization (EUA). This EUA will remain in effect (meaning this test can be used) for the duration of the COVID-19 declaration under Section 564(b)(1) of the Act,  21 U.S.C. section 360bbb-3(b)(1), unless the authorization is terminated or revoked.  Performed at [facility], [address], [place], [place] [address]     Radiology Reports DG Chest 2 View  Result Date: 06/23/2021 CLINICAL DATA:  This of breath for 2 weeks, history of prior tobacco use EXAM: CHEST - 2 VIEW COMPARISON:  09/20/2020 FINDINGS: Cardiac shadow is within normal limits. Mild vascular prominence is noted with mild interstitial edema similar to that seen on the prior exam. No focal infiltrate or effusion is noted. No bony abnormality is seen. IMPRESSION: Changes of mild pulmonary edema. Electronically Signed   By: Inez Catalina M.D.   On: 06/23/2021 19:11     CBC Recent Labs  Lab 06/23/21 1337 06/25/21 0433  WBC 6.6 6.1  HGB 12.2 11.1*  HCT 37.4 34.8*  PLT 260 225  MCV 95.9 96.9  MCH 31.3 30.9  MCHC 32.6 31.9  RDW 15.9* 16.0*    Chemistries  Recent Labs  Lab 06/23/21 1337 06/23/21 1807 06/24/21 0403 06/25/21 0433  NA 131* 135 137 136  136  K 3.2* 3.0* 2.9* 3.8  3.8  CL 93* 93* 95* 96*  98  CO2 28 27 30 27  26   GLUCOSE 106* 93 89 83  84  BUN 31* 32* 32* 32*  33*  CREATININE 1.89* 1.88* 1.87* 1.93*  1.95*  CALCIUM 9.0 8.7* 9.1 9.2  9.0  MG  --   --  1.9  --   AST  --  38  --   --   ALT  --  17  --   --   ALKPHOS  --  94  --   --   BILITOT  --  2.4*  --   --    ------------------------------------------------------------------------------------------------------------------ No results for input(s): CHOL, HDL, LDLCALC, TRIG, CHOLHDL, LDLDIRECT in the last 72 hours.  No results found for: HGBA1C ------------------------------------------------------------------------------------------------------------------ No results for input(s): TSH, T4TOTAL, T3FREE, THYROIDAB in the last 72 hours.  Invalid input(s): FREET3 ------------------------------------------------------------------------------------------------------------------ No results for  input(s): VITAMINB12, FOLATE, FERRITIN, TIBC, IRON, RETICCTPCT in the last 72 hours.  Coagulation profile No results for input(s): INR, PROTIME in the last 168 hours.  No results for input(s): DDIMER in the last 72 hours.  Cardiac Enzymes No results for input(s): CKMB, TROPONINI, MYOGLOBIN in the last 168 hours.  Invalid input(s): CK ------------------------------------------------------------------------------------------------------------------  Component Value Date/Time   BNP 357.0 (H) 06/23/2021 1807     Roxan Hockey M.D on 06/25/2021 at 4:34 PM  Go to [email or web address] - for contact info  [facility] - Office  [phone]

## 2021-06-26 DIAGNOSIS — R601 Generalized edema: Secondary | ICD-10-CM | POA: Diagnosis not present

## 2021-06-26 DIAGNOSIS — N179 Acute kidney failure, unspecified: Secondary | ICD-10-CM

## 2021-06-26 DIAGNOSIS — G4733 Obstructive sleep apnea (adult) (pediatric): Secondary | ICD-10-CM

## 2021-06-26 DIAGNOSIS — E876 Hypokalemia: Secondary | ICD-10-CM | POA: Diagnosis not present

## 2021-06-26 DIAGNOSIS — N1832 Chronic kidney disease, stage 3b: Secondary | ICD-10-CM

## 2021-06-26 DIAGNOSIS — I5033 Acute on chronic diastolic (congestive) heart failure: Secondary | ICD-10-CM | POA: Diagnosis not present

## 2021-06-26 LAB — BASIC METABOLIC PANEL
Anion gap: 12 (ref 5–15)
BUN: 35 mg/dL — ABNORMAL HIGH (ref 6–20)
CO2: 28 mmol/L (ref 22–32)
Calcium: 9 mg/dL (ref 8.9–10.3)
Chloride: 94 mmol/L — ABNORMAL LOW (ref 98–111)
Creatinine, Ser: 2.2 mg/dL — ABNORMAL HIGH (ref 0.44–1.00)
GFR, Estimated: 27 mL/min — ABNORMAL LOW (ref >60)
Glucose, Bld: 89 mg/dL (ref 70–99)
Potassium: 3.2 mmol/L — ABNORMAL LOW (ref 3.5–5.1)
Sodium: 134 mmol/L — ABNORMAL LOW (ref 135–145)

## 2021-06-26 MED ORDER — FUROSEMIDE 10 MG/ML IJ SOLN
60.0000 mg | Freq: Two times a day (BID) | INTRAMUSCULAR | Status: DC
  Administered 2021-06-26 – 2021-06-28 (×4): 60 mg via INTRAVENOUS
  Filled 2021-06-26 (×4): qty 6

## 2021-06-26 MED ORDER — POTASSIUM CHLORIDE CRYS ER 20 MEQ PO TBCR
40.0000 meq | EXTENDED_RELEASE_TABLET | Freq: Two times a day (BID) | ORAL | Status: DC
  Administered 2021-06-26 (×2): 40 meq via ORAL
  Filled 2021-06-26 (×2): qty 2

## 2021-06-26 NOTE — Progress Notes (Signed)
Patient Demographics:    Lauren Osborn, is a 45 y.o. female, DOB - 11/21/1975, GYJ:[id number]  Admit date - 06/23/2021   Admitting Physician Ejiroghene Arlyce Dice, MD  Outpatient Primary MD for the patient is Lauren Fire, MD  LOS - 3   Chief Complaint  Patient presents with   Shortness of Breath        Subjective:    Lauren Osborn is reporting no chest pain, no nausea, no vomiting.  Some improvement in her breathing expressed.  Still with orthopnea, signs of fluid overload on examination with lower extremity swelling/anasarca and dyspnea on exertion.  No requiring oxygen supplementation.    Assessment  & Plan :    Principal Problem:   Anasarca Active Problems:   Hypokalemia   Prolonged QT interval   Acute on chronic heart failure with preserved ejection fraction (HFpEF) (HCC)   Morbid obesity with BMI of 50.0-59.9, adult (HCC)   OSA (obstructive sleep apnea)   CKD (chronic kidney disease), stage IV [facility])  Brief summary 45 y.o. female with medical history significant for dCHF, OSA, hypertension, gout, Morbid obesity admitted on 06/23/2021 with CHF exacerbation with worsening dyspnea and worsening swelling/anasarca  A/p 1)HFpEF--- patient admitted with acute on chronic diastolic dysfunction CHF, repeat echo with preserved EF >60 % --BNP 357 but this may be underestimated in obese patients with diastolic dysfunction. -Chest x-ray consistent with mild pulmonary edema, fine crackles appreciated on exam and patient demonstrating positive orthopnea symptoms. -On admission weight Chart reflected about 14 pound weight gain from her baseline.  -Patient reported that she was compliant with her medications prior to admission (torsemide 60 mg twice daily and metolazone 2.5 mg 3 times weekly); but expressed sudden indiscretion and noncompliance with diet. -Continue to follow daily weights, strict I's and  O's and low-sodium diet. -Change IV Lasix; dose adjusted in the setting of elevation in her creatinine level. with metolazone as ordered -Continue Aldactone for now. 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 -- Continue renally adjusting medications, avoid nephrotoxic agents / dehydration  / hypotension -Lasix dose has been adjusted we will continue to closely follow renal function trend.  Lab Results  Component Value Date   CREATININE 2.20 (H) 06/26/2021   CREATININE 1.93 (H) 06/25/2021   CREATININE 1.95 (H) 06/25/2021     3)Hypokalemia--- magnesium WNL -Low potassium in the setting of diuresis -Continue to follow trend and further replete as needed. -Currently receiving maintenance supplementation.    4)Morbid Obesity/OSA -Low calorie diet, portion control and increase physical activity discussed with patient --Patient admits to-Excessive Pepsi/soda intake--- she appears to get at least 1500 cal from just soda per day  -Body mass index is 54.17 kg/m. -Continue the use of CPAP nightly. -Patient in benefit of outpatient follow-up with bariatric clinic.    5)H/o Prior partial thyroidectomy---  -TSH WNL -Continue to monitor levels intermittently.  Disposition/Need for in-Hospital Stay- patient unable to be discharged at this time due to diastolic CHF exacerbation requiring IV diuresis  and monitoring of electrolytes and renal parameters -Possible discharge home in a couple of days once improved with IV diuresis  Status is: Inpatient  Remains inpatient appropriate because: Please see disposition above  Disposition: The patient is from: Home              Anticipated d/c is to: Home              Anticipated d/c date is: 1-2 days               Patient currently is not medically stable to d/c.  Barriers: Not Clinically Stable-   Code Status :  -  Code Status: Full Code   Family Communication:   NA (patient is alert, awake and coherent)   Consults  :  na  DVT Prophylaxis  :   - SCDs       Lab Results  Component Value Date   PLT 225 06/25/2021    Inpatient Medications  Scheduled Meds:  enoxaparin (LOVENOX) injection  70 mg Subcutaneous Q24H   furosemide  60 mg Intravenous QPM   furosemide  80 mg Intravenous q morning   Living Better with Heart Failure Book   Does not apply Once   metolazone  2.5 mg Oral Daily   pantoprazole  40 mg Oral Daily   potassium chloride SA  40 mEq Oral BID   spironolactone  25 mg Oral Daily   Continuous Infusions: PRN Meds:.acetaminophen **OR** acetaminophen, albuterol, polyethylene glycol    Anti-infectives (From admission, onward)    None         Objective:   Vitals:   06/25/21 2107 06/26/21 0500 06/26/21 0600 06/26/21 1321  BP: 101/78 (!) 93/58  94/68  Pulse: 99 89  90  Resp: 18 18  20   Temp: 98.2 F (36.8 C) (!) 97.3 F (36.3 C)    TempSrc:      SpO2: 97% 97%  98%  Weight:  (!) 140.1 kg (!) 138.7 kg   Height:        Wt Readings from Last 3 Encounters:  06/26/21 (!) 138.7 kg  04/19/21 132 kg  04/13/21 134.7 kg     Intake/Output Summary (Last 24 hours) at 06/26/2021 1730 Last data filed at 06/26/2021 1700 Gross per 24 hour  Intake 2112 ml  Output 1750 ml  Net 362 ml    Physical Exam General exam: Alert, awake, oriented x 3; reports improvement in her breathing but is still having orthopnea symptoms and shortness of breath with exertion.  Generalized swelling still appreciated on examination. Respiratory system: Fine crackles at the bases appreciated on exam; no using accessory muscle.  No requiring oxygen supplementation. Cardiovascular system:RRR. No rubs or gallops; unable to assess JVD with body habitus. Gastrointestinal system: Abdomen is obese,  nondistended, soft and nontender. No organomegaly or masses felt. Normal bowel sounds heard. Central nervous system: Alert and oriented. No focal neurological deficits. Extremities: No 3+ edema appreciated bilaterally; no cyanosis or clubbing. Skin: No petechiae. Psychiatry: Judgement and insight appear normal. Mood & affect appropriate.     Data Review:   Micro Results Recent Results (from the past 240 hour(s))  Resp Panel by RT-PCR (Flu A&B, Covid) Nasopharyngeal Swab     Status: None   Collection Time: 06/23/21  6:46 PM   Specimen: Nasopharyngeal Swab; Nasopharyngeal(NP) swabs in vial transport medium  Result Value Ref Range Status   SARS Coronavirus 2 by RT PCR NEGATIVE NEGATIVE Final    Comment: (NOTE) SARS-CoV-2 target  nucleic acids are NOT DETECTED.  The SARS-CoV-2 RNA is generally detectable in upper respiratory specimens during the acute phase of infection. The lowest concentration of SARS-CoV-2 viral copies this assay can detect is 138 copies/mL. A negative result does not preclude SARS-Cov-2 infection and should not be used as the sole basis for treatment or other patient management decisions. A negative result may occur with  improper specimen collection/handling, submission of specimen other than nasopharyngeal swab, presence of viral mutation(s) within the areas targeted by this assay, and inadequate number of viral copies(<138 copies/mL). A negative result must be combined with clinical observations, patient history, and epidemiological information. The expected result is Negative.  Fact Sheet for Patients:  EntrepreneurPulse.com.au  Fact Sheet for Healthcare Providers:  IncredibleEmployment.be  This test is no t yet approved or cleared by the [place] FDA and  has been authorized for detection and/or diagnosis of SARS-CoV-2 by FDA under an Emergency Use Authorization (EUA). This EUA will remain  in effect (meaning this  test can be used) for the duration of the COVID-19 declaration under Section 564(b)(1) of the Act, 21 U.S.C.section 360bbb-3(b)(1), unless the authorization is terminated  or revoked sooner.       Influenza A by PCR NEGATIVE NEGATIVE Final   Influenza B by PCR NEGATIVE NEGATIVE Final    Comment: (NOTE) The Xpert Xpress SARS-CoV-2/FLU/RSV plus assay is intended as an aid in the diagnosis of influenza from Nasopharyngeal swab specimens and should not be used as a sole basis for treatment. Nasal washings and aspirates are unacceptable for Xpert Xpress SARS-CoV-2/FLU/RSV testing.  Fact Sheet for Patients: EntrepreneurPulse.com.au  Fact Sheet for Healthcare Providers: IncredibleEmployment.be  This test is not yet approved or cleared by the [place] FDA and has been authorized for detection and/or diagnosis of SARS-CoV-2 by FDA under an Emergency Use Authorization (EUA). This EUA will remain in effect (meaning this test can be used) for the duration of the COVID-19 declaration under Section 564(b)(1) of the Act, 21 U.S.C. section 360bbb-3(b)(1), unless the authorization is terminated or revoked.  Performed at [facility], [address], [place], [place] [address]     Radiology Reports DG Chest 2 View  Result Date: 06/23/2021 CLINICAL DATA:  This of breath for 2 weeks, history of prior tobacco use EXAM: CHEST - 2 VIEW COMPARISON:  09/20/2020 FINDINGS: Cardiac shadow is within normal limits. Mild vascular prominence is noted with mild interstitial edema similar to that seen on the prior exam. No focal infiltrate or effusion is noted. No bony abnormality is seen. IMPRESSION: Changes of mild pulmonary edema. Electronically Signed   By: Inez Catalina M.D.   On: 06/23/2021 19:11     CBC Recent Labs  Lab 06/23/21 1337 06/25/21 0433  WBC 6.6 6.1  HGB 12.2 11.1*  HCT 37.4 34.8*  PLT 260 225  MCV 95.9 96.9  MCH 31.3 30.9  MCHC 32.6 31.9  RDW  15.9* 16.0*    Chemistries  Recent Labs  Lab 06/23/21 1337 06/23/21 1807 06/24/21 0403 06/25/21 0433 06/26/21 0427  NA 131* 135 137 136  136 134*  K 3.2* 3.0* 2.9* 3.8  3.8 3.2*  CL 93* 93* 95* 96*  98 94*  CO2 28 27 30 27  26 28   GLUCOSE 106* 93 89 83  84 89  BUN 31* 32* 32* 32*  33* 35*  CREATININE 1.89* 1.88* 1.87* 1.93*  1.95* 2.20*  CALCIUM 9.0 8.7* 9.1 9.2  9.0 9.0  MG  --   --  1.9  --   --   AST  --  38  --   --   --   ALT  --  17  --   --   --   ALKPHOS  --  94  --   --   --   BILITOT  --  2.4*  --   --   --        Component Value Date/Time   BNP 357.0 (H) 06/23/2021 1807     Barton Dubois M.D on 06/26/2021 at 5:30 PM  Go to [email or web address] - for contact info  [facility] - Office  [phone]

## 2021-06-27 DIAGNOSIS — I5033 Acute on chronic diastolic (congestive) heart failure: Secondary | ICD-10-CM | POA: Diagnosis not present

## 2021-06-27 DIAGNOSIS — R601 Generalized edema: Secondary | ICD-10-CM | POA: Diagnosis not present

## 2021-06-27 DIAGNOSIS — E876 Hypokalemia: Secondary | ICD-10-CM | POA: Diagnosis not present

## 2021-06-27 LAB — BASIC METABOLIC PANEL
Anion gap: 11 (ref 5–15)
BUN: 37 mg/dL — ABNORMAL HIGH (ref 6–20)
CO2: 29 mmol/L (ref 22–32)
Calcium: 8.9 mg/dL (ref 8.9–10.3)
Chloride: 95 mmol/L — ABNORMAL LOW (ref 98–111)
Creatinine, Ser: 2.28 mg/dL — ABNORMAL HIGH (ref 0.44–1.00)
GFR, Estimated: 26 mL/min — ABNORMAL LOW (ref >60)
Glucose, Bld: 92 mg/dL (ref 70–99)
Potassium: 3.1 mmol/L — ABNORMAL LOW (ref 3.5–5.1)
Sodium: 135 mmol/L (ref 135–145)

## 2021-06-27 MED ORDER — POTASSIUM CHLORIDE CRYS ER 20 MEQ PO TBCR
40.0000 meq | EXTENDED_RELEASE_TABLET | Freq: Three times a day (TID) | ORAL | Status: DC
  Administered 2021-06-27 – 2021-06-28 (×5): 40 meq via ORAL
  Filled 2021-06-27 (×5): qty 2

## 2021-06-27 NOTE — Progress Notes (Signed)
Patient Demographics:    Lauren Osborn, is a 45 y.o. female, DOB - 01-Dec-1975, VEL:[id number]  Admit date - 06/23/2021   Admitting Physician Ejiroghene Arlyce Dice, MD  Outpatient Primary MD for the patient is Lauren Fire, MD  LOS - 4   Chief Complaint  Patient presents with   Shortness of Breath        Subjective:    [facility] afebrile, no chest pain, no nausea, no vomiting.  Expressed dyspnea on exertion and mild orthopnea.  Still with fluid overload symptoms/anasarca appreciated on exam.  Overall making progress and improving.    Assessment  & Plan :    Principal Problem:   Anasarca Active Problems:   Hypokalemia   Prolonged QT interval   Acute on chronic heart failure with preserved ejection fraction (HFpEF) (HCC)   Morbid obesity with BMI of 50.0-59.9, adult (HCC)   OSA (obstructive sleep apnea)   CKD (chronic kidney disease), stage IV [facility])  Brief summary 45 y.o. female with medical history significant for dCHF, OSA, hypertension, gout, Morbid obesity admitted on 06/23/2021 with CHF exacerbation with worsening dyspnea and worsening swelling/anasarca  A/p 1)HFpEF--- patient admitted with acute on chronic diastolic dysfunction CHF, repeat echo with preserved EF >60 % --BNP 357 but this may be underestimated in obese patients with diastolic dysfunction. -Chest x-ray consistent with mild pulmonary edema, fine crackles appreciated on exam and patient demonstrating positive orthopnea symptoms. -On admission weight Chart reflected about 14 pound weight gain from her baseline.  -Patient reported that she was compliant with her medications prior to admission (torsemide 60 mg twice daily and metolazone 2.5 mg 3 times weekly); but expressed sudden indiscretion and noncompliance with diet. -Continue to follow daily weights, strict I's and O's and low-sodium diet. -Continue adjusted dose of IV  Lasix and Follow clinical response. with metolazone as ordered -Continue Aldactone for now. 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  Creatinine today 2.28. -- Continue renally adjusting medications, avoid nephrotoxic agents / dehydration  / hypotension -Lasix dose has been adjusted and will continue to closely follow renal function trend.  Lab Results  Component Value Date   CREATININE 2.28 (H) 06/27/2021   CREATININE 2.20 (H) 06/26/2021   CREATININE 1.93 (H) 06/25/2021   CREATININE 1.95 (H) 06/25/2021     3)Hypokalemia--- magnesium WNL -Low potassium in the setting of diuresis -Continue to follow trend and further replete as needed. -Currently receiving maintenance supplementation.    4)Morbid Obesity/OSA -Low calorie diet, portion control and increase physical activity discussed with patient --Patient admits to-Excessive Pepsi/soda intake--- she appears to get at least 1500 cal from just soda per day  -Body mass index is 54.56 kg/m. -Continue the use of CPAP nightly. -Patient in benefit of outpatient follow-up with bariatric clinic.    5)H/o Prior partial thyroidectomy---  -TSH WNL -Continue to monitor levels intermittently.  Disposition/Need for in-Hospital Stay- patient unable to be discharged at this time due to diastolic CHF exacerbation requiring IV diuresis and monitoring  of electrolytes and renal parameters -Possible discharge home in the next 24-48 hours once improved with IV diuresis  Status is: Inpatient  Remains inpatient appropriate because: Please see disposition above  Disposition: The patient is from: Home              Anticipated d/c is to: Home              Anticipated d/c date is: 1-2 days               Patient currently is not medically stable to d/c.  Barriers: Not Clinically Stable-   Code Status :  -  Code Status: Full Code   Family Communication:   NA (patient is alert, awake and coherent)   Consults  :  na  DVT Prophylaxis  :   - SCDs       Lab Results  Component Value Date   PLT 225 06/25/2021    Inpatient Medications  Scheduled Meds:  enoxaparin (LOVENOX) injection  70 mg Subcutaneous Q24H   furosemide  60 mg Intravenous Q12H   Living Better with Heart Failure Book   Does not apply Once   metolazone  2.5 mg Oral Daily   pantoprazole  40 mg Oral Daily   potassium chloride SA  40 mEq Oral TID   spironolactone  25 mg Oral Daily   Continuous Infusions: PRN Meds:.acetaminophen **OR** acetaminophen, albuterol, polyethylene glycol    Anti-infectives (From admission, onward)    None         Objective:   Vitals:   06/26/21 2306 06/27/21 0500 06/27/21 0535 06/27/21 1251  BP:   103/78 108/77  Pulse: 90  84 92  Resp: 16  15 18   Temp:   97.6 F (36.4 C) 98.2 F (36.8 C)  TempSrc:   Oral Oral  SpO2: 96%  95% 100%  Weight:  (!) 139.7 kg    Height:        Wt Readings from Last 3 Encounters:  06/27/21 (!) 139.7 kg  04/19/21 132 kg  04/13/21 134.7 kg     Intake/Output Summary (Last 24 hours) at 06/27/2021 1706 Last data filed at 06/27/2021 1303 Gross per 24 hour  Intake 1072 ml  Output 2200 ml  Net -1128 ml    Physical Exam General exam: Alert, awake, oriented x 3, reports breathing continues to improve; is still dyspnea on exertion has been expressed.  No significant orthopnea.  Signs of fluid overload appreciated on exam, With positive anasarca and lower extremity swelling. Respiratory system: Decreased breath sounds at the bases; no using accessory muscle.  No requiring oxygen supplementation.  No wheezing. Cardiovascular system:RRR. No rubs or gallops; unable to assess JVD with body habitus. Gastrointestinal system: Abdomen is obese,  nondistended, soft and nontender. No organomegaly or masses felt. Normal bowel sounds heard. Central nervous system: Alert and oriented. No focal neurological deficits. Extremities: No cyanosis or clubbing; 2-3+ edema appreciated bilaterally. Skin: No rashes, no petechiae. Psychiatry: Judgement and insight appear normal. Mood & affect appropriate.     Data Review:   Micro Results Recent Results (from the past 240 hour(s))  Resp Panel by RT-PCR (Flu A&B, Covid) Nasopharyngeal Swab     Status: None   Collection Time: 06/23/21  6:46 PM   Specimen: Nasopharyngeal Swab; Nasopharyngeal(NP) swabs in vial transport medium  Result Value Ref Range Status   SARS Coronavirus 2 by RT PCR NEGATIVE NEGATIVE Final    Comment: (NOTE) SARS-CoV-2 target nucleic acids are NOT DETECTED.  The SARS-CoV-2 RNA is generally detectable in upper respiratory specimens during the acute phase of infection. The lowest concentration of SARS-CoV-2 viral copies this assay can detect is 138 copies/mL. A negative result does not preclude SARS-Cov-2 infection and should not be used as the sole basis for treatment or other patient management decisions. A negative result may occur with  improper specimen collection/handling, submission of specimen other than nasopharyngeal swab, presence of viral mutation(s) within the areas targeted by this assay, and inadequate number of viral copies(<138 copies/mL). A negative result must be combined with clinical observations, patient history, and epidemiological information. The expected result is Negative.  Fact Sheet for Patients:  EntrepreneurPulse.com.au  Fact Sheet for Healthcare Providers:  IncredibleEmployment.be  This test is no t yet approved or cleared by the [place] FDA and  has been authorized for detection and/or diagnosis of SARS-CoV-2 by FDA under an Emergency Use Authorization (EUA). This EUA will remain  in effect  (meaning this test can be used) for the duration of the COVID-19 declaration under Section 564(b)(1) of the Act, 21 U.S.C.section 360bbb-3(b)(1), unless the authorization is terminated  or revoked sooner.       Influenza A by PCR NEGATIVE NEGATIVE Final   Influenza B by PCR NEGATIVE NEGATIVE Final    Comment: (NOTE) The Xpert Xpress SARS-CoV-2/FLU/RSV plus assay is intended as an aid in the diagnosis of influenza from Nasopharyngeal swab specimens and should not be used as a sole basis for treatment. Nasal washings and aspirates are unacceptable for Xpert Xpress SARS-CoV-2/FLU/RSV testing.  Fact Sheet for Patients: EntrepreneurPulse.com.au  Fact Sheet for Healthcare Providers: IncredibleEmployment.be  This test is not yet approved or cleared by the [place] FDA and has been authorized for detection and/or diagnosis of SARS-CoV-2 by FDA under an Emergency Use Authorization (EUA). This EUA will remain in effect (meaning this test can be used) for the duration of the COVID-19 declaration under Section 564(b)(1) of the Act, 21 U.S.C. section 360bbb-3(b)(1), unless the authorization is terminated or revoked.  Performed at [facility], [address], [place], [place] [address]     Radiology Reports DG Chest 2 View  Result Date: 06/23/2021 CLINICAL DATA:  This of breath for 2 weeks, history of prior tobacco use EXAM: CHEST - 2 VIEW COMPARISON:  09/20/2020 FINDINGS: Cardiac shadow is within normal limits. Mild vascular prominence is noted with mild interstitial edema similar to that seen on the prior exam. No focal infiltrate or effusion is noted. No bony abnormality is seen. IMPRESSION: Changes of mild pulmonary edema. Electronically Signed   By: Inez Catalina M.D.   On: 06/23/2021 19:11     CBC Recent Labs  Lab 06/23/21 1337 06/25/21 0433  WBC 6.6 6.1  HGB 12.2 11.1*  HCT 37.4 34.8*  PLT 260 225  MCV 95.9 96.9  MCH 31.3 30.9  MCHC  32.6 31.9  RDW 15.9* 16.0*    Chemistries  Recent Labs  Lab 06/23/21 1807 06/24/21 0403 06/25/21 0433 06/26/21 0427 06/27/21 0402  NA 135 137 136  136 134* 135  K 3.0* 2.9* 3.8  3.8 3.2* 3.1*  CL 93* 95* 96*  98 94* 95*  CO2 27 30 27  26 28 29   GLUCOSE 93 89 83  84 89 92  BUN 32* 32* 32*  33* 35* 37*  CREATININE 1.88* 1.87* 1.93*  1.95* 2.20* 2.28*  CALCIUM 8.7* 9.1 9.2  9.0 9.0 8.9  MG  --  1.9  --   --   --  AST 38  --   --   --   --   ALT 17  --   --   --   --   ALKPHOS 94  --   --   --   --   BILITOT 2.4*  --   --   --   --        Component Value Date/Time   BNP 357.0 (H) 06/23/2021 1807    Barton Dubois M.D on 06/27/2021 at 5:06 PM  Go to [email or web address] - for contact info  [facility] - Office  [phone]

## 2021-06-28 DIAGNOSIS — I5033 Acute on chronic diastolic (congestive) heart failure: Secondary | ICD-10-CM | POA: Diagnosis not present

## 2021-06-28 DIAGNOSIS — E876 Hypokalemia: Secondary | ICD-10-CM | POA: Diagnosis not present

## 2021-06-28 DIAGNOSIS — N184 Chronic kidney disease, stage 4 (severe): Secondary | ICD-10-CM | POA: Diagnosis not present

## 2021-06-28 DIAGNOSIS — R601 Generalized edema: Secondary | ICD-10-CM | POA: Diagnosis not present

## 2021-06-28 LAB — BASIC METABOLIC PANEL
Anion gap: 11 (ref 5–15)
BUN: 39 mg/dL — ABNORMAL HIGH (ref 6–20)
CO2: 28 mmol/L (ref 22–32)
Calcium: 8.8 mg/dL — ABNORMAL LOW (ref 8.9–10.3)
Chloride: 95 mmol/L — ABNORMAL LOW (ref 98–111)
Creatinine, Ser: 2.28 mg/dL — ABNORMAL HIGH (ref 0.44–1.00)
GFR, Estimated: 26 mL/min — ABNORMAL LOW (ref >60)
Glucose, Bld: 92 mg/dL (ref 70–99)
Potassium: 3.1 mmol/L — ABNORMAL LOW (ref 3.5–5.1)
Sodium: 134 mmol/L — ABNORMAL LOW (ref 135–145)

## 2021-06-28 MED ORDER — TORSEMIDE 20 MG PO TABS: ORAL_TABLET | ORAL | 3 refills | Status: DC

## 2021-06-28 MED ORDER — SPIRONOLACTONE 25 MG PO TABS: 25.0000 mg | ORAL_TABLET | Freq: Every day | ORAL | 1 refills | Status: DC

## 2021-06-28 MED ORDER — METOLAZONE 2.5 MG PO TABS: 2.5000 mg | ORAL_TABLET | ORAL | 0 refills | Status: DC

## 2021-06-28 NOTE — Discharge Summary (Signed)
Physician Discharge Summary  Lauren Osborn LYY:[id number] DOB: 1976/07/30 DOA: 06/23/2021  PCP: [name], MD  Admit date: 06/23/2021 Discharge date: 06/28/2021  Time spent: 35 minutes  Recommendations for Outpatient Follow-up:  Repeat basic metabolic panel to evaluate lites and renal function Close monitoring of patient volume status with further adjustment to diuretics as needed. Continue assisting patient with weight loss management and referred to bariatric clinic if required. Reassess blood pressure and further adjust antihypertensive regimen. Make sure patient has follow-up with cardiology service as instructed.   Discharge Diagnoses:  Principal Problem:   Anasarca Active Problems:   Hypokalemia   Prolonged QT interval   Acute on chronic heart failure with preserved ejection fraction (HFpEF) (HCC)   Morbid obesity with BMI of 50.0-59.9, adult (HCC)   OSA (obstructive sleep apnea)   CKD (chronic kidney disease), stage IV ([place])   Discharge Condition: Stable and improved.  Discharged home with instruction to follow-up with PCP and cardiology as an outpatient.  CODE STATUS: Full code.  Diet recommendation: Low-sodium diet, low calorie diet.  History of present illness:  As per H&P written by Dr. Denton Brick on 06/23/2021 Lauren Osborn is a 45 y.o. female with medical history significant for CHF, OSA, hypertension, gout, obesity. Patient presented to the ED with complaints of difficulty breathing, fluid retention of several weeks.  She reports gradual increasing swelling involving her legs up to her thighs abdomen up to her chest.  She reports at night her breasts are swollen also.  Her abdomen feels tight.  Difficulty breathing is present with exertion, And improves with rest.  No chest pain.  She reports compliance with her torsemide 60 mg  twice every day.  Reports no salt intake.   ED Course: Stable vitals.  Creatinine 1.89.  Potassium 3.2.  Sodium 131.  BNP 357.   Chest x-ray with mild pulmonary edema.  EKG sinus rhythm low voltage.  80 mg Lasix x1 given.  Hospitalist to admit.   Hospital Course:  1)HFpEF--- patient admitted with acute on chronic diastolic dysfunction CHF, repeat echo with preserved EF >60 % --BNP 357 but this may be underestimated in obese patients with diastolic dysfunction. -On admission chest x-ray consistent with mild pulmonary edema, fine crackles appreciated on exam and patient demonstrating positive orthopnea symptoms. -On admission weight Chart reflected approximately 14 pound weight gain from her baseline.  -Patient reported that she was compliant with her medications prior to admission; but expressed sudden setting/diet indiscretion. -Patient has been discharged after adequate intravenous diuresis on adjusted dose of torsemide (using 80 mg in the morning and 60 mg at nighttime and).  Also 3 times a week metolazone. -Advised to follow low-sodium diet, maintain adequate hydration and check her weight on daily basis. -Continue daily use of Aldactone. -Outpatient follow-up with cardiology service. -Will recommend close monitoring of patient's electrolytes and renal function.  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   -Creatinine remained stable at 2.28 with aggressive diuresis. -Patient discharged home with adjusted dose of diuretics and instruction to maintain adequate hydration -Repeat basic metabolic panel follow-up visit to assess renal function and stability -Continue to follow electrolytes and further replete as needed -Avoid the use of nephrotoxic agents and contrast.   3)Hypokalemia--- magnesium WNL -Low potassium in the setting of diuresis -Continue to follow trend and further replete as needed. -Currently receiving maintenance supplementation.  4)Morbid Obesity/OSA -Low calorie  diet, portion control and increase physical activity discussed with patient --Patient admits to-Excessive Pepsi/soda intake--- she appears to get at least 1500 cal from just soda per day  -Body mass index is 54.56 kg/m. -Continue the use of CPAP nightly. -Patient will benefit of outpatient follow-up with bariatric clinic.    5)H/o Prior partial thyroidectomy---  -TSH WNL -Continue to monitor levels intermittently.  Procedures: See below for x-ray reports  Consultations: None  Discharge Exam: Vitals:   06/28/21 0530 06/28/21 1252  BP: 113/84 104/74  Pulse: 91 92  Resp: 17 18  Temp: 98.1 F (36.7 C) 98.4 F (36.9 C)  SpO2: (!) 85% 90%    General: No fever, no chest pain, no nausea, no vomiting.  Reports just mild dyspnea on exertion.  No significant orthopnea.  Feeling ready to go home.  Patient is morbidly obese in appearance and is currently in no distress. Cardiovascular: S1 and S2, no rubs, no gallops, unable to assess JVD with body habitus. Respiratory: Improved air movement bilaterally; no wheezing or frank crackles on examination.  No requiring oxygen supplementation. Abdomen: Soft, obese, no tenderness on palpation, positive bowel sounds. Extremities: Chronic 2++ edema appreciated bilaterally.  No cyanosis or clubbing.  (Patient feel legs are back to baseline for her swelling wise).  Discharge Instructions   Discharge Instructions     (HEART FAILURE PATIENTS) Call MD:  Anytime you have any of the following symptoms: 1) 3 pound weight gain in 24 hours or 5 pounds in 1 week 2) shortness of breath, with or without a dry hacking cough 3) swelling in the hands, feet or stomach 4) if you have to sleep on extra pillows at night in order to breathe.   Complete by: As directed    Diet - low sodium heart healthy   Complete by: As directed    Discharge instructions   Complete by: As directed    Take medications as prescribed Continue to follow low-sodium diet and maintain  adequate hydration Check weight on daily basis Arrange follow-up with PCP in 10 days Follow-up with cardiology service as instructed. Continue to follow-up as previously arranged with your nephrology service.      Allergies as of 06/28/2021       Reactions   Oxycodone-acetaminophen Hives, Other (See Comments)        Medication List     TAKE these medications    acetaminophen 500 MG tablet Commonly known as: TYLENOL Take 1,000 mg by mouth every 6 (six) hours as needed for moderate pain or headache.   magnesium oxide 400 MG tablet Commonly known as: MAG-OX TAKE 1 TABLET BY MOUTH ONCE DAILY. What changed: Another medication with the same name was removed. Continue taking this medication, and follow the directions you see here.   metolazone 2.5 MG tablet Commonly known as: ZAROXOLYN Take 1 tablet (2.5 mg total) by mouth See admin instructions. 2.5 mg every M W F What changed:  how much to take how to take this when to take this additional instructions   omeprazole 20 MG capsule Commonly known as: PriLOSEC Take 1 capsule (20 mg total) by mouth 2 (two) times daily. What changed:  when to take this Another medication with the same name was removed. Continue taking this medication, and follow the directions you see here.   potassium chloride SA 20 MEQ tablet Commonly known as: KLOR-CON Take 40 mEq by mouth 3 (three) times daily. Takes 2 additional tablets by mouth when  taking metolazone   spironolactone 25 MG tablet Commonly known as: ALDACTONE Take 1 tablet (25 mg total) by mouth daily. What changed: how much to take   torsemide 20 MG tablet Commonly known as: DEMADEX Take 4 tablets in am and 3 tablets in the evening. What changed:  how much to take how to take this when to take this additional instructions       Allergies  Allergen Reactions   Oxycodone-Acetaminophen Hives and Other (See Comments)    Follow-up Information     [name], MD.  Schedule an appointment as soon as possible for a visit in 10 day(s).   Specialty: Internal Medicine Contact information: [place] [address] [phone]         Satira Sark, MD .   Specialty: Cardiology Contact information: [place] [address] [phone]                 The results of significant diagnostics from this hospitalization (including imaging, microbiology, ancillary and laboratory) are listed below for reference.    Significant Diagnostic Studies: DG Chest 2 View  Result Date: 06/23/2021 CLINICAL DATA:  This of breath for 2 weeks, history of prior tobacco use EXAM: CHEST - 2 VIEW COMPARISON:  09/20/2020 FINDINGS: Cardiac shadow is within normal limits. Mild vascular prominence is noted with mild interstitial edema similar to that seen on the prior exam. No focal infiltrate or effusion is noted. No bony abnormality is seen. IMPRESSION: Changes of mild pulmonary edema. Electronically Signed   By: Inez Catalina M.D.   On: 06/23/2021 19:11    Microbiology: Recent Results (from the past 240 hour(s))  Resp Panel by RT-PCR (Flu A&B, Covid) Nasopharyngeal Swab     Status: None   Collection Time: 06/23/21  6:46 PM   Specimen: Nasopharyngeal Swab; Nasopharyngeal(NP) swabs in vial transport medium  Result Value Ref Range Status   SARS Coronavirus 2 by RT PCR NEGATIVE NEGATIVE Final    Comment: (NOTE) SARS-CoV-2 target nucleic acids are NOT DETECTED.  The SARS-CoV-2 RNA is generally detectable in upper respiratory specimens during the acute phase of infection. The lowest concentration of SARS-CoV-2 viral copies this assay can detect is 138 copies/mL. A negative result does not preclude SARS-Cov-2 infection and should not be used as the sole basis for treatment or other patient management decisions. A negative result may occur with  improper specimen collection/handling, submission of specimen other than  nasopharyngeal swab, presence of viral mutation(s) within the areas targeted by this assay, and inadequate number of viral copies(<138 copies/mL). A negative result must be combined with clinical observations, patient history, and epidemiological information. The expected result is Negative.  Fact Sheet for Patients:  EntrepreneurPulse.com.au  Fact Sheet for Healthcare Providers:  IncredibleEmployment.be  This test is no t yet approved or cleared by the [place] FDA and  has been authorized for detection and/or diagnosis of SARS-CoV-2 by FDA under an Emergency Use Authorization (EUA). This EUA will remain  in effect (meaning this test can be used) for the duration of the COVID-19 declaration under Section 564(b)(1) of the Act, 21 U.S.C.section 360bbb-3(b)(1), unless the authorization is terminated  or revoked sooner.       Influenza A by PCR NEGATIVE NEGATIVE Final   Influenza B by PCR NEGATIVE NEGATIVE Final    Comment: (NOTE) The Xpert Xpress SARS-CoV-2/FLU/RSV plus assay is intended as an aid in the diagnosis of influenza from Nasopharyngeal swab specimens and should not  be used as a sole basis for treatment. Nasal washings and aspirates are unacceptable for Xpert Xpress SARS-CoV-2/FLU/RSV testing.  Fact Sheet for Patients: EntrepreneurPulse.com.au  Fact Sheet for Healthcare Providers: IncredibleEmployment.be  This test is not yet approved or cleared by the [place] FDA and has been authorized for detection and/or diagnosis of SARS-CoV-2 by FDA under an Emergency Use Authorization (EUA). This EUA will remain in effect (meaning this test can be used) for the duration of the COVID-19 declaration under Section 564(b)(1) of the Act, 21 U.S.C. section 360bbb-3(b)(1), unless the authorization is terminated or revoked.  Performed at [facility], [address], [place], [place] [address]       Labs: Basic Metabolic Panel: Recent Labs  Lab 06/23/21 1337 06/23/21 1807 06/24/21 0403 06/25/21 0433 06/26/21 0427 06/27/21 0402 06/28/21 0525  NA 131*   < > 137 136  136 134* 135 134*  K 3.2*   < > 2.9* 3.8  3.8 3.2* 3.1* 3.1*  CL 93*   < > 95* 96*  98 94* 95* 95*  CO2 28   < > 30 27  26 28 29 28   GLUCOSE 106*   < > 89 83  84 89 92 92  BUN 31*   < > 32* 32*  33* 35* 37* 39*  CREATININE 1.89*   < > 1.87* 1.93*  1.95* 2.20* 2.28* 2.28*  CALCIUM 9.0   < > 9.1 9.2  9.0 9.0 8.9 8.8*  MG  --   --  1.9  --   --   --   --   PHOS 3.6  --   --  3.8  --   --   --    < > = values in this interval not displayed.   Liver Function Tests: Recent Labs  Lab 06/23/21 1337 06/23/21 1807 06/25/21 0433  AST  --  38  --   ALT  --  17  --   ALKPHOS  --  94  --   BILITOT  --  2.4*  --   PROT  --  7.1  --   ALBUMIN 3.7 3.7 3.5   CBC: Recent Labs  Lab 06/23/21 1337 06/25/21 0433  WBC 6.6 6.1  HGB 12.2 11.1*  HCT 37.4 34.8*  MCV 95.9 96.9  PLT 260 225   BNP (last 3 results) Recent Labs    02/25/21 1117 03/04/21 1222 06/23/21 1807  BNP 524.0* 543.0* 357.0*    Signed:  Barton Dubois MD.  [facility] 06/28/2021, 4:17 PM

## 2021-06-29 ENCOUNTER — Telehealth: Payer: Self-pay

## 2021-06-29 NOTE — Telephone Encounter (Signed)
Transition Care Management Unsuccessful Follow-up Telephone Call  Date of discharge and from where:  06/28/2021 from [facility]  Attempts:  1st Attempt  Reason for unsuccessful TCM follow-up call:  Left voice message

## 2021-06-30 NOTE — Telephone Encounter (Signed)
Transition Care Management Unsuccessful Follow-up Telephone Call  Date of discharge and from where:  06/28/2021-   Attempts:  2nd Attempt  Reason for unsuccessful TCM follow-up call:  Left voice message

## 2021-07-01 NOTE — Telephone Encounter (Signed)
Transition Care Management Unsuccessful Follow-up Telephone Call  Date of discharge and from where:  06/28/2021-[facility]   Attempts:  3rd Attempt  Reason for unsuccessful TCM follow-up call:  Left voice message

## 2021-07-14 ENCOUNTER — Other Ambulatory Visit: Payer: Self-pay

## 2021-07-14 ENCOUNTER — Other Ambulatory Visit (HOSPITAL_COMMUNITY)
Admission: RE | Admit: 2021-07-14 | Discharge: 2021-07-14 | Disposition: A | Discharge status: Home / Self Care | Payer: Managed Care, Other (non HMO) | Source: Ambulatory Visit | Attending: Student | Admitting: Student

## 2021-07-14 ENCOUNTER — Ambulatory Visit (INDEPENDENT_AMBULATORY_CARE_PROVIDER_SITE_OTHER): Payer: Managed Care, Other (non HMO) | Admitting: Student

## 2021-07-14 ENCOUNTER — Encounter: Payer: Self-pay | Admitting: Student

## 2021-07-14 VITALS — BP 100/78 | HR 86 | Ht 63.0 in | Wt 302.0 lb

## 2021-07-14 DIAGNOSIS — N1832 Chronic kidney disease, stage 3b: Secondary | ICD-10-CM

## 2021-07-14 DIAGNOSIS — I5032 Chronic diastolic (congestive) heart failure: Secondary | ICD-10-CM | POA: Insufficient documentation

## 2021-07-14 DIAGNOSIS — R1011 Right upper quadrant pain: Secondary | ICD-10-CM | POA: Diagnosis not present

## 2021-07-14 DIAGNOSIS — Z79899 Other long term (current) drug therapy: Secondary | ICD-10-CM

## 2021-07-14 DIAGNOSIS — G4733 Obstructive sleep apnea (adult) (pediatric): Secondary | ICD-10-CM | POA: Diagnosis not present

## 2021-07-14 LAB — BASIC METABOLIC PANEL
Anion gap: 12 (ref 5–15)
BUN: 35 mg/dL — ABNORMAL HIGH (ref 6–20)
CO2: 30 mmol/L (ref 22–32)
Calcium: 9.3 mg/dL (ref 8.9–10.3)
Chloride: 92 mmol/L — ABNORMAL LOW (ref 98–111)
Creatinine, Ser: 1.99 mg/dL — ABNORMAL HIGH (ref 0.44–1.00)
GFR, Estimated: 31 mL/min — ABNORMAL LOW (ref >60)
Glucose, Bld: 92 mg/dL (ref 70–99)
Potassium: 2.9 mmol/L — ABNORMAL LOW (ref 3.5–5.1)
Sodium: 134 mmol/L — ABNORMAL LOW (ref 135–145)

## 2021-07-14 LAB — MAGNESIUM: Magnesium: 1.9 mg/dL (ref 1.7–2.4)

## 2021-07-14 NOTE — Progress Notes (Addendum)
Cardiology Office Note    Date:  07/14/2021   ID:  Lauren Osborn, DOB 1975/09/10, MRN [id number]  PCP:  [name], MD  Cardiologist: Rozann Lesches, MD    Chief Complaint  Patient presents with   Follow-up    3 month visit    History of Present Illness:    Lauren Osborn is a 45 y.o. female with past medical history of HFpEF, GERD and severe OSA who presents to the office today for 82-month follow-up.  She was examined by myself in 04/2021 and reported still having baseline dyspnea on exertion and lower extremity edema but her weight had declined by 10 pounds since her prior visit.  She was utilizing her CPAP and was able to tolerate for up to 4 hours at a time. She was continued on Torsemide 60 mg twice daily and encouraged to take half of her Metolazone tablet every other day.  In the interim, she was admitted to [facility] from 10/20 - 06/28/2021 for acute on chronic CHF exacerbation. BNP was elevated to 357 on admission and CXR was consistent with CHF. She was discharged on Torsemide 80 mg in AM/60 mg in PM and Metolazone 2.5 mg 3 times weekly. Her weight was at 308 lbs on the day of discharge and creatinine was at 2.28 on 06/28/2021. It appears she did see Dr. Theador Hawthorne the following day in the outpatient setting and was continued on her current diuretic regimen.  In talking with the patient today, she reports her volume status did not improve during her admission. Still with significant dyspnea on exertion, abdominal distension and lower extremity edema. Her weight has declined by 6 lbs since discharge but still above baseline of 270's to 280's. Reports limiting her salt intake and tries to only consume a few bottles of water daily. No recent chest pain or palpitations.   She has been experiencing significant RUQ pain for the past month and is typically worse with food consumption or consuming water. No vomiting, diarrhea or constipation. LFT's were checked during her recent  admission and WNL. Scheduled to see GI next week.    Past Medical History:  Diagnosis Date   Diastolic congestive heart failure (HCC)    Essential hypertension    Fibroids    GERD (gastroesophageal reflux disease)    Gout    Morbid obesity ([place])    Thyroid nodule     Past Surgical History:  Procedure Laterality Date   BIOPSY  08/03/2020   Procedure: BIOPSY;  Surgeon: Eloise Harman, DO;  [facility];  Service: Endoscopy;;  duodenal gastric   CESAREAN SECTION     COLONOSCOPY WITH PROPOFOL N/A 08/03/2020    Surgeon: Hurshel Keys K, DO; 5 mm tubular adenoma in the sigmoid colon, nonbleeding internal hemorrhoids.  Recommended repeat in 5 years.   ESOPHAGOGASTRODUODENOSCOPY (EGD) WITH PROPOFOL N/A 08/03/2020   Surgeon: Eloise Harman, DO; LA grade C esophagitis without bleeding, gastritis with erythema and shallow ulcerations in gastric antrum biopsied (negative for H. pylori), normal examined duodenum s/p biopsy (benign).   HYSTERECTOMY ABDOMINAL WITH SALPINGECTOMY  2008   POLYPECTOMY  08/03/2020   Procedure: POLYPECTOMY;  Surgeon: Eloise Harman, DO;  [facility];  Service: Endoscopy;;  colon   THYROIDECTOMY Right 10/21/2018   Procedure: RIGHT HEMITHYROIDECTOMY;  Surgeon: Leta Baptist, MD;  Location: [place];  Service: ENT;  Laterality: Right;    Current Medications: Outpatient Medications Prior to Visit  Medication Sig  Dispense Refill   acetaminophen (TYLENOL) 500 MG tablet Take 1,000 mg by mouth every 6 (six) hours as needed for moderate pain or headache.     magnesium oxide (MAG-OX) 400 MG tablet TAKE 1 TABLET BY MOUTH ONCE DAILY. 30 tablet 6   metolazone (ZAROXOLYN) 2.5 MG tablet Take 1 tablet (2.5 mg total) by mouth See admin instructions. 2.5 mg every M W F 30 tablet 0   omeprazole (PRILOSEC) 20 MG capsule Take 1 capsule (20 mg total) by mouth 2 (two) times daily. (Patient taking differently: Take 20 mg by mouth daily.) 60  capsule 5   potassium chloride SA (KLOR-CON) 20 MEQ tablet Take 40 mEq by mouth 3 (three) times daily. Takes 2 additional tablets by mouth when taking metolazone     spironolactone (ALDACTONE) 25 MG tablet Take 1 tablet (25 mg total) by mouth daily. 30 tablet 1   torsemide (DEMADEX) 20 MG tablet Take 4 tablets in am and 3 tablets in the evening. 540 tablet 3   No facility-administered medications prior to visit.     Allergies:   Oxycodone-acetaminophen   Social History   Socioeconomic History   Marital status: Single    Spouse name: Not on file   Number of children: Not on file   Years of education: Not on file   Highest education level: Not on file  Occupational History   Not on file  Tobacco Use   Smoking status: Former    Packs/day: 0.50    Years: 20.00    Pack years: 10.00    Types: Cigarettes    Quit date: 2017    Years since quitting: 5.8   Smokeless tobacco: Never  Vaping Use   Vaping Use: Never used  Substance and Sexual Activity   Alcohol use: Not Currently   Drug use: Never   Sexual activity: Yes    Comment: hyst  Other Topics Concern   Not on file  Social History Narrative   Not on file   Social Determinants of Health   Financial Resource Strain: Not on file  Food Insecurity: Not on file  Transportation Needs: Not on file  Physical Activity: Not on file  Stress: Not on file  Social Connections: Not on file     Family History:  The patient's family history includes Breast cancer in her mother; Colon cancer (age of onset: 55) in her father; Colon polyps (age of onset: 18) in her sister; Congestive Heart Failure in her maternal grandfather; Diabetes in her daughter; Diverticulitis in her brother and sister; Other in her maternal grandmother and paternal grandfather.   Review of Systems:    Please see the history of present illness.     All other systems reviewed and are otherwise negative except as noted above.   Physical Exam:    VS:  BP 100/78    Pulse 86   Ht 5\' 3"  (1.6 m)   Wt (!) 302 lb (137 kg)   SpO2 96%   BMI 53.50 kg/m    General: Pleasant obese female appearing in no acute distress. Head: Normocephalic, atraumatic. Neck: No carotid bruits. JVD difficult to assess due to body habitus.  Lungs: Respirations regular and unlabored., Mild rales along left base.  Heart: Regular rate and rhythm. No S3 or S4.  No murmur, no rubs, or gallops appreciated. Abdomen: Appears distended. No obvious abdominal masses. Msk:  Strength and tone appear normal for age. No obvious joint deformities or effusions. Extremities: No clubbing or cyanosis. 2+  pitting edema bilaterally.  Distal pedal pulses are 2+ bilaterally. Neuro: Alert and oriented X 3. Moves all extremities spontaneously. No focal deficits noted. Psych:  Responds to questions appropriately with a normal affect. Skin: No rashes or lesions noted  Wt Readings from Last 3 Encounters:  07/14/21 (!) 302 lb (137 kg)  06/28/21 (!) 308 lb (139.7 kg)  04/19/21 291 lb 1.3 oz (132 kg)     Studies/Labs Reviewed:   EKG:  EKG is not ordered today.   Recent Labs: 09/17/2020: TSH 4.037 06/23/2021: ALT 17; B Natriuretic Peptide 357.0 06/25/2021: Hemoglobin 11.1; Platelets 225 07/14/2021: BUN 35; Creatinine, Ser 1.99; Magnesium 1.9; Potassium 2.9; Sodium 134   Lipid Panel No results found for: CHOL, TRIG, HDL, CHOLHDL, VLDL, LDLCALC, LDLDIRECT  Additional studies/ records that were reviewed today include:   Echocardiogram: 03/2021 IMPRESSIONS     1. Left ventricular ejection fraction, by estimation, is 60 to 65%. The  left ventricle has normal function. The left ventricle has no regional  wall motion abnormalities. Left ventricular diastolic parameters were  normal. There is the interventricular  septum is flattened in diastole ('D' shaped left ventricle), consistent  with right ventricular volume overload.   2. Right ventricular systolic function is normal. The right ventricular   size is mildly enlarged. There is normal pulmonary artery systolic  pressure. The estimated right ventricular systolic pressure is 93.7 mmHg.   3. Left atrial size was upper normal.   4. The mitral valve is grossly normal. Trivial mitral valve  regurgitation.   5. The aortic valve is tricuspid. Aortic valve regurgitation is not  visualized.   6. The inferior vena cava is normal in size with <50% respiratory  variability, suggesting right atrial pressure of 8 mmHg.   Assessment:    1. Chronic diastolic (congestive) heart failure (HCC)   2. Stage 3b chronic kidney disease ([place])   3. OSA (obstructive sleep apnea)   4. Abdominal pain, RUQ   5. Medication management      Plan:   In order of problems listed above:  1. HFpEF - She continues to experience weight gain and is volume overloaded on examination today as symptoms did not improve during her admission. Weight at 302 lbs and prior baseline of 270's to 280's.  - She has been taking Torsemide 80mg  in AM/60mg  in PM and Metolazone 2.5mg  three times weekly. Was evaluated by Nephrology with no change in medications but is not scheduled for repeat labs until the end of this month. Will recheck BMET today given the frequent use of Metolazone. Also remains on Spironolactone 25mg  daily. Have not started an SGLT-2 inhibitor given her variable renal function.  - As previously discussed with Dr. Domenic Polite, will refer to Advanced Heart Failure to see if they have any additional recommendations for her treatment and if a RHC is indicated. Most recent echo showed RV volume overload but RV function was normal and PASP was normal. Not a candidate for LHC given her CKD.   2. Stage 3 CKD - Creatinine was at 2.28 upon hospital discharge and she was hypokalemic with K+ at 3.1. Will recheck BMET and Mg today. Followed by Dr. Theador Hawthorne.   3. OSA - She has been able to use her CPAP throughout the night over the past few weeks. Continued compliance encouraged.    4. RUQ Pain - She does report worsening RUQ pain with food consumption over the past several weeks. LFT's recently checked and WNL. Abdominal MRI earlier this year showed  a benign hemangioma along the hepatic dome and cholelithiasis. Will plan for a RUQ [place]. She is scheduled to see GI next week and will make them aware.    Medication Adjustments/Labs and Tests Ordered: Current medicines are reviewed at length with the patient today.  Concerns regarding medicines are outlined above.  Medication changes, Labs and Tests ordered today are listed in the Patient Instructions below. Patient Instructions  Medication Instructions:   Continue current medications for now. May need to change doses pending your lab results.   Labwork:  BMET and Magnesium today.   Testing/Procedures:  Ultrasound of the Right Upper Quadrant of your abdomen.   Follow-Up:  Will refer to the [facility] in [place].  Follow-up with Bernerd Pho, PA-C or Dr. Domenic Polite in 6-8 weeks.   Any Other Special Instructions Will Be Listed Below (If Applicable).     If you need a refill on your cardiac medications before your next appointment, please call your pharmacy.   Signed, Erma Heritage, PA-C  07/14/2021 7:28 PM    [facility] [address] [place], [place] [address] Phone: [phone] Fax: [phone]

## 2021-07-14 NOTE — Patient Instructions (Signed)
Medication Instructions:   Continue current medications for now. May need to change doses pending your lab results.   Labwork:  BMET and Magnesium today.   Testing/Procedures:  Ultrasound of the Right Upper Quadrant of your abdomen.   Follow-Up:  Will refer to the [facility] in [place].  Follow-up with Bernerd Pho, PA-C or Dr. Domenic Polite in 6-8 weeks.   Any Other Special Instructions Will Be Listed Below (If Applicable).     If you need a refill on your cardiac medications before your next appointment, please call your pharmacy.

## 2021-07-15 ENCOUNTER — Telehealth: Payer: Self-pay | Admitting: *Deleted

## 2021-07-15 ENCOUNTER — Telehealth: Payer: Self-pay

## 2021-07-15 DIAGNOSIS — Z79899 Other long term (current) drug therapy: Secondary | ICD-10-CM

## 2021-07-15 MED ORDER — POTASSIUM CHLORIDE CRYS ER 20 MEQ PO TBCR: 60.0000 meq | EXTENDED_RELEASE_TABLET | Freq: Three times a day (TID) | ORAL | 3 refills | Status: DC

## 2021-07-15 NOTE — Telephone Encounter (Signed)
Pt notified and orders placed  

## 2021-07-15 NOTE — Telephone Encounter (Signed)
Results and instructions already discussed with patient.Note closed

## 2021-07-15 NOTE — Telephone Encounter (Signed)
-----   Message from Erma Heritage, [place] sent at 07/15/2021  7:31 AM EST ----- Can you change the BMET to a CMET in 7-10 days so we can check her liver function as well?   Thanks,  [place]   ----- Message ----- From: Waynetta Pean Sent: 07/14/2021   4:42 PM EST To: Marikay Alar Natalina Wieting, LPN, Bernita Raisin, RN  Please let the patient know that her kidney function has actually improved since her hospitalization with creatinine going from 2.28 down to 1.99. Her potassium however has further declined and is now at 2.9. I would recommend that she titrate K-dur to 60 mEq TID and continue to take the extra doses when she takes Metolazone.  Can continue with Metolazone 3 times weekly given the overall improvement in her kidney function and would continue with Torsemide at current dosing for now since her weight has declined since her hospitalization. Repeat BMET in 7-10 days.

## 2021-07-15 NOTE — Telephone Encounter (Signed)
-----   Message from Erma Heritage, [place] sent at 07/14/2021  4:42 PM EST ----- Please let the patient know that her kidney function has actually improved since her hospitalization with creatinine going from 2.28 down to 1.99. Her potassium however has further declined and is now at 2.9. I would recommend that she titrate K-dur to 60 mEq TID and continue to take the extra doses when she takes Metolazone.  Can continue with Metolazone 3 times weekly given the overall improvement in her kidney function and would continue with Torsemide at current dosing for now since her weight has declined since her hospitalization. Repeat BMET in 7-10 days.

## 2021-07-20 ENCOUNTER — Telehealth (HOSPITAL_COMMUNITY): Payer: Managed Care, Other (non HMO)

## 2021-07-20 NOTE — Telephone Encounter (Signed)
Patient has been referred to the office and would like to schedule an appointment.

## 2021-07-21 NOTE — Progress Notes (Signed)
Referring Provider: [name], MD Primary Care Physician:  [name], MD Primary GI Physician: Dr. Abbey Chatters  Chief Complaint  Patient presents with   Anemia   Gastroesophageal Reflux    Ok, took last Omeprazole today   Abdominal Pain    Across upper ab    HPI:   Lauren Osborn is a 45 y.o. female presenting today for follow-up of anemia. Also with complaint of RUQ abdominal pain.   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 pylori, benign duodenal biopsy, 5 mm tubular adenoma with recommendations to repeat colonoscopy in 5 years.  Recommendations to consider capsule study if anemia does not improve with iron supplementation and treatment of gastritis/ulcers.  Last seen in our office 12/01/2020.  From a GI standpoint, she was doing fairly well.  GERD was well controlled on omeprazole 20 mg twice daily.  She was taking iron twice daily.  Denied overt GI bleeding or any other obvious blood loss.  No other significant GI symptoms.  Denied NSAIDs.  She was still recovering from recent admission in January with anasarca. Evidence of anasarca on exam.  Cardiology was managing diuretics and monitoring closely due to worsening kidney function and hypokalemia.  I also noted history of mildly elevated bilirubin with normal liver enzymes as well as liver lesion on ultrasound, likely hemangioma, but ultrasound not definitive.  Plan to update labs, obtain MRI liver, continue omeprazole BID, and continue iron.   Labs completed revealing hemoglobin of 12.9, iron panel within normal limits.  Recommended discontinuing iron.  Total bilirubin remained elevated at 3.4, but primarily indirect (2.7).   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  MRI revealed benign hemangioma, no further characterization required.  In the interim, patient was admitted 10/20-10/25 with anasarca in the setting of acute on chronic diastolic CHF.  She was diuresed and her home torsemide was adjusted to 80 mg in the morning and 60 mg in the evening and 3 times per week metolazone added.  Aldactone continued.  Follow-up with cardiology 11/10.  She continued with volume overload.  Plan to check a BMP and refer to advanced heart failure clinic.  She also reported RUQ pain with food consumption over the past several weeks. Planned for RUQ ultrasound, currently scheduled for 11/21.  Today: She reports a few week history of intermittent sharp RUQ abdominal pain.  Initially, it was only after she ate, felt like someone was stabbing her in her right upper quadrant.  Now, symptoms seem to be spreading across her upper abdomen to her epigastric area and slightly to the left upper quadrant.  She has a constant nagging pain, but continues with sharp pain after eating, lasting about 45 minutes or so.  Typical GERD symptoms are well controlled on omeprazole 20 mg daily, took her last pill this morning.  She cannot remember when she decreased to once daily.  Denies nausea, vomiting, dysphagia. Denies black stools.  Admits to occasional toilet tissue hematochezia with known history of hemorrhoids occurring 1-2 times a week.  Denies rectal pain.  She is using a medicated hemorrhoid wipes as needed which works fairly well. BMs a couple times a day. No straining. Stools are soft.   Anemia: Most recent hemoglobin 11.1 with normocytic  indices during hospitalization on 06/25/2021 though hemoglobin on admission (10/20) was 12.2.  NSAIDs- None.  Iron- none  Continues to struggle with fluid overload. Weighting 310 lbs today. Gained 3 lbs this week. Plans to call cardiology when she leaves our office. Worsening SOB. States going to the hospital previously  wasn't helpful and that she will not go back unless [place] tells her she has to. Denies CP.   Past Medical History:  Diagnosis Date   Diastolic congestive heart failure (HCC)    Essential hypertension    Fibroids    GERD (gastroesophageal reflux disease)    Gout    Morbid obesity ([place])    Thyroid nodule     Past Surgical History:  Procedure Laterality Date   BIOPSY  08/03/2020   Procedure: BIOPSY;  Surgeon: Eloise Harman, DO;  [facility];  Service: Endoscopy;;  duodenal gastric   CESAREAN SECTION     COLONOSCOPY WITH PROPOFOL N/A 08/03/2020    Surgeon: Hurshel Keys K, DO; 5 mm tubular adenoma in the sigmoid colon, nonbleeding internal hemorrhoids.  Recommended repeat in 5 years.   ESOPHAGOGASTRODUODENOSCOPY (EGD) WITH PROPOFOL N/A 08/03/2020   Surgeon: Eloise Harman, DO; LA grade C esophagitis without bleeding, gastritis with erythema and shallow ulcerations in gastric antrum biopsied (negative for H. pylori), normal examined duodenum s/p biopsy (benign).   HYSTERECTOMY ABDOMINAL WITH SALPINGECTOMY  2008   POLYPECTOMY  08/03/2020   Procedure: POLYPECTOMY;  Surgeon: Eloise Harman, DO;  [facility];  Service: Endoscopy;;  colon   THYROIDECTOMY Right 10/21/2018   Procedure: RIGHT HEMITHYROIDECTOMY;  Surgeon: Leta Baptist, MD;  Location: [place];  Service: ENT;  Laterality: Right;    Current Outpatient Medications  Medication Sig Dispense Refill   acetaminophen (TYLENOL) 500 MG tablet Take 1,000 mg by mouth every 6 (six) hours as needed for moderate pain or headache.     magnesium oxide (MAG-OX) 400 MG tablet TAKE 1 TABLET BY MOUTH ONCE DAILY. 30 tablet 6   metolazone (ZAROXOLYN) 2.5 MG tablet Take 1 tablet (2.5 mg total) by mouth See admin instructions. 2.5 mg every M W F 30 tablet 0   potassium chloride SA (KLOR-CON) 20 MEQ tablet Take 3 tablets (60 mEq total) by mouth 3 (three) times daily. Take 2 additional Tablets on the Days  That you take Metolazone 882 tablet 3   spironolactone (ALDACTONE) 25 MG tablet Take 1 tablet (25 mg total) by mouth daily. 30 tablet 1   TART CHERRY PO Take by mouth daily.     torsemide (DEMADEX) 20 MG tablet Take 4 tablets in am and 3 tablets in the evening. 540 tablet 3   omeprazole (PRILOSEC) 20 MG capsule Take 1 capsule (20 mg total) by mouth daily before breakfast. 30 capsule 3   No current facility-administered medications for this visit.    Allergies as of 07/22/2021 - Review Complete 07/22/2021  Allergen Reaction Noted   Oxycodone-acetaminophen Hives and Other (See Comments) 10/11/2018    Family History  Problem Relation Age of Onset   Breast cancer Mother    Other Paternal Grandfather        house fire   Other Maternal Grandmother        house fire   Congestive Heart Failure Maternal Grandfather    Colon cancer Father 77   Diverticulitis Brother    Diverticulitis Sister    Colon polyps Sister 44   Diabetes Daughter  borderline    Social History   Socioeconomic History   Marital status: Single    Spouse name: Not on file   Number of children: Not on file   Years of education: Not on file   Highest education level: Not on file  Occupational History   Not on file  Tobacco Use   Smoking status: Former    Packs/day: 0.50    Years: 20.00    Pack years: 10.00    Types: Cigarettes    Quit date: 2017    Years since quitting: 5.8   Smokeless tobacco: Never  Vaping Use   Vaping Use: Never used  Substance and Sexual Activity   Alcohol use: Not Currently   Drug use: Never   Sexual activity: Yes    Comment: hyst  Other Topics Concern   Not on file  Social History Narrative   Not on file   Social Determinants of Health   Financial Resource Strain: Not on file  Food Insecurity: Not on file  Transportation Needs: Not on file  Physical Activity: Not on file  Stress: Not on file  Social Connections: Not on file    Review of Systems: Gen: Denies  fever, chills, cold or flulike symptoms, presyncope, syncope. CV: Denies chest pain, palpitations. Resp: Admits to shortness of breath, no cough. GI: See HPI Heme: See HPI  Physical Exam: BP 114/83   Pulse 95   Temp (!) 97.5 F (36.4 C) (Temporal)   Ht 5\' 3"  (1.6 m)   Wt (!) 310 lb 3.2 oz (140.7 kg)   BMI 54.95 kg/m  General:   Alert and oriented. No distress noted. Pleasant and cooperative. With anasarca.  Head:  Normocephalic and atraumatic. Eyes:  Conjuctiva clear without scleral icterus. Perioral edema.  Heart:  S1, S2 present without murmurs appreciated. Lungs:  Clear to auscultation bilaterally. No wheezes, rales, or rhonchi. No distress.  Abdomen:  +BS. Distended, tense, with 2+ pitting edema in entire abdomen. Moderate TTP in RUQ, mild TTP in epigastric area. No masses appreciated, but exam limited due to abdominal distention.  No rebound or guarding. Msk:  Symmetrical without gross deformities. Normal posture. Extremities:  With 3+ pitting edema in her lower extremities.  Neurologic:  Alert and  oriented x4 Psych:  Normal mood and affect.    Assessment: 45 year old female with history of HTN, diastolic CHF, CKD, GERD, Gilbert's syndrome, normocytic anemia, presenting today for follow-up with chief complaint of RUQ abdominal pain.  RUQ abdominal pain:  Few week history of severe postprandial RUQ abdominal pain, now also with constant nagging pain in the RUQ extending towards the epigastric and left upper quadrant region.  Denies nausea, vomiting, melena, fever.  Gallbladder in situ with known history of cholelithiasis.  Also with history of gastritis with shallow ulcerations.  Reports typical GERD symptoms are well controlled on omeprazole 20 mg daily.  Denies NSAIDs.  On exam, she has moderate TTP in RUQ, mild TTP in epigastric area.  Also with significant abdominal distention/anasarca related to CHF.  No evidence of jaundice.  Differentials include cholecystitis,  choledocholithiasis, gastritis, PUD, and less likely pancreatitis.  In the setting of anasarca with significant abdominal distention, will plan for complete abdominal ultrasound rather than RUQ ultrasound only.  We will also update labs.  Further recommendations to follow.  GERD:  Typical GERD symptoms well controlled on omeprazole 20 mg daily.  No dysphagia.  She will continue this dose for now.   Normocytic anemia: Found to have normocytic  anemia in June 2021 with iron low at 21, saturation low at 8%, ferritin elevated at 304. B12 wnl.  Colonoscopy and EGD completed November 2021 revealing grade C esophagitis, gastritis with erythema and shallow ulcerations in the gastric antrum (biopsies negative for H. pylori), duodenal biopsies benign, 5 mm tubular adenoma in the colon.  She took omeprazole 20 mg twice daily for at least 4 months, now once daily.  GERD continues to be well controlled.  Denies NSAIDs. Denies melena.  She does report occasional low-volume toilet tissue hematochezia in the setting of known hemorrhoids.  Denies NSAIDs.  Iron was discontinued in March as hemoglobin and iron had returned to normal.  We will plan to update labs today and continue to monitor.  If she has decline in hemoglobin/iron, would need to consider capsule endoscopy. Of note, CKD is new this year and may also be contributing to her anemia.   Rectal bleeding:  Intermittent low-volume toilet tissue hematochezia in the setting of known hemorrhoids.  Bowels are moving well without straining. Colonoscopy November 2021 with nonbleeding internal hemorrhoids, 5 mm tubular adenoma.  She is due for repeat in 2026.  As her symptoms are fairly well managed with medicated hemorrhoid wipes PRN, advised that she may use Preparation H twice daily x7-10 days when symptoms occur.  She is to call with any worsening symptoms.  Shortness of breath:  Worsening shortness of breath in setting of significant anasarca secondary to diastolic  CHF.  On exam today, she has significant anasarca with pitting edema over her entire body. Despite compliance with her diuretics, she has gained 8 pounds over the last week.  I discussed that I felt she would be best served in the emergency room, but patient states she is not going back to the hospital unless her cardiologist makes her as she felt her last hospitalization was unhelpful.  She plans to call her cardiologist as soon as she leaves our office today.   Plan:  Ultrasound abdomen complete stat. CBC, CMP, lipase, iron panel with ferritin.  Continue omeprazole 20 mg daily. Reinforced GERD diet/lifestyle. Continue medicated hemorrhoid wipes as needed. Preparation H twice daily x7-10 days as needed for rectal bleeding/hemorrhoid symptoms. Avoid straining. Limit toilet time to 2-3 minutes. Continue to avoid NSAIDs. Encouraged emergency room evaluation of anasarca/SOB, but patient declined. She is to call her cardiologist as soon as she leaves our office today.  Further recommendations to follow labs and ultrasound.   Aliene Altes, PA-C [facility] 07/22/2021

## 2021-07-22 ENCOUNTER — Encounter (HOSPITAL_COMMUNITY): Payer: Self-pay

## 2021-07-22 ENCOUNTER — Other Ambulatory Visit (HOSPITAL_COMMUNITY)
Admission: RE | Admit: 2021-07-22 | Discharge: 2021-07-22 | Disposition: A | Discharge status: Home / Self Care | Payer: Managed Care, Other (non HMO) | Source: Ambulatory Visit | Attending: Gastroenterology | Admitting: Gastroenterology

## 2021-07-22 ENCOUNTER — Other Ambulatory Visit: Payer: Self-pay

## 2021-07-22 ENCOUNTER — Encounter: Payer: Self-pay | Admitting: Gastroenterology

## 2021-07-22 ENCOUNTER — Other Ambulatory Visit (HOSPITAL_COMMUNITY)
Admission: RE | Admit: 2021-07-22 | Discharge: 2021-07-22 | Disposition: A | Discharge status: Home / Self Care | Payer: Managed Care, Other (non HMO) | Source: Ambulatory Visit | Attending: Nephrology | Admitting: Nephrology

## 2021-07-22 ENCOUNTER — Inpatient Hospital Stay (HOSPITAL_COMMUNITY)
Admission: EM | Admit: 2021-07-22 | Discharge: 2021-08-20 | DRG: 264 | Disposition: A | Discharge status: Home Health | Payer: Managed Care, Other (non HMO) | Attending: Cardiovascular Disease | Admitting: Cardiovascular Disease

## 2021-07-22 ENCOUNTER — Ambulatory Visit (HOSPITAL_COMMUNITY): Payer: Medicaid Other

## 2021-07-22 ENCOUNTER — Other Ambulatory Visit (HOSPITAL_COMMUNITY)
Admission: RE | Admit: 2021-07-22 | Discharge: 2021-07-22 | Disposition: A | Discharge status: Home / Self Care | Payer: Managed Care, Other (non HMO) | Source: Ambulatory Visit | Attending: Student | Admitting: Student

## 2021-07-22 ENCOUNTER — Ambulatory Visit (INDEPENDENT_AMBULATORY_CARE_PROVIDER_SITE_OTHER): Payer: Managed Care, Other (non HMO) | Admitting: Gastroenterology

## 2021-07-22 ENCOUNTER — Emergency Department (HOSPITAL_COMMUNITY): Payer: Managed Care, Other (non HMO)

## 2021-07-22 ENCOUNTER — Ambulatory Visit (HOSPITAL_COMMUNITY)
Admission: RE | Admit: 2021-07-22 | Discharge: 2021-07-22 | Disposition: A | Discharge status: Home / Self Care | Payer: Managed Care, Other (non HMO) | Source: Ambulatory Visit | Attending: Gastroenterology | Admitting: Gastroenterology

## 2021-07-22 VITALS — BP 114/83 | HR 95 | Temp 97.5°F | Ht 63.0 in | Wt 310.2 lb

## 2021-07-22 DIAGNOSIS — I953 Hypotension of hemodialysis: Secondary | ICD-10-CM | POA: Diagnosis not present

## 2021-07-22 DIAGNOSIS — K8020 Calculus of gallbladder without cholecystitis without obstruction: Secondary | ICD-10-CM | POA: Diagnosis present

## 2021-07-22 DIAGNOSIS — G4733 Obstructive sleep apnea (adult) (pediatric): Secondary | ICD-10-CM | POA: Diagnosis not present

## 2021-07-22 DIAGNOSIS — Z515 Encounter for palliative care: Secondary | ICD-10-CM | POA: Diagnosis not present

## 2021-07-22 DIAGNOSIS — T80219A Unspecified infection due to central venous catheter, initial encounter: Secondary | ICD-10-CM

## 2021-07-22 DIAGNOSIS — E875 Hyperkalemia: Secondary | ICD-10-CM | POA: Diagnosis present

## 2021-07-22 DIAGNOSIS — Z20822 Contact with and (suspected) exposure to covid-19: Secondary | ICD-10-CM | POA: Diagnosis present

## 2021-07-22 DIAGNOSIS — I509 Heart failure, unspecified: Secondary | ICD-10-CM | POA: Diagnosis not present

## 2021-07-22 DIAGNOSIS — R188 Other ascites: Secondary | ICD-10-CM | POA: Diagnosis not present

## 2021-07-22 DIAGNOSIS — L299 Pruritus, unspecified: Secondary | ICD-10-CM | POA: Diagnosis present

## 2021-07-22 DIAGNOSIS — I2729 Other secondary pulmonary hypertension: Secondary | ICD-10-CM | POA: Diagnosis not present

## 2021-07-22 DIAGNOSIS — N186 End stage renal disease: Secondary | ICD-10-CM | POA: Diagnosis present

## 2021-07-22 DIAGNOSIS — R601 Generalized edema: Secondary | ICD-10-CM

## 2021-07-22 DIAGNOSIS — N184 Chronic kidney disease, stage 4 (severe): Secondary | ICD-10-CM | POA: Diagnosis not present

## 2021-07-22 DIAGNOSIS — R7989 Other specified abnormal findings of blood chemistry: Secondary | ICD-10-CM | POA: Diagnosis present

## 2021-07-22 DIAGNOSIS — D631 Anemia in chronic kidney disease: Secondary | ICD-10-CM | POA: Diagnosis present

## 2021-07-22 DIAGNOSIS — R1011 Right upper quadrant pain: Secondary | ICD-10-CM

## 2021-07-22 DIAGNOSIS — I5082 Biventricular heart failure: Secondary | ICD-10-CM | POA: Diagnosis not present

## 2021-07-22 DIAGNOSIS — Z6841 Body Mass Index (BMI) 40.0 and over, adult: Secondary | ICD-10-CM

## 2021-07-22 DIAGNOSIS — N1832 Chronic kidney disease, stage 3b: Secondary | ICD-10-CM | POA: Diagnosis not present

## 2021-07-22 DIAGNOSIS — I5033 Acute on chronic diastolic (congestive) heart failure: Secondary | ICD-10-CM | POA: Diagnosis present

## 2021-07-22 DIAGNOSIS — E876 Hypokalemia: Secondary | ICD-10-CM | POA: Insufficient documentation

## 2021-07-22 DIAGNOSIS — D638 Anemia in other chronic diseases classified elsewhere: Secondary | ICD-10-CM | POA: Insufficient documentation

## 2021-07-22 DIAGNOSIS — E8779 Other fluid overload: Secondary | ICD-10-CM | POA: Diagnosis not present

## 2021-07-22 DIAGNOSIS — I129 Hypertensive chronic kidney disease with stage 1 through stage 4 chronic kidney disease, or unspecified chronic kidney disease: Secondary | ICD-10-CM | POA: Insufficient documentation

## 2021-07-22 DIAGNOSIS — Z992 Dependence on renal dialysis: Secondary | ICD-10-CM

## 2021-07-22 DIAGNOSIS — R34 Anuria and oliguria: Secondary | ICD-10-CM | POA: Diagnosis not present

## 2021-07-22 DIAGNOSIS — R0602 Shortness of breath: Secondary | ICD-10-CM

## 2021-07-22 DIAGNOSIS — M7989 Other specified soft tissue disorders: Secondary | ICD-10-CM

## 2021-07-22 DIAGNOSIS — D649 Anemia, unspecified: Secondary | ICD-10-CM | POA: Diagnosis present

## 2021-07-22 DIAGNOSIS — E559 Vitamin D deficiency, unspecified: Secondary | ICD-10-CM | POA: Diagnosis not present

## 2021-07-22 DIAGNOSIS — K219 Gastro-esophageal reflux disease without esophagitis: Secondary | ICD-10-CM | POA: Diagnosis not present

## 2021-07-22 DIAGNOSIS — Z79899 Other long term (current) drug therapy: Secondary | ICD-10-CM

## 2021-07-22 DIAGNOSIS — K2100 Gastro-esophageal reflux disease with esophagitis, without bleeding: Secondary | ICD-10-CM | POA: Diagnosis not present

## 2021-07-22 DIAGNOSIS — Z7189 Other specified counseling: Secondary | ICD-10-CM | POA: Diagnosis not present

## 2021-07-22 DIAGNOSIS — N179 Acute kidney failure, unspecified: Secondary | ICD-10-CM | POA: Diagnosis not present

## 2021-07-22 DIAGNOSIS — K7460 Unspecified cirrhosis of liver: Secondary | ICD-10-CM | POA: Diagnosis present

## 2021-07-22 DIAGNOSIS — K625 Hemorrhage of anus and rectum: Secondary | ICD-10-CM | POA: Diagnosis not present

## 2021-07-22 DIAGNOSIS — E871 Hypo-osmolality and hyponatremia: Secondary | ICD-10-CM | POA: Insufficient documentation

## 2021-07-22 DIAGNOSIS — I5043 Acute on chronic combined systolic (congestive) and diastolic (congestive) heart failure: Secondary | ICD-10-CM | POA: Diagnosis present

## 2021-07-22 DIAGNOSIS — I132 Hypertensive heart and chronic kidney disease with heart failure and with stage 5 chronic kidney disease, or end stage renal disease: Principal | ICD-10-CM | POA: Diagnosis present

## 2021-07-22 DIAGNOSIS — R0600 Dyspnea, unspecified: Secondary | ICD-10-CM | POA: Diagnosis not present

## 2021-07-22 DIAGNOSIS — K760 Fatty (change of) liver, not elsewhere classified: Secondary | ICD-10-CM | POA: Diagnosis not present

## 2021-07-22 DIAGNOSIS — Z90710 Acquired absence of both cervix and uterus: Secondary | ICD-10-CM

## 2021-07-22 DIAGNOSIS — D1803 Hemangioma of intra-abdominal structures: Secondary | ICD-10-CM | POA: Diagnosis not present

## 2021-07-22 DIAGNOSIS — E890 Postprocedural hypothyroidism: Secondary | ICD-10-CM | POA: Diagnosis present

## 2021-07-22 DIAGNOSIS — Z8249 Family history of ischemic heart disease and other diseases of the circulatory system: Secondary | ICD-10-CM

## 2021-07-22 DIAGNOSIS — N183 Chronic kidney disease, stage 3 unspecified: Secondary | ICD-10-CM | POA: Diagnosis not present

## 2021-07-22 DIAGNOSIS — Z87891 Personal history of nicotine dependence: Secondary | ICD-10-CM

## 2021-07-22 DIAGNOSIS — M109 Gout, unspecified: Secondary | ICD-10-CM | POA: Diagnosis present

## 2021-07-22 DIAGNOSIS — R109 Unspecified abdominal pain: Secondary | ICD-10-CM

## 2021-07-22 DIAGNOSIS — T502X5A Adverse effect of carbonic-anhydrase inhibitors, benzothiadiazides and other diuretics, initial encounter: Secondary | ICD-10-CM | POA: Diagnosis not present

## 2021-07-22 DIAGNOSIS — I5031 Acute diastolic (congestive) heart failure: Secondary | ICD-10-CM | POA: Diagnosis not present

## 2021-07-22 DIAGNOSIS — Z885 Allergy status to narcotic agent status: Secondary | ICD-10-CM

## 2021-07-22 HISTORY — DX: Hemangioma of intra-abdominal structures: D18.03

## 2021-07-22 HISTORY — DX: Esophagitis, unspecified without bleeding: K20.90

## 2021-07-22 HISTORY — DX: Anemia, unspecified: D64.9

## 2021-07-22 HISTORY — DX: Gastritis, unspecified, without bleeding: K29.70

## 2021-07-22 HISTORY — DX: Obstructive sleep apnea (adult) (pediatric): G47.33

## 2021-07-22 HISTORY — DX: Other specified abnormal findings of blood chemistry: R79.89

## 2021-07-22 HISTORY — DX: Calculus of gallbladder without cholecystitis without obstruction: K80.20

## 2021-07-22 HISTORY — DX: Benign neoplasm, unspecified site: D36.9

## 2021-07-22 HISTORY — DX: Chronic kidney disease, stage 3b: N18.32

## 2021-07-22 LAB — LIPASE, BLOOD
Lipase: 41 U/L (ref 11–51)
Lipase: 46 U/L (ref 11–51)

## 2021-07-22 LAB — COMPREHENSIVE METABOLIC PANEL
ALT: 18 U/L (ref 0–44)
ALT: 18 U/L (ref 0–44)
ALT: 18 U/L (ref 0–44)
AST: 38 U/L (ref 15–41)
AST: 38 U/L (ref 15–41)
AST: 40 U/L (ref 15–41)
Albumin: 4.1 g/dL (ref 3.5–5.0)
Albumin: 4.1 g/dL (ref 3.5–5.0)
Albumin: 4 g/dL (ref 3.5–5.0)
Alkaline Phosphatase: 84 U/L (ref 38–126)
Alkaline Phosphatase: 89 U/L (ref 38–126)
Alkaline Phosphatase: 88 U/L (ref 38–126)
Anion gap: 12 (ref 5–15)
Anion gap: 13 (ref 5–15)
Anion gap: 12 (ref 5–15)
BUN: 44 mg/dL — ABNORMAL HIGH (ref 6–20)
BUN: 44 mg/dL — ABNORMAL HIGH (ref 6–20)
BUN: 43 mg/dL — ABNORMAL HIGH (ref 6–20)
CO2: 26 mmol/L (ref 22–32)
CO2: 27 mmol/L (ref 22–32)
CO2: 25 mmol/L (ref 22–32)
Calcium: 9.5 mg/dL (ref 8.9–10.3)
Calcium: 9.7 mg/dL (ref 8.9–10.3)
Calcium: 9.3 mg/dL (ref 8.9–10.3)
Chloride: 96 mmol/L — ABNORMAL LOW (ref 98–111)
Chloride: 98 mmol/L (ref 98–111)
Chloride: 98 mmol/L (ref 98–111)
Creatinine, Ser: 2.75 mg/dL — ABNORMAL HIGH (ref 0.44–1.00)
Creatinine, Ser: 2.76 mg/dL — ABNORMAL HIGH (ref 0.44–1.00)
Creatinine, Ser: 2.76 mg/dL — ABNORMAL HIGH (ref 0.44–1.00)
GFR, Estimated: 21 mL/min — ABNORMAL LOW (ref >60)
GFR, Estimated: 21 mL/min — ABNORMAL LOW (ref >60)
GFR, Estimated: 21 mL/min — ABNORMAL LOW (ref >60)
Glucose, Bld: 91 mg/dL (ref 70–99)
Glucose, Bld: 93 mg/dL (ref 70–99)
Glucose, Bld: 85 mg/dL (ref 70–99)
Potassium: 4.6 mmol/L (ref 3.5–5.1)
Potassium: 4.7 mmol/L (ref 3.5–5.1)
Potassium: 4.9 mmol/L (ref 3.5–5.1)
Sodium: 135 mmol/L (ref 135–145)
Sodium: 137 mmol/L (ref 135–145)
Sodium: 135 mmol/L (ref 135–145)
Total Bilirubin: 2.7 mg/dL — ABNORMAL HIGH (ref 0.3–1.2)
Total Bilirubin: 2.7 mg/dL — ABNORMAL HIGH (ref 0.3–1.2)
Total Bilirubin: 2.4 mg/dL — ABNORMAL HIGH (ref 0.3–1.2)
Total Protein: 7.8 g/dL (ref 6.5–8.1)
Total Protein: 7.9 g/dL (ref 6.5–8.1)
Total Protein: 7.7 g/dL (ref 6.5–8.1)

## 2021-07-22 LAB — PROTIME-INR
INR: 1.2 (ref 0.8–1.2)
Prothrombin Time: 15.1 seconds (ref 11.4–15.2)

## 2021-07-22 LAB — CBC WITH DIFFERENTIAL/PLATELET
Abs Immature Granulocytes: 0.01 10*3/uL (ref 0.00–0.07)
Abs Immature Granulocytes: 0.02 10*3/uL (ref 0.00–0.07)
Abs Immature Granulocytes: 0.02 10*3/uL (ref 0.00–0.07)
Basophils Absolute: 0.1 10*3/uL (ref 0.0–0.1)
Basophils Absolute: 0.1 10*3/uL (ref 0.0–0.1)
Basophils Absolute: 0.1 10*3/uL (ref 0.0–0.1)
Basophils Relative: 1 %
Basophils Relative: 2 %
Basophils Relative: 1 %
Eosinophils Absolute: 0.1 10*3/uL (ref 0.0–0.5)
Eosinophils Absolute: 0.1 10*3/uL (ref 0.0–0.5)
Eosinophils Absolute: 0.1 10*3/uL (ref 0.0–0.5)
Eosinophils Relative: 2 %
Eosinophils Relative: 2 %
Eosinophils Relative: 2 %
HCT: 36.5 % (ref 36.0–46.0)
HCT: 37 % (ref 36.0–46.0)
HCT: 37.1 % (ref 36.0–46.0)
Hemoglobin: 11.8 g/dL — ABNORMAL LOW (ref 12.0–15.0)
Hemoglobin: 11.9 g/dL — ABNORMAL LOW (ref 12.0–15.0)
Hemoglobin: 11.9 g/dL — ABNORMAL LOW (ref 12.0–15.0)
Immature Granulocytes: 0 %
Immature Granulocytes: 0 %
Immature Granulocytes: 0 %
Lymphocytes Relative: 14 %
Lymphocytes Relative: 14 %
Lymphocytes Relative: 14 %
Lymphs Abs: 0.8 10*3/uL (ref 0.7–4.0)
Lymphs Abs: 0.9 10*3/uL (ref 0.7–4.0)
Lymphs Abs: 1 10*3/uL (ref 0.7–4.0)
MCH: 31.4 pg (ref 26.0–34.0)
MCH: 31.9 pg (ref 26.0–34.0)
MCH: 32.1 pg (ref 26.0–34.0)
MCHC: 32.2 g/dL (ref 30.0–36.0)
MCHC: 32.3 g/dL (ref 30.0–36.0)
MCHC: 32.1 g/dL (ref 30.0–36.0)
MCV: 97.6 fL (ref 80.0–100.0)
MCV: 98.6 fL (ref 80.0–100.0)
MCV: 100 fL (ref 80.0–100.0)
Monocytes Absolute: 0.6 10*3/uL (ref 0.1–1.0)
Monocytes Absolute: 0.6 10*3/uL (ref 0.1–1.0)
Monocytes Absolute: 0.6 10*3/uL (ref 0.1–1.0)
Monocytes Relative: 10 %
Monocytes Relative: 9 %
Monocytes Relative: 8 %
Neutro Abs: 4.4 10*3/uL (ref 1.7–7.7)
Neutro Abs: 4.5 10*3/uL (ref 1.7–7.7)
Neutro Abs: 5.2 10*3/uL (ref 1.7–7.7)
Neutrophils Relative %: 72 %
Neutrophils Relative %: 74 %
Neutrophils Relative %: 75 %
Platelets: 265 10*3/uL (ref 150–400)
Platelets: 269 10*3/uL (ref 150–400)
Platelets: 274 10*3/uL (ref 150–400)
RBC: 3.7 MIL/uL — ABNORMAL LOW (ref 3.87–5.11)
RBC: 3.79 MIL/uL — ABNORMAL LOW (ref 3.87–5.11)
RBC: 3.71 MIL/uL — ABNORMAL LOW (ref 3.87–5.11)
RDW: 15.9 % — ABNORMAL HIGH (ref 11.5–15.5)
RDW: 15.9 % — ABNORMAL HIGH (ref 11.5–15.5)
RDW: 15.9 % — ABNORMAL HIGH (ref 11.5–15.5)
WBC: 6 10*3/uL (ref 4.0–10.5)
WBC: 6.2 10*3/uL (ref 4.0–10.5)
WBC: 7 10*3/uL (ref 4.0–10.5)
nRBC: 0 % (ref 0.0–0.2)
nRBC: 0 % (ref 0.0–0.2)
nRBC: 0 % (ref 0.0–0.2)

## 2021-07-22 LAB — RENAL FUNCTION PANEL
Albumin: 4 g/dL (ref 3.5–5.0)
Anion gap: 12 (ref 5–15)
BUN: 44 mg/dL — ABNORMAL HIGH (ref 6–20)
CO2: 27 mmol/L (ref 22–32)
Calcium: 9.6 mg/dL (ref 8.9–10.3)
Chloride: 97 mmol/L — ABNORMAL LOW (ref 98–111)
Creatinine, Ser: 2.76 mg/dL — ABNORMAL HIGH (ref 0.44–1.00)
GFR, Estimated: 21 mL/min — ABNORMAL LOW (ref >60)
Glucose, Bld: 92 mg/dL (ref 70–99)
Phosphorus: 4.1 mg/dL (ref 2.5–4.6)
Potassium: 4.6 mmol/L (ref 3.5–5.1)
Sodium: 136 mmol/L (ref 135–145)

## 2021-07-22 LAB — IRON AND TIBC
Iron: 117 ug/dL (ref 28–170)
Iron: 118 ug/dL (ref 28–170)
Saturation Ratios: 34 % — ABNORMAL HIGH (ref 10.4–31.8)
Saturation Ratios: 34 % — ABNORMAL HIGH (ref 10.4–31.8)
TIBC: 343 ug/dL (ref 250–450)
TIBC: 348 ug/dL (ref 250–450)
UIBC: 226 ug/dL
UIBC: 230 ug/dL

## 2021-07-22 LAB — BRAIN NATRIURETIC PEPTIDE: B Natriuretic Peptide: 510 pg/mL — ABNORMAL HIGH (ref 0.0–100.0)

## 2021-07-22 LAB — PROTEIN / CREATININE RATIO, URINE
Creatinine, Urine: 31.78 mg/dL
Protein Creatinine Ratio: UNDETERMINED mg/mg{Cre} (ref 0.00–0.15)
Total Protein, Urine: <6 mg/dL

## 2021-07-22 LAB — VITAMIN D 25 HYDROXY (VIT D DEFICIENCY, FRACTURES): Vit D, 25-Hydroxy: 14.41 ng/mL — ABNORMAL LOW (ref 30–100)

## 2021-07-22 LAB — RESP PANEL BY RT-PCR (FLU A&B, COVID) ARPGX2
Influenza A by PCR: NEGATIVE
Influenza B by PCR: NEGATIVE
SARS Coronavirus 2 by RT PCR: NEGATIVE

## 2021-07-22 LAB — FERRITIN: Ferritin: 198 ng/mL (ref 11–307)

## 2021-07-22 IMAGING — US US ABDOMEN COMPLETE
1 series · 13 of 25 positions shown · non-contrast
Comparison: [DATE] MRI

CLINICAL DATA: Right upper quadrant pain history of cirrhosis and
ascites

EXAM:
ABDOMEN ULTRASOUND COMPLETE

[Series 1: us abdomen complete · 13 of 111 slices shown]
[im 1/111]
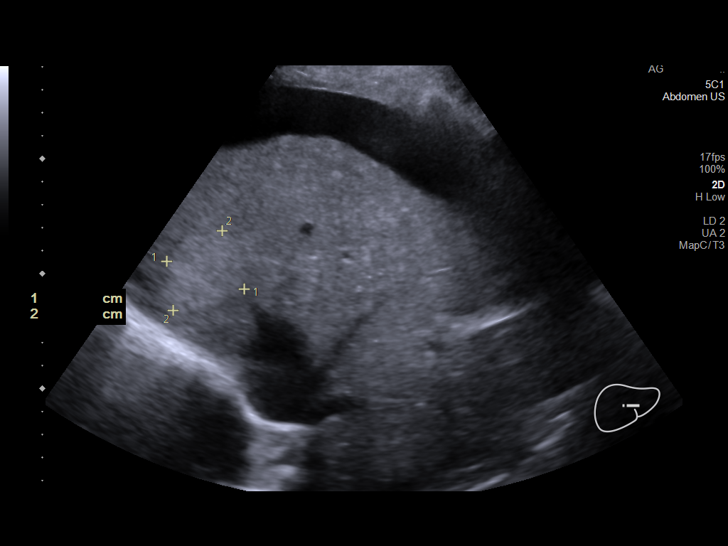
[im 10/111]
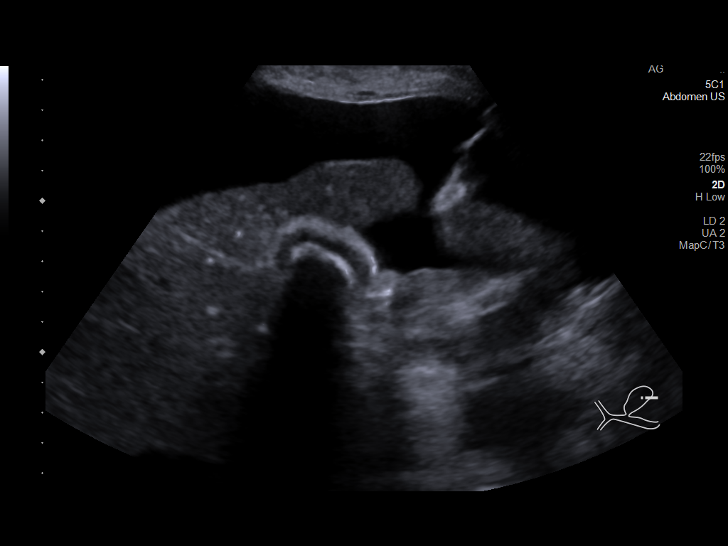
[im 19/111]
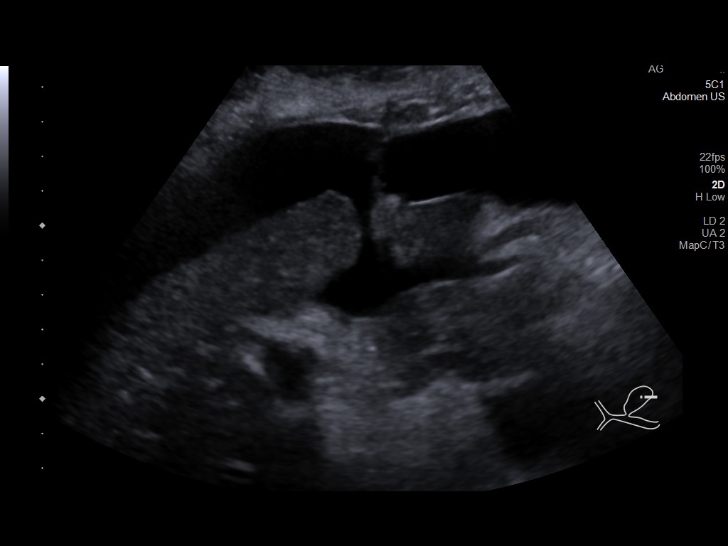
[im 28/111]
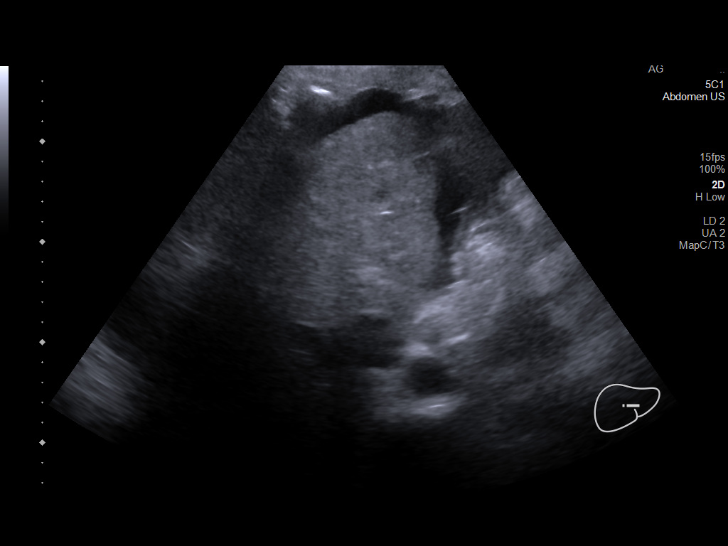
[im 37/111]
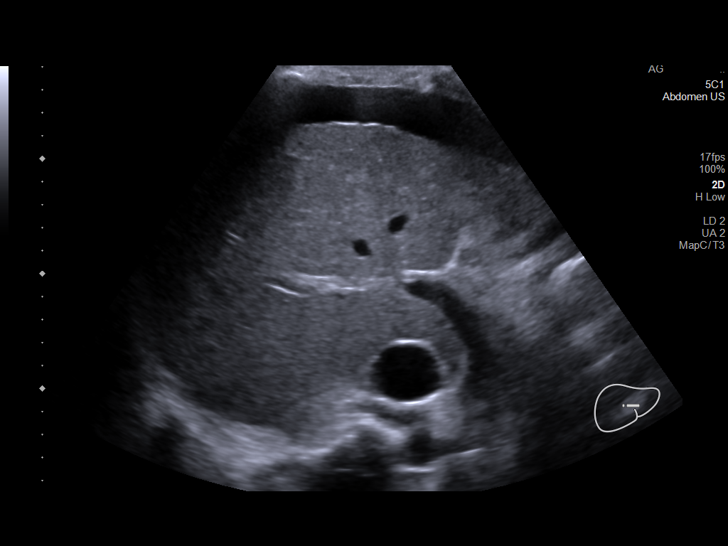
[im 46/111]
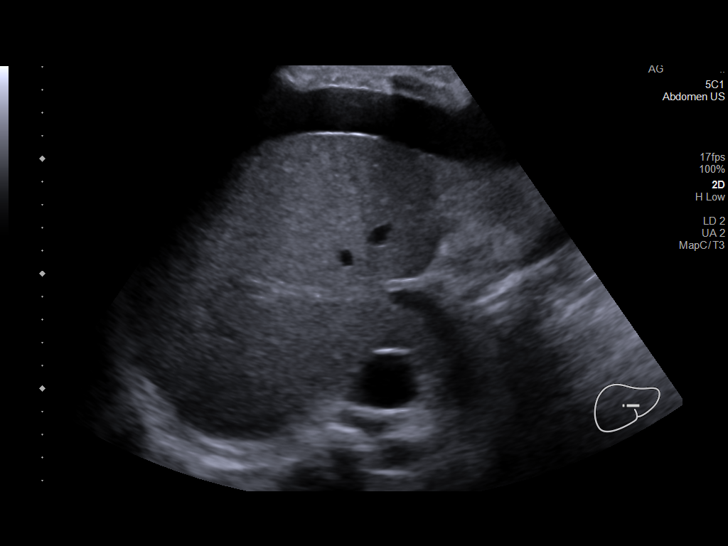
[im 56/111]
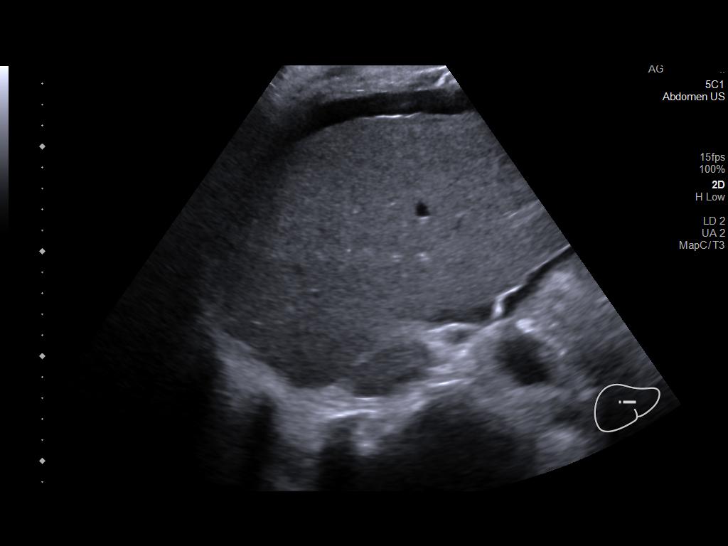
[im 65/111]
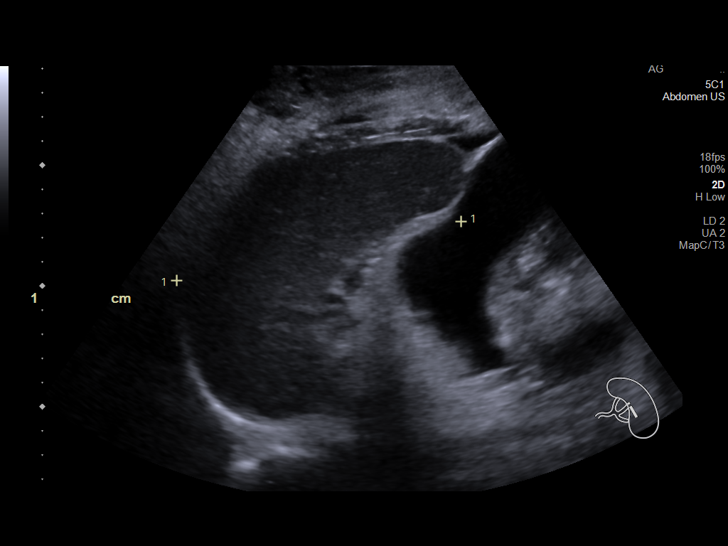
[im 74/111]
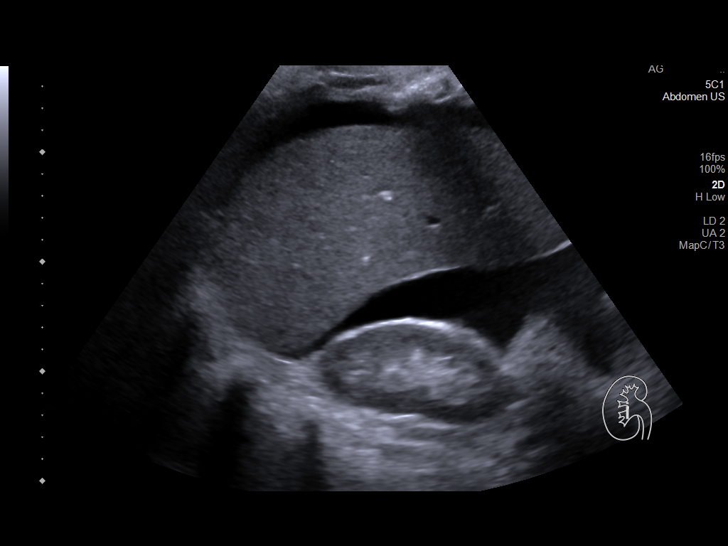
[im 83/111]
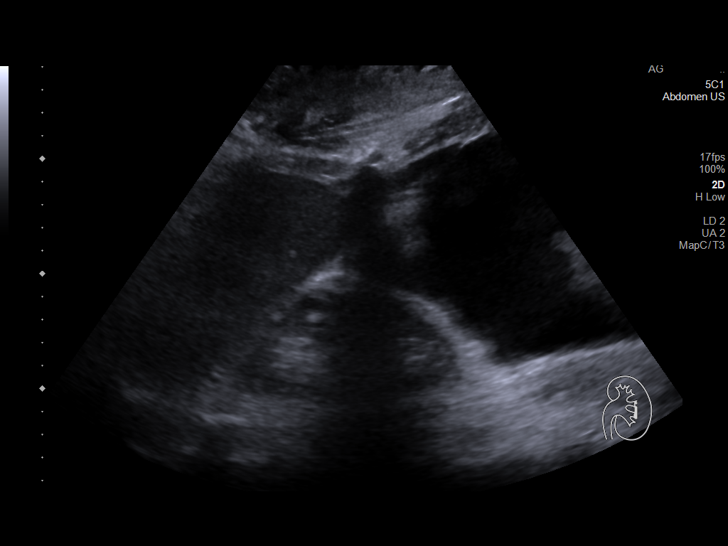
[im 92/111]
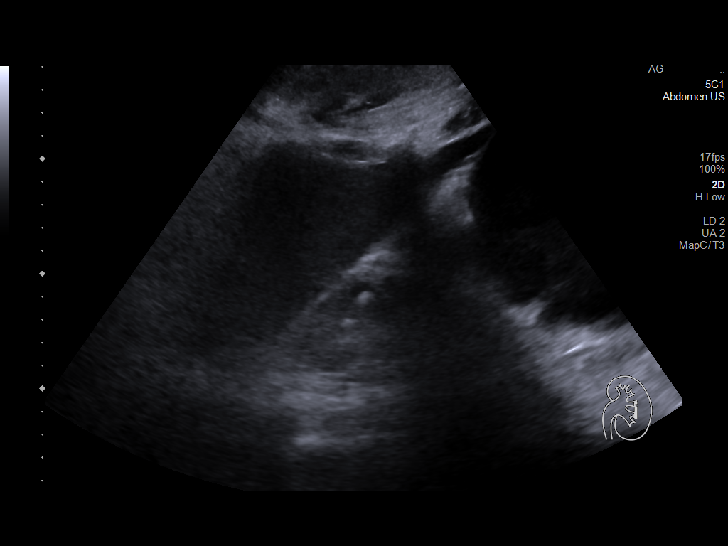
[im 101/111]
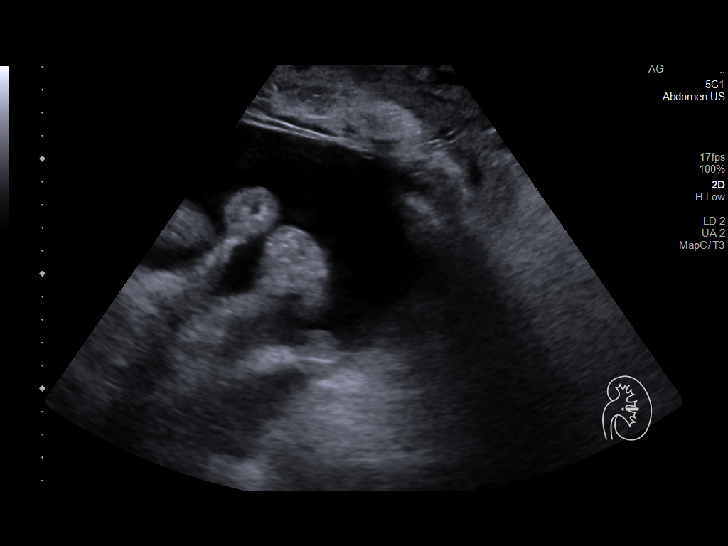
[im 111/111]
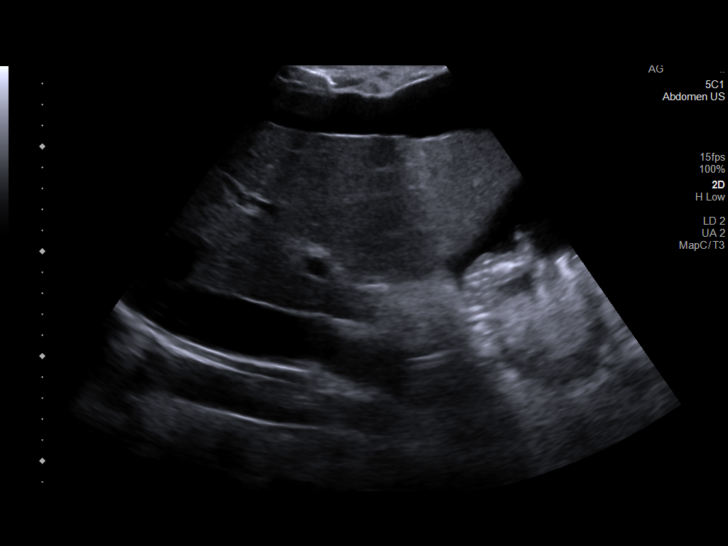

[13 of 25 positions shown; findings below may reference images not displayed]

FINDINGS: Gallbladder: Echogenic shadowing gallstone measures up to 3.3 cm by
ultrasound. Wall thickness measures 5.8 mm. No pericholecystic
fluid. Nonspecific Murphy's sign elicited over the gallbladder.

Common bile duct: Diameter: 4.7 mm

Liver: Increased echogenicity with surface nodularity compatible
with hepatic cirrhosis. Right hepatic dome solid hyperechoic lesion
measures 3.6 x 4.1 cm, previously demonstrated to be hemangioma by
MRI. Portal vein is patent on color Doppler imaging with normal
direction of blood flow towards the liver.

IVC: No abnormality visualized.

Pancreas: Unable to visualize because of body habitus

Spleen: Size and appearance within normal limits.

Right Kidney: Length: 8.9 cm. Echogenicity within normal limits. No
mass or hydronephrosis visualized.

Left Kidney: Length: 9.6 cm. Echogenicity within normal limits. No
mass or hydronephrosis. Possible small 7 mm shadowing calculus in
the midpole region.

Abdominal aorta: No aneurysm visualized.

Other findings: Moderate volume of abdominopelvic ascites.
IMPRESSION: Cholelithiasis with some degree of mild gallbladder wall thickening.
No pericholecystic fluid. Nonspecific sonographic Murphy's sign
elicited. Correlate clinically for cholecystitis.

Hepatic cirrhosis and moderate volume of abdominopelvic ascites.

4.1 cm hyperechoic right hepatic dome lesion previously demonstrated
be a hemangioma by MRI.

Query subcentimeter nonobstructing left nephrolithiasis.

## 2021-07-22 MED ORDER — FUROSEMIDE 10 MG/ML IJ SOLN
60.0000 mg | Freq: Once | INTRAMUSCULAR | Status: AC
  Administered 2021-07-22: 60 mg via INTRAVENOUS
  Filled 2021-07-22: qty 6

## 2021-07-22 MED ORDER — HYDROMORPHONE HCL 1 MG/ML IJ SOLN
1.0000 mg | Freq: Once | INTRAMUSCULAR | Status: AC
  Administered 2021-07-22: 1 mg via INTRAVENOUS
  Filled 2021-07-22: qty 1

## 2021-07-22 MED ORDER — OMEPRAZOLE 20 MG PO CPDR: 20.0000 mg | DELAYED_RELEASE_CAPSULE | Freq: Every day | ORAL | 3 refills | Status: DC

## 2021-07-22 NOTE — ED Triage Notes (Signed)
Pt sent to ED by gastro for significant anasarca with pitting edema. Pt with 8lb weight gain since last week. Pt states SOB got worse 2 weeks ago.

## 2021-07-22 NOTE — Patient Instructions (Addendum)
Please have ultrasound of your abdomen and blood work completed at [facility].  Continue omeprazole 20 mg daily 30 minutes before breakfast.  Follow a GERD diet:  Avoid fried, fatty, greasy, spicy, citrus foods. Avoid caffeine and carbonated beverages. Avoid chocolate. Try eating 4-6 small meals a day rather than 3 large meals. Do not eat within 3 hours of laying down. Prop head of bed up on wood or bricks to create a 6 inch incline.  Use Preparation H twice daily for 7-10 days at a time for rectal bleeding/hemorrhoids.  Let me know if this does not control your hemorrhoid symptoms well.  Continue to avoid straining.  Limit toilet time to 2-3 minutes.  Continue to avoid all NSAID products.  We will call you with your results and have further recommendations at that time.  Please call Bernerd Pho, PA-C as soon as you leave our office today regarding your weight gain and increased shortness of breath.  As we discussed, I think you will be best served in the hospital.   Aliene Altes, [facility]

## 2021-07-22 NOTE — ED Notes (Signed)
Pt ambulated to bathroom and back to room with steady gait, no respiratory compromise noted. O2 sats remain WNL on room air, breathing unlabored

## 2021-07-22 NOTE — ED Provider Notes (Signed)
[facility] EMERGENCY DEPARTMENT Provider Note   CSN: [id number] Arrival date & time: 07/22/21  1719     History Chief Complaint  Patient presents with   Shortness of Breath    Lauren Osborn is a 45 y.o. female with history of heart failure, obesity, liver disease, presented ED with shortness of breath.  Patient reports worsening dyspnea, anasarca, pitting edema, and abdominal distention and weight gain for the past 2 weeks.  She reports she has put on about 20 or 30 pounds.  She was told by her cardiologist and gastroenterologist that she needs to come to the ER for paracentesis and further management.  She is not on oxygen at home.  She reports she has chronic stabbing pains in her abdomen.  HPI     Past Medical History:  Diagnosis Date   Diastolic congestive heart failure (HCC)    Essential hypertension    Fibroids    GERD (gastroesophageal reflux disease)    Gout    Morbid obesity ([place])    Thyroid nodule     Patient Active Problem List   Diagnosis Date Noted   Right upper quadrant abdominal pain 07/22/2021   Rectal bleeding 07/22/2021   Shortness of breath 07/22/2021   Acute exacerbation of CHF (congestive heart failure) ([place]) 07/22/2021   CKD (chronic kidney disease), stage IV ([place]) 06/23/2021   Liver lesion 12/01/2020   Liver disease 12/01/2020   Elevated bilirubin 12/01/2020   OSA (obstructive sleep apnea) 11/16/2020   Acute on chronic heart failure with preserved ejection fraction (HFpEF) ([place]) 09/22/2020   Morbid obesity with BMI of 50.0-59.9, adult ([place]) 09/22/2020   Pulmonary edema 09/16/2020   Hypokalemia 09/16/2020   Prolonged QT interval 09/16/2020   Acute kidney injury ([place]) 09/16/2020   Elevated brain natriuretic peptide (BNP) level 09/16/2020   GERD (gastroesophageal reflux disease) 09/16/2020   Anasarca 09/16/2020   Super obese 09/16/2020   Normocytic anemia 04/23/2020   Family history of colon cancer 04/23/2020   Mass of upper outer quadrant  of right breast 11/04/2019   S/P partial thyroidectomy 10/21/2018   Visit for routine gyn exam 07/03/2018    Past Surgical History:  Procedure Laterality Date   BIOPSY  08/03/2020   Procedure: BIOPSY;  Surgeon: Eloise Harman, DO;  Location: [facility];  Service: Endoscopy;;  duodenal gastric   CESAREAN SECTION     COLONOSCOPY WITH PROPOFOL N/A 08/03/2020    Surgeon: Hurshel Keys K, DO; 5 mm tubular adenoma in the sigmoid colon, nonbleeding internal hemorrhoids.  Recommended repeat in 5 years.   ESOPHAGOGASTRODUODENOSCOPY (EGD) WITH PROPOFOL N/A 08/03/2020   Surgeon: Eloise Harman, DO; LA grade C esophagitis without bleeding, gastritis with erythema and shallow ulcerations in gastric antrum biopsied (negative for H. pylori), normal examined duodenum s/p biopsy (benign).   HYSTERECTOMY ABDOMINAL WITH SALPINGECTOMY  2008   POLYPECTOMY  08/03/2020   Procedure: POLYPECTOMY;  Surgeon: Eloise Harman, DO;  Location: [facility];  Service: Endoscopy;;  colon   THYROIDECTOMY Right 10/21/2018   Procedure: RIGHT HEMITHYROIDECTOMY;  Surgeon: Leta Baptist, MD;  Location: [place];  Service: ENT;  Laterality: Right;     OB History     Gravida  2   Para  1   Term  1   Preterm      AB  1   Living  1      SAB      IAB      Ectopic  Multiple      Live Births              Family History  Problem Relation Age of Onset   Breast cancer Mother    Other Paternal Grandfather        house fire   Other Maternal Grandmother        house fire   Congestive Heart Failure Maternal Grandfather    Colon cancer Father 74   Diverticulitis Brother    Diverticulitis Sister    Colon polyps Sister 20   Diabetes Daughter        borderline    Social History   Tobacco Use   Smoking status: Former    Packs/day: 0.50    Years: 20.00    Pack years: 10.00    Types: Cigarettes    Quit date: 2017    Years since quitting: 5.8   Smokeless tobacco: Never   Vaping Use   Vaping Use: Never used  Substance Use Topics   Alcohol use: Not Currently   Drug use: Never    Home Medications Prior to Admission medications   Medication Sig Start Date End Date Taking? Authorizing Provider  acetaminophen (TYLENOL) 500 MG tablet Take 1,000 mg by mouth every 6 (six) hours as needed for moderate pain or headache.   Yes [provider]  magnesium oxide (MAG-OX) 400 MG tablet TAKE 1 TABLET BY MOUTH ONCE DAILY. 01/17/21  Yes Satira Sark, MD  metolazone (ZAROXOLYN) 2.5 MG tablet Take 1 tablet (2.5 mg total) by mouth See admin instructions. 2.5 mg every M W F 06/28/21  Yes Barton Dubois, MD  omeprazole (PRILOSEC) 20 MG capsule Take 1 capsule (20 mg total) by mouth daily before breakfast. 07/22/21 01/18/22 Yes Jodi Mourning, Tivis Ringer, PA-C  potassium chloride SA (KLOR-CON) 20 MEQ tablet Take 3 tablets (60 mEq total) by mouth 3 (three) times daily. Take 2 additional Tablets on the Days That you take Metolazone 07/15/21  Yes Strader, [place], PA-C  spironolactone (ALDACTONE) 25 MG tablet Take 1 tablet (25 mg total) by mouth daily. 06/28/21  Yes Barton Dubois, MD  TART CHERRY PO Take by mouth daily.   Yes [provider]  torsemide (DEMADEX) 20 MG tablet Take 4 tablets in am and 3 tablets in the evening. 06/28/21  Yes Barton Dubois, MD    Allergies    Oxycodone-acetaminophen  Review of Systems   Review of Systems  Constitutional:  Negative for chills and fever.  HENT:  Negative for ear pain and sore throat.   Eyes:  Negative for pain and visual disturbance.  Respiratory:  Positive for shortness of breath. Negative for cough.   Cardiovascular:  Negative for chest pain and palpitations.  Gastrointestinal:  Positive for abdominal distention, abdominal pain and nausea. Negative for vomiting.  Genitourinary:  Negative for dysuria and hematuria.  Musculoskeletal:  Negative for arthralgias and back pain.  Skin:  Negative for color change and  rash.  Neurological:  Negative for syncope and headaches.  All other systems reviewed and are negative.  Physical Exam Updated Vital Signs BP 102/79   Pulse 80   Temp 98 F (36.7 C) (Oral)   Resp 15   Ht 5\' 3"  (1.6 m)   Wt (!) 143.3 kg   SpO2 94%   BMI 55.98 kg/m   Physical Exam Constitutional:      General: She is not in acute distress.    Appearance: She is obese.  HENT:  Head: Normocephalic and atraumatic.  Eyes:     Conjunctiva/sclera: Conjunctivae normal.     Pupils: Pupils are equal, round, and reactive to light.  Cardiovascular:     Rate and Rhythm: Normal rate and regular rhythm.  Pulmonary:     Effort: Pulmonary effort is normal. No respiratory distress.  Abdominal:     General: There is no distension.     Tenderness: There is no abdominal tenderness.     Comments: Ascites, no focal tenderness  Musculoskeletal:     Right lower leg: Edema present.     Left lower leg: Edema present.  Skin:    General: Skin is warm and dry.  Neurological:     General: No focal deficit present.     Mental Status: She is alert. Mental status is at baseline.  Psychiatric:        Mood and Affect: Mood normal.        Behavior: Behavior normal.    ED Results / Procedures / Treatments   Labs (all labs ordered are listed, but only abnormal results are displayed) Labs Reviewed  COMPREHENSIVE METABOLIC PANEL - Abnormal; Notable for the following components:      Result Value   BUN 43 (*)    Creatinine, Ser 2.76 (*)    Total Bilirubin 2.4 (*)    GFR, Estimated 21 (*)    All other components within normal limits  CBC WITH DIFFERENTIAL/PLATELET - Abnormal; Notable for the following components:   RBC 3.71 (*)    Hemoglobin 11.9 (*)    RDW 15.9 (*)    All other components within normal limits  BRAIN NATRIURETIC PEPTIDE - Abnormal; Notable for the following components:   B Natriuretic Peptide 510.0 (*)    All other components within normal limits  RESP PANEL BY RT-PCR (FLU  A&B, COVID) ARPGX2  LIPASE, BLOOD  PROTIME-INR  URINALYSIS, ROUTINE W REFLEX MICROSCOPIC    EKG None  Radiology US Abdomen Complete  Result Date: 07/22/2021 CLINICAL DATA:  Right upper quadrant pain history of cirrhosis and ascites EXAM: ABDOMEN ULTRASOUND COMPLETE COMPARISON:  12/17/2020 MRI FINDINGS: Gallbladder: Echogenic shadowing gallstone measures up to 3.3 cm by ultrasound. Wall thickness measures 5.8 mm. No pericholecystic fluid. Nonspecific Murphy's sign elicited over the gallbladder. Common bile duct: Diameter: 4.7 mm Liver: Increased echogenicity with surface nodularity compatible with hepatic cirrhosis. Right hepatic dome solid hyperechoic lesion measures 3.6 x 4.1 cm, previously demonstrated to be hemangioma by MRI. Portal vein is patent on color Doppler imaging with normal direction of blood flow towards the liver. IVC: No abnormality visualized. Pancreas: Unable to visualize because of body habitus Spleen: Size and appearance within normal limits. Right Kidney: Length: 8.9 cm. Echogenicity within normal limits. No mass or hydronephrosis visualized. Left Kidney: Length: 9.6 cm. Echogenicity within normal limits. No mass or hydronephrosis. Possible small 7 mm shadowing calculus in the midpole region. Abdominal aorta: No aneurysm visualized. Other findings: Moderate volume of abdominopelvic ascites. IMPRESSION: Cholelithiasis with some degree of mild gallbladder wall thickening. No pericholecystic fluid. Nonspecific sonographic Murphy's sign elicited. Correlate clinically for cholecystitis. Hepatic cirrhosis and moderate volume of abdominopelvic ascites. 4.1 cm hyperechoic right hepatic dome lesion previously demonstrated be a hemangioma by MRI. Query subcentimeter nonobstructing left nephrolithiasis. Electronically Signed   By: Jerilynn Mages.  Shick M.D.   On: 07/22/2021 11:44   DG Chest Portable 1 View  Result Date: 07/22/2021 CLINICAL DATA:  Dyspnea EXAM: PORTABLE CHEST 1 VIEW COMPARISON:   06/23/2021 FINDINGS: Lungs volumes are small, but  are symmetric and are clear. No pneumothorax or pleural effusion. Cardiac size within normal limits. Vascular crowding at the hila secondary to poor pulmonary insufflation. Osseous structures are age-appropriate. No acute bone abnormality. IMPRESSION: No active disease. Electronically Signed   By: Fidela Salisbury M.D.   On: 07/22/2021 22:01    Procedures Procedures   Medications Ordered in ED Medications  HYDROmorphone (DILAUDID) injection 1 mg (1 mg Intravenous Given 07/22/21 2155)  furosemide (LASIX) injection 60 mg (60 mg Intravenous Given 07/22/21 2300)    ED Course  I have reviewed the triage vital signs and the nursing notes.  Pertinent labs & imaging results that were available during my care of the patient were reviewed by me and considered in my medical decision making (see chart for details).  Patient is here with anasarca, ascites, tachypnea and reporting orthopnea.  I reviewed her prior medical records including her recent hospitalizations for similar presentations.  Unfortunately does appear that she is clinically worsening despite aggressive diuretic regimen at home, which she reports compliance with.  Although she is not hypoxic, she is tachypneic.  She clearly has ascites in the abdomen.  I would anticipate admission for diuresis and therapeutic paracentesis if possible.  I personally reviewed interpret her lab work which shows no significant changes from her prior testing, with creatinine near his baseline levels, hemoglobin at baseline.  No fever or leukocytosis.  I doubt SBP.  Her BNP is mildly elevated over the past time at 500.  I have ordered pain medication as well as IV Lasix for diuresis here.  Chest x-ray showing cardiomegaly without evidence of pneumonia.  Plan for admission.  Clinical Course as of 07/23/21 0002  Fri Jul 22, 2021  2242 Admitted to hospitalist [MT]    Clinical Course User Index [MT] Beautiful Pensyl, Carola Rhine,  MD   Final Clinical Impression(s) / ED Diagnoses Final diagnoses:  Anasarca  Other ascites  Dyspnea, unspecified type    Rx / DC Orders ED Discharge Orders     None        Wyvonnia Dusky, MD 07/23/21 0002

## 2021-07-22 NOTE — H&P (Addendum)
History and Physical  Lauren Osborn IFO:[id number] DOB: 06-20-1976 DOA: 07/22/2021  Referring physician: Wyvonnia Dusky, MD PCP: [name], MD  Patient coming from: Home  Chief Complaint: Shortness of breath  HPI: Lauren Osborn is a 45 y.o. female with medical history significant for hypertension, CHF, OSA on CPAP, gout and obesity who presents to the emergency department due to worsening shortness of breath, increased leg swelling, abdominal distention and weight gain that has been ongoing for about 2 weeks.  Patient complained of 20 to 30 pound weight gain despite being compliant with home meds (torsemide 80 mg a.m/60 mg p.m and metolazone 2.5 mg 3 times weekly and spironolactone).   Patient also complained of 1 week of right upper quadrant abdominal pain which was described as sharp and worsens with eating. She followed up with her cardiologist on 11/10 and there was consideration for referring patient to advanced heart failure for any possible additional recommendation. She followed up with her gastroenterologist today (11/18) who recommended for her to go to the ED for further evaluation and management.  ED Course:  In the emergency department, BP was soft at 91/62, but other vital signs were within normal range.  Work-up in the ED showed normocytic anemia, BUN to creatinine 43/2.76, BNP 510, vitamin D14.41.  Urinalysis was negative, lipase 46.  Influenza A, B, SARS coronavirus 2 was negative. Chest x-ray showed no active disease Complete abdominal ultrasound showed cholelithiasis with some degree of mild gallbladder wall thickening.  Hepatic cirrhosis and moderate volume of abdominal pelvic ascites. 4.1 cm hyperechoic right hepatic dome lesion previously demonstrated to be a hemangioma by MRI. IV Lasix 60 mg and IV Dilaudid were given.  Cardiology at [facility] was consulted and was aware that patient will be admitted to [facility] since no cardiology at [facility] on weekends.  Review of  Systems: Constitutional: Negative for chills and fever.  HENT: Negative for ear pain and sore throat.   Eyes: Negative for pain and visual disturbance.  Respiratory: Negative for cough, chest tightness and shortness of breath.   Cardiovascular: Negative for chest pain and palpitations.  Gastrointestinal: Positive for abdominal distention.  Negative for vomiting.  Endocrine: Negative for polyphagia and polyuria.  Genitourinary: Negative for decreased urine volume, dysuria, enuresis Musculoskeletal: Positive for leg swelling.  Negative for arthralgias and back pain.  Skin: Negative for color change and rash.  Allergic/Immunologic: Negative for immunocompromised state.  Neurological: Negative for tremors, syncope, speech difficulty, weakness, light-headedness and headaches.  Hematological: Does not bruise/bleed easily.  All other systems reviewed and are negative   Past Medical History:  Diagnosis Date   Diastolic congestive heart failure (HCC)    Essential hypertension    Fibroids    GERD (gastroesophageal reflux disease)    Gout    Morbid obesity ([place])    Thyroid nodule    Past Surgical History:  Procedure Laterality Date   BIOPSY  08/03/2020   Procedure: BIOPSY;  Surgeon: Eloise Harman, DO;  Location: [facility];  Service: Endoscopy;;  duodenal gastric   CESAREAN SECTION     COLONOSCOPY WITH PROPOFOL N/A 08/03/2020    Surgeon: Hurshel Keys K, DO; 5 mm tubular adenoma in the sigmoid colon, nonbleeding internal hemorrhoids.  Recommended repeat in 5 years.   ESOPHAGOGASTRODUODENOSCOPY (EGD) WITH PROPOFOL N/A 08/03/2020   Surgeon: Eloise Harman, DO; LA grade C esophagitis without bleeding, gastritis with erythema and shallow ulcerations in gastric antrum biopsied (negative for H. pylori), normal examined duodenum s/p biopsy (  benign).   HYSTERECTOMY ABDOMINAL WITH SALPINGECTOMY  2008   POLYPECTOMY  08/03/2020   Procedure: POLYPECTOMY;  Surgeon: Eloise Harman, DO;   Location: [facility];  Service: Endoscopy;;  colon   THYROIDECTOMY Right 10/21/2018   Procedure: RIGHT HEMITHYROIDECTOMY;  Surgeon: Leta Baptist, MD;  Location: [place];  Service: ENT;  Laterality: Right;    Social History:  reports that she quit smoking about 5 years ago. Her smoking use included cigarettes. She has a 10.00 pack-year smoking history. She has never used smokeless tobacco. She reports that she does not currently use alcohol. She reports that she does not use drugs.   Allergies  Allergen Reactions   Oxycodone-Acetaminophen Hives and Other (See Comments)    Family History  Problem Relation Age of Onset   Breast cancer Mother    Other Paternal Grandfather        house fire   Other Maternal Grandmother        house fire   Congestive Heart Failure Maternal Grandfather    Colon cancer Father 16   Diverticulitis Brother    Diverticulitis Sister    Colon polyps Sister 58   Diabetes Daughter        borderline     Prior to Admission medications   Medication Sig Start Date End Date Taking? Authorizing Provider  acetaminophen (TYLENOL) 500 MG tablet Take 1,000 mg by mouth every 6 (six) hours as needed for moderate pain or headache.   Yes [provider]  magnesium oxide (MAG-OX) 400 MG tablet TAKE 1 TABLET BY MOUTH ONCE DAILY. 01/17/21  Yes Satira Sark, MD  metolazone (ZAROXOLYN) 2.5 MG tablet Take 1 tablet (2.5 mg total) by mouth See admin instructions. 2.5 mg every M W F 06/28/21  Yes Barton Dubois, MD  omeprazole (PRILOSEC) 20 MG capsule Take 1 capsule (20 mg total) by mouth daily before breakfast. 07/22/21 01/18/22 Yes Jodi Mourning, Tivis Ringer, PA-C  potassium chloride SA (KLOR-CON) 20 MEQ tablet Take 3 tablets (60 mEq total) by mouth 3 (three) times daily. Take 2 additional Tablets on the Days That you take Metolazone 07/15/21  Yes Strader, [place], PA-C  spironolactone (ALDACTONE) 25 MG tablet Take 1 tablet (25 mg total) by mouth daily.  06/28/21  Yes Barton Dubois, MD  TART CHERRY PO Take by mouth daily.   Yes [provider]  torsemide (DEMADEX) 20 MG tablet Take 4 tablets in am and 3 tablets in the evening. 06/28/21  Yes Barton Dubois, MD    Physical Exam: BP 118/80   Pulse 76   Temp 98 F (36.7 C) (Oral)   Resp 14   Ht 5\' 3"  (1.6 m)   Wt (!) 143.3 kg   SpO2 96%   BMI 55.98 kg/m   General: 45 y.o. year-old pleasant obese female well developed well nourished in no acute distress.  Alert and oriented x3. HEENT: NCAT, EOMI Neck: Supple, trachea medial Cardiovascular: Regular rate and rhythm with no rubs or gallops.  No thyromegaly or JVD noted.  No lower extremity edema. 2/4 pulses in all 4 extremities. Respiratory: Clear to auscultation with no wheezes or rales. Good inspiratory effort. Abdomen: Soft, mild tenderness to palpation of RUQ.  Positive abdominal distention. Normal bowel sounds x4 quadrants. Muskuloskeletal: +3 pitting edema in lower extremities (LLE  >RLE).  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  No ulcerative lesions noted or rashes Psychiatry:  Mood  is appropriate for condition and setting          Labs on Admission:  Basic Metabolic Panel: Recent Labs  Lab 07/22/21 1144 07/22/21 1148 07/22/21 1153 07/22/21 1755  NA 137 136 135 135  K 4.7 4.6 4.6 4.9  CL 98 97* 96* 98  CO2 27 27 26 25   GLUCOSE 93 92 91 85  BUN 44* 44* 44* 43*  CREATININE 2.75* 2.76* 2.76* 2.76*  CALCIUM 9.7 9.6 9.5 9.3  PHOS  --  4.1  --   --    Liver Function Tests: Recent Labs  Lab 07/22/21 1144 07/22/21 1148 07/22/21 1153 07/22/21 1755  AST 38  --  38 40  ALT 18  --  18 18  ALKPHOS 84  --  89 88  BILITOT 2.7*  --  2.7* 2.4*  PROT 7.8  --  7.9 7.7  ALBUMIN 4.1 4.0 4.1 4.0   Recent Labs  Lab 07/22/21 1144 07/22/21 1755  LIPASE 41 46   No results for input(s): AMMONIA in the last 168 hours. CBC: Recent Labs  Lab  07/22/21 1144 07/22/21 1153 07/22/21 1755  WBC 6.0 6.2 7.0  NEUTROABS 4.4 4.5 5.2  HGB 11.9* 11.8* 11.9*  HCT 37.0 36.5 37.1  MCV 97.6 98.6 100.0  PLT 265 269 274   Cardiac Enzymes: No results for input(s): CKTOTAL, CKMB, CKMBINDEX, TROPONINI in the last 168 hours.  BNP (last 3 results) Recent Labs    03/04/21 1222 06/23/21 1807 07/22/21 1755  BNP 543.0* 357.0* 510.0*    ProBNP (last 3 results) No results for input(s): PROBNP in the last 8760 hours.  CBG: No results for input(s): GLUCAP in the last 168 hours.  Radiological Exams on Admission: US Abdomen Complete  Result Date: 07/22/2021 CLINICAL DATA:  Right upper quadrant pain history of cirrhosis and ascites EXAM: ABDOMEN ULTRASOUND COMPLETE COMPARISON:  12/17/2020 MRI FINDINGS: Gallbladder: Echogenic shadowing gallstone measures up to 3.3 cm by ultrasound. Wall thickness measures 5.8 mm. No pericholecystic fluid. Nonspecific Murphy's sign elicited over the gallbladder. Common bile duct: Diameter: 4.7 mm Liver: Increased echogenicity with surface nodularity compatible with hepatic cirrhosis. Right hepatic dome solid hyperechoic lesion measures 3.6 x 4.1 cm, previously demonstrated to be hemangioma by MRI. Portal vein is patent on color Doppler imaging with normal direction of blood flow towards the liver. IVC: No abnormality visualized. Pancreas: Unable to visualize because of body habitus Spleen: Size and appearance within normal limits. Right Kidney: Length: 8.9 cm. Echogenicity within normal limits. No mass or hydronephrosis visualized. Left Kidney: Length: 9.6 cm. Echogenicity within normal limits. No mass or hydronephrosis. Possible small 7 mm shadowing calculus in the midpole region. Abdominal aorta: No aneurysm visualized. Other findings: Moderate volume of abdominopelvic ascites. IMPRESSION: Cholelithiasis with some degree of mild gallbladder wall thickening. No pericholecystic fluid. Nonspecific sonographic Murphy's sign  elicited. Correlate clinically for cholecystitis. Hepatic cirrhosis and moderate volume of abdominopelvic ascites. 4.1 cm hyperechoic right hepatic dome lesion previously demonstrated be a hemangioma by MRI. Query subcentimeter nonobstructing left nephrolithiasis. Electronically Signed   By: Jerilynn Mages.  Shick M.D.   On: 07/22/2021 11:44   DG Chest Portable 1 View  Result Date: 07/22/2021 CLINICAL DATA:  Dyspnea EXAM: PORTABLE CHEST 1 VIEW COMPARISON:  06/23/2021 FINDINGS: Lungs volumes are small, but are symmetric and are clear. No pneumothorax or pleural effusion. Cardiac size within normal limits. Vascular crowding at the hila secondary to poor pulmonary insufflation. Osseous structures are age-appropriate. No acute bone abnormality. IMPRESSION: No active disease.  Electronically Signed   By: Fidela Salisbury M.D.   On: 07/22/2021 22:01    EKG: I independently viewed the EKG done and my findings are as followed: EKG was not done in the ED  Assessment/Plan Present on Admission:  Anasarca  CKD (chronic kidney disease), stage IV (HCC)  Elevated brain natriuretic peptide (BNP) level  Acute on chronic heart failure with preserved ejection fraction (HFpEF) (HCC)  GERD (gastroesophageal reflux disease)  Normocytic anemia  Active Problems:   Normocytic anemia   Elevated brain natriuretic peptide (BNP) level   GERD (gastroesophageal reflux disease)   Anasarca   Acute on chronic heart failure with preserved ejection fraction (HFpEF) ([place])   Morbid obesity with BMI of 50.0-59.9, adult (HCC)   CKD (chronic kidney disease), stage IV (HCC)   Abdominal pain   Vitamin D deficiency   Hepatic cirrhosis (HCC)   Hepatic hemangioma  Acute on chronic heart failure with HFpEF Elevated BNP-510 Patient presents with increased weight gain despite compliance with multiple diuretics Ultrasound showed moderate volume of abdominal pelvic ascites; patient has anasarca Continue total input/output, daily weights and fluid  restriction Continue IV Lasix 40 milligram twice daily (as permitted by BP) Continue Cardiac diet  Echocardiogram done on 7/22 (below).  This will be repeated in the morning   Right upper quadrant abdominal pain possibly secondary to cholelithiasis Abdominal ultrasound confirmed cholelithiasis Continue Dilaudid 0.5 mg mg every 4 hours as needed  Hepatic cirrhosis Abdominal ultrasound showed hepatic cirrhosis and moderate volume of abdominal pelvic ascites Patient [facility] with [facility] Liver enzymes were normal Hepatitis panel will be checked Consider gastroenterology consult  Hepatic hemangioma Abdominal ultrasound showed 4.1 cm hyperechoic right hepatic dome lesion previously demonstrated be a hemangioma by MRI. Continue to monitor.  LLE > RLE swelling Electrolytes ultrasound will be done in the morning to rule out DVT  Essential hypertension BP meds will be held at this time due to soft BP  Chronic kidney disease stage IV Stable. Renally adjust medications, avoid nephrotoxic agents/dehydration/hypotension  GERD Continue Protonix  Normocytic anemia Stable, hemoglobin within baseline range  Vitamin D deficiency Continue vitamin D  Obesity class III (BMI 55.98 kg/m) Patient was counseled on diet and lifestyle modification   Echocardiogram: 03/2021 IMPRESSIONS     1. Left ventricular ejection fraction, by estimation, is 60 to 65%. The  left ventricle has normal function. The left ventricle has no regional  wall motion abnormalities. Left ventricular diastolic parameters were  normal. There is the interventricular  septum is flattened in diastole ('D' shaped left ventricle), consistent  with right ventricular volume overload.   2. Right ventricular systolic function is normal. The right ventricular  size is mildly enlarged. There is normal pulmonary artery systolic  pressure. The estimated right ventricular systolic pressure is 01.0 mmHg.   3.  Left atrial size was upper normal.   4. The mitral valve is grossly normal. Trivial mitral valve  regurgitation.   5. The aortic valve is tricuspid. Aortic valve regurgitation is not  visualized.   6. The inferior vena cava is normal in size with <50% respiratory  variability, suggesting right atrial pressure of 8 mmHg.    DVT prophylaxis: Lovenox  Code Status: Full code  Family Communication: None at bedside  Disposition Plan:  Patient is from:                        home Anticipated DC to:  SNF or family members home Anticipated DC date:               2-3 days Anticipated DC barriers:          Patient requires inpatient management due to acute on chronic diastolic CHF pending cardiology consult  Consults called: Cardiology  Admission status: Inpatient    Bernadette Hoit MD [facility]  07/23/2021, 3:04 AM

## 2021-07-23 ENCOUNTER — Encounter (HOSPITAL_COMMUNITY): Payer: Self-pay | Admitting: Internal Medicine

## 2021-07-23 ENCOUNTER — Inpatient Hospital Stay (HOSPITAL_COMMUNITY): Payer: Managed Care, Other (non HMO)

## 2021-07-23 DIAGNOSIS — E875 Hyperkalemia: Secondary | ICD-10-CM | POA: Diagnosis not present

## 2021-07-23 DIAGNOSIS — K2100 Gastro-esophageal reflux disease with esophagitis, without bleeding: Secondary | ICD-10-CM | POA: Diagnosis not present

## 2021-07-23 DIAGNOSIS — D1803 Hemangioma of intra-abdominal structures: Secondary | ICD-10-CM

## 2021-07-23 DIAGNOSIS — R188 Other ascites: Secondary | ICD-10-CM

## 2021-07-23 DIAGNOSIS — Z6841 Body Mass Index (BMI) 40.0 and over, adult: Secondary | ICD-10-CM | POA: Diagnosis not present

## 2021-07-23 DIAGNOSIS — Z20822 Contact with and (suspected) exposure to covid-19: Secondary | ICD-10-CM | POA: Diagnosis not present

## 2021-07-23 DIAGNOSIS — N186 End stage renal disease: Secondary | ICD-10-CM | POA: Diagnosis not present

## 2021-07-23 DIAGNOSIS — K7460 Unspecified cirrhosis of liver: Secondary | ICD-10-CM | POA: Diagnosis not present

## 2021-07-23 DIAGNOSIS — D631 Anemia in chronic kidney disease: Secondary | ICD-10-CM | POA: Diagnosis not present

## 2021-07-23 DIAGNOSIS — E871 Hypo-osmolality and hyponatremia: Secondary | ICD-10-CM | POA: Diagnosis not present

## 2021-07-23 DIAGNOSIS — R34 Anuria and oliguria: Secondary | ICD-10-CM | POA: Diagnosis not present

## 2021-07-23 DIAGNOSIS — R601 Generalized edema: Secondary | ICD-10-CM | POA: Diagnosis not present

## 2021-07-23 DIAGNOSIS — I953 Hypotension of hemodialysis: Secondary | ICD-10-CM | POA: Diagnosis not present

## 2021-07-23 DIAGNOSIS — E559 Vitamin D deficiency, unspecified: Secondary | ICD-10-CM

## 2021-07-23 DIAGNOSIS — E876 Hypokalemia: Secondary | ICD-10-CM | POA: Diagnosis not present

## 2021-07-23 DIAGNOSIS — I5082 Biventricular heart failure: Secondary | ICD-10-CM | POA: Diagnosis not present

## 2021-07-23 DIAGNOSIS — I2729 Other secondary pulmonary hypertension: Secondary | ICD-10-CM | POA: Diagnosis not present

## 2021-07-23 DIAGNOSIS — G4733 Obstructive sleep apnea (adult) (pediatric): Secondary | ICD-10-CM | POA: Diagnosis not present

## 2021-07-23 DIAGNOSIS — I5033 Acute on chronic diastolic (congestive) heart failure: Secondary | ICD-10-CM

## 2021-07-23 DIAGNOSIS — N184 Chronic kidney disease, stage 4 (severe): Secondary | ICD-10-CM

## 2021-07-23 DIAGNOSIS — N179 Acute kidney failure, unspecified: Secondary | ICD-10-CM | POA: Diagnosis not present

## 2021-07-23 DIAGNOSIS — Z992 Dependence on renal dialysis: Secondary | ICD-10-CM | POA: Diagnosis not present

## 2021-07-23 DIAGNOSIS — R0600 Dyspnea, unspecified: Secondary | ICD-10-CM | POA: Diagnosis not present

## 2021-07-23 DIAGNOSIS — I5031 Acute diastolic (congestive) heart failure: Secondary | ICD-10-CM

## 2021-07-23 DIAGNOSIS — R109 Unspecified abdominal pain: Secondary | ICD-10-CM

## 2021-07-23 DIAGNOSIS — E890 Postprocedural hypothyroidism: Secondary | ICD-10-CM | POA: Diagnosis not present

## 2021-07-23 DIAGNOSIS — I132 Hypertensive heart and chronic kidney disease with heart failure and with stage 5 chronic kidney disease, or end stage renal disease: Secondary | ICD-10-CM | POA: Diagnosis not present

## 2021-07-23 DIAGNOSIS — K760 Fatty (change of) liver, not elsewhere classified: Secondary | ICD-10-CM | POA: Diagnosis not present

## 2021-07-23 DIAGNOSIS — I5043 Acute on chronic combined systolic (congestive) and diastolic (congestive) heart failure: Secondary | ICD-10-CM | POA: Diagnosis not present

## 2021-07-23 LAB — COMPREHENSIVE METABOLIC PANEL
ALT: 16 U/L (ref 0–44)
AST: 34 U/L (ref 15–41)
Albumin: 3.7 g/dL (ref 3.5–5.0)
Alkaline Phosphatase: 79 U/L (ref 38–126)
Anion gap: 11 (ref 5–15)
BUN: 46 mg/dL — ABNORMAL HIGH (ref 6–20)
CO2: 26 mmol/L (ref 22–32)
Calcium: 9.2 mg/dL (ref 8.9–10.3)
Chloride: 99 mmol/L (ref 98–111)
Creatinine, Ser: 2.84 mg/dL — ABNORMAL HIGH (ref 0.44–1.00)
GFR, Estimated: 20 mL/min — ABNORMAL LOW (ref >60)
Glucose, Bld: 93 mg/dL (ref 70–99)
Potassium: 5 mmol/L (ref 3.5–5.1)
Sodium: 136 mmol/L (ref 135–145)
Total Bilirubin: 2.5 mg/dL — ABNORMAL HIGH (ref 0.3–1.2)
Total Protein: 7.2 g/dL (ref 6.5–8.1)

## 2021-07-23 LAB — HEPATITIS PANEL, ACUTE
HCV Ab: NONREACTIVE
Hep A IgM: NONREACTIVE
Hep B C IgM: NONREACTIVE
Hepatitis B Surface Ag: NONREACTIVE

## 2021-07-23 LAB — URINALYSIS, ROUTINE W REFLEX MICROSCOPIC
Bilirubin Urine: NEGATIVE
Glucose, UA: NEGATIVE mg/dL
Hgb urine dipstick: NEGATIVE
Ketones, ur: NEGATIVE mg/dL
Leukocytes,Ua: NEGATIVE
Nitrite: NEGATIVE
Protein, ur: NEGATIVE mg/dL
Specific Gravity, Urine: 1.005 (ref 1.005–1.030)
pH: 5 (ref 5.0–8.0)

## 2021-07-23 LAB — ECHOCARDIOGRAM COMPLETE
Area-P 1/2: 3.3 cm2
Height: 63 in
S' Lateral: 2.8 cm
Weight: 5056 oz

## 2021-07-23 LAB — CBC
HCT: 35.1 % — ABNORMAL LOW (ref 36.0–46.0)
Hemoglobin: 11.2 g/dL — ABNORMAL LOW (ref 12.0–15.0)
MCH: 31.9 pg (ref 26.0–34.0)
MCHC: 31.9 g/dL (ref 30.0–36.0)
MCV: 100 fL (ref 80.0–100.0)
Platelets: 245 10*3/uL (ref 150–400)
RBC: 3.51 MIL/uL — ABNORMAL LOW (ref 3.87–5.11)
RDW: 15.9 % — ABNORMAL HIGH (ref 11.5–15.5)
WBC: 5.8 10*3/uL (ref 4.0–10.5)
nRBC: 0 % (ref 0.0–0.2)

## 2021-07-23 LAB — T4, FREE: Free T4: 1.42 ng/dL — ABNORMAL HIGH (ref 0.61–1.12)

## 2021-07-23 LAB — TSH: TSH: 5.189 u[IU]/mL — ABNORMAL HIGH (ref 0.350–4.500)

## 2021-07-23 LAB — PARATHYROID HORMONE, INTACT (NO CA): PTH: 61 pg/mL (ref 15–65)

## 2021-07-23 LAB — PHOSPHORUS: Phosphorus: 4.4 mg/dL (ref 2.5–4.6)

## 2021-07-23 LAB — MAGNESIUM: Magnesium: 2 mg/dL (ref 1.7–2.4)

## 2021-07-23 IMAGING — US US EXTREM LOW VENOUS*L*
1 series · 13 of 24 positions shown · non-contrast
Comparison: None.

CLINICAL DATA: Left leg swelling

EXAM:
LEFT LOWER EXTREMITY VENOUS DOPPLER ULTRASOUND
TECHNIQUE: Gray-scale sonography with compression, as well as color and duplex
ultrasound, were performed to evaluate the deep venous system(s)
from the level of the common femoral vein through the popliteal and
proximal calf veins.

[Series 1: us venous img lower uni left (dvt) · portal-venous · 13 of 51 slices shown]
[im 1/51]
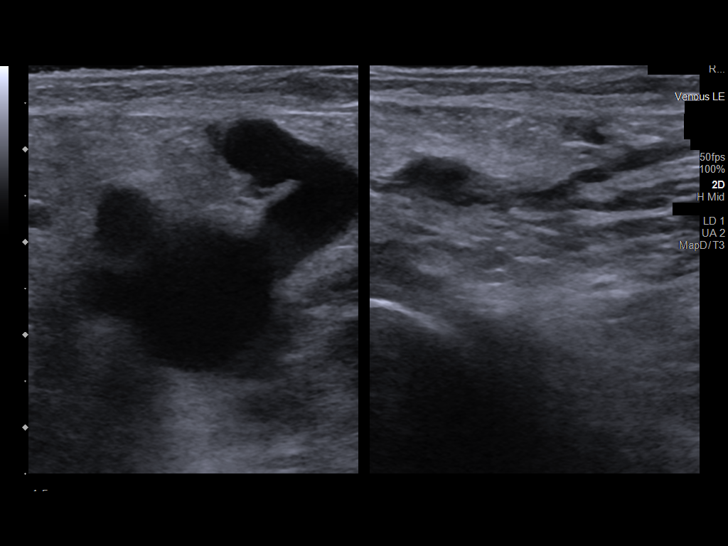
[im 5/51]
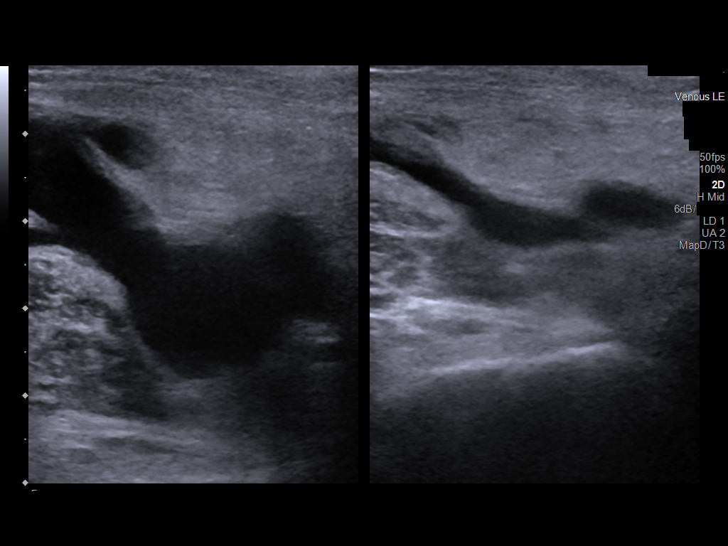
[im 9/51]
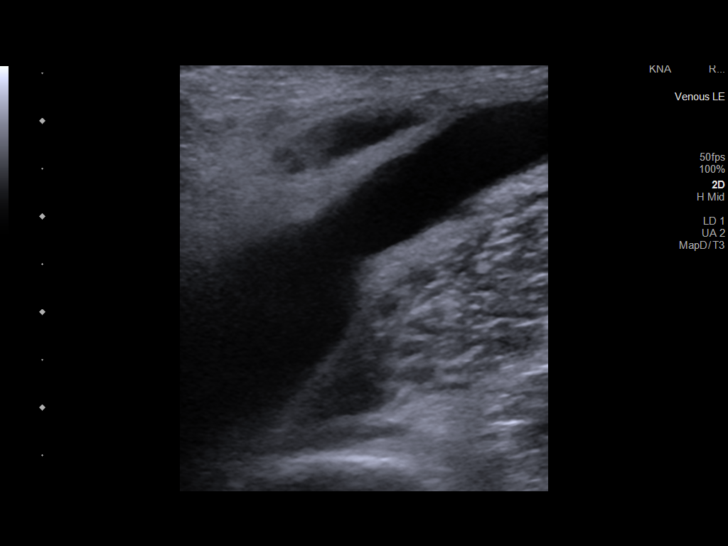
[im 14/51]
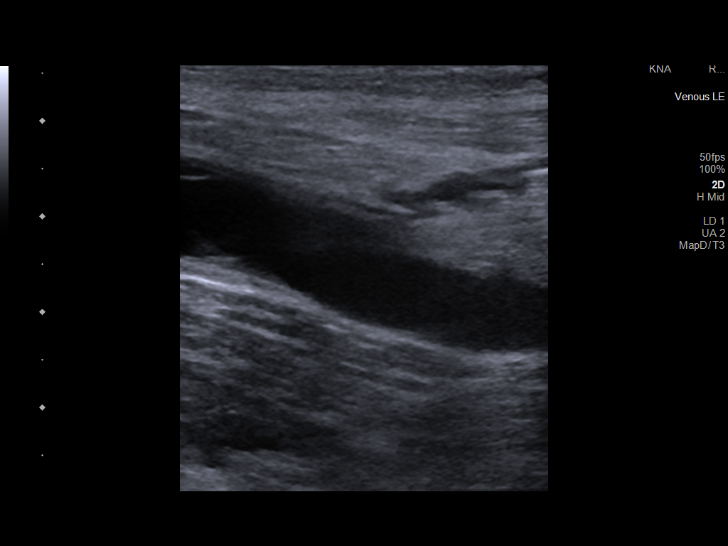
[im 18/51]
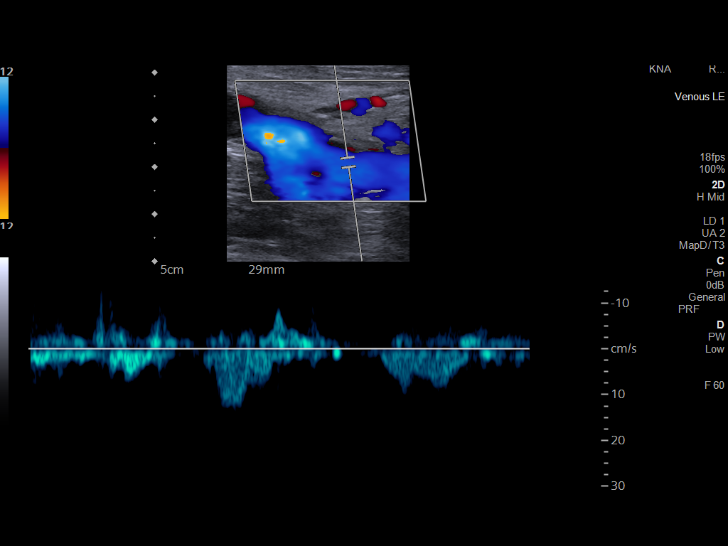
[im 22/51]
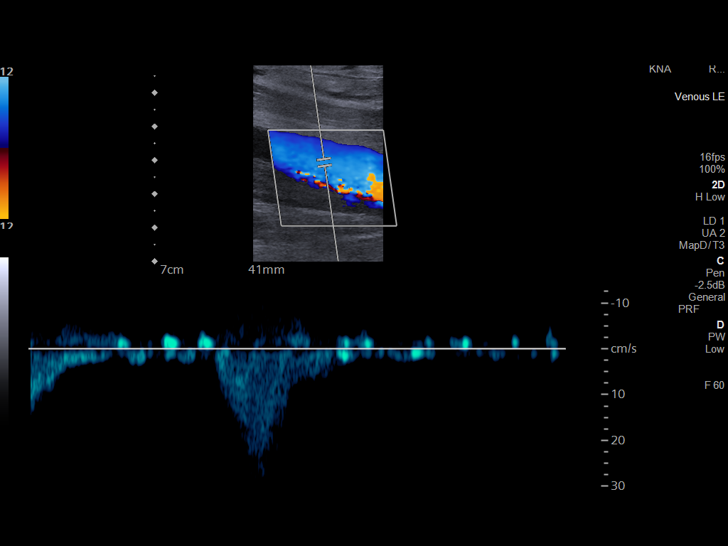
[im 27/51]
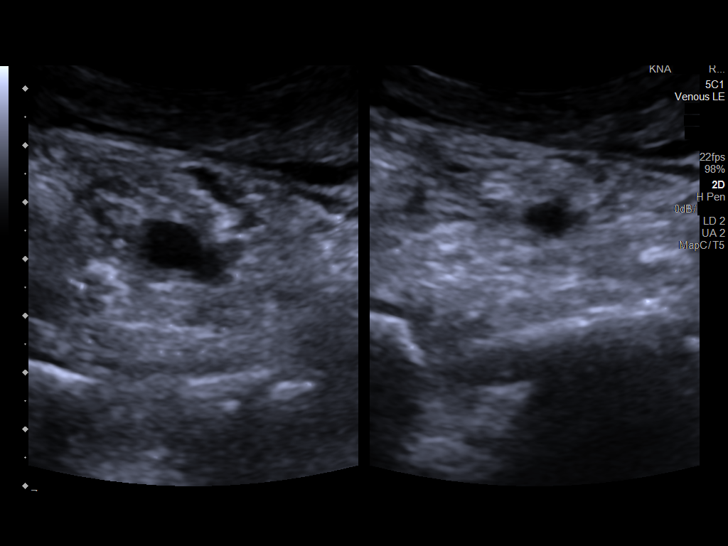
[im 29/51]
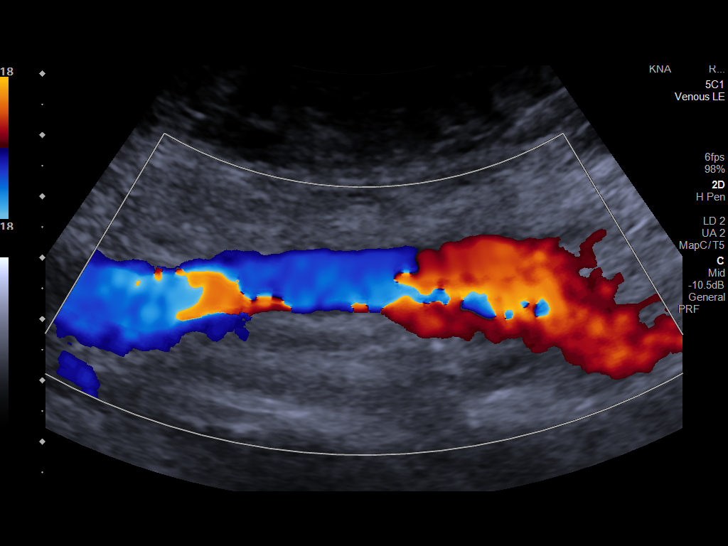
[im 33/51]
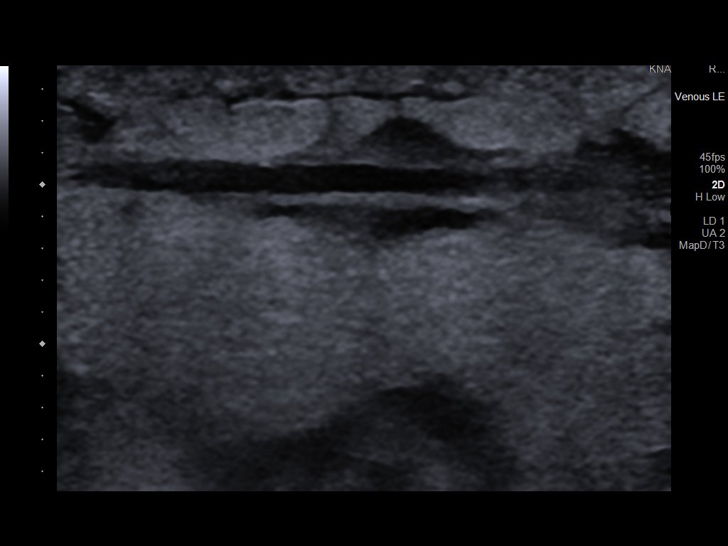
[im 37/51]
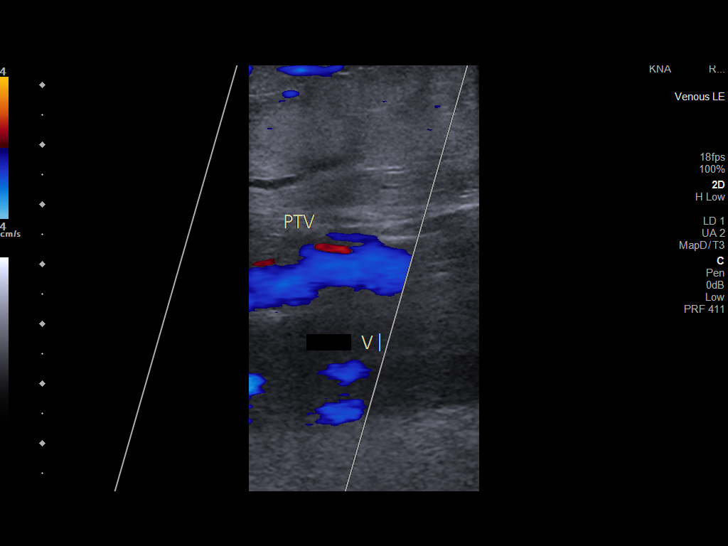
[im 42/51]
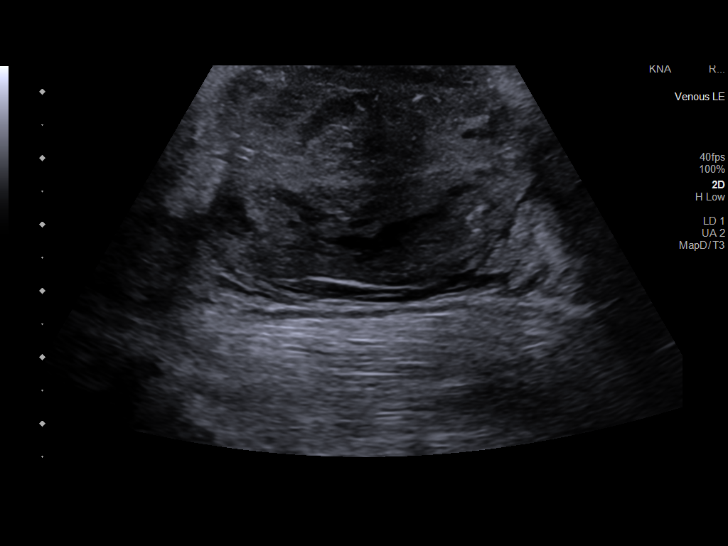
[im 46/51]
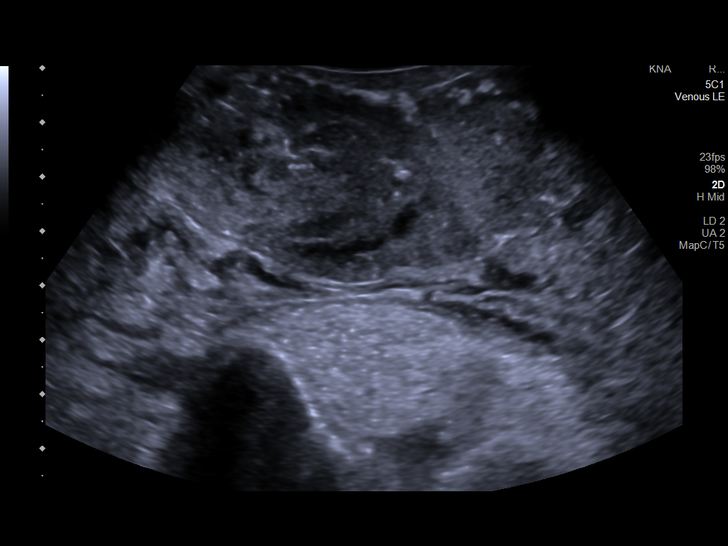
[im 51/51]
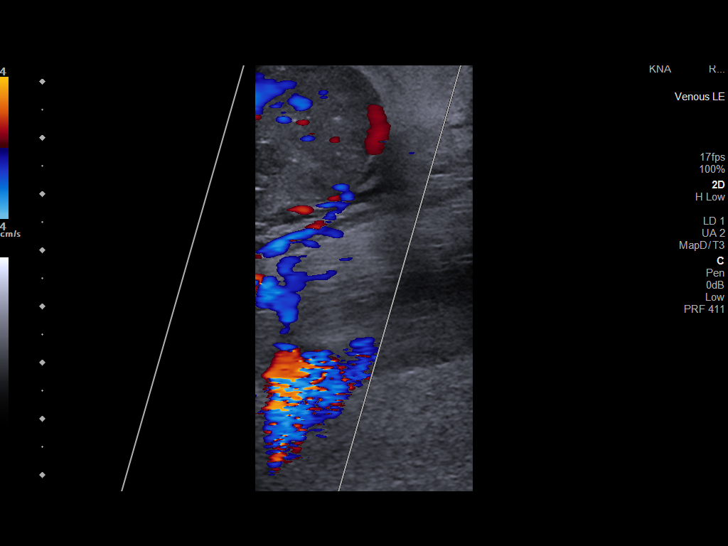

[13 of 24 positions shown; findings below may reference images not displayed]

FINDINGS: VENOUS

Normal compressibility of the common femoral, superficial femoral,
and popliteal veins, as well as the visualized calf veins.
Visualized portions of profunda femoral vein and great saphenous
vein unremarkable. No filling defects to suggest DVT on grayscale or
color Doppler imaging. Doppler waveforms show normal direction of
venous flow, normal respiratory plasticity and response to
augmentation.

Limited views of the contralateral common femoral vein are
unremarkable.

OTHER

There is a heterogeneous soft tissue mass in the anterior lower leg
measuring 7.9 x 5.4 x 3.5 cm. Mild internal vascularity.

Limitations: Limited assessment of the calf veins due to pitting
edema and body habitus.
IMPRESSION: 1. Negative for DVT in the left lower extremity. Exam is somewhat
limited particularly in the calf veins due to edema and body
habitus.

2. Indeterminate heterogeneous soft tissue mass in the anterior
lower leg measuring up to 7.9 cm. Tissue sampling may be considered.
Per patient report mass has been enlarging.

## 2021-07-23 MED ORDER — FUROSEMIDE 10 MG/ML IJ SOLN
40.0000 mg | Freq: Two times a day (BID) | INTRAMUSCULAR | Status: DC
  Administered 2021-07-23: 40 mg via INTRAVENOUS
  Filled 2021-07-23: qty 4

## 2021-07-23 MED ORDER — HYDROMORPHONE HCL 1 MG/ML IJ SOLN
0.5000 mg | INTRAMUSCULAR | Status: DC | PRN
  Administered 2021-07-23 – 2021-07-31 (×14): 0.5 mg via INTRAVENOUS
  Filled 2021-07-23: qty 0.5
  Filled 2021-07-23: qty 1
  Filled 2021-07-23 (×2): qty 0.5
  Filled 2021-07-23 (×3): qty 1
  Filled 2021-07-23: qty 0.5
  Filled 2021-07-23 (×2): qty 1
  Filled 2021-07-23: qty 0.5
  Filled 2021-07-23: qty 1
  Filled 2021-07-23: qty 0.5
  Filled 2021-07-23: qty 1

## 2021-07-23 MED ORDER — ENOXAPARIN SODIUM 40 MG/0.4ML IJ SOSY
40.0000 mg | PREFILLED_SYRINGE | Freq: Every day | INTRAMUSCULAR | Status: DC
  Administered 2021-07-23 – 2021-07-29 (×6): 40 mg via SUBCUTANEOUS
  Filled 2021-07-23 (×6): qty 0.4

## 2021-07-23 MED ORDER — FUROSEMIDE 10 MG/ML IJ SOLN
80.0000 mg | Freq: Two times a day (BID) | INTRAMUSCULAR | Status: DC
  Administered 2021-07-23 – 2021-07-24 (×2): 80 mg via INTRAVENOUS
  Filled 2021-07-23 (×2): qty 8

## 2021-07-23 MED ORDER — CALCIUM CARBONATE 1250 (500 CA) MG PO TABS: 1.0000 | ORAL_TABLET | Freq: Every day | ORAL | Status: DC

## 2021-07-23 MED ORDER — PANTOPRAZOLE SODIUM 40 MG PO TBEC
40.0000 mg | DELAYED_RELEASE_TABLET | Freq: Every day | ORAL | Status: DC
  Administered 2021-07-23 – 2021-08-20 (×29): 40 mg via ORAL
  Filled 2021-07-23 (×29): qty 1

## 2021-07-23 MED ORDER — VITAMIN D 25 MCG (1000 UNIT) PO TABS
1000.0000 [IU] | ORAL_TABLET | Freq: Every day | ORAL | Status: DC
  Administered 2021-07-23 – 2021-08-20 (×29): 1000 [IU] via ORAL
  Filled 2021-07-23 (×30): qty 1

## 2021-07-23 MED ORDER — METOLAZONE 5 MG PO TABS: 2.5000 mg | ORAL_TABLET | Freq: Every day | ORAL | Status: DC

## 2021-07-23 NOTE — Consult Note (Addendum)
Cardiology Consultation:   Patient ID: GIANNAMARIE PAULUS MRN: [id number]; DOB: [birth year]  Admit date: 07/22/2021 Date of Consult: 07/23/2021  PCP:  [name], MD   [place] [facility] Cardiologist:  Rozann Lesches, MD        Patient Profile:   Lauren Osborn is a 45 y.o. 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 Josephine Cables.  History of Present Illness:   Lauren Osborn was previously followed by our cardiology team for diastolic CHF requiring diuretics with torsemide/metolazone. Last admission was 10/20-10/25/22. Her weight was at 308 lbs on the day of discharge and creatinine was at 2.28 on 06/28/2021. Cr was stable at 1.99 on 07/14/21 at follow-up at which time she was to continue torsemide 80mg  QAM/60mg  QPM with metolazone 3x/week. She is also on spirononlactone 25mg  daily. Notes reference a prior baseline weight in the 270-280s range but I do not see this has been the case in recent times. Her lowest weight in the 2022 was 290lb in 02/2021 with peak of 352lb in 09/2020.  She has also been following with GI for post-prandial RUQ pain as well. She saw them yesterday and they ordered abdominal US. This showed cholelithiasis with some degree of mild gallbladder wall thickening, hepatic cirrhosis and moderate volume abdominopelvic ascites, 4.1 1 cm hyperechoic right hepatic dome lesion previously demonstrated be a hemangioma, query nonobstructing left nephrolithiasis. Given her ascites as well as reported 8lb weight gain in 1 week, she was recommended to go the ED. She has chronic DOE and some sensation of orthopnea. She reports compliance with CPAP. No overt chest pain. Her reported weight was  316 on admission, measured 309.8lb today. She did receive 60mg  IV Lasix yesterday transitioned to 40mg  IV BID today. She has not felt this produced very robust UOP. CXR NAD. LE duplex negative for DVT but exam limited due to edema/body habitus, "Indeterminate heterogeneous soft tissue mass in the anterior lower leg measuring up to 7.9 cm. Tissue sampling may be considered. Per patient report mass has been enlarging." 2D echo is normal with normal EF, normal diastolic parameters, mildly dilated LA. Labs notable for Cr back up to 2.7-2.8, albumin normal, TBili mid 2 range, Hgb 11 range, normal platelets, negative Covid/flu, UA negative for proteinuria.   She states she tries to avoid adding salt and limits fluid as best as she can but is working on trying to minimize the amount of sodium already added to foods. Daughter helps with grocery shopping. Regarding the abnormality seen on duplex, she reports a chronic knot on her left leg ever since she was a teenager, which also seems to enlarge when she retains fluid.  Past Medical History:  Diagnosis Date   Abnormal bilirubin test    Cholelithiasis    Chronic kidney disease, stage 3b (HCC)    Diastolic congestive heart failure (HCC)    Esophagitis    Essential hypertension    Fibroids    Gastritis    GERD (gastroesophageal reflux disease)    Gout    Liver hemangioma    Morbid obesity (HCC)    Normocytic anemia    OSA (obstructive sleep apnea)    Thyroid nodule    Tubular adenoma     Past Surgical History:  Procedure Laterality Date  BIOPSY  08/03/2020   Procedure: BIOPSY;  Surgeon: Eloise Harman, DO;  [facility];  Service: Endoscopy;;  duodenal gastric   CESAREAN SECTION     COLONOSCOPY WITH PROPOFOL N/A 08/03/2020    Surgeon: Hurshel Keys K, DO; 5 mm tubular adenoma in the sigmoid colon, nonbleeding internal hemorrhoids.  Recommended repeat in 5 years.   ESOPHAGOGASTRODUODENOSCOPY (EGD) WITH PROPOFOL N/A 08/03/2020    Surgeon: Eloise Harman, DO; LA grade C esophagitis without bleeding, gastritis with erythema and shallow ulcerations in gastric antrum biopsied (negative for H. pylori), normal examined duodenum s/p biopsy (benign).   HYSTERECTOMY ABDOMINAL WITH SALPINGECTOMY  2008   POLYPECTOMY  08/03/2020   Procedure: POLYPECTOMY;  Surgeon: Eloise Harman, DO;  Location: [facility];  Service: Endoscopy;;  colon   THYROIDECTOMY Right 10/21/2018   Procedure: RIGHT HEMITHYROIDECTOMY;  Surgeon: Leta Baptist, MD;  Location: [facility];  Service: ENT;  Laterality: Right;     Home Medications:  Prior to Admission medications   Medication Sig Start Date End Date Taking? Authorizing Provider  acetaminophen (TYLENOL) 500 MG tablet Take 1,000 mg by mouth every 6 (six) hours as needed for moderate pain or headache.   Yes [provider]  magnesium oxide (MAG-OX) 400 MG tablet TAKE 1 TABLET BY MOUTH ONCE DAILY. 01/17/21  Yes Satira Sark, MD  metolazone (ZAROXOLYN) 2.5 MG tablet Take 1 tablet (2.5 mg total) by mouth See admin instructions. 2.5 mg every M W F 06/28/21  Yes Barton Dubois, MD  omeprazole (PRILOSEC) 20 MG capsule Take 1 capsule (20 mg total) by mouth daily before breakfast. 07/22/21 01/18/22 Yes Jodi Mourning, Tivis Ringer, PA-C  potassium chloride SA (KLOR-CON) 20 MEQ tablet Take 3 tablets (60 mEq total) by mouth 3 (three) times daily. Take 2 additional Tablets on the Days That you take Metolazone 07/15/21  Yes Strader, [place], PA-C  spironolactone (ALDACTONE) 25 MG tablet Take 1 tablet (25 mg total) by mouth daily. 06/28/21  Yes Barton Dubois, MD  TART CHERRY PO Take by mouth daily.   Yes [provider]  torsemide (DEMADEX) 20 MG tablet Take 4 tablets in am and 3 tablets in the evening. 06/28/21  Yes Barton Dubois, MD    Inpatient Medications: Scheduled Meds:  cholecalciferol  1,000 Units Oral Daily   enoxaparin (LOVENOX) injection  40 mg Subcutaneous Daily    furosemide  40 mg Intravenous Q12H   pantoprazole  40 mg Oral Daily   Continuous Infusions:  PRN Meds: HYDROmorphone (DILAUDID) injection  Allergies:    Allergies  Allergen Reactions   Oxycodone-Acetaminophen Hives and Other (See Comments)    Social History:   Social History   Socioeconomic History   Marital status: Single    Spouse name: Not on file   Number of children: Not on file   Years of education: Not on file   Highest education level: Not on file  Occupational History   Not on file  Tobacco Use   Smoking status: Former    Packs/day: 0.50    Years: 20.00    Pack years: 10.00    Types: Cigarettes    Quit date: 2017    Years since quitting: 5.8   Smokeless tobacco: Never  Vaping Use   Vaping Use: Never used  Substance and Sexual Activity   Alcohol use: Not Currently   Drug use: Never   Sexual activity: Yes    Comment: hyst  Other Topics Concern   Not on  file  Social History Narrative   Not on file   Social Determinants of Health   Financial Resource Strain: Not on file  Food Insecurity: Not on file  Transportation Needs: Not on file  Physical Activity: Not on file  Stress: Not on file  Social Connections: Not on file  Intimate Partner Violence: Not on file    Family History:    Family History  Problem Relation Age of Onset   Breast cancer Mother    Other Paternal Grandfather        house fire   Other Maternal Grandmother        house fire   Congestive Heart Failure Maternal Grandfather    Colon cancer Father 78   Diverticulitis Brother    Diverticulitis Sister    Colon polyps Sister 69   Diabetes Daughter        borderline     ROS:  Please see the history of present illness.  All other ROS reviewed and negative.     Physical Exam/Data:   Vitals:   07/23/21 1045 07/23/21 1215 07/23/21 1300 07/23/21 1609  BP:   107/80 111/86  Pulse: 73 81 89 (!) 101  Resp: (!) 0 (!) 6 15 16   Temp:    (!) 97.5 F (36.4 C)  TempSrc:    Oral   SpO2: 98% 100% 100% 100%  Weight:    (!) 140.5 kg  Height:    5\' 3"  (1.6 m)    Intake/Output Summary (Last 24 hours) at 07/23/2021 1634 Last data filed at 07/23/2021 1600 Gross per 24 hour  Intake --  Output 375 ml  Net -375 ml   Last 3 Weights 07/23/2021 07/22/2021 07/22/2021  Weight (lbs) 309 lb 12.8 oz 316 lb 310 lb 3.2 oz  Weight (kg) 140.524 kg 143.337 kg 140.706 kg     Body mass index is 54.88 kg/m.  General: Morbidly obese WF in no acute distress. Head: Normocephalic, atraumatic, sclera non-icteric, no xanthomas, nares are without discharge. Neck: Negative for carotid bruits. JVP elevated, + HJR Lungs: Mild wheezing at bases, no rhonchi/rales. Breathing is unlabored. Heart: RRR S1 S2 without murmurs, rubs, or gallops.  Abdomen: Non-tender but generally distended/hardened texture, + RUQ tenderness, normoactive bowel sounds. No rebound/guarding. Extremities: No clubbing or cyanosis. Marked pitting edema 2-3+ up her leg into sacrum and back. Distal pedal pulses are in tact and equal bilaterally although challenging to fully palpate with edema. Neuro: Alert and oriented X 3. Moves all extremities spontaneously. Psych:  Responds to questions appropriately with a normal affect.  EKG:  The EKG was personally reviewed and demonstrates:  NSR 91bpm, low voltage QRS, nonspecific TW changes  Telemetry:  Telemetry was personally reviewed and demonstrates:  NSR  Relevant CV Studies: 2D echo today  1. Left ventricular ejection fraction, by estimation, is 60 to 65%. The  left ventricle has normal function. The left ventricle has no regional  wall motion abnormalities. Left ventricular diastolic parameters were  normal.   2. Right ventricular systolic function is normal. The right ventricular  size is normal.   3. Left atrial size was mildly dilated.   4. The mitral valve is normal in structure. No evidence of mitral valve  regurgitation. No evidence of mitral stenosis.   5. The  aortic valve is tricuspid. Aortic valve regurgitation is not  visualized. Aortic valve sclerosis is present, with no evidence of aortic  valve stenosis.   Laboratory Data:  High Sensitivity Troponin:  No results for  input(s): TROPONINIHS in the last 720 hours.   Chemistry Recent Labs  Lab 07/22/21 1153 07/22/21 1755 07/23/21 0551  NA 135 135 136  K 4.6 4.9 5.0  CL 96* 98 99  CO2 26 25 26   GLUCOSE 91 85 93  BUN 44* 43* 46*  CREATININE 2.76* 2.76* 2.84*  CALCIUM 9.5 9.3 9.2  MG  --   --  2.0  GFRNONAA 21* 21* 20*  ANIONGAP 13 12 11     Recent Labs  Lab 07/22/21 1153 07/22/21 1755 07/23/21 0551  PROT 7.9 7.7 7.2  ALBUMIN 4.1 4.0 3.7  AST 38 40 34  ALT 18 18 16   ALKPHOS 89 88 79  BILITOT 2.7* 2.4* 2.5*   Lipids No results for input(s): CHOL, TRIG, HDL, LABVLDL, LDLCALC, CHOLHDL in the last 168 hours.  Hematology Recent Labs  Lab 07/22/21 1153 07/22/21 1755 07/23/21 0551  WBC 6.2 7.0 5.8  RBC 3.70* 3.71* 3.51*  HGB 11.8* 11.9* 11.2*  HCT 36.5 37.1 35.1*  MCV 98.6 100.0 100.0  MCH 31.9 32.1 31.9  MCHC 32.3 32.1 31.9  RDW 15.9* 15.9* 15.9*  PLT 269 274 245   Thyroid No results for input(s): TSH, FREET4 in the last 168 hours.  BNP Recent Labs  Lab 07/22/21 1755  BNP 510.0*    DDimer No results for input(s): DDIMER in the last 168 hours.   Radiology/Studies:  US Abdomen Complete  Result Date: 07/22/2021 CLINICAL DATA:  Right upper quadrant pain history of cirrhosis and ascites EXAM: ABDOMEN ULTRASOUND COMPLETE COMPARISON:  12/17/2020 MRI FINDINGS: Gallbladder: Echogenic shadowing gallstone measures up to 3.3 cm by ultrasound. Wall thickness measures 5.8 mm. No pericholecystic fluid. Nonspecific Murphy's sign elicited over the gallbladder. Common bile duct: Diameter: 4.7 mm Liver: Increased echogenicity with surface nodularity compatible with hepatic cirrhosis. Right hepatic dome solid hyperechoic lesion measures 3.6 x 4.1 cm, previously demonstrated to be  hemangioma by MRI. Portal vein is patent on color Doppler imaging with normal direction of blood flow towards the liver. IVC: No abnormality visualized. Pancreas: Unable to visualize because of body habitus Spleen: Size and appearance within normal limits. Right Kidney: Length: 8.9 cm. Echogenicity within normal limits. No mass or hydronephrosis visualized. Left Kidney: Length: 9.6 cm. Echogenicity within normal limits. No mass or hydronephrosis. Possible small 7 mm shadowing calculus in the midpole region. Abdominal aorta: No aneurysm visualized. Other findings: Moderate volume of abdominopelvic ascites. IMPRESSION: Cholelithiasis with some degree of mild gallbladder wall thickening. No pericholecystic fluid. Nonspecific sonographic Murphy's sign elicited. Correlate clinically for cholecystitis. Hepatic cirrhosis and moderate volume of abdominopelvic ascites. 4.1 cm hyperechoic right hepatic dome lesion previously demonstrated be a hemangioma by MRI. Query subcentimeter nonobstructing left nephrolithiasis. Electronically Signed   By: Jerilynn Mages.  Shick M.D.   On: 07/22/2021 11:44   US Venous Img Lower Unilateral Left (DVT)  Result Date: 07/23/2021 CLINICAL DATA:  Left leg swelling EXAM: LEFT LOWER EXTREMITY VENOUS DOPPLER ULTRASOUND TECHNIQUE: Gray-scale sonography with compression, as well as color and duplex ultrasound, were performed to evaluate the deep venous system(s) from the level of the common femoral vein through the popliteal and proximal calf veins. COMPARISON:  None. FINDINGS: VENOUS Normal compressibility of the common femoral, superficial femoral, and popliteal veins, as well as the visualized calf veins. Visualized portions of profunda femoral vein and great saphenous vein unremarkable. No filling defects to suggest DVT on grayscale or color Doppler imaging. Doppler waveforms show normal direction of venous flow, normal respiratory plasticity and response to augmentation. Limited  views of the  contralateral common femoral vein are unremarkable. OTHER There is a heterogeneous soft tissue mass in the anterior lower leg measuring 7.9 x 5.4 x 3.5 cm. Mild internal vascularity. Limitations: Limited assessment of the calf veins due to pitting edema and body habitus. IMPRESSION: 1. Negative for DVT in the left lower extremity. Exam is somewhat limited particularly in the calf veins due to edema and body habitus. 2. Indeterminate heterogeneous soft tissue mass in the anterior lower leg measuring up to 7.9 cm. Tissue sampling may be considered. Per patient report mass has been enlarging. Electronically Signed   By: Audie Pinto M.D.   On: 07/23/2021 09:34   DG Chest Portable 1 View  Result Date: 07/22/2021 CLINICAL DATA:  Dyspnea EXAM: PORTABLE CHEST 1 VIEW COMPARISON:  06/23/2021 FINDINGS: Lungs volumes are small, but are symmetric and are clear. No pneumothorax or pleural effusion. Cardiac size within normal limits. Vascular crowding at the hila secondary to poor pulmonary insufflation. Osseous structures are age-appropriate. No acute bone abnormality. IMPRESSION: No active disease. Electronically Signed   By: Fidela Salisbury M.D.   On: 07/22/2021 22:01   ECHOCARDIOGRAM COMPLETE  Result Date: 07/23/2021    ECHOCARDIOGRAM REPORT   Patient Name:   TIFFANNIE SLOSS Date of Exam: 07/23/2021 Medical Rec #:  [id number]      Height:       63.0 in Accession #:    [id number]     Weight:       316.0 lb Date of Birth:  06-30-1976      BSA:          2.349 m Patient Age:    53 years       BP:           102/70 mmHg Patient Gender: F              HR:           91 bpm. Exam Location:  Inpatient Procedure: 2D Echo, Color Doppler and Cardiac Doppler Indications:    B15.17 Acute diastolic (congestive) heart failure  History:        Patient has prior history of Echocardiogram examinations, most                 recent 03/24/2021. CHF; Risk Factors:Hypertension and Sleep                 Apnea.  Sonographer:    Raquel Sarna Senior  RDCS Referring Phys: [id number] OLADAPO ADEFESO IMPRESSIONS  1. Left ventricular ejection fraction, by estimation, is 60 to 65%. The left ventricle has normal function. The left ventricle has no regional wall motion abnormalities. Left ventricular diastolic parameters were normal.  2. Right ventricular systolic function is normal. The right ventricular size is normal.  3. Left atrial size was mildly dilated.  4. The mitral valve is normal in structure. No evidence of mitral valve regurgitation. No evidence of mitral stenosis.  5. The aortic valve is tricuspid. Aortic valve regurgitation is not visualized. Aortic valve sclerosis is present, with no evidence of aortic valve stenosis. FINDINGS  Left Ventricle: Left ventricular ejection fraction, by estimation, is 60 to 65%. The left ventricle has normal function. The left ventricle has no regional wall motion abnormalities. The left ventricular internal cavity size was normal in size. There is  no left ventricular hypertrophy. Left ventricular diastolic parameters were normal. Right Ventricle: The right ventricular size is normal. Right ventricular systolic function is normal. Left Atrium: Left atrial size  was mildly dilated. Right Atrium: Right atrial size was normal in size. Pericardium: There is no evidence of pericardial effusion. Mitral Valve: The mitral valve is normal in structure. No evidence of mitral valve regurgitation. No evidence of mitral valve stenosis. Tricuspid Valve: The tricuspid valve is normal in structure. Tricuspid valve regurgitation is mild . No evidence of tricuspid stenosis. Aortic Valve: The aortic valve is tricuspid. Aortic valve regurgitation is not visualized. Aortic valve sclerosis is present, with no evidence of aortic valve stenosis. Pulmonic Valve: The pulmonic valve was grossly normal. Pulmonic valve regurgitation is trivial. No evidence of pulmonic stenosis. Aorta: The aortic root is normal in size and structure. Venous: The inferior  vena cava was not well visualized. IAS/Shunts: The interatrial septum was not well visualized.  LEFT VENTRICLE PLAX 2D LVIDd:         4.10 cm   Diastology LVIDs:         2.80 cm   LV e' medial:    12.20 cm/s LV PW:         0.70 cm   LV E/e' medial:  7.1 LV IVS:        0.60 cm   LV e' lateral:   12.90 cm/s LVOT diam:     1.80 cm   LV E/e' lateral: 6.7 LV SV:         46 LV SV Index:   20 LVOT Area:     2.54 cm  RIGHT VENTRICLE RV S prime:     10.20 cm/s TAPSE (M-mode): 1.6 cm LEFT ATRIUM             Index        RIGHT ATRIUM           Index LA diam:        4.90 cm 2.09 cm/m   RA Area:     22.30 cm LA Vol (A2C):   54.2 ml 23.08 ml/m  RA Volume:   70.70 ml  30.10 ml/m LA Vol (A4C):   75.9 ml 32.32 ml/m LA Biplane Vol: 65.0 ml 27.68 ml/m  AORTIC VALVE LVOT Vmax:   103.00 cm/s LVOT Vmean:  71.600 cm/s LVOT VTI:    0.181 m  AORTA Ao Root diam: 2.80 cm Ao Asc diam:  3.00 cm MITRAL VALVE               TRICUSPID VALVE MV Area (PHT): 3.30 cm    TR Peak grad:   20.4 mmHg MV Decel Time: 230 msec    TR Vmax:        226.00 cm/s MV E velocity: 86.50 cm/s MV A velocity: 55.70 cm/s  SHUNTS MV E/A ratio:  1.55        Systemic VTI:  0.18 m                            Systemic Diam: 1.80 cm Kirk Ruths MD Electronically signed by Kirk Ruths MD Signature Date/Time: 07/23/2021/2:47:50 PM    Final      Assessment and Plan:   1. Weight gain with suspected volume overload/ascites, suspect multifactorial 2. Hx of chronic HFpEF although echo without overt abormalities 3. Question of hepatic cirrhosis by abdominal US, also recent RUQ pain undergoing recent OP eval by GI 4. Morbid obesity with OSA, compliant with CPAP 5. CKD stage IV 6. Normocytic anemia, stable compared to prior  Patient seen in conjunction with Dr. Harrington Challenger who examined patient. Difficult  to attribute fluid overload completely to cardiac issues since echo is very reassuring. Possible that hepatic issues +/- OSA/severe obesity with increased metabolic  demand are contributing. We will increase Lasix to 80mg  BID and check complete thyroid function panel. We would suggest to hold off on metolazone and spironolactone for now, pending direction of renal function. Ultimately may benefit from [place] this admission to clarify volume status. Will get dietitian consult as well.   Risk Assessment/Risk Scores:       New York Heart Association (NYHA) Functional Class NYHA Class III   For questions or updates, please contact [facility] Please consult [email or web address] for contact info under    Signed, Charlie Pitter, PA-C  07/23/2021 4:34 PM  Patient seen and examined   I agree with findings as noted by D Dunn above  Pt is a 45 yo with hx of morbid obesity, HFpEF, OSA(on CPAP), CKD, anemia, liver hemangioma, cholelithiasis.  Presented to [facility] ED yesterday.  She was sent by GI yesterday   Had USN of abdomen that showed cirrrhosis, mod ascites, hemangioma on liver.  Given volume overload she was sent to ED  evaluation of CHF  Note that pt had been admitted in October with volume overload (10/20-10/25)   She says that her weight did not change by time of d/c. The pt says she is doing her best to limit salt   Daughter cooks   Says she limtds fluids  Limits sweets.   On exam, the pt is morbidly obese Neckj   JVP is increased   No bruit Lungs with wheezes at bases  Cardiac exam:   RRR  No murmurs   No S3 or S4    Abd is distended   Mild RUQ tenderness Back:   Pitting edema to mid back Ext with 2 to 3 +edema above knee   Feet warm.  2+ pulses   Note mass below L knee consistent with possible lipoma  Echo shows normal LVEF   Normal RVEF    Normal diastolic parameters  Impression:   Amasarca. Probably multifactorial   Note her albumin is normal so cannot blame on low oncotic pressure    Pt does have renal insuffiency.   Cr 2.6/2.7.   QUestion how compliant she is with fluid/salt  WIll start tonight with lasix 80 bid    Strict I/O   Follow output   Hold  metalozone for now and spiro until see what Cr does with this   Will reassess in am  Dorris Carnes MD

## 2021-07-23 NOTE — TOC Initial Note (Signed)
Transition of Care [facility]) - Initial/Assessment Note    Patient Details  Name: Lauren Osborn MRN: [id number] Date of Birth: 1975-09-10  Transition of Care [facility]) CM/SW Contact:    Iona Beard, [place] Phone Number: 07/23/2021, 11:43 AM  Clinical Narrative:                 [facility] consulted for heart failure screen. CSW spoke with pt to complete assessment. Pt states that he daughter lives with her. Pt is independent in completing her ADLs and she provides her own transportation as needed. Pt has not had an [place] services in the past and does not use any DME outside of a Cpap.  Pt states that she weighs herself daily. Pt takes all medications as prescribed by her doctors. Pt also states that she follows a heart healthy diet. Pts cardiologist is Dr. Domenic Polite. TOC to follow.   Expected Discharge Plan: Home/Self Care Barriers to Discharge: Continued Medical Work up   Patient Goals and CMS Choice Patient states their goals for this hospitalization and ongoing recovery are:: Return home CMS Medicare.gov Compare Post Acute Care list provided to:: Patient Choice offered to / list presented to : Patient  Expected Discharge Plan and Services Expected Discharge Plan: Home/Self Care In-house Referral: Clinical Social Work Discharge Planning Services: CM Consult   Living arrangements for the past 2 months: Single Family Home                                      Prior Living Arrangements/Services Living arrangements for the past 2 months: Single Family Home Lives with:: Minor Children Patient language and need for interpreter reviewed:: Yes Do you feel safe going back to the place where you live?: Yes      Need for Family Participation in Patient Care: Yes (Comment) Care giver support system in place?: Yes (comment) Current home services: DME (Cpap) Criminal Activity/Legal Involvement Pertinent to Current Situation/Hospitalization: No - Comment as needed  Activities of Daily Living       Permission Sought/Granted                  Emotional Assessment Appearance:: Appears stated age Attitude/Demeanor/Rapport: Engaged Affect (typically observed): Accepting Orientation: : Oriented to Self, Oriented to Place, Oriented to  Time, Oriented to Situation Alcohol / Substance Use: Not Applicable Psych Involvement: No (comment)  Admission diagnosis:  Acute exacerbation of CHF (congestive heart failure) (HCC) [I50.9] Patient Active Problem List   Diagnosis Date Noted   Abdominal pain 07/23/2021   Vitamin D deficiency 07/23/2021   Hepatic cirrhosis ([place]) 07/23/2021   Hepatic hemangioma 07/23/2021   Right upper quadrant abdominal pain 07/22/2021   Rectal bleeding 07/22/2021   Shortness of breath 07/22/2021   Acute exacerbation of CHF (congestive heart failure) ([place]) 07/22/2021   CKD (chronic kidney disease), stage IV ([place]) 06/23/2021   Liver lesion 12/01/2020   Liver disease 12/01/2020   Elevated bilirubin 12/01/2020   OSA (obstructive sleep apnea) 11/16/2020   Acute on chronic heart failure with preserved ejection fraction (HFpEF) ([place]) 09/22/2020   Morbid obesity with BMI of 50.0-59.9, adult ([place]) 09/22/2020   Pulmonary edema 09/16/2020   Hypokalemia 09/16/2020   Prolonged QT interval 09/16/2020   Acute kidney injury ([place]) 09/16/2020   Elevated brain natriuretic peptide (BNP) level 09/16/2020   GERD (gastroesophageal reflux disease) 09/16/2020   Anasarca 09/16/2020   Super obese 09/16/2020   Normocytic  anemia 04/23/2020   Family history of colon cancer 04/23/2020   Mass of upper outer quadrant of right breast 11/04/2019   S/P partial thyroidectomy 10/21/2018   Visit for routine gyn exam 07/03/2018   PCP:  [name], MD Pharmacy:   [facility], [place] [address] Phone: [phone] Fax: [phone]     Social Determinants of Health (SDOH) Interventions    Readmission Risk  Interventions Readmission Risk Prevention Plan 07/23/2021 06/24/2021  Transportation Screening Complete Complete  Home Care Screening Complete Complete  Medication Review (RN CM) Complete Complete  Some recent data might be hidden

## 2021-07-23 NOTE — Progress Notes (Signed)
PROGRESS NOTE    Lauren Osborn  FWY:[id number] DOB: 1976/02/13 DOA: 07/22/2021 PCP: [name], MD    Chief Complaint  Patient presents with   Shortness of Breath    Brief Narrative:  As per H&P written by Dr. Josephine Cables on 07/22/2021 Lauren Osborn is a 45 y.o. female with medical history significant for hypertension, CHF, OSA on CPAP, gout and obesity who presents to the emergency department due to worsening shortness of breath, increased leg swelling, abdominal distention and weight gain that has been ongoing for about 2 weeks.  Patient complained of 20 to 30 pound weight gain despite being compliant with home meds (torsemide 80 mg a.m/60 mg p.m and metolazone 2.5 mg 3 times weekly and spironolactone).   Patient also complained of 1 week of right upper quadrant abdominal pain which was described as sharp and worsens with eating. She followed up with her cardiologist on 11/10 and there was consideration for referring patient to advanced heart failure for any possible additional recommendation. She followed up with her gastroenterologist today (11/18) who recommended for her to go to the ED for further evaluation and management.   ED Course:  In the emergency department, BP was soft at 91/62, but other vital signs were within normal range.  Work-up in the ED showed normocytic anemia, BUN to creatinine 43/2.76, BNP 510, vitamin D14.41.  Urinalysis was negative, lipase 46.  Influenza A, B, SARS coronavirus 2 was negative. Chest x-ray showed no active disease Complete abdominal ultrasound showed cholelithiasis with some degree of mild gallbladder wall thickening.  Hepatic cirrhosis and moderate volume of abdominal pelvic ascites. 4.1 cm hyperechoic right hepatic dome lesion previously demonstrated to be a hemangioma by MRI. IV Lasix 60 mg and IV Dilaudid were given.  Cardiology at [facility] was consulted and was aware that patient will be admitted to [facility] since no cardiology at [facility] on  weekends.  Assessment & Plan: 1-acute on chronic diastolic heart failure -In the setting of diet indiscretion -Patient reports to be compliant with medication as an outpatient -Morbidly obese and was slightly with complaining of right-sided heart failure/pulmonary hypertension. -Continue IV Lasix and metolazone -Daily weights -Strict intake and output -Cardiology service has been consulted as initial plan was for referral to advanced heart failure team. -Anticipating the need of Lasix drip along with albumin if patient failed to response to single IV diuretics dosages. -Follow electrolytes and renal function.  2-right upper quadrant abdominal pain -Cholelithiasis without cholecystitis-seen -Most likely enlargement of her liver capsule due to CHF. -Normal LFTs. -Continue as needed analgesics.  3-hepatic cirrhosis -Patient has been following with [facility] gastroenterology -Normal LFTs -Receiving diuretics currently -Will need close follow-up with gastroenterology.  4-history of hepatic hemangioma -Abdominal ultrasound showed 4.1 cm hyperechoic right hepatic dome lesion previously demonstrated to be a hemangioma by MRI. -About same size; continue monitoring.  5-essential hypertension -Blood pressure soft coronary -Will be holding antihypertensive agents outside diuretics to have grown for diuresis.  6-lower extremity swelling -Left lower extremity appears to be larger than right lower extremity -DVT will be rule out by lower extremity Dopplers.  7-morbid obesity -Body mass index is 54.88 kg/m. -Low calorie diet, portion control and increase physical activity discussed with patient.  8-chronic kidney disease stage IIIb-IV -Currently transition point -Probably in the setting of poor perfusion in the setting of heart failure -Continue close monitoring of renal function trend -Continue diuresis -Follow electrolytes.  9-gastroesophageal reflux disease -Continue  PPI.  10-anemia of chronic disease -Most likely  in the setting of renal failure -No signs of overt bleeding -No requiring transfusion. -Continue to follow hemoglobin trend.   DVT prophylaxis: Lovenox Code Status: Full code Family Communication: No family at bedside. Disposition:   Status is: Inpatient    Consultants:  Cardiology service Palliative care  Procedures:  See below for x-ray report 2D echo 1. Left ventricular ejection fraction, by estimation, is 60 to 65%. The  left ventricle has normal function. The left ventricle has no regional  wall motion abnormalities. Left ventricular diastolic parameters were  normal.   2. Right ventricular systolic function is normal. The right ventricular  size is normal.   3. Left atrial size was mildly dilated.   4. The mitral valve is normal in structure. No evidence of mitral valve  regurgitation. No evidence of mitral stenosis.   5. The aortic valve is tricuspid. Aortic valve regurgitation is not  visualized. Aortic valve sclerosis is present, with no evidence of aortic  valve stenosis.  Antimicrobials:  None   Subjective: Afebrile, no chest pain, no nausea or vomiting.  Patient reports intermittent right upper quadrant that radiates across; also reporting short winded sensation with activity and positive orthopnea.  Mild difficulty speaking in full sentences appreciated on exam.  Objective: Vitals:   07/23/21 1045 07/23/21 1215 07/23/21 1300 07/23/21 1609  BP:   107/80 111/86  Pulse: 73 81 89 (!) 101  Resp: (!) 0 (!) 6 15 16   Temp:    (!) 97.5 F (36.4 C)  TempSrc:    Oral  SpO2: 98% 100% 100% 100%  Weight:    (!) 140.5 kg  Height:    5\' 3"  (1.6 m)    Intake/Output Summary (Last 24 hours) at 07/23/2021 1704 Last data filed at 07/23/2021 1600 Gross per 24 hour  Intake --  Output 375 ml  Net -375 ml   Filed Weights   07/22/21 1745 07/23/21 1609  Weight: (!) 143.3 kg (!) 140.5 kg    Examination:  General  exam: Having difficulty speaking in full sentences, no fever, reports no chest pain.  Positive orthopnea. Respiratory system: Decreased breath sounds at the bases, positive expiratory wheezing, positive tachypnea with minimal exertion. Cardiovascular system: S1 & S2 heard, no rubs, no gallops, unable to assess JVD with body habitus. Gastrointestinal system: Abdomen is obese, nondistended, soft and mildly tender to palpation in her right upper quadrant. Normal bowel sounds heard. Central nervous system: Alert and oriented. No focal neurological deficits. Extremities: 3-4+ edema bilaterally; nontender to palpation in her lower extremities.  There is component of lymphedema. Skin: No petechiae. Psychiatry: Judgement and insight appear normal. Mood & affect appropriate.   Data Reviewed: I have personally reviewed following labs and imaging studies  CBC: Recent Labs  Lab 07/22/21 1144 07/22/21 1153 07/22/21 1755 07/23/21 0551  WBC 6.0 6.2 7.0 5.8  NEUTROABS 4.4 4.5 5.2  --   HGB 11.9* 11.8* 11.9* 11.2*  HCT 37.0 36.5 37.1 35.1*  MCV 97.6 98.6 100.0 100.0  PLT 265 269 274 825    Basic Metabolic Panel: Recent Labs  Lab 07/22/21 1144 07/22/21 1148 07/22/21 1153 07/22/21 1755 07/23/21 0551  NA 137 136 135 135 136  K 4.7 4.6 4.6 4.9 5.0  CL 98 97* 96* 98 99  CO2 27 27 26 25 26   GLUCOSE 93 92 91 85 93  BUN 44* 44* 44* 43* 46*  CREATININE 2.75* 2.76* 2.76* 2.76* 2.84*  CALCIUM 9.7 9.6 9.5 9.3 9.2  MG  --   --   --   --  2.0  PHOS  --  4.1  --   --  4.4   GFR: Estimated Creatinine Clearance: 33.9 mL/min (A) (by C-G formula based on SCr of 2.84 mg/dL (H)).  Liver Function Tests: Recent Labs  Lab 07/22/21 1144 07/22/21 1148 07/22/21 1153 07/22/21 1755 07/23/21 0551  AST 38  --  38 40 34  ALT 18  --  18 18 16   ALKPHOS 84  --  89 88 79  BILITOT 2.7*  --  2.7* 2.4* 2.5*  PROT 7.8  --  7.9 7.7 7.2  ALBUMIN 4.1 4.0 4.1 4.0 3.7    CBG: No results for input(s): GLUCAP in  the last 168 hours.  Recent Results (from the past 240 hour(s))  Resp Panel by RT-PCR (Flu A&B, Covid) Nasopharyngeal Swab     Status: None   Collection Time: 07/22/21  9:54 PM   Specimen: Nasopharyngeal Swab; Nasopharyngeal(NP) swabs in vial transport medium  Result Value Ref Range Status   SARS Coronavirus 2 by RT PCR NEGATIVE NEGATIVE Final    Comment: (NOTE) SARS-CoV-2 target nucleic acids are NOT DETECTED.  The SARS-CoV-2 RNA is generally detectable in upper respiratory specimens during the acute phase of infection. The lowest concentration of SARS-CoV-2 viral copies this assay can detect is 138 copies/mL. A negative result does not preclude SARS-Cov-2 infection and should not be used as the sole basis for treatment or other patient management decisions. A negative result may occur with  improper specimen collection/handling, submission of specimen other than nasopharyngeal swab, presence of viral mutation(s) within the areas targeted by this assay, and inadequate number of viral copies(<138 copies/mL). A negative result must be combined with clinical observations, patient history, and epidemiological information. The expected result is Negative.  Fact Sheet for Patients:  EntrepreneurPulse.com.au  Fact Sheet for Healthcare Providers:  IncredibleEmployment.be  This test is no t yet approved or cleared by the [place] FDA and  has been authorized for detection and/or diagnosis of SARS-CoV-2 by FDA under an Emergency Use Authorization (EUA). This EUA will remain  in effect (meaning this test can be used) for the duration of the COVID-19 declaration under Section 564(b)(1) of the Act, 21 U.S.C.section 360bbb-3(b)(1), unless the authorization is terminated  or revoked sooner.       Influenza A by PCR NEGATIVE NEGATIVE Final   Influenza B by PCR NEGATIVE NEGATIVE Final    Comment: (NOTE) The Xpert Xpress SARS-CoV-2/FLU/RSV plus assay  is intended as an aid in the diagnosis of influenza from Nasopharyngeal swab specimens and should not be used as a sole basis for treatment. Nasal washings and aspirates are unacceptable for Xpert Xpress SARS-CoV-2/FLU/RSV testing.  Fact Sheet for Patients: EntrepreneurPulse.com.au  Fact Sheet for Healthcare Providers: IncredibleEmployment.be  This test is not yet approved or cleared by the [place] FDA and has been authorized for detection and/or diagnosis of SARS-CoV-2 by FDA under an Emergency Use Authorization (EUA). This EUA will remain in effect (meaning this test can be used) for the duration of the COVID-19 declaration under Section 564(b)(1) of the Act, 21 U.S.C. section 360bbb-3(b)(1), unless the authorization is terminated or revoked.  Performed at [facility], [address], [place], [place] [address]      Radiology Studies: US Abdomen Complete  Result Date: 07/22/2021 CLINICAL DATA:  Right upper quadrant pain history of cirrhosis and ascites EXAM: ABDOMEN ULTRASOUND COMPLETE COMPARISON:  12/17/2020 MRI FINDINGS: Gallbladder: Echogenic shadowing gallstone measures up to 3.3 cm by ultrasound. Wall thickness measures 5.8 mm.  No pericholecystic fluid. Nonspecific Murphy's sign elicited over the gallbladder. Common bile duct: Diameter: 4.7 mm Liver: Increased echogenicity with surface nodularity compatible with hepatic cirrhosis. Right hepatic dome solid hyperechoic lesion measures 3.6 x 4.1 cm, previously demonstrated to be hemangioma by MRI. Portal vein is patent on color Doppler imaging with normal direction of blood flow towards the liver. IVC: No abnormality visualized. Pancreas: Unable to visualize because of body habitus Spleen: Size and appearance within normal limits. Right Kidney: Length: 8.9 cm. Echogenicity within normal limits. No mass or hydronephrosis visualized. Left Kidney: Length: 9.6 cm. Echogenicity within normal limits.  No mass or hydronephrosis. Possible small 7 mm shadowing calculus in the midpole region. Abdominal aorta: No aneurysm visualized. Other findings: Moderate volume of abdominopelvic ascites. IMPRESSION: Cholelithiasis with some degree of mild gallbladder wall thickening. No pericholecystic fluid. Nonspecific sonographic Murphy's sign elicited. Correlate clinically for cholecystitis. Hepatic cirrhosis and moderate volume of abdominopelvic ascites. 4.1 cm hyperechoic right hepatic dome lesion previously demonstrated be a hemangioma by MRI. Query subcentimeter nonobstructing left nephrolithiasis. Electronically Signed   By: Jerilynn Mages.  Shick M.D.   On: 07/22/2021 11:44   US Venous Img Lower Unilateral Left (DVT)  Result Date: 07/23/2021 CLINICAL DATA:  Left leg swelling EXAM: LEFT LOWER EXTREMITY VENOUS DOPPLER ULTRASOUND TECHNIQUE: Gray-scale sonography with compression, as well as color and duplex ultrasound, were performed to evaluate the deep venous system(s) from the level of the common femoral vein through the popliteal and proximal calf veins. COMPARISON:  None. FINDINGS: VENOUS Normal compressibility of the common femoral, superficial femoral, and popliteal veins, as well as the visualized calf veins. Visualized portions of profunda femoral vein and great saphenous vein unremarkable. No filling defects to suggest DVT on grayscale or color Doppler imaging. Doppler waveforms show normal direction of venous flow, normal respiratory plasticity and response to augmentation. Limited views of the contralateral common femoral vein are unremarkable. OTHER There is a heterogeneous soft tissue mass in the anterior lower leg measuring 7.9 x 5.4 x 3.5 cm. Mild internal vascularity. Limitations: Limited assessment of the calf veins due to pitting edema and body habitus. IMPRESSION: 1. Negative for DVT in the left lower extremity. Exam is somewhat limited particularly in the calf veins due to edema and body habitus. 2.  Indeterminate heterogeneous soft tissue mass in the anterior lower leg measuring up to 7.9 cm. Tissue sampling may be considered. Per patient report mass has been enlarging. Electronically Signed   By: Audie Pinto M.D.   On: 07/23/2021 09:34   DG Chest Portable 1 View  Result Date: 07/22/2021 CLINICAL DATA:  Dyspnea EXAM: PORTABLE CHEST 1 VIEW COMPARISON:  06/23/2021 FINDINGS: Lungs volumes are small, but are symmetric and are clear. No pneumothorax or pleural effusion. Cardiac size within normal limits. Vascular crowding at the hila secondary to poor pulmonary insufflation. Osseous structures are age-appropriate. No acute bone abnormality. IMPRESSION: No active disease. Electronically Signed   By: Fidela Salisbury M.D.   On: 07/22/2021 22:01   ECHOCARDIOGRAM COMPLETE  Result Date: 07/23/2021    ECHOCARDIOGRAM REPORT   Patient Name:   NAVIYAH SCHAFFERT Date of Exam: 07/23/2021 Medical Rec #:  [id number]      Height:       63.0 in Accession #:    [id number]     Weight:       316.0 lb Date of Birth:  [birth year]      BSA:          2.349 m Patient Age:  45 years       BP:           102/70 mmHg Patient Gender: F              HR:           91 bpm. Exam Location:  Inpatient Procedure: 2D Echo, Color Doppler and Cardiac Doppler Indications:    A41.66 Acute diastolic (congestive) heart failure  History:        Patient has prior history of Echocardiogram examinations, most                 recent 03/24/2021. CHF; Risk Factors:Hypertension and Sleep                 Apnea.  Sonographer:    Raquel Sarna Senior RDCS Referring Phys: [id number] OLADAPO ADEFESO IMPRESSIONS  1. Left ventricular ejection fraction, by estimation, is 60 to 65%. The left ventricle has normal function. The left ventricle has no regional wall motion abnormalities. Left ventricular diastolic parameters were normal.  2. Right ventricular systolic function is normal. The right ventricular size is normal.  3. Left atrial size was mildly dilated.  4. The mitral  valve is normal in structure. No evidence of mitral valve regurgitation. No evidence of mitral stenosis.  5. The aortic valve is tricuspid. Aortic valve regurgitation is not visualized. Aortic valve sclerosis is present, with no evidence of aortic valve stenosis. FINDINGS  Left Ventricle: Left ventricular ejection fraction, by estimation, is 60 to 65%. The left ventricle has normal function. The left ventricle has no regional wall motion abnormalities. The left ventricular internal cavity size was normal in size. There is  no left ventricular hypertrophy. Left ventricular diastolic parameters were normal. Right Ventricle: The right ventricular size is normal. Right ventricular systolic function is normal. Left Atrium: Left atrial size was mildly dilated. Right Atrium: Right atrial size was normal in size. Pericardium: There is no evidence of pericardial effusion. Mitral Valve: The mitral valve is normal in structure. No evidence of mitral valve regurgitation. No evidence of mitral valve stenosis. Tricuspid Valve: The tricuspid valve is normal in structure. Tricuspid valve regurgitation is mild . No evidence of tricuspid stenosis. Aortic Valve: The aortic valve is tricuspid. Aortic valve regurgitation is not visualized. Aortic valve sclerosis is present, with no evidence of aortic valve stenosis. Pulmonic Valve: The pulmonic valve was grossly normal. Pulmonic valve regurgitation is trivial. No evidence of pulmonic stenosis. Aorta: The aortic root is normal in size and structure. Venous: The inferior vena cava was not well visualized. IAS/Shunts: The interatrial septum was not well visualized.  LEFT VENTRICLE PLAX 2D LVIDd:         4.10 cm   Diastology LVIDs:         2.80 cm   LV e' medial:    12.20 cm/s LV PW:         0.70 cm   LV E/e' medial:  7.1 LV IVS:        0.60 cm   LV e' lateral:   12.90 cm/s LVOT diam:     1.80 cm   LV E/e' lateral: 6.7 LV SV:         46 LV SV Index:   20 LVOT Area:     2.54 cm  RIGHT  VENTRICLE RV S prime:     10.20 cm/s TAPSE (M-mode): 1.6 cm LEFT ATRIUM             Index  RIGHT ATRIUM           Index LA diam:        4.90 cm 2.09 cm/m   RA Area:     22.30 cm LA Vol (A2C):   54.2 ml 23.08 ml/m  RA Volume:   70.70 ml  30.10 ml/m LA Vol (A4C):   75.9 ml 32.32 ml/m LA Biplane Vol: 65.0 ml 27.68 ml/m  AORTIC VALVE LVOT Vmax:   103.00 cm/s LVOT Vmean:  71.600 cm/s LVOT VTI:    0.181 m  AORTA Ao Root diam: 2.80 cm Ao Asc diam:  3.00 cm MITRAL VALVE               TRICUSPID VALVE MV Area (PHT): 3.30 cm    TR Peak grad:   20.4 mmHg MV Decel Time: 230 msec    TR Vmax:        226.00 cm/s MV E velocity: 86.50 cm/s MV A velocity: 55.70 cm/s  SHUNTS MV E/A ratio:  1.55        Systemic VTI:  0.18 m                            Systemic Diam: 1.80 cm Kirk Ruths MD Electronically signed by Kirk Ruths MD Signature Date/Time: 07/23/2021/2:47:50 PM    Final      Scheduled Meds:  cholecalciferol  1,000 Units Oral Daily   enoxaparin (LOVENOX) injection  40 mg Subcutaneous Daily   furosemide  40 mg Intravenous Q12H   pantoprazole  40 mg Oral Daily   Continuous Infusions:   LOS: 1 day    Time spent: 35 minutes.    Barton Dubois, MD [facility]   To contact the attending provider between 7A-7P or the covering provider during after hours 7P-7A, please log into the web site [email or web address] and access using universal [place] password for that web site. If you do not have the password, please call the hospital operator.  07/23/2021, 5:04 PM

## 2021-07-24 DIAGNOSIS — N184 Chronic kidney disease, stage 4 (severe): Secondary | ICD-10-CM | POA: Diagnosis not present

## 2021-07-24 DIAGNOSIS — Z7189 Other specified counseling: Secondary | ICD-10-CM

## 2021-07-24 DIAGNOSIS — R0600 Dyspnea, unspecified: Secondary | ICD-10-CM | POA: Diagnosis not present

## 2021-07-24 DIAGNOSIS — M7989 Other specified soft tissue disorders: Secondary | ICD-10-CM | POA: Diagnosis not present

## 2021-07-24 DIAGNOSIS — R7989 Other specified abnormal findings of blood chemistry: Secondary | ICD-10-CM | POA: Diagnosis not present

## 2021-07-24 DIAGNOSIS — R601 Generalized edema: Secondary | ICD-10-CM | POA: Diagnosis not present

## 2021-07-24 DIAGNOSIS — I5033 Acute on chronic diastolic (congestive) heart failure: Secondary | ICD-10-CM | POA: Diagnosis not present

## 2021-07-24 LAB — SODIUM, URINE, RANDOM: Sodium, Ur: <10 mmol/L

## 2021-07-24 LAB — BASIC METABOLIC PANEL
Anion gap: 10 (ref 5–15)
BUN: 44 mg/dL — ABNORMAL HIGH (ref 6–20)
CO2: 26 mmol/L (ref 22–32)
Calcium: 9.3 mg/dL (ref 8.9–10.3)
Chloride: 99 mmol/L (ref 98–111)
Creatinine, Ser: 3.21 mg/dL — ABNORMAL HIGH (ref 0.44–1.00)
GFR, Estimated: 17 mL/min — ABNORMAL LOW (ref >60)
Glucose, Bld: 97 mg/dL (ref 70–99)
Potassium: 4.3 mmol/L (ref 3.5–5.1)
Sodium: 135 mmol/L (ref 135–145)

## 2021-07-24 LAB — T4, FREE: Free T4: 1.49 ng/dL — ABNORMAL HIGH (ref 0.61–1.12)

## 2021-07-24 MED ORDER — CAMPHOR-MENTHOL 0.5-0.5 % EX LOTN
TOPICAL_LOTION | CUTANEOUS | Status: DC | PRN
  Filled 2021-07-24: qty 222

## 2021-07-24 NOTE — Progress Notes (Signed)
Progress Note  Patient Name: Lauren Osborn Date of Encounter: 07/24/2021  [facility] Cardiologist: Rozann Lesches, MD   Subjective   No CP No SOB in bed   Hasnt urinated much  Inpatient Medications    Scheduled Meds:  cholecalciferol  1,000 Units Oral Daily   enoxaparin (LOVENOX) injection  40 mg Subcutaneous Daily   pantoprazole  40 mg Oral Daily   Continuous Infusions:  PRN Meds: HYDROmorphone (DILAUDID) injection   Vital Signs    Vitals:   07/23/21 2001 07/24/21 0452 07/24/21 0800 07/24/21 0805  BP: 92/61 99/78 112/84   Pulse: 92 83 92   Resp: 18 18 19    Temp: 97.6 F (36.4 C) 97.6 F (36.4 C) (!) 97.2 F (36.2 C)   TempSrc: Oral Oral Oral   SpO2: 100%  90% 96%  Weight:  (!) 140.5 kg    Height:        Intake/Output Summary (Last 24 hours) at 07/24/2021 1035 Last data filed at 07/24/2021 0936 Gross per 24 hour  Intake 480 ml  Output 1075 ml  Net -595 ml   Last 3 Weights 07/24/2021 07/23/2021 07/22/2021  Weight (lbs) 309 lb 11.2 oz 309 lb 12.8 oz 316 lb  Weight (kg) 140.479 kg 140.524 kg 143.337 kg      Telemetry    SR  - Personally Reviewed  ECG    No new  - Personally Reviewed  Physical Exam   GEN: Morbidly obese 45 yo in no acute distress.   Neck: Neck full   Cardiac: RRR, no murmurs, = Respiratory: Clear to auscultation bilaterally this am   GI: OBese   Distended   Back  Edema pitting  MS: 3+   edema; No deformity. Neuro:  Nonfocal  Psych: Normal affect   Labs    High Sensitivity Troponin:  No results for input(s): TROPONINIHS in the last 720 hours.   Chemistry Recent Labs  Lab 07/22/21 1153 07/22/21 1755 07/23/21 0551 07/24/21 0210  NA 135 135 136 135  K 4.6 4.9 5.0 4.3  CL 96* 98 99 99  CO2 26 25 26 26   GLUCOSE 91 85 93 97  BUN 44* 43* 46* 44*  CREATININE 2.76* 2.76* 2.84* 3.21*  CALCIUM 9.5 9.3 9.2 9.3  MG  --   --  2.0  --   PROT 7.9 7.7 7.2  --   ALBUMIN 4.1 4.0 3.7  --   AST 38 40 34  --   ALT 18 18 16    --   ALKPHOS 89 88 79  --   BILITOT 2.7* 2.4* 2.5*  --   GFRNONAA 21* 21* 20* 17*  ANIONGAP 13 12 11 10     Lipids No results for input(s): CHOL, TRIG, HDL, LABVLDL, LDLCALC, CHOLHDL in the last 168 hours.  Hematology Recent Labs  Lab 07/22/21 1153 07/22/21 1755 07/23/21 0551  WBC 6.2 7.0 5.8  RBC 3.70* 3.71* 3.51*  HGB 11.8* 11.9* 11.2*  HCT 36.5 37.1 35.1*  MCV 98.6 100.0 100.0  MCH 31.9 32.1 31.9  MCHC 32.3 32.1 31.9  RDW 15.9* 15.9* 15.9*  PLT 269 274 245   Thyroid  Recent Labs  Lab 07/23/21 1721 07/24/21 0812  TSH 5.189*  --   FREET4 1.42* 1.49*    BNP Recent Labs  Lab 07/22/21 1755  BNP 510.0*    DDimer No results for input(s): DDIMER in the last 168 hours.   Radiology    US Abdomen Complete  Result Date: 07/22/2021  CLINICAL DATA:  Right upper quadrant pain history of cirrhosis and ascites EXAM: ABDOMEN ULTRASOUND COMPLETE COMPARISON:  12/17/2020 MRI FINDINGS: Gallbladder: Echogenic shadowing gallstone measures up to 3.3 cm by ultrasound. Wall thickness measures 5.8 mm. No pericholecystic fluid. Nonspecific Murphy's sign elicited over the gallbladder. Common bile duct: Diameter: 4.7 mm Liver: Increased echogenicity with surface nodularity compatible with hepatic cirrhosis. Right hepatic dome solid hyperechoic lesion measures 3.6 x 4.1 cm, previously demonstrated to be hemangioma by MRI. Portal vein is patent on color Doppler imaging with normal direction of blood flow towards the liver. IVC: No abnormality visualized. Pancreas: Unable to visualize because of body habitus Spleen: Size and appearance within normal limits. Right Kidney: Length: 8.9 cm. Echogenicity within normal limits. No mass or hydronephrosis visualized. Left Kidney: Length: 9.6 cm. Echogenicity within normal limits. No mass or hydronephrosis. Possible small 7 mm shadowing calculus in the midpole region. Abdominal aorta: No aneurysm visualized. Other findings: Moderate volume of abdominopelvic  ascites. IMPRESSION: Cholelithiasis with some degree of mild gallbladder wall thickening. No pericholecystic fluid. Nonspecific sonographic Murphy's sign elicited. Correlate clinically for cholecystitis. Hepatic cirrhosis and moderate volume of abdominopelvic ascites. 4.1 cm hyperechoic right hepatic dome lesion previously demonstrated be a hemangioma by MRI. Query subcentimeter nonobstructing left nephrolithiasis. Electronically Signed   By: Jerilynn Mages.  Shick M.D.   On: 07/22/2021 11:44   US Venous Img Lower Unilateral Left (DVT)  Result Date: 07/23/2021 CLINICAL DATA:  Left leg swelling EXAM: LEFT LOWER EXTREMITY VENOUS DOPPLER ULTRASOUND TECHNIQUE: Gray-scale sonography with compression, as well as color and duplex ultrasound, were performed to evaluate the deep venous system(s) from the level of the common femoral vein through the popliteal and proximal calf veins. COMPARISON:  None. FINDINGS: VENOUS Normal compressibility of the common femoral, superficial femoral, and popliteal veins, as well as the visualized calf veins. Visualized portions of profunda femoral vein and great saphenous vein unremarkable. No filling defects to suggest DVT on grayscale or color Doppler imaging. Doppler waveforms show normal direction of venous flow, normal respiratory plasticity and response to augmentation. Limited views of the contralateral common femoral vein are unremarkable. OTHER There is a heterogeneous soft tissue mass in the anterior lower leg measuring 7.9 x 5.4 x 3.5 cm. Mild internal vascularity. Limitations: Limited assessment of the calf veins due to pitting edema and body habitus. IMPRESSION: 1. Negative for DVT in the left lower extremity. Exam is somewhat limited particularly in the calf veins due to edema and body habitus. 2. Indeterminate heterogeneous soft tissue mass in the anterior lower leg measuring up to 7.9 cm. Tissue sampling may be considered. Per patient report mass has been enlarging. Electronically  Signed   By: Audie Pinto M.D.   On: 07/23/2021 09:34   DG Chest Portable 1 View  Result Date: 07/22/2021 CLINICAL DATA:  Dyspnea EXAM: PORTABLE CHEST 1 VIEW COMPARISON:  06/23/2021 FINDINGS: Lungs volumes are small, but are symmetric and are clear. No pneumothorax or pleural effusion. Cardiac size within normal limits. Vascular crowding at the hila secondary to poor pulmonary insufflation. Osseous structures are age-appropriate. No acute bone abnormality. IMPRESSION: No active disease. Electronically Signed   By: Fidela Salisbury M.D.   On: 07/22/2021 22:01   ECHOCARDIOGRAM COMPLETE  Result Date: 07/23/2021    ECHOCARDIOGRAM REPORT   Patient Name:   TELICIA HODGKISS Date of Exam: 07/23/2021 Medical Rec #:  [id number]      Height:       63.0 in Accession #:    [id number]  Weight:       316.0 lb Date of Birth:  March 13, 1976      BSA:          2.349 m Patient Age:    5 years       BP:           102/70 mmHg Patient Gender: F              HR:           91 bpm. Exam Location:  Inpatient Procedure: 2D Echo, Color Doppler and Cardiac Doppler Indications:    I09.73 Acute diastolic (congestive) heart failure  History:        Patient has prior history of Echocardiogram examinations, most                 recent 03/24/2021. CHF; Risk Factors:Hypertension and Sleep                 Apnea.  Sonographer:    Raquel Sarna Senior RDCS Referring Phys: [id number] OLADAPO ADEFESO IMPRESSIONS  1. Left ventricular ejection fraction, by estimation, is 60 to 65%. The left ventricle has normal function. The left ventricle has no regional wall motion abnormalities. Left ventricular diastolic parameters were normal.  2. Right ventricular systolic function is normal. The right ventricular size is normal.  3. Left atrial size was mildly dilated.  4. The mitral valve is normal in structure. No evidence of mitral valve regurgitation. No evidence of mitral stenosis.  5. The aortic valve is tricuspid. Aortic valve regurgitation is not visualized.  Aortic valve sclerosis is present, with no evidence of aortic valve stenosis. FINDINGS  Left Ventricle: Left ventricular ejection fraction, by estimation, is 60 to 65%. The left ventricle has normal function. The left ventricle has no regional wall motion abnormalities. The left ventricular internal cavity size was normal in size. There is  no left ventricular hypertrophy. Left ventricular diastolic parameters were normal. Right Ventricle: The right ventricular size is normal. Right ventricular systolic function is normal. Left Atrium: Left atrial size was mildly dilated. Right Atrium: Right atrial size was normal in size. Pericardium: There is no evidence of pericardial effusion. Mitral Valve: The mitral valve is normal in structure. No evidence of mitral valve regurgitation. No evidence of mitral valve stenosis. Tricuspid Valve: The tricuspid valve is normal in structure. Tricuspid valve regurgitation is mild . No evidence of tricuspid stenosis. Aortic Valve: The aortic valve is tricuspid. Aortic valve regurgitation is not visualized. Aortic valve sclerosis is present, with no evidence of aortic valve stenosis. Pulmonic Valve: The pulmonic valve was grossly normal. Pulmonic valve regurgitation is trivial. No evidence of pulmonic stenosis. Aorta: The aortic root is normal in size and structure. Venous: The inferior vena cava was not well visualized. IAS/Shunts: The interatrial septum was not well visualized.  LEFT VENTRICLE PLAX 2D LVIDd:         4.10 cm   Diastology LVIDs:         2.80 cm   LV e' medial:    12.20 cm/s LV PW:         0.70 cm   LV E/e' medial:  7.1 LV IVS:        0.60 cm   LV e' lateral:   12.90 cm/s LVOT diam:     1.80 cm   LV E/e' lateral: 6.7 LV SV:         46 LV SV Index:   20 LVOT Area:     2.54 cm  RIGHT VENTRICLE RV S prime:     10.20 cm/s TAPSE (M-mode): 1.6 cm LEFT ATRIUM             Index        RIGHT ATRIUM           Index LA diam:        4.90 cm 2.09 cm/m   RA Area:     22.30 cm LA  Vol (A2C):   54.2 ml 23.08 ml/m  RA Volume:   70.70 ml  30.10 ml/m LA Vol (A4C):   75.9 ml 32.32 ml/m LA Biplane Vol: 65.0 ml 27.68 ml/m  AORTIC VALVE LVOT Vmax:   103.00 cm/s LVOT Vmean:  71.600 cm/s LVOT VTI:    0.181 m  AORTA Ao Root diam: 2.80 cm Ao Asc diam:  3.00 cm MITRAL VALVE               TRICUSPID VALVE MV Area (PHT): 3.30 cm    TR Peak grad:   20.4 mmHg MV Decel Time: 230 msec    TR Vmax:        226.00 cm/s MV E velocity: 86.50 cm/s MV A velocity: 55.70 cm/s  SHUNTS MV E/A ratio:  1.55        Systemic VTI:  0.18 m                            Systemic Diam: 1.80 cm Kirk Ruths MD Electronically signed by Kirk Ruths MD Signature Date/Time: 07/23/2021/2:47:50 PM    Final     Cardiac Studies   11/19/122 eft ventricular ejection fraction, by estimation, is 60 to 65%. The left ventricle has normal function. The left ventricle has no regional wall motion abnormalities. Left ventricular diastolic parameters were normal. 1. 2. Right ventricular systolic function is normal. The right ventricular size is normal. 3. Left atrial size was mildly dilated. The mitral valve is normal in structure. No evidence of mitral valve regurgitation. No evidence of mitral stenosis. 4. The aortic valve is tricuspid. Aortic valve regurgitation is not visualized. Aortic valve sclerosis is present, with no evidence of aortic valve stenosis.  Patient Profile  a 45 y.o. 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 Josephine Cables  Assessment & Plan    1  Anasarca  Echo from yesterday really appeas normal    Labs:  Albumin normal   Pt did not respond much to lasix yesterday  ANd Cr bumped this AM to 3.2    I would  recomm contacting nephrology for guidance   Held back on metolazone and spironolactone yesterday to watch Cr      Dietary has been consulted to review diet, low Na, low carb  2  OSA  COntinue CPAP   3  Hepatic   Pt with cirrhosis and moderate ascites   Hemangioma liver Old    May need to consider paracentesis.       For questions or updates, please contact [place] Please consult [email or web address] for contact info under        Signed, Dorris Carnes, MD  07/24/2021, 10:35 AM

## 2021-07-24 NOTE — Progress Notes (Addendum)
[facility] PROGRESS NOTE    Progress Note  Lauren Osborn  NID:[id number] DOB: 1976-08-29 DOA: 07/22/2021 PCP: [name], MD     Brief Narrative:   Lauren Osborn is an 45 y.o. female past medical history significant for essential hypertension, hepatic cirrhosis severe morbid obesity, severe Obstructive Sleep Apnea, chronic kidney disease stage III-IV who presents to the emergency room due to shortness of breath and increased lower extremity swelling along with abdominal distention and weight gain for about 2 weeks about 20 to 30 pounds.  She has also been complaining of 1 week of right upper quadrant abdominal pain    Assessment/Plan:   Anasarca of unclear etiology  She was started on IV Lasix,metolazone and Aldactone were held, continue daily weights and strict I's and O's. She is massively fluid overloaded she has edema up to her rib cage. I am concerned about pulmonary hypertension and/or right-sided heart failure she will probably need a right heart cath.  Her albumin is 4.  Also hepatorenal syndrome is in the differential as she has massive ascites and a history of cirrhosis. She is negative about 700 cc, creatinine has increased significantly we will hold on Lasix. Check urinary sodium hold Lasix awaiting cardiology further recommendations. TSH is 5.1, she will need a repeat TSH and free T4 as an outpatient in 6 weeks.  Right upper quadrant abdominal pain: Cholelithiasis without cholecystitis on abdominal ultrasound.  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  Hepatic cirrhosis: Aldactone held, LFTs unremarkable.  History of hepatic hemangioma: Confirmed by MRI about the same size.  Essential hypertension: Her blood pressure is soft. Continue to hold her antihypertensive medication outside of her diuretics.  Lower extremity swelling: Lower extremity  Doppler negative for DVT.  Severe morbid obesity: Counseling probably contributing to a lot of her problems.  Chronic kidney disease stage IIIb-IV: And is around 2.2 on admission 2.8 after starting IV diuretics here in the hospital now 3.2 with a BUN about the same bicarb of 26 chloride 99. Hold diuretic therapy will discuss with cardiology.  GERD: Continue PPI.  Anemia of chronic renal disease: Most likely due to renal failure no signs of overt bleeding.  Continue to monitor creatinine intermittently.   DVT prophylaxis: lovenox Family Communication:non Status is: Inpatient  Remains inpatient appropriate because: Acute systolic heart failure with abdominal pain.    Code Status:     Code Status Orders  (From admission, onward)           Start     Ordered   07/23/21 0100  Full code  Continuous        07/23/21 0059           Code Status History     Date Active Date Inactive Code Status Order ID Comments User Context   06/23/2021 2344 06/28/2021 2235 Full Code [id number]  Bethena Roys, MD ED   09/16/2020 0136 09/22/2020 2357 Full Code [id number]  Bernadette Hoit, DO ED   10/21/2018 1305 10/22/2018 1219 Full Code [id number]  Leta Baptist, MD Inpatient         IV Access:   Peripheral IV   Procedures and diagnostic studies:   US Abdomen Complete  Result Date: 07/22/2021 CLINICAL DATA:  Right upper quadrant pain history of cirrhosis and ascites EXAM: ABDOMEN ULTRASOUND COMPLETE COMPARISON:  12/17/2020 MRI FINDINGS: Gallbladder: Echogenic shadowing gallstone measures up to 3.3 cm  by ultrasound. Wall thickness measures 5.8 mm. No pericholecystic fluid. Nonspecific Murphy's sign elicited over the gallbladder. Common bile duct: Diameter: 4.7 mm Liver: Increased echogenicity with surface nodularity compatible with hepatic cirrhosis. Right hepatic dome solid hyperechoic lesion measures 3.6 x 4.1 cm, previously demonstrated to be hemangioma by MRI. Portal vein is  patent on color Doppler imaging with normal direction of blood flow towards the liver. IVC: No abnormality visualized. Pancreas: Unable to visualize because of body habitus Spleen: Size and appearance within normal limits. Right Kidney: Length: 8.9 cm. Echogenicity within normal limits. No mass or hydronephrosis visualized. Left Kidney: Length: 9.6 cm. Echogenicity within normal limits. No mass or hydronephrosis. Possible small 7 mm shadowing calculus in the midpole region. Abdominal aorta: No aneurysm visualized. Other findings: Moderate volume of abdominopelvic ascites. IMPRESSION: Cholelithiasis with some degree of mild gallbladder wall thickening. No pericholecystic fluid. Nonspecific sonographic Murphy's sign elicited. Correlate clinically for cholecystitis. Hepatic cirrhosis and moderate volume of abdominopelvic ascites. 4.1 cm hyperechoic right hepatic dome lesion previously demonstrated be a hemangioma by MRI. Query subcentimeter nonobstructing left nephrolithiasis. Electronically Signed   By: Jerilynn Mages.  Shick M.D.   On: 07/22/2021 11:44   US Venous Img Lower Unilateral Left (DVT)  Result Date: 07/23/2021 CLINICAL DATA:  Left leg swelling EXAM: LEFT LOWER EXTREMITY VENOUS DOPPLER ULTRASOUND TECHNIQUE: Gray-scale sonography with compression, as well as color and duplex ultrasound, were performed to evaluate the deep venous system(s) from the level of the common femoral vein through the popliteal and proximal calf veins. COMPARISON:  None. FINDINGS: VENOUS Normal compressibility of the common femoral, superficial femoral, and popliteal veins, as well as the visualized calf veins. Visualized portions of profunda femoral vein and great saphenous vein unremarkable. No filling defects to suggest DVT on grayscale or color Doppler imaging. Doppler waveforms show normal direction of venous flow, normal respiratory plasticity and response to augmentation. Limited views of the contralateral common femoral vein are  unremarkable. OTHER There is a heterogeneous soft tissue mass in the anterior lower leg measuring 7.9 x 5.4 x 3.5 cm. Mild internal vascularity. Limitations: Limited assessment of the calf veins due to pitting edema and body habitus. IMPRESSION: 1. Negative for DVT in the left lower extremity. Exam is somewhat limited particularly in the calf veins due to edema and body habitus. 2. Indeterminate heterogeneous soft tissue mass in the anterior lower leg measuring up to 7.9 cm. Tissue sampling may be considered. Per patient report mass has been enlarging. Electronically Signed   By: Audie Pinto M.D.   On: 07/23/2021 09:34   DG Chest Portable 1 View  Result Date: 07/22/2021 CLINICAL DATA:  Dyspnea EXAM: PORTABLE CHEST 1 VIEW COMPARISON:  06/23/2021 FINDINGS: Lungs volumes are small, but are symmetric and are clear. No pneumothorax or pleural effusion. Cardiac size within normal limits. Vascular crowding at the hila secondary to poor pulmonary insufflation. Osseous structures are age-appropriate. No acute bone abnormality. IMPRESSION: No active disease. Electronically Signed   By: Fidela Salisbury M.D.   On: 07/22/2021 22:01   ECHOCARDIOGRAM COMPLETE  Result Date: 07/23/2021    ECHOCARDIOGRAM REPORT   Patient Name:   MASAYE GATCHALIAN Date of Exam: 07/23/2021 Medical Rec #:  [id number]      Height:       63.0 in Accession #:    [id number]     Weight:       316.0 lb Date of Birth:  07-22-1976      BSA:  2.349 m Patient Age:    63 years       BP:           102/70 mmHg Patient Gender: F              HR:           91 bpm. Exam Location:  Inpatient Procedure: 2D Echo, Color Doppler and Cardiac Doppler Indications:    H85.27 Acute diastolic (congestive) heart failure  History:        Patient has prior history of Echocardiogram examinations, most                 recent 03/24/2021. CHF; Risk Factors:Hypertension and Sleep                 Apnea.  Sonographer:    Raquel Sarna Senior RDCS Referring Phys: [id number] OLADAPO  ADEFESO IMPRESSIONS  1. Left ventricular ejection fraction, by estimation, is 60 to 65%. The left ventricle has normal function. The left ventricle has no regional wall motion abnormalities. Left ventricular diastolic parameters were normal.  2. Right ventricular systolic function is normal. The right ventricular size is normal.  3. Left atrial size was mildly dilated.  4. The mitral valve is normal in structure. No evidence of mitral valve regurgitation. No evidence of mitral stenosis.  5. The aortic valve is tricuspid. Aortic valve regurgitation is not visualized. Aortic valve sclerosis is present, with no evidence of aortic valve stenosis. FINDINGS  Left Ventricle: Left ventricular ejection fraction, by estimation, is 60 to 65%. The left ventricle has normal function. The left ventricle has no regional wall motion abnormalities. The left ventricular internal cavity size was normal in size. There is  no left ventricular hypertrophy. Left ventricular diastolic parameters were normal. Right Ventricle: The right ventricular size is normal. Right ventricular systolic function is normal. Left Atrium: Left atrial size was mildly dilated. Right Atrium: Right atrial size was normal in size. Pericardium: There is no evidence of pericardial effusion. Mitral Valve: The mitral valve is normal in structure. No evidence of mitral valve regurgitation. No evidence of mitral valve stenosis. Tricuspid Valve: The tricuspid valve is normal in structure. Tricuspid valve regurgitation is mild . No evidence of tricuspid stenosis. Aortic Valve: The aortic valve is tricuspid. Aortic valve regurgitation is not visualized. Aortic valve sclerosis is present, with no evidence of aortic valve stenosis. Pulmonic Valve: The pulmonic valve was grossly normal. Pulmonic valve regurgitation is trivial. No evidence of pulmonic stenosis. Aorta: The aortic root is normal in size and structure. Venous: The inferior vena cava was not well visualized.  IAS/Shunts: The interatrial septum was not well visualized.  LEFT VENTRICLE PLAX 2D LVIDd:         4.10 cm   Diastology LVIDs:         2.80 cm   LV e' medial:    12.20 cm/s LV PW:         0.70 cm   LV E/e' medial:  7.1 LV IVS:        0.60 cm   LV e' lateral:   12.90 cm/s LVOT diam:     1.80 cm   LV E/e' lateral: 6.7 LV SV:         46 LV SV Index:   20 LVOT Area:     2.54 cm  RIGHT VENTRICLE RV S prime:     10.20 cm/s TAPSE (M-mode): 1.6 cm LEFT ATRIUM  Index        RIGHT ATRIUM           Index LA diam:        4.90 cm 2.09 cm/m   RA Area:     22.30 cm LA Vol (A2C):   54.2 ml 23.08 ml/m  RA Volume:   70.70 ml  30.10 ml/m LA Vol (A4C):   75.9 ml 32.32 ml/m LA Biplane Vol: 65.0 ml 27.68 ml/m  AORTIC VALVE LVOT Vmax:   103.00 cm/s LVOT Vmean:  71.600 cm/s LVOT VTI:    0.181 m  AORTA Ao Root diam: 2.80 cm Ao Asc diam:  3.00 cm MITRAL VALVE               TRICUSPID VALVE MV Area (PHT): 3.30 cm    TR Peak grad:   20.4 mmHg MV Decel Time: 230 msec    TR Vmax:        226.00 cm/s MV E velocity: 86.50 cm/s MV A velocity: 55.70 cm/s  SHUNTS MV E/A ratio:  1.55        Systemic VTI:  0.18 m                            Systemic Diam: 1.80 cm Kirk Ruths MD Electronically signed by Kirk Ruths MD Signature Date/Time: 07/23/2021/2:47:50 PM    Final      Medical Consultants:   None.   Subjective:    MILINDA SWEENEY continues to complain of lower extremity swelling and right upper quadrant abdominal pain  Objective:    Vitals:   07/23/21 1300 07/23/21 1609 07/23/21 2001 07/24/21 0452  BP: 107/80 111/86 92/61 99/78   Pulse: 89 (!) 101 92 83  Resp: 15 16 18 18   Temp:  (!) 97.5 F (36.4 C) 97.6 F (36.4 C) 97.6 F (36.4 C)  TempSrc:  Oral Oral Oral  SpO2: 100% 100% 100%   Weight:  (!) 140.5 kg  (!) 140.5 kg  Height:  5\' 3"  (1.6 m)     SpO2: 100 % O2 Flow Rate (L/min): 0 L/min FiO2 (%): 21 %   Intake/Output Summary (Last 24 hours) at 07/24/2021 0709 Last data filed at 07/24/2021  0459 Gross per 24 hour  Intake 480 ml  Output 1175 ml  Net -695 ml   Filed Weights   07/22/21 1745 07/23/21 1609 07/24/21 0452  Weight: (!) 143.3 kg (!) 140.5 kg (!) 140.5 kg    Exam: General exam: In no acute distress.  She has edema up to her rib cage Respiratory system: Good air movement and clear to auscultation. Cardiovascular system: S1 & S2 heard, RRR. No JVD. Gastrointestinal system: Abdomen is nondistended, soft and nontender.  Extremities: 3+ edema, she has edema up to her rib cage Skin: No rashes, lesions or ulcers Psychiatry: Judgement and insight appear normal. Mood & affect appropriate.    Data Reviewed:    Labs: Basic Metabolic Panel: Recent Labs  Lab 07/22/21 1148 07/22/21 1153 07/22/21 1755 07/23/21 0551 07/24/21 0210  NA 136 135 135 136 135  K 4.6 4.6 4.9 5.0 4.3  CL 97* 96* 98 99 99  CO2 27 26 25 26 26   GLUCOSE 92 91 85 93 97  BUN 44* 44* 43* 46* 44*  CREATININE 2.76* 2.76* 2.76* 2.84* 3.21*  CALCIUM 9.6 9.5 9.3 9.2 9.3  MG  --   --   --  2.0  --   PHOS 4.1  --   --  4.4  --    GFR Estimated Creatinine Clearance: 30 mL/min (A) (by C-G formula based on SCr of 3.21 mg/dL (H)). Liver Function Tests: Recent Labs  Lab 07/22/21 1144 07/22/21 1148 07/22/21 1153 07/22/21 1755 07/23/21 0551  AST 38  --  38 40 34  ALT 18  --  18 18 16   ALKPHOS 84  --  89 88 79  BILITOT 2.7*  --  2.7* 2.4* 2.5*  PROT 7.8  --  7.9 7.7 7.2  ALBUMIN 4.1 4.0 4.1 4.0 3.7   Recent Labs  Lab 07/22/21 1144 07/22/21 1755  LIPASE 41 46   No results for input(s): AMMONIA in the last 168 hours. Coagulation profile Recent Labs  Lab 07/22/21 1755  INR 1.2   COVID-19 Labs  Recent Labs    07/22/21 1147  FERRITIN 198    Lab Results  Component Value Date   SARSCOV2NAA NEGATIVE 07/22/2021   [place] NEGATIVE 06/23/2021   [place] NEGATIVE 09/15/2020   [place] NEGATIVE 08/02/2020    CBC: Recent Labs  Lab 07/22/21 1144 07/22/21 1153  07/22/21 1755 07/23/21 0551  WBC 6.0 6.2 7.0 5.8  NEUTROABS 4.4 4.5 5.2  --   HGB 11.9* 11.8* 11.9* 11.2*  HCT 37.0 36.5 37.1 35.1*  MCV 97.6 98.6 100.0 100.0  PLT 265 269 274 245   Cardiac Enzymes: No results for input(s): CKTOTAL, CKMB, CKMBINDEX, TROPONINI in the last 168 hours. BNP (last 3 results) No results for input(s): PROBNP in the last 8760 hours. CBG: No results for input(s): GLUCAP in the last 168 hours. D-Dimer: No results for input(s): DDIMER in the last 72 hours. Hgb A1c: No results for input(s): HGBA1C in the last 72 hours. Lipid Profile: No results for input(s): CHOL, HDL, LDLCALC, TRIG, CHOLHDL, LDLDIRECT in the last 72 hours. Thyroid function studies: Recent Labs    07/23/21 1721  TSH 5.189*   Anemia work up: Recent Labs    07/22/21 1144 07/22/21 1147  FERRITIN  --  198  TIBC 343 348  IRON 117 118   Sepsis Labs: Recent Labs  Lab 07/22/21 1144 07/22/21 1153 07/22/21 1755 07/23/21 0551  WBC 6.0 6.2 7.0 5.8   Microbiology Recent Results (from the past 240 hour(s))  Resp Panel by RT-PCR (Flu A&B, Covid) Nasopharyngeal Swab     Status: None   Collection Time: 07/22/21  9:54 PM   Specimen: Nasopharyngeal Swab; Nasopharyngeal(NP) swabs in vial transport medium  Result Value Ref Range Status   SARS Coronavirus 2 by RT PCR NEGATIVE NEGATIVE Final    Comment: (NOTE) SARS-CoV-2 target nucleic acids are NOT DETECTED.  The SARS-CoV-2 RNA is generally detectable in upper respiratory specimens during the acute phase of infection. The lowest concentration of SARS-CoV-2 viral copies this assay can detect is 138 copies/mL. A negative result does not preclude SARS-Cov-2 infection and should not be used as the sole basis for treatment or other patient management decisions. A negative result may occur with  improper specimen collection/handling, submission of specimen other than nasopharyngeal swab, presence of viral mutation(s) within the areas targeted  by this assay, and inadequate number of viral copies(<138 copies/mL). A negative result must be combined with clinical observations, patient history, and epidemiological information. The expected result is Negative.  Fact Sheet for Patients:  EntrepreneurPulse.com.au  Fact Sheet for Healthcare Providers:  IncredibleEmployment.be  This test is no t yet approved or cleared by the [place] FDA and  has been authorized for detection and/or diagnosis of SARS-CoV-2 by FDA  under an Emergency Use Authorization (EUA). This EUA will remain  in effect (meaning this test can be used) for the duration of the COVID-19 declaration under Section 564(b)(1) of the Act, 21 U.S.C.section 360bbb-3(b)(1), unless the authorization is terminated  or revoked sooner.       Influenza A by PCR NEGATIVE NEGATIVE Final   Influenza B by PCR NEGATIVE NEGATIVE Final    Comment: (NOTE) The Xpert Xpress SARS-CoV-2/FLU/RSV plus assay is intended as an aid in the diagnosis of influenza from Nasopharyngeal swab specimens and should not be used as a sole basis for treatment. Nasal washings and aspirates are unacceptable for Xpert Xpress SARS-CoV-2/FLU/RSV testing.  Fact Sheet for Patients: EntrepreneurPulse.com.au  Fact Sheet for Healthcare Providers: IncredibleEmployment.be  This test is not yet approved or cleared by the [place] FDA and has been authorized for detection and/or diagnosis of SARS-CoV-2 by FDA under an Emergency Use Authorization (EUA). This EUA will remain in effect (meaning this test can be used) for the duration of the COVID-19 declaration under Section 564(b)(1) of the Act, 21 U.S.C. section 360bbb-3(b)(1), unless the authorization is terminated or revoked.  Performed at [facility], [address] [place], [place] [address]      Medications:    cholecalciferol  1,000 Units Oral Daily   enoxaparin  (LOVENOX) injection  40 mg Subcutaneous Daily   furosemide  80 mg Intravenous Q12H   pantoprazole  40 mg Oral Daily   Continuous Infusions:    LOS: 2 days   Charlynne Cousins  [facility]  07/24/2021, 7:09 AM

## 2021-07-24 NOTE — TOC Progression Note (Signed)
Transition of Care [facility]) - Progression Note    Patient Details  Name: Lauren Osborn MRN: [id number] Date of Birth: 1975-11-23  Transition of Care [facility]) CM/SW Contact  Gerldine Suleiman, Nonda Lou, [place] Phone Number: 07/24/2021, 9:39 AM  Clinical Narrative:  [facility] team for discharge planning. Pt just had goals of care discussion with Palliative. The HCPOA form left at bedside. Will continue to monitor for discharge planning needs.    Expected Discharge Plan: Home/Self Care Barriers to Discharge: Continued Medical Work up  Expected Discharge Plan and Services Expected Discharge Plan: Home/Self Care In-house Referral: Clinical Social Work Discharge Planning Services: CM Consult   Living arrangements for the past 2 months: Single Family Home                                       Social Determinants of Health (SDOH) Interventions    Readmission Risk Interventions Readmission Risk Prevention Plan 07/23/2021 06/24/2021  Transportation Screening Complete Complete  Home Care Screening Complete Complete  Medication Review (RN CM) Complete Complete  Some recent data might be hidden

## 2021-07-24 NOTE — Consult Note (Signed)
Palliative Medicine Inpatient Consult Note  Reason for consult:  goals of care   HPI:  Per intake H&P 07/22/21 by Dr Langston Masker --> "Lauren Osborn is a 45 y.o. Lauren with medical history significant for hypertension, CHF, OSA on CPAP, gout and obesity who presents to the emergency department due to worsening shortness of breath, increased leg swelling, abdominal distention and weight gain that has been ongoing for about 2 weeks.  Patient complained of 20 to 30 pound weight gain despite being compliant with home meds (torsemide 80 mg a.m/60 mg p.m and metolazone 2.5 mg 3 times weekly and spironolactone).   Patient also complained of 1 week of right upper quadrant abdominal pain which was described as sharp and worsens with eating. She followed up with her cardiologist on 11/10 and there was consideration for referring patient to advanced heart failure for any possible additional recommendation. She followed up with her gastroenterologist today (11/18) who recommended for her to go to the ED for further evaluation and management.   ED Course:  In the emergency department, BP was soft at 91/62, but other vital signs were within normal range.  Work-up in the ED showed normocytic anemia, BUN to creatinine 43/2.76, BNP 510, vitamin D14.41.  Urinalysis was negative, lipase 46.  Influenza A, B, SARS coronavirus 2 was negative. Chest x-ray showed no active disease Complete abdominal ultrasound showed cholelithiasis with some degree of mild gallbladder wall thickening.  Hepatic cirrhosis and moderate volume of abdominal pelvic ascites. 4.1 cm hyperechoic right hepatic dome lesion previously demonstrated to be a hemangioma by MRI. IV Lasix 60 mg and IV Dilaudid were given.  Cardiology at [facility] was consulted and was aware that patient will be admitted to [facility] since no cardiology at [facility] on weekends."   Current concerns; Anasarca of unclear etiology, right upper quadrant abdominal pain, Hepatic cirrhosis, h/o hepatic  hemangioma, essential hypertension, lower extremity swelling, morbid obesity, chronic kidney disease stage IIIb-IV, GERD, anemia of chronic renal disease.   Clinical Assessment/Goals of Care: I have reviewed medical records including EPIC notes, labs and imaging, received report from bedside RN [place], and assessed the patient.    I met with Lauren Osborn to further discuss diagnosis prognosis, GOC, EOL wishes, disposition and options.   I introduced Palliative Medicine as specialized medical care for people living with serious illness. It focuses on providing relief from the symptoms and stress of a serious illness. The goal is to improve quality of life for both the patient and the family.  Sunday lives in [place]. Her daughter, Lauren Osborn, is 83 and lives with her. Her daughter's fiance Lauren Osborn has lived with them since September. Lauren Osborn is still in [employer]. 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 Her daughter works for the same company. Lauren Osborn has no significant other currently.  Her sisters and mother live nearby.She is of [employer] and has support from her pastor. At home there are two cats that she misses here in the hospital. She has been able to maintain her job until this hospitalization. Their home is heated with wood but [employer] this. We discuss changes that have been made related to her illnesses. She states she does no added salt. She does not like Salt substitute. We discussed processed foods and increased salt in them and the need to rinse any canned vegetables prior to cooking.    Lauren Osborn has had help with cooking and grocery shopping. She can ride in  a cart to choose items. Her mother is helping with laundry currently.   A detailed discussion was had today regarding advanced directives.  Concepts specific to code status, artifical feeding and hydration, continued IV antibiotics and rehospitalization was had.  The difference  between a aggressive medical intervention path  and a palliative comfort care path for this patient at this time was had. Values and goals of care important to patient and family were attempted to be elicited.  Lauren Osborn desires continued full scope of treatment. We completed a MOST form today. Full resuscitation, antibiotics if needed, IV fluids if indicated, and NO feeding tube. MOST was scanned to media and will be faxed to medical records. Original is on physical chart on unit. MPOA paperwork ws discussed and a copy left at bedside for patient to read, discuss with family and let us know when she needs a notary for signatures. She states her daughter Lauren Osborn would make decisions if she cannot. If her daughter cannot, her mother is the alternate.  Discussed the importance of continued conversation with family and their  medical providers regarding overall plan of care and treatment options, ensuring decisions are within the context of the patients values and GOCs.  Decision Maker: Patient, In the event that she can not make decision her daughter, Lauren Osborn [phone] can make decisions for her. In the absence of her daughter, she states her mother Lauren Osborn [phone].  MOST form completed. MPOA paperwork left with patient.  SUMMARY OF RECOMMENDATIONS    Code Status/Advance Care Planning: FULL CODE  MOST form completed. Full scope of treatment, no desire for feeding tube indicated.  Symptom Management:  Pain, abdominal - hydromorphone prn GERD- pantoprazole Breathlessness- O2 prn, CPAP at night Nausea- she has had one episode of nausea that she contributes to delay in pain treatment. Avoid ondansetron and phenegran with potential for OT prolongation. Consider scopolamine if nausea is persistent as it's profile does not impact QT.   Palliative Prophylaxis:  Pruritis- Sarna lotion prn  Psycho-social/Spiritual:  Desire for further Chaplaincy support: no. Her pastor is visiting  today.   Discharge Planning: Home, daughter and her fiance live in the home. Patient's mother and two sisters live nearby   [place] with BMI 07/24/2021 07/24/2021 07/23/2021  Height - - -  Weight - 309 lbs 11 oz -  BMI - 27.78 -  Systolic 242 99 92  Diastolic 84 78 61  Pulse 92 83 92    PPS: 70%   This conversation/these recommendations were shared with patient primary care team, Dr. Aileen Fass.  Thank you for the opportunity to participate in the care of this patient and family.   Time In: 07:15 Time Out:08:25 Total Time: 70 minutes Greater than 50%  of this time was spent counseling and coordinating care related to the above assessment and plan.  Lindell Spar, NP [facility] Palliative Medicine Team Team Cell Phone: [phone] Please utilize secure chat with additional questions, if there is no response within 30 minutes please call the above phone number  Palliative Medicine Team providers are available by phone from 7am to 7pm daily and can be reached through the team cell phone.  Should this patient require assistance outside of these hours, please call the patient's attending physician.

## 2021-07-24 NOTE — Progress Notes (Signed)
Patient got up oob to chair as she has several times today. While she does get up, she has been short of breath on minimal exertion but after a few minutes, she recovers. I have seen her do this most all day. However, this evening she got back up to the chair and her lips were blue, I had her pulse oximeter on and her sats had a good pleth and were 92-93% on RA. I immediately applied 2L , sats up to 100% and color came back to lips about 1-2 minutes after I applied the nasal canula. I sent Dr. Olevia Bowens a secure chat to make him aware.

## 2021-07-24 NOTE — Consult Note (Addendum)
Renal Service Consult Note [facility]  Lauren Osborn 07/24/2021 Lauren Blazing, MD Requesting Physician: Dr. Aileen Fass  Reason for Consult: Renal failure HPI: The patient is a 45 y.o. year-old w/ hx of HTN, gout, obesity, anemia, OSA, CKD 3b who presented on 11/18 to ED w/ c/o worsening edema/ anasarca. SOB as well getting worse over last 2 wks.  Pt was admitted and started on IV lasix bid. ECHO showed normal LVEF and no diast changes. US showed moderate ascites. LFT's were wnl. BP lowering meds were held due to hypotension. Seen by cardiology 11/19 w/ dx of HFpEF. IV lasix increased to 80 mg IV bid. Creat bumped up to 3.21 (2.7 on admit 11/18) so lasix was stopped. Asked to see for renal failure.      Pt seen in room.  She weighed 270 lbs at her best 2 yrs ago, now 309. Peak was 386 lbs in Jan 2022.  Pt sees Dr Theador Hawthorne for CKD in [place]. She lives in [place] w/ her daughter. She is single. No etoh/ tob.   States she has hx of cirrhosis (this is new) and CHF for a few years, they told her heart was "weak".  She is "really swollen" now w/ fluid collecting from her feet up to her waist and mid to upper back.     ROS - denies CP, no joint pain, no HA, no blurry vision, no rash, no diarrhea, no nausea/ vomiting   Past Medical History  Past Medical History:  Diagnosis Date   Abnormal bilirubin test    Cholelithiasis    Chronic kidney disease, stage 3b (HCC)    Diastolic congestive heart failure (HCC)    Esophagitis    Essential hypertension    Fibroids    Gastritis    GERD (gastroesophageal reflux disease)    Gout    Liver hemangioma    Morbid obesity ([place])    Normocytic anemia    OSA (obstructive sleep apnea)    Thyroid nodule    Tubular adenoma    Past Surgical History  Past Surgical History:  Procedure Laterality Date   BIOPSY  08/03/2020   Procedure: BIOPSY;  Surgeon: Eloise Harman, DO;  Location: [facility];  Service: Endoscopy;;   duodenal gastric   CESAREAN SECTION     COLONOSCOPY WITH PROPOFOL N/A 08/03/2020    Surgeon: Hurshel Keys K, DO; 5 mm tubular adenoma in the sigmoid colon, nonbleeding internal hemorrhoids.  Recommended repeat in 5 years.   ESOPHAGOGASTRODUODENOSCOPY (EGD) WITH PROPOFOL N/A 08/03/2020   Surgeon: Eloise Harman, DO; LA grade C esophagitis without bleeding, gastritis with erythema and shallow ulcerations in gastric antrum biopsied (negative for H. pylori), normal examined duodenum s/p biopsy (benign).   HYSTERECTOMY ABDOMINAL WITH SALPINGECTOMY  2008   POLYPECTOMY  08/03/2020   Procedure: POLYPECTOMY;  Surgeon: Eloise Harman, DO;  Location: [facility];  Service: Endoscopy;;  colon   THYROIDECTOMY Right 10/21/2018   Procedure: RIGHT HEMITHYROIDECTOMY;  Surgeon: Leta Baptist, MD;  Location: [place];  Service: ENT;  Laterality: Right;   Family History  Family History  Problem Relation Age of Onset   Breast cancer Mother    Other Paternal Grandfather        house fire   Other Maternal Grandmother        house fire   Congestive Heart Failure Maternal Grandfather    Colon cancer Father 90   Diverticulitis Brother    Diverticulitis Sister  Colon polyps Sister 97   Diabetes Daughter        borderline   Social History  reports that she quit smoking about 5 years ago. Her smoking use included cigarettes. She has a 10.00 pack-year smoking history. She has never used smokeless tobacco. She reports that she does not currently use alcohol. She reports that she does not use drugs. Allergies  Allergies  Allergen Reactions   Oxycodone-Acetaminophen Hives and Other (See Comments)   Home medications Prior to Admission medications   Medication Sig Start Date End Date Taking? Authorizing Provider  acetaminophen (TYLENOL) 500 MG tablet Take 1,000 mg by mouth every 6 (six) hours as needed for moderate pain or headache.   Yes [provider]  magnesium oxide (MAG-OX)  400 MG tablet TAKE 1 TABLET BY MOUTH ONCE DAILY. 01/17/21  Yes Satira Sark, MD  metolazone (ZAROXOLYN) 2.5 MG tablet Take 1 tablet (2.5 mg total) by mouth See admin instructions. 2.5 mg every M W F 06/28/21  Yes Barton Dubois, MD  omeprazole (PRILOSEC) 20 MG capsule Take 1 capsule (20 mg total) by mouth daily before breakfast. 07/22/21 01/18/22 Yes Jodi Mourning, Tivis Ringer, PA-C  potassium chloride SA (KLOR-CON) 20 MEQ tablet Take 3 tablets (60 mEq total) by mouth 3 (three) times daily. Take 2 additional Tablets on the Days That you take Metolazone 07/15/21  Yes Strader, [place], PA-C  spironolactone (ALDACTONE) 25 MG tablet Take 1 tablet (25 mg total) by mouth daily. 06/28/21  Yes Barton Dubois, MD  TART CHERRY PO Take by mouth daily.   Yes [provider]  torsemide (DEMADEX) 20 MG tablet Take 4 tablets in am and 3 tablets in the evening. 06/28/21  Yes Barton Dubois, MD     Vitals:   07/23/21 2001 07/24/21 0452 07/24/21 0800 07/24/21 0805  BP: 92/61 99/78 112/84   Pulse: 92 83 92   Resp: _0 Temp: 97.6 F (36.4 C) 97.6 F (36.4 C) (!) 97.2 F (36.2 C)   TempSrc: Oral Oral Oral   SpO2: 100%  90% 96%  Weight:  (!) 140.5 kg    Height:       Exam Gen alert, no distress No rash, cyanosis or gangrene Sclera anicteric, throat clear  No jvd or bruits Chest clear bilat to bases, no rales/ wheezing RRR no MRG Abd soft ntnd no mass, has marked 2-3+ ascites GU defer MS no joint effusions or deformity Ext shows 2-3+ diffuse pitting edema from the feet to hips to the upper back Neuro is alert, Ox 3 , nf         Home meds include - metolazone 2.5 mwf, prilosec, Klor-con 60 meq tid, aldactone 25 qd, demadex 80 am and 60 pm      Date   Creat  eGFR    2008- 2020  0.47- 0.64    2021   0.99- 1.09 Sep 2020  1.44- 1.80    March 2022  2.00- 2.03    April    1.72    June   2.19       July    2.08- 2.14     Oct 20- 25, 2022 1.88- 2.28 26- 33 ml/min, CKD IIIb       Nov 10  1.99  31         Nov 18  2.75  21      Nov 19  2.84  20      Jul 24, 2021 3.21  17    CXR 11/18 - IMPRESSION: No active disease.    ECHO 11/19 - normal EF 60-65%, no LVH, no diastolic dysfunction     Abd [place] 11/18 > Liver: Increased echogenicity with surface nodularity compatible with hepatic cirrhosis. Right hepatic dome solid hyperechoic lesion measures 3.6 x 4.1 cm, previously demonstrated to be hemangioma by MRI. Portal vein is patent on color Doppler imaging with normal direction of blood flow towards the liver      Abd [place] 11/18 - Kidneys: R 8.9 cm/ L 9.6 cm, no hydro, echo w/in normal limits       UA - clear , negative         Una = < 10   UCr = 32        BP's here > 97- 112/ 61- 86, HR 90, RR 18  temp afeb   RA 96-100%        UOP 950 cc yesterday and 375 cc UOP today           Wt's = 143 kg on admit, 140kg yest and today     Assessment/ Plan: AKI on CKD 3b - b/l creatinine from Oct 2022 is 1.9- 2.3, eGFR 26-33 ml/min. Pt admitted w/ sig anasarca on 11/18, creat 2.7.  She received 1 dose each of 59m, 432mthen 80 mg IV lasix over last 3 days.  UOP 1 liter yesterday, 375 cc today. Creat up to 2.8 yest and to 3.21 today. BP's are low-normal. UNa low, UA negative and renal US[place]ore[place]ows normal appearing kidneys w/o obstruction. Exam shows ascites and diffuse edema throughout the body, with clear lungs and normal CXR.  With normal UA, renal US[place]ore[place]oft BP's this is likely hemodynamic renal failure due to CHF or cirrhosis. Cirrhosis dose not appear decompensated at this time. Is at risk for R HF w/ her obesity.  Pt is not in distress, would hold further IV diuretics for now.  Recommend CHF team be consulted. Consider paracentesis by IR. Will follow.       RoKelly SplinterMD 07/24/2021, 3:43 PM  Recent Labs  Lab 07/22/21 1755 07/23/21 0551  WBC 7.0 5.8  HGB 11.9* 11.2*   Recent Labs  Lab 07/22/21 1148 07/22/21 1153 07/23/21 0551 07/24/21 0210  K 4.6   < > 5.0 4.3  BUN 44*   < >  46* 44*  CREATININE 2.76*   < > 2.84* 3.21*  CALCIUM 9.6   < > 9.2 9.3  PHOS 4.1  --  4.4  --    < > = values in this interval not displayed.

## 2021-07-25 ENCOUNTER — Inpatient Hospital Stay (HOSPITAL_COMMUNITY): Payer: Managed Care, Other (non HMO)

## 2021-07-25 ENCOUNTER — Ambulatory Visit (HOSPITAL_COMMUNITY): Payer: Managed Care, Other (non HMO)

## 2021-07-25 DIAGNOSIS — I5033 Acute on chronic diastolic (congestive) heart failure: Secondary | ICD-10-CM | POA: Diagnosis not present

## 2021-07-25 DIAGNOSIS — R7989 Other specified abnormal findings of blood chemistry: Secondary | ICD-10-CM | POA: Diagnosis not present

## 2021-07-25 DIAGNOSIS — R601 Generalized edema: Secondary | ICD-10-CM | POA: Diagnosis not present

## 2021-07-25 DIAGNOSIS — N184 Chronic kidney disease, stage 4 (severe): Secondary | ICD-10-CM | POA: Diagnosis not present

## 2021-07-25 LAB — BASIC METABOLIC PANEL
Anion gap: 11 (ref 5–15)
BUN: 49 mg/dL — ABNORMAL HIGH (ref 6–20)
CO2: 26 mmol/L (ref 22–32)
Calcium: 9.2 mg/dL (ref 8.9–10.3)
Chloride: 97 mmol/L — ABNORMAL LOW (ref 98–111)
Creatinine, Ser: 3.47 mg/dL — ABNORMAL HIGH (ref 0.44–1.00)
GFR, Estimated: 16 mL/min — ABNORMAL LOW (ref >60)
Glucose, Bld: 97 mg/dL (ref 70–99)
Potassium: 4.6 mmol/L (ref 3.5–5.1)
Sodium: 134 mmol/L — ABNORMAL LOW (ref 135–145)

## 2021-07-25 IMAGING — DX DG CHEST 1V PORT
1 series · 1 of 1 positions shown · non-contrast
Comparison: [DATE]

CLINICAL DATA: Dyspnea

EXAM:
PORTABLE CHEST 1 VIEW

[chest]
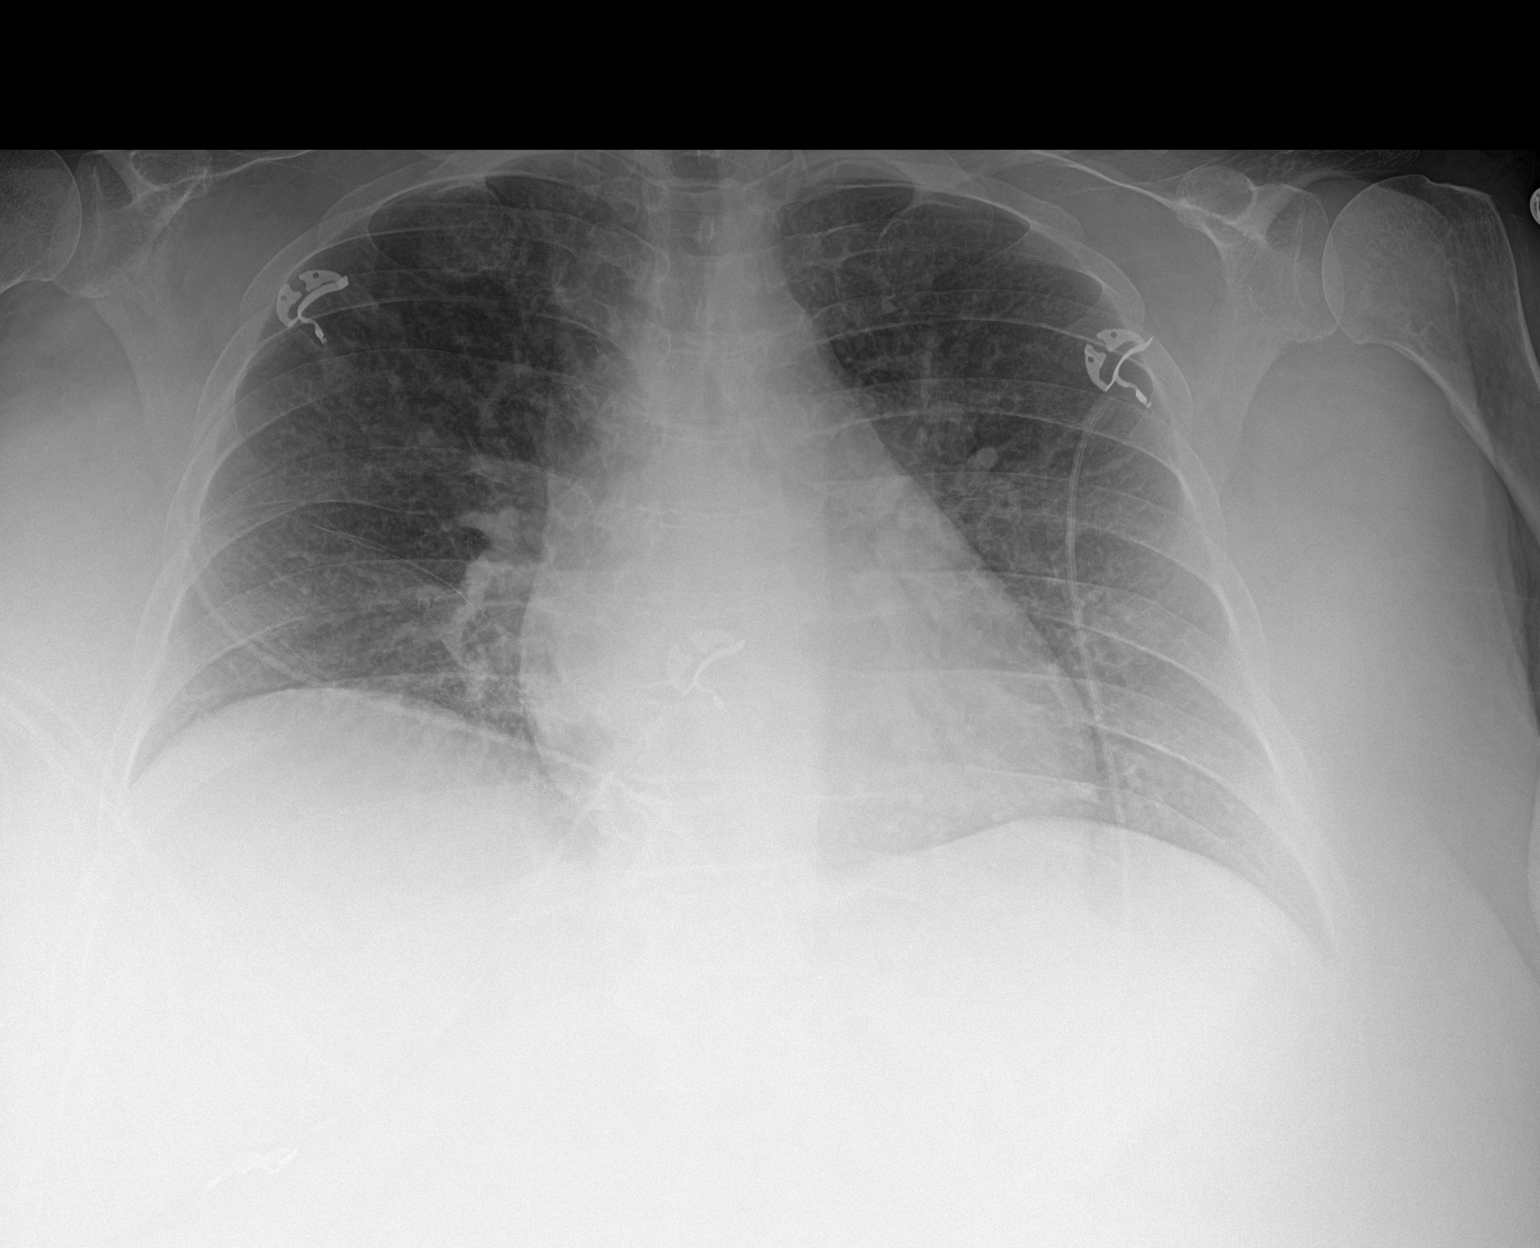

[1 of 1 positions shown; findings below may reference images not displayed]

FINDINGS: Single frontal view of the chest demonstrates an unremarkable
cardiac silhouette. No airspace disease, effusion, or pneumothorax.
No acute bony abnormalities.
IMPRESSION: 1. Stable exam, no acute process.

## 2021-07-25 IMAGING — NM NM PULMONARY PERF PARTICULATE
8 series · 8 of 8 positions shown · non-contrast
Comparison: [DATE]

CLINICAL DATA: Short of breath, history of tobacco abuse

EXAM:
NUCLEAR MEDICINE PERFUSION LUNG SCAN
TECHNIQUE: Perfusion images were obtained in multiple projections after
intravenous injection of radiopharmaceutical.
Ventilation scans intentionally deferred if perfusion scan and chest
x-ray adequate for interpretation during COVID 19 epidemic.
RADIOPHARMACEUTICALS:  4.4 mCi [O8] MAA IV

[Series 1: ant/post perf · 4.14mm/px · 1 of 1 slices shown (1 of 2)]
[im 1/1]
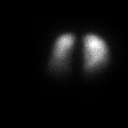

[Series 1: ant/post perf · 4.14mm/px · 1 of 1 slices shown (2 of 2)]
[im 1/1]
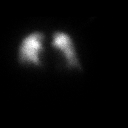

[Series 2: lao/rpo perf · 4.14mm/px · 1 of 1 slices shown (1 of 2)]
[im 1/1]
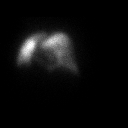

[Series 2: lao/rpo perf · 4.14mm/px · 1 of 1 slices shown (2 of 2)]
[im 1/1]
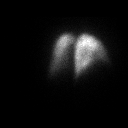

[Series 3: lpo/rao perf · 4.14mm/px · 1 of 1 slices shown (1 of 2)]
[im 1/1]
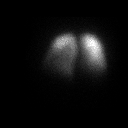

[Series 3: lpo/rao perf · 4.14mm/px · 1 of 1 slices shown (2 of 2)]
[im 1/1]
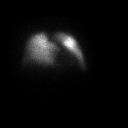

[Series 4: lt lat/rt lat perf · 4.14mm/px · 1 of 1 slices shown (1 of 2)]
[im 1/1]
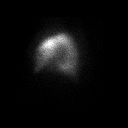

[Series 4: lt lat/rt lat perf · 4.14mm/px · 1 of 1 slices shown (2 of 2)]
[im 1/1]
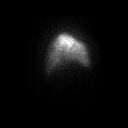

[8 of 8 positions shown; findings below may reference images not displayed]

FINDINGS: Planar images of the chest are obtained during the perfusion
examination. There are no wedge-shaped perfusion defects to suggest
pulmonary embolus. Symmetrical distribution of radiotracer seen
bilaterally.
IMPRESSION: 1. No evidence of pulmonary embolus.

## 2021-07-25 MED ORDER — TECHNETIUM TO 99M ALBUMIN AGGREGATED
4.4000 | Freq: Once | INTRAVENOUS | Status: AC | PRN
  Administered 2021-07-25: 4.4 via INTRAVENOUS

## 2021-07-25 MED ORDER — ALBUMIN HUMAN 25 % IV SOLN
12.5000 g | Freq: Once | INTRAVENOUS | Status: AC
  Administered 2021-07-25: 12.5 g via INTRAVENOUS
  Filled 2021-07-25: qty 50

## 2021-07-25 NOTE — Progress Notes (Signed)
Patient ID: Lauren Osborn, female   DOB: Jan 13, 1976, 45 y.o.   MRN: [id number] [facility] Progress Note   Assessment/ Plan:   1. Acute kidney Injury on chronic kidney disease stage IIIb (baseline creatinine 1.9-2.3): Admitted to the hospital with what appears to be worsening volume overload likely from exacerbation of diastolic heart failure although question arises regarding cirrhosis with her concomitant ascites (her albumin level weighs against her having significant cirrhosis/hepatic synthetic defect).  Diuretics held yesterday morning after single dose of 80 mg administered with slight worsening of creatinine this morning from 3.2-3.4.  Oliguric overnight but accuracy questionable, will give IV albumin today. 2.  Hyponatremia: Secondary to CHF exacerbation/impaired free water deficit and inappropriate ADH activation-continue fluid restriction and will decide on restarting loop diuretics as renal function improves. 3.  Acute exacerbation of diastolic heart failure: Diuretics on hold secondary to worsening renal insufficiency, continue fluid restriction at 1.5 L a day.  Plans noted by cardiology for VQ scan to evaluate for chronic PE along with right heart catheterization. 4.  Anemia: Hemoglobin and hematocrit slightly lower-unclear if this is dilutional from CHF exacerbation, no indication for ESA.  Subjective:   Expresses some frustration at continued monitoring without procedures.   Objective:   BP 106/84 (BP Location: Right Arm)   Pulse 78   Temp 97.7 F (36.5 C) (Oral)   Resp 20   Ht 5\' 3"  (1.6 m)   Wt (!) 141.5 kg   SpO2 95%   BMI 55.27 kg/m   Intake/Output Summary (Last 24 hours) at 07/25/2021 1029 Last data filed at 07/24/2021 2115 Gross per 24 hour  Intake 240 ml  Output 200 ml  Net 40 ml   Weight change: 0.998 kg  Physical Exam: Gen: Appears comfortable sitting up in recliner, feet propped up CVS: Pulse regular rhythm, normal rate, S1 and S2  normal Resp: Diminished breath sounds over bases, no distinct rales or rhonchi Abd: Soft, obese, nontender Ext: 2+ pitting edema over lower extremities  Imaging: ECHOCARDIOGRAM COMPLETE  Result Date: 07/23/2021    ECHOCARDIOGRAM REPORT   Patient Name:   Lauren Osborn Date of Exam: 07/23/2021 Medical Rec #:  [id number]      Height:       63.0 in Accession #:    [id number]     Weight:       316.0 lb Date of Birth:  May 21, 1976      BSA:          2.349 m Patient Age:    38 years       BP:           102/70 mmHg Patient Gender: F              HR:           91 bpm. Exam Location:  Inpatient Procedure: 2D Echo, Color Doppler and Cardiac Doppler Indications:    V61.60 Acute diastolic (congestive) heart failure  History:        Patient has prior history of Echocardiogram examinations, most                 recent 03/24/2021. CHF; Risk Factors:Hypertension and Sleep                 Apnea.  Sonographer:    Raquel Sarna Senior RDCS Referring Phys: [id number] OLADAPO ADEFESO IMPRESSIONS  1. Left ventricular ejection fraction, by estimation, is 60 to 65%. The left ventricle has normal function. The left ventricle has  no regional wall motion abnormalities. Left ventricular diastolic parameters were normal.  2. Right ventricular systolic function is normal. The right ventricular size is normal.  3. Left atrial size was mildly dilated.  4. The mitral valve is normal in structure. No evidence of mitral valve regurgitation. No evidence of mitral stenosis.  5. The aortic valve is tricuspid. Aortic valve regurgitation is not visualized. Aortic valve sclerosis is present, with no evidence of aortic valve stenosis. FINDINGS  Left Ventricle: Left ventricular ejection fraction, by estimation, is 60 to 65%. The left ventricle has normal function. The left ventricle has no regional wall motion abnormalities. The left ventricular internal cavity size was normal in size. There is  no left ventricular hypertrophy. Left ventricular diastolic  parameters were normal. Right Ventricle: The right ventricular size is normal. Right ventricular systolic function is normal. Left Atrium: Left atrial size was mildly dilated. Right Atrium: Right atrial size was normal in size. Pericardium: There is no evidence of pericardial effusion. Mitral Valve: The mitral valve is normal in structure. No evidence of mitral valve regurgitation. No evidence of mitral valve stenosis. Tricuspid Valve: The tricuspid valve is normal in structure. Tricuspid valve regurgitation is mild . No evidence of tricuspid stenosis. Aortic Valve: The aortic valve is tricuspid. Aortic valve regurgitation is not visualized. Aortic valve sclerosis is present, with no evidence of aortic valve stenosis. Pulmonic Valve: The pulmonic valve was grossly normal. Pulmonic valve regurgitation is trivial. No evidence of pulmonic stenosis. Aorta: The aortic root is normal in size and structure. Venous: The inferior vena cava was not well visualized. IAS/Shunts: The interatrial septum was not well visualized.  LEFT VENTRICLE PLAX 2D LVIDd:         4.10 cm   Diastology LVIDs:         2.80 cm   LV e' medial:    12.20 cm/s LV PW:         0.70 cm   LV E/e' medial:  7.1 LV IVS:        0.60 cm   LV e' lateral:   12.90 cm/s LVOT diam:     1.80 cm   LV E/e' lateral: 6.7 LV SV:         46 LV SV Index:   20 LVOT Area:     2.54 cm  RIGHT VENTRICLE RV S prime:     10.20 cm/s TAPSE (M-mode): 1.6 cm LEFT ATRIUM             Index        RIGHT ATRIUM           Index LA diam:        4.90 cm 2.09 cm/m   RA Area:     22.30 cm LA Vol (A2C):   54.2 ml 23.08 ml/m  RA Volume:   70.70 ml  30.10 ml/m LA Vol (A4C):   75.9 ml 32.32 ml/m LA Biplane Vol: 65.0 ml 27.68 ml/m  AORTIC VALVE LVOT Vmax:   103.00 cm/s LVOT Vmean:  71.600 cm/s LVOT VTI:    0.181 m  AORTA Ao Root diam: 2.80 cm Ao Asc diam:  3.00 cm MITRAL VALVE               TRICUSPID VALVE MV Area (PHT): 3.30 cm    TR Peak grad:   20.4 mmHg MV Decel Time: 230 msec    TR  Vmax:        226.00 cm/s MV E velocity: 86.50 cm/s  MV A velocity: 55.70 cm/s  SHUNTS MV E/A ratio:  1.55        Systemic VTI:  0.18 m                            Systemic Diam: 1.80 cm Kirk Ruths MD Electronically signed by Kirk Ruths MD Signature Date/Time: 07/23/2021/2:47:50 PM    Final     Labs: BMET Recent Labs  Lab 07/22/21 1144 07/22/21 1148 07/22/21 1153 07/22/21 1755 07/23/21 0551 07/24/21 0210 07/25/21 0123  NA 137 136 135 135 136 135 134*  K 4.7 4.6 4.6 4.9 5.0 4.3 4.6  CL 98 97* 96* 98 99 99 97*  CO2 27 27 26 25 26 26 26   GLUCOSE 93 92 91 85 93 97 97  BUN 44* 44* 44* 43* 46* 44* 49*  CREATININE 2.75* 2.76* 2.76* 2.76* 2.84* 3.21* 3.47*  CALCIUM 9.7 9.6 9.5 9.3 9.2 9.3 9.2  PHOS  --  4.1  --   --  4.4  --   --    CBC Recent Labs  Lab 07/22/21 1144 07/22/21 1153 07/22/21 1755 07/23/21 0551  WBC 6.0 6.2 7.0 5.8  NEUTROABS 4.4 4.5 5.2  --   HGB 11.9* 11.8* 11.9* 11.2*  HCT 37.0 36.5 37.1 35.1*  MCV 97.6 98.6 100.0 100.0  PLT 265 269 274 245    Medications:     cholecalciferol  1,000 Units Oral Daily   enoxaparin (LOVENOX) injection  40 mg Subcutaneous Daily   pantoprazole  40 mg Oral Daily   Elmarie Shiley, MD 07/25/2021, 10:29 AM

## 2021-07-25 NOTE — Progress Notes (Signed)
This chaplain responded to the MD spiritual care consult for creating/updating the Pt. Advance Directive.  The Pt. is away from the room for a procedure.    The chaplain phoned the Pt. daughter-Paige for an AD update. The chaplain understands Arby Barrette will review the AD with the Pt. and phone the [place] with any questions that may arise from their discussion.  The chaplain will F/U with the Pt. on Tuesday.  Chaplain Sallyanne Kuster [phone]

## 2021-07-25 NOTE — Progress Notes (Signed)
Mobility Specialist Progress Note    07/25/21 1249  Mobility  Activity Ambulated in hall  Level of Assistance Independent  Assistive Device None  Distance Ambulated (ft) 200 ft  Mobility Ambulated independently in hallway  Mobility Response Tolerated fair  Mobility performed by Mobility specialist  $Mobility charge 1 Mobility   During Mobility: 128 HR  Pt received in bed and agreeable. No complaints on walk but noticed patient significantly SOB and not able to carry conversation during. Needed a standing rest break to catch her breath. Returned to sitting EOB with call bell in reach.   [facility] Mobility Specialist  M.S. Primary Phone: [phone] M.S. Secondary Phone: [phone]

## 2021-07-25 NOTE — Progress Notes (Signed)
Progress Note  Patient Name: Lauren Osborn Date of Encounter: 07/25/2021  [facility] Cardiologist: Rozann Lesches, MD   Subjective   Patient sitting in chair   Breathing OK at rest   NO CP   Inpatient Medications    Scheduled Meds:  cholecalciferol  1,000 Units Oral Daily   enoxaparin (LOVENOX) injection  40 mg Subcutaneous Daily   pantoprazole  40 mg Oral Daily   Continuous Infusions:  PRN Meds: camphor-menthol, HYDROmorphone (DILAUDID) injection   Vital Signs    Vitals:   07/24/21 2330 07/25/21 0012 07/25/21 0452 07/25/21 0811  BP:   118/75 106/84  Pulse: 79 79 86 78  Resp:   20 20  Temp:   (!) 97.3 F (36.3 C) 97.7 F (36.5 C)  TempSrc:   Oral Oral  SpO2: 95% 95% 99% 95%  Weight:   (!) 141.5 kg   Height:        Intake/Output Summary (Last 24 hours) at 07/25/2021 0906 Last data filed at 07/24/2021 2115 Gross per 24 hour  Intake 240 ml  Output 350 ml  Net -110 ml   Last 3 Weights 07/25/2021 07/24/2021 07/23/2021  Weight (lbs) 312 lb 309 lb 11.2 oz 309 lb 12.8 oz  Weight (kg) 141.522 kg 140.479 kg 140.524 kg      Telemetry    SR  - Personally Reviewed  ECG    No new  - Personally Reviewed  Physical Exam   GEN: Morbidly obese 45 yo in no acute distress.   Neck: Neck full   Cardiac: RRR, no murmurs Respiratory: Clear to auscultation bilaterally  GI: OBese   Distended   Back  Edema pitting  MS: 3+   edema; No deformity. Neuro:  Nonfocal  Psych: Normal affect   Labs    High Sensitivity Troponin:  No results for input(s): TROPONINIHS in the last 720 hours.   Chemistry Recent Labs  Lab 07/22/21 1153 07/22/21 1755 07/23/21 0551 07/24/21 0210 07/25/21 0123  NA 135 135 136 135 134*  K 4.6 4.9 5.0 4.3 4.6  CL 96* 98 99 99 97*  CO2 26 25 26 26 26   GLUCOSE 91 85 93 97 97  BUN 44* 43* 46* 44* 49*  CREATININE 2.76* 2.76* 2.84* 3.21* 3.47*  CALCIUM 9.5 9.3 9.2 9.3 9.2  MG  --   --  2.0  --   --   PROT 7.9 7.7 7.2  --   --    ALBUMIN 4.1 4.0 3.7  --   --   AST 38 40 34  --   --   ALT 18 18 16   --   --   ALKPHOS 89 88 79  --   --   BILITOT 2.7* 2.4* 2.5*  --   --   GFRNONAA 21* 21* 20* 17* 16*  ANIONGAP 13 12 11 10 11     Lipids No results for input(s): CHOL, TRIG, HDL, LABVLDL, LDLCALC, CHOLHDL in the last 168 hours.  Hematology Recent Labs  Lab 07/22/21 1153 07/22/21 1755 07/23/21 0551  WBC 6.2 7.0 5.8  RBC 3.70* 3.71* 3.51*  HGB 11.8* 11.9* 11.2*  HCT 36.5 37.1 35.1*  MCV 98.6 100.0 100.0  MCH 31.9 32.1 31.9  MCHC 32.3 32.1 31.9  RDW 15.9* 15.9* 15.9*  PLT 269 274 245   Thyroid  Recent Labs  Lab 07/23/21 1721 07/24/21 0812  TSH 5.189*  --   FREET4 1.42* 1.49*    BNP Recent Labs  Lab 07/22/21  1755  BNP 510.0*    DDimer No results for input(s): DDIMER in the last 168 hours.   Radiology    US Venous Img Lower Unilateral Left (DVT)  Result Date: 07/23/2021 CLINICAL DATA:  Left leg swelling EXAM: LEFT LOWER EXTREMITY VENOUS DOPPLER ULTRASOUND TECHNIQUE: Gray-scale sonography with compression, as well as color and duplex ultrasound, were performed to evaluate the deep venous system(s) from the level of the common femoral vein through the popliteal and proximal calf veins. COMPARISON:  None. FINDINGS: VENOUS Normal compressibility of the common femoral, superficial femoral, and popliteal veins, as well as the visualized calf veins. Visualized portions of profunda femoral vein and great saphenous vein unremarkable. No filling defects to suggest DVT on grayscale or color Doppler imaging. Doppler waveforms show normal direction of venous flow, normal respiratory plasticity and response to augmentation. Limited views of the contralateral common femoral vein are unremarkable. OTHER There is a heterogeneous soft tissue mass in the anterior lower leg measuring 7.9 x 5.4 x 3.5 cm. Mild internal vascularity. Limitations: Limited assessment of the calf veins due to pitting edema and body habitus.  IMPRESSION: 1. Negative for DVT in the left lower extremity. Exam is somewhat limited particularly in the calf veins due to edema and body habitus. 2. Indeterminate heterogeneous soft tissue mass in the anterior lower leg measuring up to 7.9 cm. Tissue sampling may be considered. Per patient report mass has been enlarging. Electronically Signed   By: Audie Pinto M.D.   On: 07/23/2021 09:34   ECHOCARDIOGRAM COMPLETE  Result Date: 07/23/2021    ECHOCARDIOGRAM REPORT   Patient Name:   Lauren Osborn Date of Exam: 07/23/2021 Medical Rec #:  [id number]      Height:       63.0 in Accession #:    [id number]     Weight:       316.0 lb Date of Birth:  05/29/1976      BSA:          2.349 m Patient Age:    39 years       BP:           102/70 mmHg Patient Gender: F              HR:           91 bpm. Exam Location:  Inpatient Procedure: 2D Echo, Color Doppler and Cardiac Doppler Indications:    Q68.34 Acute diastolic (congestive) heart failure  History:        Patient has prior history of Echocardiogram examinations, most                 recent 03/24/2021. CHF; Risk Factors:Hypertension and Sleep                 Apnea.  Sonographer:    Raquel Sarna Senior RDCS Referring Phys: [id number] OLADAPO ADEFESO IMPRESSIONS  1. Left ventricular ejection fraction, by estimation, is 60 to 65%. The left ventricle has normal function. The left ventricle has no regional wall motion abnormalities. Left ventricular diastolic parameters were normal.  2. Right ventricular systolic function is normal. The right ventricular size is normal.  3. Left atrial size was mildly dilated.  4. The mitral valve is normal in structure. No evidence of mitral valve regurgitation. No evidence of mitral stenosis.  5. The aortic valve is tricuspid. Aortic valve regurgitation is not visualized. Aortic valve sclerosis is present, with no evidence of aortic valve stenosis. FINDINGS  Left Ventricle: Left ventricular ejection  fraction, by estimation, is 60 to 65%. The  left ventricle has normal function. The left ventricle has no regional wall motion abnormalities. The left ventricular internal cavity size was normal in size. There is  no left ventricular hypertrophy. Left ventricular diastolic parameters were normal. Right Ventricle: The right ventricular size is normal. Right ventricular systolic function is normal. Left Atrium: Left atrial size was mildly dilated. Right Atrium: Right atrial size was normal in size. Pericardium: There is no evidence of pericardial effusion. Mitral Valve: The mitral valve is normal in structure. No evidence of mitral valve regurgitation. No evidence of mitral valve stenosis. Tricuspid Valve: The tricuspid valve is normal in structure. Tricuspid valve regurgitation is mild . No evidence of tricuspid stenosis. Aortic Valve: The aortic valve is tricuspid. Aortic valve regurgitation is not visualized. Aortic valve sclerosis is present, with no evidence of aortic valve stenosis. Pulmonic Valve: The pulmonic valve was grossly normal. Pulmonic valve regurgitation is trivial. No evidence of pulmonic stenosis. Aorta: The aortic root is normal in size and structure. Venous: The inferior vena cava was not well visualized. IAS/Shunts: The interatrial septum was not well visualized.  LEFT VENTRICLE PLAX 2D LVIDd:         4.10 cm   Diastology LVIDs:         2.80 cm   LV e' medial:    12.20 cm/s LV PW:         0.70 cm   LV E/e' medial:  7.1 LV IVS:        0.60 cm   LV e' lateral:   12.90 cm/s LVOT diam:     1.80 cm   LV E/e' lateral: 6.7 LV SV:         46 LV SV Index:   20 LVOT Area:     2.54 cm  RIGHT VENTRICLE RV S prime:     10.20 cm/s TAPSE (M-mode): 1.6 cm LEFT ATRIUM             Index        RIGHT ATRIUM           Index LA diam:        4.90 cm 2.09 cm/m   RA Area:     22.30 cm LA Vol (A2C):   54.2 ml 23.08 ml/m  RA Volume:   70.70 ml  30.10 ml/m LA Vol (A4C):   75.9 ml 32.32 ml/m LA Biplane Vol: 65.0 ml 27.68 ml/m  AORTIC VALVE LVOT Vmax:   103.00  cm/s LVOT Vmean:  71.600 cm/s LVOT VTI:    0.181 m  AORTA Ao Root diam: 2.80 cm Ao Asc diam:  3.00 cm MITRAL VALVE               TRICUSPID VALVE MV Area (PHT): 3.30 cm    TR Peak grad:   20.4 mmHg MV Decel Time: 230 msec    TR Vmax:        226.00 cm/s MV E velocity: 86.50 cm/s MV A velocity: 55.70 cm/s  SHUNTS MV E/A ratio:  1.55        Systemic VTI:  0.18 m                            Systemic Diam: 1.80 cm Kirk Ruths MD Electronically signed by Kirk Ruths MD Signature Date/Time: 07/23/2021/2:47:50 PM    Final     Cardiac Studies   11/19/122 eft ventricular ejection fraction,  by estimation, is 60 to 65%. The left ventricle has normal function. The left ventricle has no regional wall motion abnormalities. Left ventricular diastolic parameters were normal. 1. 2. Right ventricular systolic function is normal. The right ventricular size is normal. 3. Left atrial size was mildly dilated. The mitral valve is normal in structure. No evidence of mitral valve regurgitation. No evidence of mitral stenosis. 4. The aortic valve is tricuspid. Aortic valve regurgitation is not visualized. Aortic valve sclerosis is present, with no evidence of aortic valve stenosis.  Patient Profile  a 45 y.o. 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 Josephine Cables  Assessment & Plan    1  Anasarca  Review echo from Saturday shows some septal flattening sugg of pulmonary HTN  as well as shift of interventricular septum with inspiration Will sched VQ scan today , r/o choronic PE   Plan on R heart cath tomorrow with Einar Crow   Reviewed with already    Hold diuretics given  Cr   Dietary has been consulted to review diet,  low Na, low carb  2  OSA  COntinue CPAP   3  Hepatic   Pt with cirrhosis and moderate ascites   Hemangioma liver Old    May need to consider paracentesis.       For questions or updates, please contact Jud Please consult [email or web address] for contact info under        Signed, Dorris Carnes, MD  07/25/2021, 9:06 AM

## 2021-07-25 NOTE — Progress Notes (Signed)
[facility] PROGRESS NOTE    Progress Note  Lauren Osborn  QIW:[id number] DOB: May 07, 1976 DOA: 07/22/2021 PCP: [name], MD     Brief Narrative:   Lauren Osborn is an 45 y.o. female past medical history significant for essential hypertension, hepatic cirrhosis severe morbid obesity, severe Obstructive Sleep Apnea, chronic kidney disease stage III-IV who presents to the emergency room due to shortness of breath and increased lower extremity swelling along with abdominal distention and weight gain for about 2 weeks about 20 to 30 pounds.  She has also been complaining of 1 week of right upper quadrant abdominal pain    Assessment/Plan:   Anasarca of unclear etiology  She is massively fluid overloaded she has edema up to her rib cage. She has a history of cirrhosis probably pulmonary hypertension concern about right-sided heart failure she will probably need a right heart cath. Her albumin is 4, urinary sodium is 10 despite IV Lasix, her specific gravity is 1005. Nephrology recommended to consult advanced heart failure team. She will need a repeat TSH and free T4.  Right upper quadrant abdominal pain: Cholelithiasis without cholecystitis on abdominal ultrasound.  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  Hepatic cirrhosis: Aldactone held, LFTs unremarkable.  History of hepatic hemangioma: Confirmed by MRI about the same size.  Essential hypertension: Her blood pressure is soft. Continue to hold her antihypertensive medication outside of her diuretics.  Lower extremity swelling: Lower extremity Doppler negative for DVT.  Severe morbid obesity: Counseling probably contributing to a lot of her problems.  Chronic kidney disease stage IIIb-IV: Her last creatinine was around 2.2 (back in January it was around 1.5) on admission 2.8. Her creatinine worsened  after diuresis nephrology was consulted recommended to consult the advanced heart failure team.  GERD: Continue PPI.  Anemia of chronic renal disease: Most likely due to renal failure no signs of overt bleeding.  Continue to monitor creatinine intermittently.   DVT prophylaxis: lovenox Family Communication:non Status is: Inpatient  Remains inpatient appropriate because: Acute systolic heart failure with abdominal pain.    Code Status:     Code Status Orders  (From admission, onward)           Start     Ordered   07/23/21 0100  Full code  Continuous        07/23/21 0059           Code Status History     Date Active Date Inactive Code Status Order ID Comments User Context   06/23/2021 2344 06/28/2021 2235 Full Code [id number]  Bethena Roys, MD ED   09/16/2020 0136 09/22/2020 2357 Full Code [id number]  Bernadette Hoit, DO ED   10/21/2018 1305 10/22/2018 1219 Full Code [id number]  Leta Baptist, MD Inpatient         IV Access:   Peripheral IV   Procedures and diagnostic studies:   US Venous Img Lower Unilateral Left (DVT)  Result Date: 07/23/2021 CLINICAL DATA:  Left leg swelling EXAM: LEFT LOWER EXTREMITY VENOUS DOPPLER ULTRASOUND TECHNIQUE: Gray-scale sonography with compression, as well as color and duplex ultrasound, were performed to evaluate the deep venous system(s) from the level of the common femoral vein through the popliteal and proximal calf veins. COMPARISON:  None. FINDINGS: VENOUS Normal compressibility of the common femoral, superficial femoral, and popliteal veins, as well as the visualized calf veins. Visualized portions of profunda  femoral vein and great saphenous vein unremarkable. No filling defects to suggest DVT on grayscale or color Doppler imaging. Doppler waveforms show normal direction of venous flow, normal respiratory plasticity and response to augmentation. Limited views of the contralateral common femoral vein are unremarkable. OTHER  There is a heterogeneous soft tissue mass in the anterior lower leg measuring 7.9 x 5.4 x 3.5 cm. Mild internal vascularity. Limitations: Limited assessment of the calf veins due to pitting edema and body habitus. IMPRESSION: 1. Negative for DVT in the left lower extremity. Exam is somewhat limited particularly in the calf veins due to edema and body habitus. 2. Indeterminate heterogeneous soft tissue mass in the anterior lower leg measuring up to 7.9 cm. Tissue sampling may be considered. Per patient report mass has been enlarging. Electronically Signed   By: Audie Pinto M.D.   On: 07/23/2021 09:34   ECHOCARDIOGRAM COMPLETE  Result Date: 07/23/2021    ECHOCARDIOGRAM REPORT   Patient Name:   Lauren Osborn Date of Exam: 07/23/2021 Medical Rec #:  [id number]      Height:       63.0 in Accession #:    [id number]     Weight:       316.0 lb Date of Birth:  Dec 17, 1975      BSA:          2.349 m Patient Age:    68 years       BP:           102/70 mmHg Patient Gender: F              HR:           91 bpm. Exam Location:  Inpatient Procedure: 2D Echo, Color Doppler and Cardiac Doppler Indications:    U72.53 Acute diastolic (congestive) heart failure  History:        Patient has prior history of Echocardiogram examinations, most                 recent 03/24/2021. CHF; Risk Factors:Hypertension and Sleep                 Apnea.  Sonographer:    Raquel Sarna Senior RDCS Referring Phys: [id number] OLADAPO ADEFESO IMPRESSIONS  1. Left ventricular ejection fraction, by estimation, is 60 to 65%. The left ventricle has normal function. The left ventricle has no regional wall motion abnormalities. Left ventricular diastolic parameters were normal.  2. Right ventricular systolic function is normal. The right ventricular size is normal.  3. Left atrial size was mildly dilated.  4. The mitral valve is normal in structure. No evidence of mitral valve regurgitation. No evidence of mitral stenosis.  5. The aortic valve is tricuspid. Aortic  valve regurgitation is not visualized. Aortic valve sclerosis is present, with no evidence of aortic valve stenosis. FINDINGS  Left Ventricle: Left ventricular ejection fraction, by estimation, is 60 to 65%. The left ventricle has normal function. The left ventricle has no regional wall motion abnormalities. The left ventricular internal cavity size was normal in size. There is  no left ventricular hypertrophy. Left ventricular diastolic parameters were normal. Right Ventricle: The right ventricular size is normal. Right ventricular systolic function is normal. Left Atrium: Left atrial size was mildly dilated. Right Atrium: Right atrial size was normal in size. Pericardium: There is no evidence of pericardial effusion. Mitral Valve: The mitral valve is normal in structure. No evidence of mitral valve regurgitation. No evidence of mitral valve stenosis. Tricuspid Valve: The tricuspid  valve is normal in structure. Tricuspid valve regurgitation is mild . No evidence of tricuspid stenosis. Aortic Valve: The aortic valve is tricuspid. Aortic valve regurgitation is not visualized. Aortic valve sclerosis is present, with no evidence of aortic valve stenosis. Pulmonic Valve: The pulmonic valve was grossly normal. Pulmonic valve regurgitation is trivial. No evidence of pulmonic stenosis. Aorta: The aortic root is normal in size and structure. Venous: The inferior vena cava was not well visualized. IAS/Shunts: The interatrial septum was not well visualized.  LEFT VENTRICLE PLAX 2D LVIDd:         4.10 cm   Diastology LVIDs:         2.80 cm   LV e' medial:    12.20 cm/s LV PW:         0.70 cm   LV E/e' medial:  7.1 LV IVS:        0.60 cm   LV e' lateral:   12.90 cm/s LVOT diam:     1.80 cm   LV E/e' lateral: 6.7 LV SV:         46 LV SV Index:   20 LVOT Area:     2.54 cm  RIGHT VENTRICLE RV S prime:     10.20 cm/s TAPSE (M-mode): 1.6 cm LEFT ATRIUM             Index        RIGHT ATRIUM           Index LA diam:        4.90 cm  2.09 cm/m   RA Area:     22.30 cm LA Vol (A2C):   54.2 ml 23.08 ml/m  RA Volume:   70.70 ml  30.10 ml/m LA Vol (A4C):   75.9 ml 32.32 ml/m LA Biplane Vol: 65.0 ml 27.68 ml/m  AORTIC VALVE LVOT Vmax:   103.00 cm/s LVOT Vmean:  71.600 cm/s LVOT VTI:    0.181 m  AORTA Ao Root diam: 2.80 cm Ao Asc diam:  3.00 cm MITRAL VALVE               TRICUSPID VALVE MV Area (PHT): 3.30 cm    TR Peak grad:   20.4 mmHg MV Decel Time: 230 msec    TR Vmax:        226.00 cm/s MV E velocity: 86.50 cm/s MV A velocity: 55.70 cm/s  SHUNTS MV E/A ratio:  1.55        Systemic VTI:  0.18 m                            Systemic Diam: 1.80 cm Kirk Ruths MD Electronically signed by Kirk Ruths MD Signature Date/Time: 07/23/2021/2:47:50 PM    Final      Medical Consultants:   None.   Subjective:    Lauren Osborn does short of breath with ambulation and cannot lay flat to sleep  Objective:    Vitals:   07/24/21 2330 07/25/21 0012 07/25/21 0452 07/25/21 0811  BP:   118/75 106/84  Pulse: 79 79 86 78  Resp:   20 20  Temp:   (!) 97.3 F (36.3 C) 97.7 F (36.5 C)  TempSrc:   Oral Oral  SpO2: 95% 95% 99% 95%  Weight:   (!) 141.5 kg   Height:       SpO2: 95 % O2 Flow Rate (L/min): 0 L/min FiO2 (%): 21 %   Intake/Output Summary (  Last 24 hours) at 07/25/2021 1287 Last data filed at 07/24/2021 2115 Gross per 24 hour  Intake 240 ml  Output 350 ml  Net -110 ml    Filed Weights   07/23/21 1609 07/24/21 0452 07/25/21 0452  Weight: (!) 140.5 kg (!) 140.5 kg (!) 141.5 kg    Exam: General exam: In no acute distress, she is massively fluid overloaded Respiratory system: Good air movement and clear to auscultation. Cardiovascular system: S1 & S2 heard, RRR. No JVD. Gastrointestinal system: Abdomen is nondistended, soft and nontender.  Extremities: Edema up to her rib cage Skin: No rashes, lesions or ulcers Psychiatry: Judgement and insight appear normal. Mood & affect appropriate.   Data Reviewed:     Labs: Basic Metabolic Panel: Recent Labs  Lab 07/22/21 1148 07/22/21 1153 07/22/21 1755 07/23/21 0551 07/24/21 0210 07/25/21 0123  NA 136 135 135 136 135 134*  K 4.6 4.6 4.9 5.0 4.3 4.6  CL 97* 96* 98 99 99 97*  CO2 27 26 25 26 26 26   GLUCOSE 92 91 85 93 97 97  BUN 44* 44* 43* 46* 44* 49*  CREATININE 2.76* 2.76* 2.76* 2.84* 3.21* 3.47*  CALCIUM 9.6 9.5 9.3 9.2 9.3 9.2  MG  --   --   --  2.0  --   --   PHOS 4.1  --   --  4.4  --   --     GFR Estimated Creatinine Clearance: 27.8 mL/min (A) (by C-G formula based on SCr of 3.47 mg/dL (H)). Liver Function Tests: Recent Labs  Lab 07/22/21 1144 07/22/21 1148 07/22/21 1153 07/22/21 1755 07/23/21 0551  AST 38  --  38 40 34  ALT 18  --  18 18 16   ALKPHOS 84  --  89 88 79  BILITOT 2.7*  --  2.7* 2.4* 2.5*  PROT 7.8  --  7.9 7.7 7.2  ALBUMIN 4.1 4.0 4.1 4.0 3.7    Recent Labs  Lab 07/22/21 1144 07/22/21 1755  LIPASE 41 46    No results for input(s): AMMONIA in the last 168 hours. Coagulation profile Recent Labs  Lab 07/22/21 1755  INR 1.2    COVID-19 Labs  Recent Labs    07/22/21 1147  FERRITIN 198     Lab Results  Component Value Date   SARSCOV2NAA NEGATIVE 07/22/2021   [place] NEGATIVE 06/23/2021   [place] NEGATIVE 09/15/2020   [place] NEGATIVE 08/02/2020    CBC: Recent Labs  Lab 07/22/21 1144 07/22/21 1153 07/22/21 1755 07/23/21 0551  WBC 6.0 6.2 7.0 5.8  NEUTROABS 4.4 4.5 5.2  --   HGB 11.9* 11.8* 11.9* 11.2*  HCT 37.0 36.5 37.1 35.1*  MCV 97.6 98.6 100.0 100.0  PLT 265 269 274 245    Cardiac Enzymes: No results for input(s): CKTOTAL, CKMB, CKMBINDEX, TROPONINI in the last 168 hours. BNP (last 3 results) No results for input(s): PROBNP in the last 8760 hours. CBG: No results for input(s): GLUCAP in the last 168 hours. D-Dimer: No results for input(s): DDIMER in the last 72 hours. Hgb A1c: No results for input(s): HGBA1C in the last 72 hours. Lipid Profile: No  results for input(s): CHOL, HDL, LDLCALC, TRIG, CHOLHDL, LDLDIRECT in the last 72 hours. Thyroid function studies: Recent Labs    07/23/21 1721  TSH 5.189*    Anemia work up: Recent Labs    07/22/21 1144 07/22/21 1147  FERRITIN  --  198  TIBC 343 348  IRON 117 118  Sepsis Labs: Recent Labs  Lab 07/22/21 1144 07/22/21 1153 07/22/21 1755 07/23/21 0551  WBC 6.0 6.2 7.0 5.8    Microbiology Recent Results (from the past 240 hour(s))  Resp Panel by RT-PCR (Flu A&B, Covid) Nasopharyngeal Swab     Status: None   Collection Time: 07/22/21  9:54 PM   Specimen: Nasopharyngeal Swab; Nasopharyngeal(NP) swabs in vial transport medium  Result Value Ref Range Status   SARS Coronavirus 2 by RT PCR NEGATIVE NEGATIVE Final    Comment: (NOTE) SARS-CoV-2 target nucleic acids are NOT DETECTED.  The SARS-CoV-2 RNA is generally detectable in upper respiratory specimens during the acute phase of infection. The lowest concentration of SARS-CoV-2 viral copies this assay can detect is 138 copies/mL. A negative result does not preclude SARS-Cov-2 infection and should not be used as the sole basis for treatment or other patient management decisions. A negative result may occur with  improper specimen collection/handling, submission of specimen other than nasopharyngeal swab, presence of viral mutation(s) within the areas targeted by this assay, and inadequate number of viral copies(<138 copies/mL). A negative result must be combined with clinical observations, patient history, and epidemiological information. The expected result is Negative.  Fact Sheet for Patients:  EntrepreneurPulse.com.au  Fact Sheet for Healthcare Providers:  IncredibleEmployment.be  This test is no t yet approved or cleared by the [place] FDA and  has been authorized for detection and/or diagnosis of SARS-CoV-2 by FDA under an Emergency Use Authorization (EUA). This EUA  will remain  in effect (meaning this test can be used) for the duration of the COVID-19 declaration under Section 564(b)(1) of the Act, 21 U.S.C.section 360bbb-3(b)(1), unless the authorization is terminated  or revoked sooner.       Influenza A by PCR NEGATIVE NEGATIVE Final   Influenza B by PCR NEGATIVE NEGATIVE Final    Comment: (NOTE) The Xpert Xpress SARS-CoV-2/FLU/RSV plus assay is intended as an aid in the diagnosis of influenza from Nasopharyngeal swab specimens and should not be used as a sole basis for treatment. Nasal washings and aspirates are unacceptable for Xpert Xpress SARS-CoV-2/FLU/RSV testing.  Fact Sheet for Patients: EntrepreneurPulse.com.au  Fact Sheet for Healthcare Providers: IncredibleEmployment.be  This test is not yet approved or cleared by the [place] FDA and has been authorized for detection and/or diagnosis of SARS-CoV-2 by FDA under an Emergency Use Authorization (EUA). This EUA will remain in effect (meaning this test can be used) for the duration of the COVID-19 declaration under Section 564(b)(1) of the Act, 21 U.S.C. section 360bbb-3(b)(1), unless the authorization is terminated or revoked.  Performed at [facility], [address], [place],  [address]      Medications:    cholecalciferol  1,000 Units Oral Daily   enoxaparin (LOVENOX) injection  40 mg Subcutaneous Daily   pantoprazole  40 mg Oral Daily   Continuous Infusions:    LOS: 3 days   Charlynne Cousins  [facility]  07/25/2021, 8:23 AM

## 2021-07-25 NOTE — Progress Notes (Addendum)
Nutrition Education Note  RD consulted for nutrition education regarding a Heart Healthy carb modified low sodium 24 gm/d added sugar diet with 1200 ml fluid restriction.   Lipid Panel  No results found for: CHOL, TRIG, HDL, CHOLHDL, VLDL, LDLCALC  RD provided "Heart Healthy Carbohydrate Consistent Nutrition Therapy" handout from the Academy of Nutrition and Dietetics. Reviewed patient's dietary recall. Provided examples on ways to decrease sodium and fat intake in diet. Discouraged intake of processed foods and use of salt shaker. Encouraged fresh fruits and vegetables as well as whole grain sources of carbohydrates to maximize fiber intake. Teach back method used.  Expect good compliance. Patient would benefit from outpatient education.  Body mass index is 55.27 kg/m. Pt meets criteria for class 3, extreme/morbid obesity based on current BMI.  Current diet order is Heart Healthy, 1200 ml fluid restriction, 2 gm sodium, patient is consuming approximately 100% of meals at this time. Labs and medications reviewed. No further nutrition interventions warranted at this time. RD contact information provided. If additional nutrition issues arise, please re-consult RD.  Lucas Mallow, RD, LDN, CNSC Please refer to [facility] for contact information.

## 2021-07-26 ENCOUNTER — Inpatient Hospital Stay (HOSPITAL_COMMUNITY)
Admission: EM | Disposition: A | Discharge status: Home Health | Payer: Self-pay | Source: Home / Self Care | Attending: Cardiology

## 2021-07-26 DIAGNOSIS — R7989 Other specified abnormal findings of blood chemistry: Secondary | ICD-10-CM | POA: Diagnosis not present

## 2021-07-26 DIAGNOSIS — I509 Heart failure, unspecified: Secondary | ICD-10-CM | POA: Diagnosis not present

## 2021-07-26 DIAGNOSIS — N179 Acute kidney failure, unspecified: Secondary | ICD-10-CM | POA: Diagnosis not present

## 2021-07-26 DIAGNOSIS — N184 Chronic kidney disease, stage 4 (severe): Secondary | ICD-10-CM | POA: Diagnosis not present

## 2021-07-26 DIAGNOSIS — K7460 Unspecified cirrhosis of liver: Secondary | ICD-10-CM

## 2021-07-26 DIAGNOSIS — N183 Chronic kidney disease, stage 3 unspecified: Secondary | ICD-10-CM | POA: Diagnosis not present

## 2021-07-26 DIAGNOSIS — I5033 Acute on chronic diastolic (congestive) heart failure: Secondary | ICD-10-CM | POA: Diagnosis not present

## 2021-07-26 DIAGNOSIS — R601 Generalized edema: Secondary | ICD-10-CM | POA: Diagnosis not present

## 2021-07-26 HISTORY — PX: RIGHT HEART CATH: CATH118263

## 2021-07-26 LAB — BASIC METABOLIC PANEL
Anion gap: 13 (ref 5–15)
Anion gap: 13 (ref 5–15)
BUN: 57 mg/dL — ABNORMAL HIGH (ref 6–20)
BUN: 58 mg/dL — ABNORMAL HIGH (ref 6–20)
CO2: 24 mmol/L (ref 22–32)
CO2: 24 mmol/L (ref 22–32)
Calcium: 9.4 mg/dL (ref 8.9–10.3)
Calcium: 9.3 mg/dL (ref 8.9–10.3)
Chloride: 96 mmol/L — ABNORMAL LOW (ref 98–111)
Chloride: 95 mmol/L — ABNORMAL LOW (ref 98–111)
Creatinine, Ser: 3.58 mg/dL — ABNORMAL HIGH (ref 0.44–1.00)
Creatinine, Ser: 3.62 mg/dL — ABNORMAL HIGH (ref 0.44–1.00)
GFR, Estimated: 15 mL/min — ABNORMAL LOW (ref >60)
GFR, Estimated: 15 mL/min — ABNORMAL LOW (ref >60)
Glucose, Bld: 91 mg/dL (ref 70–99)
Glucose, Bld: 87 mg/dL (ref 70–99)
Potassium: 4.2 mmol/L (ref 3.5–5.1)
Potassium: 4.1 mmol/L (ref 3.5–5.1)
Sodium: 133 mmol/L — ABNORMAL LOW (ref 135–145)
Sodium: 132 mmol/L — ABNORMAL LOW (ref 135–145)

## 2021-07-26 LAB — POCT I-STAT EG7
Acid-Base Excess: 1 mmol/L (ref 0.0–2.0)
Acid-Base Excess: 2 mmol/L (ref 0.0–2.0)
Bicarbonate: 26.2 mmol/L (ref 20.0–28.0)
Bicarbonate: 26.3 mmol/L (ref 20.0–28.0)
Calcium, Ion: 1.16 mmol/L (ref 1.15–1.40)
Calcium, Ion: 1.16 mmol/L (ref 1.15–1.40)
HCT: 33 % — ABNORMAL LOW (ref 36.0–46.0)
HCT: 33 % — ABNORMAL LOW (ref 36.0–46.0)
Hemoglobin: 11.2 g/dL — ABNORMAL LOW (ref 12.0–15.0)
Hemoglobin: 11.2 g/dL — ABNORMAL LOW (ref 12.0–15.0)
O2 Saturation: 70 %
O2 Saturation: 71 %
Potassium: 3.9 mmol/L (ref 3.5–5.1)
Potassium: 4 mmol/L (ref 3.5–5.1)
Sodium: 135 mmol/L (ref 135–145)
Sodium: 135 mmol/L (ref 135–145)
TCO2: 27 mmol/L (ref 22–32)
TCO2: 28 mmol/L (ref 22–32)
pCO2, Ven: 41 mmHg — ABNORMAL LOW (ref 44.0–60.0)
pCO2, Ven: 41 mmHg — ABNORMAL LOW (ref 44.0–60.0)
pH, Ven: 7.413 (ref 7.250–7.430)
pH, Ven: 7.415 (ref 7.250–7.430)
pO2, Ven: 36 mmHg (ref 32.0–45.0)
pO2, Ven: 37 mmHg (ref 32.0–45.0)

## 2021-07-26 LAB — MAGNESIUM: Magnesium: 2.1 mg/dL (ref 1.7–2.4)

## 2021-07-26 SURGERY — RIGHT HEART CATH
Anesthesia: LOCAL

## 2021-07-26 MED ORDER — METOLAZONE 5 MG PO TABS
5.0000 mg | ORAL_TABLET | Freq: Once | ORAL | Status: AC
  Administered 2021-07-26: 5 mg via ORAL
  Filled 2021-07-26: qty 1

## 2021-07-26 MED ORDER — SODIUM CHLORIDE 0.9 % IV SOLN: 250.0000 mL | INTRAVENOUS | Status: DC | PRN

## 2021-07-26 MED ORDER — HEPARIN (PORCINE) IN NACL 1000-0.9 UT/500ML-% IV SOLN
INTRAVENOUS | Status: AC
  Filled 2021-07-26: qty 500

## 2021-07-26 MED ORDER — LIDOCAINE HCL (PF) 1 % IJ SOLN
INTRAMUSCULAR | Status: DC | PRN
  Administered 2021-07-26: 2 mL

## 2021-07-26 MED ORDER — ASPIRIN 81 MG PO CHEW: 81.0000 mg | CHEWABLE_TABLET | ORAL | Status: DC

## 2021-07-26 MED ORDER — SODIUM CHLORIDE 0.9% FLUSH: 3.0000 mL | INTRAVENOUS | Status: DC | PRN

## 2021-07-26 MED ORDER — SODIUM CHLORIDE 0.9% FLUSH
3.0000 mL | Freq: Two times a day (BID) | INTRAVENOUS | Status: DC
  Administered 2021-07-26 – 2021-08-04 (×13): 3 mL via INTRAVENOUS
  Administered 2021-08-04: 09:00:00 8 mL via INTRAVENOUS
  Administered 2021-08-05 – 2021-08-15 (×17): 3 mL via INTRAVENOUS

## 2021-07-26 MED ORDER — LIDOCAINE HCL (PF) 1 % IJ SOLN
INTRAMUSCULAR | Status: AC
  Filled 2021-07-26: qty 30

## 2021-07-26 MED ORDER — FUROSEMIDE 10 MG/ML IJ SOLN
30.0000 mg/h | INTRAVENOUS | Status: DC
  Administered 2021-07-26: 11:00:00 15 mg/h via INTRAVENOUS
  Administered 2021-07-27: 20 mg/h via INTRAVENOUS
  Administered 2021-07-27: 15 mg/h via INTRAVENOUS
  Administered 2021-07-27: 21:00:00 20 mg/h via INTRAVENOUS
  Administered 2021-07-28 (×2): 30 mg/h via INTRAVENOUS
  Administered 2021-07-28: 20 mg/h via INTRAVENOUS
  Administered 2021-07-29 (×2): 30 mg/h via INTRAVENOUS
  Filled 2021-07-26 (×12): qty 20

## 2021-07-26 MED ORDER — ASPIRIN 81 MG PO CHEW
81.0000 mg | CHEWABLE_TABLET | ORAL | Status: AC
  Administered 2021-07-26: 81 mg via ORAL
  Filled 2021-07-26: qty 1

## 2021-07-26 MED ORDER — SODIUM CHLORIDE 0.9% FLUSH: 3.0000 mL | Freq: Two times a day (BID) | INTRAVENOUS | Status: DC

## 2021-07-26 MED ORDER — FUROSEMIDE 10 MG/ML IJ SOLN
80.0000 mg | Freq: Once | INTRAMUSCULAR | Status: AC
  Administered 2021-07-26: 80 mg via INTRAVENOUS
  Filled 2021-07-26: qty 8

## 2021-07-26 MED ORDER — SODIUM CHLORIDE 0.9 % IV SOLN: INTRAVENOUS | Status: DC

## 2021-07-26 SURGICAL SUPPLY — 6 items
CATH BALLN WEDGE 5F 110CM (CATHETERS) ×1 IMPLANT
PACK CARDIAC CATHETERIZATION (CUSTOM PROCEDURE TRAY) ×2 IMPLANT
SHEATH GLIDE SLENDER 4/5FR (SHEATH) ×1 IMPLANT
TRANSDUCER W/STOPCOCK (MISCELLANEOUS) ×2 IMPLANT
TUBING ART PRESS 72  MALE/FEM (TUBING) ×2
TUBING ART PRESS 72 MALE/FEM (TUBING) IMPLANT

## 2021-07-26 NOTE — Progress Notes (Signed)
[facility] PROGRESS NOTE    Progress Note  Lauren Osborn  YNW:[id number] DOB: Jan 31, 1976 DOA: 07/22/2021 PCP: [name], MD     Brief Narrative:   Lauren Osborn is an 45 y.o. female past medical history significant for essential hypertension, hepatic cirrhosis severe morbid obesity, severe Obstructive Sleep Apnea, chronic kidney disease stage III-IV who presents to the emergency room due to shortness of breath and increased lower extremity swelling along with abdominal distention and weight gain for about 2 weeks about 20 to 30 pounds.  She has also been complaining of 1 week of right upper quadrant abdominal pain    Assessment/Plan:   Anasarca of unclear etiology  She is massively fluid overloaded she has edema up to her rib cage. She has a history of cirrhosis probably pulmonary hypertension concern about right-sided heart failure for right heart cath on 07/27/2019. 2D echo showed right systolic function mildly dilated with D-shaped septum.   She will need a repeat TSH and free T4. Was started on IV Lasix drip pneumoboots placed.  Right upper quadrant abdominal pain: Cholelithiasis without cholecystitis on abdominal ultrasound.  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  Hepatic cirrhosis: Aldactone held, LFTs unremarkable.  History of hepatic hemangioma: Confirmed by MRI about the same size.  Essential hypertension: Her blood pressure is soft. Continue to hold her antihypertensive medication outside of her diuretics.  Lower extremity swelling: Lower extremity Doppler negative for DVT.  Severe morbid obesity: Counseling probably contributing to a lot of her problems.  Chronic kidney disease stage IIIb-IV: Her last creatinine was around 2.2 (back in January it was around 1.5) on admission 2.8. Hopefully creatinine will improve with IV  diuresis  GERD: Continue PPI.  Anemia of chronic renal disease: Most likely due to renal failure no signs of overt bleeding.  Continue to monitor creatinine intermittently.   DVT prophylaxis: lovenox Family Communication:non Status is: Inpatient  Remains inpatient appropriate because: Acute systolic heart failure with abdominal pain.    Code Status:     Code Status Orders  (From admission, onward)           Start     Ordered   07/23/21 0100  Full code  Continuous        07/23/21 0059           Code Status History     Date Active Date Inactive Code Status Order ID Comments User Context   06/23/2021 2344 06/28/2021 2235 Full Code [id number]  Bethena Roys, MD ED   09/16/2020 0136 09/22/2020 2357 Full Code [id number]  Bernadette Hoit, DO ED   10/21/2018 1305 10/22/2018 1219 Full Code [id number]  Leta Baptist, MD Inpatient         IV Access:   Peripheral IV   Procedures and diagnostic studies:   NM Pulmonary Perfusion  Result Date: 07/25/2021 CLINICAL DATA:  Short of breath, history of tobacco abuse EXAM: NUCLEAR MEDICINE PERFUSION LUNG SCAN TECHNIQUE: Perfusion images were obtained in multiple projections after intravenous injection of radiopharmaceutical. Ventilation scans intentionally deferred if perfusion scan and chest x-ray adequate for interpretation during COVID 19 epidemic. RADIOPHARMACEUTICALS:  4.4 mCi Tc-45m MAA IV COMPARISON:  07/25/2021 FINDINGS: Planar images of the chest are obtained during the perfusion examination. There are no wedge-shaped perfusion defects to suggest pulmonary embolus. Symmetrical distribution of radiotracer seen bilaterally. IMPRESSION: 1. No evidence of pulmonary embolus. Electronically Signed  By: Randa Ngo M.D.   On: 07/25/2021 15:50   DG Chest Port 1 View  Result Date: 07/25/2021 CLINICAL DATA:  Dyspnea EXAM: PORTABLE CHEST 1 VIEW COMPARISON:  07/22/2021 FINDINGS: Single frontal view of the chest demonstrates an  unremarkable cardiac silhouette. No airspace disease, effusion, or pneumothorax. No acute bony abnormalities. IMPRESSION: 1. Stable exam, no acute process. Electronically Signed   By: Randa Ngo M.D.   On: 07/25/2021 15:49     Medical Consultants:   None.   Subjective:    Lauren Osborn symptoms are unchanged.  Objective:    Vitals:   07/25/21 0811 07/25/21 1700 07/25/21 2032 07/26/21 0400  BP: 106/84 105/83 119/80 90/65  Pulse: 78 93 88 70  Resp: 20 18 18 18   Temp: 97.7 F (36.5 C) 97.8 F (36.6 C) 98.3 F (36.8 C) 98.3 F (36.8 C)  TempSrc: Oral Oral Oral Oral  SpO2: 95% 100% 96% 99%  Weight:    (!) 142.4 kg  Height:       SpO2: 99 % O2 Flow Rate (L/min): 0 L/min FiO2 (%): 21 %   Intake/Output Summary (Last 24 hours) at 07/26/2021 1045 Last data filed at 07/25/2021 1515 Gross per 24 hour  Intake 44.53 ml  Output --  Net 44.53 ml    Filed Weights   07/24/21 0452 07/25/21 0452 07/26/21 0400  Weight: (!) 140.5 kg (!) 141.5 kg (!) 142.4 kg    Exam: General exam: In no acute distress, massively fluid overloaded Respiratory system: Good air movement and clear to auscultation. Cardiovascular system: S1 & S2 heard, RRR. No JVD. Gastrointestinal system: Abdomen is nondistended, soft and nontender.  Extremities: 3+ edema, she has edema up to her rib cage Psychiatry: Judgement and insight appear normal. Mood & affect appropriate.   Data Reviewed:    Labs: Basic Metabolic Panel: Recent Labs  Lab 07/22/21 1148 07/22/21 1153 07/22/21 1755 07/23/21 0551 07/24/21 0210 07/25/21 0123 07/26/21 0224  NA 136   < > 135 136 135 134* 133*  K 4.6   < > 4.9 5.0 4.3 4.6 4.2  CL 97*   < > 98 99 99 97* 96*  CO2 27   < > 25 26 26 26 24   GLUCOSE 92   < > 85 93 97 97 91  BUN 44*   < > 43* 46* 44* 49* 57*  CREATININE 2.76*   < > 2.76* 2.84* 3.21* 3.47* 3.58*  CALCIUM 9.6   < > 9.3 9.2 9.3 9.2 9.4  MG  --   --   --  2.0  --   --   --   PHOS 4.1  --   --  4.4  --    --   --    < > = values in this interval not displayed.    GFR Estimated Creatinine Clearance: 27.1 mL/min (A) (by C-G formula based on SCr of 3.58 mg/dL (H)). Liver Function Tests: Recent Labs  Lab 07/22/21 1144 07/22/21 1148 07/22/21 1153 07/22/21 1755 07/23/21 0551  AST 38  --  38 40 34  ALT 18  --  18 18 16   ALKPHOS 84  --  89 88 79  BILITOT 2.7*  --  2.7* 2.4* 2.5*  PROT 7.8  --  7.9 7.7 7.2  ALBUMIN 4.1 4.0 4.1 4.0 3.7    Recent Labs  Lab 07/22/21 1144 07/22/21 1755  LIPASE 41 46    No results for input(s): AMMONIA in the last 168  hours. Coagulation profile Recent Labs  Lab 07/22/21 1755  INR 1.2    COVID-19 Labs  No results for input(s): DDIMER, FERRITIN, LDH, CRP in the last 72 hours.   Lab Results  Component Value Date   SARSCOV2NAA NEGATIVE 07/22/2021   [place] NEGATIVE 06/23/2021   [place] NEGATIVE 09/15/2020   [place] NEGATIVE 08/02/2020    CBC: Recent Labs  Lab 07/22/21 1144 07/22/21 1153 07/22/21 1755 07/23/21 0551  WBC 6.0 6.2 7.0 5.8  NEUTROABS 4.4 4.5 5.2  --   HGB 11.9* 11.8* 11.9* 11.2*  HCT 37.0 36.5 37.1 35.1*  MCV 97.6 98.6 100.0 100.0  PLT 265 269 274 245    Cardiac Enzymes: No results for input(s): CKTOTAL, CKMB, CKMBINDEX, TROPONINI in the last 168 hours. BNP (last 3 results) No results for input(s): PROBNP in the last 8760 hours. CBG: No results for input(s): GLUCAP in the last 168 hours. D-Dimer: No results for input(s): DDIMER in the last 72 hours. Hgb A1c: No results for input(s): HGBA1C in the last 72 hours. Lipid Profile: No results for input(s): CHOL, HDL, LDLCALC, TRIG, CHOLHDL, LDLDIRECT in the last 72 hours. Thyroid function studies: Recent Labs    07/23/21 1721  TSH 5.189*    Anemia work up: No results for input(s): VITAMINB12, FOLATE, FERRITIN, TIBC, IRON, RETICCTPCT in the last 72 hours.  Sepsis Labs: Recent Labs  Lab 07/22/21 1144 07/22/21 1153 07/22/21 1755 07/23/21 0551   WBC 6.0 6.2 7.0 5.8    Microbiology Recent Results (from the past 240 hour(s))  Resp Panel by RT-PCR (Flu A&B, Covid) Nasopharyngeal Swab     Status: None   Collection Time: 07/22/21  9:54 PM   Specimen: Nasopharyngeal Swab; Nasopharyngeal(NP) swabs in vial transport medium  Result Value Ref Range Status   SARS Coronavirus 2 by RT PCR NEGATIVE NEGATIVE Final    Comment: (NOTE) SARS-CoV-2 target nucleic acids are NOT DETECTED.  The SARS-CoV-2 RNA is generally detectable in upper respiratory specimens during the acute phase of infection. The lowest concentration of SARS-CoV-2 viral copies this assay can detect is 138 copies/mL. A negative result does not preclude SARS-Cov-2 infection and should not be used as the sole basis for treatment or other patient management decisions. A negative result may occur with  improper specimen collection/handling, submission of specimen other than nasopharyngeal swab, presence of viral mutation(s) within the areas targeted by this assay, and inadequate number of viral copies(<138 copies/mL). A negative result must be combined with clinical observations, patient history, and epidemiological information. The expected result is Negative.  Fact Sheet for Patients:  EntrepreneurPulse.com.au  Fact Sheet for Healthcare Providers:  IncredibleEmployment.be  This test is no t yet approved or cleared by the [place] FDA and  has been authorized for detection and/or diagnosis of SARS-CoV-2 by FDA under an Emergency Use Authorization (EUA). This EUA will remain  in effect (meaning this test can be used) for the duration of the COVID-19 declaration under Section 564(b)(1) of the Act, 21 U.S.C.section 360bbb-3(b)(1), unless the authorization is terminated  or revoked sooner.       Influenza A by PCR NEGATIVE NEGATIVE Final   Influenza B by PCR NEGATIVE NEGATIVE Final    Comment: (NOTE) The Xpert Xpress  SARS-CoV-2/FLU/RSV plus assay is intended as an aid in the diagnosis of influenza from Nasopharyngeal swab specimens and should not be used as a sole basis for treatment. Nasal washings and aspirates are unacceptable for Xpert Xpress SARS-CoV-2/FLU/RSV testing.  Fact Sheet for Patients: EntrepreneurPulse.com.au  Fact Sheet for Healthcare Providers: IncredibleEmployment.be  This test is not yet approved or cleared by the [place] FDA and has been authorized for detection and/or diagnosis of SARS-CoV-2 by FDA under an Emergency Use Authorization (EUA). This EUA will remain in effect (meaning this test can be used) for the duration of the COVID-19 declaration under Section 564(b)(1) of the Act, 21 U.S.C. section 360bbb-3(b)(1), unless the authorization is terminated or revoked.  Performed at [facility], [address], [place], [place] [address]      Medications:    cholecalciferol  1,000 Units Oral Daily   enoxaparin (LOVENOX) injection  40 mg Subcutaneous Daily   pantoprazole  40 mg Oral Daily   sodium chloride flush  3 mL Intravenous Q12H   sodium chloride flush  3 mL Intravenous Q12H   Continuous Infusions:  sodium chloride     sodium chloride     furosemide (LASIX) 200 mg in dextrose 5% 100 mL (2mg /mL) infusion        LOS: 4 days   Charlynne Cousins  [facility]  07/26/2021, 10:45 AM

## 2021-07-26 NOTE — Progress Notes (Signed)
This chaplain is present at the Pt. bedside for Advance Directive F/U.   The chaplain understands the Pt. and her daughter-Paige talked about HCPOA and will continue the discussion on the Living Will. The Pt. acknowledges the strength and positivity in her relationship with Paige. The chaplain continued AD education with the Pt. and provided the opportunity for completing the AD in the hospital. The chaplain will F/U with the Pt. on Monday to clarify the need for a notary.   This chaplain is available for F/U spiritual care as needed.  Chaplain Sallyanne Kuster [phone]

## 2021-07-26 NOTE — Progress Notes (Signed)
Orthopedic Tech Progress Note Patient Details:  Lauren Osborn 04/02/1976 [id number]  Ortho Devices Type of Ortho Device: Haematologist Ortho Device/Splint Location: both legs Ortho Device/Splint Interventions: Ordered, Application   Post Interventions Patient Tolerated: Well  Charline Bills Antony Sian 07/26/2021, 10:43 AM

## 2021-07-26 NOTE — Consult Note (Addendum)
Advanced Heart Failure Team Consult Note   Primary Physician: [name], MD PCP-Cardiologist:  Rozann Lesches, MD  Reason for Consultation: Heart Failure   HPI:    Lauren Osborn is seen today for evaluation of HFpEF at the request of Dr Harrington Challenger.    Lauren Osborn is a 45 y.o. 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   Admitted 06/25/21 with volume overload. Diuresed with IV lasix and transitioned to torsemide 80 mg/60 mg + metolazone 3 times a week. D/c weight 308 pounds which was unchanged from admit.   Weight has been over 300 pounds for at least a year. Having ongoing lower edema. Uses motorized scooter in [place]. Works as Presenter, broadcasting.   Presented to [facility] with RUQ and increased dyspnea. CXR no acute process. SARS 2 negative. BNP 510. Diuresing with IV lasix but stopped on 11/20 due to elevated creatinine.  VQ - negative PE. Lower extremity doppler negative for DVT. Echo repeat EF remains preserved. Worsening renal function. Creatinine 2.8>3.2>3.5>3.6.   Review of Systems: [y] = yes, [ ]  = no   General: Weight gain [ Y]; Weight loss [ ] ; Anorexia [ ] ; Fatigue [ Y]; Fever [ ] ; Chills [ ] ; Weakness [ ]   Cardiac: Chest pain/pressure [ ] ; Resting SOB [ ] ; Exertional SOB [ Y]; Orthopnea [ ] ; Pedal Edema [ Y]; Palpitations [ ] ; Syncope [ ] ; Presyncope [ ] ; Paroxysmal nocturnal dyspnea[ ]   Pulmonary: Cough [ ] ; Wheezing[ ] ; Hemoptysis[ ] ; Sputum [ ] ; Snoring [ ]   GI: Vomiting[ ] ; Dysphagia[ ] ; Melena[ ] ; Hematochezia [ ] ; Heartburn[ ] ; Abdominal pain [ ] ; Constipation [ ] ; Diarrhea [ ] ; BRBPR [ ]   GU: Hematuria[ ] ; Dysuria [ ] ; Nocturia[ ]   Vascular: Pain in legs with walking [ ] ; Pain in feet with lying flat [ ] ; Non-healing sores [ ] ; Stroke [ ] ;  TIA [ ] ; Slurred speech [ ] ;  Neuro: Headaches[ ] ; Vertigo[ ] ; Seizures[ ] ; Paresthesias[ ] ;Blurred vision [ ] ; Diplopia [ ] ; Vision changes [ ]   Ortho/Skin: Arthritis [ ] ; Joint pain [ Y]; Muscle pain [ ] ; Joint swelling [ ] ; Back Pain [Y ]; Rash [ ]   Psych: Depression[ ] ; Anxiety[ ]   Heme: Bleeding problems [ ] ; Clotting disorders [ ] ; Anemia [ ]   Endocrine: Diabetes [ ] ; Thyroid dysfunction[ ]   Home Medications Prior to Admission medications   Medication Sig Start Date End Date Taking? Authorizing Provider  acetaminophen (TYLENOL) 500 MG tablet Take 1,000 mg by mouth every 6 (six) hours as needed for moderate pain or headache.   Yes [provider]  magnesium oxide (MAG-OX) 400 MG tablet TAKE 1 TABLET BY MOUTH ONCE DAILY. 01/17/21  Yes Satira Sark, MD  metolazone (ZAROXOLYN) 2.5 MG tablet Take 1 tablet (2.5 mg total) by mouth See admin instructions. 2.5 mg every M W F 06/28/21  Yes Barton Dubois, MD  omeprazole (PRILOSEC) 20 MG capsule Take 1 capsule (20 mg total) by mouth daily before breakfast. 07/22/21 01/18/22 Yes Jodi Mourning, Tivis Ringer, PA-C  potassium chloride SA (KLOR-CON) 20 MEQ tablet Take 3 tablets (60 mEq total) by mouth 3 (three) times daily. Take 2 additional Tablets on the Days That you take Metolazone 07/15/21  Yes Strader, [place] M, PA-C  spironolactone (ALDACTONE) 25 MG tablet Take 1 tablet (25 mg total) by  mouth daily. 06/28/21  Yes Barton Dubois, MD  TART CHERRY PO Take by mouth daily.   Yes [provider]  torsemide (DEMADEX) 20 MG tablet Take 4 tablets in am and 3 tablets in the evening. 06/28/21  Yes Barton Dubois, MD    Past Medical History: Past Medical History:  Diagnosis Date   Abnormal bilirubin test    Cholelithiasis    Chronic kidney disease, stage 3b (HCC)    Diastolic congestive heart failure (HCC)    Esophagitis    Essential hypertension    Fibroids    Gastritis    GERD (gastroesophageal reflux disease)    Gout    Liver  hemangioma    Morbid obesity ([place])    Normocytic anemia    OSA (obstructive sleep apnea)    Thyroid nodule    Tubular adenoma     Past Surgical History: Past Surgical History:  Procedure Laterality Date   BIOPSY  08/03/2020   Procedure: BIOPSY;  Surgeon: Eloise Harman, DO;  [facility];  Service: Endoscopy;;  duodenal gastric   CESAREAN SECTION     COLONOSCOPY WITH PROPOFOL N/A 08/03/2020    Surgeon: Hurshel Keys K, DO; 5 mm tubular adenoma in the sigmoid colon, nonbleeding internal hemorrhoids.  Recommended repeat in 5 years.   ESOPHAGOGASTRODUODENOSCOPY (EGD) WITH PROPOFOL N/A 08/03/2020   Surgeon: Eloise Harman, DO; LA grade C esophagitis without bleeding, gastritis with erythema and shallow ulcerations in gastric antrum biopsied (negative for H. pylori), normal examined duodenum s/p biopsy (benign).   HYSTERECTOMY ABDOMINAL WITH SALPINGECTOMY  2008   POLYPECTOMY  08/03/2020   Procedure: POLYPECTOMY;  Surgeon: Eloise Harman, DO;  [facility];  Service: Endoscopy;;  colon   THYROIDECTOMY Right 10/21/2018   Procedure: RIGHT HEMITHYROIDECTOMY;  Surgeon: Leta Baptist, MD;  Location: [place];  Service: ENT;  Laterality: Right;    Family History: Family History  Problem Relation Age of Onset   Breast cancer Mother    Other Paternal Grandfather        house fire   Other Maternal Grandmother        house fire   Congestive Heart Failure Maternal Grandfather    Colon cancer Father 39   Diverticulitis Brother    Diverticulitis Sister    Colon polyps Sister 17   Diabetes Daughter        borderline    Social History: Social History   Socioeconomic History   Marital status: Single    Spouse name: Not on file   Number of children: Not on file   Years of education: Not on file   Highest education level: Not on file  Occupational History   Not on file  Tobacco Use   Smoking status: Former    Packs/day: 0.50    Years: 20.00     Pack years: 10.00    Types: Cigarettes    Quit date: 2017    Years since quitting: 5.8   Smokeless tobacco: Never  Vaping Use   Vaping Use: Never used  Substance and Sexual Activity   Alcohol use: Not Currently   Drug use: Never   Sexual activity: Yes    Comment: hyst  Other Topics Concern   Not on file  Social History Narrative   Not on file   Social Determinants of Health   Financial Resource Strain: Not on file  Food Insecurity: Not on file  Transportation Needs: Not on file  Physical Activity: Not  on file  Stress: Not on file  Social Connections: Not on file    Allergies:  Allergies  Allergen Reactions   Oxycodone-Acetaminophen Hives and Other (See Comments)    Objective:    Vital Signs:   Temp:  [97.8 F (36.6 C)-98.3 F (36.8 C)] 98.3 F (36.8 C) (11/22 0400) Pulse Rate:  [70-93] 70 (11/22 0400) Resp:  [18] 18 (11/22 0400) BP: (90-119)/(65-83) 90/65 (11/22 0400) SpO2:  [96 %-100 %] 99 % (11/22 0400) Weight:  [142.4 kg] 142.4 kg (11/22 0400) Last BM Date: 07/22/21  Weight change: Filed Weights   07/24/21 0452 07/25/21 0452 07/26/21 0400  Weight: (!) 140.5 kg (!) 141.5 kg (!) 142.4 kg    Intake/Output:   Intake/Output Summary (Last 24 hours) at 07/26/2021 0855 Last data filed at 07/25/2021 1515 Gross per 24 hour  Intake 284.53 ml  Output --  Net 284.53 ml      Physical Exam    General:   No resp difficulty HEENT: normal Neck: supple. JVP difficult to assess due to body habitus. Carotids 2+ bilat; no bruits. No lymphadenopathy or thyromegaly appreciated. Cor: PMI nondisplaced. Regular rate & rhythm. No rubs, gallops or murmurs. Lungs: clear Abdomen: obese,  nontender, distended. No hepatosplenomegaly. No bruits or masses. Good bowel sounds. Extremities: no cyanosis, clubbing, rash, 3+ edema Neuro: alert & orientedx3, cranial nerves grossly intact. moves all 4 extremities w/o difficulty. Affect pleasant   Telemetry   SR 90s   EKG     SR 91 bpm low volts QRS  Labs   Basic Metabolic Panel: Recent Labs  Lab 07/22/21 1148 07/22/21 1153 07/22/21 1755 07/23/21 0551 07/24/21 0210 07/25/21 0123 07/26/21 0224  NA 136   < > 135 136 135 134* 133*  K 4.6   < > 4.9 5.0 4.3 4.6 4.2  CL 97*   < > 98 99 99 97* 96*  CO2 27   < > 25 26 26 26 24   GLUCOSE 92   < > 85 93 97 97 91  BUN 44*   < > 43* 46* 44* 49* 57*  CREATININE 2.76*   < > 2.76* 2.84* 3.21* 3.47* 3.58*  CALCIUM 9.6   < > 9.3 9.2 9.3 9.2 9.4  MG  --   --   --  2.0  --   --   --   PHOS 4.1  --   --  4.4  --   --   --    < > = values in this interval not displayed.    Liver Function Tests: Recent Labs  Lab 07/22/21 1144 07/22/21 1148 07/22/21 1153 07/22/21 1755 07/23/21 0551  AST 38  --  38 40 34  ALT 18  --  18 18 16   ALKPHOS 84  --  89 88 79  BILITOT 2.7*  --  2.7* 2.4* 2.5*  PROT 7.8  --  7.9 7.7 7.2  ALBUMIN 4.1 4.0 4.1 4.0 3.7   Recent Labs  Lab 07/22/21 1144 07/22/21 1755  LIPASE 41 46   No results for input(s): AMMONIA in the last 168 hours.  CBC: Recent Labs  Lab 07/22/21 1144 07/22/21 1153 07/22/21 1755 07/23/21 0551  WBC 6.0 6.2 7.0 5.8  NEUTROABS 4.4 4.5 5.2  --   HGB 11.9* 11.8* 11.9* 11.2*  HCT 37.0 36.5 37.1 35.1*  MCV 97.6 98.6 100.0 100.0  PLT 265 269 274 245    Cardiac Enzymes: No results for input(s): CKTOTAL, CKMB, CKMBINDEX, TROPONINI in the  last 168 hours.  BNP: BNP (last 3 results) Recent Labs    03/04/21 1222 06/23/21 1807 07/22/21 1755  BNP 543.0* 357.0* 510.0*    ProBNP (last 3 results) No results for input(s): PROBNP in the last 8760 hours.   CBG: No results for input(s): GLUCAP in the last 168 hours.  Coagulation Studies: No results for input(s): LABPROT, INR in the last 72 hours.   Imaging   NM Pulmonary Perfusion  Result Date: 07/25/2021 CLINICAL DATA:  Short of breath, history of tobacco abuse EXAM: NUCLEAR MEDICINE PERFUSION LUNG SCAN TECHNIQUE: Perfusion images were obtained  in multiple projections after intravenous injection of radiopharmaceutical. Ventilation scans intentionally deferred if perfusion scan and chest x-ray adequate for interpretation during COVID 19 epidemic. RADIOPHARMACEUTICALS:  4.4 mCi Tc-42m MAA IV COMPARISON:  07/25/2021 FINDINGS: Planar images of the chest are obtained during the perfusion examination. There are no wedge-shaped perfusion defects to suggest pulmonary embolus. Symmetrical distribution of radiotracer seen bilaterally. IMPRESSION: 1. No evidence of pulmonary embolus. Electronically Signed   By: Randa Ngo M.D.   On: 07/25/2021 15:50   DG Chest Port 1 View  Result Date: 07/25/2021 CLINICAL DATA:  Dyspnea EXAM: PORTABLE CHEST 1 VIEW COMPARISON:  07/22/2021 FINDINGS: Single frontal view of the chest demonstrates an unremarkable cardiac silhouette. No airspace disease, effusion, or pneumothorax. No acute bony abnormalities. IMPRESSION: 1. Stable exam, no acute process. Electronically Signed   By: Randa Ngo M.D.   On: 07/25/2021 15:49     Medications:     Current Medications:  cholecalciferol  1,000 Units Oral Daily   enoxaparin (LOVENOX) injection  40 mg Subcutaneous Daily   pantoprazole  40 mg Oral Daily   sodium chloride flush  3 mL Intravenous Q12H   sodium chloride flush  3 mL Intravenous Q12H    Infusions:  sodium chloride     sodium chloride        Patient Profile  Lauren Osborn is a 45 y.o. 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 Now with worsening AKI.   Assessment/Plan   A/C HFpEF -Echo this admit EF preserved and RV normal  -Will need RHC to further assess hemodynamics  - Off diuretics since 11/20. On exam, anasarca extending to abdomen - Give 80 mg IV  lasix now and start lasix drip at 15 mg per hour.  - Add unna boots  - Follow renal function. May need milrinone.   2. Hepatic Cirrhosis Noted abd [place] this admit. ? RV failure   3. AKI  Followed in the community by Dr Theador Hawthorne in [place].  -Creatinine baseline 1.9-2.3  -Nephrology following.  - Creatinine 2.8>3.2>3.5>3.6.  -RHC today   4. OSA Continue nightly CPAP   5. Obesity Body mass index is 55.6 kg/m. Dietitian following.   Length of Stay: 4  Amy Clegg, NP  07/26/2021, 8:55 AM  Advanced Heart Failure Team Pager [phone] (M-F; 7a - 5p)  Please contact [facility] for night-coverage after hours (4p -7a ) and weekends on [email or web address]  Patient seen with NP, agree with the above note.   History as noted above.  Patient was admitted with acute on chronic diastolic CHF, she also had a recent admit in 10/22 with the same.  She feels like she was not adequately diuresed in 10/22.  She has been off diuretics since  11/20 due to rise in creatinine, currently 3.58 (baseline 1.9-2.3).  No chronic or acute PE on V/Q scan and no DVT on lower extremity dopplers.   General: NAD, obese Neck: JVP 16+, no thyromegaly or thyroid nodule.  Lungs: Decreased at bases.  CV: Nondisplaced PMI.  Heart regular S1/S2, no S3/S4, no murmur.  2+ edema to torso.  No carotid bruit.  Normal pedal pulses.  Abdomen: Soft, nontender, no hepatosplenomegaly, mild distention.  Skin: Intact without lesions or rashes.  Neurologic: Alert and oriented x 3.  Psych: Normal affect. Extremities: No clubbing or cyanosis.  HEENT: Normal.   1. Acute on chronic diastolic CHF: With prominent RV dysfunction.  Echo from 11/22 reviewed, EF 60-65%, normal RV systolic function with mild dilation, D-shaped septum consistent with RV pressure/volume overload.  Patient is markedly volume overloaded with anasarca.  Unfortunately, creatinine is now up to 3.58.  She has not had diuretics since 11/20.  - Start Lasix 80 mg IV bolus  then gtt 15 mg/hr.  Hopefully, creatinine will stabilize/improve with fall in renal venous pressure by diuresis.  - RHC today to assess PA pressure and filling pressures.  Discussed risks/benefits with patient and she agrees to procedure.  - If cardiac output is marginal/low on RHC today, would consider milrinone for RV support.  2. AKI on CKD stage III: Baseline creatinine 1.9-2.3, now up to 3.58 in the setting of severe volume overload.  Suspect cardiorenal syndrome. As above, hopefully will stabilize/improve with diuresis and lowering of renal venous pressure.  3. Cirrhosis: Uncertain etiology, labs negative for viral hepatitis.  Not a heavy drinker.  May be due to NAFLD or RV failure.  4. OSA: Continue CPAP.   Loralie Champagne 07/26/2021 10:17 AM

## 2021-07-26 NOTE — Interval H&P Note (Signed)
History and Physical Interval Note:  07/26/2021 2:00 PM  Lauren Osborn  has presented today for surgery, with the diagnosis of heart failure.  The various methods of treatment have been discussed with the patient and family. After consideration of risks, benefits and other options for treatment, the patient has consented to  Procedure(s): RIGHT HEART CATH (N/A) as a surgical intervention.  The patient's history has been reviewed, patient examined, no change in status, stable for surgery.  I have reviewed the patient's chart and labs.  Questions were answered to the patient's satisfaction.     Cleo Villamizar [employer]

## 2021-07-26 NOTE — Progress Notes (Signed)
Mobility Specialist Progress Note    07/26/21 1613  Mobility  Activity Ambulated in hall  Level of Assistance Independent  Assistive Device None  Distance Ambulated (ft) 155 ft  Mobility Ambulated independently in hallway  Mobility Response Tolerated fair  Mobility performed by Mobility specialist  Bed Position Chair  $Mobility charge 1 Mobility   Pt received in chair and agreeable. No complaints on walk. Pt SOB during activity. Returned to chair with call bell in reach and RN present.    [facility] Mobility Specialist  M.S. Primary Phone: [phone] M.S. Secondary Phone: [phone]

## 2021-07-26 NOTE — Progress Notes (Signed)
Patient ID: Lauren Osborn, female   DOB: [birth year], 45 y.o.   MRN: [id number] [facility] Progress Note   Assessment/ Plan:   1. Acute kidney Injury on chronic kidney disease stage IIIb (baseline creatinine 1.9-2.3): Appears hemodynamically mediated in the setting of acute exacerbation of diastolic heart failure with worsening from ongoing diuresis.  It is plausible that intra-abdominal hypertension associated with her ascites might be contributing to worsening renal function.  Overnight essentially unchanged creatinine and awaiting right heart catheterization today to help decide on additional management. 2.  Hyponatremia: Secondary to CHF exacerbation/impaired free water deficit and inappropriate ADH activation-continue fluid restriction and will decide on restarting loop diuretics as renal function improves. 3.  Acute exacerbation of diastolic heart failure: Diuretics on hold secondary to worsening renal insufficiency, continue fluid restriction at 1.5 L a day.  VQ scan negative for acute PE yesterday and awaiting right heart catheterization. 4.  Anemia: Hemoglobin and hematocrit slightly lower-unclear if this is dilutional from CHF exacerbation, no indication for ESA.  Subjective:   Without acute events overnight, denies any chest pain and still having discomfort from abdominal/leg swelling.   Objective:   BP 90/65 (BP Location: Right Arm)   Pulse 70   Temp 98.3 F (36.8 C) (Oral)   Resp 18   Ht 5\' 3"  (1.6 m)   Wt (!) 142.4 kg   SpO2 99%   BMI 55.60 kg/m   Intake/Output Summary (Last 24 hours) at 07/26/2021 6789 Last data filed at 07/25/2021 1515 Gross per 24 hour  Intake 284.53 ml  Output --  Net 284.53 ml   Weight change: 0.862 kg  Physical Exam: Gen: Comfortably sitting up in recliner, brother at bedside CVS: Pulse regular rhythm, normal rate, S1 and S2 normal Resp: Diminished breath sounds over bases, no distinct rales or rhonchi Abd: Soft, obese,  nontender Ext: 2+ pitting edema over lower extremities  Imaging: NM Pulmonary Perfusion  Result Date: 07/25/2021 CLINICAL DATA:  Short of breath, history of tobacco abuse EXAM: NUCLEAR MEDICINE PERFUSION LUNG SCAN TECHNIQUE: Perfusion images were obtained in multiple projections after intravenous injection of radiopharmaceutical. Ventilation scans intentionally deferred if perfusion scan and chest x-ray adequate for interpretation during COVID 19 epidemic. RADIOPHARMACEUTICALS:  4.4 mCi Tc-70m MAA IV COMPARISON:  07/25/2021 FINDINGS: Planar images of the chest are obtained during the perfusion examination. There are no wedge-shaped perfusion defects to suggest pulmonary embolus. Symmetrical distribution of radiotracer seen bilaterally. IMPRESSION: 1. No evidence of pulmonary embolus. Electronically Signed   By: Randa Ngo M.D.   On: 07/25/2021 15:50   DG Chest Port 1 View  Result Date: 07/25/2021 CLINICAL DATA:  Dyspnea EXAM: PORTABLE CHEST 1 VIEW COMPARISON:  07/22/2021 FINDINGS: Single frontal view of the chest demonstrates an unremarkable cardiac silhouette. No airspace disease, effusion, or pneumothorax. No acute bony abnormalities. IMPRESSION: 1. Stable exam, no acute process. Electronically Signed   By: Randa Ngo M.D.   On: 07/25/2021 15:49    Labs: BMET Recent Labs  Lab 07/22/21 1148 07/22/21 1153 07/22/21 1755 07/23/21 0551 07/24/21 0210 07/25/21 0123 07/26/21 0224  NA 136 135 135 136 135 134* 133*  K 4.6 4.6 4.9 5.0 4.3 4.6 4.2  CL 97* 96* 98 99 99 97* 96*  CO2 27 26 25 26 26 26 24   GLUCOSE 92 91 85 93 97 97 91  BUN 44* 44* 43* 46* 44* 49* 57*  CREATININE 2.76* 2.76* 2.76* 2.84* 3.21* 3.47* 3.58*  CALCIUM 9.6 9.5 9.3 9.2 9.3 9.2  9.4  PHOS 4.1  --   --  4.4  --   --   --    CBC Recent Labs  Lab 07/22/21 1144 07/22/21 1153 07/22/21 1755 07/23/21 0551  WBC 6.0 6.2 7.0 5.8  NEUTROABS 4.4 4.5 5.2  --   HGB 11.9* 11.8* 11.9* 11.2*  HCT 37.0 36.5 37.1 35.1*   MCV 97.6 98.6 100.0 100.0  PLT 265 269 274 245    Medications:     cholecalciferol  1,000 Units Oral Daily   enoxaparin (LOVENOX) injection  40 mg Subcutaneous Daily   pantoprazole  40 mg Oral Daily   sodium chloride flush  3 mL Intravenous Q12H   sodium chloride flush  3 mL Intravenous Q12H   Elmarie Shiley, MD 07/26/2021, 8:17 AM

## 2021-07-26 NOTE — H&P (View-Only) (Signed)
Advanced Heart Failure Team Consult Note   Primary Physician: [name], MD PCP-Cardiologist:  Rozann Lesches, MD  Reason for Consultation: Heart Failure   HPI:    Lauren Osborn is seen today for evaluation of HFpEF at the request of Dr Harrington Challenger.    Lauren Osborn is a 45 y.o. 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   Admitted 06/25/21 with volume overload. Diuresed with IV lasix and transitioned to torsemide 80 mg/60 mg + metolazone 3 times a week. D/c weight 308 pounds which was unchanged from admit.   Weight has been over 300 pounds for at least a year. Having ongoing lower edema. Uses motorized scooter in [place]. Works as Presenter, broadcasting.   Presented to [facility] with RUQ and increased dyspnea. CXR no acute process. SARS 2 negative. BNP 510. Diuresing with IV lasix but stopped on 11/20 due to elevated creatinine.  VQ - negative PE. Lower extremity doppler negative for DVT. Echo repeat EF remains preserved. Worsening renal function. Creatinine 2.8>3.2>3.5>3.6.   Review of Systems: [y] = yes, [ ]  = no   General: Weight gain [ Y]; Weight loss [ ] ; Anorexia [ ] ; Fatigue [ Y]; Fever [ ] ; Chills [ ] ; Weakness [ ]   Cardiac: Chest pain/pressure [ ] ; Resting SOB [ ] ; Exertional SOB [ Y]; Orthopnea [ ] ; Pedal Edema [ Y]; Palpitations [ ] ; Syncope [ ] ; Presyncope [ ] ; Paroxysmal nocturnal dyspnea[ ]   Pulmonary: Cough [ ] ; Wheezing[ ] ; Hemoptysis[ ] ; Sputum [ ] ; Snoring [ ]   GI: Vomiting[ ] ; Dysphagia[ ] ; Melena[ ] ; Hematochezia [ ] ; Heartburn[ ] ; Abdominal pain [ ] ; Constipation [ ] ; Diarrhea [ ] ; BRBPR [ ]   GU: Hematuria[ ] ; Dysuria [ ] ; Nocturia[ ]   Vascular: Pain in legs with walking [ ] ; Pain in feet with lying flat [ ] ; Non-healing sores [ ] ; Stroke [ ] ;  TIA [ ] ; Slurred speech [ ] ;  Neuro: Headaches[ ] ; Vertigo[ ] ; Seizures[ ] ; Paresthesias[ ] ;Blurred vision [ ] ; Diplopia [ ] ; Vision changes [ ]   Ortho/Skin: Arthritis [ ] ; Joint pain [ Y]; Muscle pain [ ] ; Joint swelling [ ] ; Back Pain [Y ]; Rash [ ]   Psych: Depression[ ] ; Anxiety[ ]   Heme: Bleeding problems [ ] ; Clotting disorders [ ] ; Anemia [ ]   Endocrine: Diabetes [ ] ; Thyroid dysfunction[ ]   Home Medications Prior to Admission medications   Medication Sig Start Date End Date Taking? Authorizing Provider  acetaminophen (TYLENOL) 500 MG tablet Take 1,000 mg by mouth every 6 (six) hours as needed for moderate pain or headache.   Yes [provider]  magnesium oxide (MAG-OX) 400 MG tablet TAKE 1 TABLET BY MOUTH ONCE DAILY. 01/17/21  Yes Satira Sark, MD  metolazone (ZAROXOLYN) 2.5 MG tablet Take 1 tablet (2.5 mg total) by mouth See admin instructions. 2.5 mg every M W F 06/28/21  Yes Barton Dubois, MD  omeprazole (PRILOSEC) 20 MG capsule Take 1 capsule (20 mg total) by mouth daily before breakfast. 07/22/21 01/18/22 Yes Jodi Mourning, Tivis Ringer, PA-C  potassium chloride SA (KLOR-CON) 20 MEQ tablet Take 3 tablets (60 mEq total) by mouth 3 (three) times daily. Take 2 additional Tablets on the Days That you take Metolazone 07/15/21  Yes Strader, [place] M, PA-C  spironolactone (ALDACTONE) 25 MG tablet Take 1 tablet (25 mg total) by  mouth daily. 06/28/21  Yes Barton Dubois, MD  TART CHERRY PO Take by mouth daily.   Yes [provider]  torsemide (DEMADEX) 20 MG tablet Take 4 tablets in am and 3 tablets in the evening. 06/28/21  Yes Barton Dubois, MD    Past Medical History: Past Medical History:  Diagnosis Date   Abnormal bilirubin test    Cholelithiasis    Chronic kidney disease, stage 3b (HCC)    Diastolic congestive heart failure (HCC)    Esophagitis    Essential hypertension    Fibroids    Gastritis    GERD (gastroesophageal reflux disease)    Gout    Liver  hemangioma    Morbid obesity ([place])    Normocytic anemia    OSA (obstructive sleep apnea)    Thyroid nodule    Tubular adenoma     Past Surgical History: Past Surgical History:  Procedure Laterality Date   BIOPSY  08/03/2020   Procedure: BIOPSY;  Surgeon: Eloise Harman, DO;  [facility];  Service: Endoscopy;;  duodenal gastric   CESAREAN SECTION     COLONOSCOPY WITH PROPOFOL N/A 08/03/2020    Surgeon: Hurshel Keys K, DO; 5 mm tubular adenoma in the sigmoid colon, nonbleeding internal hemorrhoids.  Recommended repeat in 5 years.   ESOPHAGOGASTRODUODENOSCOPY (EGD) WITH PROPOFOL N/A 08/03/2020   Surgeon: Eloise Harman, DO; LA grade C esophagitis without bleeding, gastritis with erythema and shallow ulcerations in gastric antrum biopsied (negative for H. pylori), normal examined duodenum s/p biopsy (benign).   HYSTERECTOMY ABDOMINAL WITH SALPINGECTOMY  2008   POLYPECTOMY  08/03/2020   Procedure: POLYPECTOMY;  Surgeon: Eloise Harman, DO;  [facility];  Service: Endoscopy;;  colon   THYROIDECTOMY Right 10/21/2018   Procedure: RIGHT HEMITHYROIDECTOMY;  Surgeon: Leta Baptist, MD;  Location: [place];  Service: ENT;  Laterality: Right;    Family History: Family History  Problem Relation Age of Onset   Breast cancer Mother    Other Paternal Grandfather        house fire   Other Maternal Grandmother        house fire   Congestive Heart Failure Maternal Grandfather    Colon cancer Father 69   Diverticulitis Brother    Diverticulitis Sister    Colon polyps Sister 33   Diabetes Daughter        borderline    Social History: Social History   Socioeconomic History   Marital status: Single    Spouse name: Not on file   Number of children: Not on file   Years of education: Not on file   Highest education level: Not on file  Occupational History   Not on file  Tobacco Use   Smoking status: Former    Packs/day: 0.50    Years: 20.00     Pack years: 10.00    Types: Cigarettes    Quit date: 2017    Years since quitting: 5.8   Smokeless tobacco: Never  Vaping Use   Vaping Use: Never used  Substance and Sexual Activity   Alcohol use: Not Currently   Drug use: Never   Sexual activity: Yes    Comment: hyst  Other Topics Concern   Not on file  Social History Narrative   Not on file   Social Determinants of Health   Financial Resource Strain: Not on file  Food Insecurity: Not on file  Transportation Needs: Not on file  Physical Activity: Not  on file  Stress: Not on file  Social Connections: Not on file    Allergies:  Allergies  Allergen Reactions   Oxycodone-Acetaminophen Hives and Other (See Comments)    Objective:    Vital Signs:   Temp:  [97.8 F (36.6 C)-98.3 F (36.8 C)] 98.3 F (36.8 C) (11/22 0400) Pulse Rate:  [70-93] 70 (11/22 0400) Resp:  [18] 18 (11/22 0400) BP: (90-119)/(65-83) 90/65 (11/22 0400) SpO2:  [96 %-100 %] 99 % (11/22 0400) Weight:  [142.4 kg] 142.4 kg (11/22 0400) Last BM Date: 07/22/21  Weight change: Filed Weights   07/24/21 0452 07/25/21 0452 07/26/21 0400  Weight: (!) 140.5 kg (!) 141.5 kg (!) 142.4 kg    Intake/Output:   Intake/Output Summary (Last 24 hours) at 07/26/2021 0855 Last data filed at 07/25/2021 1515 Gross per 24 hour  Intake 284.53 ml  Output --  Net 284.53 ml      Physical Exam    General:   No resp difficulty HEENT: normal Neck: supple. JVP difficult to assess due to body habitus. Carotids 2+ bilat; no bruits. No lymphadenopathy or thyromegaly appreciated. Cor: PMI nondisplaced. Regular rate & rhythm. No rubs, gallops or murmurs. Lungs: clear Abdomen: obese,  nontender, distended. No hepatosplenomegaly. No bruits or masses. Good bowel sounds. Extremities: no cyanosis, clubbing, rash, 3+ edema Neuro: alert & orientedx3, cranial nerves grossly intact. moves all 4 extremities w/o difficulty. Affect pleasant   Telemetry   SR 90s   EKG     SR 91 bpm low volts QRS  Labs   Basic Metabolic Panel: Recent Labs  Lab 07/22/21 1148 07/22/21 1153 07/22/21 1755 07/23/21 0551 07/24/21 0210 07/25/21 0123 07/26/21 0224  NA 136   < > 135 136 135 134* 133*  K 4.6   < > 4.9 5.0 4.3 4.6 4.2  CL 97*   < > 98 99 99 97* 96*  CO2 27   < > 25 26 26 26 24   GLUCOSE 92   < > 85 93 97 97 91  BUN 44*   < > 43* 46* 44* 49* 57*  CREATININE 2.76*   < > 2.76* 2.84* 3.21* 3.47* 3.58*  CALCIUM 9.6   < > 9.3 9.2 9.3 9.2 9.4  MG  --   --   --  2.0  --   --   --   PHOS 4.1  --   --  4.4  --   --   --    < > = values in this interval not displayed.    Liver Function Tests: Recent Labs  Lab 07/22/21 1144 07/22/21 1148 07/22/21 1153 07/22/21 1755 07/23/21 0551  AST 38  --  38 40 34  ALT 18  --  18 18 16   ALKPHOS 84  --  89 88 79  BILITOT 2.7*  --  2.7* 2.4* 2.5*  PROT 7.8  --  7.9 7.7 7.2  ALBUMIN 4.1 4.0 4.1 4.0 3.7   Recent Labs  Lab 07/22/21 1144 07/22/21 1755  LIPASE 41 46   No results for input(s): AMMONIA in the last 168 hours.  CBC: Recent Labs  Lab 07/22/21 1144 07/22/21 1153 07/22/21 1755 07/23/21 0551  WBC 6.0 6.2 7.0 5.8  NEUTROABS 4.4 4.5 5.2  --   HGB 11.9* 11.8* 11.9* 11.2*  HCT 37.0 36.5 37.1 35.1*  MCV 97.6 98.6 100.0 100.0  PLT 265 269 274 245    Cardiac Enzymes: No results for input(s): CKTOTAL, CKMB, CKMBINDEX, TROPONINI in the  last 168 hours.  BNP: BNP (last 3 results) Recent Labs    03/04/21 1222 06/23/21 1807 07/22/21 1755  BNP 543.0* 357.0* 510.0*    ProBNP (last 3 results) No results for input(s): PROBNP in the last 8760 hours.   CBG: No results for input(s): GLUCAP in the last 168 hours.  Coagulation Studies: No results for input(s): LABPROT, INR in the last 72 hours.   Imaging   NM Pulmonary Perfusion  Result Date: 07/25/2021 CLINICAL DATA:  Short of breath, history of tobacco abuse EXAM: NUCLEAR MEDICINE PERFUSION LUNG SCAN TECHNIQUE: Perfusion images were obtained  in multiple projections after intravenous injection of radiopharmaceutical. Ventilation scans intentionally deferred if perfusion scan and chest x-ray adequate for interpretation during COVID 19 epidemic. RADIOPHARMACEUTICALS:  4.4 mCi Tc-74m MAA IV COMPARISON:  07/25/2021 FINDINGS: Planar images of the chest are obtained during the perfusion examination. There are no wedge-shaped perfusion defects to suggest pulmonary embolus. Symmetrical distribution of radiotracer seen bilaterally. IMPRESSION: 1. No evidence of pulmonary embolus. Electronically Signed   By: Randa Ngo M.D.   On: 07/25/2021 15:50   DG Chest Port 1 View  Result Date: 07/25/2021 CLINICAL DATA:  Dyspnea EXAM: PORTABLE CHEST 1 VIEW COMPARISON:  07/22/2021 FINDINGS: Single frontal view of the chest demonstrates an unremarkable cardiac silhouette. No airspace disease, effusion, or pneumothorax. No acute bony abnormalities. IMPRESSION: 1. Stable exam, no acute process. Electronically Signed   By: Randa Ngo M.D.   On: 07/25/2021 15:49     Medications:     Current Medications:  cholecalciferol  1,000 Units Oral Daily   enoxaparin (LOVENOX) injection  40 mg Subcutaneous Daily   pantoprazole  40 mg Oral Daily   sodium chloride flush  3 mL Intravenous Q12H   sodium chloride flush  3 mL Intravenous Q12H    Infusions:  sodium chloride     sodium chloride        Patient Profile  Lauren Osborn is a 45 y.o. 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 Now with worsening AKI.   Assessment/Plan   A/C HFpEF -Echo this admit EF preserved and RV normal  -Will need RHC to further assess hemodynamics  - Off diuretics since 11/20. On exam, anasarca extending to abdomen - Give 80 mg IV  lasix now and start lasix drip at 15 mg per hour.  - Add unna boots  - Follow renal function. May need milrinone.   2. Hepatic Cirrhosis Noted abd [place] this admit. ? RV failure   3. AKI  Followed in the community by Dr Theador Hawthorne in [place].  -Creatinine baseline 1.9-2.3  -Nephrology following.  - Creatinine 2.8>3.2>3.5>3.6.  -RHC today   4. OSA Continue nightly CPAP   5. Obesity Body mass index is 55.6 kg/m. Dietitian following.   Length of Stay: 4  Amy Clegg, NP  07/26/2021, 8:55 AM  Advanced Heart Failure Team Pager [phone] (M-F; 7a - 5p)  Please contact [facility] for night-coverage after hours (4p -7a ) and weekends on [email or web address]  Patient seen with NP, agree with the above note.   History as noted above.  Patient was admitted with acute on chronic diastolic CHF, she also had a recent admit in 10/22 with the same.  She feels like she was not adequately diuresed in 10/22.  She has been off diuretics since  11/20 due to rise in creatinine, currently 3.58 (baseline 1.9-2.3).  No chronic or acute PE on V/Q scan and no DVT on lower extremity dopplers.   General: NAD, obese Neck: JVP 16+, no thyromegaly or thyroid nodule.  Lungs: Decreased at bases.  CV: Nondisplaced PMI.  Heart regular S1/S2, no S3/S4, no murmur.  2+ edema to torso.  No carotid bruit.  Normal pedal pulses.  Abdomen: Soft, nontender, no hepatosplenomegaly, mild distention.  Skin: Intact without lesions or rashes.  Neurologic: Alert and oriented x 3.  Psych: Normal affect. Extremities: No clubbing or cyanosis.  HEENT: Normal.   1. Acute on chronic diastolic CHF: With prominent RV dysfunction.  Echo from 11/22 reviewed, EF 60-65%, normal RV systolic function with mild dilation, D-shaped septum consistent with RV pressure/volume overload.  Patient is markedly volume overloaded with anasarca.  Unfortunately, creatinine is now up to 3.58.  She has not had diuretics since 11/20.  - Start Lasix 80 mg IV bolus  then gtt 15 mg/hr.  Hopefully, creatinine will stabilize/improve with fall in renal venous pressure by diuresis.  - RHC today to assess PA pressure and filling pressures.  Discussed risks/benefits with patient and she agrees to procedure.  - If cardiac output is marginal/low on RHC today, would consider milrinone for RV support.  2. AKI on CKD stage III: Baseline creatinine 1.9-2.3, now up to 3.58 in the setting of severe volume overload.  Suspect cardiorenal syndrome. As above, hopefully will stabilize/improve with diuresis and lowering of renal venous pressure.  3. Cirrhosis: Uncertain etiology, labs negative for viral hepatitis.  Not a heavy drinker.  May be due to NAFLD or RV failure.  4. OSA: Continue CPAP.   Loralie Champagne 07/26/2021 10:17 AM

## 2021-07-27 ENCOUNTER — Encounter (HOSPITAL_COMMUNITY): Payer: Self-pay | Admitting: Cardiology

## 2021-07-27 DIAGNOSIS — N179 Acute kidney failure, unspecified: Secondary | ICD-10-CM

## 2021-07-27 DIAGNOSIS — N183 Chronic kidney disease, stage 3 unspecified: Secondary | ICD-10-CM | POA: Diagnosis not present

## 2021-07-27 DIAGNOSIS — K7460 Unspecified cirrhosis of liver: Secondary | ICD-10-CM | POA: Diagnosis not present

## 2021-07-27 DIAGNOSIS — R601 Generalized edema: Secondary | ICD-10-CM | POA: Diagnosis not present

## 2021-07-27 DIAGNOSIS — I5033 Acute on chronic diastolic (congestive) heart failure: Secondary | ICD-10-CM | POA: Diagnosis not present

## 2021-07-27 LAB — BASIC METABOLIC PANEL
Anion gap: 14 (ref 5–15)
BUN: 60 mg/dL — ABNORMAL HIGH (ref 6–20)
CO2: 23 mmol/L (ref 22–32)
Calcium: 9.3 mg/dL (ref 8.9–10.3)
Chloride: 96 mmol/L — ABNORMAL LOW (ref 98–111)
Creatinine, Ser: 3.68 mg/dL — ABNORMAL HIGH (ref 0.44–1.00)
GFR, Estimated: 15 mL/min — ABNORMAL LOW (ref >60)
Glucose, Bld: 93 mg/dL (ref 70–99)
Potassium: 3.6 mmol/L (ref 3.5–5.1)
Sodium: 133 mmol/L — ABNORMAL LOW (ref 135–145)

## 2021-07-27 LAB — MAGNESIUM: Magnesium: 2.1 mg/dL (ref 1.7–2.4)

## 2021-07-27 LAB — T3: T3, Total: 144 ng/dL (ref 71–180)

## 2021-07-27 MED ORDER — METOLAZONE 5 MG PO TABS
5.0000 mg | ORAL_TABLET | Freq: Two times a day (BID) | ORAL | Status: AC
  Administered 2021-07-27 (×2): 5 mg via ORAL
  Filled 2021-07-27 (×2): qty 1

## 2021-07-27 MED ORDER — MILRINONE LACTATE IN DEXTROSE 20-5 MG/100ML-% IV SOLN
0.1250 ug/kg/min | INTRAVENOUS | Status: DC
  Administered 2021-07-27 – 2021-08-02 (×9): 0.125 ug/kg/min via INTRAVENOUS
  Filled 2021-07-27 (×9): qty 100

## 2021-07-27 MED ORDER — POTASSIUM CHLORIDE CRYS ER 20 MEQ PO TBCR
20.0000 meq | EXTENDED_RELEASE_TABLET | Freq: Once | ORAL | Status: AC
  Administered 2021-07-27: 20 meq via ORAL
  Filled 2021-07-27: qty 1

## 2021-07-27 MED ORDER — MIDODRINE HCL 5 MG PO TABS
5.0000 mg | ORAL_TABLET | Freq: Three times a day (TID) | ORAL | Status: DC
  Administered 2021-07-27 – 2021-08-01 (×17): 5 mg via ORAL
  Filled 2021-07-27 (×20): qty 1

## 2021-07-27 MED FILL — Heparin Sod (Porcine)-NaCl IV Soln 1000 Unit/500ML-0.9%: INTRAVENOUS | Qty: 500 | Status: AC

## 2021-07-27 NOTE — Progress Notes (Signed)
Patient ID: Lauren Osborn, female   DOB: 08/25/1976, 45 y.o.   MRN: [id number] [facility] Progress Note   Assessment/ Plan:   1. Acute kidney Injury on chronic kidney disease stage IIIb (baseline creatinine 1.9-2.3): Appears hemodynamically mediated in the setting of acute exacerbation of diastolic heart failure with worsening from ongoing diuresis.  Creatinine elevated and essentially unchanged overnight and response to starting diuretics-we will continue to follow labs to decide on additional management and if urine output is <3L overnight, would recommend adding metolazone to augment diuresis.  She does not have indications for dialysis at this time. 2.  Hyponatremia: Secondary to CHF exacerbation/impaired free water deficit and inappropriate ADH activation-continue fluid restriction and started back on furosemide. 3.  Acute exacerbation of diastolic heart failure: Diuretics transiently held secondary to worsening renal insufficiency, continue fluid restriction at 1.5 L a day.  VQ scan negative for PE and right heart catheterization shows markedly elevated left and right heart filling pressures with moderate pulmonary venous hypertension and preserved cardiac output.  She has been started on Lasix drip following bolus and agree with continuing the dose at 15 mg/h with addition of metolazone if urine output over the next 24 hours remains <3 L. 4.  Anemia: Hemoglobin and hematocrit slightly lower-unclear if this is dilutional from CHF exacerbation, no indication for ESA.  Subjective:   Denies any acute complaints overnight and tolerated right heart catheterization without problems.  Objective:   BP 102/75 (BP Location: Left Arm)   Pulse 78   Temp 97.9 F (36.6 C) (Oral)   Resp 18   Ht 5\' 3"  (1.6 m)   Wt (!) 143.2 kg   SpO2 93%   BMI 55.91 kg/m   Intake/Output Summary (Last 24 hours) at 07/27/2021 0816 Last data filed at 07/27/2021 0600 Gross per 24 hour  Intake 301.68 ml   Output 1425 ml  Net -1123.32 ml   Weight change: 0.771 kg  Physical Exam: Gen: Appears comfortable resting flat in bed, on Lasix drip. CVS: Pulse regular rhythm, normal rate, S1 and S2 normal Resp: Distant breath sounds bilaterally over anterior chest, no distinct rales or rhonchi Abd: Soft, obese, nontender Ext: 2+ pitting edema over lower extremities  Imaging: NM Pulmonary Perfusion  Result Date: 07/25/2021 CLINICAL DATA:  Short of breath, history of tobacco abuse EXAM: NUCLEAR MEDICINE PERFUSION LUNG SCAN TECHNIQUE: Perfusion images were obtained in multiple projections after intravenous injection of radiopharmaceutical. Ventilation scans intentionally deferred if perfusion scan and chest x-ray adequate for interpretation during COVID 19 epidemic. RADIOPHARMACEUTICALS:  4.4 mCi Tc-62m MAA IV COMPARISON:  07/25/2021 FINDINGS: Planar images of the chest are obtained during the perfusion examination. There are no wedge-shaped perfusion defects to suggest pulmonary embolus. Symmetrical distribution of radiotracer seen bilaterally. IMPRESSION: 1. No evidence of pulmonary embolus. Electronically Signed   By: Randa Ngo M.D.   On: 07/25/2021 15:50   CARDIAC CATHETERIZATION  Result Date: 07/26/2021 1. Markedly elevated left and right heart filling pressures. 2. Moderate pulmonary venous hypertension. 3. Preserved cardiac output.   DG Chest Port 1 View  Result Date: 07/25/2021 CLINICAL DATA:  Dyspnea EXAM: PORTABLE CHEST 1 VIEW COMPARISON:  07/22/2021 FINDINGS: Single frontal view of the chest demonstrates an unremarkable cardiac silhouette. No airspace disease, effusion, or pneumothorax. No acute bony abnormalities. IMPRESSION: 1. Stable exam, no acute process. Electronically Signed   By: Randa Ngo M.D.   On: 07/25/2021 15:49    Labs: BMET Recent Labs  Lab 07/22/21 1148 07/22/21 1153  07/22/21 1755 07/23/21 0551 07/24/21 0210 07/25/21 0123 07/26/21 0224 07/26/21 1317  07/26/21 1413 07/27/21 0226  NA 136   < > 135 136 135 134* 133* 132* 135  135 133*  K 4.6   < > 4.9 5.0 4.3 4.6 4.2 4.1 4.0  3.9 3.6  CL 97*   < > 98 99 99 97* 96* 95*  --  96*  CO2 27   < > 25 26 26 26 24 24   --  23  GLUCOSE 92   < > 85 93 97 97 91 87  --  93  BUN 44*   < > 43* 46* 44* 49* 57* 58*  --  60*  CREATININE 2.76*   < > 2.76* 2.84* 3.21* 3.47* 3.58* 3.62*  --  3.68*  CALCIUM 9.6   < > 9.3 9.2 9.3 9.2 9.4 9.3  --  9.3  PHOS 4.1  --   --  4.4  --   --   --   --   --   --    < > = values in this interval not displayed.   CBC Recent Labs  Lab 07/22/21 1144 07/22/21 1153 07/22/21 1755 07/23/21 0551 07/26/21 1413  WBC 6.0 6.2 7.0 5.8  --   NEUTROABS 4.4 4.5 5.2  --   --   HGB 11.9* 11.8* 11.9* 11.2* 11.2*  11.2*  HCT 37.0 36.5 37.1 35.1* 33.0*  33.0*  MCV 97.6 98.6 100.0 100.0  --   PLT 265 269 274 245  --     Medications:     cholecalciferol  1,000 Units Oral Daily   enoxaparin (LOVENOX) injection  40 mg Subcutaneous Daily   pantoprazole  40 mg Oral Daily   sodium chloride flush  3 mL Intravenous Q12H   Elmarie Shiley, MD 07/27/2021, 8:16 AM

## 2021-07-27 NOTE — Progress Notes (Addendum)
Advanced Heart Failure Rounding Note  PCP-Cardiologist: Rozann Lesches, MD   Subjective:    [place] 11/22 w/ markedly elevated left and right filling pressure, mod pulmonary venous HTN and preserved CO   Now on Lasix gtt 15/hr. Only 1.4L in UOP charted yesterday. Wt up 2 lb.   SCr unchanged, 3.58>>3.62>>3.68  K 3.6   No resting dyspnea. SOB w/ activity.   [place] 07/26/21  Right Heart Pressures RHC Procedural Findings: Hemodynamics (mmHg) RA mean 35 RV 57/28, 35 PA 58/29, mean 46 PCWP mean 42  Oxygen saturations: PA 70% AO 99%  Cardiac Output (Fick) 7.04  Cardiac Index (Fick) 3.01 PVR < 1 WU PAPI < 1    Objective:   Weight Range: (!) 143.2 kg Body mass index is 55.91 kg/m.   Vital Signs:   Temp:  [97.6 F (36.4 C)-97.9 F (36.6 C)] 97.9 F (36.6 C) (11/23 0540) Pulse Rate:  [76-90] 78 (11/22 2312) Resp:  [13-37] 18 (11/23 0540) BP: (100-124)/(74-88) 102/75 (11/23 0540) SpO2:  [93 %-100 %] 93 % (11/23 0540) Weight:  [143.2 kg] 143.2 kg (11/23 0540) Last BM Date: 07/26/21  Weight change: Filed Weights   07/25/21 0452 07/26/21 0400 07/27/21 0540  Weight: (!) 141.5 kg (!) 142.4 kg (!) 143.2 kg    Intake/Output:   Intake/Output Summary (Last 24 hours) at 07/27/2021 0811 Last data filed at 07/27/2021 0600 Gross per 24 hour  Intake 301.68 ml  Output 1425 ml  Net -1123.32 ml      Physical Exam    General:  obese. No resp difficulty HEENT: Normal Neck: Supple. JVP elevated to ear . Carotids 2+ bilat; no bruits. No lymphadenopathy or thyromegaly appreciated. Cor: PMI nondisplaced. Regular rate & rhythm. No rubs, gallops or murmurs. Lungs: Clear Abdomen: Soft, nontender, nondistended. No hepatosplenomegaly. No bruits or masses. Good bowel sounds. Extremities: No cyanosis, clubbing, rash, 3+ bilateral LE edema + unna boots  Neuro: Alert & orientedx3, cranial nerves grossly intact. moves all 4 extremities w/o difficulty. Affect pleasant   Telemetry    NSR 70s   EKG    No new EKG to review   Labs    CBC Recent Labs    07/26/21 1413  HGB 11.2*  11.2*  HCT 33.0*  95.0*   Basic Metabolic Panel Recent Labs    07/26/21 1317 07/26/21 1413 07/27/21 0226  NA 132* 135  135 133*  K 4.1 4.0  3.9 3.6  CL 95*  --  96*  CO2 24  --  23  GLUCOSE 87  --  93  BUN 58*  --  60*  CREATININE 3.62*  --  3.68*  CALCIUM 9.3  --  9.3  MG 2.1  --  2.1   Liver Function Tests No results for input(s): AST, ALT, ALKPHOS, BILITOT, PROT, ALBUMIN in the last 72 hours. No results for input(s): LIPASE, AMYLASE in the last 72 hours. Cardiac Enzymes No results for input(s): CKTOTAL, CKMB, CKMBINDEX, TROPONINI in the last 72 hours.  BNP: BNP (last 3 results) Recent Labs    03/04/21 1222 06/23/21 1807 07/22/21 1755  BNP 543.0* 357.0* 510.0*    ProBNP (last 3 results) No results for input(s): PROBNP in the last 8760 hours.   D-Dimer No results for input(s): DDIMER in the last 72 hours. Hemoglobin A1C No results for input(s): HGBA1C in the last 72 hours. Fasting Lipid Panel No results for input(s): CHOL, HDL, LDLCALC, TRIG, CHOLHDL, LDLDIRECT in the last 72 hours. Thyroid Function Tests No  results for input(s): TSH, T4TOTAL, T3FREE, THYROIDAB in the last 72 hours.  Invalid input(s): FREET3  Other results:   Imaging    CARDIAC CATHETERIZATION  Result Date: 07/26/2021 1. Markedly elevated left and right heart filling pressures. 2. Moderate pulmonary venous hypertension. 3. Preserved cardiac output.     Medications:     Scheduled Medications:  cholecalciferol  1,000 Units Oral Daily   enoxaparin (LOVENOX) injection  40 mg Subcutaneous Daily   pantoprazole  40 mg Oral Daily   sodium chloride flush  3 mL Intravenous Q12H    Infusions:  furosemide (LASIX) 200 mg in dextrose 5% 100 mL (2mg /mL) infusion 15 mg/hr (07/27/21 0148)    PRN Medications: camphor-menthol, HYDROmorphone (DILAUDID)  injection    Assessment/Plan   1. Acute on chronic diastolic CHF: With prominent RV dysfunction.  Echo from 11/22 reviewed, EF 60-65%, normal RV systolic function with mild dilation, D-shaped septum consistent with RV pressure/volume overload. Patient is markedly volume overloaded with anasarca.  Unfortunately, creatinine is now up to 3.68. RHC yesterday w/ markedly elevated left and right filling pressure, mod pulmonary venous HTN and preserved CO. PAPi low at 0.82. Now on lasix gtt at 15/hr but diuresis remains sluggish.  - D/w nephrology at bedside, recommending continuing lasix gtt at 15/hr for cumulative 24 hrs. If UOP today remains < 3L, can add metolazone to augment diuresis - may need milrinone for RV support  2. AKI on CKD stage III: Baseline creatinine 1.9-2.3, now up to 3.68 in the setting of severe volume overload.  Suspect cardiorenal syndrome. RHC w/ normal CO. As above, hopefully will stabilize/improve with diuresis and lowering of renal venous pressure.  - Nephrology following, see recs above  3. Cirrhosis: Uncertain etiology, labs negative for viral hepatitis.  Not a heavy drinker.  May be due to NAFLD or RV failure.  4. OSA: Continue CPAP.    Length of Stay: [address], PA-C  07/27/2021, 8:11 AM  Advanced Heart Failure Team Pager [phone] (M-F; 7a - 5p)  Please contact [facility] for night-coverage after hours (5p -7a ) and weekends on [email or web address]  Patient seen with PA, agree with the above note.   Lasix gtt started yesterday and she had 1 dose of metolazone after RHC showing markedly elevated right and left heart filling pressures. Creatinine stable, 3.62 => 3.68.  She has not diuresed much on this regimen.    General: NAD Neck: JVP 16+, no thyromegaly or thyroid nodule.  Lungs: Clear to auscultation bilaterally with normal respiratory effort. CV: Nonpalpable PMI.  Heart regular S1/S2, no S3/S4, no murmur.  2+ edema to torso.    Abdomen: Soft, nontender,  no hepatosplenomegaly, no distention.  Skin: Intact without lesions or rashes.  Neurologic: Alert and oriented x 3.  Psych: Normal affect. Extremities: No clubbing or cyanosis.  HEENT: Normal.   Patient has severe LV diastolic dysfunction with RV failure.  Though CI is preserved, PAPI is low (<1).  She has diuresed poorly with current regimen with stable creatinine (cardiorenal syndrome).  She is markedly volume overloaded, given how high her filling pressures are with relatively minimal symptoms, suspect she has been like this chronically.  - Start milrinone 0.125 with poor diuresis and low PAPI for RV support.  - Start midodrine 5 mg tid with soft BP.  - Increase Lasix gtt to 20 mg/hr.  - Metolazone 5 mg po bid today.  - If she continues to diurese inadequately and creatinine remains stable, can add acetazolamide tomorrow.  -  Follow creatinine closely, replace K carefully.  - If we cannot diurese her medically, may need at least temporary dialysis.   Loralie Champagne 07/27/2021 9:07 AM

## 2021-07-27 NOTE — Progress Notes (Signed)
Mobility Specialist Progress Note    07/27/21 1559  Mobility  Activity Refused mobility   Pt stated she was not feeling well and may get up later if she feels better.   [name] Mobility Specialist  M.S. Primary Phone: [phone] M.S. Secondary Phone: [phone]

## 2021-07-27 NOTE — Progress Notes (Addendum)
[facility] PROGRESS NOTE    Progress Note  Lauren Osborn  YNW:[id number] DOB: September 07, 1975 DOA: 07/22/2021 PCP: [name], MD     Brief Narrative:   Lauren Osborn is an 45 y.o. female past medical history significant for essential hypertension, hepatic cirrhosis, severe morbid obesity, severe Obstructive Sleep Apnea on nightly CPAP, chronic kidney disease stage 3B who presents to the emergency room due to shortness of breath and increased lower extremity swelling along with abdominal distention and weight gain for about 2 weeks of about 20 to 30 pounds.  She has also been complaining of 1 week of right upper quadrant abdominal pain.  [place] 11/22 with markedly elevated left and right heart filling pressures, moderate pulmonary venous hypertension and preserved cardiac output.  Treating for severe LV diastolic dysfunction with RV failure, is on IV Lasix and milrinone drip.  Cardiology and nephrology assisting.    Assessment/Plan:   Anasarca due to acute on chronic diastolic CHF/right heart failure - She was massively fluid overloaded with edema up to her rib cage. - Underlying cirrhosis and pulmonary hypertension contributing. - 2D echo 11/22: LVEF 60 to 65%, normal RV systolic function with mild dilatation, D-shaped septum consistent with RV pressure/volume overload. - [place] 11/22 with marked elevated left and right filling pressure, moderate pulmonary venous hypertension and preserved cardiac output. -VQ scan 11/21: No PE. - Per cardiology follow-up, diuresed poorly with Lasix gtt. and a dose of metolazone after RHC yesterday.  Creatinine stable.  Suspecting cardiorenal syndrome. - Now started on milrinone drip, midodrine for soft blood pressure, increase Lasix gtt. to 20 mg/h and on metolazone 5 Mg twice daily. - Cardiology also indicates that if we cannot diurese her medically, may need at least temporary dialysis.  Nephrology on board. - Continue bilateral lower extremity Unna  boots.  Right upper quadrant abdominal pain: - Cholelithiasis without cholecystitis on abdominal ultrasound.  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 -May be due to passive hepatic congestion from right heart failure.  Hepatic cirrhosis: - Aldactone held, LFTs unremarkable.  History of hepatic hemangioma: - Confirmed by MRI about the same size.  Outpatient follow-up.  Essential hypertension: - Her blood pressure is soft.  Started on midodrine. - Continue to hold her antihypertensive medication outside of her diuretics.  Lower extremity swelling: - Lower extremity Doppler negative for DVT.  Body mass index is 55.91 kg/m./Severe morbid obesity: - Counseling probably contributing to a lot of her problems.  OSA on CPAP - continue  Acute kidney injury complicating chronic kidney disease stage 3B - Nephrology following.  Baseline creatinine 1.9-2.3 -AKI suspected hemodynamically mediated in setting of decompensated CHF and diuresis. -Creatinine gradually creeping up but not much over the last 3 days.  Continue to follow BMP.  Hyponatremia -Likely due to hypervolemia and CKD. - Continue Lasix and follow BMP.  Stable.  GERD: - Continue PPI.  Anemia of chronic renal disease: - Most likely due to renal failure no signs of overt bleeding.  Continue to monitor creatinine intermittently.  DVT prophylaxis: lovenox CODE STATUS: Full Family Communication: None at bedside. Status is: Inpatient  Remains inpatient appropriate because: Acute systolic heart failure with abdominal pain.    Procedures and diagnostic studies:   CARDIAC CATHETERIZATION  Result Date: 07/26/2021 1. Markedly elevated left and right heart filling pressures. 2. Moderate pulmonary venous hypertension. 3. Preserved cardiac output.  Medical Consultants:    Cardiology Nephrology   Subjective:    Seen this morning.  Was on CPAP overnight.  Reports that she does not feel any different with no significant change in dyspnea.  Dyspnea on minimal exertion even to the bathroom here.  Objective:    Vitals:   07/26/21 2312 07/27/21 0540 07/27/21 1137 07/27/21 1550  BP:  102/75 97/73 108/69  Pulse: 78  84 87  Resp: 16 18 18 18   Temp:  97.9 F (36.6 C) 98 F (36.7 C) 98 F (36.7 C)  TempSrc:  Oral Oral Oral  SpO2: 97% 93%    Weight:  (!) 143.2 kg    Height:       SpO2: 93 % O2 Flow Rate (L/min): 2 L/min FiO2 (%): 21 %   Intake/Output Summary (Last 24 hours) at 07/27/2021 1623 Last data filed at 07/27/2021 0600 Gross per 24 hour  Intake 240 ml  Output 975 ml  Net -735 ml   Filed Weights   07/25/21 0452 07/26/21 0400 07/27/21 0540  Weight: (!) 141.5 kg (!) 142.4 kg (!) 143.2 kg    Exam: General exam: Young female, moderately built and morbidly obese lying comfortably propped up in bed. Respiratory system: Clear to auscultation anteriorly.  Somewhat diminished in the bases.  No wheezing, rhonchi or crackles.  No increased work of breathing. Cardiovascular system: S1 and S2 heard, RRR.  No JVD, murmurs.  Telemetry personally reviewed: Sinus rhythm.  Has anasarca with 3+ pitting bilateral leg edema going up to anterior abdominal wall anteriorly. Gastrointestinal system: Abdomen is nondistended but obese, soft and nontender.  Extremities: 3+ edema.  Has bilateral lower extremity Unna boots. Psychiatry: Judgement and insight appear normal. Mood & affect appropriate.   Data Reviewed:    Labs: Basic Metabolic Panel: Recent Labs  Lab 07/22/21 1148 07/22/21 1153 07/23/21 0551 07/24/21 0210 07/25/21 0123 07/26/21 0224 07/26/21 1317 07/26/21 1413 07/27/21 0226  NA 136   < > 136 135 134* 133* 132* 135  135 133*  K 4.6   < > 5.0 4.3 4.6 4.2 4.1 4.0  3.9 3.6  CL 97*   < > 99 99 97* 96* 95*  --  96*  CO2 27   < > 26 26  26 24 24   --  23  GLUCOSE 92   < > 93 97 97 91 87  --  93  BUN 44*   < > 46* 44* 49* 57* 58*  --  60*  CREATININE 2.76*   < > 2.84* 3.21* 3.47* 3.58* 3.62*  --  3.68*  CALCIUM 9.6   < > 9.2 9.3 9.2 9.4 9.3  --  9.3  MG  --   --  2.0  --   --   --  2.1  --  2.1  PHOS 4.1  --  4.4  --   --   --   --   --   --    < > = values in this interval not displayed.   GFR Estimated Creatinine Clearance: 26.5 mL/min (A) (by C-G formula based on SCr of 3.68 mg/dL (H)). Liver Function Tests: Recent Labs  Lab 07/22/21 1144 07/22/21 1148 07/22/21 1153 07/22/21 1755 07/23/21 0551  AST 38  --  38 40 34  ALT 18  --  18 18 16   ALKPHOS 84  --  89 88 79  BILITOT 2.7*  --  2.7* 2.4* 2.5*  PROT 7.8  --  7.9 7.7 7.2  ALBUMIN 4.1 4.0 4.1 4.0 3.7   Recent Labs  Lab 07/22/21 1144 07/22/21 1755  LIPASE 41 46    Coagulation profile Recent Labs  Lab 07/22/21 1755  INR 1.2     Lab Results  Component Value Date   SARSCOV2NAA NEGATIVE 07/22/2021   SARSCOV2NAA NEGATIVE 06/23/2021   SARSCOV2NAA NEGATIVE 09/15/2020   [place] NEGATIVE 08/02/2020    CBC: Recent Labs  Lab 07/22/21 1144 07/22/21 1153 07/22/21 1755 07/23/21 0551 07/26/21 1413  WBC 6.0 6.2 7.0 5.8  --   NEUTROABS 4.4 4.5 5.2  --   --   HGB 11.9* 11.8* 11.9* 11.2* 11.2*  11.2*  HCT 37.0 36.5 37.1 35.1* 33.0*  33.0*  MCV 97.6 98.6 100.0 100.0  --   PLT 265 269 274 245  --     Microbiology Recent Results (from the past 240 hour(s))  Resp Panel by RT-PCR (Flu A&B, Covid) Nasopharyngeal Swab     Status: None   Collection Time: 07/22/21  9:54 PM   Specimen: Nasopharyngeal Swab; Nasopharyngeal(NP) swabs in vial transport medium  Result Value Ref Range Status   SARS Coronavirus 2 by RT PCR NEGATIVE NEGATIVE Final    Comment: (NOTE) SARS-CoV-2 target nucleic acids are NOT DETECTED.  The SARS-CoV-2 RNA is generally detectable in upper respiratory specimens during the acute phase of infection. The lowest concentration of  SARS-CoV-2 viral copies this assay can detect is 138 copies/mL. A negative result does not preclude SARS-Cov-2 infection and should not be used as the sole basis for treatment or other patient management decisions. A negative result may occur with  improper specimen collection/handling, submission of specimen other than nasopharyngeal swab, presence of viral mutation(s) within the areas targeted by this assay, and inadequate number of viral copies(<138 copies/mL). A negative result must be combined with clinical observations, patient history, and epidemiological information. The expected result is Negative.  Fact Sheet for Patients:  EntrepreneurPulse.com.au  Fact Sheet for Healthcare Providers:  IncredibleEmployment.be  This test is no t yet approved or cleared by the [place] FDA and  has been authorized for detection and/or diagnosis of SARS-CoV-2 by FDA under an Emergency Use Authorization (EUA). This EUA will remain  in effect (meaning this test can be used) for the duration of the COVID-19 declaration under Section 564(b)(1) of the Act, 21 U.S.C.section 360bbb-3(b)(1), unless the authorization is terminated  or revoked sooner.       Influenza A by PCR NEGATIVE NEGATIVE Final   Influenza B by PCR NEGATIVE NEGATIVE Final    Comment: (NOTE) The Xpert Xpress SARS-CoV-2/FLU/RSV plus assay is intended as an aid in the diagnosis of influenza from Nasopharyngeal swab specimens and should not be used as a sole basis for treatment. Nasal washings and aspirates are unacceptable for Xpert Xpress SARS-CoV-2/FLU/RSV testing.  Fact Sheet for Patients: EntrepreneurPulse.com.au  Fact Sheet for Healthcare Providers: IncredibleEmployment.be  This test is not yet approved or cleared by the [place] FDA and has been authorized for detection and/or diagnosis of SARS-CoV-2 by FDA under an Emergency Use  Authorization (EUA). This EUA will remain in effect (meaning this test can be used) for the duration of the COVID-19 declaration under Section 564(b)(1) of the Act, 21 U.S.C. section 360bbb-3(b)(1), unless the authorization is terminated or revoked.  Performed at [facility], [address], [place], [place] [address]      Medications:    cholecalciferol  1,000 Units Oral Daily   enoxaparin (LOVENOX) injection  40 mg Subcutaneous Daily  metolazone  5 mg Oral BID   midodrine  5 mg Oral TID WC   pantoprazole  40 mg Oral Daily   sodium chloride flush  3 mL Intravenous Q12H   Continuous Infusions:  furosemide (LASIX) 200 mg in dextrose 5% 100 mL (2mg /mL) infusion 20 mg/hr (07/27/21 1036)   milrinone 0.125 mcg/kg/min (07/27/21 1041)      LOS: 5 days   Vernell Leep, MD,  FACP, [name], [facility], [facility] (Care Management Physician Certified) [place]  To contact the attending provider between 7A-7P or the covering provider during after hours 7P-7A, please log into the web site [email or web address] and access using universal [place] password for that web site. If you do not have the password, please call the hospital operator.

## 2021-07-28 DIAGNOSIS — R601 Generalized edema: Secondary | ICD-10-CM | POA: Diagnosis not present

## 2021-07-28 DIAGNOSIS — N179 Acute kidney failure, unspecified: Secondary | ICD-10-CM | POA: Diagnosis not present

## 2021-07-28 DIAGNOSIS — I5033 Acute on chronic diastolic (congestive) heart failure: Secondary | ICD-10-CM | POA: Diagnosis not present

## 2021-07-28 LAB — BASIC METABOLIC PANEL
Anion gap: 11 (ref 5–15)
BUN: 63 mg/dL — ABNORMAL HIGH (ref 6–20)
CO2: 26 mmol/L (ref 22–32)
Calcium: 9.1 mg/dL (ref 8.9–10.3)
Chloride: 96 mmol/L — ABNORMAL LOW (ref 98–111)
Creatinine, Ser: 3.65 mg/dL — ABNORMAL HIGH (ref 0.44–1.00)
GFR, Estimated: 15 mL/min — ABNORMAL LOW (ref >60)
Glucose, Bld: 103 mg/dL — ABNORMAL HIGH (ref 70–99)
Potassium: 3.1 mmol/L — ABNORMAL LOW (ref 3.5–5.1)
Sodium: 133 mmol/L — ABNORMAL LOW (ref 135–145)

## 2021-07-28 LAB — MAGNESIUM: Magnesium: 2 mg/dL (ref 1.7–2.4)

## 2021-07-28 MED ORDER — METOLAZONE 5 MG PO TABS
5.0000 mg | ORAL_TABLET | Freq: Every day | ORAL | Status: DC
  Administered 2021-07-28 – 2021-07-29 (×2): 5 mg via ORAL
  Filled 2021-07-28 (×2): qty 1

## 2021-07-28 MED ORDER — POTASSIUM CHLORIDE CRYS ER 20 MEQ PO TBCR
40.0000 meq | EXTENDED_RELEASE_TABLET | ORAL | Status: AC
  Administered 2021-07-28 (×2): 40 meq via ORAL
  Filled 2021-07-28 (×2): qty 2

## 2021-07-28 MED ORDER — POTASSIUM CHLORIDE CRYS ER 20 MEQ PO TBCR
40.0000 meq | EXTENDED_RELEASE_TABLET | Freq: Once | ORAL | Status: AC
  Administered 2021-07-28: 40 meq via ORAL
  Filled 2021-07-28: qty 2

## 2021-07-28 NOTE — Progress Notes (Signed)
Orthopedic Tech Progress Note Patient Details:  Lauren Osborn [birth year] [id number]  Called by Alda Lea, RN as pt's unna boots were too tight/rolling down. Previous unna boots were removed and there was no remaining unna boot paste to be cleaned off legs. No signs of any wounds or areas of irritation. Reapplied unna boots to BLE with less tension when wrapping and confirmed with pt that it did not feel too tight.  Ortho Devices Type of Ortho Device: Haematologist Ortho Device/Splint Location: BLE Ortho Device/Splint Interventions: Ordered, Application, Adjustment, Removal   Post Interventions Patient Tolerated: Well Instructions Provided: Adjustment of device  [name] [name] 07/28/2021, 3:15 PM

## 2021-07-28 NOTE — Progress Notes (Signed)
[facility] PROGRESS NOTE    Progress Note  Lauren Osborn  WIO:[id number] DOB: [birth year] DOA: 07/22/2021 PCP: [name], MD     Brief Narrative:   Lauren Osborn is an 45 y.o. female past medical history significant for essential hypertension, hepatic cirrhosis, severe morbid obesity, severe Obstructive Sleep Apnea on nightly CPAP, chronic kidney disease stage 3B who presents to the emergency room due to shortness of breath and increased lower extremity swelling along with abdominal distention and weight gain for about 2 weeks of about 20 to 30 pounds.  She has also been complaining of 1 week of right upper quadrant abdominal pain.  [place] 11/22 with markedly elevated left and right heart filling pressures, moderate pulmonary venous hypertension and preserved cardiac output.  Treating for severe LV diastolic dysfunction with RV failure, is on IV Lasix and milrinone drip.  Cardiology and nephrology assisting.    Assessment/Plan:   Anasarca due to acute on chronic diastolic CHF/right heart failure - She was massively fluid overloaded with edema up to her rib cage. - Underlying cirrhosis and pulmonary hypertension contributing. - 2D echo 11/22: LVEF 60 to 65%, normal RV systolic function with mild dilatation, D-shaped septum consistent with RV pressure/volume overload. - [place] 11/22 with marked elevated left and right filling pressure, moderate pulmonary venous hypertension and preserved cardiac output. -VQ scan 11/21: No PE. - Per cardiology follow-up, diuresed poorly with Lasix gtt. and a dose of metolazone after RHC yesterday.  Creatinine stable.  Suspecting cardiorenal syndrome. - initiated on milrinone drip, midodrine for soft blood pressure, increase Lasix gtt. to 20 mg/h and on metolazone 5 Mg twice daily. Improved diuresis but still modest. Hence, AHF Cards discussed with Nephrology and further increased Lasix gtt to 30. - Cardiology also indicates that if we cannot diurese her  medically, may need at least temporary dialysis.  Nephrology on board. - Continue bilateral lower extremity Unna boots.  Right upper quadrant abdominal pain: - Cholelithiasis without cholecystitis on abdominal ultrasound.  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 -May be due to passive hepatic congestion from right heart failure.  Hepatic cirrhosis: - Aldactone held, LFTs unremarkable.  History of hepatic hemangioma: - Confirmed by MRI about the same size.  Outpatient follow-up.  Essential hypertension: - Her blood pressure is soft.  Started on midodrine. - Continue to hold her antihypertensive medication outside of her diuretics.  Lower extremity swelling: - Lower extremity Doppler negative for DVT.  Body mass index is 55.46 kg/m./Severe morbid obesity: - Counseling probably contributing to a lot of her problems.  OSA on CPAP - continue  Acute kidney injury complicating chronic kidney disease stage 3B - Nephrology following.  Baseline creatinine 1.9-2.3 -AKI suspected hemodynamically mediated in setting of decompensated CHF and diuresis. -Creatinine has plateaued in the 3.5-3.6 range for the last 4 days.  Monitor closely while on aggressive IV diuresis.  Hyponatremia -Likely due to hypervolemia and CKD. - Continue Lasix and follow BMP.  Stable.  GERD: - Continue PPI.  Anemia of chronic renal disease: - Most likely due to renal failure no signs of overt bleeding.  Continue to monitor creatinine intermittently.  DVT prophylaxis: lovenox CODE STATUS: Full Family Communication: None at bedside. Status is: Inpatient  Remains inpatient appropriate because: Acute systolic heart failure with abdominal pain.    Procedures and diagnostic studies:   No results found.   Medical Consultants:  Cardiology Nephrology   Subjective:    Reports that she had  more urine output in the last 24 hours.  DOE-cannot say if it is better.  Still with lower extremity swelling more than upper extremities.  Usually sleeps in lateral position due to back pain and not due to dyspnea.  Objective:    Vitals:   07/27/21 2200 07/28/21 0454 07/28/21 0852 07/28/21 1238  BP:   96/68 104/63  Pulse: 92     Resp: 19     Temp:      TempSrc:      SpO2:      Weight:  (!) 142 kg    Height:       SpO2: 93 % O2 Flow Rate (L/min): 2 L/min FiO2 (%): 21 %   Intake/Output Summary (Last 24 hours) at 07/28/2021 1419 Last data filed at 07/28/2021 1233 Gross per 24 hour  Intake 1248.27 ml  Output 2800 ml  Net -1551.73 ml   Filed Weights   07/26/21 0400 07/27/21 0540 07/28/21 0454  Weight: (!) 142.4 kg (!) 143.2 kg (!) 142 kg    Exam: General exam: Young female, moderately built and morbidly obese sitting up comfortably in chair having breakfast this morning. Respiratory system: Clear to auscultation.  No increased work of breathing. Cardiovascular system: S1 and S2 heard, RRR.  No JVD, murmurs.  Telemetry personally reviewed: Sinus rhythm..  Has anasarca with 3+ pitting bilateral leg edema going up to anterior abdominal wall anteriorly-really not much change since yesterday.  Also has trace pitting bilateral forearm edema. Gastrointestinal system: Abdomen is nondistended but obese, soft and nontender.  Extremities: 3+ edema.  Has bilateral lower extremity Unna boots. Psychiatry: Judgement and insight appear normal. Mood & affect appropriate.   Data Reviewed:    Labs: Basic Metabolic Panel: Recent Labs  Lab 07/22/21 1148 07/22/21 1153 07/23/21 0551 07/24/21 0210 07/25/21 0123 07/26/21 0224 07/26/21 1317 07/26/21 1413 07/27/21 0226 07/28/21 0155  NA 136   < > 136   < > 134* 133* 132* 135  135 133* 133*  K 4.6   < > 5.0   < > 4.6 4.2 4.1 4.0  3.9 3.6 3.1*  CL 97*   < > 99   < > 97* 96* 95*  --  96* 96*  CO2 27   < > 26   < > 26 24 24   --  23 26   GLUCOSE 92   < > 93   < > 97 91 87  --  93 103*  BUN 44*   < > 46*   < > 49* 57* 58*  --  60* 63*  CREATININE 2.76*   < > 2.84*   < > 3.47* 3.58* 3.62*  --  3.68* 3.65*  CALCIUM 9.6   < > 9.2   < > 9.2 9.4 9.3  --  9.3 9.1  MG  --   --  2.0  --   --   --  2.1  --  2.1 2.0  PHOS 4.1  --  4.4  --   --   --   --   --   --   --    < > = values in this interval not displayed.   GFR Estimated Creatinine Clearance: 26.5 mL/min (A) (by C-G formula based on SCr of 3.65 mg/dL (H)). Liver Function Tests: Recent Labs  Lab 07/22/21 1144 07/22/21 1148 07/22/21 1153 07/22/21 1755 07/23/21 0551  AST 38  --  38 40 34  ALT 18  --  18 18 16   ALKPHOS 84  --  89 88 79  BILITOT 2.7*  --  2.7* 2.4* 2.5*  PROT 7.8  --  7.9 7.7 7.2  ALBUMIN 4.1 4.0 4.1 4.0 3.7   Recent Labs  Lab 07/22/21 1144 07/22/21 1755  LIPASE 41 46    Coagulation profile Recent Labs  Lab 07/22/21 1755  INR 1.2     Lab Results  Component Value Date   SARSCOV2NAA NEGATIVE 07/22/2021   SARSCOV2NAA NEGATIVE 06/23/2021   SARSCOV2NAA NEGATIVE 09/15/2020   [place] NEGATIVE 08/02/2020    CBC: Recent Labs  Lab 07/22/21 1144 07/22/21 1153 07/22/21 1755 07/23/21 0551 07/26/21 1413  WBC 6.0 6.2 7.0 5.8  --   NEUTROABS 4.4 4.5 5.2  --   --   HGB 11.9* 11.8* 11.9* 11.2* 11.2*  11.2*  HCT 37.0 36.5 37.1 35.1* 33.0*  33.0*  MCV 97.6 98.6 100.0 100.0  --   PLT 265 269 274 245  --     Microbiology Recent Results (from the past 240 hour(s))  Resp Panel by RT-PCR (Flu A&B, Covid) Nasopharyngeal Swab     Status: None   Collection Time: 07/22/21  9:54 PM   Specimen: Nasopharyngeal Swab; Nasopharyngeal(NP) swabs in vial transport medium  Result Value Ref Range Status   SARS Coronavirus 2 by RT PCR NEGATIVE NEGATIVE Final    Comment: (NOTE) SARS-CoV-2 target nucleic acids are NOT DETECTED.  The SARS-CoV-2 RNA is generally detectable in upper respiratory specimens during the acute phase of infection. The  lowest concentration of SARS-CoV-2 viral copies this assay can detect is 138 copies/mL. A negative result does not preclude SARS-Cov-2 infection and should not be used as the sole basis for treatment or other patient management decisions. A negative result may occur with  improper specimen collection/handling, submission of specimen other than nasopharyngeal swab, presence of viral mutation(s) within the areas targeted by this assay, and inadequate number of viral copies(<138 copies/mL). A negative result must be combined with clinical observations, patient history, and epidemiological information. The expected result is Negative.  Fact Sheet for Patients:  EntrepreneurPulse.com.au  Fact Sheet for Healthcare Providers:  IncredibleEmployment.be  This test is no t yet approved or cleared by the [place] FDA and  has been authorized for detection and/or diagnosis of SARS-CoV-2 by FDA under an Emergency Use Authorization (EUA). This EUA will remain  in effect (meaning this test can be used) for the duration of the COVID-19 declaration under Section 564(b)(1) of the Act, 21 U.S.C.section 360bbb-3(b)(1), unless the authorization is terminated  or revoked sooner.       Influenza A by PCR NEGATIVE NEGATIVE Final   Influenza B by PCR NEGATIVE NEGATIVE Final    Comment: (NOTE) The Xpert Xpress SARS-CoV-2/FLU/RSV plus assay is intended as an aid in the diagnosis of influenza from Nasopharyngeal swab specimens and should not be used as a sole basis for treatment. Nasal washings and aspirates are unacceptable for Xpert Xpress SARS-CoV-2/FLU/RSV testing.  Fact Sheet for Patients: EntrepreneurPulse.com.au  Fact Sheet for Healthcare Providers: IncredibleEmployment.be  This test is not yet approved or cleared by the [place] FDA and has been authorized for detection and/or diagnosis of SARS-CoV-2 by FDA under  an Emergency Use Authorization (EUA). This EUA will remain in effect (meaning this test can be used) for the duration of the COVID-19 declaration under Section 564(b)(1) of the Act, 21 U.S.C. section 360bbb-3(b)(1), unless the authorization is terminated or  revoked.  Performed at [facility], [address] [place], [place] [address]      Medications:    cholecalciferol  1,000 Units Oral Daily   enoxaparin (LOVENOX) injection  40 mg Subcutaneous Daily   metolazone  5 mg Oral Daily   midodrine  5 mg Oral TID WC   pantoprazole  40 mg Oral Daily   potassium chloride  40 mEq Oral Q4H   sodium chloride flush  3 mL Intravenous Q12H   Continuous Infusions:  furosemide (LASIX) 200 mg in dextrose 5% 100 mL (2mg /mL) infusion 30 mg/hr (07/28/21 1346)   milrinone 0.125 mcg/kg/min (07/28/21 0452)      LOS: 6 days   Vernell Leep, MD,  FACP, [facility], [facility], [facility] (Care Management Physician Certified) [place]  To contact the attending provider between 7A-7P or the covering provider during after hours 7P-7A, please log into the web site [email or web address] and access using universal [place] password for that web site. If you do not have the password, please call the hospital operator.

## 2021-07-28 NOTE — Plan of Care (Signed)

## 2021-07-28 NOTE — Progress Notes (Signed)
Advanced Heart Failure Rounding Note  PCP-Cardiologist: Rozann Lesches, MD   Subjective:    [place] 11/22 w/ markedly elevated left and right filling pressure, mod pulmonary venous HTN and preserved CO   Yesterday milrinone, midodrine and metolszone added and lasix gtt increased 15-> 20 due to poor urine output and need for RV support.   Urine output picked up. Out > 3L. Weight down 2 pounds   ScR unchanged 3.65  RHC 07/26/21  Right Heart Pressures RHC Procedural Findings: Hemodynamics (mmHg) RA mean 35 RV 57/28, 35 PA 58/29, mean 46 PCWP mean 42  Oxygen saturations: PA 70% AO 99%  Cardiac Output (Fick) 7.04  Cardiac Index (Fick) 3.01 PVR < 1 WU PAPI < 1    Objective:   Weight Range: (!) 142 kg Body mass index is 55.46 kg/m.   Vital Signs:   Temp:  [98 F (36.7 C)-98.6 F (37 C)] 98.6 F (37 C) (11/23 2016) Pulse Rate:  [84-92] 92 (11/23 2200) Resp:  [18-19] 19 (11/23 2200) BP: (96-108)/(64-73) 96/68 (11/24 0852) SpO2:  [93 %] 93 % (11/23 2016) Weight:  [142 kg] 142 kg (11/24 0454) Last BM Date: 07/27/21  Weight change: Filed Weights   07/26/21 0400 07/27/21 0540 07/28/21 0454  Weight: (!) 142.4 kg (!) 143.2 kg (!) 142 kg    Intake/Output:   Intake/Output Summary (Last 24 hours) at 07/28/2021 1123 Last data filed at 07/28/2021 0830 Gross per 24 hour  Intake 1248.27 ml  Output 3050 ml  Net -1801.73 ml       Physical Exam    General:  Sitting up in bed No resp difficulty HEENT: normal Neck: supple. JVP to ear. Carotids 2+ bilat; no bruits. No lymphadenopathy or thryomegaly appreciated. Cor: PMI nondisplaced. Regular rate & rhythm. No rubs, gallops or murmurs. Lungs: clear Abdomen: Obese nontender, + distended. No hepatosplenomegaly. No bruits or masses. Good bowel sounds. Extremities: no cyanosis, clubbing, rash, 3+ edema + UNNA Neuro: alert & orientedx3, cranial nerves grossly intact. moves all 4 extremities w/o difficulty. Affect  pleasant   Telemetry   NSR 80-90s Personally reviewed   Labs    CBC Recent Labs    07/26/21 1413  HGB 11.2*  11.2*  HCT 33.0*  33.0*    Basic Metabolic Panel Recent Labs    07/27/21 0226 07/28/21 0155  NA 133* 133*  K 3.6 3.1*  CL 96* 96*  CO2 23 26  GLUCOSE 93 103*  BUN 60* 63*  CREATININE 3.68* 3.65*  CALCIUM 9.3 9.1  MG 2.1 2.0    Liver Function Tests No results for input(s): AST, ALT, ALKPHOS, BILITOT, PROT, ALBUMIN in the last 72 hours. No results for input(s): LIPASE, AMYLASE in the last 72 hours. Cardiac Enzymes No results for input(s): CKTOTAL, CKMB, CKMBINDEX, TROPONINI in the last 72 hours.  BNP: BNP (last 3 results) Recent Labs    03/04/21 1222 06/23/21 1807 07/22/21 1755  BNP 543.0* 357.0* 510.0*     ProBNP (last 3 results) No results for input(s): PROBNP in the last 8760 hours.   D-Dimer No results for input(s): DDIMER in the last 72 hours. Hemoglobin A1C No results for input(s): HGBA1C in the last 72 hours. Fasting Lipid Panel No results for input(s): CHOL, HDL, LDLCALC, TRIG, CHOLHDL, LDLDIRECT in the last 72 hours. Thyroid Function Tests No results for input(s): TSH, T4TOTAL, T3FREE, THYROIDAB in the last 72 hours.  Invalid input(s): FREET3  Other results:   Imaging    No results found.  Medications:     Scheduled Medications:  cholecalciferol  1,000 Units Oral Daily   enoxaparin (LOVENOX) injection  40 mg Subcutaneous Daily   midodrine  5 mg Oral TID WC   pantoprazole  40 mg Oral Daily   sodium chloride flush  3 mL Intravenous Q12H    Infusions:  furosemide (LASIX) 200 mg in dextrose 5% 100 mL (2mg /mL) infusion 20 mg/hr (07/28/21 0522)   milrinone 0.125 mcg/kg/min (07/28/21 0452)    PRN Medications: camphor-menthol, HYDROmorphone (DILAUDID) injection    Assessment/Plan   1. Acute on chronic diastolic CHF: With prominent RV dysfunction.  Echo from 11/22 reviewed, EF 60-65%, normal RV systolic function  with mild dilation, D-shaped septum consistent with RV pressure/volume overload. Patient is markedly volume overloaded with anasarca.  Unfortunately, creatinine is now up to 3.68. RHC  w/ markedly elevated left and right filling pressure, mod pulmonary venous HTN and preserved CO. PAPi low at 0.82. Milrinone, midodrine and metolazone added and lasix gtt increased 15-> 20 on 11/23 due to poor urine output and need for RV support.  - Diuresis picking up but still modest. I spoke to Dr. Posey Pronto. We will increase lasix gtt to 30.Continue metolazone (I will decrease to 5mg  daily until we catch up with hypokalemia) - Daily BMET - Continue milrinone and IV lasix.  2. AKI on CKD stage IIIb: Baseline creatinine 1.9-2.3, now up to 3.68 in the setting of severe volume overload.  Suspect cardiorenal syndrome. RHC w/ normal CO. As above, hopefully will stabilize/improve with diuresis and lowering of renal venous pressure. Creatinine stable today  - Nephrology following 3. Cirrhosis: Uncertain etiology, labs negative for viral hepatitis.  Not a heavy drinker.  May be due to NAFLD or RV failure.  4. OSA: Continue CPAP.  5. Hyponatremia: NA 133 - restrict FW 6. Morbid obesity - Body mass index is 55.46 kg/m. 7. Hypokalemia - Will supp. Discussed dosing with PharmD personally.    Length of Stay: [place], MD  07/28/2021, 11:23 AM  Advanced Heart Failure Team Pager [phone] (M-F; 7a - 5p)  Please contact [facility] for night-coverage after hours (5p -7a ) and weekends on [email or web address]

## 2021-07-28 NOTE — Progress Notes (Signed)
Patient ID: Lauren Osborn, female   DOB: 11-23-1975, 45 y.o.   MRN: [id number] [facility] Progress Note   Assessment/ Plan:   1. Acute kidney Injury on chronic kidney disease stage IIIb (baseline creatinine 1.9-2.3): Appears hemodynamically mediated in the setting of acute exacerbation of diastolic heart failure with worsening from ongoing diuresis.  Decent response to addition of metolazone to furosemide drip overnight with 3 L urine output and unchanged renal function.  Continue current dose of diuretic and follow weights/labs. 2.  Hyponatremia: Secondary to CHF exacerbation/impaired free water deficit and inappropriate ADH activation-I have ordered for 1.2 L/day fluid restriction and will monitor on furosemide drip. 3.  Acute exacerbation of diastolic heart failure: Diuretics transiently held secondary to worsening renal insufficiency, continue fluid restriction at 1.5 L a day.  VQ scan negative for PE and right heart catheterization shows markedly elevated left and right heart filling pressures with moderate pulmonary venous hypertension and preserved cardiac output.  Decent response to diuretics noted overnight-continue current dose. 4.  Anemia: Hemoglobin and hematocrit slightly lower-unclear if this is dilutional from CHF exacerbation, no indication for ESA.  Subjective:   Happy to learn that she had decent urine output with stable renal function overnight but does not subjectively feel any better.  Denies any chest pain or shortness of breath at rest  Objective:   BP 106/64 (BP Location: Right Arm)   Pulse 92   Temp 98.6 F (37 C) (Oral)   Resp 19   Ht 5\' 3"  (1.6 m)   Wt (!) 142 kg   SpO2 93%   BMI 55.46 kg/m   Intake/Output Summary (Last 24 hours) at 07/28/2021 0347 Last data filed at 07/28/2021 0455 Gross per 24 hour  Intake 1308.27 ml  Output 3050 ml  Net -1741.73 ml   Weight change: -1.134 kg  Physical Exam: Gen: Sitting comfortably in recliner, on Lasix  drip CVS: Pulse regular rhythm, normal rate, S1 and S2 normal Resp: Poor inspiratory effort with decreased breath sounds over bases, no distinct rales or rhonchi Abd: Soft, obese, nontender Ext: 2+ pitting edema over lower extremities  Imaging: CARDIAC CATHETERIZATION  Result Date: 07/26/2021 1. Markedly elevated left and right heart filling pressures. 2. Moderate pulmonary venous hypertension. 3. Preserved cardiac output.    Labs: BMET Recent Labs  Lab 07/22/21 1148 07/22/21 1153 07/23/21 0551 07/24/21 0210 07/25/21 0123 07/26/21 0224 07/26/21 1317 07/26/21 1413 07/27/21 0226 07/28/21 0155  NA 136   < > 136 135 134* 133* 132* 135  135 133* 133*  K 4.6   < > 5.0 4.3 4.6 4.2 4.1 4.0  3.9 3.6 3.1*  CL 97*   < > 99 99 97* 96* 95*  --  96* 96*  CO2 27   < > 26 26 26 24 24   --  23 26  GLUCOSE 92   < > 93 97 97 91 87  --  93 103*  BUN 44*   < > 46* 44* 49* 57* 58*  --  60* 63*  CREATININE 2.76*   < > 2.84* 3.21* 3.47* 3.58* 3.62*  --  3.68* 3.65*  CALCIUM 9.6   < > 9.2 9.3 9.2 9.4 9.3  --  9.3 9.1  PHOS 4.1  --  4.4  --   --   --   --   --   --   --    < > = values in this interval not displayed.   CBC Recent Labs  Lab 07/22/21 1144 07/22/21 1153 07/22/21 1755 07/23/21 0551 07/26/21 1413  WBC 6.0 6.2 7.0 5.8  --   NEUTROABS 4.4 4.5 5.2  --   --   HGB 11.9* 11.8* 11.9* 11.2* 11.2*  11.2*  HCT 37.0 36.5 37.1 35.1* 33.0*  33.0*  MCV 97.6 98.6 100.0 100.0  --   PLT 265 269 274 245  --     Medications:     cholecalciferol  1,000 Units Oral Daily   enoxaparin (LOVENOX) injection  40 mg Subcutaneous Daily   midodrine  5 mg Oral TID WC   pantoprazole  40 mg Oral Daily   potassium chloride  40 mEq Oral Once   sodium chloride flush  3 mL Intravenous Q12H   Elmarie Shiley, MD 07/28/2021, 8:29 AM

## 2021-07-29 ENCOUNTER — Inpatient Hospital Stay (HOSPITAL_COMMUNITY): Payer: Managed Care, Other (non HMO)

## 2021-07-29 DIAGNOSIS — R601 Generalized edema: Secondary | ICD-10-CM | POA: Diagnosis not present

## 2021-07-29 DIAGNOSIS — I5033 Acute on chronic diastolic (congestive) heart failure: Secondary | ICD-10-CM | POA: Diagnosis not present

## 2021-07-29 DIAGNOSIS — N184 Chronic kidney disease, stage 4 (severe): Secondary | ICD-10-CM | POA: Diagnosis not present

## 2021-07-29 DIAGNOSIS — N179 Acute kidney failure, unspecified: Secondary | ICD-10-CM | POA: Diagnosis not present

## 2021-07-29 LAB — RENAL FUNCTION PANEL
Albumin: 3.6 g/dL (ref 3.5–5.0)
Anion gap: 13 (ref 5–15)
BUN: 65 mg/dL — ABNORMAL HIGH (ref 6–20)
CO2: 26 mmol/L (ref 22–32)
Calcium: 9.2 mg/dL (ref 8.9–10.3)
Chloride: 95 mmol/L — ABNORMAL LOW (ref 98–111)
Creatinine, Ser: 3.45 mg/dL — ABNORMAL HIGH (ref 0.44–1.00)
GFR, Estimated: 16 mL/min — ABNORMAL LOW (ref >60)
Glucose, Bld: 102 mg/dL — ABNORMAL HIGH (ref 70–99)
Phosphorus: 5.1 mg/dL — ABNORMAL HIGH (ref 2.5–4.6)
Potassium: 3 mmol/L — ABNORMAL LOW (ref 3.5–5.1)
Sodium: 134 mmol/L — ABNORMAL LOW (ref 135–145)

## 2021-07-29 LAB — BASIC METABOLIC PANEL
Anion gap: 14 (ref 5–15)
BUN: 66 mg/dL — ABNORMAL HIGH (ref 6–20)
CO2: 24 mmol/L (ref 22–32)
Calcium: 9.2 mg/dL (ref 8.9–10.3)
Chloride: 96 mmol/L — ABNORMAL LOW (ref 98–111)
Creatinine, Ser: 3.69 mg/dL — ABNORMAL HIGH (ref 0.44–1.00)
GFR, Estimated: 15 mL/min — ABNORMAL LOW (ref >60)
Glucose, Bld: 97 mg/dL (ref 70–99)
Potassium: 2.7 mmol/L — CL (ref 3.5–5.1)
Sodium: 134 mmol/L — ABNORMAL LOW (ref 135–145)

## 2021-07-29 LAB — MAGNESIUM: Magnesium: 1.9 mg/dL (ref 1.7–2.4)

## 2021-07-29 IMAGING — DX DG CHEST 1V PORT
1 series · 1 of 1 positions shown · non-contrast
Comparison: [DATE]

CLINICAL DATA: 45-year-old female status post central line
placement.

EXAM:
PORTABLE CHEST - 1 VIEW

[chest]
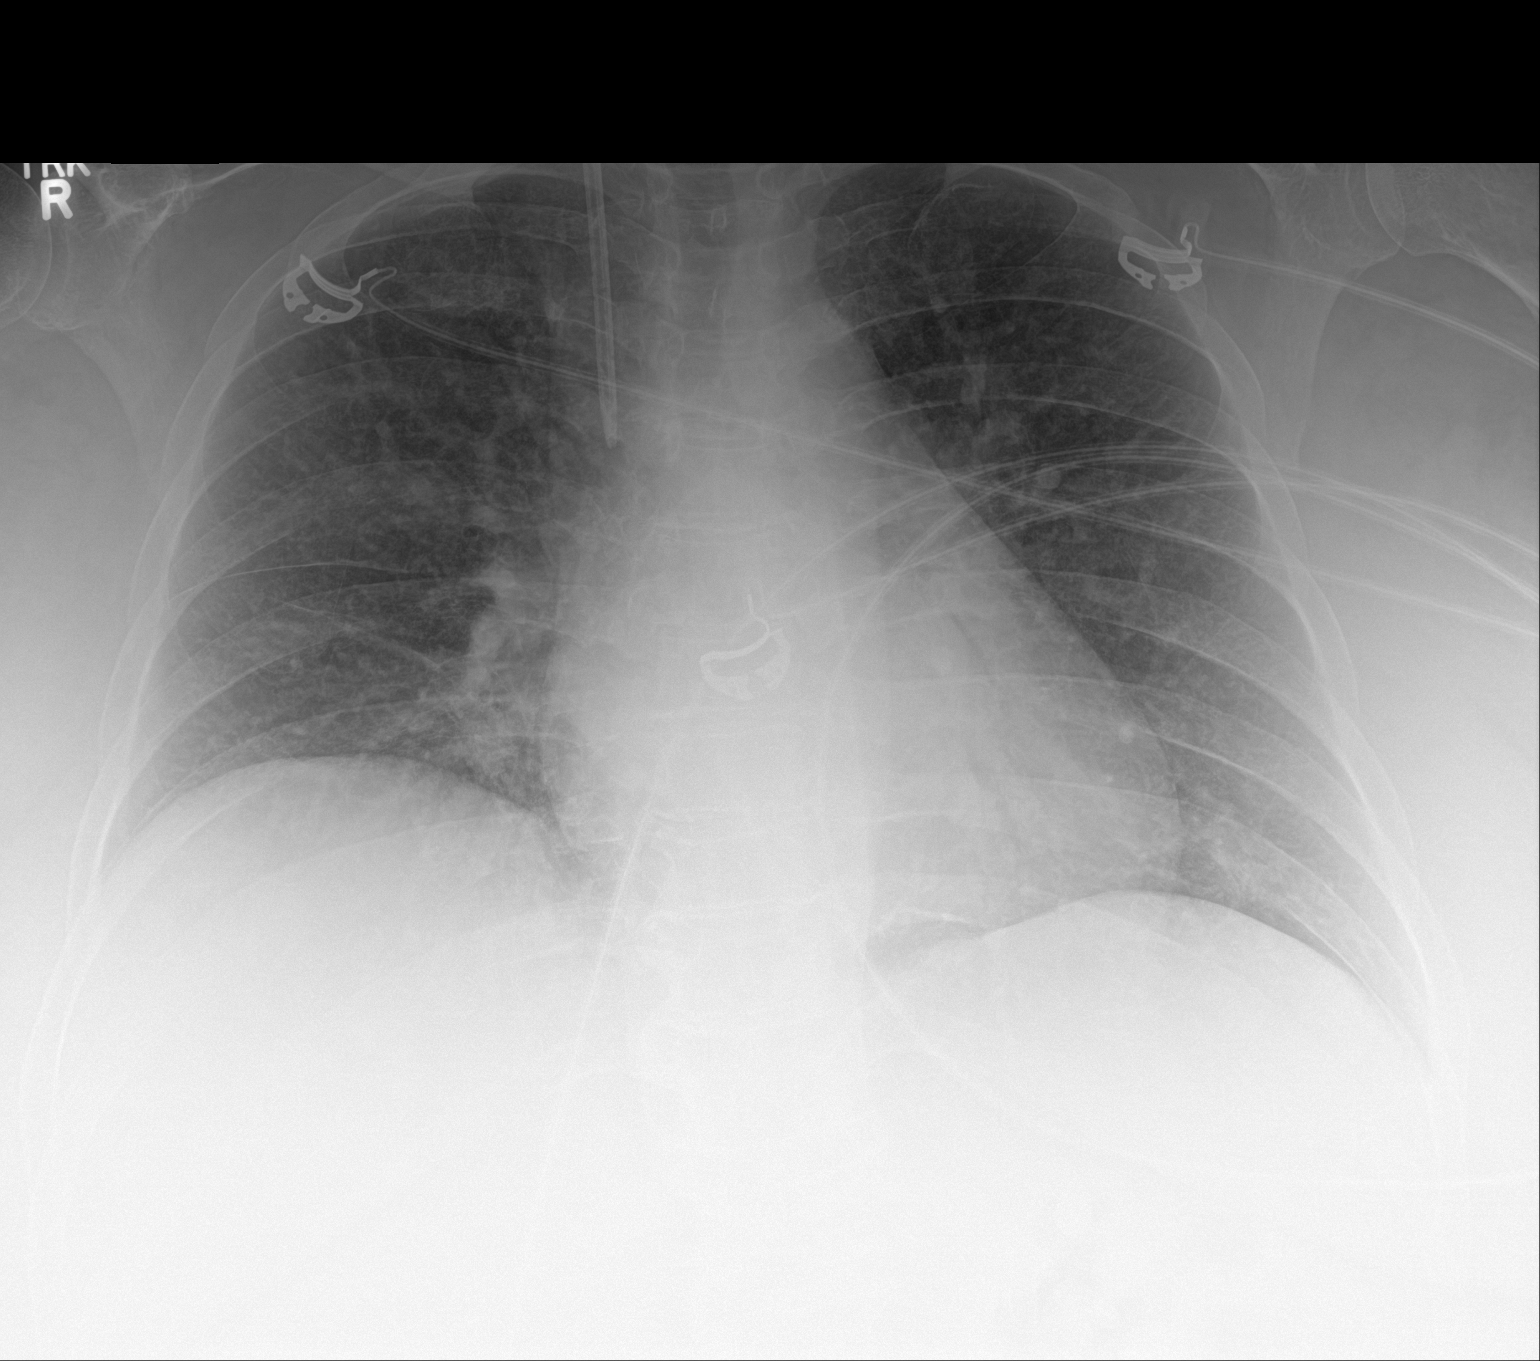

[1 of 1 positions shown; findings below may reference images not displayed]

FINDINGS: The mediastinal contours are within normal limits. No cardiomegaly.
Interval insertion right internal jugular central venous catheter
with the catheter tip located in the superior aspect of the superior
vena cava. The lungs are clear bilaterally without evidence of focal
consolidation, pleural effusion, or pneumothorax. No acute osseous
abnormality.
IMPRESSION: Right IJ central line placement with the catheter tip in the
superior vena cava, no complicating features.

## 2021-07-29 MED ORDER — HEPARIN SODIUM (PORCINE) 5000 UNIT/ML IJ SOLN
5000.0000 [IU] | Freq: Three times a day (TID) | INTRAMUSCULAR | Status: DC
  Administered 2021-07-30 – 2021-08-20 (×63): 5000 [IU] via SUBCUTANEOUS
  Filled 2021-07-29 (×64): qty 1

## 2021-07-29 MED ORDER — HEPARIN SODIUM (PORCINE) 1000 UNIT/ML DIALYSIS
1000.0000 [IU] | INTRAMUSCULAR | Status: DC | PRN
  Administered 2021-08-07 – 2021-08-10 (×3): 2400 [IU] via INTRAVENOUS_CENTRAL
  Filled 2021-07-29: qty 6
  Filled 2021-07-29: qty 2
  Filled 2021-07-29: qty 4
  Filled 2021-07-29: qty 5
  Filled 2021-07-29 (×3): qty 6

## 2021-07-29 MED ORDER — PRISMASOL BGK 4/2.5 32-4-2.5 MEQ/L REPLACEMENT SOLN
Status: DC
  Administered 2021-08-09: 1 via INTRAVENOUS_CENTRAL

## 2021-07-29 MED ORDER — PRISMASOL BGK 4/2.5 32-4-2.5 MEQ/L REPLACEMENT SOLN: Status: DC

## 2021-07-29 MED ORDER — POTASSIUM CHLORIDE 10 MEQ/100ML IV SOLN
10.0000 meq | INTRAVENOUS | Status: AC
  Administered 2021-07-29 (×5): 10 meq via INTRAVENOUS
  Filled 2021-07-29 (×5): qty 100

## 2021-07-29 MED ORDER — SODIUM CHLORIDE 0.9 % IV SOLN
500.0000 [IU]/h | INTRAVENOUS | Status: DC
  Administered 2021-07-29 – 2021-07-31 (×4): 500 [IU]/h via INTRAVENOUS_CENTRAL
  Administered 2021-08-02: 750 [IU]/h via INTRAVENOUS_CENTRAL
  Administered 2021-08-02: 500 [IU]/h via INTRAVENOUS_CENTRAL
  Administered 2021-08-03: 750 [IU]/h via INTRAVENOUS_CENTRAL
  Administered 2021-08-04 (×2): 1000 [IU]/h via INTRAVENOUS_CENTRAL
  Administered 2021-08-04: 750 [IU]/h via INTRAVENOUS_CENTRAL
  Administered 2021-08-05 – 2021-08-10 (×15): 1000 [IU]/h via INTRAVENOUS_CENTRAL
  Filled 2021-07-29 (×5): qty 2
  Filled 2021-07-29: qty 10000
  Filled 2021-07-29: qty 2
  Filled 2021-07-29 (×4): qty 10000
  Filled 2021-07-29 (×4): qty 2
  Filled 2021-07-29: qty 10000
  Filled 2021-07-29 (×2): qty 2
  Filled 2021-07-29 (×2): qty 10000
  Filled 2021-07-29 (×2): qty 2
  Filled 2021-07-29: qty 10000
  Filled 2021-07-29 (×2): qty 2
  Filled 2021-07-29 (×2): qty 10000

## 2021-07-29 MED ORDER — POTASSIUM CHLORIDE CRYS ER 20 MEQ PO TBCR
40.0000 meq | EXTENDED_RELEASE_TABLET | Freq: Once | ORAL | Status: AC
  Administered 2021-07-29: 40 meq via ORAL
  Filled 2021-07-29: qty 2

## 2021-07-29 MED ORDER — MAGNESIUM SULFATE IN D5W 1-5 GM/100ML-% IV SOLN
1.0000 g | Freq: Once | INTRAVENOUS | Status: AC
  Administered 2021-07-29: 1 g via INTRAVENOUS
  Filled 2021-07-29 (×2): qty 100

## 2021-07-29 MED ORDER — PRISMASOL BGK 4/2.5 32-4-2.5 MEQ/L EC SOLN: Status: DC

## 2021-07-29 MED ORDER — CHLORHEXIDINE GLUCONATE CLOTH 2 % EX PADS
6.0000 | MEDICATED_PAD | Freq: Every day | CUTANEOUS | Status: DC
  Administered 2021-07-29 – 2021-08-16 (×19): 6 via TOPICAL

## 2021-07-29 MED ORDER — HEPARIN (PORCINE) 2000 UNITS/L FOR CRRT: INTRAVENOUS_CENTRAL | Status: DC | PRN

## 2021-07-29 MED ORDER — POTASSIUM CHLORIDE 10 MEQ/100ML IV SOLN
10.0000 meq | Freq: Once | INTRAVENOUS | Status: AC
  Administered 2021-07-29: 10 meq via INTRAVENOUS
  Filled 2021-07-29: qty 100

## 2021-07-29 NOTE — Progress Notes (Signed)
Date and time results received: 07/29/21 0615 (use smartphrase ".now" to insert current time)  Test: Serum Potassium Critical Value: 2.7  Name of Provider Notified: Dr. Tonie Griffith  Orders Received? Or Actions Taken?: Orders Received - See Orders for details

## 2021-07-29 NOTE — Progress Notes (Signed)
Advanced Heart Failure Rounding Note  PCP-Cardiologist: Rozann Lesches, MD   Subjective:    [place] 11/22 w/ markedly elevated left and right filling pressure, mod pulmonary venous HTN and preserved CO   Remains on milrinone 0.125. Lasix gtt increased to 30 yesterday. Also receiving diamox and metolazone. Modest urine output at best.  Weight unchanged last 4 days.   Feels bloated. Denies CP, orthopnea or PND. SCr unchanged at 3.6-3.7 range.   [place] 07/26/21  Right Heart Pressures RHC Procedural Findings: Hemodynamics (mmHg) RA mean 35 RV 57/28, 35 PA 58/29, mean 46 PCWP mean 42  Oxygen saturations: PA 70% AO 99%  Cardiac Output (Fick) 7.04  Cardiac Index (Fick) 3.01 PVR < 1 WU PAPI < 1    Objective:   Weight Range: (!) 142.2 kg Body mass index is 55.53 kg/m.   Vital Signs:   Temp:  [97.6 F (36.4 C)-98.4 F (36.9 C)] 97.7 F (36.5 C) (11/25 0611) Pulse Rate:  [90-98] 98 (11/25 0611) Resp:  [17-19] 19 (11/25 0611) BP: (72-104)/(31-82) 100/82 (11/25 0611) SpO2:  [95 %-98 %] 96 % (11/25 0611) Weight:  [142.2 kg] 142.2 kg (11/25 0611) Last BM Date: 07/29/21  Weight change: Filed Weights   07/27/21 0540 07/28/21 0454 07/29/21 0611  Weight: (!) 143.2 kg (!) 142 kg (!) 142.2 kg    Intake/Output:   Intake/Output Summary (Last 24 hours) at 07/29/2021 1214 Last data filed at 07/29/2021 1133 Gross per 24 hour  Intake 2428.87 ml  Output 2175 ml  Net 253.87 ml       Physical Exam    General:  Obese woman lying in bed  Mild dyspnea on talking HEENT: normal Neck: supple. JVP to ear Carotids 2+ bilat; no bruits. No lymphadenopathy or thryomegaly appreciated. Cor: PMI nondisplaced. Regular rate & rhythm. No rubs, gallops or murmurs. Lungs: clear Abdomen: obese soft, nontender, + distended. No hepatosplenomegaly. No bruits or masses. Good bowel sounds. Extremities: no cyanosis, clubbing, rash, 3-4+ edema  + UNNA Neuro: alert & orientedx3, cranial nerves  grossly intact. moves all 4 extremities w/o difficulty. Affect pleasant  Telemetry   NSR 90s Personally reviewed   Labs    CBC Recent Labs    07/26/21 1413  HGB 11.2*  11.2*  HCT 33.0*  33.0*    Basic Metabolic Panel Recent Labs    07/28/21 0155 07/29/21 0452  NA 133* 134*  K 3.1* 2.7*  CL 96* 96*  CO2 26 24  GLUCOSE 103* 97  BUN 63* 66*  CREATININE 3.65* 3.69*  CALCIUM 9.1 9.2  MG 2.0 1.9    Liver Function Tests No results for input(s): AST, ALT, ALKPHOS, BILITOT, PROT, ALBUMIN in the last 72 hours. No results for input(s): LIPASE, AMYLASE in the last 72 hours. Cardiac Enzymes No results for input(s): CKTOTAL, CKMB, CKMBINDEX, TROPONINI in the last 72 hours.  BNP: BNP (last 3 results) Recent Labs    03/04/21 1222 06/23/21 1807 07/22/21 1755  BNP 543.0* 357.0* 510.0*     ProBNP (last 3 results) No results for input(s): PROBNP in the last 8760 hours.   D-Dimer No results for input(s): DDIMER in the last 72 hours. Hemoglobin A1C No results for input(s): HGBA1C in the last 72 hours. Fasting Lipid Panel No results for input(s): CHOL, HDL, LDLCALC, TRIG, CHOLHDL, LDLDIRECT in the last 72 hours. Thyroid Function Tests No results for input(s): TSH, T4TOTAL, T3FREE, THYROIDAB in the last 72 hours.  Invalid input(s): FREET3  Other results:   Imaging  No results found.   Medications:     Scheduled Medications:  cholecalciferol  1,000 Units Oral Daily   metolazone  5 mg Oral Daily   midodrine  5 mg Oral TID WC   pantoprazole  40 mg Oral Daily   sodium chloride flush  3 mL Intravenous Q12H    Infusions:   prismasol BGK 4/2.5      prismasol BGK 4/2.5     furosemide (LASIX) 200 mg in dextrose 5% 100 mL (2mg /mL) infusion 30 mg/hr (07/29/21 1133)   heparin 10,000 units/ 20 mL infusion syringe     magnesium sulfate bolus IVPB     milrinone 0.125 mcg/kg/min (07/29/21 1133)   potassium chloride 50 mL/hr at 07/29/21 1133   prismasol BGK  4/2.5      PRN Medications: camphor-menthol, heparin, heparin, HYDROmorphone (DILAUDID) injection    Assessment/Plan   1. Acute on chronic diastolic CHF: With prominent RV dysfunction.  Echo from 11/22 reviewed, EF 60-65%, normal RV systolic function with mild dilation, D-shaped septum consistent with RV pressure/volume overload. Patient is markedly volume overloaded with anasarca.  Unfortunately, creatinine is now up to 3.68. RHC  w/ markedly elevated left and right filling pressure, mod pulmonary venous HTN and preserved CO. PAPi low at 0.82. Milrinone, midodrine and metolazone added and lasix gtt increased 15-> 20 -> 30. Only modest urine output. Weight unchanged x 4 days. SCr remain 3.6-3.7 - Discussed with Dr. Posey Pronto (Nephrology). I think that CVVHD is the only way we are going to be able to mobilize enough fluid.  - Will move to ICU for CVVHD. (D/w CCM) - Continue milrinone for now. Follow CVP and co-ox once line in.  2. AKI on CKD stage IIIb: Baseline creatinine 1.9-2.3, now up to 3.68 in the setting of severe volume overload.  Suspect cardiorenal syndrome. RHC w/ normal CO. As above, hopefully will stabilize/improve with diuresis and lowering of renal venous pressure. Creatinine stable at 3.6-3.7 today - Nephrology following. Plan for CVVHD today 3. Cirrhosis: Uncertain etiology, labs negative for viral hepatitis.  Not a heavy drinker.  May be due to NAFLD or RV failure.  4. OSA: Continue CPAP.  5. Hyponatremia: NA 134 - restrict FW 6. Morbid obesity - Body mass index is 55.53 kg/m. 7. Hypokalemia - K 2.7. Will supp. Discussed dosing with PharmD personally.    Length of Stay: [place], [place]  07/29/2021, 12:14 PM  Advanced Heart Failure Team Pager [phone] (M-F; 7a - 5p)  Please contact [facility] for night-coverage after hours (5p -7a ) and weekends on [email or web address]

## 2021-07-29 NOTE — Progress Notes (Signed)
Patient ID: Lauren Osborn, female   DOB: [birth year], 45 y.o.   MRN: [id number] [facility] Progress Note   Assessment/ Plan:   1. Acute kidney Injury on chronic kidney disease stage IIIb (baseline creatinine 1.9-2.3): Appears hemodynamically mediated in the setting of acute exacerbation of diastolic heart failure with worsening from ongoing diuresis.  With a lackluster response to diuretics overnight in spite of uptitrating furosemide drip to 30 mg/h in combination with metolazone and ongoing midodrine/milrinone drip.  Renal function essentially unchanged versus slightly worse overnight.  I fear that she will need extracorporeal volume unloading with CRRT in the ICU at this point given unimpressive diuretic response. 2.  Hyponatremia: Secondary to CHF exacerbation/impaired free water deficit and inappropriate ADH activation-I have ordered for 1.2 L/day fluid restriction and will monitor on furosemide drip. 3.  Acute exacerbation of diastolic heart failure: Diuretics transiently held secondary to worsening renal insufficiency, continue fluid restriction at 1.5 L a day.  VQ scan negative for PE and right heart catheterization shows markedly elevated left and right heart filling pressures with moderate pulmonary venous hypertension and preserved cardiac output.  Unimpressive diuretic response with up titration of furosemide drip and ongoing milrinone/midodrine/metolazone-suspect she will need to transfer to the ICU for CRRT.  We will discuss with the advanced heart failure service. 4.  Anemia: Hemoglobin and hematocrit slightly lower-unclear if this is dilutional from CHF exacerbation, no indication for ESA. 5.  Hypokalemia: Secondary to diuretic induced losses, ongoing replacement via IV and oral route.  Subjective:   Denies any chest pain or shortness of breath this morning but still feels distended from anasarca.  Objective:   BP 100/82 (BP Location: Right Arm)   Pulse 98   Temp 97.7  F (36.5 C) (Oral)   Resp 19   Ht 5\' 3"  (1.6 m)   Wt (!) 142.2 kg   SpO2 96%   BMI 55.53 kg/m   Intake/Output Summary (Last 24 hours) at 07/29/2021 0934 Last data filed at 07/29/2021 4259 Gross per 24 hour  Intake 1954.68 ml  Output 2175 ml  Net -220.32 ml   Weight change: 0.179 kg  Physical Exam: Gen: Resting comfortably in bed, speaking on cell phone CVS: Pulse regular rhythm, normal rate, S1 and S2 normal Resp: Poor inspiratory effort with decreased breath sounds over bases, no distinct rales or rhonchi Abd: Soft, obese, nontender Ext: 2+ pitting edema over lower extremities  Imaging: No results found.  Labs: BMET Recent Labs  Lab 07/22/21 1148 07/22/21 1153 07/23/21 0551 07/24/21 0210 07/25/21 0123 07/26/21 0224 07/26/21 1317 07/26/21 1413 07/27/21 0226 07/28/21 0155 07/29/21 0452  NA 136   < > 136 135 134* 133* 132* 135  135 133* 133* 134*  K 4.6   < > 5.0 4.3 4.6 4.2 4.1 4.0  3.9 3.6 3.1* 2.7*  CL 97*   < > 99 99 97* 96* 95*  --  96* 96* 96*  CO2 27   < > 26 26 26 24 24   --  23 26 24   GLUCOSE 92   < > 93 97 97 91 87  --  93 103* 97  BUN 44*   < > 46* 44* 49* 57* 58*  --  60* 63* 66*  CREATININE 2.76*   < > 2.84* 3.21* 3.47* 3.58* 3.62*  --  3.68* 3.65* 3.69*  CALCIUM 9.6   < > 9.2 9.3 9.2 9.4 9.3  --  9.3 9.1 9.2  PHOS 4.1  --  4.4  --   --   --   --   --   --   --   --    < > =  values in this interval not displayed.   CBC Recent Labs  Lab 07/22/21 1144 07/22/21 1153 07/22/21 1755 07/23/21 0551 07/26/21 1413  WBC 6.0 6.2 7.0 5.8  --   NEUTROABS 4.4 4.5 5.2  --   --   HGB 11.9* 11.8* 11.9* 11.2* 11.2*  11.2*  HCT 37.0 36.5 37.1 35.1* 33.0*  33.0*  MCV 97.6 98.6 100.0 100.0  --   PLT 265 269 274 245  --     Medications:     cholecalciferol  1,000 Units Oral Daily   enoxaparin (LOVENOX) injection  40 mg Subcutaneous Daily   metolazone  5 mg Oral Daily   midodrine  5 mg Oral TID WC   pantoprazole  40 mg Oral Daily   potassium chloride   40 mEq Oral Once   sodium chloride flush  3 mL Intravenous Q12H   Elmarie Shiley, MD 07/29/2021, 9:34 AM

## 2021-07-29 NOTE — Consult Note (Addendum)
NAME:  Lauren Osborn, MRN:  [id number], DOB:  March 13, 1976, LOS: 7 ADMISSION DATE:  07/22/2021, CONSULTATION DATE:  11/25 REFERRING MD:  Dr. Allena Katz, CHIEF COMPLAINT:  Acute renal injury requiring CRRT    History of Present Illness:  Lauren Osborn is a 45 y.o. female with a PMH significant for CKD stage 3b, HFpEF, HTN, obesity, OSA, and GERD who presented originally 11/18 due to worsening SOB, lower extremity edema, ABD distension, and weight gain.(20-30lbs) In ED she was mildly hypotensive but otherwise hemodynamically stable. She was admitted per [facility] for management of acute exacerbation of HFpEF  RHC 11/22 revealed markedly elevated left and right filling pressure, mod pulmonary venous HTN and preserved CO. Inotropic support and aggressive diuresing added.  Morning of 11/25 patient continues to remain significantly volume overloaded with worsening AKI resulting in need to transfer to ICU for CRRT initiation. PCCM consulted.  Pertinent  Medical History  CKD stage 3b, HFpEF, HTN, obesity, OSA, and GERD   Significant Hospital Events: Including procedures, antibiotic start and stop dates in addition to other pertinent events   11/18 admitted for worsening SOB, lower extremity edema, ABD distension, and weight gain.(20-30lbs) 11/22 RHC revealed markedly elevated left and right filling pressure, mod pulmonary venous HTN and preserved CO. Inotropic support and aggressive diuresing added. 11/25 PCCM consulted for assistance in management and need ofr access to start CRRT   Interim History / Subjective:  As above   Objective   Blood pressure 100/82, pulse 98, temperature 97.7 F (36.5 C), temperature source Oral, resp. rate 19, height 5\' 3"  (1.6 m), weight (!) 142.2 kg, SpO2 96 %.        Intake/Output Summary (Last 24 hours) at 07/29/2021 1104 Last data filed at 07/29/2021 07/31/2021 Gross per 24 hour  Intake 1954.68 ml  Output 2175 ml  Net -220.32 ml   Filed Weights   07/27/21 0540 07/28/21  0454 07/29/21 0611  Weight: (!) 143.2 kg (!) 142 kg (!) 142.2 kg    Examination: General: Very pleasant adult female sitting up in bedside recline in no acute distress HEENT: [place]/AT, MM pink/moist, PERRL,  Neuro: Alert and oriented x3 CV: s1s2 regular rate and rhythm, no murmur, rubs, or gallops,  PULM:  Diminished bilateral, no increased work of breathing GI: soft, bowel sounds active in all 4 quadrants, non-tender, non-distended, tolerating oral diet  Extremities: warm/dry, massive generalized edema/anasarca   Skin: no rashes or lesions  Resolved Hospital Problem list     Assessment & Plan:  Acute on chronic diastolic congestive heart failure with acute exacerbation  -ECHO 11/19 EF 60-65%  -RHC revealed markedly elevated left and right filling pressure, mod pulmonary venous HTN and preserved CO. Anasarca  Hypertension  P: Will need CRRT started to assist in volume control  Heart failure follow, appreciate assistance  Continue inotrope's per heart failure   Heart health diet with sodium restriction when able  Strict intake and output  Daily weight to assess volume status Closely monitor renal function and electrolytes  Ensure hemodynamic control  Home medications on hold  GDMT as able   Acute Kidney Injury superimposed on CKD stage 3b -Baseline creatinine 1.9-2.3, creatinine 11/25 3.68. Likely multifactorial including cardiorenal syndrome, contrast, and hemodynamic swings P: Nephrology following, appreciate assistance  Volume removal per CRRT  Place HD cath on arrival to ICU  Follow renal function  Monitor urine output Trend Bmet Avoid nephrotoxins Ensure adequate renal perfusion   HX of OSA on CPAP P: Continue CPAP at  HS  Head of bed elevated 30 degrees. Follow intermittent chest x-ray and ABG Ensure adequate pulmonary hygiene   Right upper quadrant abdominal pain  -Likely secondary to volume overload in the setting of CHF -Cholelithiasis without cholecystitis  on abdominal ultrasound.  Murphy sign negative on physical exam Hepatic cirrhosis  P: Volume removal as above Supportive care  Avoid hepatotoxins   Hyponatremia  P: Trend Bmet   Hypokalemia  P: Supplement  Trend Bmet     Best Practice (right click and "Reselect all SmartList Selections" daily)   Diet/type: Regular consistency (see orders) DVT prophylaxis: prophylactic heparin  GI prophylaxis: PPI Lines: Central line Foley:  N/A Code Status:  full code Last date of multidisciplinary goals of care discussion: Per primary   Labs   CBC: Recent Labs  Lab 07/22/21 1144 07/22/21 1153 07/22/21 1755 07/23/21 0551 07/26/21 1413  WBC 6.0 6.2 7.0 5.8  --   NEUTROABS 4.4 4.5 5.2  --   --   HGB 11.9* 11.8* 11.9* 11.2* 11.2*  11.2*  HCT 37.0 36.5 37.1 35.1* 33.0*  33.0*  MCV 97.6 98.6 100.0 100.0  --   PLT 265 269 274 245  --     Basic Metabolic Panel: Recent Labs  Lab 07/22/21 1148 07/22/21 1153 07/23/21 0551 07/24/21 0210 07/26/21 0224 07/26/21 1317 07/26/21 1413 07/27/21 0226 07/28/21 0155 07/29/21 0452  NA 136   < > 136   < > 133* 132* 135  135 133* 133* 134*  K 4.6   < > 5.0   < > 4.2 4.1 4.0  3.9 3.6 3.1* 2.7*  CL 97*   < > 99   < > 96* 95*  --  96* 96* 96*  CO2 27   < > 26   < > 24 24  --  $R'23 26 24  'sr$ GLUCOSE 92   < > 93   < > 91 87  --  93 103* 97  BUN 44*   < > 46*   < > 57* 58*  --  60* 63* 66*  CREATININE 2.76*   < > 2.84*   < > 3.58* 3.62*  --  3.68* 3.65* 3.69*  CALCIUM 9.6   < > 9.2   < > 9.4 9.3  --  9.3 9.1 9.2  MG  --   --  2.0  --   --  2.1  --  2.1 2.0 1.9  PHOS 4.1  --  4.4  --   --   --   --   --   --   --    < > = values in this interval not displayed.   GFR: Estimated Creatinine Clearance: 26.3 mL/min (A) (by C-G formula based on SCr of 3.69 mg/dL (H)). Recent Labs  Lab 07/22/21 1144 07/22/21 1153 07/22/21 1755 07/23/21 0551  WBC 6.0 6.2 7.0 5.8    Liver Function Tests: Recent Labs  Lab 07/22/21 1144 07/22/21 1148  07/22/21 1153 07/22/21 1755 07/23/21 0551  AST 38  --  38 40 34  ALT 18  --  $R'18 18 16  'cj$ ALKPHOS 84  --  89 88 79  BILITOT 2.7*  --  2.7* 2.4* 2.5*  PROT 7.8  --  7.9 7.7 7.2  ALBUMIN 4.1 4.0 4.1 4.0 3.7   Recent Labs  Lab 07/22/21 1144 07/22/21 1755  LIPASE 41 46   No results for input(s): AMMONIA in the last 168 hours.  ABG    Component Value Date/Time  HCO3 26.2 07/26/2021 1413   HCO3 26.3 07/26/2021 1413   TCO2 27 07/26/2021 1413   TCO2 28 07/26/2021 1413   O2SAT 70.0 07/26/2021 1413   O2SAT 71.0 07/26/2021 1413     Coagulation Profile: Recent Labs  Lab 07/22/21 1755  INR 1.2    Cardiac Enzymes: No results for input(s): CKTOTAL, CKMB, CKMBINDEX, TROPONINI in the last 168 hours.  HbA1C: No results found for: HGBA1C  CBG: No results for input(s): GLUCAP in the last 168 hours.  Review of Systems:   Please see the history of present illness. All other systems reviewed and are negative   Past Medical History:  She,  has a past medical history of Abnormal bilirubin test, Cholelithiasis, Chronic kidney disease, stage 3b ([place]), Diastolic congestive heart failure ([place]), Esophagitis, Essential hypertension, Fibroids, Gastritis, GERD (gastroesophageal reflux disease), Gout, Liver hemangioma, Morbid obesity ([place]), Normocytic anemia, OSA (obstructive sleep apnea), Thyroid nodule, and Tubular adenoma.   Surgical History:   Past Surgical History:  Procedure Laterality Date   BIOPSY  08/03/2020   Procedure: BIOPSY;  Surgeon: Eloise Harman, DO;  [facility];  Service: Endoscopy;;  duodenal gastric   CESAREAN SECTION     COLONOSCOPY WITH PROPOFOL N/A 08/03/2020    Surgeon: Hurshel Keys K, DO; 5 mm tubular adenoma in the sigmoid colon, nonbleeding internal hemorrhoids.  Recommended repeat in 5 years.   ESOPHAGOGASTRODUODENOSCOPY (EGD) WITH PROPOFOL N/A 08/03/2020   Surgeon: Eloise Harman, DO; LA grade C esophagitis without bleeding, gastritis with  erythema and shallow ulcerations in gastric antrum biopsied (negative for H. pylori), normal examined duodenum s/p biopsy (benign).   HYSTERECTOMY ABDOMINAL WITH SALPINGECTOMY  2008   POLYPECTOMY  08/03/2020   Procedure: POLYPECTOMY;  Surgeon: Eloise Harman, DO;  Location: [facility];  Service: Endoscopy;;  colon   RIGHT HEART CATH N/A 07/26/2021   Procedure: RIGHT HEART CATH;  Surgeon: Larey Dresser, MD;  Location: [facility];  Service: Cardiovascular;  Laterality: N/A;   THYROIDECTOMY Right 10/21/2018   Procedure: RIGHT HEMITHYROIDECTOMY;  Surgeon: Leta Baptist, MD;  Location: [facility];  Service: ENT;  Laterality: Right;     Social History:   reports that she quit smoking about 5 years ago. Her smoking use included cigarettes. She has a 10.00 pack-year smoking history. She has never used smokeless tobacco. She reports that she does not currently use alcohol. She reports that she does not use drugs.   Family History:  Her family history includes Breast cancer in her mother; Colon cancer (age of onset: 77) in her father; Colon polyps (age of onset: 67) in her sister; Congestive Heart Failure in her maternal grandfather; Diabetes in her daughter; Diverticulitis in her brother and sister; Other in her maternal grandmother and paternal grandfather.   Allergies Allergies  Allergen Reactions   Oxycodone-Acetaminophen Hives and Other (See Comments)     Home Medications  Prior to Admission medications   Medication Sig Start Date End Date Taking? Authorizing Provider  acetaminophen (TYLENOL) 500 MG tablet Take 1,000 mg by mouth every 6 (six) hours as needed for moderate pain or headache.   Yes [provider]  magnesium oxide (MAG-OX) 400 MG tablet TAKE 1 TABLET BY MOUTH ONCE DAILY. 01/17/21  Yes Satira Sark, MD  metolazone (ZAROXOLYN) 2.5 MG tablet Take 1 tablet (2.5 mg total) by mouth See admin instructions. 2.5 mg every M W F 06/28/21  Yes Barton Dubois, MD  omeprazole ([place]) 20  MG capsule Take 1 capsule (20 mg total) by mouth daily before breakfast. 07/22/21 01/18/22 Yes Harper, Tivis Ringer, PA-C  potassium chloride SA (KLOR-CON) 20 MEQ tablet Take 3 tablets (60 mEq total) by mouth 3 (three) times daily. Take 2 additional Tablets on the Days That you take Metolazone 07/15/21  Yes Strader, Fate, PA-C  spironolactone (ALDACTONE) 25 MG tablet Take 1 tablet (25 mg total) by mouth daily. 06/28/21  Yes Barton Dubois, MD  TART CHERRY PO Take by mouth daily.   Yes [provider]  torsemide (DEMADEX) 20 MG tablet Take 4 tablets in am and 3 tablets in the evening. 06/28/21  Yes Barton Dubois, MD     Critical care time:   Performed by: Whitney D. Harris  Total critical care time: 42 minutes  Critical care time was exclusive of separately billable procedures and treating other patients.  Critical care was necessary to treat or prevent imminent or life-threatening deterioration.  Critical care was time spent personally by me on the following activities: development of treatment plan with patient and/or surrogate as well as nursing, discussions with consultants, evaluation of patient's response to treatment, examination of patient, obtaining history from patient or surrogate, ordering and performing treatments and interventions, ordering and review of laboratory studies, ordering and review of radiographic studies, pulse oximetry and re-evaluation of patient's condition.  Whitney D. Kenton Kingfisher, NP-C [facility] Personal contact information can be found on Amion  07/29/2021, 11:44 AM    PCCM attending:   45 yo FM, RV failure, anasarca, increased swelling, weight gain   BP 111/77   Pulse 93   Temp (!) 97.5 F (36.4 C) (Oral)   Resp (!) 21   Ht $R'5\' 3"'HN$  (1.6 m)   Wt (!) 142.2 kg   SpO2 100%   BMI 55.53 kg/m   Gen: obese fm, resting in chair, she cant lay flat  HENT: tracking appropriately  Lungs:  CTAB no wheeze  Heart: RRR, s1 s2   Labs: reviewed   A:  Acute on Chronic Diastolic Heart Failure Right ventricular failure  Anasarca  AKI on CKD  OSA on CPAP   P: Transfer to the ICU for CVVHD  Plan for hd cath placement  Patient agrees  Check BMET Follow kidney function and replete lytes as needed   I met with patients family at bedside as well   This patient is critically ill with multiple organ system failure; which, requires frequent high complexity decision making, assessment, support, evaluation, and titration of therapies. This was completed through the application of advanced monitoring technologies and extensive interpretation of multiple databases. During this encounter critical care time was devoted to patient care services described in this note for 42 minutes.   Garner Nash, DO [facility] 07/29/2021 3:20 PM

## 2021-07-29 NOTE — Progress Notes (Signed)
[facility] PROGRESS NOTE    Progress Note  Lauren Osborn  VOH:[id number] DOB: Jul 24, 1976 DOA: 07/22/2021 PCP: [name], MD     Brief Narrative:   Lauren Osborn is an 45 y.o. female past medical history significant for essential hypertension, hepatic cirrhosis, severe morbid obesity, severe Obstructive Sleep Apnea on nightly CPAP, chronic kidney disease stage 3B who presents to the emergency room due to shortness of breath and increased lower extremity swelling along with abdominal distention and weight gain for about 2 weeks of about 20 to 30 pounds.  She has also been complaining of 1 week of right upper quadrant abdominal pain.  [place] 11/22 with markedly elevated left and right heart filling pressures, moderate pulmonary venous hypertension and preserved cardiac output.  Treating for severe LV diastolic dysfunction with RV failure, is on IV Lasix and milrinone drip.  Cardiology/AHF team and nephrology consulted.  11/25: Despite aggressive diuretics with IV Lasix at 30 mg/h, milrinone drip, Diamox and metolazone, moderate urine output, unchanged weight over prior several days, as per AHF team and nephrology recommendations, patient was transferred to ICU for placement of HD catheter and CVVHD for fluid mobilization.  PCCM was consulted.  [facility] will sign off at this time.    Assessment/Plan:   Anasarca due to acute on chronic diastolic CHF/right heart failure - She was massively fluid overloaded with edema up to her rib cage. - Underlying cirrhosis and pulmonary hypertension contributing. - 2D echo 11/22: LVEF 60 to 65%, normal RV systolic function with mild dilatation, D-shaped septum consistent with RV pressure/volume overload. - [place] 11/22 with marked elevated left and right filling pressure, moderate pulmonary venous hypertension and preserved cardiac output. -VQ scan 11/21: No PE. - Continue bilateral lower extremity Unna boots. - 11/25: Despite aggressive diuretics with IV  Lasix at 30 mg/h, milrinone drip, Diamox and metolazone, moderate urine output, unchanged weight over prior several days, as per AHF team and nephrology recommendations, patient was transferred to ICU for placement of HD catheter and CVVHD for fluid mobilization.  PCCM was consulted.  TR[facility] will sign off at this time.  Hypokalemia - Potassium 2.7.  Replacing aggressively p.o. and IV.  Magnesium 1.9. - Follow BMP in AM.  Right upper quadrant abdominal pain: - Cholelithiasis without cholecystitis on abdominal ultrasound.  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 -May be due to passive hepatic congestion from right heart failure.  Hepatic cirrhosis: - Aldactone held, LFTs unremarkable.  History of hepatic hemangioma: - Confirmed by MRI about the same size.  Outpatient follow-up.  Essential hypertension: - Her blood pressure is soft.  Started on midodrine. - Continue to hold her antihypertensive medication outside of her diuretics.  Lower extremity swelling: - Lower extremity Doppler negative for DVT.  Body mass index is 55.53 kg/m./Severe morbid obesity: - Counseling probably contributing to a lot of her problems.  OSA on CPAP - continue  Acute kidney injury complicating chronic kidney disease stage 3B - Nephrology following.  Baseline creatinine 1.9-2.3 -AKI suspected hemodynamically mediated in setting of decompensated CHF and diuresis. -Creatinine has plateaued in the 3.5-3.6 range for the last 5 days.  Monitor closely while on aggressive IV diuresis.  Hyponatremia -Likely due to hypervolemia and CKD. - Continue Lasix and follow BMP.  Stable.  GERD: - Continue PPI.  Anemia of chronic renal disease: - Most likely due to renal failure no signs of  overt bleeding.  Continue to monitor creatinine intermittently.  DVT prophylaxis: lovenox CODE STATUS: Full Family  Communication: None at bedside. Status is: Inpatient  Remains inpatient appropriate because: Acute systolic heart failure with abdominal pain.    Procedures and diagnostic studies:   No results found.   Medical Consultants:   Cardiology Nephrology PCCM   Subjective:    Seen this morning prior to transfer.  Reports no significant change in swelling of her arms and legs or urine output.  No dyspnea while in bed.  No chest pain reported.  Tired because did not sleep well last night and got 4 hours of sleep while on CPAP.  Objective:    Vitals:   07/28/21 1500 07/28/21 1519 07/28/21 2046 07/29/21 0611  BP: (!) 72/31 93/62 100/61 100/82  Pulse: 93  90 98  Resp: 17  18 19   Temp: 97.6 F (36.4 C)  98.4 F (36.9 C) 97.7 F (36.5 C)  TempSrc: Oral  Oral Oral  SpO2: 98%  95% 96%  Weight:    (!) 142.2 kg  Height:       SpO2: 96 % O2 Flow Rate (L/min): 2 L/min FiO2 (%): 21 %   Intake/Output Summary (Last 24 hours) at 07/29/2021 1349 Last data filed at 07/29/2021 1300 Gross per 24 hour  Intake 2248.87 ml  Output 2275 ml  Net -26.13 ml   Filed Weights   07/27/21 0540 07/28/21 0454 07/29/21 0611  Weight: (!) 143.2 kg (!) 142 kg (!) 142.2 kg    Exam: General exam: Young female, moderately built and morbidly obese lying comfortably propped up in bed. Respiratory system: Clear to auscultation.  No increased work of breathing. Cardiovascular system: S1 and S2 heard, RRR.  No JVD, murmurs.  Telemetry personally reviewed: Sinus rhythm.  Has anasarca with 3+ pitting bilateral leg edema going up to anterior abdominal wall.  Also has trace pitting bilateral forearm edema.  Really no significant change in her edema over the last couple of days. Gastrointestinal system: Abdomen is nondistended but obese, soft and nontender.  Extremities: 3+ edema.  Has bilateral lower extremity Unna boots. Psychiatry: Judgement and insight appear normal. Mood & affect appropriate.   Data  Reviewed:    Labs: Basic Metabolic Panel: Recent Labs  Lab 07/23/21 0551 07/24/21 0210 07/26/21 0224 07/26/21 1317 07/26/21 1413 07/27/21 0226 07/28/21 0155 07/29/21 0452  NA 136   < > 133* 132* 135  135 133* 133* 134*  K 5.0   < > 4.2 4.1 4.0  3.9 3.6 3.1* 2.7*  CL 99   < > 96* 95*  --  96* 96* 96*  CO2 26   < > 24 24  --  23 26 24   GLUCOSE 93   < > 91 87  --  93 103* 97  BUN 46*   < > 57* 58*  --  60* 63* 66*  CREATININE 2.84*   < > 3.58* 3.62*  --  3.68* 3.65* 3.69*  CALCIUM 9.2   < > 9.4 9.3  --  9.3 9.1 9.2  MG 2.0  --   --  2.1  --  2.1 2.0 1.9  PHOS 4.4  --   --   --   --   --   --   --    < > = values in this interval not displayed.   GFR Estimated Creatinine Clearance: 26.3 mL/min (A) (by C-G formula based on SCr of 3.69 mg/dL (H)). Liver Function Tests: Recent  Labs  Lab 07/22/21 1755 07/23/21 0551  AST 40 34  ALT 18 16  ALKPHOS 88 79  BILITOT 2.4* 2.5*  PROT 7.7 7.2  ALBUMIN 4.0 3.7   Recent Labs  Lab 07/22/21 1755  LIPASE 46    Coagulation profile Recent Labs  Lab 07/22/21 1755  INR 1.2     Lab Results  Component Value Date   SARSCOV2NAA NEGATIVE 07/22/2021   SARSCOV2NAA NEGATIVE 06/23/2021   SARSCOV2NAA NEGATIVE 09/15/2020   [place] NEGATIVE 08/02/2020    CBC: Recent Labs  Lab 07/22/21 1755 07/23/21 0551 07/26/21 1413  WBC 7.0 5.8  --   NEUTROABS 5.2  --   --   HGB 11.9* 11.2* 11.2*  11.2*  HCT 37.1 35.1* 33.0*  33.0*  MCV 100.0 100.0  --   PLT 274 245  --     Microbiology Recent Results (from the past 240 hour(s))  Resp Panel by RT-PCR (Flu A&B, Covid) Nasopharyngeal Swab     Status: None   Collection Time: 07/22/21  9:54 PM   Specimen: Nasopharyngeal Swab; Nasopharyngeal(NP) swabs in vial transport medium  Result Value Ref Range Status   SARS Coronavirus 2 by RT PCR NEGATIVE NEGATIVE Final    Comment: (NOTE) SARS-CoV-2 target nucleic acids are NOT DETECTED.  The SARS-CoV-2 RNA is generally detectable in upper  respiratory specimens during the acute phase of infection. The lowest concentration of SARS-CoV-2 viral copies this assay can detect is 138 copies/mL. A negative result does not preclude SARS-Cov-2 infection and should not be used as the sole basis for treatment or other patient management decisions. A negative result may occur with  improper specimen collection/handling, submission of specimen other than nasopharyngeal swab, presence of viral mutation(s) within the areas targeted by this assay, and inadequate number of viral copies(<138 copies/mL). A negative result must be combined with clinical observations, patient history, and epidemiological information. The expected result is Negative.  Fact Sheet for Patients:  EntrepreneurPulse.com.au  Fact Sheet for Healthcare Providers:  IncredibleEmployment.be  This test is no t yet approved or cleared by the [place] FDA and  has been authorized for detection and/or diagnosis of SARS-CoV-2 by FDA under an Emergency Use Authorization (EUA). This EUA will remain  in effect (meaning this test can be used) for the duration of the COVID-19 declaration under Section 564(b)(1) of the Act, 21 U.S.C.section 360bbb-3(b)(1), unless the authorization is terminated  or revoked sooner.       Influenza A by PCR NEGATIVE NEGATIVE Final   Influenza B by PCR NEGATIVE NEGATIVE Final    Comment: (NOTE) The Xpert Xpress SARS-CoV-2/FLU/RSV plus assay is intended as an aid in the diagnosis of influenza from Nasopharyngeal swab specimens and should not be used as a sole basis for treatment. Nasal washings and aspirates are unacceptable for Xpert Xpress SARS-CoV-2/FLU/RSV testing.  Fact Sheet for Patients: EntrepreneurPulse.com.au  Fact Sheet for Healthcare Providers: IncredibleEmployment.be  This test is not yet approved or cleared by the [place] FDA and has been  authorized for detection and/or diagnosis of SARS-CoV-2 by FDA under an Emergency Use Authorization (EUA). This EUA will remain in effect (meaning this test can be used) for the duration of the COVID-19 declaration under Section 564(b)(1) of the Act, 21 U.S.C. section 360bbb-3(b)(1), unless the authorization is terminated or revoked.  Performed at [facility], [address], [place],  [address]      Medications:    cholecalciferol  1,000 Units Oral Daily   metolazone  5 mg  Oral Daily   midodrine  5 mg Oral TID WC   pantoprazole  40 mg Oral Daily   sodium chloride flush  3 mL Intravenous Q12H   Continuous Infusions:   prismasol BGK 4/2.5      prismasol BGK 4/2.5     furosemide (LASIX) 200 mg in dextrose 5% 100 mL (2mg /mL) infusion 30 mg/hr (07/29/21 1133)   heparin 10,000 units/ 20 mL infusion syringe     magnesium sulfate bolus IVPB     milrinone 0.125 mcg/kg/min (07/29/21 1133)   prismasol BGK 4/2.5        LOS: 7 days   Vernell Leep, MD,  FACP, [facility], [facility], [facility] (Care Management Physician Certified) [place]  To contact the attending provider between 7A-7P or the covering provider during after hours 7P-7A, please log into the web site [email or web address] and access using universal [place] password for that web site. If you do not have the password, please call the hospital operator.

## 2021-07-29 NOTE — Progress Notes (Signed)
Report given to [facility] RN. Patient will be transferred to 2H05.

## 2021-07-29 NOTE — Progress Notes (Signed)
Mobility Specialist Progress Note    07/29/21 1207  Mobility  Activity Contraindicated/medical hold   RN advised to hold off as pt will be leaving unit.   [facility] Mobility Specialist  M.S. Primary Phone: [phone] M.S. Secondary Phone: [phone]

## 2021-07-29 NOTE — Procedures (Signed)
Central Venous Catheter Insertion Procedure Note  Lauren Osborn  [id number]  [birth year]  Date:07/29/21  Time:3:43 PM   Provider Performing:Azaiah Licciardi   Procedure: Insertion of Non-tunneled Central Venous Catheter(36556)with US guidance (81275)    Indication(s) Hemodialysis  Consent Risks of the procedure as well as the alternatives and risks of each were explained to the patient and/or caregiver.  Consent for the procedure was obtained and is signed in the bedside chart  Anesthesia Topical only with 1% lidocaine   Timeout Verified patient identification, verified procedure, site/side was marked, verified correct patient position, special equipment/implants available, medications/allergies/relevant history reviewed, required imaging and test results available.  Sterile Technique Maximal sterile technique including full sterile barrier drape, hand hygiene, sterile gown, sterile gloves, mask, hair covering, sterile ultrasound probe cover (if used).  Procedure Description Area of catheter insertion was cleaned with chlorhexidine and draped in sterile fashion.   With real-time ultrasound guidance a HD catheter was placed into the right internal jugular vein.  Nonpulsatile blood flow and easy flushing noted in all ports.  The catheter was sutured in place and sterile dressing applied.  Complications/Tolerance None; patient tolerated the procedure well. Chest X-ray is ordered to verify placement for internal jugular or subclavian cannulation.  Chest x-ray is not ordered for femoral cannulation.  EBL Minimal  Specimen(s) None

## 2021-07-30 DIAGNOSIS — N179 Acute kidney failure, unspecified: Secondary | ICD-10-CM | POA: Diagnosis not present

## 2021-07-30 DIAGNOSIS — R0602 Shortness of breath: Secondary | ICD-10-CM | POA: Diagnosis not present

## 2021-07-30 DIAGNOSIS — K7460 Unspecified cirrhosis of liver: Secondary | ICD-10-CM | POA: Diagnosis not present

## 2021-07-30 DIAGNOSIS — R601 Generalized edema: Secondary | ICD-10-CM | POA: Diagnosis not present

## 2021-07-30 DIAGNOSIS — N183 Chronic kidney disease, stage 3 unspecified: Secondary | ICD-10-CM | POA: Diagnosis not present

## 2021-07-30 DIAGNOSIS — I5033 Acute on chronic diastolic (congestive) heart failure: Secondary | ICD-10-CM | POA: Diagnosis not present

## 2021-07-30 DIAGNOSIS — Z7189 Other specified counseling: Secondary | ICD-10-CM | POA: Diagnosis not present

## 2021-07-30 LAB — RENAL FUNCTION PANEL
Albumin: 3.7 g/dL (ref 3.5–5.0)
Albumin: 3.6 g/dL (ref 3.5–5.0)
Anion gap: 11 (ref 5–15)
Anion gap: 10 (ref 5–15)
BUN: 51 mg/dL — ABNORMAL HIGH (ref 6–20)
BUN: 43 mg/dL — ABNORMAL HIGH (ref 6–20)
CO2: 27 mmol/L (ref 22–32)
CO2: 26 mmol/L (ref 22–32)
Calcium: 9.1 mg/dL (ref 8.9–10.3)
Calcium: 9.1 mg/dL (ref 8.9–10.3)
Chloride: 95 mmol/L — ABNORMAL LOW (ref 98–111)
Chloride: 97 mmol/L — ABNORMAL LOW (ref 98–111)
Creatinine, Ser: 2.9 mg/dL — ABNORMAL HIGH (ref 0.44–1.00)
Creatinine, Ser: 2.44 mg/dL — ABNORMAL HIGH (ref 0.44–1.00)
GFR, Estimated: 19 mL/min — ABNORMAL LOW (ref >60)
GFR, Estimated: 24 mL/min — ABNORMAL LOW (ref >60)
Glucose, Bld: 93 mg/dL (ref 70–99)
Glucose, Bld: 106 mg/dL — ABNORMAL HIGH (ref 70–99)
Phosphorus: 4.3 mg/dL (ref 2.5–4.6)
Phosphorus: 3.6 mg/dL (ref 2.5–4.6)
Potassium: 3 mmol/L — ABNORMAL LOW (ref 3.5–5.1)
Potassium: 3.3 mmol/L — ABNORMAL LOW (ref 3.5–5.1)
Sodium: 133 mmol/L — ABNORMAL LOW (ref 135–145)
Sodium: 133 mmol/L — ABNORMAL LOW (ref 135–145)

## 2021-07-30 LAB — COOXEMETRY PANEL
Carboxyhemoglobin: 1.5 % (ref 0.5–1.5)
Methemoglobin: 0.9 % (ref 0.0–1.5)
O2 Saturation: 64.6 %
Total hemoglobin: 11 g/dL — ABNORMAL LOW (ref 12.0–16.0)

## 2021-07-30 LAB — MAGNESIUM: Magnesium: 2.1 mg/dL (ref 1.7–2.4)

## 2021-07-30 MED ORDER — POTASSIUM CHLORIDE CRYS ER 20 MEQ PO TBCR
30.0000 meq | EXTENDED_RELEASE_TABLET | Freq: Three times a day (TID) | ORAL | Status: AC
  Administered 2021-07-30 (×3): 30 meq via ORAL
  Filled 2021-07-30 (×3): qty 1

## 2021-07-30 NOTE — Progress Notes (Signed)
Patient ID: Lauren Osborn, female   DOB: 05-Jun-1976, 45 y.o.   MRN: [id number]     Advanced Heart Failure Rounding Note  PCP-Cardiologist: Rozann Lesches, MD   Subjective:    [place] 11/22 w/ markedly elevated left and right filling pressure, mod pulmonary venous HTN and preserved CO.  She did not respond to maximal diuretics + milrinone 0.125, CVVH started.   This morning, she remains on milrinone 0.125 with co-ox 65%.  I/Os net negative -1947.  Currently pulling CVVH net negative 200 cc/hr, still making urine. MAP stable.     [place] 07/26/21  Right Heart Pressures RHC Procedural Findings: Hemodynamics (mmHg) RA mean 35 RV 57/28, 35 PA 58/29, mean 46 PCWP mean 42  Oxygen saturations: PA 70% AO 99%  Cardiac Output (Fick) 7.04  Cardiac Index (Fick) 3.01 PVR < 1 WU PAPI < 1    Objective:   Weight Range: (!) 143.5 kg Body mass index is 56.04 kg/m.   Vital Signs:   Temp:  [96.6 F (35.9 C)-97.6 F (36.4 C)] 97 F (36.1 C) (11/26 0722) Pulse Rate:  [82-94] 87 (11/26 0630) Resp:  [8-40] 11 (11/26 0630) BP: (83-119)/(46-107) 107/58 (11/26 0630) SpO2:  [90 %-100 %] 97 % (11/26 0630) Weight:  [143.5 kg] 143.5 kg (11/26 0500) Last BM Date: 07/29/21  Weight change: Filed Weights   07/28/21 0454 07/29/21 0611 07/30/21 0500  Weight: (!) 142 kg (!) 142.2 kg (!) 143.5 kg    Intake/Output:   Intake/Output Summary (Last 24 hours) at 07/30/2021 0756 Last data filed at 07/30/2021 0726 Gross per 24 hour  Intake 2152.3 ml  Output 3979 ml  Net -1826.7 ml      Physical Exam    General: NAD, obese.  Neck: JVP 16+, no thyromegaly or thyroid nodule.  Lungs: Decreased at bases. CV: Nonpalpable PMI.  Heart regular S1/S2, no S3/S4, no murmur.  2+ edema to thighs.  Abdomen: Soft, nontender, no hepatosplenomegaly, no distention.  Skin: Intact without lesions or rashes.  Neurologic: Alert and oriented x 3.  Psych: Normal affect. Extremities: No clubbing or cyanosis.  HEENT:  Normal.    Telemetry   NSR 80s Personally reviewed   Labs    CBC No results for input(s): WBC, NEUTROABS, HGB, HCT, MCV, PLT in the last 72 hours. Basic Metabolic Panel Recent Labs    07/29/21 0452 07/29/21 1600 07/30/21 0418  NA 134* 134* 133*  K 2.7* 3.0* 3.0*  CL 96* 95* 95*  CO2 24 26 27   GLUCOSE 97 102* 93  BUN 66* 65* 51*  CREATININE 3.69* 3.45* 2.90*  CALCIUM 9.2 9.2 9.1  MG 1.9  --  2.1  PHOS  --  5.1* 4.3   Liver Function Tests Recent Labs    07/29/21 1600 07/30/21 0418  ALBUMIN 3.6 3.7   No results for input(s): LIPASE, AMYLASE in the last 72 hours. Cardiac Enzymes No results for input(s): CKTOTAL, CKMB, CKMBINDEX, TROPONINI in the last 72 hours.  BNP: BNP (last 3 results) Recent Labs    03/04/21 1222 06/23/21 1807 07/22/21 1755  BNP 543.0* 357.0* 510.0*    ProBNP (last 3 results) No results for input(s): PROBNP in the last 8760 hours.   D-Dimer No results for input(s): DDIMER in the last 72 hours. Hemoglobin A1C No results for input(s): HGBA1C in the last 72 hours. Fasting Lipid Panel No results for input(s): CHOL, HDL, LDLCALC, TRIG, CHOLHDL, LDLDIRECT in the last 72 hours. Thyroid Function Tests No results for input(s): TSH, T4TOTAL,  T3FREE, THYROIDAB in the last 72 hours.  Invalid input(s): FREET3  Other results:   Imaging    DG CHEST PORT 1 VIEW  Result Date: 07/29/2021 CLINICAL DATA:  45 year old female status post central line placement. EXAM: PORTABLE CHEST - 1 VIEW COMPARISON:  07/25/2021 FINDINGS: The mediastinal contours are within normal limits. No cardiomegaly. Interval insertion right internal jugular central venous catheter with the catheter tip located in the superior aspect of the superior vena cava. The lungs are clear bilaterally without evidence of focal consolidation, pleural effusion, or pneumothorax. No acute osseous abnormality. IMPRESSION: Right IJ central line placement with the catheter tip in the superior  vena cava, no complicating features. Electronically Signed   By: Ruthann Cancer M.D.   On: 07/29/2021 16:18     Medications:     Scheduled Medications:  Chlorhexidine Gluconate Cloth  6 each Topical Daily   cholecalciferol  1,000 Units Oral Daily   heparin injection (subcutaneous)  5,000 Units Subcutaneous Q8H   midodrine  5 mg Oral TID WC   pantoprazole  40 mg Oral Daily   potassium chloride  30 mEq Oral TID   sodium chloride flush  3 mL Intravenous Q12H    Infusions:   prismasol BGK 4/2.5 400 mL/hr at 07/30/21 0543    prismasol BGK 4/2.5 200 mL/hr at 07/29/21 1647   heparin 10,000 units/ 20 mL infusion syringe 500 Units/hr (07/29/21 1647)   milrinone 0.125 mcg/kg/min (07/30/21 0715)   prismasol BGK 4/2.5 1,500 mL/hr at 07/30/21 0636    PRN Medications: camphor-menthol, heparin, heparin, HYDROmorphone (DILAUDID) injection    Assessment/Plan   1. Acute on chronic diastolic CHF: With prominent RV dysfunction.  Echo from 11/22 reviewed, EF 60-65%, normal RV systolic function with mild dilation, D-shaped septum consistent with RV pressure/volume overload. Patient is markedly volume overloaded with anasarca.  Unfortunately, creatinine is now up to 3.68. RHC  w/ markedly elevated left and right filling pressure, mod pulmonary venous HTN and preserved CO. PAPi low at 0.82. Minimal response to maximal diuretics and milrinone 0.125, CVVH started.  This morning, she remains on milrinone 0.125 with co-ox 65%.  CVVH, UF pulling net negative 200 cc/hr.  She remains very volume overloaded.  - Continue CVVH pulling 200 cc/hr net negative.  - Set up CVP.  - Continue milrinone 0.125 for now.  - Add midodrine 5 mg tid.  2. AKI on CKD stage IIIb: Baseline creatinine 1.9-2.3, now up to 3.68 in the setting of severe volume overload.  Suspect cardiorenal syndrome. RHC w/ normal CO but low PAPI suggesting RV dysfunction.  Now on CVVH.  - Nephrology following. Continue CVVH as above.  3. Cirrhosis:  Uncertain etiology, labs negative for viral hepatitis.  Not a heavy drinker.  May be due to NAFLD or RV failure.  4. OSA: Continue CPAP.  5. Hyponatremia: Resolved with CVVH.  6. Morbid obesity - Body mass index is 56.04 kg/m.  CRITICAL CARE Performed by: Loralie Champagne  Total critical care time: 35 minutes  Critical care time was exclusive of separately billable procedures and treating other patients.  Critical care was necessary to treat or prevent imminent or life-threatening deterioration.  Critical care was time spent personally by me on the following activities: development of treatment plan with patient and/or surrogate as well as nursing, discussions with consultants, evaluation of patient's response to treatment, examination of patient, obtaining history from patient or surrogate, ordering and performing treatments and interventions, ordering and review of laboratory studies, ordering and review  of radiographic studies, pulse oximetry and re-evaluation of patient's condition.    Length of Stay: 8  Loralie Champagne, MD  07/30/2021, 7:56 AM  Advanced Heart Failure Team Pager [phone] (M-F; 7a - 5p)  Please contact [facility] for night-coverage after hours (5p -7a ) and weekends on [email or web address]

## 2021-07-30 NOTE — Progress Notes (Signed)
Patient ID: Lauren Osborn, female   DOB: 15-Sep-1975, 45 y.o.   MRN: [id number] [facility] Progress Note   Assessment/ Plan:   1. Acute kidney Injury on chronic kidney disease stage IIIb (baseline creatinine 1.9-2.3): Appears hemodynamically mediated in the setting of acute exacerbation of diastolic heart failure with worsening from ongoing diuresis.  Transferred to ICU yesterday and started on CRRT for extracorporeal volume unloading after unimpressive response to diuretics and milrinone.  Overnight with net negative fluid balance which we will continue until dry weight/CVP goal attained; net negative about 200 cc/h. 2.  Hyponatremia: Secondary to CHF exacerbation/impaired free water deficit and inappropriate ADH activation-monitor on CRRT. 3.  Acute exacerbation of diastolic heart failure: Diuretics transiently held secondary to worsening renal insufficiency, continue fluid restriction at 1.5 L a day.  VQ scan negative for PE and right heart catheterization shows markedly elevated left and right heart filling pressures with moderate pulmonary venous hypertension and preserved cardiac output.  Started on CRRT after refractory to diuretics.Marland Kitchen 4.  Anemia: Hemoglobin and hematocrit slightly lower-unclear if this is dilutional from CHF exacerbation, no indication for ESA. 5.  Hypokalemia: Secondary to diuretic induced losses, will replace via oral route today.  Subjective:   Without acute events overnight, does not feel any significant noticeable improvement overnight.  Objective:   BP (!) 107/58   Pulse 87   Temp (!) 96.6 F (35.9 C) (Axillary)   Resp 11   Ht 5\' 3"  (1.6 m)   Wt (!) 143.5 kg   SpO2 97%   BMI 56.04 kg/m   Intake/Output Summary (Last 24 hours) at 07/30/2021 6295 Last data filed at 07/30/2021 0600 Gross per 24 hour  Intake 2032.3 ml  Output 3802 ml  Net -1769.7 ml   Weight change: 1.3 kg  Physical Exam: Gen: Appears comfortable resting in bed, awake and  alert and asking appropriate questions CVS: Pulse regular rhythm, normal rate, S1 and S2 normal Resp: Poor inspiratory effort with decreased breath sounds over bases, no distinct rales or rhonchi Abd: Soft, obese, nontender Ext: 2+ pitting edema over lower extremities  Imaging: DG CHEST PORT 1 VIEW  Result Date: 07/29/2021 CLINICAL DATA:  45 year old female status post central line placement. EXAM: PORTABLE CHEST - 1 VIEW COMPARISON:  07/25/2021 FINDINGS: The mediastinal contours are within normal limits. No cardiomegaly. Interval insertion right internal jugular central venous catheter with the catheter tip located in the superior aspect of the superior vena cava. The lungs are clear bilaterally without evidence of focal consolidation, pleural effusion, or pneumothorax. No acute osseous abnormality. IMPRESSION: Right IJ central line placement with the catheter tip in the superior vena cava, no complicating features. Electronically Signed   By: Ruthann Cancer M.D.   On: 07/29/2021 16:18    Labs: BMET Recent Labs  Lab 07/26/21 0224 07/26/21 1317 07/26/21 1413 07/27/21 0226 07/28/21 0155 07/29/21 0452 07/29/21 1600 07/30/21 0418  NA 133* 132* 135  135 133* 133* 134* 134* 133*  K 4.2 4.1 4.0  3.9 3.6 3.1* 2.7* 3.0* 3.0*  CL 96* 95*  --  96* 96* 96* 95* 95*  CO2 24 24  --  23 26 24 26 27   GLUCOSE 91 87  --  93 103* 97 102* 93  BUN 57* 58*  --  60* 63* 66* 65* 51*  CREATININE 3.58* 3.62*  --  3.68* 3.65* 3.69* 3.45* 2.90*  CALCIUM 9.4 9.3  --  9.3 9.1 9.2 9.2 9.1  PHOS  --   --   --   --   --   --  5.1* 4.3   CBC Recent Labs  Lab 07/26/21 1413  HGB 11.2*  11.2*  HCT 33.0*  33.0*    Medications:     Chlorhexidine Gluconate Cloth  6 each Topical Daily   cholecalciferol  1,000 Units Oral Daily   heparin injection (subcutaneous)  5,000 Units Subcutaneous Q8H   midodrine  5 mg Oral TID WC   pantoprazole  40 mg Oral Daily   potassium chloride  30 mEq Oral TID   sodium  chloride flush  3 mL Intravenous Q12H   Elmarie Shiley, MD 07/30/2021, 6:53 AM

## 2021-07-30 NOTE — Progress Notes (Signed)
   Palliative Medicine Inpatient Follow Up Note   ZOX:WRUEA Paone is a 45 year old with medical history significant for HTN, OSA on CPAP, CKD Stage 3b, HFpEF, GERD,  gout and obesity admitted 7 days ago with   two weeks worsening SOB, increased swelling of leg, abdominal distension and weight gain. She was admitted for management of acute exacerbation of HFpEF and treated with aggressive diuresis and inotrope support without the desired response. Other medical concerns include cirrhosis of uncertain etiology and anemia. She was moved to ICU  and CRRT treatment started yesterday, 11/26. She continues to be volume overloaded. She is followed by nephrology and cardiology.   Today's Discussion (* ): Discussed current status in ICU and lack of signed advanced directive. She still has not discussed advanced directive paperwork with daughter. She states yesterday her daughter was not in a good place with the move to ICU. I reiterated the importance of having her wishes for who can make decisions for her in writing. We reviewed again that only she has to sign the form.   Discussed the importance of continued conversation with family and their  medical providers regarding overall plan of care and treatment options, ensuring decisions are within the context of the patients values and GOCs.  Questions and concerns addressed.  Objective Assessment: Vital Signs Vitals:   07/30/21 0722 07/30/21 0800  BP:  91/75  Pulse:  95  Resp:  17  Temp: (!) 97 F (36.1 C)   SpO2:  100%    Intake/Output Summary (Last 24 hours) at 07/30/2021 0827 Last data filed at 07/30/2021 0800 Gross per 24 hour  Intake 2152.3 ml  Output 4166 ml  Net -2013.7 ml   Last Weight  Most recent update: 07/30/2021  5:41 AM    Weight  143.5 kg (316 lb 5.8 oz)               SUMMARY OF RECOMMENDATIONS   Code status/ACP:  FULL CODE- MOST form on chart, full scope of treatment (unchanged), no desire for feeding tube if  indicated.  Symptom management:  Pain, abdominal- hydromorphone prn GERD- pantoprazole Breathlessness- O2 prn, CPAP at night Nausea- not a  current concern. Avoid ondansetron and phenegran with potential for OT prolongation. Consider scopolamine if nausea is persistent as it's profile does not impact QT.   Palliative Prophylaxis:  Pruritis- Sarna lotion  Request for Chaplain to see Monday for advanced directive paperwork completion.   Discharge planning: Her goal is home. Her daughter and fiance live in the home. Patient's mother and two sisters live nearby.    Discussed the importance of continued conversation with family and their medical providers regarding overall plan of care and treatment options, ensuring decisions are within the context of the patient's values and GOC.  Time: 7:55-8:10 Time Spent: 15 minutes Greater than 50% of the time was spent in counseling and coordination of care  Lindell Spar, NP [place] Team Team Cell Phone: [phone] Please utilize secure chat with additional questions, if there is no response within 30 minutes please call the above phone number  Palliative Medicine Team providers are available by phone from 7am to 7pm daily and can be reached through the team cell phone.  Should this patient require assistance outside of these hours, please call the patient's attending physician.

## 2021-07-30 NOTE — Progress Notes (Signed)
PCCM:  Discussed with Dr. Aundra Dubin.  Patient will move to HF Service.  CCM will be available if needed   Garner Nash, DO [facility] 07/30/2021 10:39 AM

## 2021-07-31 DIAGNOSIS — R1011 Right upper quadrant pain: Secondary | ICD-10-CM | POA: Diagnosis not present

## 2021-07-31 DIAGNOSIS — I5033 Acute on chronic diastolic (congestive) heart failure: Secondary | ICD-10-CM | POA: Diagnosis not present

## 2021-07-31 DIAGNOSIS — R188 Other ascites: Secondary | ICD-10-CM | POA: Diagnosis not present

## 2021-07-31 DIAGNOSIS — R0600 Dyspnea, unspecified: Secondary | ICD-10-CM | POA: Diagnosis not present

## 2021-07-31 DIAGNOSIS — R601 Generalized edema: Secondary | ICD-10-CM | POA: Diagnosis not present

## 2021-07-31 DIAGNOSIS — N183 Chronic kidney disease, stage 3 unspecified: Secondary | ICD-10-CM | POA: Diagnosis not present

## 2021-07-31 DIAGNOSIS — K7460 Unspecified cirrhosis of liver: Secondary | ICD-10-CM | POA: Diagnosis not present

## 2021-07-31 DIAGNOSIS — N179 Acute kidney failure, unspecified: Secondary | ICD-10-CM | POA: Diagnosis not present

## 2021-07-31 LAB — COOXEMETRY PANEL
Carboxyhemoglobin: 2.1 % — ABNORMAL HIGH (ref 0.5–1.5)
Methemoglobin: 0.8 % (ref 0.0–1.5)
O2 Saturation: 71.4 %
Total hemoglobin: 10.4 g/dL — ABNORMAL LOW (ref 12.0–16.0)

## 2021-07-31 LAB — RENAL FUNCTION PANEL
Albumin: 3.5 g/dL (ref 3.5–5.0)
Albumin: 3.4 g/dL — ABNORMAL LOW (ref 3.5–5.0)
Anion gap: 11 (ref 5–15)
Anion gap: 9 (ref 5–15)
BUN: 34 mg/dL — ABNORMAL HIGH (ref 6–20)
BUN: 32 mg/dL — ABNORMAL HIGH (ref 6–20)
CO2: 25 mmol/L (ref 22–32)
CO2: 25 mmol/L (ref 22–32)
Calcium: 9 mg/dL (ref 8.9–10.3)
Calcium: 8.9 mg/dL (ref 8.9–10.3)
Chloride: 100 mmol/L (ref 98–111)
Chloride: 99 mmol/L (ref 98–111)
Creatinine, Ser: 2.25 mg/dL — ABNORMAL HIGH (ref 0.44–1.00)
Creatinine, Ser: 2.24 mg/dL — ABNORMAL HIGH (ref 0.44–1.00)
GFR, Estimated: 26 mL/min — ABNORMAL LOW (ref >60)
GFR, Estimated: 27 mL/min — ABNORMAL LOW (ref >60)
Glucose, Bld: 103 mg/dL — ABNORMAL HIGH (ref 70–99)
Glucose, Bld: 109 mg/dL — ABNORMAL HIGH (ref 70–99)
Phosphorus: 3.3 mg/dL (ref 2.5–4.6)
Phosphorus: 3.2 mg/dL (ref 2.5–4.6)
Potassium: 3.9 mmol/L (ref 3.5–5.1)
Potassium: 4.1 mmol/L (ref 3.5–5.1)
Sodium: 136 mmol/L (ref 135–145)
Sodium: 133 mmol/L — ABNORMAL LOW (ref 135–145)

## 2021-07-31 LAB — MAGNESIUM: Magnesium: 2.3 mg/dL (ref 1.7–2.4)

## 2021-07-31 MED ORDER — HYDROMORPHONE HCL 2 MG PO TABS
1.0000 mg | ORAL_TABLET | ORAL | Status: DC | PRN
  Administered 2021-07-31 – 2021-08-19 (×24): 1 mg via ORAL
  Filled 2021-07-31 (×24): qty 1

## 2021-07-31 NOTE — Progress Notes (Signed)
Orthopedic Tech Progress Note Patient Details:  Lauren Osborn 1975/12/08 [id number]  Ortho Devices Type of Ortho Device: Haematologist Ortho Device/Splint Location: BIL Ortho Device/Splint Interventions: Ordered, Application   Post Interventions Patient Tolerated: Well Instructions Provided: Adjustment of device Old Unna boots removed today and MD requested she get a new pair today rather than waiting till tomorrow. Patient is currently on a Sunday/Friday rotation for unna boots.  [name] 07/31/2021, 5:59 PM

## 2021-07-31 NOTE — Progress Notes (Signed)
   Palliative Medicine Inpatient Follow Up Note   HPI:  Lauren Osborn is a 45 year old with medical history significant for HTN, OSA on CPAP, CKD Stage 3b, HFpEF, GERD,  gout and obesity admitted 7 days ago with   two weeks worsening SOB, increased swelling of leg, abdominal distension and weight gain. She was admitted for management of acute exacerbation of HFpEF and treated with aggressive diuresis and inotrope support without the desired response. Other medical concerns include cirrhosis of uncertain etiology and anemia. She was moved to ICU  and CRRT treatment started 11/25 with improvement in volume overloaded. She is followed by nephrology and cardiology.    Today's Discussion symptom management: She states she feels al lot less puffy. Breathlessness is improving with fluid decrease. Pain is not being controlled for adequate sleep at night. We discussed transition to oral to see if this will allow a prolonged effect.   Discussed the importance of continued conversation with family and their  medical providers regarding overall plan of care and treatment options, ensuring decisions are within the context of the patients values and GOCs.   Questions and concerns addressed.   Objective Assessment: Vital Signs Vitals:   07/31/21 1704 07/31/21 1730  BP: 100/75 95/67  Pulse: (!) 103 (!) 104  Resp: 17 19  Temp:    SpO2: 92% 98%    Intake/Output Summary (Last 24 hours) at 07/31/2021 1741 Last data filed at 07/31/2021 1700 Gross per 24 hour  Intake 922.71 ml  Output 4665 ml  Net -3742.29 ml   Last Weight  Most recent update: 07/31/2021  6:35 AM    Weight  139.8 kg (308 lb 3.3 oz)              SUMMARY OF RECOMMENDATIONS    Code Status/Advance Care Planning: FULL CODE- MOST form is on chart. Full scope of treatment, no desire for feeding tube indicated.   Symptom Management:  Pain, abdominal - hydromorphone prn changed to 1 mg oral today from IV for longer effect. GERD-  pantoprazole Breathlessness- O2 prn, CPAP at night Nausea- Avoid ondansetron and phenegran with potential for OT prolongation. Consider scopolamine if nausea is persistent as it's profile does not impact QT.    Palliative Prophylaxis:  Pruritis- Sarna lotion prn   Request for Chaplain to see Monday for Advanced Directive paperwork to be notarized.   Discharge Planning: Home, daughter and her fiance live in the home.   Time In: 5:20 Time Out: 5:35 Time Spent: 15 minutes Greater than 50% of the time was spent in counseling and coordination of care ______________________________________________________________________________________ Lindell Spar, NP [place] Team Team Cell Phone: [phone] Please utilize secure chat with additional questions, if there is no response within 30 minutes please call the above phone number  Palliative Medicine Team providers are available by phone from 7am to 7pm daily and can be reached through the team cell phone.  Should this patient require assistance outside of these hours, please call the patient's attending physician.

## 2021-07-31 NOTE — Progress Notes (Signed)
Patient ID: Lauren Osborn, female   DOB: 04-06-1976, 45 y.o.   MRN: [id number]     Advanced Heart Failure Rounding Note  PCP-Cardiologist: Rozann Lesches, MD   Subjective:    [place] 11/22 w/ markedly elevated left and right filling pressure, mod pulmonary venous HTN and preserved CO.  She did not respond to maximal diuretics + milrinone 0.125, CVVH started.   This morning, she remains on milrinone 0.125 with co-ox 71%.  I/Os net negative -4275, weight down 8 lbs.  Currently pulling CVVH net negative 200 cc/hr, UOP 150 cc. MAP stable.  CVP still around 30.     [place] 07/26/21  Right Heart Pressures RHC Procedural Findings: Hemodynamics (mmHg) RA mean 35 RV 57/28, 35 PA 58/29, mean 46 PCWP mean 42  Oxygen saturations: PA 70% AO 99%  Cardiac Output (Fick) 7.04  Cardiac Index (Fick) 3.01 PVR < 1 WU PAPI < 1    Objective:   Weight Range: (!) 139.8 kg Body mass index is 54.6 kg/m.   Vital Signs:   Temp:  [97 F (36.1 C)-98.3 F (36.8 C)] 97.8 F (36.6 C) (11/27 0731) Pulse Rate:  [84-110] 101 (11/27 0731) Resp:  [11-43] 28 (11/27 0731) BP: (74-118)/(52-95) 100/64 (11/27 0731) SpO2:  [90 %-100 %] 100 % (11/27 0731) Weight:  [139.8 kg] 139.8 kg (11/27 0600) Last BM Date: 07/29/21  Weight change: Filed Weights   07/29/21 0611 07/30/21 0500 07/31/21 0600  Weight: (!) 142.2 kg (!) 143.5 kg (!) 139.8 kg    Intake/Output:   Intake/Output Summary (Last 24 hours) at 07/31/2021 0757 Last data filed at 07/31/2021 0720 Gross per 24 hour  Intake 923.67 ml  Output 5083 ml  Net -4159.33 ml      Physical Exam    General: NAD Neck: JVP 16+, no thyromegaly or thyroid nodule.  Lungs: Clear to auscultation bilaterally with normal respiratory effort. CV: Nondisplaced PMI.  Heart regular S1/S2, no S3/S4, no murmur.  2+ edema to thighs.  Abdomen: Soft, nontender, no hepatosplenomegaly, no distention.  Skin: Intact without lesions or rashes.  Neurologic: Alert and oriented x  3.  Psych: Normal affect. Extremities: No clubbing or cyanosis.  HEENT: Normal.    Telemetry   NSR 80s Personally reviewed   Labs    CBC No results for input(s): WBC, NEUTROABS, HGB, HCT, MCV, PLT in the last 72 hours. Basic Metabolic Panel Recent Labs    07/30/21 0418 07/30/21 1529 07/31/21 0500  NA 133* 133* 136  K 3.0* 3.3* 3.9  CL 95* 97* 100  CO2 27 26 25   GLUCOSE 93 106* 103*  BUN 51* 43* 34*  CREATININE 2.90* 2.44* 2.25*  CALCIUM 9.1 9.1 9.0  MG 2.1  --  2.3  PHOS 4.3 3.6 3.3   Liver Function Tests Recent Labs    07/30/21 1529 07/31/21 0500  ALBUMIN 3.6 3.5   No results for input(s): LIPASE, AMYLASE in the last 72 hours. Cardiac Enzymes No results for input(s): CKTOTAL, CKMB, CKMBINDEX, TROPONINI in the last 72 hours.  BNP: BNP (last 3 results) Recent Labs    03/04/21 1222 06/23/21 1807 07/22/21 1755  BNP 543.0* 357.0* 510.0*    ProBNP (last 3 results) No results for input(s): PROBNP in the last 8760 hours.   D-Dimer No results for input(s): DDIMER in the last 72 hours. Hemoglobin A1C No results for input(s): HGBA1C in the last 72 hours. Fasting Lipid Panel No results for input(s): CHOL, HDL, LDLCALC, TRIG, CHOLHDL, LDLDIRECT in the last 72  hours. Thyroid Function Tests No results for input(s): TSH, T4TOTAL, T3FREE, THYROIDAB in the last 72 hours.  Invalid input(s): FREET3  Other results:   Imaging    No results found.   Medications:     Scheduled Medications:  Chlorhexidine Gluconate Cloth  6 each Topical Daily   cholecalciferol  1,000 Units Oral Daily   heparin injection (subcutaneous)  5,000 Units Subcutaneous Q8H   midodrine  5 mg Oral TID WC   pantoprazole  40 mg Oral Daily   sodium chloride flush  3 mL Intravenous Q12H    Infusions:   prismasol BGK 4/2.5 400 mL/hr at 07/31/21 0727    prismasol BGK 4/2.5 200 mL/hr at 07/30/21 1838   heparin 10,000 units/ 20 mL infusion syringe 500 Units/hr (07/31/21 0710)    milrinone 0.125 mcg/kg/min (07/31/21 0600)   prismasol BGK 4/2.5 1,500 mL/hr at 07/31/21 0251    PRN Medications: camphor-menthol, heparin, heparin, HYDROmorphone (DILAUDID) injection    Assessment/Plan   1. Acute on chronic diastolic CHF: With prominent RV dysfunction.  Echo from 11/22 reviewed, EF 60-65%, normal RV systolic function with mild dilation, D-shaped septum consistent with RV pressure/volume overload. Patient is markedly volume overloaded with anasarca.  Unfortunately, creatinine is now up to 3.68. RHC  w/ markedly elevated left and right filling pressure, mod pulmonary venous HTN and preserved CO. PAPi low at 0.82. Minimal response to maximal diuretics and milrinone 0.125, CVVH started.  This morning, she remains on milrinone 0.125 with co-ox 71%.  CVVH, UF pulling net negative 200 cc/hr, weight coming down.  She remains very volume overloaded with CVP around 30.  - Continue CVVH pulling 200 cc/hr net negative.  - Continue milrinone 0.125 for now.  - continue midodrine 5 mg tid.  2. AKI on CKD stage IIIb: Baseline creatinine 1.9-2.3, now up to 3.68 in the setting of severe volume overload.  Suspect cardiorenal syndrome. RHC w/ normal CO but low PAPI suggesting RV dysfunction.  Now on CVVH.  Minimal UOP, 150 cc over last day.  - Nephrology following. Continue CVVH as above.  3. Cirrhosis: Uncertain etiology, labs negative for viral hepatitis.  Not a heavy drinker.  May be due to NAFLD or RV failure.  4. OSA: Continue CPAP at night.  5. Hyponatremia: Resolved with CVVH.  6. Morbid obesity - Body mass index is 54.6 kg/m.  CRITICAL CARE Performed by: Loralie Champagne  Total critical care time: 35 minutes  Critical care time was exclusive of separately billable procedures and treating other patients.  Critical care was necessary to treat or prevent imminent or life-threatening deterioration.  Critical care was time spent personally by me on the following activities: development  of treatment plan with patient and/or surrogate as well as nursing, discussions with consultants, evaluation of patient's response to treatment, examination of patient, obtaining history from patient or surrogate, ordering and performing treatments and interventions, ordering and review of laboratory studies, ordering and review of radiographic studies, pulse oximetry and re-evaluation of patient's condition.    Length of Stay: 9  Loralie Champagne, MD  07/31/2021, 7:57 AM  Advanced Heart Failure Team Pager [phone] (M-F; 7a - 5p)  Please contact [facility] for night-coverage after hours (5p -7a ) and weekends on [email or web address]

## 2021-07-31 NOTE — Progress Notes (Signed)
Patient ID: Lauren Osborn, female   DOB: 1975-12-01, 45 y.o.   MRN: [id number] [facility] Progress Note   Assessment/ Plan:   1. Acute kidney Injury on chronic kidney disease stage IIIb (baseline creatinine 1.9-2.3): Appears hemodynamically mediated in the setting of acute exacerbation of diastolic heart failure with worsening from ongoing diuresis.  Started on CRRT after refractory to diuretic therapy and currently tolerating net -200 cc/h; CVP 17 and will continue current rate of ultrafiltration. 2.  Hyponatremia: Secondary to CHF exacerbation/impaired free water deficit and inappropriate ADH activation-improving on CRRT. 3.  Acute exacerbation of diastolic heart failure: Diuretics transiently held secondary to worsening renal insufficiency, continue fluid restriction at 1.5 L a day.  VQ scan negative for PE and right heart catheterization shows markedly elevated left and right heart filling pressures with moderate pulmonary venous hypertension and preserved cardiac output.  On CRRT for volume unloading. 4.  Anemia: Hemoglobin and hematocrit slightly lower-unclear if this is dilutional from CHF exacerbation, no indication for ESA. 5.  Hypokalemia: Secondary to diuretic induced losses, replaced/corrected via oral route.  Subjective:   Without acute events overnight, continues to tolerate CRRT without problems.  Objective:   BP 105/67   Pulse 98   Temp 98.3 F (36.8 C) (Oral)   Resp 18   Ht 5\' 3"  (1.6 m)   Wt (!) 139.8 kg   SpO2 99%   BMI 54.60 kg/m   Intake/Output Summary (Last 24 hours) at 07/31/2021 2297 Last data filed at 07/31/2021 0700 Gross per 24 hour  Intake 807.67 ml  Output 5083 ml  Net -4275.33 ml   Weight change: -3.7 kg  Physical Exam: Gen: Comfortably resting in bed, nurses at bedside CVS: Pulse regular rhythm, normal rate, S1 and S2 normal Resp: Decreased breath sounds anteriorly, no rales/rhonchi Abd: Soft, obese, nontender Ext: 2+ pitting edema  over lower extremities  Imaging: DG CHEST PORT 1 VIEW  Result Date: 07/29/2021 CLINICAL DATA:  45 year old female status post central line placement. EXAM: PORTABLE CHEST - 1 VIEW COMPARISON:  07/25/2021 FINDINGS: The mediastinal contours are within normal limits. No cardiomegaly. Interval insertion right internal jugular central venous catheter with the catheter tip located in the superior aspect of the superior vena cava. The lungs are clear bilaterally without evidence of focal consolidation, pleural effusion, or pneumothorax. No acute osseous abnormality. IMPRESSION: Right IJ central line placement with the catheter tip in the superior vena cava, no complicating features. Electronically Signed   By: Ruthann Cancer M.D.   On: 07/29/2021 16:18    Labs: BMET Recent Labs  Lab 07/27/21 0226 07/28/21 0155 07/29/21 0452 07/29/21 1600 07/30/21 0418 07/30/21 1529 07/31/21 0500  NA 133* 133* 134* 134* 133* 133* 136  K 3.6 3.1* 2.7* 3.0* 3.0* 3.3* 3.9  CL 96* 96* 96* 95* 95* 97* 100  CO2 23 26 24 26 27 26 25   GLUCOSE 93 103* 97 102* 93 106* 103*  BUN 60* 63* 66* 65* 51* 43* 34*  CREATININE 3.68* 3.65* 3.69* 3.45* 2.90* 2.44* 2.25*  CALCIUM 9.3 9.1 9.2 9.2 9.1 9.1 9.0  PHOS  --   --   --  5.1* 4.3 3.6 3.3   CBC Recent Labs  Lab 07/26/21 1413  HGB 11.2*  11.2*  HCT 33.0*  33.0*    Medications:     Chlorhexidine Gluconate Cloth  6 each Topical Daily   cholecalciferol  1,000 Units Oral Daily   heparin injection (subcutaneous)  5,000 Units Subcutaneous Q8H  midodrine  5 mg Oral TID WC   pantoprazole  40 mg Oral Daily   sodium chloride flush  3 mL Intravenous Q12H   Elmarie Shiley, MD 07/31/2021, 7:06 AM

## 2021-07-31 NOTE — Plan of Care (Signed)
  Problem: Education: Goal: Ability to verbalize understanding of medication therapies will improve Outcome: Progressing   Problem: Cardiac: Goal: Ability to achieve and maintain adequate cardiopulmonary perfusion will improve Outcome: Progressing   

## 2021-08-01 DIAGNOSIS — Z515 Encounter for palliative care: Secondary | ICD-10-CM

## 2021-08-01 DIAGNOSIS — R1011 Right upper quadrant pain: Secondary | ICD-10-CM | POA: Diagnosis not present

## 2021-08-01 DIAGNOSIS — N183 Chronic kidney disease, stage 3 unspecified: Secondary | ICD-10-CM | POA: Diagnosis not present

## 2021-08-01 DIAGNOSIS — I5033 Acute on chronic diastolic (congestive) heart failure: Secondary | ICD-10-CM | POA: Diagnosis not present

## 2021-08-01 DIAGNOSIS — K7460 Unspecified cirrhosis of liver: Secondary | ICD-10-CM | POA: Diagnosis not present

## 2021-08-01 DIAGNOSIS — N179 Acute kidney failure, unspecified: Secondary | ICD-10-CM | POA: Diagnosis not present

## 2021-08-01 LAB — RENAL FUNCTION PANEL
Albumin: 3.4 g/dL — ABNORMAL LOW (ref 3.5–5.0)
Albumin: 3.3 g/dL — ABNORMAL LOW (ref 3.5–5.0)
Anion gap: 8 (ref 5–15)
Anion gap: 10 (ref 5–15)
BUN: 27 mg/dL — ABNORMAL HIGH (ref 6–20)
BUN: 26 mg/dL — ABNORMAL HIGH (ref 6–20)
CO2: 26 mmol/L (ref 22–32)
CO2: 27 mmol/L (ref 22–32)
Calcium: 8.8 mg/dL — ABNORMAL LOW (ref 8.9–10.3)
Calcium: 8.8 mg/dL — ABNORMAL LOW (ref 8.9–10.3)
Chloride: 98 mmol/L (ref 98–111)
Chloride: 98 mmol/L (ref 98–111)
Creatinine, Ser: 2.27 mg/dL — ABNORMAL HIGH (ref 0.44–1.00)
Creatinine, Ser: 2.18 mg/dL — ABNORMAL HIGH (ref 0.44–1.00)
GFR, Estimated: 26 mL/min — ABNORMAL LOW (ref >60)
GFR, Estimated: 27 mL/min — ABNORMAL LOW (ref >60)
Glucose, Bld: 102 mg/dL — ABNORMAL HIGH (ref 70–99)
Glucose, Bld: 107 mg/dL — ABNORMAL HIGH (ref 70–99)
Phosphorus: 3 mg/dL (ref 2.5–4.6)
Phosphorus: 2.8 mg/dL (ref 2.5–4.6)
Potassium: 4.2 mmol/L (ref 3.5–5.1)
Potassium: 4.4 mmol/L (ref 3.5–5.1)
Sodium: 132 mmol/L — ABNORMAL LOW (ref 135–145)
Sodium: 135 mmol/L (ref 135–145)

## 2021-08-01 LAB — MAGNESIUM: Magnesium: 2.4 mg/dL (ref 1.7–2.4)

## 2021-08-01 LAB — CBC
HCT: 31.5 % — ABNORMAL LOW (ref 36.0–46.0)
Hemoglobin: 10.2 g/dL — ABNORMAL LOW (ref 12.0–15.0)
MCH: 31.5 pg (ref 26.0–34.0)
MCHC: 32.4 g/dL (ref 30.0–36.0)
MCV: 97.2 fL (ref 80.0–100.0)
Platelets: 234 10*3/uL (ref 150–400)
RBC: 3.24 MIL/uL — ABNORMAL LOW (ref 3.87–5.11)
RDW: 16.1 % — ABNORMAL HIGH (ref 11.5–15.5)
WBC: 13.2 10*3/uL — ABNORMAL HIGH (ref 4.0–10.5)
nRBC: 0 % (ref 0.0–0.2)

## 2021-08-01 LAB — COOXEMETRY PANEL
Carboxyhemoglobin: 2.2 % — ABNORMAL HIGH (ref 0.5–1.5)
Methemoglobin: 0.6 % (ref 0.0–1.5)
O2 Saturation: 65.2 %
Total hemoglobin: 10.8 g/dL — ABNORMAL LOW (ref 12.0–16.0)

## 2021-08-01 NOTE — Progress Notes (Signed)
[facility] NEPHROLOGY PROGRESS NOTE  Assessment/ Plan:  # Acute kidney Injury on chronic kidney disease stage IIIb (baseline creatinine 1.9-2.3): Appears hemodynamically mediated in the setting of acute exacerbation of diastolic heart failure with worsening from ongoing diuresis.  Started on CRRT after refractory to diuretic therapy and currently tolerating net -200 cc/h; CVP high therefore we will  continue current rate of ultrafiltration.  All 4K bath and on fixed dose heparin.  She is anuric.  Strict ins and outs, daily lab.  # Hyponatremia: Secondary to CHF exacerbation/impaired free water deficit and inappropriate ADH activation-improving on CRRT.  #  Acute exacerbation of diastolic heart failure: Refractory to diuretics and inotropes.  VQ scan negative for PE and right heart catheterization shows markedly elevated left and right heart filling pressures with moderate pulmonary venous hypertension and preserved cardiac output.  Echo with EF of 60 to 65%.  On CRRT for volume management.  #  Anemia: Hemoglobin and hematocrit slightly lower-unclear if this is dilutional from CHF exacerbation, no indication for ESA.  Monitor hemoglobin.  # Hypokalemia: Secondary to diuretic induced losses, replaced/corrected via oral route.  #Liver cirrhosis: Hepatitis viral infection negative.  May be due to right heart failure.  #Hypotension: On midodrine and inotropic support with milrinone.  Subjective: Seen and examined in ICU.  In the process of changing CRRT filter.  No urine output.  The shortness of breath is about the same.  No other event.  Discussed with the ICU nurse. Objective Vital signs in last 24 hours: Vitals:   08/01/21 0600 08/01/21 0630 08/01/21 0700 08/01/21 0730  BP: (!) 95/57 99/87 94/63  (!) 85/51  Pulse: 99 98 (!) 121 (!) 106  Resp: 18 15 15 16   Temp:      TempSrc:      SpO2: 95% 94% 97% 95%  Weight: (!) 137.8 kg     Height:       Weight change: -2  kg  Intake/Output Summary (Last 24 hours) at 08/01/2021 0747 Last data filed at 08/01/2021 0600 Gross per 24 hour  Intake 874.62 ml  Output 4342 ml  Net -3467.38 ml       Labs: Basic Metabolic Panel: Recent Labs  Lab 07/31/21 0500 07/31/21 1612 08/01/21 0516  NA 136 133* 132*  K 3.9 4.1 4.2  CL 100 99 98  CO2 25 25 26   GLUCOSE 103* 109* 102*  BUN 34* 32* 27*  CREATININE 2.25* 2.24* 2.27*  CALCIUM 9.0 8.9 8.8*  PHOS 3.3 3.2 3.0   Liver Function Tests: Recent Labs  Lab 07/31/21 0500 07/31/21 1612 08/01/21 0516  ALBUMIN 3.5 3.4* 3.4*   No results for input(s): LIPASE, AMYLASE in the last 168 hours. No results for input(s): AMMONIA in the last 168 hours. CBC: Recent Labs  Lab 07/26/21 1413 08/01/21 0516  WBC  --  13.2*  HGB 11.2*  11.2* 10.2*  HCT 33.0*  33.0* 31.5*  MCV  --  97.2  PLT  --  234   Cardiac Enzymes: No results for input(s): CKTOTAL, CKMB, CKMBINDEX, TROPONINI in the last 168 hours. CBG: No results for input(s): GLUCAP in the last 168 hours.  Iron Studies: No results for input(s): IRON, TIBC, TRANSFERRIN, FERRITIN in the last 72 hours. Studies/Results: No results found.  Medications: Infusions:   prismasol BGK 4/2.5 400 mL/hr at 08/01/21 0256    prismasol BGK 4/2.5 200 mL/hr at 07/31/21 1425   heparin 10,000 units/ 20 mL infusion syringe 500 Units/hr (07/31/21 1425)  milrinone 0.125 mcg/kg/min (08/01/21 0600)   prismasol BGK 4/2.5 1,500 mL/hr at 08/01/21 0405    Scheduled Medications:  Chlorhexidine Gluconate Cloth  6 each Topical Daily   cholecalciferol  1,000 Units Oral Daily   heparin injection (subcutaneous)  5,000 Units Subcutaneous Q8H   midodrine  5 mg Oral TID WC   pantoprazole  40 mg Oral Daily   sodium chloride flush  3 mL Intravenous Q12H    have reviewed scheduled and prn medications.  Physical Exam: General:NAD, comfortable Heart:RRR, s1s2 nl Lungs: Distant breath sound and some crackles on the  basis Abdomen:soft, Non-tender, non-distended Extremities:LE pitting edema +, boot Dialysis Access: Temporary HD catheter  Satoya Feeley Prasad Baylea Milburn 08/01/2021,7:47 AM  LOS: 10 days

## 2021-08-01 NOTE — Progress Notes (Signed)
This chaplain responded to PMT referral for requesting a notary to complete the Pt. Advance Directive.    The Pt. is awake and willing to call the notary today. This chaplain placed the request with the spiritual care office and will update the RN-Sherry when a time is available.  Chaplain Sallyanne Kuster [phone]

## 2021-08-01 NOTE — Progress Notes (Addendum)
This chaplain is present with the Pt., notary, and two witnesses for the notarizing of the Pt. Advance Directive:  HCPOA.  The Pt. named Daryan Cagley as her healthcare agent. If the healthcare agent is unwilling or unable to serve the Pt. names Kelis Plasse as her next choice.  The chaplain gave the Pt. the original AD along with two copies.  The chaplain placed the AD in the Pt. bag in the wardrobe. A copy of the Pt. AD was scanned into the Pt. EMR.  The chaplain is available for F/U spiritual care as needed.  Chaplain Sallyanne Kuster  [phone]

## 2021-08-01 NOTE — Progress Notes (Addendum)
Daily Progress Note   Patient Name: Lauren Osborn       Date: 08/01/2021 DOB: 06/17/1976  Age: 45 y.o. MRN#: [id number] Attending Physician: Larey Dresser, MD Primary Care Physician: [name], MD Admit Date: 07/22/2021  Reason for Consultation/Follow-up: Establishing goals of care  Patient Profile/HPI: Lauren Osborn is a 45 year old with medical history significant for HTN, OSA on CPAP, CKD Stage 3b, HFpEF, GERD,  gout and obesity admitted 7 days ago with   two weeks worsening SOB, increased swelling of leg, abdominal distension and weight gain. She was admitted for management of acute exacerbation of HFpEF and treated with aggressive diuresis and inotrope support without the desired response. Other medical concerns include cirrhosis of uncertain etiology and anemia. She was moved to ICU  and CRRT treatment started 11/25 with improvement in volume overloaded. She is followed by nephrology and cardiology.  Subjective: Shaneil arouses easily. She completed AD/HCPOA with Chaplain today.  She continues to have abdominal pain, but it is well controlled with current medication regimen.   Vital Signs: BP 99/63 (BP Location: Right Arm)   Pulse (!) 106   Temp 98.5 F (36.9 C)   Resp (!) 21   Ht 5\' 3"  (1.6 m)   Wt (!) 137.8 kg   SpO2 98%   BMI 53.81 kg/m  SpO2: SpO2: 98 % O2 Device: O2 Device: Room Air O2 Flow Rate: O2 Flow Rate (L/min): 2 L/min  Intake/output summary:  Intake/Output Summary (Last 24 hours) at 08/01/2021 1459 Last data filed at 08/01/2021 1400 Gross per 24 hour  Intake 1102.58 ml  Output 4559 ml  Net -3456.42 ml   LBM: Last BM Date: 07/29/21 Baseline Weight: Weight: (!) 143.3 kg Most recent weight: Weight: (!) 137.8 kg       Palliative Assessment/Data: PPS:  40%      Patient Active Problem List   Diagnosis Date Noted  . Abdominal pain 07/23/2021  . Vitamin D deficiency 07/23/2021  . Hepatic cirrhosis ([place]) 07/23/2021  . Hepatic hemangioma 07/23/2021  . Right upper quadrant abdominal pain 07/22/2021  . Rectal bleeding 07/22/2021  . Shortness of breath 07/22/2021  . Acute exacerbation of CHF (congestive heart failure) ([place]) 07/22/2021  . CKD (chronic kidney disease), stage IV ([place]) 06/23/2021  . Liver lesion 12/01/2020  . Liver disease 12/01/2020  .  Elevated bilirubin 12/01/2020  . OSA (obstructive sleep apnea) 11/16/2020  . Acute on chronic heart failure with preserved ejection fraction (HFpEF) ([place]) 09/22/2020  . Morbid obesity with BMI of 50.0-59.9, adult ([place]) 09/22/2020  . Pulmonary edema 09/16/2020  . Hypokalemia 09/16/2020  . Prolonged QT interval 09/16/2020  . Acute kidney injury ([place]) 09/16/2020  . Elevated brain natriuretic peptide (BNP) level 09/16/2020  . GERD (gastroesophageal reflux disease) 09/16/2020  . Anasarca 09/16/2020  . Super obese 09/16/2020  . Normocytic anemia 04/23/2020  . Family history of colon cancer 04/23/2020  . Mass of upper outer quadrant of right breast 11/04/2019  . S/P partial thyroidectomy 10/21/2018  . Visit for routine gyn exam 07/03/2018    Palliative Care Assessment & Plan    Assessment/Recommendations/Plan  Continue full scope, full code PMT will continue to shadow and readdress GOC prn   Code Status: Full code  Prognosis:  Unable to determine  Discharge Planning: To Be Determined  Care plan was discussed with patient and care team.   Thank you for allowing the Palliative Medicine Team to assist in the care of this patient.  Total time:  20 min Prolonged billing: no     Greater than 50%  of this time was spent counseling and coordinating care related to the above assessment and plan.  Mariana Kaufman, AGNP-C Palliative Medicine   Please contact Palliative Medicine  Team phone at [phone] for questions and concerns.

## 2021-08-01 NOTE — Plan of Care (Signed)
  Problem: Education: Goal: Ability to demonstrate management of disease process will improve Outcome: Progressing Goal: Ability to verbalize understanding of medication therapies will improve Outcome: Progressing Goal: Individualized Educational Video(s) Outcome: Progressing   Problem: Education: Goal: Knowledge of General Education information will improve Description: Including pain rating scale, medication(s)/side effects and non-pharmacologic comfort measures Outcome: Progressing   Problem: Health Behavior/Discharge Planning: Goal: Ability to manage health-related needs will improve Outcome: Progressing   Problem: Clinical Measurements: Goal: Ability to maintain clinical measurements within normal limits will improve Outcome: Progressing Goal: Will remain free from infection Outcome: Progressing Goal: Diagnostic test results will improve Outcome: Progressing Goal: Respiratory complications will improve Outcome: Progressing Goal: Cardiovascular complication will be avoided Outcome: Progressing   Problem: Activity: Goal: Risk for activity intolerance will decrease Outcome: Progressing   Problem: Nutrition: Goal: Adequate nutrition will be maintained Outcome: Progressing   Problem: Coping: Goal: Level of anxiety will decrease Outcome: Progressing   Problem: Elimination: Goal: Will not experience complications related to bowel motility Outcome: Progressing Goal: Will not experience complications related to urinary retention Outcome: Progressing   Problem: Pain Managment: Goal: General experience of comfort will improve Outcome: Progressing   Problem: Safety: Goal: Ability to remain free from injury will improve Outcome: Progressing   Problem: Skin Integrity: Goal: Risk for impaired skin integrity will decrease Outcome: Progressing   Problem: Cardiac: Goal: Ability to achieve and maintain adequate cardiopulmonary perfusion will improve Outcome:  Progressing

## 2021-08-01 NOTE — Progress Notes (Addendum)
Patient ID: Lauren Osborn, female   DOB: August 12, 1976, 45 y.o.   MRN: [id number]     Advanced Heart Failure Rounding Note  PCP-Cardiologist: Rozann Lesches, MD   Subjective:    [place] 11/22 w/ markedly elevated left and right filling pressure, mod pulmonary venous HTN and preserved CO.  She did not respond to maximal diuretics + milrinone 0.125, CVVH started.   Remains on milrinone 0.125. Co-ox 65%.  Additional 4.3L fluid removal off CVVH yesterday. Currently pulling 200/hr. Today's wt not charted yet. CVP still high, 28-30  No complaints today. Denies dyspnea.     [place] 07/26/21  Right Heart Pressures RHC Procedural Findings: Hemodynamics (mmHg) RA mean 35 RV 57/28, 35 PA 58/29, mean 46 PCWP mean 42  Oxygen saturations: PA 70% AO 99%  Cardiac Output (Fick) 7.04  Cardiac Index (Fick) 3.01 PVR < 1 WU PAPI < 1    Objective:   Weight Range: (!) 139.8 kg Body mass index is 54.6 kg/m.   Vital Signs:   Temp:  [97.6 F (36.4 C)-99.2 F (37.3 C)] 97.6 F (36.4 C) (11/28 0400) Pulse Rate:  [93-106] 99 (11/28 0600) Resp:  [16-39] 18 (11/28 0600) BP: (86-111)/(45-81) 95/57 (11/28 0600) SpO2:  [90 %-100 %] 95 % (11/28 0600) Last BM Date: 07/29/21  Weight change: Filed Weights   07/29/21 0611 07/30/21 0500 07/31/21 0600  Weight: (!) 142.2 kg (!) 143.5 kg (!) 139.8 kg    Intake/Output:   Intake/Output Summary (Last 24 hours) at 08/01/2021 0354 Last data filed at 08/01/2021 0600 Gross per 24 hour  Intake 1110.62 ml  Output 4342 ml  Net -3231.38 ml      Physical Exam   CVP 28-30  General:  super morbidly obese female. No respiratory difficulty HEENT: normal Neck: supple. JVD to jaw + Rt IJ HD cath Carotids 2+ bilat; no bruits. No lymphadenopathy or thyromegaly appreciated. Cor: PMI nondisplaced. Regular rhythm, tachy rate. No rubs, gallops or murmurs. Lungs: decreased BS at the bases bilaterally  Abdomen: obese, soft, nontender, nondistended. No  hepatosplenomegaly. No bruits or masses. Good bowel sounds. Extremities: no cyanosis, clubbing, rash, 2+ bilateral LE edema + bilateral unna boots  Neuro: alert & oriented x 3, cranial nerves grossly intact. moves all 4 extremities w/o difficulty. Affect pleasant.   Telemetry   Sinus Tach, 102 bpm Personally reviewed   Labs    CBC Recent Labs    08/01/21 0516  WBC 13.2*  HGB 10.2*  HCT 31.5*  MCV 97.2  PLT 656   Basic Metabolic Panel Recent Labs    07/31/21 0500 07/31/21 1612 08/01/21 0516  NA 136 133* 132*  K 3.9 4.1 4.2  CL 100 99 98  CO2 25 25 26   GLUCOSE 103* 109* 102*  BUN 34* 32* 27*  CREATININE 2.25* 2.24* 2.27*  CALCIUM 9.0 8.9 8.8*  MG 2.3  --  2.4  PHOS 3.3 3.2 3.0   Liver Function Tests Recent Labs    07/31/21 1612 08/01/21 0516  ALBUMIN 3.4* 3.4*   No results for input(s): LIPASE, AMYLASE in the last 72 hours. Cardiac Enzymes No results for input(s): CKTOTAL, CKMB, CKMBINDEX, TROPONINI in the last 72 hours.  BNP: BNP (last 3 results) Recent Labs    03/04/21 1222 06/23/21 1807 07/22/21 1755  BNP 543.0* 357.0* 510.0*    ProBNP (last 3 results) No results for input(s): PROBNP in the last 8760 hours.   D-Dimer No results for input(s): DDIMER in the last 72 hours. Hemoglobin A1C No  results for input(s): HGBA1C in the last 72 hours. Fasting Lipid Panel No results for input(s): CHOL, HDL, LDLCALC, TRIG, CHOLHDL, LDLDIRECT in the last 72 hours. Thyroid Function Tests No results for input(s): TSH, T4TOTAL, T3FREE, THYROIDAB in the last 72 hours.  Invalid input(s): FREET3  Other results:   Imaging    No results found.   Medications:     Scheduled Medications:  Chlorhexidine Gluconate Cloth  6 each Topical Daily   cholecalciferol  1,000 Units Oral Daily   heparin injection (subcutaneous)  5,000 Units Subcutaneous Q8H   midodrine  5 mg Oral TID WC   pantoprazole  40 mg Oral Daily   sodium chloride flush  3 mL Intravenous  Q12H    Infusions:   prismasol BGK 4/2.5 400 mL/hr at 08/01/21 0256    prismasol BGK 4/2.5 200 mL/hr at 07/31/21 1425   heparin 10,000 units/ 20 mL infusion syringe 500 Units/hr (07/31/21 1425)   milrinone 0.125 mcg/kg/min (08/01/21 0600)   prismasol BGK 4/2.5 1,500 mL/hr at 08/01/21 0405    PRN Medications: camphor-menthol, heparin, heparin, HYDROmorphone    Assessment/Plan   1. Acute on chronic diastolic CHF: With prominent RV dysfunction.  Echo from 11/22 reviewed, EF 60-65%, normal RV systolic function with mild dilation, D-shaped septum consistent with RV pressure/volume overload. Patient is markedly volume overloaded with anasarca.  Unfortunately, creatinine is now up to 3.68. RHC  w/ markedly elevated left and right filling pressure, mod pulmonary venous HTN and preserved CO. PAPi low at 0.82. Minimal response to maximal diuretics and milrinone 0.125, CVVH started.  This morning, she remains on milrinone 0.125 with co-ox 65%.  CVVH, UF pulling net negative 200 cc/hr, weight coming down.  She remains very volume overloaded with CVP 28-30.  - Continue CVVH pulling 200 cc/hr net negative.  - Continue milrinone 0.125 for now.  - continue midodrine 5 mg tid.  2. AKI on CKD stage IIIb: Baseline creatinine 1.9-2.3, bumped to 3.68 in the setting of severe volume overload.  Suspect cardiorenal syndrome. RHC w/ normal CO but low PAPI suggesting RV dysfunction.  Now on CVVH.  Anuric  - Nephrology following. Continue CVVH as above.  3. Cirrhosis: Uncertain etiology, labs negative for viral hepatitis.  Not a heavy drinker.  May be due to NAFLD or RV failure.  4. OSA: Continue CPAP at night.  5. Hyponatremia: Resolved with CVVH.  6. Morbid obesity - Body mass index is 54.6 kg/m.    Length of Stay: [address], PA-C  08/01/2021, 7:12 AM  Advanced Heart Failure Team Pager [phone] (M-F; 7a - 5p)  Please contact [facility] for night-coverage after hours (5p -7a ) and  weekends on [email or web address]  Patient seen with PA, agree with the above note.   Co-ox 65% on milrinone 0.125.  CVP remains near 30.   Pulling UF 200 cc/hr net, weight down 5 lbs. Now anuric.   General: NAD Neck: JVP 16+, no thyromegaly or thyroid nodule.  Lungs: Clear to auscultation bilaterally with normal respiratory effort. CV: Nondisplaced PMI.  Heart regular S1/S2, no S3/S4, no murmur.  2+ edema to thighs Abdomen: Soft, nontender, no hepatosplenomegaly, no distention.  Skin: Intact without lesions or rashes.  Neurologic: Alert and oriented x 3.  Psych: Normal affect. Extremities: No clubbing or cyanosis.  HEENT: Normal.   Still remains markedly volume overloaded.  Continue current rate CVVH, weight is coming down steadily. Still have a way to go.  Continue milrinone for now for RV support.  Concerned that renal function may not recover as she is now anuric.   CRITICAL CARE Performed by: Loralie Champagne  Total critical care time: 35 minutes  Critical care time was exclusive of separately billable procedures and treating other patients.  Critical care was necessary to treat or prevent imminent or life-threatening deterioration.  Critical care was time spent personally by me on the following activities: development of treatment plan with patient and/or surrogate as well as nursing, discussions with consultants, evaluation of patient's response to treatment, examination of patient, obtaining history from patient or surrogate, ordering and performing treatments and interventions, ordering and review of laboratory studies, ordering and review of radiographic studies, pulse oximetry and re-evaluation of patient's condition.  Loralie Champagne 08/01/2021 8:40 AM

## 2021-08-01 NOTE — Progress Notes (Signed)
Pt declined CPAP tonight. °

## 2021-08-02 DIAGNOSIS — I5033 Acute on chronic diastolic (congestive) heart failure: Secondary | ICD-10-CM | POA: Diagnosis not present

## 2021-08-02 LAB — RENAL FUNCTION PANEL
Albumin: 3.2 g/dL — ABNORMAL LOW (ref 3.5–5.0)
Albumin: 3.4 g/dL — ABNORMAL LOW (ref 3.5–5.0)
Anion gap: 9 (ref 5–15)
Anion gap: 8 (ref 5–15)
BUN: 26 mg/dL — ABNORMAL HIGH (ref 6–20)
BUN: 24 mg/dL — ABNORMAL HIGH (ref 6–20)
CO2: 25 mmol/L (ref 22–32)
CO2: 27 mmol/L (ref 22–32)
Calcium: 8.8 mg/dL — ABNORMAL LOW (ref 8.9–10.3)
Calcium: 8.8 mg/dL — ABNORMAL LOW (ref 8.9–10.3)
Chloride: 99 mmol/L (ref 98–111)
Chloride: 98 mmol/L (ref 98–111)
Creatinine, Ser: 2.11 mg/dL — ABNORMAL HIGH (ref 0.44–1.00)
Creatinine, Ser: 2.14 mg/dL — ABNORMAL HIGH (ref 0.44–1.00)
GFR, Estimated: 29 mL/min — ABNORMAL LOW (ref >60)
GFR, Estimated: 28 mL/min — ABNORMAL LOW (ref >60)
Glucose, Bld: 99 mg/dL (ref 70–99)
Glucose, Bld: 108 mg/dL — ABNORMAL HIGH (ref 70–99)
Phosphorus: 3 mg/dL (ref 2.5–4.6)
Phosphorus: 3.2 mg/dL (ref 2.5–4.6)
Potassium: 4.5 mmol/L (ref 3.5–5.1)
Potassium: 4.6 mmol/L (ref 3.5–5.1)
Sodium: 133 mmol/L — ABNORMAL LOW (ref 135–145)
Sodium: 133 mmol/L — ABNORMAL LOW (ref 135–145)

## 2021-08-02 LAB — CBC
HCT: 30.2 % — ABNORMAL LOW (ref 36.0–46.0)
Hemoglobin: 10.1 g/dL — ABNORMAL LOW (ref 12.0–15.0)
MCH: 32.8 pg (ref 26.0–34.0)
MCHC: 33.4 g/dL (ref 30.0–36.0)
MCV: 98.1 fL (ref 80.0–100.0)
Platelets: 226 10*3/uL (ref 150–400)
RBC: 3.08 MIL/uL — ABNORMAL LOW (ref 3.87–5.11)
RDW: 16.4 % — ABNORMAL HIGH (ref 11.5–15.5)
WBC: 15.4 10*3/uL — ABNORMAL HIGH (ref 4.0–10.5)
nRBC: 0 % (ref 0.0–0.2)

## 2021-08-02 LAB — COOXEMETRY PANEL
Carboxyhemoglobin: 1.9 % — ABNORMAL HIGH (ref 0.5–1.5)
Methemoglobin: 0.7 % (ref 0.0–1.5)
O2 Saturation: 72.7 %
Total hemoglobin: 14.1 g/dL (ref 12.0–16.0)

## 2021-08-02 LAB — MAGNESIUM: Magnesium: 2.4 mg/dL (ref 1.7–2.4)

## 2021-08-02 LAB — GLUCOSE, CAPILLARY: Glucose-Capillary: 92 mg/dL (ref 70–99)

## 2021-08-02 LAB — URIC ACID: Uric Acid, Serum: 4.3 mg/dL (ref 2.5–7.1)

## 2021-08-02 MED ORDER — PROSOURCE PLUS PO LIQD
ORAL | Status: AC
  Administered 2021-08-02: 30 mL via ORAL
  Filled 2021-08-02: qty 30

## 2021-08-02 MED ORDER — RENA-VITE PO TABS
1.0000 | ORAL_TABLET | Freq: Every day | ORAL | Status: DC
  Administered 2021-08-02 – 2021-08-19 (×18): 1 via ORAL
  Filled 2021-08-02 (×17): qty 1

## 2021-08-02 MED ORDER — PREDNISONE 20 MG PO TABS
20.0000 mg | ORAL_TABLET | Freq: Every day | ORAL | Status: AC
  Administered 2021-08-05 – 2021-08-07 (×3): 20 mg via ORAL
  Filled 2021-08-02 (×3): qty 1

## 2021-08-02 MED ORDER — PREDNISONE 20 MG PO TABS
40.0000 mg | ORAL_TABLET | Freq: Every day | ORAL | Status: AC
  Administered 2021-08-02 – 2021-08-04 (×3): 40 mg via ORAL
  Filled 2021-08-02 (×3): qty 2

## 2021-08-02 MED ORDER — PREDNISONE 20 MG PO TABS
ORAL_TABLET | ORAL | Status: AC
  Filled 2021-08-02: qty 2

## 2021-08-02 MED ORDER — ASCORBIC ACID 500 MG PO TABS
500.0000 mg | ORAL_TABLET | Freq: Every day | ORAL | Status: DC
  Administered 2021-08-02 – 2021-08-11 (×10): 500 mg via ORAL
  Filled 2021-08-02 (×9): qty 1

## 2021-08-02 MED ORDER — RENA-VITE PO TABS
ORAL_TABLET | ORAL | Status: AC
  Filled 2021-08-02: qty 1

## 2021-08-02 MED ORDER — MIDODRINE HCL 5 MG PO TABS
10.0000 mg | ORAL_TABLET | Freq: Three times a day (TID) | ORAL | Status: DC
  Administered 2021-08-02 – 2021-08-07 (×17): 10 mg via ORAL
  Filled 2021-08-02 (×15): qty 2

## 2021-08-02 MED ORDER — PROSOURCE PLUS PO LIQD
30.0000 mL | Freq: Three times a day (TID) | ORAL | Status: DC
  Administered 2021-08-02 – 2021-08-03 (×4): 30 mL via ORAL
  Filled 2021-08-02 (×3): qty 30

## 2021-08-02 MED ORDER — ASCORBIC ACID 500 MG PO TABS
ORAL_TABLET | ORAL | Status: AC
  Filled 2021-08-02: qty 1

## 2021-08-02 MED ORDER — PROSOURCE PLUS PO LIQD
ORAL | Status: AC
  Filled 2021-08-02: qty 30

## 2021-08-02 NOTE — Progress Notes (Signed)
PT Cancellation Note  Patient Details Name: Lauren Osborn MRN: [id number] DOB: 1976-06-08   Cancelled Treatment:    Reason Eval/Treat Not Completed: Patient declined, no reason specified (pt fatigued after OOB with nursing and request deferral at this time)   [place] 08/02/2021, 10:19 AM Bayard Males, PT Acute Rehabilitation Services Pager: [phone] Office: [phone]

## 2021-08-02 NOTE — Progress Notes (Signed)
Initial Nutrition Assessment  DOCUMENTATION CODES:   Morbid obesity  INTERVENTION:   30 ml ProSource Plus TID, each supplement provides 100 kcals and 15 grams protein.   Add Renal MVI plus Vitamin C 500 mg daily while on CRRT   Recommend scheduled bowel regimen; RN plans to discuss with MD  NUTRITION DIAGNOSIS:   Increased nutrient needs related to acute illness as evidenced by estimated needs.  GOAL:   Patient will meet greater than or equal to 90% of their needs  MONITOR:   PO intake, Supplement acceptance, Labs, Weight trends  REASON FOR ASSESSMENT:   LOS, Rounds    ASSESSMENT:   45 yo female with acute on chronic CHF, AKI on CKD 3 requiring CRRT for significant volume removal, +cirrhosis/ PMH includes HTN, gout, GERD, CKD, liver hemangioma/liver disease, cholelithiasis, gastritis, esophagitis  Pt sitting in chair, appears groggy but participates in assessment  CRRT with UF 200 ml/hr, remains anuric. No signs of renal recovery currently, noted pt will liekly need long term dialysis  Appetite poor; pt ate minimal amount of breakfast this AM, RN estimates around 10%. Pt reports she is not a big breakfast eater but regardless she has no appetite and no taste for anything. Denies nausea or altered taste. Pt does report early satiety, abdominal fullness with recent constipation. Pt reports she had a BM this AM and feels a little better. Recorded po intake through the 27th was 50-100%; no recorded po intake from yesterday or today  Pt reports UBW around 260-280 pounds; pt reports over the last several months that she has lost weight. Unable to assess current dry weight a this time. Admit weight 143.3 kg; current wt 132.6 kg. Net negative 17 L per I/O flow sheet. +moderate edema on exam  Labs: reviewed Meds: cholecalciferol, prednisone   Diet Order:   Diet Order             Diet 2 gram sodium Room service appropriate? Yes; Fluid consistency: Thin; Fluid restriction:  1200 mL Fluid  Diet effective now                   EDUCATION NEEDS:   Not appropriate for education at this time  Skin:  Skin Assessment: Reviewed RN Assessment  Last BM:  11/29  Height:   Ht Readings from Last 1 Encounters:  07/23/21 5\' 3"  (1.6 m)    Weight:   Wt Readings from Last 1 Encounters:  08/02/21 (S) 132.6 kg     BMI:  Body mass index is 51.78 kg/m.  Estimated Nutritional Needs:   Kcal:  2000-2200 kcals  Protein:  100-120 g  Fluid:  1200 mL fluid restriction per MD  Kerman Passey MS, RDN, LDN, CNSC Registered Dietitian III Clinical Nutrition RD Pager and On-Call Pager Number Located in [place]

## 2021-08-02 NOTE — Progress Notes (Signed)
Lauren Osborn PROGRESS NOTE  Assessment/ Plan:  # Acute kidney Injury on chronic kidney disease stage IIIb (baseline creatinine 1.9-2.3): Appears hemodynamically mediated in the setting of acute exacerbation of diastolic heart failure with worsening from ongoing diuresis.  Started on CRRT after refractory to diuretic therapy and currently tolerating net -200 cc/h; CVP high therefore we will  continue current rate of ultrafiltration.  All 4K bath and on fixed dose heparin.  She is anuric.  Strict ins and outs, daily lab. No sign of renal recovery therefore she will likely need dialysis.  Increasing midodrine to 10 mg 3 times daily for hypotension.  Once volume is optimized with CRRT we will attempt IHD.  Discussed with the patient and cardiology team.  # Hyponatremia: Secondary to CHF exacerbation/impaired free water deficit and inappropriate ADH activation-improving on CRRT.  #  Acute exacerbation of diastolic heart failure: Refractory to diuretics and inotropes.  VQ scan negative for PE and right heart catheterization shows markedly elevated left and right heart filling pressures with moderate pulmonary venous hypertension and preserved cardiac output.  Echo with EF of 60 to 65%.  On CRRT for volume management.  Stopped midodrine by cardiology.  #  Anemia: Hemoglobin and hematocrit slightly lower-unclear if this is dilutional from CHF exacerbation, no indication for ESA.  Monitor hemoglobin.  # Hypokalemia: Secondary to diuretic induced losses, replaced/corrected via oral route.  #Liver cirrhosis: Hepatitis viral infection negative.  May be due to right heart failure.  #Hypotension: Milrinone stopped by cardiology and increase midodrine dose.  Monitor BP.  Subjective: Seen and examined in ICU.  No urine output.  Denies nausea, vomiting, chest pain, shortness of breath.  Blood pressure soft but tolerating ultrafiltration with CRRT. Objective Vital signs in last 24  hours: Vitals:   08/02/21 0530 08/02/21 0600 08/02/21 0630 08/02/21 0700  BP: (!) 92/49 (!) 96/55 (!) 95/59 96/68  Pulse: (!) 105 (!) 105 (!) 104 (!) 112  Resp: 14 (!) 27 17 17   Temp:      TempSrc:      SpO2: 96% 92% 95% 99%  Weight: 131.7 kg   (S) 132.6 kg  Height:       Weight change: -6.1 kg  Intake/Output Summary (Last 24 hours) at 08/02/2021 0749 Last data filed at 08/02/2021 0700 Gross per 24 hour  Intake 1162.71 ml  Output 5166 ml  Net -4003.29 ml        Labs: Basic Metabolic Panel: Recent Labs  Lab 08/01/21 0516 08/01/21 1507 08/02/21 0211  NA 132* 135 133*  K 4.2 4.4 4.5  CL 98 98 99  CO2 26 27 25   GLUCOSE 102* 107* 99  BUN 27* 26* 26*  CREATININE 2.27* 2.18* 2.11*  CALCIUM 8.8* 8.8* 8.8*  PHOS 3.0 2.8 3.0    Liver Function Tests: Recent Labs  Lab 08/01/21 0516 08/01/21 1507 08/02/21 0211  ALBUMIN 3.4* 3.3* 3.2*    No results for input(s): LIPASE, AMYLASE in the last 168 hours. No results for input(s): AMMONIA in the last 168 hours. CBC: Recent Labs  Lab 07/26/21 1413 08/01/21 0516 08/02/21 0211  WBC  --  13.2* 15.4*  HGB 11.2*  11.2* 10.2* 10.1*  HCT 33.0*  33.0* 31.5* 30.2*  MCV  --  97.2 98.1  PLT  --  234 226    Cardiac Enzymes: No results for input(s): CKTOTAL, CKMB, CKMBINDEX, TROPONINI in the last 168 hours. CBG: No results for input(s): GLUCAP in the last 168 hours.  Iron  Studies: No results for input(s): IRON, TIBC, TRANSFERRIN, FERRITIN in the last 72 hours. Studies/Results: No results found.  Medications: Infusions:   prismasol BGK 4/2.5 400 mL/hr at 08/01/21 1643    prismasol BGK 4/2.5 200 mL/hr at 08/01/21 1659   heparin 10,000 units/ 20 mL infusion syringe 500 Units/hr (08/02/21 0207)   prismasol BGK 4/2.5 1,500 mL/hr at 08/02/21 0029    Scheduled Medications:  Chlorhexidine Gluconate Cloth  6 each Topical Daily   cholecalciferol  1,000 Units Oral Daily   heparin injection (subcutaneous)  5,000 Units  Subcutaneous Q8H   midodrine  10 mg Oral TID WC   pantoprazole  40 mg Oral Daily   sodium chloride flush  3 mL Intravenous Q12H    have reviewed scheduled and prn medications.  Physical Exam: General: Not in distress, looks comfortable Heart:RRR, s1s2 nl Lungs: Clear bilateral anteriorly. Abdomen:soft, Non-tender, non-distended Extremities:LE pitting edema +, boot Dialysis Access: Temporary HD catheter  Desteni Piscopo Prasad Bettyjean Stefanski 08/02/2021,7:49 AM  LOS: 11 days

## 2021-08-02 NOTE — Progress Notes (Signed)
Patient ID: Lauren Osborn, female   DOB: [birth year], 45 y.o.   MRN: [id number]     Advanced Heart Failure Rounding Note  PCP-Cardiologist: Rozann Lesches, MD   Subjective:    [place] 11/22 w/ markedly elevated left and right filling pressure, mod pulmonary venous HTN and preserved CO.  She did not respond to maximal diuretics + milrinone 0.125, CVVH started.   Remains on milrinone 0.125. Co-ox 73%.  Weight down 11 lbs Currently pulling 200/hr. CVP still high, 26-27.  Reports right ankle pain, has had gout in past.     RHC 07/26/21  Right Heart Pressures RHC Procedural Findings: Hemodynamics (mmHg) RA mean 35 RV 57/28, 35 PA 58/29, mean 46 PCWP mean 42  Oxygen saturations: PA 70% AO 99%  Cardiac Output (Fick) 7.04  Cardiac Index (Fick) 3.01 PVR < 1 WU PAPI < 1    Objective:   Weight Range: (S) 132.6 kg Body mass index is 51.78 kg/m.   Vital Signs:   Temp:  [97.6 F (36.4 C)-98.5 F (36.9 C)] 98.3 F (36.8 C) (11/29 0330) Pulse Rate:  [98-112] 112 (11/29 0700) Resp:  [13-30] 17 (11/29 0700) BP: (85-113)/(45-69) 96/68 (11/29 0700) SpO2:  [92 %-100 %] 99 % (11/29 0700) Weight:  [131.7 kg-132.6 kg] 132.6 kg (11/29 0700) Last BM Date: 08/01/21  Weight change: Filed Weights   08/01/21 0600 08/02/21 0530 08/02/21 0700  Weight: (!) 137.8 kg 131.7 kg (S) 132.6 kg    Intake/Output:   Intake/Output Summary (Last 24 hours) at 08/02/2021 0736 Last data filed at 08/02/2021 0700 Gross per 24 hour  Intake 1162.71 ml  Output 5166 ml  Net -4003.29 ml      Physical Exam   CVP 26-27 General: NAD Neck: JVP 16+, no thyromegaly or thyroid nodule.  Lungs: Clear to auscultation bilaterally with normal respiratory effort. CV: Nondisplaced PMI.  Heart regular S1/S2, no S3/S4, no murmur.  2+ edema to knees.  Abdomen: Soft, nontender, no hepatosplenomegaly, no distention.  Skin: Intact without lesions or rashes.  Neurologic: Alert and oriented x 3.  Psych: Normal  affect. Extremities: No clubbing or cyanosis.  HEENT: Normal.    Telemetry   Sinus Tach, 100s. Personally reviewed   Labs    CBC Recent Labs    08/01/21 0516 08/02/21 0211  WBC 13.2* 15.4*  HGB 10.2* 10.1*  HCT 31.5* 30.2*  MCV 97.2 98.1  PLT 234 621   Basic Metabolic Panel Recent Labs    08/01/21 0516 08/01/21 1507 08/02/21 0211  NA 132* 135 133*  K 4.2 4.4 4.5  CL 98 98 99  CO2 26 27 25   GLUCOSE 102* 107* 99  BUN 27* 26* 26*  CREATININE 2.27* 2.18* 2.11*  CALCIUM 8.8* 8.8* 8.8*  MG 2.4  --  2.4  PHOS 3.0 2.8 3.0   Liver Function Tests Recent Labs    08/01/21 1507 08/02/21 0211  ALBUMIN 3.3* 3.2*   No results for input(s): LIPASE, AMYLASE in the last 72 hours. Cardiac Enzymes No results for input(s): CKTOTAL, CKMB, CKMBINDEX, TROPONINI in the last 72 hours.  BNP: BNP (last 3 results) Recent Labs    03/04/21 1222 06/23/21 1807 07/22/21 1755  BNP 543.0* 357.0* 510.0*    ProBNP (last 3 results) No results for input(s): PROBNP in the last 8760 hours.   D-Dimer No results for input(s): DDIMER in the last 72 hours. Hemoglobin A1C No results for input(s): HGBA1C in the last 72 hours. Fasting Lipid Panel No results for input(s): CHOL,  HDL, LDLCALC, TRIG, CHOLHDL, LDLDIRECT in the last 72 hours. Thyroid Function Tests No results for input(s): TSH, T4TOTAL, T3FREE, THYROIDAB in the last 72 hours.  Invalid input(s): FREET3  Other results:   Imaging    No results found.   Medications:     Scheduled Medications:  Chlorhexidine Gluconate Cloth  6 each Topical Daily   cholecalciferol  1,000 Units Oral Daily   heparin injection (subcutaneous)  5,000 Units Subcutaneous Q8H   midodrine  10 mg Oral TID WC   pantoprazole  40 mg Oral Daily   sodium chloride flush  3 mL Intravenous Q12H    Infusions:   prismasol BGK 4/2.5 400 mL/hr at 08/01/21 1643    prismasol BGK 4/2.5 200 mL/hr at 08/01/21 1659   heparin 10,000 units/ 20 mL infusion  syringe 500 Units/hr (08/02/21 0207)   milrinone 0.125 mcg/kg/min (08/02/21 0600)   prismasol BGK 4/2.5 1,500 mL/hr at 08/02/21 0029    PRN Medications: camphor-menthol, heparin, heparin, HYDROmorphone    Assessment/Plan   1. Acute on chronic diastolic CHF: With prominent RV dysfunction.  Echo from 11/22 reviewed, EF 60-65%, normal RV systolic function with mild dilation, D-shaped septum consistent with RV pressure/volume overload. Patient is markedly volume overloaded with anasarca.  Unfortunately, creatinine is now up to 3.68. RHC  w/ markedly elevated left and right filling pressure, mod pulmonary venous HTN and preserved CO. PAPi low at 0.82. Minimal response to maximal diuretics and milrinone 0.125, CVVH started.  This morning, she remains on milrinone 0.125 with co-ox 73%.  CVVH, UF pulling net negative 200 cc/hr, weight coming down.  She remains very volume overloaded with CVP 26-27.  SBP 90s.  She is not making urine.  - Continue CVVH pulling 200 cc/hr net negative.  - I do not think we are helping much with milrinone, will stop.   - Increase midodrine to 10 mg tid.  2. AKI on CKD stage IIIb: Baseline creatinine 1.9-2.3, bumped to 3.68 in the setting of severe volume overload.  Suspect cardiorenal syndrome. RHC w/ normal CO but low PAPI suggesting RV dysfunction.  Now on CVVH.  Anuric.  - Nephrology following. Continue CVVH as above.  - I suspect she is going to need long term HD.  3. Cirrhosis: Uncertain etiology, labs negative for viral hepatitis.  Not a heavy drinker.  May be due to NAFLD or RV failure.  4. OSA: Continue CPAP at night.  5. Hyponatremia: Resolved with CVVH.  6. Morbid obesity - Body mass index is 51.78 kg/m. 7. Left ankle pain: Patient has history of gout.  - Send uric acid level, if elevated will treat with prednisone course.   CRITICAL CARE Performed by: Loralie Champagne  Total critical care time: 35 minutes  Critical care time was exclusive of separately  billable procedures and treating other patients.  Critical care was necessary to treat or prevent imminent or life-threatening deterioration.  Critical care was time spent personally by me on the following activities: development of treatment plan with patient and/or surrogate as well as nursing, discussions with consultants, evaluation of patient's response to treatment, examination of patient, obtaining history from patient or surrogate, ordering and performing treatments and interventions, ordering and review of laboratory studies, ordering and review of radiographic studies, pulse oximetry and re-evaluation of patient's condition.  Length of Stay: [place], MD  08/02/2021, 7:36 AM  Advanced Heart Failure Team Pager [phone] (M-F; 7a - 5p)  Please contact [facility] for night-coverage after hours (5p -  7a ) and weekends on [email or web address]

## 2021-08-02 NOTE — Plan of Care (Signed)
  Problem: Education: Goal: Ability to demonstrate management of disease process will improve Outcome: Progressing Goal: Ability to verbalize understanding of medication therapies will improve Outcome: Progressing Goal: Individualized Educational Video(s) Outcome: Progressing   Problem: Cardiac: Goal: Ability to achieve and maintain adequate cardiopulmonary perfusion will improve Outcome: Progressing   Problem: Education: Goal: Knowledge of General Education information will improve Description Including pain rating scale, medication(s)/side effects and non-pharmacologic comfort measures Outcome: Progressing   

## 2021-08-02 NOTE — Evaluation (Signed)
Physical Therapy Evaluation Patient Details Name: Lauren Osborn MRN: [id number] DOB: Nov 13, 1975 Today's Date: 08/02/2021  History of Present Illness  45 yo admitted 11/18 with CHF, AKI on CKD, cirrhosis. 11/22 Rt heart cath. 11/25 CRRT started. PMhx: HTn, CHF, OSA, gout, obesity  Clinical Impression  Pt pleasant and eager to get up from chair after feeling stiff from sitting. Pt with significant weakness, edema and requires 2 person assist with stedy for mobility currently. Pt lives with daughter who works and was able to walk and care for herself prior to admission. Pt will benefit from acute therapy to maximize mobility, safety and function to decrease burden of care. Encouraged OOB daily with nursing staff and pt confirmed she is not yet able to attempt standing without assist of stedy.         Recommendations for follow up therapy are one component of a multi-disciplinary discharge planning process, led by the attending physician.  Recommendations may be updated based on patient status, additional functional criteria and insurance authorization.  Follow Up Recommendations Skilled nursing-short term rehab (<3 hours/day)    Assistance Recommended at Discharge Frequent or constant Supervision/Assistance  Functional Status Assessment Patient has had a recent decline in their functional status and demonstrates the ability to make significant improvements in function in a reasonable and predictable amount of time.  Equipment Recommendations  Wheelchair (measurements PT);BSC/3in1;Rolling walker (2 wheels)    Recommendations for Other Services OT consult     Precautions / Restrictions Precautions Precautions: Fall;Other (comment) Precaution Comments: Rt IJ CRRT, painful and edematous legs      Mobility  Bed Mobility Overal bed mobility: Needs Assistance Bed Mobility: Sit to Supine       Sit to supine: Max assist   General bed mobility comments: max assist to lift bil LE onto  surface with +2 for lines and safety to assist with guiding trunk. Mod +2 to scoot hips in bed    Transfers Overall transfer level: Needs assistance   Transfers: Sit to/from Stand;Bed to chair/wheelchair/BSC Sit to Stand: Mod assist;Min assist           General transfer comment: pt in recliner on arrival and initial stand required mod +2 assist with stedy to rise from recliner. From stedy pad min +2 with pt able to transition recliner>BSC>bed via stedy with heavy reliance on bil UE to pull forward and pt maintaining hip and trunk flexion in standing. Increased time to position feet on stedy pads with max assist for pericare Transfer via Lift Equipment: Stedy  Ambulation/Gait               General Gait Details: unable  Science writer    Modified Rankin (Stroke Patients Only)       Balance Overall balance assessment: Needs assistance   Sitting balance-Leahy Scale: Poor Sitting balance - Comments: EOB with UE support   Standing balance support: Reliant on assistive device for balance;Bilateral upper extremity supported Standing balance-Leahy Scale: Zero                               Pertinent Vitals/Pain Pain Assessment: Faces Pain Score: 4  Pain Location: bil legs with repositioning and movement Pain Descriptors / Indicators: Aching;Guarding;Tightness Pain Intervention(s): Limited activity within patient's tolerance;Monitored during session;Repositioned    Home Living Family/patient expects to be discharged to:: Private residence Living Arrangements: Children (lives with  daughter and her fiance. Daughter works and fiance is in high school) Available Help at Discharge: Family;Available PRN/intermittently Type of Home: House Home Access: Stairs to enter Entrance Stairs-Rails: Psychiatric nurse of Steps: 7   Home Layout: One level Home Equipment: None      Prior Function Prior Level of Function :  Independent/Modified Independent             Mobility Comments: pt reports limited mobility with shuffling and still working as a Presenter, broadcasting in [employer], ADLs Comments: Lives with daughter and they share housework and cooking     Journalist, newspaper        Extremity/Trunk Assessment   Upper Extremity Assessment Upper Extremity Assessment: Generalized weakness    Lower Extremity Assessment Lower Extremity Assessment: Generalized weakness (bil LE edema with painful left ankle as well as stiff joints needing increased time and effort to move. Grossly 2/5 bil LE strength with limited ROM)    Cervical / Trunk Assessment Cervical / Trunk Assessment: Lordotic  Communication   Communication: No difficulties  Cognition Arousal/Alertness: Awake/alert Behavior During Therapy: WFL for tasks assessed/performed Overall Cognitive Status: Within Functional Limits for tasks assessed                                          General Comments      Exercises     Assessment/Plan    PT Assessment Patient needs continued PT services  PT Problem List Decreased strength;Decreased mobility;Decreased safety awareness;Decreased activity tolerance;Decreased balance;Decreased knowledge of use of DME;Obesity;Pain       PT Treatment Interventions Gait training;DME instruction;Therapeutic exercise;Balance training;Functional mobility training;Therapeutic activities;Patient/family education;Neuromuscular re-education    PT Goals (Current goals can be found in the Care Plan section)  Acute Rehab PT Goals Patient Stated Goal: be able to return home and to work PT Goal Formulation: With patient Time For Goal Achievement: 08/16/21 Potential to Achieve Goals: Fair    Frequency Min 3X/week   Barriers to discharge Decreased caregiver support      Co-evaluation               AM-PAC PT "6 Clicks" Mobility  Outcome Measure Help needed turning from your back to your side while  in a flat bed without using bedrails?: A Lot Help needed moving from lying on your back to sitting on the side of a flat bed without using bedrails?: A Lot Help needed moving to and from a bed to a chair (including a wheelchair)?: Total Help needed standing up from a chair using your arms (e.g., wheelchair or bedside chair)?: Total Help needed to walk in hospital room?: Total Help needed climbing 3-5 steps with a railing? : Total 6 Click Score: 8    End of Session   Activity Tolerance: Patient tolerated treatment well Patient left: in bed;with call bell/phone within reach;with nursing/sitter in room Nurse Communication: Mobility status PT Visit Diagnosis: Other abnormalities of gait and mobility (R26.89);Difficulty in walking, not elsewhere classified (R26.2);Muscle weakness (generalized) (M62.81)    Time: 7062-3762 PT Time Calculation (min) (ACUTE ONLY): 29 min   Charges:   PT Evaluation $PT Eval Moderate Complexity: 1 Mod PT Treatments $Therapeutic Activity: 8-22 mins        Jazsmine Macari P, PT Acute Rehabilitation Services Pager: [phone] Office: [place] B Zaydn [place]ridge 08/02/2021, 1:02 PM

## 2021-08-03 DIAGNOSIS — I5033 Acute on chronic diastolic (congestive) heart failure: Secondary | ICD-10-CM | POA: Diagnosis not present

## 2021-08-03 LAB — RENAL FUNCTION PANEL
Albumin: 3.1 g/dL — ABNORMAL LOW (ref 3.5–5.0)
Albumin: 3.3 g/dL — ABNORMAL LOW (ref 3.5–5.0)
Anion gap: 7 (ref 5–15)
Anion gap: 9 (ref 5–15)
BUN: 26 mg/dL — ABNORMAL HIGH (ref 6–20)
BUN: 28 mg/dL — ABNORMAL HIGH (ref 6–20)
CO2: 25 mmol/L (ref 22–32)
CO2: 25 mmol/L (ref 22–32)
Calcium: 8.8 mg/dL — ABNORMAL LOW (ref 8.9–10.3)
Calcium: 9 mg/dL (ref 8.9–10.3)
Chloride: 97 mmol/L — ABNORMAL LOW (ref 98–111)
Chloride: 98 mmol/L (ref 98–111)
Creatinine, Ser: 2.09 mg/dL — ABNORMAL HIGH (ref 0.44–1.00)
Creatinine, Ser: 1.91 mg/dL — ABNORMAL HIGH (ref 0.44–1.00)
GFR, Estimated: 29 mL/min — ABNORMAL LOW (ref >60)
GFR, Estimated: 32 mL/min — ABNORMAL LOW (ref >60)
Glucose, Bld: 106 mg/dL — ABNORMAL HIGH (ref 70–99)
Glucose, Bld: 114 mg/dL — ABNORMAL HIGH (ref 70–99)
Phosphorus: 3.5 mg/dL (ref 2.5–4.6)
Phosphorus: 3.2 mg/dL (ref 2.5–4.6)
Potassium: 4.9 mmol/L (ref 3.5–5.1)
Potassium: 5.1 mmol/L (ref 3.5–5.1)
Sodium: 129 mmol/L — ABNORMAL LOW (ref 135–145)
Sodium: 132 mmol/L — ABNORMAL LOW (ref 135–145)

## 2021-08-03 LAB — COOXEMETRY PANEL
Carboxyhemoglobin: 1.8 % — ABNORMAL HIGH (ref 0.5–1.5)
Methemoglobin: 1.1 % (ref 0.0–1.5)
O2 Saturation: 68.9 %
Total hemoglobin: 10 g/dL — ABNORMAL LOW (ref 12.0–16.0)

## 2021-08-03 LAB — CBC
HCT: 29.8 % — ABNORMAL LOW (ref 36.0–46.0)
Hemoglobin: 9.9 g/dL — ABNORMAL LOW (ref 12.0–15.0)
MCH: 32.7 pg (ref 26.0–34.0)
MCHC: 33.2 g/dL (ref 30.0–36.0)
MCV: 98.3 fL (ref 80.0–100.0)
Platelets: 213 10*3/uL (ref 150–400)
RBC: 3.03 MIL/uL — ABNORMAL LOW (ref 3.87–5.11)
RDW: 16.2 % — ABNORMAL HIGH (ref 11.5–15.5)
WBC: 13.8 10*3/uL — ABNORMAL HIGH (ref 4.0–10.5)
nRBC: 0 % (ref 0.0–0.2)

## 2021-08-03 LAB — APTT: aPTT: 46 seconds — ABNORMAL HIGH (ref 24–36)

## 2021-08-03 LAB — MAGNESIUM: Magnesium: 2.6 mg/dL — ABNORMAL HIGH (ref 1.7–2.4)

## 2021-08-03 MED ORDER — POLYETHYLENE GLYCOL 3350 17 G PO PACK
17.0000 g | PACK | Freq: Every day | ORAL | Status: DC | PRN
  Administered 2021-08-18 (×2): 17 g via ORAL
  Filled 2021-08-03 (×2): qty 1

## 2021-08-03 MED ORDER — SENNOSIDES-DOCUSATE SODIUM 8.6-50 MG PO TABS
1.0000 | ORAL_TABLET | Freq: Every day | ORAL | Status: DC
  Administered 2021-08-03 – 2021-08-19 (×15): 1 via ORAL
  Filled 2021-08-03 (×15): qty 1

## 2021-08-03 NOTE — Plan of Care (Signed)
  Problem: Education: Goal: Ability to demonstrate management of disease process will improve Outcome: Progressing Goal: Ability to verbalize understanding of medication therapies will improve Outcome: Progressing Goal: Individualized Educational Video(s) Outcome: Progressing   Problem: Education: Goal: Knowledge of General Education information will improve Description: Including pain rating scale, medication(s)/side effects and non-pharmacologic comfort measures Outcome: Progressing   Problem: Health Behavior/Discharge Planning: Goal: Ability to manage health-related needs will improve Outcome: Progressing   Problem: Clinical Measurements: Goal: Ability to maintain clinical measurements within normal limits will improve Outcome: Progressing Goal: Will remain free from infection Outcome: Progressing Goal: Diagnostic test results will improve Outcome: Progressing Goal: Respiratory complications will improve Outcome: Progressing Goal: Cardiovascular complication will be avoided Outcome: Progressing   Problem: Activity: Goal: Risk for activity intolerance will decrease Outcome: Progressing   Problem: Nutrition: Goal: Adequate nutrition will be maintained Outcome: Progressing   Problem: Coping: Goal: Level of anxiety will decrease Outcome: Progressing   Problem: Elimination: Goal: Will not experience complications related to bowel motility Outcome: Progressing Goal: Will not experience complications related to urinary retention Outcome: Progressing   Problem: Pain Managment: Goal: General experience of comfort will improve Outcome: Progressing   Problem: Safety: Goal: Ability to remain free from injury will improve Outcome: Progressing   Problem: Skin Integrity: Goal: Risk for impaired skin integrity will decrease Outcome: Progressing   Problem: Cardiac: Goal: Ability to achieve and maintain adequate cardiopulmonary perfusion will improve Outcome:  Progressing

## 2021-08-03 NOTE — Progress Notes (Signed)
RT refusing cpap for the night. Pt stated she would rather be placed on O2 throughout the night.

## 2021-08-03 NOTE — Progress Notes (Signed)
Orthopedic Tech Progress Note Patient Details:  Lauren Osborn Dec 15, 1975 [id number]  Ortho Devices Type of Ortho Device: Haematologist Ortho Device/Splint Location: Bi LE Ortho Device/Splint Interventions: Application   Post Interventions Patient Tolerated: Well Instructions Provided: Adjustment of device  Lauren Osborn [name] 08/03/2021, 4:10 PM

## 2021-08-03 NOTE — Progress Notes (Signed)
Patient ID: Lauren Osborn, female   DOB: 01/31/1976, 45 y.o.   MRN: [id number]     Advanced Heart Failure Rounding Note  PCP-Cardiologist: Lauren Lesches, MD   Subjective:    [name] 11/22 w/ markedly elevated left and right filling pressure, mod pulmonary venous HTN and preserved CO.  She did not respond to maximal diuretics + milrinone 0.125, CVVH started.   She is now off milrinone, co-ox 69%.  Weight down 6 lbs, currently pulling 200/hr net UF via CVVH. CVP still high, 25-26 this morning.  Ankle/knee pain better with prednisone to treat gout.     [place] 07/26/21  Right Heart Pressures RHC Procedural Findings: Hemodynamics (mmHg) RA mean 35 RV 57/28, 35 PA 58/29, mean 46 PCWP mean 42  Oxygen saturations: PA 70% AO 99%  Cardiac Output (Fick) 7.04  Cardiac Index (Fick) 3.01 PVR < 1 WU PAPI < 1    Objective:   Weight Range: 129.8 kg Body mass index is 50.69 kg/m.   Vital Signs:   Temp:  [97.4 F (36.3 C)-98.4 F (36.9 C)] 97.5 F (36.4 C) (11/29 2300) Pulse Rate:  [88-109] 95 (11/30 0700) Resp:  [12-39] 25 (11/30 0700) BP: (86-109)/(57-88) 100/69 (11/30 0700) SpO2:  [93 %-100 %] 100 % (11/30 0700) Weight:  [129.8 kg] 129.8 kg (11/30 0600) Last BM Date: 08/02/21  Weight change: Filed Weights   08/02/21 0530 08/02/21 0700 08/03/21 0600  Weight: 131.7 kg (S) 132.6 kg 129.8 kg    Intake/Output:   Intake/Output Summary (Last 24 hours) at 08/03/2021 0818 Last data filed at 08/03/2021 0700 Gross per 24 hour  Intake 494.19 ml  Output 5861 ml  Net -5366.81 ml      Physical Exam   CVP 25-26 General: NAD Neck: JVP 16+, no thyromegaly or thyroid nodule.  Lungs: Clear to auscultation bilaterally with normal respiratory effort. CV: Nondisplaced PMI.  Heart regular S1/S2, no S3/S4, no murmur.  2+ edema to thighs.  Abdomen: Soft, nontender, no hepatosplenomegaly, no distention.  Skin: Intact without lesions or rashes.  Neurologic: Alert and oriented x 3.   Psych: Normal affect. Extremities: No clubbing or cyanosis.  HEENT: Normal.    Telemetry   NSR 90s. Personally reviewed   Labs    CBC Recent Labs    08/02/21 0211 08/03/21 0314  WBC 15.4* 13.8*  HGB 10.1* 9.9*  HCT 30.2* 29.8*  MCV 98.1 98.3  PLT 226 876   Basic Metabolic Panel Recent Labs    08/02/21 0211 08/02/21 1506 08/03/21 0314  NA 133* 133* 129*  K 4.5 4.6 4.9  CL 99 98 97*  CO2 25 27 25   GLUCOSE 99 108* 106*  BUN 26* 24* 26*  CREATININE 2.11* 2.14* 2.09*  CALCIUM 8.8* 8.8* 8.8*  MG 2.4  --  2.6*  PHOS 3.0 3.2 3.5   Liver Function Tests Recent Labs    08/02/21 1506 08/03/21 0314  ALBUMIN 3.4* 3.1*   No results for input(s): LIPASE, AMYLASE in the last 72 hours. Cardiac Enzymes No results for input(s): CKTOTAL, CKMB, CKMBINDEX, TROPONINI in the last 72 hours.  BNP: BNP (last 3 results) Recent Labs    03/04/21 1222 06/23/21 1807 07/22/21 1755  BNP 543.0* 357.0* 510.0*    ProBNP (last 3 results) No results for input(s): PROBNP in the last 8760 hours.   D-Dimer No results for input(s): DDIMER in the last 72 hours. Hemoglobin A1C No results for input(s): HGBA1C in the last 72 hours. Fasting Lipid Panel No results for  input(s): CHOL, HDL, LDLCALC, TRIG, CHOLHDL, LDLDIRECT in the last 72 hours. Thyroid Function Tests No results for input(s): TSH, T4TOTAL, T3FREE, THYROIDAB in the last 72 hours.  Invalid input(s): FREET3  Other results:   Imaging    No results found.   Medications:     Scheduled Medications:  (feeding supplement) PROSource Plus  30 mL Oral TID BM   vitamin C  500 mg Oral Daily   Chlorhexidine Gluconate Cloth  6 each Topical Daily   cholecalciferol  1,000 Units Oral Daily   heparin injection (subcutaneous)  5,000 Units Subcutaneous Q8H   midodrine  10 mg Oral TID WC   multivitamin  1 tablet Oral QHS   pantoprazole  40 mg Oral Daily   predniSONE  40 mg Oral Q breakfast   Followed by   Derrill Memo ON  08/05/2021] predniSONE  20 mg Oral Q breakfast   sodium chloride flush  3 mL Intravenous Q12H    Infusions:   prismasol BGK 4/2.5 400 mL/hr at 08/03/21 0609    prismasol BGK 4/2.5 200 mL/hr at 08/03/21 2202   heparin 10,000 units/ 20 mL infusion syringe 750 Units/hr (08/02/21 2117)   prismasol BGK 4/2.5 1,500 mL/hr at 08/03/21 0609    PRN Medications: camphor-menthol, heparin, heparin, HYDROmorphone    Assessment/Plan   1. Acute on chronic diastolic CHF: With prominent RV dysfunction.  Echo from 11/22 reviewed, EF 60-65%, normal RV systolic function with mild dilation, D-shaped septum consistent with RV pressure/volume overload. Patient is markedly volume overloaded with anasarca.  Unfortunately, patient developed worsening AKI with attempted diuresis. RHC  w/ markedly elevated left and right filling pressure, mod pulmonary venous HTN and preserved CO. PAPi low at 0.82. Minimal response to maximal diuretics and milrinone 0.125, CVVH started.  She is now off milrinone with co-ox 69%.  CVVH ongoing, UF pulling net negative 200 cc/hr, weight coming down.  She remains very volume overloaded with CVP 25-26.  SBP 90s-100s.  She is not making urine.  - Continue CVVH pulling 200 cc/hr net negative today.   - Continue midodrine 10 mg tid.  2. AKI on CKD stage IIIb: Baseline creatinine 1.9-2.3, bumped to 3.68 in the setting of severe volume overload.  Suspect cardiorenal syndrome. RHC w/ normal CO but low PAPI suggesting RV dysfunction.  Now on CVVH.  Anuric.  - Nephrology following. Continue CVVH as above.  - I suspect she is going to need long term HD.  3. Cirrhosis: Uncertain etiology, labs negative for viral hepatitis.  Not a heavy drinker.  May be due to NAFLD or RV failure.  4. OSA: Continue CPAP at night.  5. Hyponatremia: Resolved with CVVH.  6. Morbid obesity - Body mass index is 50.69 kg/m. 7. Left ankle pain: Patient has history of gout. Symptoms improved with prednisone course for  gout.   Work with PT.   CRITICAL CARE Performed by: Loralie Champagne  Total critical care time: 35 minutes  Critical care time was exclusive of separately billable procedures and treating other patients.  Critical care was necessary to treat or prevent imminent or life-threatening deterioration.  Critical care was time spent personally by me on the following activities: development of treatment plan with patient and/or surrogate as well as nursing, discussions with consultants, evaluation of patient's response to treatment, examination of patient, obtaining history from patient or surrogate, ordering and performing treatments and interventions, ordering and review of laboratory studies, ordering and review of radiographic studies, pulse oximetry and re-evaluation of patient's condition.  Length of Stay: [place], MD  08/03/2021, 8:18 AM  Advanced Heart Failure Team Pager [phone] (M-F; 7a - 5p)  Please contact [facility] for night-coverage after hours (5p -7a ) and weekends on [email or web address]

## 2021-08-03 NOTE — TOC Initial Note (Signed)
Transition of Care [facility]) - Initial/Assessment Note    Patient Details  Name: Lauren Osborn MRN: [id number] Date of Birth: 07/05/1976  Transition [facility] Care [facility]) CM/SW Contact:    [place], [place] Phone Number: 08/03/2021, 10:40 AM  Clinical Narrative:                 CSW received consult for possible SNF placement at time of discharge. CSW spoke with the patient at bedside and completed a very brief SDOH with the patient who denied having any needs at this time. Patient reported she does have a PCP and she can get to the pharmacy to pick up her medications. CSW spoke with patient and she reported that her daughter lives with her. Patient reported that patient's daughter is currently unable to care for patient at their home given patient's current physical needs and fall risk. Patient expressed understanding of PT recommendation and is maybe agreeable to SNF placement at time of discharge but would prefer to go home but understands if she needs rehab before returning home. Patient reports preference for somewhere closer to home. CSW discussed insurance authorization process and will provide Medicare SNF ratings list. [name] is agreeable for the CSW to fax out clinicals to the SNF's. Patient has received COVID vaccines. Patient expressed being hopeful for rehab and to feel better soon. No further questions reported at this time.   Skilled Nursing Rehab Facilities-   [email or web address]   Ratings out of 5 possible   Name Address  Phone # [place] Inspection Overall  [facility] [address], [place] 5 5 2 4   [facility]  [place], [place] [phone] 4 2 5 5   [facility] [place], [place] 4 1 1 1   [place], [place] 2 2 4 4   [facility] [address], [place] 2 1 1 1   [place] [address], [place] 3 1 4 3   [facility] [address], [place] 5 2 2 3   [facility] [address] [place] 4 1 2 1   [facility] [place] 5 1 2 2   [facility] [phone], [place] [phone] 4 1 1 1   [facility] [address] [facility] [phone] 4 1 2 1   [place] [phone] 4 1 1 1       Expected Discharge Plan: [place] Barriers to Discharge: Continued Medical Work up   Patient Goals and CMS Choice Patient states their goals for this hospitalization and ongoing recovery are:: return home CMS Medicare.gov Compare Post Acute Care list provided to:: Patient Choice offered to / list presented to : Patient  Expected Discharge Plan and Services Expected Discharge Plan: [place] In-house Referral: Clinical Social Work Discharge Planning Services: CM Consult   Living arrangements for the past 2 months: [place]                                      Prior Living Arrangements/Services Living arrangements for the past 2 months: Single Family Home Lives with:: Self, Adult Children Patient language and need for interpreter reviewed:: Yes Do you feel safe going back to the place where you live?: Yes  Need for Family Participation in Patient Care: No (Comment) Care giver support system in place?: No (comment) Current home services: DME (Cpap) Criminal Activity/Legal Involvement Pertinent to Current Situation/Hospitalization: No - Comment as needed  Activities of Daily Living Home Assistive Devices/Equipment: None ADL Screening (condition at time of admission) Patient's cognitive ability adequate to safely complete daily activities?: Yes Is the patient deaf or have difficulty hearing?: No Does the patient have difficulty seeing, even when  wearing glasses/contacts?: No Does the patient have difficulty concentrating, remembering, or making decisions?: No Patient able to express need for assistance with ADLs?: Yes Does the patient have difficulty dressing or bathing?: No Independently performs ADLs?: Yes (appropriate for developmental age) Does the patient have difficulty walking or climbing stairs?: No Weakness of Legs: None Weakness of Arms/Hands: None  Permission Sought/Granted Permission sought to share information with : Facility Art therapist granted to share information with : Yes, Verbal Permission Granted     Permission granted to share info w AGENCY: SNF's        Emotional Assessment Appearance:: Appears stated age Attitude/Demeanor/Rapport: Engaged Affect (typically observed): Pleasant Orientation: : Oriented to Self, Oriented to Place, Oriented to  Time, Oriented to Situation Alcohol / Substance Use: Not Applicable Psych Involvement: No (comment)  Admission diagnosis:  Anasarca [R60.1] Other ascites [R18.8] Acute exacerbation of CHF (congestive heart failure) ([place]) [I50.9] Left leg swelling [M79.89] Dyspnea, unspecified type [R06.00] Patient Active Problem List   Diagnosis Date Noted   Abdominal pain 07/23/2021   Vitamin D deficiency 07/23/2021   Hepatic cirrhosis ([place]) 07/23/2021   Hepatic hemangioma 07/23/2021   Right upper quadrant abdominal pain 07/22/2021   Rectal bleeding 07/22/2021   Shortness of breath 07/22/2021   Acute exacerbation of CHF (congestive heart failure) ([place]) 07/22/2021   CKD (chronic kidney disease), stage IV ([place]) 06/23/2021   Liver lesion 12/01/2020   Liver disease 12/01/2020   Elevated bilirubin 12/01/2020   OSA (obstructive sleep apnea) 11/16/2020   Acute on chronic heart failure with preserved ejection fraction (HFpEF) ([place]) 09/22/2020   Morbid obesity with BMI of 50.0-59.9, adult ([place]) 09/22/2020   Pulmonary edema 09/16/2020   Hypokalemia  09/16/2020   Prolonged QT interval 09/16/2020   Acute kidney injury ([place]) 09/16/2020   Elevated brain natriuretic peptide (BNP) level 09/16/2020   GERD (gastroesophageal reflux disease) 09/16/2020   Anasarca 09/16/2020   Super obese 09/16/2020   Normocytic anemia 04/23/2020   Family history of colon cancer 04/23/2020   Mass of upper outer quadrant of right breast 11/04/2019   S/P partial thyroidectomy 10/21/2018   Visit for routine gyn exam 07/03/2018   PCP:  [name], MD Pharmacy:   [place] [place] [address] [place] [address] Phone: [phone] Fax: [phone]     Social Determinants of Health (SDOH) Interventions Food Insecurity Interventions: Intervention Not Indicated Financial Strain Interventions: Intervention Not Indicated Housing Interventions: Intervention Not Indicated Transportation Interventions: Intervention Not Indicated  Readmission Risk Interventions Readmission Risk Prevention Plan 07/23/2021 06/24/2021  Transportation Screening Complete Complete  Home Care Screening Complete Complete  Medication Review (RN CM) Complete Complete  Some recent data might be hidden    Nameer Summer, MSW, LCSWA [phone] Heart Failure Social Worker

## 2021-08-03 NOTE — Progress Notes (Signed)
[facility] NEPHROLOGY PROGRESS NOTE  Assessment/ Plan:  # Acute kidney Injury on chronic kidney disease stage IIIb (baseline creatinine 1.9-2.3): Appears hemodynamically mediated in the setting of acute exacerbation of diastolic heart failure with worsening from ongoing diuresis.  Started on CRRT after refractory to diuretic therapy and currently tolerating net -200 cc/h; weight has improved but CVP remains high therefore plan to continue CRRT mainly for ultrafiltration.  All 4K bath and on fixed dose heparin.  She remains anuric without evidence of renal recovery therefore she will likely need dialysis.  Once volume is optimized with CRRT we will attempt IHD.  Discussed with the patient and cardiology team.  # Hyponatremia: Secondary to CHF exacerbation/impaired free water deficit and inappropriate ADH activation-improving on CRRT.  #  Acute exacerbation of diastolic heart failure: Refractory to diuretics and inotropes.  VQ scan negative for PE and right heart catheterization shows markedly elevated left and right heart filling pressures with moderate pulmonary venous hypertension and preserved cardiac output.  Echo with EF of 60 to 65%.  On CRRT for volume management.    #  Anemia: Hemoglobin and hematocrit slightly lower-unclear if this is dilutional from CHF exacerbation. Monitor hemoglobin.  # Hypokalemia: Secondary to diuretic induced losses, replaced/corrected via oral route.  Now managing with CRRT.  #Liver cirrhosis: Hepatitis viral infection negative.  May be due to right heart failure.  #Hypotension: Milrinone stopped by cardiology and increased midodrine to 10 mg 3 times daily on 11/29.  Blood pressure seems to be improving.    Subjective: Seen and examined in ICU.  CVP elevated to around 24.  No urine output.  Blood pressure seems to be better with midodrine.  Tolerating CRRT with UF around 200 cc an hour.  Discussed with ICU nurse.  Patient denies chest pain,  shortness of breath, nausea or vomiting. Objective Vital signs in last 24 hours: Vitals:   08/03/21 0600 08/03/21 0630 08/03/21 0700 08/03/21 0836  BP:  (!) 89/72 100/69   Pulse:  96 95   Resp:  (!) 32 (!) 25   Temp:    97.7 F (36.5 C)  TempSrc:    Oral  SpO2:  97% 100%   Weight: 129.8 kg     Height:       Weight change: -1.9 kg  Intake/Output Summary (Last 24 hours) at 08/03/2021 0906 Last data filed at 08/03/2021 0700 Gross per 24 hour  Intake 368.82 ml  Output 5654 ml  Net -5285.18 ml        Labs: Basic Metabolic Panel: Recent Labs  Lab 08/02/21 0211 08/02/21 1506 08/03/21 0314  NA 133* 133* 129*  K 4.5 4.6 4.9  CL 99 98 97*  CO2 25 27 25   GLUCOSE 99 108* 106*  BUN 26* 24* 26*  CREATININE 2.11* 2.14* 2.09*  CALCIUM 8.8* 8.8* 8.8*  PHOS 3.0 3.2 3.5    Liver Function Tests: Recent Labs  Lab 08/02/21 0211 08/02/21 1506 08/03/21 0314  ALBUMIN 3.2* 3.4* 3.1*    No results for input(s): LIPASE, AMYLASE in the last 168 hours. No results for input(s): AMMONIA in the last 168 hours. CBC: Recent Labs  Lab 08/01/21 0516 08/02/21 0211 08/03/21 0314  WBC 13.2* 15.4* 13.8*  HGB 10.2* 10.1* 9.9*  HCT 31.5* 30.2* 29.8*  MCV 97.2 98.1 98.3  PLT 234 226 213    Cardiac Enzymes: No results for input(s): CKTOTAL, CKMB, CKMBINDEX, TROPONINI in the last 168 hours. CBG: Recent Labs  Lab 08/02/21 1642  GLUCAP 92    Iron Studies: No results for input(s): IRON, TIBC, TRANSFERRIN, FERRITIN in the last 72 hours. Studies/Results: No results found.  Medications: Infusions:   prismasol BGK 4/2.5 400 mL/hr at 08/03/21 0609    prismasol BGK 4/2.5 200 mL/hr at 08/03/21 5188   heparin 10,000 units/ 20 mL infusion syringe 750 Units/hr (08/02/21 2117)   prismasol BGK 4/2.5 1,500 mL/hr at 08/03/21 0609    Scheduled Medications:  (feeding supplement) PROSource Plus  30 mL Oral TID BM   vitamin C  500 mg Oral Daily   Chlorhexidine Gluconate Cloth  6 each  Topical Daily   cholecalciferol  1,000 Units Oral Daily   heparin injection (subcutaneous)  5,000 Units Subcutaneous Q8H   midodrine  10 mg Oral TID WC   multivitamin  1 tablet Oral QHS   pantoprazole  40 mg Oral Daily   predniSONE  40 mg Oral Q breakfast   Followed by   Derrill Memo ON 08/05/2021] predniSONE  20 mg Oral Q breakfast   sodium chloride flush  3 mL Intravenous Q12H    have reviewed scheduled and prn medications.  Physical Exam: General: Sitting on chair comfortable, not in distress Heart:RRR, s1s2 nl Lungs: Clear bilateral anteriorly. Abdomen:soft, Non-tender, non-distended Extremities:LE pitting edema +, boot, not much improvement of edema. Dialysis Access: Temporary HD catheter  [place] Tanna Furry 08/03/2021,9:06 AM  LOS: 12 days

## 2021-08-03 NOTE — NC FL2 (Signed)
[place] MEDICAID FL2 LEVEL OF CARE SCREENING TOOL     IDENTIFICATION  Patient Name: Lauren Osborn Birthdate: 11-Mar-1976 Sex: female Admission Date (Current Location): 07/22/2021  [facility] and [place] Number:  [employer] and Address:  [facility] [place] [place] [address], [place], [place] [address]      Provider Number: [id number]  Attending Physician Name and Address:  Larey Dresser, MD  Relative Name and Phone Number:       Current Level of Care: Hospital Recommended Level of Care: [place] Prior Approval Number:    Date Approved/Denied:   PASRR Number: [id number]  Discharge Plan: SNF    Current Diagnoses: Patient Active Problem List   Diagnosis Date Noted   Abdominal pain 07/23/2021   Vitamin D deficiency 07/23/2021   Hepatic cirrhosis ([place]) 07/23/2021   Hepatic hemangioma 07/23/2021   Right upper quadrant abdominal pain 07/22/2021   Rectal bleeding 07/22/2021   Shortness of breath 07/22/2021   Acute exacerbation of CHF (congestive heart failure) ([place]) 07/22/2021   CKD (chronic kidney disease), stage IV ([place]) 06/23/2021   Liver lesion 12/01/2020   Liver disease 12/01/2020   Elevated bilirubin 12/01/2020   OSA (obstructive sleep apnea) 11/16/2020   Acute on chronic heart failure with preserved ejection fraction (HFpEF) ([place]) 09/22/2020   Morbid obesity with BMI of 50.0-59.9, adult ([place]) 09/22/2020   Pulmonary edema 09/16/2020   Hypokalemia 09/16/2020   Prolonged QT interval 09/16/2020   Acute kidney injury ([place]) 09/16/2020   Elevated brain natriuretic peptide (BNP) level 09/16/2020   GERD (gastroesophageal reflux disease) 09/16/2020   Anasarca 09/16/2020   Super obese 09/16/2020   Normocytic anemia 04/23/2020   Family history of colon cancer 04/23/2020   Mass of upper outer quadrant of right breast 11/04/2019   S/P partial thyroidectomy 10/21/2018   Visit for routine gyn exam 07/03/2018    Orientation  RESPIRATION BLADDER Height & Weight     Self, Time, Situation, Place  Normal Continent Weight: 286 lb 2.5 oz (129.8 kg) Height:  5\' 3"  (160 cm)  BEHAVIORAL SYMPTOMS/MOOD NEUROLOGICAL BOWEL NUTRITION STATUS      Continent Diet (Please see DC Summary)  AMBULATORY STATUS COMMUNICATION OF NEEDS Skin   Total Care (unable to ambulate) Verbally Normal                       Personal Care Assistance Level of Assistance  Bathing, Feeding, Dressing Bathing Assistance: Limited assistance Feeding assistance: Independent Dressing Assistance: Limited assistance Total Care Assistance:  (unable to ambulate)   Functional Limitations Info  Sight, Hearing, Speech Sight Info: Adequate Hearing Info: Adequate Speech Info: Adequate    SPECIAL CARE FACTORS FREQUENCY  PT (By licensed PT), OT (By licensed OT)     PT Frequency: 5x/week OT Frequency: 5x/week            Contractures Contractures Info: Not present    Additional Factors Info  Code Status, Allergies Code Status Info: Full Allergies Info: Oxycodone-acetaminophen           Current Medications (08/03/2021):  This is the current hospital active medication list Current Facility-Administered Medications  Medication Dose Route Frequency Provider Last Rate Last Admin    prismasol BGK 4/2.5 infusion   CRRT Continuous Elmarie Shiley, MD 400 mL/hr at 08/03/21 0609 New Bag at 08/03/21 [place] 4/2.5 infusion   CRRT Continuous Elmarie Shiley, MD 200 mL/hr at 08/03/21 [place] at 08/03/21  2094   (feeding supplement) PROSource Plus liquid 30 mL  30 mL Oral TID BM Larey Dresser, MD   30 mL at 08/03/21 7096   ascorbic acid (VITAMIN C) tablet 500 mg  500 mg Oral Daily Larey Dresser, MD   500 mg at 08/03/21 2836   camphor-menthol (SARNA) lotion   Topical PRN Donalynn Furlong, NP   Given at 07/24/21 1351   Chlorhexidine Gluconate Cloth 2 % PADS 6 each  6 each Topical Daily Modena Jansky, MD   6 each at 08/02/21 1000    cholecalciferol (VITAMIN D3) tablet 1,000 Units  1,000 Units Oral Daily Adefeso, Olad[place], DO   1,000 Units at 08/03/21 0939   heparin 10,000 units/ 20 mL infusion syringe  500-1,000 Units/hr CRRT Continuous Elmarie Shiley, MD 1.5 mL/hr at 08/03/21 0941 750 Units/hr at 08/03/21 0941   heparin injection 1,000-6,000 Units  1,000-6,000 Units CRRT PRN Elmarie Shiley, MD       heparin injection 5,000 Units  5,000 Units Subcutaneous Q8H Bensimhon, Shaune Pascal, MD   5,000 Units at 08/03/21 6294   heparinized saline (2000 units/L) primer fluid for CRRT   CRRT PRN Elmarie Shiley, MD   Given at 07/31/21 1425   HYDROmorphone (DILAUDID) tablet 1 mg  1 mg Oral Q4H PRN Donalynn Furlong, NP   1 mg at 08/03/21 0939   midodrine (PROAMATINE) tablet 10 mg  10 mg Oral TID WC Larey Dresser, MD   10 mg at 08/03/21 1133   multivitamin (RENA-VIT) tablet 1 tablet  1 tablet Oral QHS Larey Dresser, MD   1 tablet at 08/02/21 2125   pantoprazole (PROTONIX) EC tablet 40 mg  40 mg Oral Daily Adefeso, Oladapo, DO   40 mg at 08/03/21 0939   polyethylene glycol (MIRALAX / GLYCOLAX) packet 17 g  17 g Oral Daily PRN Larey Dresser, MD       predniSONE (DELTASONE) tablet 40 mg  40 mg Oral Q breakfast Larey Dresser, MD   40 mg at 08/03/21 7654   Followed by   Derrill Memo ON 08/05/2021] predniSONE (DELTASONE) tablet 20 mg  20 mg Oral Q breakfast Larey Dresser, MD       prismasol BGK 4/2.5 infusion   CRRT Continuous Elmarie Shiley, MD 1,500 mL/hr at 08/03/21 6503 New Bag at 08/03/21 5465   senna-docusate (Senokot-S) tablet 1 tablet  1 tablet Oral QHS Larey Dresser, MD       sodium chloride flush (NS) 0.9 % injection 3 mL  3 mL Intravenous Q12H Lyda Jester M, PA-C   3 mL at 08/02/21 2127     Discharge Medications: Please see discharge summary for a list of discharge medications.  Relevant Imaging Results:  Relevant Lab Results:   Additional Information SSN#: [id number]  Nayellie Sanseverino, LCSWA

## 2021-08-03 NOTE — Evaluation (Signed)
Occupational Therapy Evaluation Patient Details Name: Lauren Osborn MRN: [id number] DOB: 05-05-1976 Today's Date: 08/03/2021   History of Present Illness 45 yo admitted 11/18 with CHF, AKI on CKD, cirrhosis. 11/22 Rt heart cath. 11/25 CRRT started. PMhx: HTn, CHF, OSA, gout, obesity   Clinical Impression   PTA, pt lives with 60 y/o daughter and reports typically able to complete ADLs (occasional assist with showering tasks) and mobility Independently though daily tasks were becoming increasingly difficult. Pt works as a Presenter, broadcasting. Pt presents now with deficits in standing balance, endurance and overall strength. Pt able to progress basic transfers to min guard-Min A using RW today though difficult to lift B feet. Pt overall Setup for UB ADLs and Mod A for LB ADLs due to edema and flexibility impairments. As pt is significantly below baseline, recommend SNF rehab at DC. Pt agreeable for rehab but hopeful to progress to home. Will continue to follow acutely and update DC recs as appropriate.      Recommendations for follow up therapy are one component of a multi-disciplinary discharge planning process, led by the attending physician.  Recommendations may be updated based on patient status, additional functional criteria and insurance authorization.   Follow Up Recommendations  Skilled nursing-short term rehab (<3 hours/day)    Assistance Recommended at Discharge Intermittent Supervision/Assistance  Functional Status Assessment  Patient has had a recent decline in their functional status and demonstrates the ability to make significant improvements in function in a reasonable and predictable amount of time.  Equipment Recommendations  BSC/3in1;Other (comment);Tub/shower seat (Rolling walker)    Recommendations for Other Services       Precautions / Restrictions Precautions Precautions: Fall;Other (comment) Precaution Comments: Rt IJ CRRT, painful and edematous  legs Restrictions Weight Bearing Restrictions: No      Mobility Bed Mobility Overal bed mobility: Needs Assistance Bed Mobility: Supine to Sit;Sit to Supine     Supine to sit: Min assist Sit to supine: Mod assist   General bed mobility comments: Min A to get L LE fully off of bed, Mod A to get BLE back in bed    Transfers Overall transfer level: Needs assistance Equipment used: Rolling walker (2 wheels) Transfers: Sit to/from Stand;Bed to chair/wheelchair/BSC Sit to Stand: Min assist     Step pivot transfers: Min guard     General transfer comment: Min A for initial power up from bedside progressing to min guard. Min guard for slow steps to/from [facility] with RW - difficulty lifitng feet      Balance Overall balance assessment: Needs assistance   Sitting balance-Leahy Scale: Fair     Standing balance support: Reliant on assistive device for balance;Bilateral upper extremity supported Standing balance-Leahy Scale: Fair Standing balance comment: fair static standing, benefits from UE support for dynamic tasks                           ADL either performed or assessed with clinical judgement   ADL Overall ADL's : Needs assistance/impaired Eating/Feeding: Independent;Sitting   Grooming: Set up;Sitting Grooming Details (indicate cue type and reason): EOB Upper Body Bathing: Set up;Sitting   Lower Body Bathing: Moderate assistance;Sit to/from stand Lower Body Bathing Details (indicate cue type and reason): to bathe posterior region thoroughly, bathe B lower legs Upper Body Dressing : Set up;Sitting   Lower Body Dressing: Moderate assistance;Sit to/from stand Lower Body Dressing Details (indicate cue type and reason): assist for B socks, unable to reach Toilet  Transfer: Min guard;Stand-pivot;BSC/3in1;Rolling walker (2 wheels)   Toileting- Clothing Manipulation and Hygiene: Moderate assistance;Sit to/from stand Toileting - Clothing Manipulation Details  (indicate cue type and reason): assist for clothing mgmt, posterior hygiene             Vision Ability to See in Adequate Light: 0 Adequate Patient Visual Report: No change from baseline Vision Assessment?: No apparent visual deficits     Perception     Praxis      Pertinent Vitals/Pain Pain Assessment: Faces Faces Pain Scale: Hurts little more Pain Location: L LE and back with bed mobility Pain Descriptors / Indicators: Discomfort;Grimacing;Guarding Pain Intervention(s): Limited activity within patient's tolerance;Monitored during session     Hand Dominance Right   Extremity/Trunk Assessment Upper Extremity Assessment Upper Extremity Assessment: Overall WFL for tasks assessed   Lower Extremity Assessment Lower Extremity Assessment: Defer to PT evaluation   Cervical / Trunk Assessment Cervical / Trunk Assessment: Lordotic   Communication Communication Communication: No difficulties   Cognition Arousal/Alertness: Awake/alert Behavior During Therapy: WFL for tasks assessed/performed Overall Cognitive Status: Within Functional Limits for tasks assessed                                       General Comments       Exercises     Shoulder Instructions      Home Living Family/patient expects to be discharged to:: Private residence Living Arrangements: Children (82 y/o daughter and her boyfriend who is also 52 and in high school. daughter works) Available Help at Discharge: Family;Available PRN/intermittently Type of Home: House Home Access: Stairs to enter [employer] of Steps: 7 Entrance Stairs-Rails: Right;Left Home Layout: One level     Bathroom Shower/Tub: Teacher, early years/pre: Standard     Home Equipment: None          Prior Functioning/Environment Prior Level of Function : Independent/Modified Independent             Mobility Comments: pt reports limited mobility with shuffling and still working as  a Presenter, broadcasting in [place] ADLs Comments: Independent with dressing (including compression stockings) though takes a while, Intermittent assist for showering tasks due to SOB and difficulty reaching (stood for showers). Shares IADLs with daughter        OT Problem List: Decreased strength;Decreased activity tolerance;Impaired balance (sitting and/or standing);Decreased knowledge of use of DME or AE;Obesity      OT Treatment/Interventions: Self-care/ADL training;Therapeutic exercise;Energy conservation;DME and/or AE instruction;Therapeutic activities;Patient/family education;Balance training    OT Goals(Current goals can be found in the care plan section) Acute Rehab OT Goals Patient Stated Goal: progress well enough to go home OT Goal Formulation: With patient Time For Goal Achievement: 08/17/21 Potential to Achieve Goals: Good  OT Frequency: Min 2X/week   Barriers to D/C:            Co-evaluation              AM-PAC OT "6 Clicks" Daily Activity     Outcome Measure Help from another person eating meals?: None Help from another person taking care of personal grooming?: A Little Help from another person toileting, which includes using toliet, bedpan, or urinal?: A Lot Help from another person bathing (including washing, rinsing, drying)?: A Lot Help from another person to put on and taking off regular upper body clothing?: A Little Help from another person to put on and  taking off regular lower body clothing?: A Lot 6 Click Score: 16   End of Session Equipment Utilized During Treatment: Rolling walker (2 wheels) Nurse Communication: Mobility status;Other (comment) (RN present during session)  Activity Tolerance: Patient tolerated treatment well Patient left: in bed;with call bell/phone within reach;with nursing/sitter in room  OT Visit Diagnosis: Unsteadiness on feet (R26.81);Other abnormalities of gait and mobility (R26.89);Muscle weakness (generalized) (M62.81)                 Time: 2263-3354 OT Time Calculation (min): 38 min Charges:  OT General Charges $OT Visit: 1 Visit OT Evaluation $OT Eval Moderate Complexity: 1 Mod OT Treatments $Self Care/Home Management : 23-37 mins  Malachy Chamber, OTR/L Acute Rehab Services Office: [phone]   Lauren Osborn 08/03/2021, 2:00 PM

## 2021-08-04 DIAGNOSIS — I5033 Acute on chronic diastolic (congestive) heart failure: Secondary | ICD-10-CM | POA: Diagnosis not present

## 2021-08-04 LAB — RENAL FUNCTION PANEL
Albumin: 3.3 g/dL — ABNORMAL LOW (ref 3.5–5.0)
Albumin: 3.6 g/dL (ref 3.5–5.0)
Anion gap: 8 (ref 5–15)
Anion gap: 11 (ref 5–15)
BUN: 30 mg/dL — ABNORMAL HIGH (ref 6–20)
BUN: 32 mg/dL — ABNORMAL HIGH (ref 6–20)
CO2: 26 mmol/L (ref 22–32)
CO2: 23 mmol/L (ref 22–32)
Calcium: 9 mg/dL (ref 8.9–10.3)
Calcium: 8.9 mg/dL (ref 8.9–10.3)
Chloride: 96 mmol/L — ABNORMAL LOW (ref 98–111)
Chloride: 97 mmol/L — ABNORMAL LOW (ref 98–111)
Creatinine, Ser: 1.94 mg/dL — ABNORMAL HIGH (ref 0.44–1.00)
Creatinine, Ser: 1.85 mg/dL — ABNORMAL HIGH (ref 0.44–1.00)
GFR, Estimated: 32 mL/min — ABNORMAL LOW (ref >60)
GFR, Estimated: 33 mL/min — ABNORMAL LOW (ref >60)
Glucose, Bld: 87 mg/dL (ref 70–99)
Glucose, Bld: 111 mg/dL — ABNORMAL HIGH (ref 70–99)
Phosphorus: 3.2 mg/dL (ref 2.5–4.6)
Phosphorus: 3.1 mg/dL (ref 2.5–4.6)
Potassium: 4.7 mmol/L (ref 3.5–5.1)
Potassium: 5.2 mmol/L — ABNORMAL HIGH (ref 3.5–5.1)
Sodium: 130 mmol/L — ABNORMAL LOW (ref 135–145)
Sodium: 131 mmol/L — ABNORMAL LOW (ref 135–145)

## 2021-08-04 LAB — CBC
HCT: 30.6 % — ABNORMAL LOW (ref 36.0–46.0)
Hemoglobin: 9.8 g/dL — ABNORMAL LOW (ref 12.0–15.0)
MCH: 31.6 pg (ref 26.0–34.0)
MCHC: 32 g/dL (ref 30.0–36.0)
MCV: 98.7 fL (ref 80.0–100.0)
Platelets: 220 10*3/uL (ref 150–400)
RBC: 3.1 MIL/uL — ABNORMAL LOW (ref 3.87–5.11)
RDW: 16.5 % — ABNORMAL HIGH (ref 11.5–15.5)
WBC: 11.8 10*3/uL — ABNORMAL HIGH (ref 4.0–10.5)
nRBC: 0 % (ref 0.0–0.2)

## 2021-08-04 LAB — APTT: aPTT: 44 seconds — ABNORMAL HIGH (ref 24–36)

## 2021-08-04 LAB — POCT ACTIVATED CLOTTING TIME
Activated Clotting Time: 155 seconds
Activated Clotting Time: 167 seconds

## 2021-08-04 LAB — COOXEMETRY PANEL
Carboxyhemoglobin: 1.5 % (ref 0.5–1.5)
Methemoglobin: 0.8 % (ref 0.0–1.5)
O2 Saturation: 70.9 %
Total hemoglobin: 10.1 g/dL — ABNORMAL LOW (ref 12.0–16.0)

## 2021-08-04 LAB — MAGNESIUM: Magnesium: 2.5 mg/dL — ABNORMAL HIGH (ref 1.7–2.4)

## 2021-08-04 MED ORDER — ENSURE ENLIVE PO LIQD
237.0000 mL | Freq: Two times a day (BID) | ORAL | Status: DC
  Administered 2021-08-04 – 2021-08-19 (×21): 237 mL via ORAL

## 2021-08-04 NOTE — Progress Notes (Signed)
[facility] NEPHROLOGY PROGRESS NOTE  Assessment/ Plan:  # Acute kidney Injury on chronic kidney disease stage IIIb (baseline creatinine 1.9-2.3): Appears hemodynamically mediated in the setting of acute exacerbation of diastolic heart failure with worsening from ongoing diuresis.  Started on CRRT after refractory to diuretic therapy and currently tolerating net -200 cc/h; weight has improved but CVP remains high therefore plan to continue CRRT mainly for ultrafiltration.  All 4K bath and on fixed dose heparin.  She remains anuric without evidence of renal recovery therefore she will likely need dialysis.  Once volume is optimized with CRRT we will attempt IHD.  Discussed with the patient and cardiology team.  No changes today, BP still soft.  # Hyponatremia: Secondary to CHF exacerbation/impaired free water deficit and inappropriate ADH activation-improving on CRRT.  #  Acute exacerbation of diastolic heart failure: Refractory to diuretics and inotropes.  VQ scan negative for PE and right heart catheterization shows markedly elevated left and right heart filling pressures with moderate pulmonary venous hypertension and preserved cardiac output.  Echo with EF of 60 to 65%.  On CRRT for volume management.    #  Anemia: Hemoglobin and hematocrit slightly lower-unclear if this is dilutional from CHF exacerbation. Monitor hemoglobin.  # Hypokalemia: Secondary to diuretic induced losses, replaced/corrected via oral route.  Now managing with CRRT.  #Liver cirrhosis: Hepatitis viral infection negative.  May be due to right heart failure.  #Hypotension: Milrinone stopped by cardiology and increased midodrine to 10 mg 3 times daily on 11/29.  Blood pressure seems to be improving.    Subjective: Seen and examined in ICU.  Reports minimal amount of urine output but not months.  Tolerating CRRT well.  He still has lower extremity edema.  No nausea, vomiting, chest pain or shortness of  breath. Objective Vital signs in last 24 hours: Vitals:   08/04/21 0600 08/04/21 0737 08/04/21 0800 08/04/21 0807  BP: 94/82 100/79 98/83   Pulse: 87 90    Resp: 15 17 19    Temp:    (!) 97.1 F (36.2 C)  TempSrc:    Oral  SpO2: 97% 100%    Weight:      Height:       Weight change:   Intake/Output Summary (Last 24 hours) at 08/04/2021 0938 Last data filed at 08/04/2021 0800 Gross per 24 hour  Intake 840 ml  Output 6847 ml  Net -6007 ml        Labs: Basic Metabolic Panel: Recent Labs  Lab 08/03/21 0314 08/03/21 1524 08/04/21 0453  NA 129* 132* 130*  K 4.9 5.1 4.7  CL 97* 98 96*  CO2 25 25 26   GLUCOSE 106* 114* 87  BUN 26* 28* 30*  CREATININE 2.09* 1.91* 1.94*  CALCIUM 8.8* 9.0 9.0  PHOS 3.5 3.2 3.2    Liver Function Tests: Recent Labs  Lab 08/03/21 0314 08/03/21 1524 08/04/21 0453  ALBUMIN 3.1* 3.3* 3.3*    No results for input(s): LIPASE, AMYLASE in the last 168 hours. No results for input(s): AMMONIA in the last 168 hours. CBC: Recent Labs  Lab 08/01/21 0516 08/02/21 0211 08/03/21 0314 08/04/21 0453  WBC 13.2* 15.4* 13.8* 11.8*  HGB 10.2* 10.1* 9.9* 9.8*  HCT 31.5* 30.2* 29.8* 30.6*  MCV 97.2 98.1 98.3 98.7  PLT 234 226 213 220    Cardiac Enzymes: No results for input(s): CKTOTAL, CKMB, CKMBINDEX, TROPONINI in the last 168 hours. CBG: Recent Labs  Lab 08/02/21 1642  GLUCAP 92  Iron Studies: No results for input(s): IRON, TIBC, TRANSFERRIN, FERRITIN in the last 72 hours. Studies/Results: No results found.  Medications: Infusions:   prismasol BGK 4/2.5 400 mL/hr at 08/04/21 0902    prismasol BGK 4/2.5 200 mL/hr at 08/04/21 0901   heparin 10,000 units/ 20 mL infusion syringe 750 Units/hr (08/04/21 0006)   prismasol BGK 4/2.5 1,500 mL/hr at 08/04/21 7062    Scheduled Medications:  (feeding supplement) PROSource Plus  30 mL Oral TID BM   vitamin C  500 mg Oral Daily   Chlorhexidine Gluconate Cloth  6 each Topical Daily    cholecalciferol  1,000 Units Oral Daily   heparin injection (subcutaneous)  5,000 Units Subcutaneous Q8H   midodrine  10 mg Oral TID WC   multivitamin  1 tablet Oral QHS   pantoprazole  40 mg Oral Daily   [START ON 08/05/2021] predniSONE  20 mg Oral Q breakfast   senna-docusate  1 tablet Oral QHS   sodium chloride flush  3 mL Intravenous Q12H    have reviewed scheduled and prn medications.  Physical Exam: General: Sitting on chair comfortable, not in distress Heart:RRR, s1s2 nl Lungs: Clear bilateral anteriorly. Abdomen:soft, Non-tender, non-distended Extremities:LE pitting edema +, boot, not much improvement of edema. Dialysis Access: Temporary HD catheter  Eythan Jayne Reesa Chew Talli Kimmer 08/04/2021,9:38 AM  LOS: 13 days

## 2021-08-04 NOTE — Progress Notes (Signed)
Physical Therapy Treatment Patient Details Name: Lauren Osborn MRN: [id number] DOB: [birth year] Today's Date: 08/04/2021   History of Present Illness 45 yo admitted 11/18 with CHF, AKI on CKD, cirrhosis. 11/22 Rt heart cath. 11/25 CRRT started. PMhx: HTn, CHF, OSA, gout, obesity    PT Comments    The pt was able to demo great progress with OOB mobility, gait, and functional strengthening exercises at this time. She completed x10 sit-stand transfers through the session with most needing minA for management of lines only (no physical assist). The pt also completed multiple short bouts of ambulation (limited by CRRT lines) with use of RW, minA for line management only, and VSS. The pt remains very eager to progress and return home as medically able, will update d/c recommendations to HHPT as this is the pt's goal, and given today's progress I suspect she will continue to make reasonable progress towards this d/c plan and be safe to return home once medically stable.     Recommendations for follow up therapy are one component of a multi-disciplinary discharge planning process, led by the attending physician.  Recommendations may be updated based on patient status, additional functional criteria and insurance authorization.  Follow Up Recommendations  Home health PT     Assistance Recommended at Discharge Frequent or constant Supervision/Assistance  Equipment Recommendations  Wheelchair (measurements PT);BSC/3in1;Rolling walker (2 wheels);Wheelchair cushion (measurements PT)    Recommendations for Other Services       Precautions / Restrictions Precautions Precautions: Fall;Other (comment) Precaution Comments: Rt IJ CRRT, painful and edematous legs Restrictions Weight Bearing Restrictions: No     Mobility  Bed Mobility Overal bed mobility: Needs Assistance Bed Mobility: Sit to Supine       Sit to supine: Min assist;+2 for physical assistance   General bed mobility comments: minA  to BLE as well as cues for technique and management of CRRT    Transfers Overall transfer level: Needs assistance Equipment used: Rolling walker (2 wheels) Transfers: Sit to/from Stand;Bed to chair/wheelchair/BSC Sit to Stand: Min assist     Step pivot transfers: Min guard     General transfer comment: minA initially but pt able to complete multiple within session with progression to minG and no use of UE. 5x sit-stand in 32 sec.    Ambulation/Gait Ambulation/Gait assistance: Min guard Gait Distance (Feet): 6 Feet (+ 8 ft + 8 ft) Assistive device: Rolling walker (2 wheels) Gait Pattern/deviations: Step-through pattern;Decreased stride length;Trunk flexed Gait velocity: decreased Gait velocity interpretation: <1.31 ft/sec, indicative of household ambulator   General Gait Details: pt with slow but steady gait with minimal clearance and dependence on BUE support. able to complete short bout of gait from [facility] to bed, then x2 forwards and backwards with +2 for line management/safety    Balance Overall balance assessment: Needs assistance   Sitting balance-Leahy Scale: Fair Sitting balance - Comments: EOB with UE support   Standing balance support: No upper extremity supported Standing balance-Leahy Scale: Fair Standing balance comment: pt able to complete repeated sit-stand without UE support, BUE support for gait                            Cognition Arousal/Alertness: Awake/alert Behavior During Therapy: WFL for tasks assessed/performed Overall Cognitive Status: Within Functional Limits for tasks assessed  Exercises Other Exercises Other Exercises: 5xsit-stand in 32 sec, no use of hands from EOB    General Comments General comments (skin integrity, edema, etc.): VSS on RA, RN managing CRRT      Pertinent Vitals/Pain Pain Assessment: Faces Faces Pain Scale: Hurts a little bit Pain Location: CRRT site  at R IJ Pain Descriptors / Indicators: Discomfort;Grimacing;Guarding Pain Intervention(s): Limited activity within patient's tolerance;Monitored during session;Repositioned     PT Goals (current goals can now be found in the care plan section) Acute Rehab PT Goals Patient Stated Goal: be able to return home and to work PT Goal Formulation: With patient Time For Goal Achievement: 08/16/21 Potential to Achieve Goals: Fair Progress towards PT goals: Progressing toward goals    Frequency    Min 3X/week      PT Plan Discharge plan needs to be updated       AM-PAC PT "6 Clicks" Mobility   Outcome Measure  Help needed turning from your back to your side while in a flat bed without using bedrails?: A Little Help needed moving from lying on your back to sitting on the side of a flat bed without using bedrails?: A Lot Help needed moving to and from a bed to a chair (including a wheelchair)?: A Little Help needed standing up from a chair using your arms (e.g., wheelchair or bedside chair)?: A Little Help needed to walk in hospital room?: A Little Help needed climbing 3-5 steps with a railing? : A Lot 6 Click Score: 16    End of Session Equipment Utilized During Treatment: Gait belt Activity Tolerance: Patient tolerated treatment well Patient left: in bed;with call bell/phone within reach;with nursing/sitter in room Nurse Communication: Mobility status PT Visit Diagnosis: Other abnormalities of gait and mobility (R26.89);Difficulty in walking, not elsewhere classified (R26.2);Muscle weakness (generalized) (M62.81)     Time: 7124-5809 PT Time Calculation (min) (ACUTE ONLY): 24 min  Charges:  $Gait Training: 8-22 mins $Therapeutic Exercise: 8-22 mins                     West Carbo, PT, DPT   Acute Rehabilitation Department Pager #: [phone]   Sandra Cockayne 08/04/2021, 4:45 PM

## 2021-08-04 NOTE — Progress Notes (Signed)
Pt refusing cpap for the night. ?

## 2021-08-04 NOTE — Progress Notes (Addendum)
Patient ID: Lauren Osborn, female   DOB: 29-Jan-1976, 45 y.o.   MRN: [id number]     Advanced Heart Failure Rounding Note  PCP-Cardiologist: Rozann Lesches, MD   Subjective:    [place] 11/22 w/ markedly elevated left and right filling pressure, mod pulmonary venous HTN and preserved CO.  She did not respond to maximal diuretics + milrinone 0.125, CVVH started.   She is now off milrinone, co-ox 71%.    Remains anuric. 6.8L fluid removal yesterday w/ CVVH. Wt not charted yet for today. CVP remains high ~27.  CVVH currently pulling 300/hr  Sitting up in chair. Feels ok. Denies dyspnea. Gout pain improving.     [place] 07/26/21  Right Heart Pressures RHC Procedural Findings: Hemodynamics (mmHg) RA mean 35 RV 57/28, 35 PA 58/29, mean 46 PCWP mean 42  Oxygen saturations: PA 70% AO 99%  Cardiac Output (Fick) 7.04  Cardiac Index (Fick) 3.01 PVR < 1 WU PAPI < 1    Objective:   Weight Range: 129.8 kg Body mass index is 50.69 kg/m.   Vital Signs:   Temp:  [97.1 F (36.2 C)-98.2 F (36.8 C)] 97.1 F (36.2 C) (12/01 0807) Pulse Rate:  [71-107] 90 (12/01 0737) Resp:  [10-37] 19 (12/01 0800) BP: (80-118)/(58-86) 98/83 (12/01 0800) SpO2:  [92 %-100 %] 100 % (12/01 0737) Last BM Date: 08/03/21  Weight change: Filed Weights   08/02/21 0530 08/02/21 0700 08/03/21 0600  Weight: 131.7 kg (S) 132.6 kg 129.8 kg    Intake/Output:   Intake/Output Summary (Last 24 hours) at 08/04/2021 0945 Last data filed at 08/04/2021 0800 Gross per 24 hour  Intake 840 ml  Output 6847 ml  Net -6007 ml      Physical Exam   CVP 27  General:  fatigued appearing, obese F, sitting up in chair. No respiratory difficulty HEENT: normal Neck: supple. JVD to jaw, Rt IJ CVC. Carotids 2+ bilat; no bruits. No lymphadenopathy or thyromegaly appreciated. Cor: PMI nondisplaced. Regular rate & rhythm. No rubs, gallops or murmurs. Lungs: clear Abdomen: soft, nontender, nondistended. No hepatosplenomegaly.  No bruits or masses. Good bowel sounds. Extremities: no cyanosis, clubbing, rash, 2+ bilateral LE edema + unna boots  Neuro: alert & oriented x 3, cranial nerves grossly intact. moves all 4 extremities w/o difficulty. Affect pleasant.  Telemetry   NSR 90s. Personally reviewed   Labs    CBC Recent Labs    08/03/21 0314 08/04/21 0453  WBC 13.8* 11.8*  HGB 9.9* 9.8*  HCT 29.8* 30.6*  MCV 98.3 98.7  PLT 213 073   Basic Metabolic Panel Recent Labs    08/03/21 0314 08/03/21 1524 08/04/21 0453  NA 129* 132* 130*  K 4.9 5.1 4.7  CL 97* 98 96*  CO2 25 25 26   GLUCOSE 106* 114* 87  BUN 26* 28* 30*  CREATININE 2.09* 1.91* 1.94*  CALCIUM 8.8* 9.0 9.0  MG 2.6*  --  2.5*  PHOS 3.5 3.2 3.2   Liver Function Tests Recent Labs    08/03/21 1524 08/04/21 0453  ALBUMIN 3.3* 3.3*   No results for input(s): LIPASE, AMYLASE in the last 72 hours. Cardiac Enzymes No results for input(s): CKTOTAL, CKMB, CKMBINDEX, TROPONINI in the last 72 hours.  BNP: BNP (last 3 results) Recent Labs    03/04/21 1222 06/23/21 1807 07/22/21 1755  BNP 543.0* 357.0* 510.0*    ProBNP (last 3 results) No results for input(s): PROBNP in the last 8760 hours.   D-Dimer No results for input(s): DDIMER  in the last 72 hours. Hemoglobin A1C No results for input(s): HGBA1C in the last 72 hours. Fasting Lipid Panel No results for input(s): CHOL, HDL, LDLCALC, TRIG, CHOLHDL, LDLDIRECT in the last 72 hours. Thyroid Function Tests No results for input(s): TSH, T4TOTAL, T3FREE, THYROIDAB in the last 72 hours.  Invalid input(s): FREET3  Other results:   Imaging    No results found.   Medications:     Scheduled Medications:  (feeding supplement) PROSource Plus  30 mL Oral TID BM   vitamin C  500 mg Oral Daily   Chlorhexidine Gluconate Cloth  6 each Topical Daily   cholecalciferol  1,000 Units Oral Daily   heparin injection (subcutaneous)  5,000 Units Subcutaneous Q8H   midodrine  10 mg  Oral TID WC   multivitamin  1 tablet Oral QHS   pantoprazole  40 mg Oral Daily   [START ON 08/05/2021] predniSONE  20 mg Oral Q breakfast   senna-docusate  1 tablet Oral QHS   sodium chloride flush  3 mL Intravenous Q12H    Infusions:   prismasol BGK 4/2.5 400 mL/hr at 08/04/21 0902    prismasol BGK 4/2.5 200 mL/hr at 08/04/21 0901   heparin 10,000 units/ 20 mL infusion syringe 750 Units/hr (08/04/21 0006)   prismasol BGK 4/2.5 1,500 mL/hr at 08/04/21 0815    PRN Medications: camphor-menthol, heparin, heparin, HYDROmorphone, polyethylene glycol    Assessment/Plan   1. Acute on chronic diastolic CHF: With prominent RV dysfunction.  Echo from 11/22 reviewed, EF 60-65%, normal RV systolic function with mild dilation, D-shaped septum consistent with RV pressure/volume overload. Patient is markedly volume overloaded with anasarca.  Unfortunately, patient developed worsening AKI with attempted diuresis. RHC  w/ markedly elevated left and right filling pressure, mod pulmonary venous HTN and preserved CO. PAPi low at 0.82. Minimal response to maximal diuretics and milrinone 0.125, CVVH started.  She is now off milrinone with co-ox 71%.  CVVH ongoing, UF pulling net negative 300 cc/hr, weight coming down.  She remains very volume overloaded with CVP 27.  SBP upper 90s- low 100s.  She is not making urine.  - Continue CVVH pulling 300 cc/hr. Keep net negative today.   - Continue midodrine 10 mg tid.  2. AKI on CKD stage IIIb: Baseline creatinine 1.9-2.3, bumped to 3.68 in the setting of severe volume overload.  Suspect cardiorenal syndrome. RHC w/ normal CO but low PAPI suggesting RV dysfunction.  Now on CVVH.  Anuric.  - Nephrology following. Continue CVVH as above.  - I suspect she is going to need long term HD.  3. Cirrhosis: Uncertain etiology, labs negative for viral hepatitis.  Not a heavy drinker.  May be due to NAFLD or RV failure.  4. OSA: Continue CPAP at night.  5. Hyponatremia: 130  today. C/w CVVH.  6. Morbid obesity - Body mass index is 50.69 kg/m. 7. Left ankle pain: Patient has history of gout. Symptoms improved with prednisone course for gout.     Length of Stay: [address] PA-C  08/04/2021, 9:45 AM  Advanced Heart Failure Team Pager [phone] (M-F; 7a - 5p)  Please contact [facility] for night-coverage after hours (5p -7a ) and weekends on [email or web address]  Patient seen with PA, agree with the above note.   CVP still upper 20s, co-ox 71%.  Pulling CVVH 200-300 cc/hr net UF.  SBP generally 90s-100s on midodrine.    General: NAD Neck: JVP 16+, no thyromegaly or thyroid nodule.  Lungs: Clear  to auscultation bilaterally with normal respiratory effort. CV: Nondisplaced PMI.  Heart regular S1/S2, no S3/S4, no murmur.  2+ edema to thighs.  Abdomen: Soft, nontender, no hepatosplenomegaly, no distention.  Skin: Intact without lesions or rashes.  Neurologic: Alert and oriented x 3.  Psych: Normal affect. Extremities: No clubbing or cyanosis.  HEENT: Normal.   CVP still very high, now anuric. Needs more fluid off, do not think she is ready to transition to iHD yet.  BP stable on midodrine, continue to pull net negative 200-300 cc/hr as BP tolerates.   CRITICAL CARE Performed by: Loralie Champagne  Total critical care time: 35 minutes  Critical care time was exclusive of separately billable procedures and treating other patients.  Critical care was necessary to treat or prevent imminent or life-threatening deterioration.  Critical care was time spent personally by me on the following activities: development of treatment plan with patient and/or surrogate as well as nursing, discussions with consultants, evaluation of patient's response to treatment, examination of patient, obtaining history from patient or surrogate, ordering and performing treatments and interventions, ordering and review of laboratory studies, ordering and review of radiographic  studies, pulse oximetry and re-evaluation of patient's condition.  Loralie Champagne 08/04/2021 10:28 AM

## 2021-08-05 DIAGNOSIS — N179 Acute kidney failure, unspecified: Secondary | ICD-10-CM | POA: Diagnosis not present

## 2021-08-05 LAB — APTT: aPTT: 61 seconds — ABNORMAL HIGH (ref 24–36)

## 2021-08-05 LAB — RENAL FUNCTION PANEL
Albumin: 3.4 g/dL — ABNORMAL LOW (ref 3.5–5.0)
Albumin: 3.6 g/dL (ref 3.5–5.0)
Anion gap: 7 (ref 5–15)
Anion gap: 10 (ref 5–15)
BUN: 32 mg/dL — ABNORMAL HIGH (ref 6–20)
BUN: 32 mg/dL — ABNORMAL HIGH (ref 6–20)
CO2: 26 mmol/L (ref 22–32)
CO2: 25 mmol/L (ref 22–32)
Calcium: 9 mg/dL (ref 8.9–10.3)
Calcium: 9 mg/dL (ref 8.9–10.3)
Chloride: 99 mmol/L (ref 98–111)
Chloride: 98 mmol/L (ref 98–111)
Creatinine, Ser: 1.86 mg/dL — ABNORMAL HIGH (ref 0.44–1.00)
Creatinine, Ser: 1.74 mg/dL — ABNORMAL HIGH (ref 0.44–1.00)
GFR, Estimated: 33 mL/min — ABNORMAL LOW (ref >60)
GFR, Estimated: 36 mL/min — ABNORMAL LOW (ref >60)
Glucose, Bld: 88 mg/dL (ref 70–99)
Glucose, Bld: 118 mg/dL — ABNORMAL HIGH (ref 70–99)
Phosphorus: 2.8 mg/dL (ref 2.5–4.6)
Phosphorus: 2.5 mg/dL (ref 2.5–4.6)
Potassium: 4.9 mmol/L (ref 3.5–5.1)
Potassium: 5 mmol/L (ref 3.5–5.1)
Sodium: 132 mmol/L — ABNORMAL LOW (ref 135–145)
Sodium: 133 mmol/L — ABNORMAL LOW (ref 135–145)

## 2021-08-05 LAB — CBC
HCT: 31.2 % — ABNORMAL LOW (ref 36.0–46.0)
Hemoglobin: 10 g/dL — ABNORMAL LOW (ref 12.0–15.0)
MCH: 31.5 pg (ref 26.0–34.0)
MCHC: 32.1 g/dL (ref 30.0–36.0)
MCV: 98.4 fL (ref 80.0–100.0)
Platelets: 210 10*3/uL (ref 150–400)
RBC: 3.17 MIL/uL — ABNORMAL LOW (ref 3.87–5.11)
RDW: 16.3 % — ABNORMAL HIGH (ref 11.5–15.5)
WBC: 9.7 10*3/uL (ref 4.0–10.5)
nRBC: 0 % (ref 0.0–0.2)

## 2021-08-05 LAB — COOXEMETRY PANEL
Carboxyhemoglobin: 1.3 % (ref 0.5–1.5)
Methemoglobin: 0.9 % (ref 0.0–1.5)
O2 Saturation: 70.8 %
Total hemoglobin: 10.3 g/dL — ABNORMAL LOW (ref 12.0–16.0)

## 2021-08-05 LAB — MAGNESIUM: Magnesium: 2.6 mg/dL — ABNORMAL HIGH (ref 1.7–2.4)

## 2021-08-05 NOTE — Progress Notes (Signed)
[facility] NEPHROLOGY PROGRESS NOTE  Assessment/ Plan:  # Acute kidney Injury on chronic kidney disease stage IIIb (baseline creatinine 1.9-2.3): Appears hemodynamically mediated in the setting of acute exacerbation of diastolic heart failure with worsening from ongoing diuresis.  Started on CRRT after refractory to diuretic therapy and currently tolerating net -200-300 cc/h; weight has improved but CVP remains high therefore plan to continue CRRT mainly for ultrafiltration.  All 4K bath and on fixed dose heparin.  She remains anuric without evidence of renal recovery therefore she will likely need dialysis.  Once volume is optimized with CRRT we will attempt IHD.  We will likely continue CRRT through the weekend.  Discussed with the ICU team and with the patient.  # Hyponatremia: Secondary to CHF exacerbation/impaired free water deficit and inappropriate ADH activation-improving on CRRT.  #  Acute exacerbation of diastolic heart failure: Refractory to diuretics and inotropes.  VQ scan negative for PE and right heart catheterization shows markedly elevated left and right heart filling pressures with moderate pulmonary venous hypertension and preserved cardiac output.  Echo with EF of 60 to 65%.  On CRRT for volume management.    #  Anemia: Hemoglobin and hematocrit slightly lower-unclear if this is dilutional from CHF exacerbation. Monitor hemoglobin.  # Hypokalemia: Secondary to diuretic induced losses, replaced/corrected via oral route.  Now managing with CRRT.  #Liver cirrhosis: Hepatitis viral infection negative.  May be due to right heart failure.  #Hypotension: Milrinone stopped by cardiology and increased midodrine to 10 mg 3 times daily on 11/29.  Blood pressure acceptable and tolerating ultrafiltration.  Subjective: Seen and examined in ICU.  Denies nausea, vomiting, chest pain or shortness of breath.  The CVP is around 26.  No urine output.  Discussed with the  nurse.  Objective Vital signs in last 24 hours: Vitals:   08/05/21 0700 08/05/21 0745 08/05/21 0800 08/05/21 0803  BP: 95/80   104/81  Pulse: 78     Resp: 16  (!) 34 16  Temp:  97.6 F (36.4 C)    TempSrc:  Oral    SpO2: 100%     Weight:   (S) 116.8 kg   Height:       Weight change:   Intake/Output Summary (Last 24 hours) at 08/05/2021 0838 Last data filed at 08/05/2021 0800 Gross per 24 hour  Intake 1000 ml  Output 8321 ml  Net -7321 ml        Labs: Basic Metabolic Panel: Recent Labs  Lab 08/04/21 0453 08/04/21 1538 08/05/21 0352  NA 130* 131* 132*  K 4.7 5.2* 4.9  CL 96* 97* 99  CO2 26 23 26   GLUCOSE 87 111* 88  BUN 30* 32* 32*  CREATININE 1.94* 1.85* 1.86*  CALCIUM 9.0 8.9 9.0  PHOS 3.2 3.1 2.8    Liver Function Tests: Recent Labs  Lab 08/04/21 0453 08/04/21 1538 08/05/21 0352  ALBUMIN 3.3* 3.6 3.4*    No results for input(s): LIPASE, AMYLASE in the last 168 hours. No results for input(s): AMMONIA in the last 168 hours. CBC: Recent Labs  Lab 08/01/21 0516 08/02/21 0211 08/03/21 0314 08/04/21 0453 08/05/21 0352  WBC 13.2* 15.4* 13.8* 11.8* 9.7  HGB 10.2* 10.1* 9.9* 9.8* 10.0*  HCT 31.5* 30.2* 29.8* 30.6* 31.2*  MCV 97.2 98.1 98.3 98.7 98.4  PLT 234 226 213 220 210    Cardiac Enzymes: No results for input(s): CKTOTAL, CKMB, CKMBINDEX, TROPONINI in the last 168 hours. CBG: Recent Labs  Lab 08/02/21  1642  GLUCAP 92     Iron Studies: No results for input(s): IRON, TIBC, TRANSFERRIN, FERRITIN in the last 72 hours. Studies/Results: No results found.  Medications: Infusions:   prismasol BGK 4/2.5 400 mL/hr at 08/04/21 2305    prismasol BGK 4/2.5 200 mL/hr at 08/05/21 0222   heparin 10,000 units/ 20 mL infusion syringe 1,000 Units/hr (08/05/21 0800)   prismasol BGK 4/2.5 1,500 mL/hr at 08/05/21 0219    Scheduled Medications:  vitamin C  500 mg Oral Daily   Chlorhexidine Gluconate Cloth  6 each Topical Daily   cholecalciferol   1,000 Units Oral Daily   feeding supplement  237 mL Oral BID BM   heparin injection (subcutaneous)  5,000 Units Subcutaneous Q8H   midodrine  10 mg Oral TID WC   multivitamin  1 tablet Oral QHS   pantoprazole  40 mg Oral Daily   predniSONE  20 mg Oral Q breakfast   senna-docusate  1 tablet Oral QHS   sodium chloride flush  3 mL Intravenous Q12H    have reviewed scheduled and prn medications.  Physical Exam: General: Sitting on chair comfortable, not in distress Heart:RRR, s1s2 nl Lungs: Clear bilateral anteriorly. Abdomen:soft, Non-tender, non-distended Extremities:LE pitting edema +, boot, not much improvement of edema. Dialysis Access: Temporary HD catheter  Lauren Osborn 08/05/2021,8:38 AM  LOS: 14 days

## 2021-08-05 NOTE — TOC Initial Note (Signed)
Transition of Care [facility]) - Initial/Assessment Note    Patient Details  Name: Lauren Osborn MRN: [id number] Date of Birth: 04-10-1976  Transition of Care [facility]) CM/SW Contact:    Erenest Rasher, RN Phone Number: [phone] 08/05/2021, 2:07 PM  Clinical Narrative:                 HF TOC CM spoke to pt at bedside. Offered choice for [facility]. Pt agreeable to agency that accept her Medicaid plan. She states she will need shower chair for home. She plan to borrow a RW for her mother. Contacted [place], [place] with new referral for HHPT/RN. Will need HF HH RN/PT orders with F2F.  Pt states she was independent prior to hospital stay and drives herself to appt. States her dtr will be able to assist her at home as needed. Will have meds come up from [place] at dc.   Expected Discharge Plan: [place] Barriers to Discharge: Continued Medical Work up   Patient Goals and CMS Choice Patient states their goals for this hospitalization and ongoing recovery are:: wants to keep getting better CMS Medicare.gov Compare Post Acute Care list provided to:: Patient Choice offered to / list presented to : Patient  Expected Discharge Plan and Services Expected Discharge Plan: [place] In-house Referral: Clinical Social Work Discharge Planning Services: CM Consult Post Acute Care Choice: [place] arrangements for the past 2 months: Single Family Home                                      Prior Living Arrangements/Services Living arrangements for the past 2 months: Single Family Home Lives with:: Adult Children Patient language and need for interpreter reviewed:: Yes Do you feel safe going back to the place where you live?: Yes      Need for Family Participation in Patient Care: No (Comment) Care giver support system in place?: Yes (comment) Current home services: DME (rolling walker, CPAP) Criminal Activity/Legal Involvement Pertinent  to Current Situation/Hospitalization: No - Comment as needed  Activities of Daily Living Home Assistive Devices/Equipment: None ADL Screening (condition at time of admission) Patient's cognitive ability adequate to safely complete daily activities?: Yes Is the patient deaf or have difficulty hearing?: No Does the patient have difficulty seeing, even when wearing glasses/contacts?: No Does the patient have difficulty concentrating, remembering, or making decisions?: No Patient able to express need for assistance with ADLs?: Yes Does the patient have difficulty dressing or bathing?: No Independently performs ADLs?: Yes (appropriate for developmental age) Does the patient have difficulty walking or climbing stairs?: No Weakness of Legs: None Weakness of Arms/Hands: None  Permission Sought/Granted Permission sought to share information with : Case Manager, Family Supports Permission granted to share information with : Yes, Verbal Permission Granted  Share Information with NAME: [place] granted to share info w AGENCY: Home Health, DME  Permission granted to share info w Relationship: daughter  Permission granted to share info w Contact Information: [phone]  Emotional Assessment Appearance:: Appears stated age Attitude/Demeanor/Rapport: Gracious, Engaged Affect (typically observed): Accepting Orientation: : Oriented to Place, Oriented to  Time, Oriented to Situation, Oriented to Self Alcohol / Substance Use: Not Applicable Psych Involvement: No (comment)  Admission diagnosis:  Anasarca [R60.1] Other ascites [R18.8] Acute exacerbation of CHF (congestive heart failure) (HCC) [I50.9] Left leg swelling [M79.89] Dyspnea,  unspecified type [R06.00] Patient Active Problem List   Diagnosis Date Noted   Abdominal pain 07/23/2021   Vitamin D deficiency 07/23/2021   Hepatic cirrhosis ([place]) 07/23/2021   Hepatic hemangioma 07/23/2021   Right upper quadrant abdominal pain  07/22/2021   Rectal bleeding 07/22/2021   Shortness of breath 07/22/2021   Acute exacerbation of CHF (congestive heart failure) ([place]) 07/22/2021   CKD (chronic kidney disease), stage IV ([place]) 06/23/2021   Liver lesion 12/01/2020   Liver disease 12/01/2020   Elevated bilirubin 12/01/2020   OSA (obstructive sleep apnea) 11/16/2020   Acute on chronic heart failure with preserved ejection fraction (HFpEF) ([place]) 09/22/2020   Morbid obesity with BMI of 50.0-59.9, adult ([place]) 09/22/2020   Pulmonary edema 09/16/2020   Hypokalemia 09/16/2020   Prolonged QT interval 09/16/2020   Acute kidney injury ([place]) 09/16/2020   Elevated brain natriuretic peptide (BNP) level 09/16/2020   GERD (gastroesophageal reflux disease) 09/16/2020   Anasarca 09/16/2020   Super obese 09/16/2020   Normocytic anemia 04/23/2020   Family history of colon cancer 04/23/2020   Mass of upper outer quadrant of right breast 11/04/2019   S/P partial thyroidectomy 10/21/2018   Visit for routine gyn exam 07/03/2018   PCP:  [name], MD Pharmacy:   [facility], [address] [place] [address] Phone: [phone] Fax: [phone]     Social Determinants of Health (SDOH) Interventions Food Insecurity Interventions: Intervention Not Indicated Financial Strain Interventions: Intervention Not Indicated Housing Interventions: Intervention Not Indicated Transportation Interventions: Intervention Not Indicated  Readmission Risk Interventions Readmission Risk Prevention Plan 07/23/2021 06/24/2021  Transportation Screening Complete Complete  Home Care Screening Complete Complete  Medication Review (RN CM) Complete Complete  Some recent data might be hidden

## 2021-08-05 NOTE — Progress Notes (Signed)
Physical Therapy Treatment Patient Details Name: Lauren Osborn MRN: [id number] DOB: 05-May-1976 Today's Date: 08/05/2021   History of Present Illness 45 yo admitted 11/18 with CHF, AKI on CKD, cirrhosis. 11/22 Rt heart cath. 11/25 CRRT started. PMhx: HTn, CHF, OSA, gout, obesity    PT Comments    The pt continues to make great progress with OOB mobility, completing multiple sit-stand tranfers with no use of UE and reduced reliance on momentum. She also completed x2 bouts of 25ft ambulation with RW with all VSS, limited in progression by CRRT lines only. The pt remains eager to progress to d/c home and is making good progress with strength and activity tolerance to achieve these goals. Will continue with current plan of care.    Recommendations for follow up therapy are one component of a multi-disciplinary discharge planning process, led by the attending physician.  Recommendations may be updated based on patient status, additional functional criteria and insurance authorization.  Follow Up Recommendations  Home health PT     Assistance Recommended at Discharge Frequent or constant Supervision/Assistance  Equipment Recommendations  Wheelchair (measurements PT);BSC/3in1;Rolling walker (2 wheels);Wheelchair cushion (measurements PT)    Recommendations for Other Services       Precautions / Restrictions Precautions Precautions: Fall;Other (comment) Precaution Comments: Rt IJ CRRT, painful and edematous legs Restrictions Weight Bearing Restrictions: No     Mobility  Bed Mobility Overal bed mobility: Needs Assistance Bed Mobility: Supine to Sit     Supine to sit: Supervision;HOB elevated     General bed mobility comments: assist for CRRT management only    Transfers Overall transfer level: Needs assistance Equipment used: None;Rolling walker (2 wheels) Transfers: Sit to/from Stand Sit to Stand: Supervision     Step pivot transfers: Min assist     General transfer  comment: minA for CRRT management    Ambulation/Gait Ambulation/Gait assistance: Min guard Gait Distance (Feet): 36 Feet (x2) Assistive device: Rolling walker (2 wheels) Gait Pattern/deviations: Step-through pattern;Decreased stride length;Trunk flexed Gait velocity: decreased Gait velocity interpretation: <1.31 ft/sec, indicative of household ambulator   General Gait Details: pt with slow but steady gait with minimal clearance and dependence on BUE support. able to complete short bout of gait from [facility] to bed, then x2 forwards and backwards with +2 for line management/safety      Balance Overall balance assessment: Needs assistance Sitting-balance support: No upper extremity supported Sitting balance-Leahy Scale: Good     Standing balance support: No upper extremity supported Standing balance-Leahy Scale: Fair Standing balance comment: fair static standing with unsupported peri care, benefits from RW for dynamic tasks                            Cognition Arousal/Alertness: Awake/alert Behavior During Therapy: WFL for tasks assessed/performed Overall Cognitive Status: Within Functional Limits for tasks assessed                                          Exercises Other Exercises Other Exercises: 5xsit-stand in 32 sec, no use of hands from EOB    General Comments General comments (skin integrity, edema, etc.): VSS on RA      Pertinent Vitals/Pain Pain Assessment: Faces Faces Pain Scale: Hurts a little bit Pain Location: L LE Pain Descriptors / Indicators: Grimacing;Guarding Pain Intervention(s): Limited activity within patient's tolerance;Monitored during session;Repositioned  PT Goals (current goals can now be found in the care plan section) Acute Rehab PT Goals Patient Stated Goal: be able to return home and to work PT Goal Formulation: With patient Time For Goal Achievement: 08/16/21 Potential to Achieve Goals: Fair Progress towards  PT goals: Progressing toward goals    Frequency    Min 3X/week      PT Plan Current plan remains appropriate       AM-PAC PT "6 Clicks" Mobility   Outcome Measure  Help needed turning from your back to your side while in a flat bed without using bedrails?: A Little Help needed moving from lying on your back to sitting on the side of a flat bed without using bedrails?: A Lot Help needed moving to and from a bed to a chair (including a wheelchair)?: A Little Help needed standing up from a chair using your arms (e.g., wheelchair or bedside chair)?: A Little Help needed to walk in hospital room?: A Little Help needed climbing 3-5 steps with a railing? : A Lot 6 Click Score: 16    End of Session Equipment Utilized During Treatment: Gait belt Activity Tolerance: Patient tolerated treatment well Patient left: in bed;with call bell/phone within reach;with nursing/sitter in room Nurse Communication: Mobility status PT Visit Diagnosis: Other abnormalities of gait and mobility (R26.89);Difficulty in walking, not elsewhere classified (R26.2);Muscle weakness (generalized) (M62.81)     Time: 8115-7262 PT Time Calculation (min) (ACUTE ONLY): 35 min  Charges:  $Gait Training: 8-22 mins $Therapeutic Exercise: 8-22 mins                     [place] Car[place], PT, DPT   Acute Rehabilitation Department Pager #: [phone]   Sandra Cockayne 08/05/2021, 6:03 PM

## 2021-08-05 NOTE — Progress Notes (Signed)
Orthopedic Tech Progress Note Patient Details:  SHANDORA KOOGLER 01/03/1976 [id number]  Ortho Devices Type of Ortho Device: Haematologist Ortho Device/Splint Location: BLE Ortho Device/Splint Interventions: Ordered, Application   Post Interventions Patient Tolerated: Well Instructions Provided: Adjustment of device  [place] A Domnique Vanegas 08/05/2021, 3:59 PM

## 2021-08-05 NOTE — Progress Notes (Addendum)
Patient ID: Lauren Osborn, female   DOB: 1975-12-12, 45 y.o.   MRN: [id number]     Advanced Heart Failure Rounding Note  PCP-Cardiologist: Rozann Lesches, MD   Subjective:    [place] 11/22 w/ markedly elevated left and right filling pressure, mod pulmonary venous HTN and preserved CO.  She did not respond to maximal diuretics + milrinone 0.125, CVVH started.   Co-ox 71% off milrinone  Made small amount of urine yesterday. 8.3L volume removed with CVVH  CVVH currently pulling 300/hr  Weight this am pending  CVP 27  SBP soft 90s-low 100s  Less dyspnea, overall feeling much better. Able to ambulate yesterday. Gout pain improving.     [place] 07/26/21  Right Heart Pressures RHC Procedural Findings: Hemodynamics (mmHg) RA mean 35 RV 57/28, 35 PA 58/29, mean 46 PCWP mean 42  Oxygen saturations: PA 70% AO 99%  Cardiac Output (Fick) 7.04  Cardiac Index (Fick) 3.01 PVR < 1 WU PAPI < 1    Objective:   Weight Range: (S) 124.5 kg Body mass index is 48.62 kg/m.   Vital Signs:   Temp:  [97.1 F (36.2 C)-98.3 F (36.8 C)] 97.7 F (36.5 C) (12/02 0400) Pulse Rate:  [66-125] 78 (12/02 0700) Resp:  [12-28] 16 (12/02 0700) BP: (81-115)/(58-93) 95/80 (12/02 0700) SpO2:  [92 %-100 %] 100 % (12/02 0700) Weight:  [124.5 kg] 124.5 kg (12/01 1000) Last BM Date: 08/04/21  Weight change: Filed Weights   08/02/21 0700 08/03/21 0600 08/04/21 1000  Weight: (S) 132.6 kg 129.8 kg (S) 124.5 kg    Intake/Output:   Intake/Output Summary (Last 24 hours) at 08/05/2021 0736 Last data filed at 08/05/2021 0700 Gross per 24 hour  Intake 1120 ml  Output 8290 ml  Net -7170 ml      Physical Exam   CVP 27 General:  Comfortable lying in bed HEENT: normal Neck: supple. JVP to ear. R IJ CVC Cor: PMI nondisplaced. Regular rate & rhythm. No rubs, gallops or murmurs. Lungs: clear Abdomen: soft, nontender, + distended. No hepatosplenomegaly.  Extremities: no cyanosis, clubbing, rash, 2+  edema, UNNA on Neuro: alert & orientedx3, cranial nerves grossly intact. moves all 4 extremities w/o difficulty. Affect pleasant   Telemetry   NSR 70s (personally reviewed)   Labs    CBC Recent Labs    08/04/21 0453 08/05/21 0352  WBC 11.8* 9.7  HGB 9.8* 10.0*  HCT 30.6* 31.2*  MCV 98.7 98.4  PLT 220 009   Basic Metabolic Panel Recent Labs    08/04/21 0453 08/04/21 1538 08/05/21 0352  NA 130* 131* 132*  K 4.7 5.2* 4.9  CL 96* 97* 99  CO2 26 23 26   GLUCOSE 87 111* 88  BUN 30* 32* 32*  CREATININE 1.94* 1.85* 1.86*  CALCIUM 9.0 8.9 9.0  MG 2.5*  --  2.6*  PHOS 3.2 3.1 2.8   Liver Function Tests Recent Labs    08/04/21 1538 08/05/21 0352  ALBUMIN 3.6 3.4*   No results for input(s): LIPASE, AMYLASE in the last 72 hours. Cardiac Enzymes No results for input(s): CKTOTAL, CKMB, CKMBINDEX, TROPONINI in the last 72 hours.  BNP: BNP (last 3 results) Recent Labs    03/04/21 1222 06/23/21 1807 07/22/21 1755  BNP 543.0* 357.0* 510.0*    ProBNP (last 3 results) No results for input(s): PROBNP in the last 8760 hours.   D-Dimer No results for input(s): DDIMER in the last 72 hours. Hemoglobin A1C No results for input(s): HGBA1C in the last  72 hours. Fasting Lipid Panel No results for input(s): CHOL, HDL, LDLCALC, TRIG, CHOLHDL, LDLDIRECT in the last 72 hours. Thyroid Function Tests No results for input(s): TSH, T4TOTAL, T3FREE, THYROIDAB in the last 72 hours.  Invalid input(s): FREET3  Other results:   Imaging    No results found.   Medications:     Scheduled Medications:  vitamin C  500 mg Oral Daily   Chlorhexidine Gluconate Cloth  6 each Topical Daily   cholecalciferol  1,000 Units Oral Daily   feeding supplement  237 mL Oral BID BM   heparin injection (subcutaneous)  5,000 Units Subcutaneous Q8H   midodrine  10 mg Oral TID WC   multivitamin  1 tablet Oral QHS   pantoprazole  40 mg Oral Daily   predniSONE  20 mg Oral Q breakfast    senna-docusate  1 tablet Oral QHS   sodium chloride flush  3 mL Intravenous Q12H    Infusions:   prismasol BGK 4/2.5 400 mL/hr at 08/04/21 2305    prismasol BGK 4/2.5 200 mL/hr at 08/05/21 0222   heparin 10,000 units/ 20 mL infusion syringe 1,000 Units/hr (08/04/21 2311)   prismasol BGK 4/2.5 1,500 mL/hr at 08/05/21 0219    PRN Medications: camphor-menthol, heparin, heparin, HYDROmorphone, polyethylene glycol    Assessment/Plan   1. Acute on chronic diastolic CHF: With prominent RV dysfunction.  Echo from 11/22 reviewed, EF 60-65%, normal RV systolic function with mild dilation, D-shaped septum consistent with RV pressure/volume overload. Patient is markedly volume overloaded with anasarca.  Unfortunately, patient developed worsening AKI with attempted diuresis. RHC  w/ markedly elevated left and right filling pressure, mod pulmonary venous HTN and preserved CO. PAPi low at 0.82. Minimal response to maximal diuretics and milrinone 0.125, CVVH started.  Co-ox 71% off milrinone. CVVH ongoing, UF pulling net negative 300 cc/hr. Pulled 8.3L yesterday  CVP still elevated at 27.   - Continue CVVH pulling 300 cc/hr. Keep net negative today.   - Continue midodrine 10 mg tid. SBP 90s-low 100s 2. AKI on CKD stage IIIb: Baseline creatinine 1.9-2.3, bumped to 3.68 in the setting of severe volume overload.  Scr 1.86 today. Suspect cardiorenal syndrome. RHC w/ normal CO but low PAPI suggesting RV dysfunction.  Now on CVVH.  - Nephrology following. Continue CVVH as above.  - Suspect she is going to need long term HD.  - Has been anuric. Did make small amount of urine yesterday. Monitor 3. Cirrhosis: Uncertain etiology, labs negative for viral hepatitis.  Not a heavy drinker.  May be due to NAFLD or RV failure.  4. OSA: Continue CPAP at night.  5. Hyponatremia: 132 today. Improving with volume removal. C/w CVVH.  6. Morbid obesity - Body mass index is 48.62 kg/m. 7. Left ankle pain: Patient has  history of gout. Symptoms improved with prednisone burst for gout  Length of Stay: [place], [place], PA-C  08/05/2021, 7:36 AM  Advanced Heart Failure Team Pager [phone] (M-F; 7a - 5p)  Please contact [facility] for night-coverage after hours (5p -7a ) and weekends on [email or web address]  Patient seen with PA, agree with the above note.   She says she made "a little" urine overnight.  Breathing getting better.  Pulling UF 300 cc/hr by CVVH.  MAP stable on midodrine. CVP still >25.   General: NAD Neck: JVP 16 cm, no thyromegaly or thyroid nodule.  Lungs: Clear to auscultation bilaterally with normal respiratory effort. CV: Nondisplaced PMI.  Heart regular S1/S2, no  S3/S4, no murmur. 2+ edema to thighs.  Abdomen: Soft, nontender, no hepatosplenomegaly, no distention.  Skin: Intact without lesions or rashes.  Neurologic: Alert and oriented x 3.  Psych: Normal affect. Extremities: No clubbing or cyanosis.  HEENT: Normal.   She is now down about 60 lbs but CVP remains > 25.  She needs ongoing CVVH, continue at net 300 cc/hr UF, she is tolerating this well.  Remains on midodrine.   She is likely going to require long-term HD.   She is on [place] heparin DVT prophylaxis.   CRITICAL CARE Performed by: Loralie Champagne  Total critical care time: 35 minutes  Critical care time was exclusive of separately billable procedures and treating other patients.  Critical care was necessary to treat or prevent imminent or life-threatening deterioration.  Critical care was time spent personally by me on the following activities: development of treatment plan with patient and/or surrogate as well as nursing, discussions with consultants, evaluation of patient's response to treatment, examination of patient, obtaining history from patient or surrogate, ordering and performing treatments and interventions, ordering and review of laboratory studies, ordering and review of radiographic studies, pulse oximetry  and re-evaluation of patient's condition.  Loralie Champagne 08/05/2021 8:18 AM

## 2021-08-05 NOTE — Progress Notes (Addendum)
Occupational Therapy Treatment Patient Details Name: Lauren Osborn MRN: [id number] DOB: 06-29-1976 Today's Date: 08/05/2021   History of present illness 45 yo admitted 11/18 with CHF, AKI on CKD, cirrhosis. 11/22 Rt heart cath. 11/25 CRRT started. PMhx: HTn, CHF, OSA, gout, obesity   OT comments  Pt progressing well towards OT goals. Guided pt in bathing/dressing tasks with overall Supervision needed at most. Pt actively engaged in discussion for compensatory strategies and use of AE for bathing, peri care after toileting at home. Plan to educate on use of AE for LB dressing tasks in next session to maximize independence. Simulated tub transfers with some difficulty lifting L LE over tub height - anticipate improvements with decreased swelling. Educated re: energy conservation for ADLs/IADLs (handout provided) with plans to further reinforce. Based on progress, updating DC recs to [place]. Pt reports access to RW at home, perhaps BSC (TBD).  BP post activity: 102/79   Recommendations for follow up therapy are one component of a multi-disciplinary discharge planning process, led by the attending physician.  Recommendations may be updated based on patient status, additional functional criteria and insurance authorization.    Follow Up Recommendations  Home health OT    Assistance Recommended at Discharge Intermittent Supervision/Assistance  Equipment Recommendations  BSC/3in1    Recommendations for Other Services      Precautions / Restrictions Precautions Precautions: Fall;Other (comment) Precaution Comments: Rt IJ CRRT, painful and edematous legs Restrictions Weight Bearing Restrictions: No       Mobility Bed Mobility               General bed mobility comments: up in chair    Transfers Overall transfer level: Needs assistance Equipment used: None;Rolling walker (2 wheels) Transfers: Sit to/from Stand Sit to Stand: Supervision           General transfer comment: to  stand with and without RW     Balance Overall balance assessment: Needs assistance   Sitting balance-Leahy Scale: Good     Standing balance support: No upper extremity supported Standing balance-Leahy Scale: Fair Standing balance comment: fair static standing with unsupported peri care, benefits from RW for dynamic tasks                           ADL either performed or assessed with clinical judgement   ADL Overall ADL's : Needs assistance/impaired     Grooming: Set up;Sitting;Wash/dry face   Upper Body Bathing: Set up;Sitting Upper Body Bathing Details (indicate cue type and reason): reports use of long handled sponge at home for back Lower Body Bathing: Supervison/ safety;Sit to/from stand Lower Body Bathing Details (indicate cue type and reason): able to manage peri care in standing without DME, reach lower legs (reports use of long handled sponge as needed for feet at home) Upper Body Dressing : Set up;Sitting Upper Body Dressing Details (indicate cue type and reason): good assist with line mgmt                   General ADL Comments: Guided pt in bathing task with implementation of energy conservation strategies, discussion of AE for bathing, use of bidet attachment for thorough peri care at home, shower chair set up and use at home. Energy conservation with IADLs with handout provided. Simulated stepping over tub height with difficulty lifting L LE. Educated on use of gait belt as leg lifter (can use for tub transfers or bed mobility)    Extremity/Trunk  Assessment Upper Extremity Assessment Upper Extremity Assessment: Overall WFL for tasks assessed   Lower Extremity Assessment Lower Extremity Assessment: Defer to PT evaluation        Vision   Vision Assessment?: No apparent visual deficits   Perception     Praxis      Cognition Arousal/Alertness: Awake/alert Behavior During Therapy: WFL for tasks assessed/performed Overall Cognitive Status:  Within Functional Limits for tasks assessed                                            Exercises     Shoulder Instructions       General Comments VSS on RA    Pertinent Vitals/ Pain       Pain Assessment: Faces Faces Pain Scale: Hurts a little bit Pain Location: L LE Pain Descriptors / Indicators: Grimacing;Guarding Pain Intervention(s): Monitored during session  Home Living                                          Prior Functioning/Environment              Frequency  Min 2X/week        Progress Toward Goals  OT Goals(current goals can now be found in the care plan section)  Progress towards OT goals: Progressing toward goals  Acute Rehab OT Goals Patient Stated Goal: go home OT Goal Formulation: With patient Time For Goal Achievement: 08/17/21 Potential to Achieve Goals: Good ADL Goals Pt Will Perform Grooming: with modified independence;standing Pt Will Perform Lower Body Bathing: with modified independence;sitting/lateral leans;sit to/from stand;with adaptive equipment Pt Will Perform Lower Body Dressing: with modified independence;sitting/lateral leans;sit to/from stand;with adaptive equipment Pt Will Perform Toileting - Clothing Manipulation and hygiene: with modified independence;with adaptive equipment;sitting/lateral leans;sit to/from stand Additional ADL Goal #1: Pt to verbalize at least 3 energy conservation strategies to implement during ADLs/IADLs  Plan Discharge plan needs to be updated    Co-evaluation                 AM-PAC OT "6 Clicks" Daily Activity     Outcome Measure   Help from another person eating meals?: None Help from another person taking care of personal grooming?: A Little Help from another person toileting, which includes using toliet, bedpan, or urinal?: A Little Help from another person bathing (including washing, rinsing, drying)?: A Little Help from another person to put on and  taking off regular upper body clothing?: A Little Help from another person to put on and taking off regular lower body clothing?: A Lot 6 Click Score: 18    End of Session Equipment Utilized During Treatment: Rolling walker (2 wheels)  OT Visit Diagnosis: Unsteadiness on feet (R26.81);Other abnormalities of gait and mobility (R26.89);Muscle weakness (generalized) (M62.81)   Activity Tolerance Patient tolerated treatment well   Patient Left in chair;with call bell/phone within reach   Nurse Communication Mobility status        Time: 7035-0093 OT Time Calculation (min): 42 min  Charges: OT General Charges $OT Visit: 1 Visit OT Treatments $Self Care/Home Management : 23-37 mins $Therapeutic Activity: 8-22 mins  Malachy Chamber, OTR/L Acute Rehab Services Office: [phone]   Layla Maw 08/05/2021, 10:32 AM

## 2021-08-06 DIAGNOSIS — I5033 Acute on chronic diastolic (congestive) heart failure: Secondary | ICD-10-CM | POA: Diagnosis not present

## 2021-08-06 LAB — RENAL FUNCTION PANEL
Albumin: 3.6 g/dL (ref 3.5–5.0)
Albumin: 3.8 g/dL (ref 3.5–5.0)
Anion gap: 9 (ref 5–15)
Anion gap: 8 (ref 5–15)
BUN: 30 mg/dL — ABNORMAL HIGH (ref 6–20)
BUN: 31 mg/dL — ABNORMAL HIGH (ref 6–20)
CO2: 27 mmol/L (ref 22–32)
CO2: 25 mmol/L (ref 22–32)
Calcium: 9.1 mg/dL (ref 8.9–10.3)
Calcium: 9.3 mg/dL (ref 8.9–10.3)
Chloride: 97 mmol/L — ABNORMAL LOW (ref 98–111)
Chloride: 99 mmol/L (ref 98–111)
Creatinine, Ser: 1.73 mg/dL — ABNORMAL HIGH (ref 0.44–1.00)
Creatinine, Ser: 1.8 mg/dL — ABNORMAL HIGH (ref 0.44–1.00)
GFR, Estimated: 36 mL/min — ABNORMAL LOW (ref >60)
GFR, Estimated: 35 mL/min — ABNORMAL LOW (ref >60)
Glucose, Bld: 83 mg/dL (ref 70–99)
Glucose, Bld: 106 mg/dL — ABNORMAL HIGH (ref 70–99)
Phosphorus: 2.4 mg/dL — ABNORMAL LOW (ref 2.5–4.6)
Phosphorus: 2.5 mg/dL (ref 2.5–4.6)
Potassium: 4.5 mmol/L (ref 3.5–5.1)
Potassium: 5 mmol/L (ref 3.5–5.1)
Sodium: 133 mmol/L — ABNORMAL LOW (ref 135–145)
Sodium: 132 mmol/L — ABNORMAL LOW (ref 135–145)

## 2021-08-06 LAB — CBC
HCT: 32.2 % — ABNORMAL LOW (ref 36.0–46.0)
Hemoglobin: 10.3 g/dL — ABNORMAL LOW (ref 12.0–15.0)
MCH: 31.4 pg (ref 26.0–34.0)
MCHC: 32 g/dL (ref 30.0–36.0)
MCV: 98.2 fL (ref 80.0–100.0)
Platelets: 185 10*3/uL (ref 150–400)
RBC: 3.28 MIL/uL — ABNORMAL LOW (ref 3.87–5.11)
RDW: 16.3 % — ABNORMAL HIGH (ref 11.5–15.5)
WBC: 8.8 10*3/uL (ref 4.0–10.5)
nRBC: 0 % (ref 0.0–0.2)

## 2021-08-06 LAB — COOXEMETRY PANEL
Carboxyhemoglobin: 1.3 % (ref 0.5–1.5)
Methemoglobin: 0.8 % (ref 0.0–1.5)
O2 Saturation: 65.9 %
Total hemoglobin: 10.4 g/dL — ABNORMAL LOW (ref 12.0–16.0)

## 2021-08-06 LAB — MAGNESIUM: Magnesium: 2.6 mg/dL — ABNORMAL HIGH (ref 1.7–2.4)

## 2021-08-06 LAB — APTT: aPTT: 62 seconds — ABNORMAL HIGH (ref 24–36)

## 2021-08-06 NOTE — Progress Notes (Signed)
[facility] NEPHROLOGY PROGRESS NOTE  Assessment/ Plan:  # Acute kidney Injury on chronic kidney disease stage IIIb (baseline creatinine 1.9-2.3): Appears hemodynamically mediated in the setting of acute exacerbation of diastolic heart failure with worsening from ongoing diuresis.  Started on CRRT after refractory to diuretic therapy and currently tolerating net -200-300 cc/h; weight has improved but CVP remains high therefore plan to continue CRRT mainly for ultrafiltration.  All 4K bath and on fixed dose heparin.  She remains anuric without evidence of renal recovery therefore she will likely need dialysis.  Once volume is optimized with CRRT we will attempt IHD.  We will likely continue CRRT through the weekend.  Discussed with the ICU team and with the patient. No change today.  # Hyponatremia: Secondary to CHF exacerbation/impaired free water deficit and inappropriate ADH activation-improving on CRRT.  #  Acute exacerbation of diastolic heart failure: Refractory to diuretics and inotropes.  VQ scan negative for PE and right heart catheterization shows markedly elevated left and right heart filling pressures with moderate pulmonary venous hypertension and preserved cardiac output.  Echo with EF of 60 to 65%.  On CRRT for volume management.    #  Anemia: Hemoglobin and hematocrit slightly lower-unclear if this is dilutional from CHF exacerbation. Monitor hemoglobin.  # Hypokalemia: Secondary to diuretic induced losses, replaced/corrected via oral route.  Now managing with CRRT.  #Liver cirrhosis: Hepatitis viral infection negative.  May be due to right heart failure.  #Hypotension: Milrinone stopped by cardiology and increased midodrine to 10 mg 3 times daily on 11/29.  Blood pressure acceptable and tolerating ultrafiltration.  Subjective: Seen and examined in ICU.  No new event, tolerating CRRT well.  No urine output.  Denies nausea, vomiting, chest pain, shortness of breath.   CVP around 25.  Objective Vital signs in last 24 hours: Vitals:   08/06/21 0300 08/06/21 0400 08/06/21 0500 08/06/21 0600  BP: 103/79 98/67 93/64  96/70  Pulse: 65 84 69 64  Resp: 20 20 16 15   Temp:      TempSrc:      SpO2: 97% 99% 99% 100%  Weight:      Height:       Weight change: -7.7 kg  Intake/Output Summary (Last 24 hours) at 08/06/2021 1638 Last data filed at 08/06/2021 0700 Gross per 24 hour  Intake 1260 ml  Output 8708 ml  Net -7448 ml        Labs: Basic Metabolic Panel: Recent Labs  Lab 08/05/21 0352 08/05/21 1558 08/06/21 0343  NA 132* 133* 133*  K 4.9 5.0 4.5  CL 99 98 97*  CO2 26 25 27   GLUCOSE 88 118* 83  BUN 32* 32* 30*  CREATININE 1.86* 1.74* 1.73*  CALCIUM 9.0 9.0 9.1  PHOS 2.8 2.5 2.4*    Liver Function Tests: Recent Labs  Lab 08/05/21 0352 08/05/21 1558 08/06/21 0343  ALBUMIN 3.4* 3.6 3.6    No results for input(s): LIPASE, AMYLASE in the last 168 hours. No results for input(s): AMMONIA in the last 168 hours. CBC: Recent Labs  Lab 08/02/21 0211 08/03/21 0314 08/04/21 0453 08/05/21 0352 08/06/21 0343  WBC 15.4* 13.8* 11.8* 9.7 8.8  HGB 10.1* 9.9* 9.8* 10.0* 10.3*  HCT 30.2* 29.8* 30.6* 31.2* 32.2*  MCV 98.1 98.3 98.7 98.4 98.2  PLT 226 213 220 210 185    Cardiac Enzymes: No results for input(s): CKTOTAL, CKMB, CKMBINDEX, TROPONINI in the last 168 hours. CBG: Recent Labs  Lab 08/02/21 [place]  92     Iron Studies: No results for input(s): IRON, TIBC, TRANSFERRIN, FERRITIN in the last 72 hours. Studies/Results: No results found.  Medications: Infusions:   prismasol BGK 4/2.5 400 mL/hr at 08/05/21 2124    prismasol BGK 4/2.5 200 mL/hr at 08/05/21 1113   heparin 10,000 units/ 20 mL infusion syringe 1,000 Units/hr (08/06/21 0600)   prismasol BGK 4/2.5 1,500 mL/hr at 08/06/21 0532    Scheduled Medications:  vitamin C  500 mg Oral Daily   Chlorhexidine Gluconate Cloth  6 each Topical Daily   cholecalciferol   1,000 Units Oral Daily   feeding supplement  237 mL Oral BID BM   heparin injection (subcutaneous)  5,000 Units Subcutaneous Q8H   midodrine  10 mg Oral TID WC   multivitamin  1 tablet Oral QHS   pantoprazole  40 mg Oral Daily   predniSONE  20 mg Oral Q breakfast   senna-docusate  1 tablet Oral QHS   sodium chloride flush  3 mL Intravenous Q12H    have reviewed scheduled and prn medications.  Physical Exam: General: Not in distress, eating breakfast Heart:RRR, s1s2 nl Lungs: Clear bilateral anteriorly. Abdomen:soft, Non-tender, non-distended Extremities:LE pitting edema +, boot, not much improvement of edema. Dialysis Access: Temporary HD catheter  Lauren Osborn Lauren Osborn 08/06/2021,7:22 AM  LOS: 15 days

## 2021-08-06 NOTE — Progress Notes (Signed)
Patient ID: Lauren Osborn, female   DOB: 05-Oct-1975, 45 y.o.   MRN: [id number]     Advanced Heart Failure Rounding Note  PCP-Cardiologist: Rozann Lesches, MD   Subjective:    [name] 11/22 w/ markedly elevated left and right filling pressure, mod pulmonary venous HTN and preserved CO.  She did not respond to maximal diuretics + milrinone 0.125, CVVH started.   Off milrinone. CO-ox pending   Remains on CVVH currently pulling 370/hr. CVP still 25,   Breathing better. Orthopnea resolved. No CP or PND. C/o L UNNA boot being too tight  Labs pending   RHC 07/26/21  Right Heart Pressures RHC Procedural Findings: Hemodynamics (mmHg) RA mean 35 RV 57/28, 35 PA 58/29, mean 46 PCWP mean 42  Oxygen saturations: PA 70% AO 99%  Cardiac Output (Fick) 7.04  Cardiac Index (Fick) 3.01 PVR < 1 WU PAPI < 1    Objective:   Weight Range: (S) 116.8 kg Body mass index is 45.61 kg/m.   Vital Signs:   Temp:  [97.5 F (36.4 C)-97.8 F (36.6 C)] 97.5 F (36.4 C) (12/02 2300) Pulse Rate:  [64-86] 65 (12/03 0300) Resp:  [15-34] 20 (12/03 0300) BP: (93-110)/(63-83) 103/79 (12/03 0300) SpO2:  [97 %-100 %] 97 % (12/03 0300) Weight:  [116.8 kg] 116.8 kg (12/02 0800) Last BM Date: 08/05/21  Weight change: Filed Weights   08/03/21 0600 08/04/21 1000 08/05/21 0800  Weight: 129.8 kg (S) 124.5 kg (S) 116.8 kg    Intake/Output:   Intake/Output Summary (Last 24 hours) at 08/06/2021 0400 Last data filed at 08/06/2021 0300 Gross per 24 hour  Intake 1540 ml  Output 8548 ml  Net -7008 ml       Physical Exam   CVP 24-25 General:  Lying in bed . No resp difficulty HEENT: normal Neck: supple. R IJ HD cath. Carotids 2+ bilat; no bruits. No lymphadenopathy or thryomegaly appreciated. Cor: PMI nondisplaced. Regular rate & rhythm. No rubs, gallops or murmurs. Lungs: clear Abdomen: obese  soft, nontender, nondistended. No hepatosplenomegaly. No bruits or masses. Good bowel  sounds. Extremities: no cyanosis, clubbing, rash, 2-3+ edema + UNNA Neuro: alert & orientedx3, cranial nerves grossly intact. moves all 4 extremities w/o difficulty. Affect pleasant   Telemetry   NSR 60-70s (personally reviewed)   Labs    CBC Recent Labs    08/04/21 0453 08/05/21 0352  WBC 11.8* 9.7  HGB 9.8* 10.0*  HCT 30.6* 31.2*  MCV 98.7 98.4  PLT 220 426    Basic Metabolic Panel Recent Labs    08/04/21 0453 08/04/21 1538 08/05/21 0352 08/05/21 1558  NA 130*   < > 132* 133*  K 4.7   < > 4.9 5.0  CL 96*   < > 99 98  CO2 26   < > 26 25  GLUCOSE 87   < > 88 118*  BUN 30*   < > 32* 32*  CREATININE 1.94*   < > 1.86* 1.74*  CALCIUM 9.0   < > 9.0 9.0  MG 2.5*  --  2.6*  --   PHOS 3.2   < > 2.8 2.5   < > = values in this interval not displayed.    Liver Function Tests Recent Labs    08/05/21 0352 08/05/21 1558  ALBUMIN 3.4* 3.6    No results for input(s): LIPASE, AMYLASE in the last 72 hours. Cardiac Enzymes No results for input(s): CKTOTAL, CKMB, CKMBINDEX, TROPONINI in the last 72 hours.  BNP: BNP (  last 3 results) Recent Labs    03/04/21 1222 06/23/21 1807 07/22/21 1755  BNP 543.0* 357.0* 510.0*     ProBNP (last 3 results) No results for input(s): PROBNP in the last 8760 hours.   D-Dimer No results for input(s): DDIMER in the last 72 hours. Hemoglobin A1C No results for input(s): HGBA1C in the last 72 hours. Fasting Lipid Panel No results for input(s): CHOL, HDL, LDLCALC, TRIG, CHOLHDL, LDLDIRECT in the last 72 hours. Thyroid Function Tests No results for input(s): TSH, T4TOTAL, T3FREE, THYROIDAB in the last 72 hours.  Invalid input(s): FREET3  Other results:   Imaging    No results found.   Medications:     Scheduled Medications:  vitamin C  500 mg Oral Daily   Chlorhexidine Gluconate Cloth  6 each Topical Daily   cholecalciferol  1,000 Units Oral Daily   feeding supplement  237 mL Oral BID BM   heparin injection  (subcutaneous)  5,000 Units Subcutaneous Q8H   midodrine  10 mg Oral TID WC   multivitamin  1 tablet Oral QHS   pantoprazole  40 mg Oral Daily   predniSONE  20 mg Oral Q breakfast   senna-docusate  1 tablet Oral QHS   sodium chloride flush  3 mL Intravenous Q12H    Infusions:   prismasol BGK 4/2.5 400 mL/hr at 08/05/21 2124    prismasol BGK 4/2.5 200 mL/hr at 08/05/21 1113   heparin 10,000 units/ 20 mL infusion syringe 1,000 Units/hr (08/06/21 0300)   prismasol BGK 4/2.5 1,500 mL/hr at 08/05/21 2309    PRN Medications: camphor-menthol, heparin, heparin, HYDROmorphone, polyethylene glycol    Assessment/Plan   1. Acute on chronic diastolic CHF: With prominent RV dysfunction.  Echo from 11/22 reviewed, EF 60-65%, normal RV systolic function with mild dilation, D-shaped septum consistent with RV pressure/volume overload. Patient is markedly volume overloaded with anasarca.  Unfortunately, patient developed worsening AKI with attempted diuresis. RHC  w/ markedly elevated left and right filling pressure, mod pulmonary venous HTN and preserved CO. PAPi low at 0.82. Minimal response to maximal diuretics and milrinone 0.125, CVVH started.  Co-ox has been stable off milrinone. CVVH ongoing, UF pulling net negative 370 cc/hr.   CVP still elevated at 24-35.   - Continue CVVH pulling 300-400 cc/hr.  - Continue midodrine 10 mg tid. SBP 90s-low 100s 2. AKI on CKD stage IIIb: Baseline creatinine 1.9-2.3, bumped to 3.68 in the setting of severe volume overload.  Scr 1.86 today. Suspect cardiorenal syndrome. RHC w/ normal CO but low PAPI suggesting RV dysfunction.  Now on CVVH.  - Nephrology following. Continue CVVH as above.  - Suspect she is going to need long term HD. No change - Has been essentially anuric. Only 5-10 cc urine overnight 3. Cirrhosis: Uncertain etiology, labs negative for viral hepatitis.  Not a heavy drinker.  May be due to NAFLD or RV failure.  4. OSA: Continue CPAP at night.  5.  Hyponatremia: 133 yesterday await am labs. Improving with volume removal. C/w CVVH.  6. Morbid obesity - Body mass index is 45.61 kg/m. 7. Left ankle pain: Patient has history of gout. Symptoms improved with prednisone burst for gout. Now says UNNA boot is tight. Can cut lower portion as needed. Foot is well perfused.   CRITICAL CARE Performed by: Glori Bickers  Total critical care time: 35 minutes  Critical care time was exclusive of separately billable procedures and treating other patients.  Critical care was necessary to treat or prevent  imminent or life-threatening deterioration.  Critical care was time spent personally by me (independent of midlevel providers or residents) on the following activities: development of treatment plan with patient and/or surrogate as well as nursing, discussions with consultants, evaluation of patient's response to treatment, examination of patient, obtaining history from patient or surrogate, ordering and performing treatments and interventions, ordering and review of laboratory studies, ordering and review of radiographic studies, pulse oximetry and re-evaluation of patient's condition.   Length of Stay: [place], MD  08/06/2021, 4:00 AM  Advanced Heart Failure Team Pager [phone] (M-F; 7a - 5p)  Please contact [facility] for night-coverage after hours (5p -7a ) and weekends on [email or web address]

## 2021-08-07 DIAGNOSIS — N179 Acute kidney failure, unspecified: Secondary | ICD-10-CM | POA: Diagnosis not present

## 2021-08-07 DIAGNOSIS — I5033 Acute on chronic diastolic (congestive) heart failure: Secondary | ICD-10-CM | POA: Diagnosis not present

## 2021-08-07 LAB — RENAL FUNCTION PANEL
Albumin: 3.7 g/dL (ref 3.5–5.0)
Albumin: 3.8 g/dL (ref 3.5–5.0)
Anion gap: 6 (ref 5–15)
Anion gap: 9 (ref 5–15)
BUN: 31 mg/dL — ABNORMAL HIGH (ref 6–20)
BUN: 31 mg/dL — ABNORMAL HIGH (ref 6–20)
CO2: 27 mmol/L (ref 22–32)
CO2: 26 mmol/L (ref 22–32)
Calcium: 9.2 mg/dL (ref 8.9–10.3)
Calcium: 9.2 mg/dL (ref 8.9–10.3)
Chloride: 98 mmol/L (ref 98–111)
Chloride: 98 mmol/L (ref 98–111)
Creatinine, Ser: 1.89 mg/dL — ABNORMAL HIGH (ref 0.44–1.00)
Creatinine, Ser: 1.78 mg/dL — ABNORMAL HIGH (ref 0.44–1.00)
GFR, Estimated: 33 mL/min — ABNORMAL LOW (ref >60)
GFR, Estimated: 35 mL/min — ABNORMAL LOW (ref >60)
Glucose, Bld: 85 mg/dL (ref 70–99)
Glucose, Bld: 103 mg/dL — ABNORMAL HIGH (ref 70–99)
Phosphorus: 2.7 mg/dL (ref 2.5–4.6)
Phosphorus: 2.5 mg/dL (ref 2.5–4.6)
Potassium: 4.8 mmol/L (ref 3.5–5.1)
Potassium: 4.9 mmol/L (ref 3.5–5.1)
Sodium: 131 mmol/L — ABNORMAL LOW (ref 135–145)
Sodium: 133 mmol/L — ABNORMAL LOW (ref 135–145)

## 2021-08-07 LAB — CBC
HCT: 33.6 % — ABNORMAL LOW (ref 36.0–46.0)
Hemoglobin: 10.7 g/dL — ABNORMAL LOW (ref 12.0–15.0)
MCH: 31.7 pg (ref 26.0–34.0)
MCHC: 31.8 g/dL (ref 30.0–36.0)
MCV: 99.4 fL (ref 80.0–100.0)
Platelets: 174 10*3/uL (ref 150–400)
RBC: 3.38 MIL/uL — ABNORMAL LOW (ref 3.87–5.11)
RDW: 16.1 % — ABNORMAL HIGH (ref 11.5–15.5)
WBC: 10.1 10*3/uL (ref 4.0–10.5)
nRBC: 0 % (ref 0.0–0.2)

## 2021-08-07 LAB — COOXEMETRY PANEL
Carboxyhemoglobin: 1.4 % (ref 0.5–1.5)
Carboxyhemoglobin: 1.2 % (ref 0.5–1.5)
Methemoglobin: 1 % (ref 0.0–1.5)
Methemoglobin: 0.9 % (ref 0.0–1.5)
O2 Saturation: 48.7 %
O2 Saturation: 56.9 %
Total hemoglobin: 9.7 g/dL — ABNORMAL LOW (ref 12.0–16.0)
Total hemoglobin: 11.1 g/dL — ABNORMAL LOW (ref 12.0–16.0)

## 2021-08-07 LAB — APTT: aPTT: 80 seconds — ABNORMAL HIGH (ref 24–36)

## 2021-08-07 LAB — MAGNESIUM: Magnesium: 2.6 mg/dL — ABNORMAL HIGH (ref 1.7–2.4)

## 2021-08-07 MED ORDER — MIDODRINE HCL 5 MG PO TABS
15.0000 mg | ORAL_TABLET | Freq: Three times a day (TID) | ORAL | Status: DC
  Administered 2021-08-07 – 2021-08-20 (×38): 15 mg via ORAL
  Filled 2021-08-07 (×38): qty 3

## 2021-08-07 MED ORDER — MIDODRINE HCL 5 MG PO TABS
5.0000 mg | ORAL_TABLET | Freq: Once | ORAL | Status: AC
  Administered 2021-08-07: 13:00:00 5 mg via ORAL
  Filled 2021-08-07: qty 1

## 2021-08-07 NOTE — Progress Notes (Signed)
[facility] NEPHROLOGY PROGRESS NOTE  Assessment/ Plan:  # Acute kidney Injury on chronic kidney disease stage IIIb (baseline creatinine 1.9-2.3): Appears hemodynamically mediated in the setting of acute exacerbation of diastolic heart failure with worsening from ongoing diuresis.  Started on CRRT after refractory to diuretic therapy and currently tolerating net -200-300 cc/h; weight has improved but CVP (25) remains high therefore plan to continue CRRT mainly for ultrafiltration.  All 4K bath and on fixed dose heparin.  She remains anuric without evidence of renal recovery therefore she will likely need dialysis.  Once volume is optimized with CRRT we will attempt IHD.  We will continue CRRT today and assess for IHD in the morning.  Discussed with the ICU team and with the patient.  # Hyponatremia: Secondary to CHF exacerbation/impaired free water deficit and inappropriate ADH activation-improving on CRRT.  #  Acute exacerbation of diastolic heart failure: Refractory to diuretics and inotropes.  VQ scan negative for PE and right heart catheterization shows markedly elevated left and right heart filling pressures with moderate pulmonary venous hypertension and preserved cardiac output.  Echo with EF of 60 to 65%.  On CRRT for volume management.    #  Anemia: Hemoglobin and hematocrit stable. Monitor hemoglobin.  # Hypokalemia: Secondary to diuretic induced losses, replaced/corrected via oral route.  Now managing with CRRT.  #Liver cirrhosis: Hepatitis viral infection negative.  May be due to right heart failure.  #Hypotension: Milrinone stopped by cardiology and increased midodrine to 10 mg 3 times daily on 11/29.  Blood pressure acceptable and tolerating ultrafiltration.  Subjective: Seen and examined in ICU.  No new event today.  Denies nausea, vomiting, chest pain, shortness of breath.  CVP is around 25.  No urine output. Objective Vital signs in last 24 hours: Vitals:    08/07/21 0500 08/07/21 0600 08/07/21 0700 08/07/21 0818  BP: 94/79 97/69 97/82    Pulse: 61 (!) 57    Resp: (!) 23 11 (!) 22   Temp:    (!) 97.4 F (36.3 C)  TempSrc:    Oral  SpO2: 97% 100%    Weight:      Height:       Weight change: -7.4 kg  Intake/Output Summary (Last 24 hours) at 08/07/2021 0908 Last data filed at 08/07/2021 0800 Gross per 24 hour  Intake 1198 ml  Output 8642 ml  Net -7444 ml        Labs: Basic Metabolic Panel: Recent Labs  Lab 08/06/21 0343 08/06/21 1553 08/07/21 0400  NA 133* 132* 131*  K 4.5 5.0 4.8  CL 97* 99 98  CO2 27 25 27   GLUCOSE 83 106* 85  BUN 30* 31* 31*  CREATININE 1.73* 1.80* 1.89*  CALCIUM 9.1 9.3 9.2  PHOS 2.4* 2.5 2.7    Liver Function Tests: Recent Labs  Lab 08/06/21 0343 08/06/21 1553 08/07/21 0400  ALBUMIN 3.6 3.8 3.7    No results for input(s): LIPASE, AMYLASE in the last 168 hours. No results for input(s): AMMONIA in the last 168 hours. CBC: Recent Labs  Lab 08/03/21 0314 08/04/21 0453 08/05/21 0352 08/06/21 0343 08/07/21 0400  WBC 13.8* 11.8* 9.7 8.8 10.1  HGB 9.9* 9.8* 10.0* 10.3* 10.7*  HCT 29.8* 30.6* 31.2* 32.2* 33.6*  MCV 98.3 98.7 98.4 98.2 99.4  PLT 213 220 210 185 174    Cardiac Enzymes: No results for input(s): CKTOTAL, CKMB, CKMBINDEX, TROPONINI in the last 168 hours. CBG: Recent Labs  Lab 08/02/21 1642  GLUCAP 92  Iron Studies: No results for input(s): IRON, TIBC, TRANSFERRIN, FERRITIN in the last 72 hours. Studies/Results: No results found.  Medications: Infusions:   prismasol BGK 4/2.5 400 mL/hr at 08/06/21 2240    prismasol BGK 4/2.5 200 mL/hr at 08/06/21 1409   heparin 10,000 units/ 20 mL infusion syringe 1,000 Units/hr (08/07/21 0800)   prismasol BGK 4/2.5 1,500 mL/hr at 08/07/21 6294    Scheduled Medications:  vitamin C  500 mg Oral Daily   Chlorhexidine Gluconate Cloth  6 each Topical Daily   cholecalciferol  1,000 Units Oral Daily   feeding supplement  237 mL  Oral BID BM   heparin injection (subcutaneous)  5,000 Units Subcutaneous Q8H   midodrine  10 mg Oral TID WC   multivitamin  1 tablet Oral QHS   pantoprazole  40 mg Oral Daily   senna-docusate  1 tablet Oral QHS   sodium chloride flush  3 mL Intravenous Q12H    have reviewed scheduled and prn medications.  Physical Exam: General: Pleasant female, not in distress Heart:RRR, s1s2 nl Lungs: Clear bilateral anteriorly. Abdomen:soft, Non-tender, non-distended Extremities:LE pitting edema +, boot, edema is about the same without much improvement. Dialysis Access: Temporary HD catheter  Mandi Mattioli Tanna Furry 08/07/2021,9:08 AM  LOS: 16 days

## 2021-08-07 NOTE — Progress Notes (Addendum)
Patient ID: Lauren Osborn, female   DOB: September 21, 1975, 45 y.o.   MRN: [id number]     Advanced Heart Failure Rounding Note  PCP-Cardiologist: Lauren Lesches, MD   Subjective:    [name] 11/22 w/ markedly elevated left and right filling pressure, mod pulmonary venous HTN and preserved CO.  She did not respond to maximal diuretics + milrinone 0.125, CVVH started.   Off milrinone. Co-ox 57%  CVP 25 -> 19-21  Remains on CVVH currently pulling > 300/hr. Weight down another 16 pounds. Weight down 75 pounds.   Denies SOB, orthopnea or PND.   [place] 07/26/21  Right Heart Pressures RHC Procedural Findings: Hemodynamics (mmHg) RA mean 35 RV 57/28, 35 PA 58/29, mean 46 PCWP mean 42  Oxygen saturations: PA 70% AO 99%  Cardiac Output (Fick) 7.04  Cardiac Index (Fick) 3.01 PVR < 1 WU PAPI < 1    Objective:   Weight Range: (S) 109.4 kg Body mass index is 42.72 kg/m.   Vital Signs:   Temp:  [97.4 F (36.3 C)-97.9 F (36.6 C)] 97.4 F (36.3 C) (12/04 0818) Pulse Rate:  [57-85] 68 (12/04 0921) Resp:  [11-38] 17 (12/04 0921) BP: (86-109)/(66-84) 101/78 (12/04 0921) SpO2:  [97 %-100 %] 98 % (12/04 0921) Weight:  [109.4 kg] 109.4 kg (12/03 1400) Last BM Date: 08/06/21  Weight change: Filed Weights   08/04/21 1000 08/05/21 0800 08/06/21 1400  Weight: (S) 124.5 kg (S) 116.8 kg (S) 109.4 kg    Intake/Output:   Intake/Output Summary (Last 24 hours) at 08/07/2021 1105 Last data filed at 08/07/2021 0800 Gross per 24 hour  Intake 1198 ml  Output 7813 ml  Net -6615 ml       Physical Exam   CVP 19-21 General:  Sitting in chair HEENT: normal Neck: supple. RIJ HD cath. CVP up Cor: PMI nondisplaced. Regular rate & rhythm. No rubs, gallops or murmurs. Lungs: clear Abdomen: obese soft, nontender, + distended. No hepatosplenomegaly. No bruits or masses. Good bowel sounds. Extremities: no cyanosis, clubbing, rash,  +UNNA 2+ edema Neuro: alert & orientedx3, cranial nerves grossly  intact. moves all 4 extremities w/o difficulty. Affect pleasant    Telemetry   NSR 60-70s (personally reviewed)   Labs    CBC Recent Labs    08/06/21 0343 08/07/21 0400  WBC 8.8 10.1  HGB 10.3* 10.7*  HCT 32.2* 33.6*  MCV 98.2 99.4  PLT 185 409    Basic Metabolic Panel Recent Labs    08/06/21 0343 08/06/21 1553 08/07/21 0400  NA 133* 132* 131*  K 4.5 5.0 4.8  CL 97* 99 98  CO2 27 25 27   GLUCOSE 83 106* 85  BUN 30* 31* 31*  CREATININE 1.73* 1.80* 1.89*  CALCIUM 9.1 9.3 9.2  MG 2.6*  --  2.6*  PHOS 2.4* 2.5 2.7    Liver Function Tests Recent Labs    08/06/21 1553 08/07/21 0400  ALBUMIN 3.8 3.7    No results for input(s): LIPASE, AMYLASE in the last 72 hours. Cardiac Enzymes No results for input(s): CKTOTAL, CKMB, CKMBINDEX, TROPONINI in the last 72 hours.  BNP: BNP (last 3 results) Recent Labs    03/04/21 1222 06/23/21 1807 07/22/21 1755  BNP 543.0* 357.0* 510.0*     ProBNP (last 3 results) No results for input(s): PROBNP in the last 8760 hours.   D-Dimer No results for input(s): DDIMER in the last 72 hours. Hemoglobin A1C No results for input(s): HGBA1C in the last 72 hours. Fasting Lipid Panel  No results for input(s): CHOL, HDL, LDLCALC, TRIG, CHOLHDL, LDLDIRECT in the last 72 hours. Thyroid Function Tests No results for input(s): TSH, T4TOTAL, T3FREE, THYROIDAB in the last 72 hours.  Invalid input(s): FREET3  Other results:   Imaging    No results found.   Medications:     Scheduled Medications:  vitamin C  500 mg Oral Daily   Chlorhexidine Gluconate Cloth  6 each Topical Daily   cholecalciferol  1,000 Units Oral Daily   feeding supplement  237 mL Oral BID BM   heparin injection (subcutaneous)  5,000 Units Subcutaneous Q8H   midodrine  10 mg Oral TID WC   multivitamin  1 tablet Oral QHS   pantoprazole  40 mg Oral Daily   senna-docusate  1 tablet Oral QHS   sodium chloride flush  3 mL Intravenous Q12H     Infusions:   prismasol BGK 4/2.5 400 mL/hr at 08/06/21 2240    prismasol BGK 4/2.5 200 mL/hr at 08/06/21 1409   heparin 10,000 units/ 20 mL infusion syringe 1,000 Units/hr (08/07/21 0921)   prismasol BGK 4/2.5 1,500 mL/hr at 08/07/21 1006    PRN Medications: camphor-menthol, heparin, heparin, HYDROmorphone, polyethylene glycol    Assessment/Plan   1. Acute on chronic diastolic CHF: With prominent RV dysfunction.  Echo from 11/22 reviewed, EF 60-65%, normal RV systolic function with mild dilation, D-shaped septum consistent with RV pressure/volume overload. Patient is markedly volume overloaded with anasarca.  Unfortunately, patient developed worsening AKI with attempted diuresis. RHC  w/ markedly elevated left and right filling pressure, mod pulmonary venous HTN and preserved CO. PAPi low at 0.82. Minimal response to maximal diuretics and milrinone 0.125, CVVH started.  Co-ox has been stable off milrinone. 57% t CVVH ongoing, UF pulling net negative ~300 cc/hr.   CVP still elevated at 19-21  - Continue CVVH pulling ~300cc/hr.  - MAPs soft. Increase midodrine to 15 mg tid.  2. AKI on CKD stage IIIb: Baseline creatinine 1.9-2.3, bumped to 3.68 in the setting of severe volume overload.  Scr 1.86 today. Suspect cardiorenal syndrome. RHC w/ normal CO but low PAPI suggesting RV dysfunction.  Now on CVVH.  - Nephrology following. Continue CVVH as above. Would not attempt switch to IHD until euvolemic if at all possible - Suspect she is going to need long term HD. No change - Remains anuric 3. Cirrhosis: Uncertain etiology, labs negative for viral hepatitis.  Not a heavy drinker.  May be due to NAFLD or RV failure.  4. OSA: Continue CPAP at night.  5. Hyponatremia: Na 131 today. Managed with CVVHD 6. Morbid obesity - Body mass index is 42.72 kg/m. 7. Left ankle pain: Patient has history of gout. Symptoms improved with prednisone burst for gout.   CRITICAL CARE Performed by: Glori Bickers  Total critical care time: 35 minutes  Critical care time was exclusive of separately billable procedures and treating other patients.  Critical care was necessary to treat or prevent imminent or life-threatening deterioration.  Critical care was time spent personally by me (independent of midlevel providers or residents) on the following activities: development of treatment plan with patient and/or surrogate as well as nursing, discussions with consultants, evaluation of patient's response to treatment, examination of patient, obtaining history from patient or surrogate, ordering and performing treatments and interventions, ordering and review of laboratory studies, ordering and review of radiographic studies, pulse oximetry and re-evaluation of patient's condition.   Length of Stay: 53  Glori Bickers, MD  08/07/2021, 11:05  AM  Advanced Heart Failure Team Pager [phone] (M-F; 7a - 5p)  Please contact [facility] for night-coverage after hours (5p -7a ) and weekends on [email or web address]

## 2021-08-08 DIAGNOSIS — I5033 Acute on chronic diastolic (congestive) heart failure: Secondary | ICD-10-CM | POA: Diagnosis not present

## 2021-08-08 LAB — RENAL FUNCTION PANEL
Albumin: 3.6 g/dL (ref 3.5–5.0)
Albumin: 3.7 g/dL (ref 3.5–5.0)
Anion gap: 6 (ref 5–15)
Anion gap: 10 (ref 5–15)
BUN: 27 mg/dL — ABNORMAL HIGH (ref 6–20)
BUN: 25 mg/dL — ABNORMAL HIGH (ref 6–20)
CO2: 27 mmol/L (ref 22–32)
CO2: 25 mmol/L (ref 22–32)
Calcium: 9 mg/dL (ref 8.9–10.3)
Calcium: 8.4 mg/dL — ABNORMAL LOW (ref 8.9–10.3)
Chloride: 99 mmol/L (ref 98–111)
Chloride: 97 mmol/L — ABNORMAL LOW (ref 98–111)
Creatinine, Ser: 1.83 mg/dL — ABNORMAL HIGH (ref 0.44–1.00)
Creatinine, Ser: 1.82 mg/dL — ABNORMAL HIGH (ref 0.44–1.00)
GFR, Estimated: 34 mL/min — ABNORMAL LOW (ref >60)
GFR, Estimated: 34 mL/min — ABNORMAL LOW (ref >60)
Glucose, Bld: 83 mg/dL (ref 70–99)
Glucose, Bld: 88 mg/dL (ref 70–99)
Phosphorus: 2.4 mg/dL — ABNORMAL LOW (ref 2.5–4.6)
Phosphorus: 4.1 mg/dL (ref 2.5–4.6)
Potassium: 4.7 mmol/L (ref 3.5–5.1)
Potassium: 4.7 mmol/L (ref 3.5–5.1)
Sodium: 132 mmol/L — ABNORMAL LOW (ref 135–145)
Sodium: 132 mmol/L — ABNORMAL LOW (ref 135–145)

## 2021-08-08 LAB — CBC
HCT: 33.8 % — ABNORMAL LOW (ref 36.0–46.0)
Hemoglobin: 10.8 g/dL — ABNORMAL LOW (ref 12.0–15.0)
MCH: 31.8 pg (ref 26.0–34.0)
MCHC: 32 g/dL (ref 30.0–36.0)
MCV: 99.4 fL (ref 80.0–100.0)
Platelets: 203 10*3/uL (ref 150–400)
RBC: 3.4 MIL/uL — ABNORMAL LOW (ref 3.87–5.11)
RDW: 16.2 % — ABNORMAL HIGH (ref 11.5–15.5)
WBC: 9.7 10*3/uL (ref 4.0–10.5)
nRBC: 0 % (ref 0.0–0.2)

## 2021-08-08 LAB — MAGNESIUM: Magnesium: 2.6 mg/dL — ABNORMAL HIGH (ref 1.7–2.4)

## 2021-08-08 LAB — COOXEMETRY PANEL
Carboxyhemoglobin: 1.4 % (ref 0.5–1.5)
Methemoglobin: 0.9 % (ref 0.0–1.5)
O2 Saturation: 67 %
Total hemoglobin: 10.5 g/dL — ABNORMAL LOW (ref 12.0–16.0)

## 2021-08-08 LAB — MRSA NEXT GEN BY PCR, NASAL: MRSA by PCR Next Gen: NOT DETECTED

## 2021-08-08 LAB — FOLATE: Folate: 11.3 ng/mL (ref >5.9)

## 2021-08-08 LAB — APTT: aPTT: 84 seconds — ABNORMAL HIGH (ref 24–36)

## 2021-08-08 NOTE — Progress Notes (Signed)
Occupational Therapy Treatment Patient Details Name: Lauren Osborn MRN: [id number] DOB: October 05, 1975 Today's Date: 08/08/2021   History of present illness 45 yo admitted 11/18 with CHF, AKI on CKD, cirrhosis. 11/22 Rt heart cath. 11/25 CRRT started. PMhx: HTn, CHF, OSA, gout, obesity   OT comments  Pt making excellent progress towards OT goals. Session focused on AE education (including reacher, dressing stick, and sock aid) for LB ADLs with pt able to return demo well after initial education. Also educated on positional strategies for LB ADLs without use of AE with improving ability to reach B feet noted with decreased edema. Based on functional progress, updated DC recs to no OT follow-up but will continue to follow acutely.    Recommendations for follow up therapy are one component of a multi-disciplinary discharge planning process, led by the attending physician.  Recommendations may be updated based on patient status, additional functional criteria and insurance authorization.    Follow Up Recommendations  No OT follow up    Assistance Recommended at Discharge Intermittent Supervision/Assistance  Equipment Recommendations  BSC/3in1 (may have access to [facility] already; need to confirm)    Recommendations for Other Services      Precautions / Restrictions Precautions Precautions: Fall;Other (comment) Precaution Comments: Rt IJ CRRT, LE edema Restrictions Weight Bearing Restrictions: No       Mobility Bed Mobility Overal bed mobility: Needs Assistance Bed Mobility: Sit to Supine;Supine to Sit     Supine to sit: Supervision;HOB elevated Sit to supine: Supervision   General bed mobility comments: for line safety and CRRT line mgmt    Transfers                         Balance Overall balance assessment: Needs assistance Sitting-balance support: No upper extremity supported Sitting balance-Leahy Scale: Good                                     ADL  either performed or assessed with clinical judgement   ADL Overall ADL's : Needs assistance/impaired                     Lower Body Dressing: Modified independent;Sit to/from stand;With adaptive equipment Lower Body Dressing Details (indicate cue type and reason): educated on use of AE (reacher, sock aid, dressing stick) for LB dressing tasks with pt able to return demo after education. Educated on other strategies without use of AE for LB ADLs due to improving ability to reach               General ADL Comments: Session focused on AE education for LB ADL with improving ability to reach B feet due to decreasing edema    Extremity/Trunk Assessment Upper Extremity Assessment Upper Extremity Assessment: Overall WFL for tasks assessed   Lower Extremity Assessment Lower Extremity Assessment: Defer to PT evaluation        Vision   Vision Assessment?: No apparent visual deficits   Perception     Praxis      Cognition Arousal/Alertness: Awake/alert Behavior During Therapy: WFL for tasks assessed/performed Overall Cognitive Status: Within Functional Limits for tasks assessed  Exercises     Shoulder Instructions       General Comments      Pertinent Vitals/ Pain       Pain Assessment: Faces Faces Pain Scale: Hurts a little bit Pain Location: L LE Pain Descriptors / Indicators: Grimacing;Guarding Pain Intervention(s): Monitored during session  Home Living                                          Prior Functioning/Environment              Frequency  Min 2X/week        Progress Toward Goals  OT Goals(current goals can now be found in the care plan section)  Progress towards OT goals: Progressing toward goals  Acute Rehab OT Goals Patient Stated Goal: go home soon OT Goal Formulation: With patient Time For Goal Achievement: 08/17/21 Potential to Achieve Goals:  Good ADL Goals Pt Will Perform Grooming: with modified independence;standing Pt Will Perform Lower Body Bathing: with modified independence;sitting/lateral leans;sit to/from stand;with adaptive equipment Pt Will Perform Lower Body Dressing: with modified independence;sitting/lateral leans;sit to/from stand;with adaptive equipment Pt Will Perform Toileting - Clothing Manipulation and hygiene: with modified independence;with adaptive equipment;sitting/lateral leans;sit to/from stand Additional ADL Goal #1: Pt to verbalize at least 3 energy conservation strategies to implement during ADLs/IADLs  Plan Discharge plan needs to be updated    Co-evaluation                 AM-PAC OT "6 Clicks" Daily Activity     Outcome Measure   Help from another person eating meals?: None Help from another person taking care of personal grooming?: A Little Help from another person toileting, which includes using toliet, bedpan, or urinal?: A Little Help from another person bathing (including washing, rinsing, drying)?: A Little Help from another person to put on and taking off regular upper body clothing?: A Little Help from another person to put on and taking off regular lower body clothing?: A Little 6 Click Score: 19    End of Session    OT Visit Diagnosis: Unsteadiness on feet (R26.81);Other abnormalities of gait and mobility (R26.89);Muscle weakness (generalized) (M62.81)   Activity Tolerance Patient tolerated treatment well   Patient Left in bed;with call bell/phone within reach;with nursing/sitter in room   Nurse Communication Mobility status        Time: 1324-4010 OT Time Calculation (min): 19 min  Charges: OT General Charges $OT Visit: 1 Visit OT Treatments $Self Care/Home Management : 8-22 mins  Malachy Chamber, OTR/L Acute Rehab Services Office: [phone]   Layla Maw 08/08/2021, 3:06 PM

## 2021-08-08 NOTE — Progress Notes (Signed)
Orthopedic Tech Progress Note Patient Details:  Lauren Osborn 1976-07-28 [id number]  Ortho Devices Type of Ortho Device: Haematologist Ortho Device/Splint Location: BLE Ortho Device/Splint Interventions: Ordered, Application   Post Interventions Patient Tolerated: Well Instructions Provided: [facility] [place] 08/08/2021, 1:07 PM

## 2021-08-08 NOTE — Progress Notes (Signed)
[facility] NEPHROLOGY PROGRESS NOTE  Assessment/ Plan:  # Acute kidney Injury on chronic kidney disease stage IIIb (baseline creatinine 1.9-2.3): Appears hemodynamically mediated in the setting of acute exacerbation of diastolic heart failure with worsening from ongoing diuresis.  Started on CRRT after refractory to diuretic therapy and currently tolerating UF; weight has improved but CVP remains high therefore plan to continue CRRT mainly for ultrafiltration.  All 4K bath and on fixed dose heparin.  She remains anuric without evidence of renal recovery therefore she will likely need dialysis.  Once volume is optimized with CRRT we will attempt IHD.  Will continue CRRT today.  Discussed with the RN and patient  # Hyponatremia: Secondary to CHF exacerbation/impaired free water deficit and inappropriate ADH activation-stable on CRRT.  #  Acute exacerbation of diastolic heart failure: Refractory to diuretics and inotropes.  VQ scan negative for PE and right heart catheterization shows markedly elevated left and right heart filling pressures with moderate pulmonary venous hypertension and preserved cardiac output.  Echo with EF of 60 to 65%.  On CRRT for volume management.    #  Anemia: Hemoglobin and hematocrit stable. Monitor hemoglobin.  # Hypokalemia: Secondary to diuretic induced losses, replaced/corrected via oral route.  Now managing with CRRT.  #Liver cirrhosis: Hepatitis viral infection negative.  May be due to right heart failure.  #Hypotension: Milrinone stopped by cardiology and increased midodrine to 10 mg 3 times daily on 11/29.  Blood pressure acceptable and tolerating ultrafiltration.  Subjective: Seen and examined in ICU.  No acute events, cvp's remain in the 20's. Tolerating crrt Objective Vital signs in last 24 hours: Vitals:   08/08/21 0400 08/08/21 0500 08/08/21 0600 08/08/21 0700  BP: 106/78 94/73 (!) 83/66 100/79  Pulse: 66 64 62 74  Resp: 14 14 (!) 22 18   Temp: (!) 97.5 F (36.4 C)     TempSrc: Oral     SpO2: 96% 95% 96% 100%  Weight:      Height:       Weight change: -5.5 kg  Intake/Output Summary (Last 24 hours) at 08/08/2021 0739 Last data filed at 08/08/2021 0720 Gross per 24 hour  Intake 1422 ml  Output 6949 ml  Net -5527 ml       Labs: Basic Metabolic Panel: Recent Labs  Lab 08/07/21 0400 08/07/21 1600 08/08/21 0415  NA 131* 133* 132*  K 4.8 4.9 4.7  CL 98 98 99  CO2 27 26 27   GLUCOSE 85 103* 83  BUN 31* 31* 27*  CREATININE 1.89* 1.78* 1.83*  CALCIUM 9.2 9.2 9.0  PHOS 2.7 2.5 2.4*   Liver Function Tests: Recent Labs  Lab 08/07/21 0400 08/07/21 1600 08/08/21 0415  ALBUMIN 3.7 3.8 3.6   No results for input(s): LIPASE, AMYLASE in the last 168 hours. No results for input(s): AMMONIA in the last 168 hours. CBC: Recent Labs  Lab 08/04/21 0453 08/05/21 0352 08/06/21 0343 08/07/21 0400 08/08/21 0415  WBC 11.8* 9.7 8.8 10.1 9.7  HGB 9.8* 10.0* 10.3* 10.7* 10.8*  HCT 30.6* 31.2* 32.2* 33.6* 33.8*  MCV 98.7 98.4 98.2 99.4 99.4  PLT 220 210 185 174 203   Cardiac Enzymes: No results for input(s): CKTOTAL, CKMB, CKMBINDEX, TROPONINI in the last 168 hours. CBG: Recent Labs  Lab 08/02/21 1642  GLUCAP 92    Iron Studies: No results for input(s): IRON, TIBC, TRANSFERRIN, FERRITIN in the last 72 hours. Studies/Results: No results found.  Medications: Infusions:   prismasol BGK 4/2.5 400  mL/hr at 08/08/21 0610    prismasol BGK 4/2.5 200 mL/hr at 08/08/21 0603   heparin 10,000 units/ 20 mL infusion syringe 1,000 Units/hr (08/08/21 0602)   prismasol BGK 4/2.5 1,500 mL/hr at 08/08/21 8250    Scheduled Medications:  vitamin C  500 mg Oral Daily   Chlorhexidine Gluconate Cloth  6 each Topical Daily   cholecalciferol  1,000 Units Oral Daily   feeding supplement  237 mL Oral BID BM   heparin injection (subcutaneous)  5,000 Units Subcutaneous Q8H   midodrine  15 mg Oral TID WC   multivitamin  1  tablet Oral QHS   pantoprazole  40 mg Oral Daily   senna-docusate  1 tablet Oral QHS   sodium chloride flush  3 mL Intravenous Q12H    have reviewed scheduled and prn medications.  Physical Exam: General: Pleasant female, not in distress, laying flat in bed Heart:RRR, s1s2 nl Lungs: Clear bilateral anteriorly. Abdomen:soft, Non-tender, non-distended Extremities:LE pitting edema +, boot Dialysis Access: Temporary HD catheter-rij  Lauren Osborn 08/08/2021,7:39 AM  LOS: 17 days

## 2021-08-08 NOTE — Progress Notes (Signed)
Patient ID: Lauren Osborn, female   DOB: [birth year], 45 y.o.   MRN: [id number]     Advanced Heart Failure Rounding Note  PCP-Cardiologist: Rozann Lesches, MD   Subjective:    [place] 11/22 w/ markedly elevated left and right filling pressure, mod pulmonary venous HTN and preserved CO.  She did not respond to maximal diuretics + milrinone 0.125, CVVH started.   Off milrinone. Co-ox 77%, CVP 24-25.   Remains on CVVH currently pulling net negative UF about 300/hr. Weight down 12 lbs.   Denies SOB, orthopnea or PND. Walked around unit yesterday when CVVH unhooked.   [place] 07/26/21  Right Heart Pressures RHC Procedural Findings: Hemodynamics (mmHg) RA mean 35 RV 57/28, 35 PA 58/29, mean 46 PCWP mean 42  Oxygen saturations: PA 70% AO 99%  Cardiac Output (Fick) 7.04  Cardiac Index (Fick) 3.01 PVR < 1 WU PAPI < 1    Objective:   Weight Range: (S) 103.9 kg Body mass index is 40.58 kg/m.   Vital Signs:   Temp:  [97.4 F (36.3 C)-97.5 F (36.4 C)] 97.5 F (36.4 C) (12/05 0400) Pulse Rate:  [60-131] 74 (12/05 0700) Resp:  [14-30] 18 (12/05 0700) BP: (79-111)/(52-84) 100/79 (12/05 0700) SpO2:  [94 %-100 %] 100 % (12/05 0700) Weight:  [103.9 kg] 103.9 kg (12/04 1530) Last BM Date: 08/07/21  Weight change: Filed Weights   08/05/21 0800 08/06/21 1400 08/07/21 1530  Weight: (S) 116.8 kg (S) 109.4 kg (S) 103.9 kg    Intake/Output:   Intake/Output Summary (Last 24 hours) at 08/08/2021 0759 Last data filed at 08/08/2021 0720 Gross per 24 hour  Intake 1422 ml  Output 6949 ml  Net -5527 ml      Physical Exam   General: NAD Neck: JVP 16 cm, no thyromegaly or thyroid nodule.  Lungs: Clear to auscultation bilaterally with normal respiratory effort. CV: Nondisplaced PMI.  Heart regular S1/S2, no S3/S4, no murmur.  1+ edema to thighs. Abdomen: Soft, nontender, no hepatosplenomegaly, no distention.  Skin: Intact without lesions or rashes.  Neurologic: Alert and oriented x  3.  Psych: Normal affect. Extremities: No clubbing or cyanosis.  HEENT: Normal.    Telemetry   NSR 60-70s (personally reviewed)   Labs    CBC Recent Labs    08/07/21 0400 08/08/21 0415  WBC 10.1 9.7  HGB 10.7* 10.8*  HCT 33.6* 33.8*  MCV 99.4 99.4  PLT 174 786   Basic Metabolic Panel Recent Labs    08/07/21 0400 08/07/21 1600 08/08/21 0415  NA 131* 133* 132*  K 4.8 4.9 4.7  CL 98 98 99  CO2 27 26 27   GLUCOSE 85 103* 83  BUN 31* 31* 27*  CREATININE 1.89* 1.78* 1.83*  CALCIUM 9.2 9.2 9.0  MG 2.6*  --  2.6*  PHOS 2.7 2.5 2.4*   Liver Function Tests Recent Labs    08/07/21 1600 08/08/21 0415  ALBUMIN 3.8 3.6   No results for input(s): LIPASE, AMYLASE in the last 72 hours. Cardiac Enzymes No results for input(s): CKTOTAL, CKMB, CKMBINDEX, TROPONINI in the last 72 hours.  BNP: BNP (last 3 results) Recent Labs    03/04/21 1222 06/23/21 1807 07/22/21 1755  BNP 543.0* 357.0* 510.0*    ProBNP (last 3 results) No results for input(s): PROBNP in the last 8760 hours.   D-Dimer No results for input(s): DDIMER in the last 72 hours. Hemoglobin A1C No results for input(s): HGBA1C in the last 72 hours. Fasting Lipid Panel No results  for input(s): CHOL, HDL, LDLCALC, TRIG, CHOLHDL, LDLDIRECT in the last 72 hours. Thyroid Function Tests No results for input(s): TSH, T4TOTAL, T3FREE, THYROIDAB in the last 72 hours.  Invalid input(s): FREET3  Other results:   Imaging    No results found.   Medications:     Scheduled Medications:  vitamin C  500 mg Oral Daily   Chlorhexidine Gluconate Cloth  6 each Topical Daily   cholecalciferol  1,000 Units Oral Daily   feeding supplement  237 mL Oral BID BM   heparin injection (subcutaneous)  5,000 Units Subcutaneous Q8H   midodrine  15 mg Oral TID WC   multivitamin  1 tablet Oral QHS   pantoprazole  40 mg Oral Daily   senna-docusate  1 tablet Oral QHS   sodium chloride flush  3 mL Intravenous Q12H     Infusions:   prismasol BGK 4/2.5 400 mL/hr at 08/08/21 0610    prismasol BGK 4/2.5 200 mL/hr at 08/08/21 0603   heparin 10,000 units/ 20 mL infusion syringe 1,000 Units/hr (08/08/21 0602)   prismasol BGK 4/2.5 1,500 mL/hr at 08/08/21 0659    PRN Medications: camphor-menthol, heparin, heparin, HYDROmorphone, polyethylene glycol    Assessment/Plan   1. Acute on chronic diastolic CHF: With prominent RV dysfunction.  Echo from 11/22 reviewed, EF 60-65%, normal RV systolic function with mild dilation, D-shaped septum consistent with RV pressure/volume overload. Patient is markedly volume overloaded with anasarca.  Unfortunately, patient developed worsening AKI with attempted diuresis. RHC  w/ markedly elevated left and right filling pressure, mod pulmonary venous HTN and preserved CO. PAPi low at 0.82. Minimal response to maximal diuretics and milrinone 0.125, CVVH started.  Co-ox has been stable off milrinone, 67%. CVVH ongoing, UF pulling net negative ~300 cc/hr.   CVP still elevated at 24-25.  Weight markedly down. SBP stable 90s-100s.  - Continue CVVH pulling ~300cc/hr today.  - Continue midodrine 15 mg tid.  2. AKI on CKD stage IIIb: Baseline creatinine 1.9-2.3, bumped to 3.68 in the setting of severe volume overload.  Suspect cardiorenal syndrome. RHC w/ normal CO but low PAPI suggesting RV dysfunction.  Now on CVVH. She is anuric.  - Nephrology following. Continue CVVH as above. Would not attempt switch to IHD until euvolemic if at all possible - Suspect she is going to need long term HD. No change 3. Cirrhosis: Uncertain etiology, labs negative for viral hepatitis.  Not a heavy drinker.  May be due to NAFLD or RV failure.  4. OSA: Continue CPAP at night.  5. Hyponatremia: Managed with CVVHD 6. Morbid obesity - Body mass index is 40.58 kg/m. 7. Left ankle pain: Patient has history of gout. Symptoms improved with prednisone burst for gout, completed.   CRITICAL CARE Performed by:  Loralie Champagne  Total critical care time: 35 minutes  Critical care time was exclusive of separately billable procedures and treating other patients.  Critical care was necessary to treat or prevent imminent or life-threatening deterioration.  Critical care was time spent personally by me (independent of midlevel providers or residents) on the following activities: development of treatment plan with patient and/or surrogate as well as nursing, discussions with consultants, evaluation of patient's response to treatment, examination of patient, obtaining history from patient or surrogate, ordering and performing treatments and interventions, ordering and review of laboratory studies, ordering and review of radiographic studies, pulse oximetry and re-evaluation of patient's condition.   Length of Stay: 30  Loralie Champagne, MD  08/08/2021, 7:59 AM  Advanced  Heart Failure Team Pager [phone] (M-F; 7a - 5p)  Please contact [facility] for night-coverage after hours (5p -7a ) and weekends on [email or web address]

## 2021-08-08 NOTE — Progress Notes (Signed)
Nutrition Follow-up  DOCUMENTATION CODES:   Obesity unspecified  INTERVENTION:   Continue Ensure Enlive po BID, each supplement provides 350 kcal and 20 grams of protein  Given prolonged CRRT, pt at risk for vitamin/mineral deficiencies. Recommend checking folate, B6, C, copper and supplementing further if deficient   NUTRITION DIAGNOSIS:   Increased nutrient needs related to acute illness as evidenced by estimated needs.  Being addressed via supplements  GOAL:   Patient will meet greater than or equal to 90% of their needs  Progressing  MONITOR:   PO intake, Supplement acceptance, Labs, Weight trends  REASON FOR ASSESSMENT:   LOS, Rounds    ASSESSMENT:   45 yo female with acute on chronic CHF, AKI on CKD 3 requiring CRRT for significant volume removal, +cirrhosis/ PMH includes HTN, gout, GERD, CKD, liver hemangioma/liver disease, cholelithiasis, gastritis, esophagitis  11/25 CRRT initiated  Pt remains on CRRT since 11/25 (10 days); pt remains anuric without signs of renal recovery.   Appetite improved; pt eating 75-100% of meals. Oral nutrition supplement change to Ensure Enlive from [employer] per patient request. Pt likes Ensure and is drinking well  Constipation has resolved with +BM today  Weight down to 99.6 kg; admit weight 141 kg, Net negative 56 L.   Labs: sodium 132 (L), phosphorus 2.4 Meds: Vitamin C, Vitamin D3, Renal MVI   Diet Order:   Diet Order             Diet 2 gram sodium Room service appropriate? Yes; Fluid consistency: Thin; Fluid restriction: 1200 mL Fluid  Diet effective now                   EDUCATION NEEDS:   Not appropriate for education at this time  Skin:  Skin Assessment: Reviewed RN Assessment  Last BM:  12/5  Height:   Ht Readings from Last 1 Encounters:  07/23/21 5\' 3"  (1.6 m)    Weight:   Wt Readings from Last 1 Encounters:  08/08/21 99.6 kg    BMI:  Body mass index is 38.9 kg/m.  Estimated  Nutritional Needs:   Kcal:  2000-2200 kcals  Protein:  100-120 g  Fluid:  1200 mL fluid restriction per MD   Kerman Passey MS, RDN, LDN, CNSC Registered Dietitian III Clinical Nutrition RD Pager and On-Call Pager Number Located in [place]

## 2021-08-08 NOTE — Progress Notes (Signed)
Physical Therapy Treatment Patient Details Name: Lauren Osborn MRN: [id number] DOB: [birth year] Today's Date: 08/08/2021   History of Present Illness 45 yo admitted 11/18 with CHF, AKI on CKD, cirrhosis. 11/22 Rt heart cath. 11/25 CRRT started. PMhx: HTn, CHF, OSA, gout, obesity    PT Comments    Pt doing well with mobility. Limited by CRRT. Has amb in hallways when briefly off CRRT for filter change.    Recommendations for follow up therapy are one component of a multi-disciplinary discharge planning process, led by the attending physician.  Recommendations may be updated based on patient status, additional functional criteria and insurance authorization.  Follow Up Recommendations  Home health PT     Assistance Recommended at Discharge Intermittent Supervision/Assistance  Equipment Recommendations  Rollator (4 wheels)    Recommendations for Other Services       Precautions / Restrictions Precautions Precautions: Other (comment) Precaution Comments: Rt IJ CRRT, LE edema Restrictions Weight Bearing Restrictions: No     Mobility  Bed Mobility Overal bed mobility: Needs Assistance Bed Mobility: Sit to Supine;Supine to Sit     Supine to sit: Supervision;HOB elevated Sit to supine: Supervision   General bed mobility comments: for line safety and CRRT line mgmt    Transfers Overall transfer level: Needs assistance Equipment used: None;Rollator (4 wheels) Transfers: Sit to/from Stand Sit to Stand: Supervision           General transfer comment: supervision for lines    Ambulation/Gait Ambulation/Gait assistance: Supervision Gait Distance (Feet): 40 Feet Assistive device: Rollator (4 wheels) Gait Pattern/deviations: Step-through pattern;Decreased stride length Gait velocity: decr Gait velocity interpretation: 1.31 - 2.62 ft/sec, indicative of limited community ambulator   General Gait Details: Repeated back and forth the lenght of CRRT lines would allow.  Supervision for lines.   Stairs             Wheelchair Mobility    Modified Rankin (Stroke Patients Only)       Balance Overall balance assessment: Needs assistance Sitting-balance support: No upper extremity supported Sitting balance-Leahy Scale: Good     Standing balance support: No upper extremity supported Standing balance-Leahy Scale: Fair                              Cognition Arousal/Alertness: Awake/alert Behavior During Therapy: WFL for tasks assessed/performed Overall Cognitive Status: Within Functional Limits for tasks assessed                                          Exercises General Exercises - Lower Extremity Hip ABduction/ADduction: AROM;Both;10 reps;Standing Hip Flexion/Marching: AROM;Both;10 reps;Standing Toe Raises: AROM;Both;10 reps;Standing Heel Raises: AROM;Both;10 reps;Standing Other Exercises Other Exercises: 5x sit to stand without use of UE    General Comments General comments (skin integrity, edema, etc.): VSS on RA      Pertinent Vitals/Pain Pain Assessment: No/denies pain Faces Pain Scale: Hurts a little bit Pain Location: L LE Pain Descriptors / Indicators: Grimacing;Guarding Pain Intervention(s): Monitored during session    Home Living                          Prior Function            PT Goals (current goals can now be found in the care plan section) Acute Rehab PT Goals Patient  Stated Goal: be able to return home and to work Progress towards PT goals: Progressing toward goals    Frequency    Min 3X/week      PT Plan Current plan remains appropriate    Co-evaluation              AM-PAC PT "6 Clicks" Mobility   Outcome Measure  Help needed turning from your back to your side while in a flat bed without using bedrails?: A Little Help needed moving from lying on your back to sitting on the side of a flat bed without using bedrails?: A Little Help needed moving  to and from a bed to a chair (including a wheelchair)?: A Little Help needed standing up from a chair using your arms (e.g., wheelchair or bedside chair)?: A Little Help needed to walk in hospital room?: A Little Help needed climbing 3-5 steps with a railing? : A Little 6 Click Score: 18    End of Session   Activity Tolerance: Patient tolerated treatment well Patient left: in chair;with call bell/phone within reach Nurse Communication: Mobility status PT Visit Diagnosis: Other abnormalities of gait and mobility (R26.89);Difficulty in walking, not elsewhere classified (R26.2);Muscle weakness (generalized) (M62.81)     Time: 1287-8676 PT Time Calculation (min) (ACUTE ONLY): 20 min  Charges:  $Therapeutic Exercise: 8-22 mins                     [place] Pager [phone] Office [place] 08/08/2021, 6:53 PM

## 2021-08-09 DIAGNOSIS — R601 Generalized edema: Secondary | ICD-10-CM | POA: Diagnosis not present

## 2021-08-09 DIAGNOSIS — I5033 Acute on chronic diastolic (congestive) heart failure: Secondary | ICD-10-CM | POA: Diagnosis not present

## 2021-08-09 LAB — RENAL FUNCTION PANEL
Albumin: 3.6 g/dL (ref 3.5–5.0)
Albumin: 4 g/dL (ref 3.5–5.0)
Anion gap: 7 (ref 5–15)
Anion gap: 9 (ref 5–15)
BUN: 22 mg/dL — ABNORMAL HIGH (ref 6–20)
BUN: 22 mg/dL — ABNORMAL HIGH (ref 6–20)
CO2: 26 mmol/L (ref 22–32)
CO2: 25 mmol/L (ref 22–32)
Calcium: 9 mg/dL (ref 8.9–10.3)
Calcium: 9.5 mg/dL (ref 8.9–10.3)
Chloride: 99 mmol/L (ref 98–111)
Chloride: 98 mmol/L (ref 98–111)
Creatinine, Ser: 1.74 mg/dL — ABNORMAL HIGH (ref 0.44–1.00)
Creatinine, Ser: 1.83 mg/dL — ABNORMAL HIGH (ref 0.44–1.00)
GFR, Estimated: 36 mL/min — ABNORMAL LOW (ref >60)
GFR, Estimated: 34 mL/min — ABNORMAL LOW (ref >60)
Glucose, Bld: 83 mg/dL (ref 70–99)
Glucose, Bld: 139 mg/dL — ABNORMAL HIGH (ref 70–99)
Phosphorus: 2.3 mg/dL — ABNORMAL LOW (ref 2.5–4.6)
Phosphorus: 2.1 mg/dL — ABNORMAL LOW (ref 2.5–4.6)
Potassium: 4.7 mmol/L (ref 3.5–5.1)
Potassium: 5.4 mmol/L — ABNORMAL HIGH (ref 3.5–5.1)
Sodium: 132 mmol/L — ABNORMAL LOW (ref 135–145)
Sodium: 132 mmol/L — ABNORMAL LOW (ref 135–145)

## 2021-08-09 LAB — CBC
HCT: 33.7 % — ABNORMAL LOW (ref 36.0–46.0)
Hemoglobin: 10.5 g/dL — ABNORMAL LOW (ref 12.0–15.0)
MCH: 31.2 pg (ref 26.0–34.0)
MCHC: 31.2 g/dL (ref 30.0–36.0)
MCV: 100 fL (ref 80.0–100.0)
Platelets: 206 10*3/uL (ref 150–400)
RBC: 3.37 MIL/uL — ABNORMAL LOW (ref 3.87–5.11)
RDW: 16.1 % — ABNORMAL HIGH (ref 11.5–15.5)
WBC: 9.3 10*3/uL (ref 4.0–10.5)
nRBC: 0 % (ref 0.0–0.2)

## 2021-08-09 LAB — COOXEMETRY PANEL
Carboxyhemoglobin: 1.3 % (ref 0.5–1.5)
Methemoglobin: 0.8 % (ref 0.0–1.5)
O2 Saturation: 69 %
Total hemoglobin: 10.8 g/dL — ABNORMAL LOW (ref 12.0–16.0)

## 2021-08-09 LAB — APTT: aPTT: 74 seconds — ABNORMAL HIGH (ref 24–36)

## 2021-08-09 LAB — MAGNESIUM: Magnesium: 2.5 mg/dL — ABNORMAL HIGH (ref 1.7–2.4)

## 2021-08-09 MED ORDER — PRISMASOL BGK 0/2.5 32-2.5 MEQ/L EC SOLN
Status: DC
  Filled 2021-08-09 (×2): qty 5000

## 2021-08-09 MED ORDER — PRISMASOL BGK 0/2.5 32-2.5 MEQ/L EC SOLN
Status: DC
  Filled 2021-08-09 (×16): qty 5000

## 2021-08-09 MED ORDER — PRISMASOL BGK 0/2.5 32-2.5 MEQ/L EC SOLN
Status: DC
  Filled 2021-08-09 (×5): qty 5000

## 2021-08-09 MED ORDER — PREDNISONE 20 MG PO TABS
20.0000 mg | ORAL_TABLET | Freq: Every day | ORAL | Status: AC
  Administered 2021-08-09 – 2021-08-13 (×5): 20 mg via ORAL
  Filled 2021-08-09 (×5): qty 1

## 2021-08-09 NOTE — Progress Notes (Signed)
Patient ID: Lauren Osborn, female   DOB: July 03, 1976, 45 y.o.   MRN: [id number]     Advanced Heart Failure Rounding Note  PCP-Cardiologist: Rozann Lesches, MD   Subjective:    [place] 11/22 w/ markedly elevated left and right filling pressure, mod pulmonary venous HTN and preserved CO.  She did not respond to maximal diuretics + milrinone 0.125, CVVH started.   Off milrinone. Co-ox 69%, CVP 22.   Remains on CVVH currently pulling net negative UF about 300/hr. Weight down 10 lbs.   Denies SOB, orthopnea or PND. Left ankle hurting again, had Dilaudid.   [place] 07/26/21  Right Heart Pressures RHC Procedural Findings: Hemodynamics (mmHg) RA mean 35 RV 57/28, 35 PA 58/29, mean 46 PCWP mean 42  Oxygen saturations: PA 70% AO 99%  Cardiac Output (Fick) 7.04  Cardiac Index (Fick) 3.01 PVR < 1 WU PAPI < 1    Objective:   Weight Range: 99.6 kg Body mass index is 38.9 kg/m.   Vital Signs:   Temp:  [97.4 F (36.3 C)-97.9 F (36.6 C)] 97.9 F (36.6 C) (12/06 0400) Pulse Rate:  [62-79] 74 (12/06 0700) Resp:  [13-50] 20 (12/06 0700) BP: (79-112)/(58-86) 97/62 (12/06 0700) SpO2:  [96 %-100 %] 97 % (12/06 0700) Last BM Date: 08/08/21  Weight change: Filed Weights   08/06/21 1400 08/07/21 1530 08/08/21 0800  Weight: (S) 109.4 kg (S) 103.9 kg 99.6 kg    Intake/Output:   Intake/Output Summary (Last 24 hours) at 08/09/2021 0808 Last data filed at 08/09/2021 0704 Gross per 24 hour  Intake 1232 ml  Output 7279 ml  Net -6047 ml      Physical Exam   General: NAD Neck: JVP 16+, no thyromegaly or thyroid nodule.  Lungs: Clear to auscultation bilaterally with normal respiratory effort. CV: Nondisplaced PMI.  Heart regular S1/S2, no S3/S4, no murmur.  1+ edema to thighs.  No carotid bruit.  Normal pedal pulses.  Abdomen: Soft, nontender, no hepatosplenomegaly, no distention.  Skin: Intact without lesions or rashes.  Neurologic: Alert and oriented x 3.  Psych: Normal  affect. Extremities: No clubbing or cyanosis.  HEENT: Normal.    Telemetry   NSR 60-70s (personally reviewed)   Labs    CBC Recent Labs    08/08/21 0415 08/09/21 0408  WBC 9.7 9.3  HGB 10.8* 10.5*  HCT 33.8* 33.7*  MCV 99.4 100.0  PLT 203 970   Basic Metabolic Panel Recent Labs    08/08/21 0415 08/08/21 1618 08/09/21 0408  NA 132* 132* 132*  K 4.7 4.7 4.7  CL 99 97* 99  CO2 27 25 26   GLUCOSE 83 88 83  BUN 27* 25* 22*  CREATININE 1.83* 1.82* 1.74*  CALCIUM 9.0 8.4* 9.0  MG 2.6*  --  2.5*  PHOS 2.4* 4.1 2.3*   Liver Function Tests Recent Labs    08/08/21 1618 08/09/21 0408  ALBUMIN 3.7 3.6   No results for input(s): LIPASE, AMYLASE in the last 72 hours. Cardiac Enzymes No results for input(s): CKTOTAL, CKMB, CKMBINDEX, TROPONINI in the last 72 hours.  BNP: BNP (last 3 results) Recent Labs    03/04/21 1222 06/23/21 1807 07/22/21 1755  BNP 543.0* 357.0* 510.0*    ProBNP (last 3 results) No results for input(s): PROBNP in the last 8760 hours.   D-Dimer No results for input(s): DDIMER in the last 72 hours. Hemoglobin A1C No results for input(s): HGBA1C in the last 72 hours. Fasting Lipid Panel No results for input(s): CHOL,  HDL, LDLCALC, TRIG, CHOLHDL, LDLDIRECT in the last 72 hours. Thyroid Function Tests No results for input(s): TSH, T4TOTAL, T3FREE, THYROIDAB in the last 72 hours.  Invalid input(s): FREET3  Other results:   Imaging    No results found.   Medications:     Scheduled Medications:  vitamin C  500 mg Oral Daily   Chlorhexidine Gluconate Cloth  6 each Topical Daily   cholecalciferol  1,000 Units Oral Daily   feeding supplement  237 mL Oral BID BM   heparin injection (subcutaneous)  5,000 Units Subcutaneous Q8H   midodrine  15 mg Oral TID WC   multivitamin  1 tablet Oral QHS   pantoprazole  40 mg Oral Daily   senna-docusate  1 tablet Oral QHS   sodium chloride flush  3 mL Intravenous Q12H    Infusions:    prismasol BGK 4/2.5 400 mL/hr at 08/09/21 0157    prismasol BGK 4/2.5 200 mL/hr at 08/08/21 1600   heparin 10,000 units/ 20 mL infusion syringe 1,000 Units/hr (08/09/21 0400)   prismasol BGK 4/2.5 1,500 mL/hr at 08/09/21 0453    PRN Medications: camphor-menthol, heparin, heparin, HYDROmorphone, polyethylene glycol    Assessment/Plan   1. Acute on chronic diastolic CHF: With prominent RV dysfunction.  Echo from 11/22 reviewed, EF 60-65%, normal RV systolic function with mild dilation, D-shaped septum consistent with RV pressure/volume overload. Patient is markedly volume overloaded with anasarca.  Unfortunately, patient developed worsening AKI with attempted diuresis. RHC  w/ markedly elevated left and right filling pressure, mod pulmonary venous HTN and preserved CO. PAPi low at 0.82. Minimal response to maximal diuretics and milrinone 0.125, CVVH started.  Co-ox has been stable off milrinone, 67%. CVVH ongoing, UF pulling net negative ~300 cc/hr.   CVP still elevated at 22.  Weight now down almost 100 lbs. SBP stable 80s-100s.  - Continue CVVH pulling ~300cc/hr today, she will need 1-2 more days.  - Continue midodrine 15 mg tid.  2. AKI on CKD stage IIIb: Baseline creatinine 1.9-2.3, bumped to 3.68 in the setting of severe volume overload.  Suspect cardiorenal syndrome. RHC w/ normal CO but low PAPI suggesting RV dysfunction.  Now on CVVH. She is anuric.  - Nephrology following. Continue CVVH as above. Would not attempt switch to IHD until euvolemic if at all possible - Suspect she is going to need long term HD.  3. Cirrhosis: Uncertain etiology, labs negative for viral hepatitis.  Not a heavy drinker.  May be due to NAFLD or RV failure.  4. OSA: Continue CPAP at night.  5. Hyponatremia: Managed with CVVHD 6. Morbid obesity - Body mass index is 38.9 kg/m. 7. Left ankle pain: Patient has history of gout. Symptoms improved with prednisone burst for gout, completed. Now with recurrent severe  pain.  - Prednisone 20 mg daily x 5 days.   CRITICAL CARE Performed by: Loralie Champagne  Total critical care time: 35 minutes  Critical care time was exclusive of separately billable procedures and treating other patients.  Critical care was necessary to treat or prevent imminent or life-threatening deterioration.  Critical care was time spent personally by me (independent of midlevel providers or residents) on the following activities: development of treatment plan with patient and/or surrogate as well as nursing, discussions with consultants, evaluation of patient's response to treatment, examination of patient, obtaining history from patient or surrogate, ordering and performing treatments and interventions, ordering and review of laboratory studies, ordering and review of radiographic studies, pulse oximetry and re-evaluation  of patient's condition.   Length of Stay: 49  Loralie Champagne, MD  08/09/2021, 8:08 AM  Advanced Heart Failure Team Pager [phone] (M-F; 7a - 5p)  Please contact [facility] for night-coverage after hours (5p -7a ) and weekends on [email or web address]

## 2021-08-09 NOTE — Progress Notes (Signed)
Notified Dr. Augustin Coupe with nephrology re: afternoon labs.  Patients K is 5.4. Patient has had no issues with CRRT today.

## 2021-08-09 NOTE — Progress Notes (Signed)
[facility] NEPHROLOGY PROGRESS NOTE  Assessment/ Plan:  # Acute kidney Injury on chronic kidney disease stage IIIb (baseline creatinine 1.9-2.3): Appears hemodynamically mediated in the setting of acute exacerbation of diastolic heart failure with worsening from ongoing diuresis.  Started on CRRT after refractory to diuretic therapy and currently tolerating UF; weight has improved but CVP remains high therefore plan to continue CRRT mainly for ultrafiltration.  All 4K bath and on fixed dose heparin.  She remains anuric without evidence of renal recovery therefore she will likely need dialysis.  Once volume is optimized with CRRT we will attempt IHD.  Will continue CRRT today, hopefully will be able to transition to IHD soon, aiming for net neg 200-250cc/hr on CRRT.  Discussed with the RN and patient  # Hyponatremia: Secondary to CHF exacerbation/impaired free water deficit and inappropriate ADH activation-stable on CRRT.  #  Acute exacerbation of diastolic heart failure: Refractory to diuretics and inotropes.  VQ scan negative for PE and right heart catheterization shows markedly elevated left and right heart filling pressures with moderate pulmonary venous hypertension and preserved cardiac output.  Echo with EF of 60 to 65%.  On CRRT for volume management.    #  Anemia: Hemoglobin and hematocrit stable. Monitor hemoglobin.  # Hypokalemia: Secondary to diuretic induced losses, replaced/corrected via oral route.  Now managing with CRRT.  #Liver cirrhosis: Hepatitis viral infection negative.  May be due to right heart failure.  #Hypotension: Milrinone stopped by cardiology and increased midodrine to 15mg  tid on 12/4.  Blood pressure acceptable and tolerating ultrafiltration.  Subjective: Seen and examined in ICU.  Currently on CRRT. No acute events, tolerating crrt. Discussed with RN, CVP's finally getting better, no longer consistently in the 20's.  Objective Vital signs in last  24 hours: Vitals:   08/09/21 0400 08/09/21 0500 08/09/21 0600 08/09/21 0700  BP:  95/69 97/65 97/62   Pulse: 66 73 69 74  Resp: (!) 50 (!) 23 14 20   Temp: 97.9 F (36.6 C)     TempSrc: Axillary     SpO2: 99% 100% 97% 97%  Weight:      Height:       Weight change: -4.29 kg  Intake/Output Summary (Last 24 hours) at 08/09/2021 0733 Last data filed at 08/09/2021 0704 Gross per 24 hour  Intake 1232 ml  Output 7563 ml  Net -6331 ml       Labs: Basic Metabolic Panel: Recent Labs  Lab 08/08/21 0415 08/08/21 1618 08/09/21 0408  NA 132* 132* 132*  K 4.7 4.7 4.7  CL 99 97* 99  CO2 27 25 26   GLUCOSE 83 88 83  BUN 27* 25* 22*  CREATININE 1.83* 1.82* 1.74*  CALCIUM 9.0 8.4* 9.0  PHOS 2.4* 4.1 2.3*   Liver Function Tests: Recent Labs  Lab 08/08/21 0415 08/08/21 1618 08/09/21 0408  ALBUMIN 3.6 3.7 3.6   No results for input(s): LIPASE, AMYLASE in the last 168 hours. No results for input(s): AMMONIA in the last 168 hours. CBC: Recent Labs  Lab 08/05/21 0352 08/06/21 0343 08/07/21 0400 08/08/21 0415 08/09/21 0408  WBC 9.7 8.8 10.1 9.7 9.3  HGB 10.0* 10.3* 10.7* 10.8* 10.5*  HCT 31.2* 32.2* 33.6* 33.8* 33.7*  MCV 98.4 98.2 99.4 99.4 100.0  PLT 210 185 174 203 206   Cardiac Enzymes: No results for input(s): CKTOTAL, CKMB, CKMBINDEX, TROPONINI in the last 168 hours. CBG: Recent Labs  Lab 08/02/21 1642  GLUCAP 92    Iron Studies: No results  for input(s): IRON, TIBC, TRANSFERRIN, FERRITIN in the last 72 hours. Studies/Results: No results found.  Medications: Infusions:   prismasol BGK 4/2.5 400 mL/hr at 08/09/21 0157    prismasol BGK 4/2.5 200 mL/hr at 08/08/21 1600   heparin 10,000 units/ 20 mL infusion syringe 1,000 Units/hr (08/09/21 0400)   prismasol BGK 4/2.5 1,500 mL/hr at 08/09/21 0453    Scheduled Medications:  vitamin C  500 mg Oral Daily   Chlorhexidine Gluconate Cloth  6 each Topical Daily   cholecalciferol  1,000 Units Oral Daily   feeding  supplement  237 mL Oral BID BM   heparin injection (subcutaneous)  5,000 Units Subcutaneous Q8H   midodrine  15 mg Oral TID WC   multivitamin  1 tablet Oral QHS   pantoprazole  40 mg Oral Daily   senna-docusate  1 tablet Oral QHS   sodium chloride flush  3 mL Intravenous Q12H    have reviewed scheduled and prn medications.  Physical Exam: General: Pleasant female, not in distress, laying flat in bed Heart:RRR, s1s2 nl Lungs: Clear bilateral anteriorly. Abdomen:soft, Non-tender, non-distended Extremities:LE pitting edema, dressings+boot in place Neuro: awake, alert Dialysis Access: Temporary HD catheter-rij  Lauren Osborn 08/09/2021,7:33 AM  LOS: 18 days

## 2021-08-09 NOTE — TOC CM/SW Note (Signed)
HF TOC CM spoke to pt at bedside with dtr, pt states she does work full-time but not aware of her benefits for short-term or long-term disability. She would like to apply for SS disability. Will provide pt with application from [facility]. They will assist pt with applying for SS disability. [place], Heart Failure TOC CM [phone]

## 2021-08-09 NOTE — TOC Progression Note (Addendum)
Transition of Care [facility]) - Progression Note    Patient Details  Name: Lauren Osborn MRN: [id number] Date of Birth: Sep 25, 1975  Transition of Care [facility]) CM/SW [place], [place] Phone Number: 08/09/2021, 4:20 PM  Clinical Narrative:    HF CSW spoke with [name] at bedside about a disability application with the [facility] per [facility] discussing disability and [name] was agreeable and provided her signature. [name] reported also speaking with her employer about disability as well. [name] reported her plan to return home and has been working with the [facility] about home health and needed DME. CSW to send disability application over to the [facility].  CSW will continue to follow throughout discharge.  Expected Discharge Plan: [place] Barriers to Discharge: Continued Medical Work up  Expected Discharge Plan and Services Expected Discharge Plan: [place] In-house Referral: Clinical Social Work Discharge Planning Services: CM Consult Post Acute Care Choice: [place] arrangements for the past 2 months: Single Family Home                                       Social Determinants of Health (SDOH) Interventions Food Insecurity Interventions: Intervention Not Indicated Financial Strain Interventions: Intervention Not Indicated Housing Interventions: Intervention Not Indicated Transportation Interventions: Intervention Not Indicated  Readmission Risk Interventions Readmission Risk Prevention Plan 07/23/2021 06/24/2021  Transportation Screening Complete Complete  Home Care Screening Complete Complete  Medication Review (RN CM) Complete Complete  Some recent data might be hidden   Jadin Kagel, MSW, LCSWA [phone] Heart Failure Social Worker

## 2021-08-10 DIAGNOSIS — I5033 Acute on chronic diastolic (congestive) heart failure: Secondary | ICD-10-CM | POA: Diagnosis not present

## 2021-08-10 LAB — CBC
HCT: 33.8 % — ABNORMAL LOW (ref 36.0–46.0)
Hemoglobin: 10.8 g/dL — ABNORMAL LOW (ref 12.0–15.0)
MCH: 31.5 pg (ref 26.0–34.0)
MCHC: 32 g/dL (ref 30.0–36.0)
MCV: 98.5 fL (ref 80.0–100.0)
Platelets: 193 10*3/uL (ref 150–400)
RBC: 3.43 MIL/uL — ABNORMAL LOW (ref 3.87–5.11)
RDW: 16.1 % — ABNORMAL HIGH (ref 11.5–15.5)
WBC: 11.5 10*3/uL — ABNORMAL HIGH (ref 4.0–10.5)
nRBC: 0 % (ref 0.0–0.2)

## 2021-08-10 LAB — RENAL FUNCTION PANEL
Albumin: 3.5 g/dL (ref 3.5–5.0)
Albumin: 3.9 g/dL (ref 3.5–5.0)
Anion gap: 10 (ref 5–15)
Anion gap: 10 (ref 5–15)
BUN: 22 mg/dL — ABNORMAL HIGH (ref 6–20)
BUN: 23 mg/dL — ABNORMAL HIGH (ref 6–20)
CO2: 25 mmol/L (ref 22–32)
CO2: 24 mmol/L (ref 22–32)
Calcium: 9.1 mg/dL (ref 8.9–10.3)
Calcium: 9.3 mg/dL (ref 8.9–10.3)
Chloride: 96 mmol/L — ABNORMAL LOW (ref 98–111)
Chloride: 95 mmol/L — ABNORMAL LOW (ref 98–111)
Creatinine, Ser: 1.75 mg/dL — ABNORMAL HIGH (ref 0.44–1.00)
Creatinine, Ser: 1.93 mg/dL — ABNORMAL HIGH (ref 0.44–1.00)
GFR, Estimated: 36 mL/min — ABNORMAL LOW (ref >60)
GFR, Estimated: 32 mL/min — ABNORMAL LOW (ref >60)
Glucose, Bld: 86 mg/dL (ref 70–99)
Glucose, Bld: 115 mg/dL — ABNORMAL HIGH (ref 70–99)
Phosphorus: 2.5 mg/dL (ref 2.5–4.6)
Phosphorus: 2.6 mg/dL (ref 2.5–4.6)
Potassium: 4.4 mmol/L (ref 3.5–5.1)
Potassium: 4.7 mmol/L (ref 3.5–5.1)
Sodium: 131 mmol/L — ABNORMAL LOW (ref 135–145)
Sodium: 129 mmol/L — ABNORMAL LOW (ref 135–145)

## 2021-08-10 LAB — COOXEMETRY PANEL
Carboxyhemoglobin: 1.5 % (ref 0.5–1.5)
Methemoglobin: 0.8 % (ref 0.0–1.5)
O2 Saturation: 64.4 %
Total hemoglobin: 11.1 g/dL — ABNORMAL LOW (ref 12.0–16.0)

## 2021-08-10 LAB — APTT: aPTT: 75 seconds — ABNORMAL HIGH (ref 24–36)

## 2021-08-10 LAB — MAGNESIUM: Magnesium: 2.7 mg/dL — ABNORMAL HIGH (ref 1.7–2.4)

## 2021-08-10 MED ORDER — PRISMASOL BGK 4/2.5 32-4-2.5 MEQ/L REPLACEMENT SOLN: Status: DC

## 2021-08-10 MED ORDER — LORATADINE 10 MG PO TABS
10.0000 mg | ORAL_TABLET | Freq: Every day | ORAL | Status: DC | PRN
  Administered 2021-08-10: 12:00:00 10 mg via ORAL
  Filled 2021-08-10: qty 1

## 2021-08-10 NOTE — Progress Notes (Signed)
Physical Therapy Treatment Patient Details Name: Lauren Osborn MRN: [id number] DOB: 1976/07/22 Today's Date: 08/10/2021   History of Present Illness 45 yo admitted 11/18 with CHF, AKI on CKD, cirrhosis. 11/22 Rt heart cath. 11/25 CRRT started. PMhx: HTn, CHF, OSA, gout, obesity    PT Comments    Patient progressing with balance activities this session able to demonstrate semitandem with head turns and feet together eyes closed with no LOB for 30 sec.  She seems eager to progress when able to be off CRRT.  PT will continue to follow acutely.   Recommendations for follow up therapy are one component of a multi-disciplinary discharge planning process, led by the attending physician.  Recommendations may be updated based on patient status, additional functional criteria and insurance authorization.  Follow Up Recommendations  Home health PT     Assistance Recommended at Discharge Intermittent Supervision/Assistance  Equipment Recommendations  Rollator (4 wheels)    Recommendations for Other Services       Precautions / Restrictions Precautions Precaution Comments: Rt IJ CRRT, LE edema     Mobility  Bed Mobility               General bed mobility comments: in recliner    Transfers Overall transfer level: Needs assistance Equipment used: Rolling walker (2 wheels) Transfers: Sit to/from Stand Sit to Stand: Supervision           General transfer comment: supervision for lines    Ambulation/Gait Ambulation/Gait assistance: Supervision Gait Distance (Feet): 80 Feet (about 8 trips fwd and back ~5' length of CRRT tubing) Assistive device: Rolling walker (2 wheels) Gait Pattern/deviations: Step-through pattern;Step-to pattern       General Gait Details: forward and back length of CRRT lines, S for lines   Stairs             Wheelchair Mobility    Modified Rankin (Stroke Patients Only)       Balance     Sitting balance-Leahy Scale: Good      Standing balance support: No upper extremity supported Standing balance-Leahy Scale: Good                 High Level Balance Comments: static balance activities feet together eyes closed, semi tandem eyes open, then with head turns            Cognition Arousal/Alertness: Awake/alert Behavior During Therapy: WFL for tasks assessed/performed Overall Cognitive Status: Within Functional Limits for tasks assessed                                          Exercises General Exercises - Lower Extremity Hip ABduction/ADduction: AROM;Both;10 reps;Standing Hip Flexion/Marching: AROM;Both;10 reps;Standing Heel Raises: AROM;Both;10 reps;Standing Mini-Sqauts: Strengthening;Both;10 reps;Standing    General Comments        Pertinent Vitals/Pain Faces Pain Scale: Hurts a little bit Pain Location: L LE Pain Descriptors / Indicators: Guarding Pain Intervention(s): Monitored during session    Home Living                          Prior Function            PT Goals (current goals can now be found in the care plan section) Progress towards PT goals: Progressing toward goals    Frequency    Min 3X/week      PT Plan Current  plan remains appropriate    Co-evaluation              AM-PAC PT "6 Clicks" Mobility   Outcome Measure  Help needed turning from your back to your side while in a flat bed without using bedrails?: A Little Help needed moving from lying on your back to sitting on the side of a flat bed without using bedrails?: A Little Help needed moving to and from a bed to a chair (including a wheelchair)?: A Little Help needed standing up from a chair using your arms (e.g., wheelchair or bedside chair)?: A Little Help needed to walk in hospital room?: A Little Help needed climbing 3-5 steps with a railing? : Total 6 Click Score: 16    End of Session Equipment Utilized During Treatment: Gait belt Activity Tolerance: Patient  tolerated treatment well Patient left: in chair;with call bell/phone within reach   PT Visit Diagnosis: Other abnormalities of gait and mobility (R26.89);Difficulty in walking, not elsewhere classified (R26.2);Muscle weakness (generalized) (M62.81)     Time: 1914-7829 PT Time Calculation (min) (ACUTE ONLY): 32 min  Charges:  $Gait Training: 8-22 mins $Therapeutic Exercise: 8-22 mins                     Magda Kiel, PT Acute Rehabilitation Services Pager:[phone] [phone] 08/10/2021    Lauren Osborn 08/10/2021, 5:57 PM

## 2021-08-10 NOTE — Progress Notes (Addendum)
Patient ID: Lauren Osborn, female   DOB: 02-22-1976, 45 y.o.   MRN: [id number]     Advanced Heart Failure Rounding Note  PCP-Cardiologist: Rozann Lesches, MD   Subjective:    [place] 11/22 w/ markedly elevated left and right filling pressure, mod pulmonary venous HTN and preserved CO.  She did not respond to maximal diuretics + milrinone 0.125, CVVH started.   Off milrinone. Co-ox 64%. CVP 16.  Remains on CVVH, pulling for net negative 250-300 cc/hr. -6.9L yesterday. Weight down another 8 lb, total 119 lb.   Left ankle pain improving with prednisone.   Feeling better each day. No dyspnea at rest.  RHC 07/26/21  Right Heart Pressures RHC Procedural Findings: Hemodynamics (mmHg) RA mean 35 RV 57/28, 35 PA 58/29, mean 46 PCWP mean 42  Oxygen saturations: PA 70% AO 99%  Cardiac Output (Fick) 7.04  Cardiac Index (Fick) 3.01 PVR < 1 WU PAPI < 1    Objective:   Weight Range: 89.7 kg Body mass index is 35.03 kg/m.   Vital Signs:   Temp:  [97.5 F (36.4 C)-98.2 F (36.8 C)] 97.9 F (36.6 C) (12/07 0400) Pulse Rate:  [58-90] 64 (12/07 0612) Resp:  [11-40] 16 (12/07 0612) BP: (82-103)/(51-79) 84/66 (12/07 0612) SpO2:  [97 %-100 %] 99 % (12/07 0612) Weight:  [89.7 kg-93.3 kg] 89.7 kg (12/07 0500) Last BM Date: 08/09/21 (Per day RN)  Weight change: Filed Weights   08/08/21 0800 08/09/21 1600 08/10/21 0500  Weight: 99.6 kg 93.3 kg 89.7 kg    Intake/Output:   Intake/Output Summary (Last 24 hours) at 08/10/2021 0653 Last data filed at 08/10/2021 0600 Gross per 24 hour  Intake 888 ml  Output 8138 ml  Net -7250 ml      Physical Exam   General:  Lying comfortably in bed HEENT: normal Neck: supple. JVP 14-16 cm. R IJ HD cath.  Cor: PMI nondisplaced. Regular rate & rhythm. No rubs, gallops or murmurs. Lungs: clear Abdomen: soft, nontender, nondistended. No hepatosplenomegaly. No bruits or masses. Good bowel sounds. Extremities: no cyanosis, clubbing, rash, 1+  edema, + UNNA Neuro: alert & orientedx3, cranial nerves grossly intact. moves all 4 extremities w/o difficulty. Affect pleasant    Telemetry   NSR 60s - 70s (personally reviewed)   Labs    CBC Recent Labs    08/09/21 0408 08/10/21 0506  WBC 9.3 11.5*  HGB 10.5* 10.8*  HCT 33.7* 33.8*  MCV 100.0 98.5  PLT 206 093   Basic Metabolic Panel Recent Labs    08/09/21 0408 08/09/21 1610 08/10/21 0506  NA 132* 132* 131*  K 4.7 5.4* 4.4  CL 99 98 96*  CO2 26 25 25   GLUCOSE 83 139* 86  BUN 22* 22* 22*  CREATININE 1.74* 1.83* 1.75*  CALCIUM 9.0 9.5 9.1  MG 2.5*  --  2.7*  PHOS 2.3* 2.1* 2.5   Liver Function Tests Recent Labs    08/09/21 1610 08/10/21 0506  ALBUMIN 4.0 3.5   No results for input(s): LIPASE, AMYLASE in the last 72 hours. Cardiac Enzymes No results for input(s): CKTOTAL, CKMB, CKMBINDEX, TROPONINI in the last 72 hours.  BNP: BNP (last 3 results) Recent Labs    03/04/21 1222 06/23/21 1807 07/22/21 1755  BNP 543.0* 357.0* 510.0*    ProBNP (last 3 results) No results for input(s): PROBNP in the last 8760 hours.   D-Dimer No results for input(s): DDIMER in the last 72 hours. Hemoglobin A1C No results for input(s): HGBA1C in  the last 72 hours. Fasting Lipid Panel No results for input(s): CHOL, HDL, LDLCALC, TRIG, CHOLHDL, LDLDIRECT in the last 72 hours. Thyroid Function Tests No results for input(s): TSH, T4TOTAL, T3FREE, THYROIDAB in the last 72 hours.  Invalid input(s): FREET3  Other results:   Imaging    No results found.   Medications:     Scheduled Medications:  vitamin C  500 mg Oral Daily   Chlorhexidine Gluconate Cloth  6 each Topical Daily   cholecalciferol  1,000 Units Oral Daily   feeding supplement  237 mL Oral BID BM   heparin injection (subcutaneous)  5,000 Units Subcutaneous Q8H   midodrine  15 mg Oral TID WC   multivitamin  1 tablet Oral QHS   pantoprazole  40 mg Oral Daily   predniSONE  20 mg Oral Q  breakfast   senna-docusate  1 tablet Oral QHS   sodium chloride flush  3 mL Intravenous Q12H    Infusions:  heparin 10,000 units/ 20 mL infusion syringe 1,000 Units/hr (08/10/21 0623)   prismasol BGK 2/2.5 dialysis solution 1,500 mL/hr at 08/10/21 0621   prismasol BGK 2/2.5 replacement solution 400 mL/hr at 08/09/21 2029   prismasol BGK 2/2.5 replacement solution 200 mL/hr at 08/09/21 2019    PRN Medications: camphor-menthol, heparin, heparin, HYDROmorphone, polyethylene glycol    Assessment/Plan   1. Acute on chronic diastolic CHF: With prominent RV dysfunction.  Echo from 11/22 reviewed, EF 60-65%, normal RV systolic function with mild dilation, D-shaped septum consistent with RV pressure/volume overload. Patient is markedly volume overloaded with anasarca.  Unfortunately, patient developed worsening AKI with attempted diuresis. RHC  w/ markedly elevated left and right filling pressure, mod pulmonary venous HTN and preserved CO. PAPi low at 0.82. Minimal response to maximal diuretics and milrinone 0.125, CVVH started.   -Now off milrinone with stable co-ox. CVVHD still going. CVP 16. Weight down nearly 120 lb.   SBP 80s-90s - Volume improving, but still overloaded. Continue CVVH for now, potentially switch to [facility] tomorrow or once filter clots off - Continue midodrine 15 mg tid.  2. AKI on CKD stage IIIb: Baseline creatinine 1.9-2.3, bumped to 3.68 in the setting of severe volume overload.  Suspect cardiorenal syndrome. RHC w/ normal CO but low PAPI suggesting RV dysfunction.  Now on CVVH. She is anuric.  - Nephrology following. Continue CVVH as above.  - Potentially switching to [facility] tomorrow. Discussed with Nephrology at bedside. - Suspect she is going to need long term HD.  3. Cirrhosis: Uncertain etiology, labs negative for viral hepatitis.  Not a heavy drinker.  May be due to NAFLD or RV failure.  4. OSA: Continue CPAP at night.  5. Hyponatremia: Managed with CVVHD - Na 131  today 6. Morbid obesity - Body mass index is 35.03 kg/m. 7. Left ankle pain: Patient has history of gout. Symptoms improved with prednisone burst for gout, completed. - Had recurrent pain 12/06. Prednisone restarted at 20 mg daily X 5 days   Length of Stay: [place], [place], PA-C  08/10/2021, 6:53 AM  Advanced Heart Failure Team Pager [phone] (M-F; 7a - 5p)  Please contact [facility] for night-coverage after hours (5p -7a ) and weekends on [email or web address]  Patient seen with PA, agree with the above note.   Weight now down > 100 lbs with CVVH.  CVP 17-18 for me today.  Left ankle pain much improved with prednisone, able to stand up to weigh today.   General: NAD Neck: JVP  16 cm, no thyromegaly or thyroid nodule.  Lungs: Clear to auscultation bilaterally with normal respiratory effort. CV: Nondisplaced PMI.  Heart regular S1/S2, no S3/S4, no murmur.  1+ ankle edema.    Abdomen: Soft, nontender, no hepatosplenomegaly, no distention.  Skin: Intact without lesions or rashes.  Neurologic: Alert and oriented x 3.  Psych: Normal affect. Extremities: No clubbing or cyanosis.  HEENT: Normal.   Still volume overloaded but improving with CVVH. CVP 17-18 today.  SBP stable on midodrine.  Hopefully one more day of CVVH with UF around 300 cc/hr net will have her optimized enough to stop and proceed with iHD.  I do not think that she will have meaningful renal recovery.    Continue prednisone burst for left ankle gout.   CRITICAL CARE Performed by: Loralie Champagne  Total critical care time: 35 minutes  Critical care time was exclusive of separately billable procedures and treating other patients.  Critical care was necessary to treat or prevent imminent or life-threatening deterioration.  Critical care was time spent personally by me on the following activities: development of treatment plan with patient and/or surrogate as well as nursing, discussions with consultants, evaluation of  patient's response to treatment, examination of patient, obtaining history from patient or surrogate, ordering and performing treatments and interventions, ordering and review of laboratory studies, ordering and review of radiographic studies, pulse oximetry and re-evaluation of patient's condition.   Loralie Champagne 08/10/2021 7:38 AM

## 2021-08-10 NOTE — Progress Notes (Signed)
[facility] NEPHROLOGY PROGRESS NOTE  Assessment/ Plan:  # Acute kidney Injury on chronic kidney disease stage IIIb (baseline creatinine 1.9-2.3): Appears hemodynamically mediated in the setting of acute exacerbation of diastolic heart failure with worsening from ongoing diuresis.  Started on CRRT after refractory to diuretic therapy and currently tolerating UF; weight has improved but CVP remains high therefore plan to continue CRRT mainly for ultrafiltration.  All 4K bath and on fixed dose heparin.  She remains anuric without evidence of renal recovery therefore she will likely need dialysis.  Once volume is optimized with CRRT we will attempt IHD. Tentative plan: let CRRT filter expire or clot off (do not restart) and aim for IHD tomorrow. Will need temp line converted to [facility] at some point  # Hyponatremia: Secondary to CHF exacerbation/impaired free water deficit and inappropriate ADH activation-stable on CRRT/UF as tolerated  #  Acute exacerbation of diastolic heart failure: Refractory to diuretics and inotropes.  VQ scan negative for PE and right heart catheterization shows markedly elevated left and right heart filling pressures with moderate pulmonary venous hypertension and preserved cardiac output.  Echo with EF of 60 to 65%.  On CRRT for volume management.    #  Anemia: Hemoglobin and hematocrit stable. Monitor hemoglobin.  #Liver cirrhosis: Hepatitis viral infection negative.  May be due to right heart failure.  #Hypotension: Milrinone stopped by cardiology and increased midodrine to 15mg  tid on 12/4.  Blood pressure acceptable and tolerating ultrafiltration.  Subjective: Seen and examined in ICU.  Currently on CRRT. No acute events, tolerating crrt. K 5.4 yesterday evening, all bags are now 2K. K 4.4. CVP around 15 this am. Net neg ~6.8L Objective Vital signs in last 24 hours: Vitals:   08/10/21 0500 08/10/21 0504 08/10/21 0612 08/10/21 0700  BP:  (!) 86/69 (!) 84/66  98/61  Pulse:  72 64 73  Resp:  19 16 16   Temp:      TempSrc:      SpO2:  98% 99% 100%  Weight: 89.7 kg     Height:       Weight change: -6.31 kg  Intake/Output Summary (Last 24 hours) at 08/10/2021 0718 Last data filed at 08/10/2021 0700 Gross per 24 hour  Intake 710 ml  Output 8120 ml  Net -7410 ml       Labs: Basic Metabolic Panel: Recent Labs  Lab 08/09/21 0408 08/09/21 1610 08/10/21 0506  NA 132* 132* 131*  K 4.7 5.4* 4.4  CL 99 98 96*  CO2 26 25 25   GLUCOSE 83 139* 86  BUN 22* 22* 22*  CREATININE 1.74* 1.83* 1.75*  CALCIUM 9.0 9.5 9.1  PHOS 2.3* 2.1* 2.5   Liver Function Tests: Recent Labs  Lab 08/09/21 0408 08/09/21 1610 08/10/21 0506  ALBUMIN 3.6 4.0 3.5   No results for input(s): LIPASE, AMYLASE in the last 168 hours. No results for input(s): AMMONIA in the last 168 hours. CBC: Recent Labs  Lab 08/06/21 0343 08/07/21 0400 08/08/21 0415 08/09/21 0408 08/10/21 0506  WBC 8.8 10.1 9.7 9.3 11.5*  HGB 10.3* 10.7* 10.8* 10.5* 10.8*  HCT 32.2* 33.6* 33.8* 33.7* 33.8*  MCV 98.2 99.4 99.4 100.0 98.5  PLT 185 174 203 206 193   Cardiac Enzymes: No results for input(s): CKTOTAL, CKMB, CKMBINDEX, TROPONINI in the last 168 hours. CBG: No results for input(s): GLUCAP in the last 168 hours.   Iron Studies: No results for input(s): IRON, TIBC, TRANSFERRIN, FERRITIN in the last 72 hours. Studies/Results: No  results found.  Medications: Infusions:  heparin 10,000 units/ 20 mL infusion syringe 1,000 Units/hr (08/10/21 0700)   prismasol BGK 2/2.5 dialysis solution 1,500 mL/hr at 08/10/21 0621   prismasol BGK 2/2.5 replacement solution 400 mL/hr at 08/09/21 2029   prismasol BGK 2/2.5 replacement solution 200 mL/hr at 08/09/21 2019    Scheduled Medications:  vitamin C  500 mg Oral Daily   Chlorhexidine Gluconate Cloth  6 each Topical Daily   cholecalciferol  1,000 Units Oral Daily   feeding supplement  237 mL Oral BID BM   heparin injection  (subcutaneous)  5,000 Units Subcutaneous Q8H   midodrine  15 mg Oral TID WC   multivitamin  1 tablet Oral QHS   pantoprazole  40 mg Oral Daily   predniSONE  20 mg Oral Q breakfast   senna-docusate  1 tablet Oral QHS   sodium chloride flush  3 mL Intravenous Q12H    have reviewed scheduled and prn medications.  Physical Exam: General: Pleasant female, not in distress, laying flat in bed Heart:RRR, s1s2 nl Lungs: Clear bilateral anteriorly. Abdomen:soft, Non-tender, non-distended Extremities:LE pitting edema, dressings+boot in place Neuro: awake, alert Dialysis Access: Temporary HD catheter-rij  Lauren Osborn 08/10/2021,7:18 AM  LOS: 19 days

## 2021-08-11 DIAGNOSIS — I5033 Acute on chronic diastolic (congestive) heart failure: Secondary | ICD-10-CM | POA: Diagnosis not present

## 2021-08-11 LAB — RENAL FUNCTION PANEL
Albumin: 3.4 g/dL — ABNORMAL LOW (ref 3.5–5.0)
Albumin: 3.9 g/dL (ref 3.5–5.0)
Anion gap: 8 (ref 5–15)
Anion gap: 13 (ref 5–15)
BUN: 32 mg/dL — ABNORMAL HIGH (ref 6–20)
BUN: 25 mg/dL — ABNORMAL HIGH (ref 6–20)
CO2: 24 mmol/L (ref 22–32)
CO2: 23 mmol/L (ref 22–32)
Calcium: 9.2 mg/dL (ref 8.9–10.3)
Calcium: 9.3 mg/dL (ref 8.9–10.3)
Chloride: 94 mmol/L — ABNORMAL LOW (ref 98–111)
Chloride: 95 mmol/L — ABNORMAL LOW (ref 98–111)
Creatinine, Ser: 2.3 mg/dL — ABNORMAL HIGH (ref 0.44–1.00)
Creatinine, Ser: 1.94 mg/dL — ABNORMAL HIGH (ref 0.44–1.00)
GFR, Estimated: 26 mL/min — ABNORMAL LOW (ref >60)
GFR, Estimated: 32 mL/min — ABNORMAL LOW (ref >60)
Glucose, Bld: 82 mg/dL (ref 70–99)
Glucose, Bld: 104 mg/dL — ABNORMAL HIGH (ref 70–99)
Phosphorus: 3.1 mg/dL (ref 2.5–4.6)
Phosphorus: 2.2 mg/dL — ABNORMAL LOW (ref 2.5–4.6)
Potassium: 4.5 mmol/L (ref 3.5–5.1)
Potassium: 3.9 mmol/L (ref 3.5–5.1)
Sodium: 126 mmol/L — ABNORMAL LOW (ref 135–145)
Sodium: 131 mmol/L — ABNORMAL LOW (ref 135–145)

## 2021-08-11 LAB — CBC
HCT: 32.2 % — ABNORMAL LOW (ref 36.0–46.0)
Hemoglobin: 10.4 g/dL — ABNORMAL LOW (ref 12.0–15.0)
MCH: 31.4 pg (ref 26.0–34.0)
MCHC: 32.3 g/dL (ref 30.0–36.0)
MCV: 97.3 fL (ref 80.0–100.0)
Platelets: 202 10*3/uL (ref 150–400)
RBC: 3.31 MIL/uL — ABNORMAL LOW (ref 3.87–5.11)
RDW: 15.9 % — ABNORMAL HIGH (ref 11.5–15.5)
WBC: 11.2 10*3/uL — ABNORMAL HIGH (ref 4.0–10.5)
nRBC: 0 % (ref 0.0–0.2)

## 2021-08-11 LAB — APTT: aPTT: 34 seconds (ref 24–36)

## 2021-08-11 LAB — VITAMIN C: Vitamin C: 1.2 mg/dL (ref 0.4–2.0)

## 2021-08-11 LAB — COOXEMETRY PANEL
Carboxyhemoglobin: 1.5 % (ref 0.5–1.5)
Methemoglobin: 0.6 % (ref 0.0–1.5)
O2 Saturation: 76.9 %
Total hemoglobin: 10.6 g/dL — ABNORMAL LOW (ref 12.0–16.0)

## 2021-08-11 LAB — VITAMIN B6: Vitamin B6: 32 ug/L (ref 3.4–65.2)

## 2021-08-11 LAB — COPPER, SERUM: Copper: 151 ug/dL (ref 80–158)

## 2021-08-11 LAB — MAGNESIUM: Magnesium: 2.4 mg/dL (ref 1.7–2.4)

## 2021-08-11 LAB — HEPATITIS B SURFACE ANTIBODY,QUALITATIVE: Hep B S Ab: NONREACTIVE

## 2021-08-11 LAB — HEPATITIS B SURFACE ANTIGEN: Hepatitis B Surface Ag: NONREACTIVE

## 2021-08-11 MED ORDER — LIDOCAINE HCL (PF) 1 % IJ SOLN: 5.0000 mL | INTRAMUSCULAR | Status: DC | PRN

## 2021-08-11 MED ORDER — LIDOCAINE-PRILOCAINE 2.5-2.5 % EX CREA
1.0000 "application " | TOPICAL_CREAM | CUTANEOUS | Status: DC | PRN
  Filled 2021-08-11: qty 5

## 2021-08-11 MED ORDER — SODIUM CHLORIDE 0.9 % IV SOLN: 100.0000 mL | INTRAVENOUS | Status: DC | PRN

## 2021-08-11 MED ORDER — HEPARIN SODIUM (PORCINE) 1000 UNIT/ML DIALYSIS
1000.0000 [IU] | INTRAMUSCULAR | Status: DC | PRN
  Administered 2021-08-11 – 2021-08-12 (×2): 1000 [IU] via INTRAVENOUS_CENTRAL
  Filled 2021-08-11 (×4): qty 1

## 2021-08-11 MED ORDER — ALTEPLASE 2 MG IJ SOLR
2.0000 mg | Freq: Once | INTRAMUSCULAR | Status: DC | PRN
  Filled 2021-08-11: qty 2

## 2021-08-11 MED ORDER — CHLORHEXIDINE GLUCONATE CLOTH 2 % EX PADS
6.0000 | MEDICATED_PAD | Freq: Every day | CUTANEOUS | Status: DC
  Administered 2021-08-13: 6 via TOPICAL

## 2021-08-11 MED ORDER — ALBUMIN HUMAN 25 % IV SOLN
INTRAVENOUS | Status: AC
  Administered 2021-08-11: 12.5 g
  Filled 2021-08-11: qty 100

## 2021-08-11 MED ORDER — PENTAFLUOROPROP-TETRAFLUOROETH EX AERO: 1.0000 "application " | INHALATION_SPRAY | CUTANEOUS | Status: DC | PRN

## 2021-08-11 NOTE — Progress Notes (Addendum)
[facility] NEPHROLOGY PROGRESS NOTE  Assessment/ Plan:  # Acute kidney Injury on chronic kidney disease stage IIIb (baseline creatinine 1.9-2.3): Appears hemodynamically mediated in the setting of acute exacerbation of diastolic heart failure with worsening from ongoing diuresis.  Started on CRRT after refractory to diuretic therapy. She remains anuric without evidence of renal recovery therefore she will likely need long term dialysis. -CRRT clotted off last night but not restarted. Will attempt IHD today with a UF goal of around 2-3L to see if she will tolerate (will need midodrine prior to treatment). If not tolerating IHD would have a low threshold to get her back on CRRT to further optimize her vol status. If doing okay with IHD and she's stable, will need to have temp line converted to [facility] w/ IR at some point -will tentatively plan for IHD again tomorrow (provided she tolerates it today) for more volume removal given elevated CVPs. Discussed with HF team.  # Hyponatremia: Secondary to CHF exacerbation/impaired free water deficit and inappropriate ADH activation-lower today but off CRRT, UF as tolerated with IHD today  #  Acute exacerbation of diastolic heart failure: Refractory to diuretics and inotropes.  VQ scan negative for PE and right heart catheterization shows markedly elevated left and right heart filling pressures with moderate pulmonary venous hypertension and preserved cardiac output.  Echo with EF of 60 to 65%.  Vol mgmt w/ renal replacement therapy  #  Anemia: Hemoglobin and hematocrit stable. Monitor hemoglobin.  #Liver cirrhosis: Hepatitis viral infection negative.  May be due to right heart failure.  #Hypotension: Milrinone stopped by cardiology and increased midodrine to 15mg  tid on 12/4.  Blood pressure acceptable and tolerating ultrafiltration.  Subjective: Seen and examined in ICU.  Crrt clotted off last night around 9pm, not restarted. CVP still high,  around 19. Objective Vital signs in last 24 hours: Vitals:   08/11/21 0404 08/11/21 0500 08/11/21 0600 08/11/21 0700  BP: (!) 88/65 (!) 80/59 91/62 92/74   Pulse: 63 (!) 59 (!) 59 (!) 102  Resp: (!) 42 (!) 33 20 (!) 21  Temp:      TempSrc:      SpO2: 99% 100% 100% 100%  Weight:  88.7 kg    Height:       Weight change: -4.486 kg  Intake/Output Summary (Last 24 hours) at 08/11/2021 0755 Last data filed at 08/11/2021 0700 Gross per 24 hour  Intake 1480 ml  Output 4144 ml  Net -2664 ml       Labs: Basic Metabolic Panel: Recent Labs  Lab 08/10/21 0506 08/10/21 1607 08/11/21 0400  NA 131* 129* 126*  K 4.4 4.7 4.5  CL 96* 95* 94*  CO2 25 24 24   GLUCOSE 86 115* 82  BUN 22* 23* 32*  CREATININE 1.75* 1.93* 2.30*  CALCIUM 9.1 9.3 9.2  PHOS 2.5 2.6 3.1   Liver Function Tests: Recent Labs  Lab 08/10/21 0506 08/10/21 1607 08/11/21 0400  ALBUMIN 3.5 3.9 3.4*   No results for input(s): LIPASE, AMYLASE in the last 168 hours. No results for input(s): AMMONIA in the last 168 hours. CBC: Recent Labs  Lab 08/07/21 0400 08/08/21 0415 08/09/21 0408 08/10/21 0506 08/11/21 0400  WBC 10.1 9.7 9.3 11.5* 11.2*  HGB 10.7* 10.8* 10.5* 10.8* 10.4*  HCT 33.6* 33.8* 33.7* 33.8* 32.2*  MCV 99.4 99.4 100.0 98.5 97.3  PLT 174 203 206 193 202   Cardiac Enzymes: No results for input(s): CKTOTAL, CKMB, CKMBINDEX, TROPONINI in the last 168 hours.  CBG: No results for input(s): GLUCAP in the last 168 hours.   Iron Studies: No results for input(s): IRON, TIBC, TRANSFERRIN, FERRITIN in the last 72 hours. Studies/Results: No results found.  Medications: Infusions:    Scheduled Medications:  vitamin C  500 mg Oral Daily   Chlorhexidine Gluconate Cloth  6 each Topical Daily   Chlorhexidine Gluconate Cloth  6 each Topical Q0600   cholecalciferol  1,000 Units Oral Daily   feeding supplement  237 mL Oral BID BM   heparin injection (subcutaneous)  5,000 Units Subcutaneous Q8H    midodrine  15 mg Oral TID WC   multivitamin  1 tablet Oral QHS   pantoprazole  40 mg Oral Daily   predniSONE  20 mg Oral Q breakfast   senna-docusate  1 tablet Oral QHS   sodium chloride flush  3 mL Intravenous Q12H    have reviewed scheduled and prn medications.  Physical Exam: General: Pleasant female, not in distress, sitting up in chair Heart:RRR, s1s2 nl Lungs: Clear bilateral anteriorly. Abdomen:soft, Non-tender, non-distended Extremities:LE pitting edema, dressings+boot in place Neuro: awake, alert Dialysis Access: Temporary HD catheter-rij  Neyland Pettengill 08/11/2021,7:55 AM  LOS: 20 days

## 2021-08-11 NOTE — Progress Notes (Signed)
Nutrition Follow-up  DOCUMENTATION CODES:   Obesity unspecified  INTERVENTION:   Given pt off CRRT and Vitamin C level appropriate, will discontinue Vitamin C 500 mg  Continue Renal MVI daily  Continue Ensure Enlive po BID, each supplement provides 350 kcal and 20 grams of protein  With current electrolyte levels, pt does not need further diet restriction (ie Renal Diet) at this time; will continue to monitor. Continue 2g sodium diet with 1200 mL fluid restriction  NUTRITION DIAGNOSIS:   Increased nutrient needs related to acute illness as evidenced by estimated needs.  Being addressed via supplements  GOAL:   Patient will meet greater than or equal to 90% of their needs  Progressing  MONITOR:   PO intake, Supplement acceptance, Labs, Weight trends  REASON FOR ASSESSMENT:   LOS, Rounds    ASSESSMENT:   45 yo female with acute on chronic CHF, AKI on CKD 3 requiring CRRT for significant volume removal, +cirrhosis/ PMH includes HTN, gout, GERD, CKD, liver hemangioma/liver disease, cholelithiasis, gastritis, esophagitis  11/25 CRRT initiation 12/07 CRRT discontinued 12/08 First iHD  First trial of iHD today  Appetite improved; recorded po intake mostly 100% of meals. Pt continues to drink Ensure Enlive supplements as well  Net negative 65 L. Noted 300 mL UOP today, previously anuric Weight 88.9 kg; same as previous day. Weight of 141 kg on admission. Pt does ankowledge tdhat she weighed 315 pounds prior to admission and she believes she had been weighing this for a while. Pt does not know her true dry weight  Vitamin Labs (08/08/21): Vitamin C: 1.2 (wdl) Folate: 11.3 (wdl) Vitamin B6: pending Copper: 151 (wdl)   Labs: sodium 126 (L) Meds: prednisone    Diet Order:   Diet Order             Diet 2 gram sodium Room service appropriate? Yes; Fluid consistency: Thin; Fluid restriction: 1200 mL Fluid  Diet effective now                   EDUCATION  NEEDS:   Not appropriate for education at this time  Skin:  Skin Assessment: Reviewed RN Assessment  Last BM:  12/8  Height:   Ht Readings from Last 1 Encounters:  07/23/21 5\' 3"  (1.6 m)    Weight:   Wt Readings from Last 1 Encounters:  08/11/21 88.9 kg    BMI:  Body mass index is 34.72 kg/m.  Estimated Nutritional Needs:   Kcal:  2000-2200 kcals  Protein:  100-120 g  Fluid:  1200 mL fluid restriction per MD  Kerman Passey MS, RDN, LDN, CNSC Registered Dietitian III Clinical Nutrition RD Pager and On-Call Pager Number Located in [place]

## 2021-08-11 NOTE — Progress Notes (Signed)
Pt has refused CPAP numerous times. CPAP order discontinued at this time per RT protocol.

## 2021-08-11 NOTE — Progress Notes (Signed)
Orthopedic Tech Progress Note Patient Details:  SHALICE WOODRING 31-Jan-1976 [id number]  Ortho Devices Type of Ortho Device: Haematologist Ortho Device/Splint Location: BLE Ortho Device/Splint Interventions: Application, Ordered   Post Interventions Patient Tolerated: Well Instructions Provided: Care of device  Tacha Manni A Kiyoko Mcguirt 08/11/2021, 5:10 PM

## 2021-08-11 NOTE — Consult Note (Signed)
   [facility] Inpatient Consult   08/11/2021  Lauren Osborn August 07, 1976 [id number]  Managed Medicaid[MM]: [employer]  Brief review for LLOS and readmission score, patient remains listed as ICU level of care.  Inpatient TOC team notes reviewed for plan and progression.  Plan: Continue to follow for post hospital needs for MM plan for referral needs.  For questions,  Natividad Brood, RN BSN [facility] Liaison  [phone] business mobile phone Toll free office [phone]  Fax number: [phone] [email or web address]

## 2021-08-11 NOTE — Progress Notes (Signed)
Palliative-   Chart reviewed. Patient progressing. Goals have been clarified for full scope, full code.  Palliative will sign off for now.  Please reconsult if further Palliative assistance is needed.   Mariana Kaufman, AGNP-C Palliative Medicine  Please call Palliative Medicine team phone with any questions [phone]. For individual providers please see AMION.

## 2021-08-11 NOTE — Progress Notes (Addendum)
Patient ID: Lauren Osborn, female   DOB: Jan 23, 1976, 45 y.o.   MRN: [id number]     Advanced Heart Failure Rounding Note  PCP-Cardiologist: Rozann Lesches, MD   Subjective:    [place] 11/22 w/ markedly elevated left and right filling pressure, mod pulmonary venous HTN and preserved CO.  She did not respond to maximal diuretics + milrinone 0.125, CVVH started.   Off milrinone. Co-ox 77%. CVP 23-24.    Off CVVHD. Filter clotted last night. Weight unchanged overnight. -2.7L last 24 hrs.   SBP 80s-90s  Na trending down, 132>131>129>126    RHC 07/26/21  Right Heart Pressures RHC Procedural Findings: Hemodynamics (mmHg) RA mean 35 RV 57/28, 35 PA 58/29, mean 46 PCWP mean 42  Oxygen saturations: PA 70% AO 99%  Cardiac Output (Fick) 7.04  Cardiac Index (Fick) 3.01 PVR < 1 WU PAPI < 1    Objective:   Weight Range: 88.7 kg Body mass index is 34.63 kg/m.   Vital Signs:   Temp:  [97.4 F (36.3 C)-98.5 F (36.9 C)] 98.5 F (36.9 C) (12/08 0300) Pulse Rate:  [59-84] 59 (12/08 0600) Resp:  [10-42] 20 (12/08 0600) BP: (80-103)/(54-81) 91/62 (12/08 0600) SpO2:  [96 %-100 %] 100 % (12/08 0600) Weight:  [88.7 kg-88.8 kg] 88.7 kg (12/08 0500) Last BM Date: 08/11/21  Weight change: Filed Weights   08/10/21 0500 08/10/21 2200 08/11/21 0500  Weight: 89.7 kg 88.8 kg 88.7 kg    Intake/Output:   Intake/Output Summary (Last 24 hours) at 08/11/2021 0705 Last data filed at 08/10/2021 2030 Gross per 24 hour  Intake 1240 ml  Output 4144 ml  Net -2904 ml      Physical Exam  CVP 23-24 General:  Sitting comfortably in chair. HEENT: normal Neck: supple. JVP to jaw. R IJ HD cath. Carotids 2+ bilat; no bruits.  Cor: PMI nondisplaced. Regular rate & rhythm. No rubs, gallops or murmurs. Lungs: clear Abdomen: soft, nontender, nondistended. No hepatosplenomegaly.  Extremities: no cyanosis, clubbing, rash, 1+ edema, UNNA on Neuro: alert & orientedx3, cranial nerves grossly intact.  moves all 4 extremities w/o difficulty. Affect pleasant     Telemetry   NSR 60s (personally reviewed)   Labs    CBC Recent Labs    08/10/21 0506 08/11/21 0400  WBC 11.5* 11.2*  HGB 10.8* 10.4*  HCT 33.8* 32.2*  MCV 98.5 97.3  PLT 193 672   Basic Metabolic Panel Recent Labs    08/10/21 0506 08/10/21 1607 08/11/21 0400  NA 131* 129* 126*  K 4.4 4.7 4.5  CL 96* 95* 94*  CO2 25 24 24   GLUCOSE 86 115* 82  BUN 22* 23* 32*  CREATININE 1.75* 1.93* 2.30*  CALCIUM 9.1 9.3 9.2  MG 2.7*  --  2.4  PHOS 2.5 2.6 3.1   Liver Function Tests Recent Labs    08/10/21 1607 08/11/21 0400  ALBUMIN 3.9 3.4*   No results for input(s): LIPASE, AMYLASE in the last 72 hours. Cardiac Enzymes No results for input(s): CKTOTAL, CKMB, CKMBINDEX, TROPONINI in the last 72 hours.  BNP: BNP (last 3 results) Recent Labs    03/04/21 1222 06/23/21 1807 07/22/21 1755  BNP 543.0* 357.0* 510.0*    ProBNP (last 3 results) No results for input(s): PROBNP in the last 8760 hours.   D-Dimer No results for input(s): DDIMER in the last 72 hours. Hemoglobin A1C No results for input(s): HGBA1C in the last 72 hours. Fasting Lipid Panel No results for input(s): CHOL, HDL, LDLCALC, TRIG,  CHOLHDL, LDLDIRECT in the last 72 hours. Thyroid Function Tests No results for input(s): TSH, T4TOTAL, T3FREE, THYROIDAB in the last 72 hours.  Invalid input(s): FREET3  Other results:   Imaging    No results found.   Medications:     Scheduled Medications:  vitamin C  500 mg Oral Daily   Chlorhexidine Gluconate Cloth  6 each Topical Daily   cholecalciferol  1,000 Units Oral Daily   feeding supplement  237 mL Oral BID BM   heparin injection (subcutaneous)  5,000 Units Subcutaneous Q8H   midodrine  15 mg Oral TID WC   multivitamin  1 tablet Oral QHS   pantoprazole  40 mg Oral Daily   predniSONE  20 mg Oral Q breakfast   senna-docusate  1 tablet Oral QHS   sodium chloride flush  3 mL  Intravenous Q12H    Infusions:   prismasol BGK 4/2.5 200 mL/hr at 08/10/21 0924   heparin 10,000 units/ 20 mL infusion syringe 1,000 Units/hr (08/10/21 2000)   prismasol BGK 2/2.5 dialysis solution 1,500 mL/hr at 08/10/21 1709   prismasol BGK 2/2.5 replacement solution 400 mL/hr at 08/10/21 0925    PRN Medications: camphor-menthol, heparin, heparin, HYDROmorphone, loratadine, polyethylene glycol    Assessment/Plan   1. Acute on chronic diastolic CHF: With prominent RV dysfunction.  Echo from 11/22 reviewed, EF 60-65%, normal RV systolic function with mild dilation, D-shaped septum consistent with RV pressure/volume overload. Patient is markedly volume overloaded with anasarca.  Unfortunately, patient developed worsening AKI with attempted diuresis. RHC  w/ markedly elevated left and right filling pressure, mod pulmonary venous HTN and preserved CO. PAPi low at 0.82. Minimal response to maximal diuretics and milrinone 0.125, CVVH started.   -Now off milrinone with stable co-ox. CVVHD still going. CVP 23. Weight down nearly 120 lb, however weight unchanged from yesterday.   SBP 80s-90s - Volume improving, but still overloaded. CVVHD stopped after filter clotted. Recommend starting iHD today if possible for volume removal. - Continue midodrine 15 mg tid.  2. AKI on CKD stage IIIb: Baseline creatinine 1.9-2.3, bumped to 3.68 in the setting of severe volume overload.  Suspect cardiorenal syndrome. RHC w/ normal CO but low PAPI suggesting RV dysfunction.  Now on CVVH. She is anuric.  - Nephrology following. Continue CVVH as above.  - Suspect she is going to need long term HD.  3. Cirrhosis: Uncertain etiology, labs negative for viral hepatitis.  Not a heavy drinker.  May be due to NAFLD or RV failure.  4. OSA: Continue CPAP at night.  5. Hyponatremia: Managed with CVVHD - Na 132.131>129>126 6. Morbid obesity - Body mass index is 34.63 kg/m. 7. Left ankle pain: Patient has history of gout.  Symptoms improved with prednisone burst for gout, completed. - Had recurrent pain 12/06. Prednisone restarted at 20 mg daily X 5 days -? If would benefit from allopurinol 3 X weekly once on iHD, discussed dosing with pharmD. Can start when symptoms resolved.   Length of Stay: [place], [place], PA-C  08/11/2021, 7:05 AM  Advanced Heart Failure Team Pager [phone] (M-F; 7a - 5p)  Please contact [facility] for night-coverage after hours (5p -7a ) and weekends on [email or web address]  Patient seen with PA, agree with the above note.   CVVH filter clotted yesterday evening and CVVH was stopped. CVP still 19 on my measure this morning.   General: NAD Neck: JVP 16 cm, no thyromegaly or thyroid nodule.  Lungs: Clear to auscultation bilaterally  with normal respiratory effort. CV: Nondisplaced PMI.  Heart regular S1/S2, no S3/S4, no murmur.  1+ ankle edema.  Abdomen: Soft, nontender, no hepatosplenomegaly, no distention.  Skin: Intact without lesions or rashes.  Neurologic: Alert and oriented x 3.  Psych: Normal affect. Extremities: No clubbing or cyanosis.  HEENT: Normal.   Patient still has excess fluid.  Would have been nice to continue CVVH until today but unfortunately now off.  She has lost a lot of weight but CVP still 19.  If she is to remain off CVVH, think she should have iHD today given significant residual volume.  I think she will be HD dependent.   BP stable with midodrine.   Loralie Champagne 08/11/2021 7:36 AM

## 2021-08-12 DIAGNOSIS — I5033 Acute on chronic diastolic (congestive) heart failure: Secondary | ICD-10-CM | POA: Diagnosis not present

## 2021-08-12 LAB — RENAL FUNCTION PANEL
Albumin: 3.8 g/dL (ref 3.5–5.0)
Anion gap: 11 (ref 5–15)
BUN: 35 mg/dL — ABNORMAL HIGH (ref 6–20)
CO2: 26 mmol/L (ref 22–32)
Calcium: 9.3 mg/dL (ref 8.9–10.3)
Chloride: 92 mmol/L — ABNORMAL LOW (ref 98–111)
Creatinine, Ser: 2.48 mg/dL — ABNORMAL HIGH (ref 0.44–1.00)
GFR, Estimated: 24 mL/min — ABNORMAL LOW (ref >60)
Glucose, Bld: 78 mg/dL (ref 70–99)
Phosphorus: 3.2 mg/dL (ref 2.5–4.6)
Potassium: 4 mmol/L (ref 3.5–5.1)
Sodium: 129 mmol/L — ABNORMAL LOW (ref 135–145)

## 2021-08-12 LAB — COOXEMETRY PANEL
Carboxyhemoglobin: 1.5 % (ref 0.5–1.5)
Methemoglobin: 0.7 % (ref 0.0–1.5)
O2 Saturation: 67.6 %
Total hemoglobin: 10.8 g/dL — ABNORMAL LOW (ref 12.0–16.0)

## 2021-08-12 LAB — MAGNESIUM: Magnesium: 2.2 mg/dL (ref 1.7–2.4)

## 2021-08-12 LAB — CBC
HCT: 32.1 % — ABNORMAL LOW (ref 36.0–46.0)
Hemoglobin: 10.5 g/dL — ABNORMAL LOW (ref 12.0–15.0)
MCH: 31.9 pg (ref 26.0–34.0)
MCHC: 32.7 g/dL (ref 30.0–36.0)
MCV: 97.6 fL (ref 80.0–100.0)
Platelets: 173 10*3/uL (ref 150–400)
RBC: 3.29 MIL/uL — ABNORMAL LOW (ref 3.87–5.11)
RDW: 15.9 % — ABNORMAL HIGH (ref 11.5–15.5)
WBC: 9.1 10*3/uL (ref 4.0–10.5)
nRBC: 0 % (ref 0.0–0.2)

## 2021-08-12 LAB — HEPATITIS B SURFACE ANTIBODY, QUANTITATIVE: Hep B S AB Quant (Post): <3.1 m[IU]/mL — ABNORMAL LOW (ref >9.9)

## 2021-08-12 MED ORDER — SODIUM CHLORIDE 0.9% FLUSH: 10.0000 mL | INTRAVENOUS | Status: DC | PRN

## 2021-08-12 MED ORDER — ALBUMIN HUMAN 25 % IV SOLN
25.0000 g | Freq: Every day | INTRAVENOUS | Status: DC | PRN
  Administered 2021-08-12 – 2021-08-20 (×3): 25 g via INTRAVENOUS
  Filled 2021-08-12 (×3): qty 100

## 2021-08-12 MED ORDER — SODIUM CHLORIDE 0.9% FLUSH
10.0000 mL | Freq: Two times a day (BID) | INTRAVENOUS | Status: DC
  Administered 2021-08-12 – 2021-08-16 (×7): 10 mL

## 2021-08-12 NOTE — Progress Notes (Signed)
Lauren Osborn NEPHROLOGY PROGRESS NOTE  Assessment/ Plan:  # Acute kidney Injury on chronic kidney disease stage IIIb (baseline creatinine 1.9-2.3): Appears hemodynamically mediated in the setting of acute exacerbation of diastolic heart failure with worsening from ongoing diuresis.  CRRT stopped on 12/8 and received intermittent HD.  She became hypotensive required albumin, midodrine with around 2 L of ultrafiltration.  No sign of renal recovery and remains fluid overload and CVP around 17.  Plan for another dialysis today.  She will be getting albumin and midodrine before HD.  I will order permanent HD catheter placement early next week.  Discussed with the cardiology team.  # Hyponatremia: Secondary to CHF exacerbation/impaired free water deficit and inappropriate ADH activation-now managed with dialysis..  #  Acute exacerbation of diastolic heart failure: Refractory to diuretics and inotropes.  VQ scan negative for PE and right heart catheterization shows markedly elevated left and right heart filling pressures with moderate pulmonary venous hypertension and preserved cardiac output.  Echo with EF of 60 to 65%.  Volume managed with CRRT now IHD.  #  Anemia: Hemoglobin and hematocrit stable. Monitor hemoglobin.  # Hypokalemia: Sodium level improved.  #Liver cirrhosis: Hepatitis viral infection negative.  May be due to right heart failure.  #Hypotension: Continue midodrine 15 mg 3 times daily.  Monitor blood pressure and UF as tolerated with HD.    Subjective: Seen and examined in ICU.  Tolerated intermittent HD yesterday however required albumin and midodrine for hypotension.  Urine output recorded around 300 cc.  CVP 17 and still has peripheral edema.  Plan for another session of dialysis to optimize volume.  Patient denies nausea, vomiting, chest pain, shortness of breath. Objective Vital signs in last 24 hours: Vitals:   08/12/21 0500 08/12/21 0600 08/12/21 0700 08/12/21  0751  BP: (!) 80/65 (!) 77/59 92/67   Pulse: 60 62 81   Resp: (!) 32 (!) 41 20   Temp:    98.1 F (36.7 C)  TempSrc:    Oral  SpO2: 98% 98% 100%   Weight: 89.3 kg     Height:       Weight change: 0.086 kg  Intake/Output Summary (Last 24 hours) at 08/12/2021 0755 Last data filed at 08/12/2021 0700 Gross per 24 hour  Intake 1298 ml  Output 2301 ml  Net -1003 ml        Labs: Basic Metabolic Panel: Recent Labs  Lab 08/11/21 0400 08/11/21 1849 08/12/21 0500  NA 126* 131* 129*  K 4.5 3.9 4.0  CL 94* 95* 92*  CO2 24 23 26   GLUCOSE 82 104* 78  BUN 32* 25* 35*  CREATININE 2.30* 1.94* 2.48*  CALCIUM 9.2 9.3 9.3  PHOS 3.1 2.2* 3.2    Liver Function Tests: Recent Labs  Lab 08/11/21 0400 08/11/21 1849 08/12/21 0500  ALBUMIN 3.4* 3.9 3.8    No results for input(s): LIPASE, AMYLASE in the last 168 hours. No results for input(s): AMMONIA in the last 168 hours. CBC: Recent Labs  Lab 08/08/21 0415 08/09/21 0408 08/10/21 0506 08/11/21 0400 08/12/21 0729  WBC 9.7 9.3 11.5* 11.2* 9.1  HGB 10.8* 10.5* 10.8* 10.4* 10.5*  HCT 33.8* 33.7* 33.8* 32.2* 32.1*  MCV 99.4 100.0 98.5 97.3 97.6  PLT 203 206 193 202 173    Cardiac Enzymes: No results for input(s): CKTOTAL, CKMB, CKMBINDEX, TROPONINI in the last 168 hours. CBG: No results for input(s): GLUCAP in the last 168 hours.   Iron Studies: No results for input(s):  IRON, TIBC, TRANSFERRIN, FERRITIN in the last 72 hours. Studies/Results: No results found.  Medications: Infusions:  sodium chloride     sodium chloride     albumin human      Scheduled Medications:  Chlorhexidine Gluconate Cloth  6 each Topical Daily   Chlorhexidine Gluconate Cloth  6 each Topical Q0600   cholecalciferol  1,000 Units Oral Daily   feeding supplement  237 mL Oral BID BM   heparin injection (subcutaneous)  5,000 Units Subcutaneous Q8H   midodrine  15 mg Oral TID WC   multivitamin  1 tablet Oral QHS   pantoprazole  40 mg Oral  Daily   predniSONE  20 mg Oral Q breakfast   senna-docusate  1 tablet Oral QHS   sodium chloride flush  10-40 mL Intracatheter Q12H   sodium chloride flush  3 mL Intravenous Q12H    have reviewed scheduled and prn medications.  Physical Exam: General: Able to lie flat, not in distress Heart:RRR, s1s2 nl Lungs: Clear bilateral, no wheezing or work of breathing. Abdomen:soft, Non-tender, non-distended Extremities: Lower extremity edema present, dressing applied. Dialysis Access: Temporary HD catheter, site clean  Jacaden Forbush Prasad Billyjack Trompeter 08/12/2021,7:55 AM  LOS: 21 days

## 2021-08-12 NOTE — Consult Note (Signed)
Chief Complaint: Patient was seen in consultation today for  Chief Complaint  Patient presents with   Shortness of Breath    Referring Physician(s): Dr. Carolin Sicks  Supervising Physician: Mir, Sharen Heck  Patient Status: [facility] - In-pt  History of Present Illness: Lauren Osborn is a 45 y.o. female with a medical history significant for HTN, CHF, OSA on CPAP, gout, cirrhosis, chronic kidney disease Stage IIIb and obesity. She presented to the ED 07/22/21 with shortness of breath, increased leg swelling, abdominal distention, RUQ pain and weight gain. Labs were significant for elevated BNP and creatinine levels. She was admitted for further management of acute heart failure exacerbation. She was started on lasix but developed worsening kidney function and was transferred to the ICU. A right IJ non-tunneled central venous catheter was placed by the critical care team 07/29/21 and the patient was started on CRRT. On 08/11/21 she was stable enough to stop CRRT and receive intermittent HD.   Interventional Radiology has been asked to evaluate this patient for placement of a tunneled dialysis catheter for ongoing hemodialysis treatments.     Past Medical History:  Diagnosis Date   Abnormal bilirubin test    Cholelithiasis    Chronic kidney disease, stage 3b (HCC)    Diastolic congestive heart failure (HCC)    Esophagitis    Essential hypertension    Fibroids    Gastritis    GERD (gastroesophageal reflux disease)    Gout    Liver hemangioma    Morbid obesity ([place])    Normocytic anemia    OSA (obstructive sleep apnea)    Thyroid nodule    Tubular adenoma     Past Surgical History:  Procedure Laterality Date   BIOPSY  08/03/2020   Procedure: BIOPSY;  Surgeon: Eloise Harman, DO;  [facility];  Service: Endoscopy;;  duodenal gastric   CESAREAN SECTION     COLONOSCOPY WITH PROPOFOL N/A 08/03/2020    Surgeon: Hurshel Keys K, DO; 5 mm tubular adenoma in the sigmoid  colon, nonbleeding internal hemorrhoids.  Recommended repeat in 5 years.   ESOPHAGOGASTRODUODENOSCOPY (EGD) WITH PROPOFOL N/A 08/03/2020   Surgeon: Eloise Harman, DO; LA grade C esophagitis without bleeding, gastritis with erythema and shallow ulcerations in gastric antrum biopsied (negative for H. pylori), normal examined duodenum s/p biopsy (benign).   HYSTERECTOMY ABDOMINAL WITH SALPINGECTOMY  2008   POLYPECTOMY  08/03/2020   Procedure: POLYPECTOMY;  Surgeon: Eloise Harman, DO;  Location: [facility];  Service: Endoscopy;;  colon   RIGHT HEART CATH N/A 07/26/2021   Procedure: RIGHT HEART CATH;  Surgeon: Larey Dresser, MD;  Location: [place] [facility];  Service: Cardiovascular;  Laterality: N/A;   THYROIDECTOMY Right 10/21/2018   Procedure: RIGHT HEMITHYROIDECTOMY;  Surgeon: Leta Baptist, MD;  Location: [place];  Service: ENT;  Laterality: Right;    Allergies: Oxycodone-acetaminophen  Medications: Prior to Admission medications   Medication Sig Start Date End Date Taking? Authorizing Provider  acetaminophen (TYLENOL) 500 MG tablet Take 1,000 mg by mouth every 6 (six) hours as needed for moderate pain or headache.   Yes [provider]  magnesium oxide (MAG-OX) 400 MG tablet TAKE 1 TABLET BY MOUTH ONCE DAILY. 01/17/21  Yes Satira Sark, MD  metolazone (ZAROXOLYN) 2.5 MG tablet Take 1 tablet (2.5 mg total) by mouth See admin instructions. 2.5 mg every M W F 06/28/21  Yes Barton Dubois, MD  omeprazole (PRILOSEC) 20 MG capsule Take 1 capsule (  20 mg total) by mouth daily before breakfast. 07/22/21 01/18/22 Yes Jodi Mourning, Tivis Ringer, PA-C  potassium chloride SA (KLOR-CON) 20 MEQ tablet Take 3 tablets (60 mEq total) by mouth 3 (three) times daily. Take 2 additional Tablets on the Days That you take Metolazone 07/15/21  Yes Strader, Baldwin, PA-C  spironolactone (ALDACTONE) 25 MG tablet Take 1 tablet (25 mg total) by mouth daily. 06/28/21  Yes Barton Dubois,  MD  TART CHERRY PO Take by mouth daily.   Yes [provider]  torsemide (DEMADEX) 20 MG tablet Take 4 tablets in am and 3 tablets in the evening. 06/28/21  Yes Barton Dubois, MD     Family History  Problem Relation Age of Onset   Breast cancer Mother    Other Paternal Grandfather        house fire   Other Maternal Grandmother        house fire   Congestive Heart Failure Maternal Grandfather    Colon cancer Father 76   Diverticulitis Brother    Diverticulitis Sister    Colon polyps Sister 77   Diabetes Daughter        borderline    Social History   Socioeconomic History   Marital status: Single    Spouse name: Not on file   Number of children: Not on file   Years of education: Not on file   Highest education level: Not on file  Occupational History   Not on file  Tobacco Use   Smoking status: Former    Packs/day: 0.50    Years: 20.00    Pack years: 10.00    Types: Cigarettes    Quit date: 2017    Years since quitting: 5.9   Smokeless tobacco: Never  Vaping Use   Vaping Use: Never used  Substance and Sexual Activity   Alcohol use: Not Currently   Drug use: Never   Sexual activity: Yes    Comment: hyst  Other Topics Concern   Not on file  Social History Narrative   Not on file   Social Determinants of Health   Financial Resource Strain: Low Risk    Difficulty of Paying Living Expenses: Not very hard  Food Insecurity: No Food Insecurity   Worried About Charity fundraiser in the Last Year: Never true   [place] in the Last Year: Never true  Transportation Needs: No Transportation Needs   Lack of Transportation (Medical): No   Lack of Transportation (Non-Medical): No  Physical Activity: Not on file  Stress: Not on file  Social Connections: Not on file    Review of Systems: A 12 point ROS discussed and pertinent positives are indicated in the HPI above.  All other systems are negative.  Review of Systems  Constitutional:  Negative for  appetite change and fatigue.  Respiratory:  Negative for cough and shortness of breath.   Cardiovascular:  Positive for leg swelling.  Gastrointestinal:  Negative for abdominal pain, diarrhea, nausea and vomiting.  Genitourinary:  Positive for decreased urine volume.  Neurological:  Negative for dizziness and headaches.   Vital Signs: BP 104/66   Pulse 87   Temp 98 F (36.7 C) (Oral)   Resp (!) 25   Ht 5\' 3"  (1.6 m)   Wt 191 lb 5.8 oz (86.8 kg)   SpO2 96%   BMI 33.90 kg/m   Physical Exam Constitutional:      General: She is not in acute distress. HENT:  Mouth/Throat:     Mouth: Mucous membranes are moist.     Pharynx: Oropharynx is clear.  Cardiovascular:     Rate and Rhythm: Normal rate and regular rhythm.     Comments: Right IJ trialysis catheter. Dressing is clean and dry.  Pulmonary:     Effort: Pulmonary effort is normal.     Breath sounds: Normal breath sounds.  Abdominal:     General: Bowel sounds are normal.     Palpations: Abdomen is soft.     Tenderness: There is no abdominal tenderness.  Musculoskeletal:     Right lower leg: Edema present.     Left lower leg: Edema present.     Comments: Bilateral lower extremity swelling; una boots in place.   Skin:    General: Skin is warm and dry.  Neurological:     Mental Status: She is alert and oriented to person, place, and time.    Imaging: NM Pulmonary Perfusion  Result Date: 07/25/2021 CLINICAL DATA:  Short of breath, history of tobacco abuse EXAM: NUCLEAR MEDICINE PERFUSION LUNG SCAN TECHNIQUE: Perfusion images were obtained in multiple projections after intravenous injection of radiopharmaceutical. Ventilation scans intentionally deferred if perfusion scan and chest x-ray adequate for interpretation during COVID 19 epidemic. RADIOPHARMACEUTICALS:  4.4 mCi Tc-45m MAA IV COMPARISON:  07/25/2021 FINDINGS: Planar images of the chest are obtained during the perfusion examination. There are no wedge-shaped  perfusion defects to suggest pulmonary embolus. Symmetrical distribution of radiotracer seen bilaterally. IMPRESSION: 1. No evidence of pulmonary embolus. Electronically Signed   By: Randa Ngo M.D.   On: 07/25/2021 15:50   US Abdomen Complete  Result Date: 07/22/2021 CLINICAL DATA:  Right upper quadrant pain history of cirrhosis and ascites EXAM: ABDOMEN ULTRASOUND COMPLETE COMPARISON:  12/17/2020 MRI FINDINGS: Gallbladder: Echogenic shadowing gallstone measures up to 3.3 cm by ultrasound. Wall thickness measures 5.8 mm. No pericholecystic fluid. Nonspecific Murphy's sign elicited over the gallbladder. Common bile duct: Diameter: 4.7 mm Liver: Increased echogenicity with surface nodularity compatible with hepatic cirrhosis. Right hepatic dome solid hyperechoic lesion measures 3.6 x 4.1 cm, previously demonstrated to be hemangioma by MRI. Portal vein is patent on color Doppler imaging with normal direction of blood flow towards the liver. IVC: No abnormality visualized. Pancreas: Unable to visualize because of body habitus Spleen: Size and appearance within normal limits. Right Kidney: Length: 8.9 cm. Echogenicity within normal limits. No mass or hydronephrosis visualized. Left Kidney: Length: 9.6 cm. Echogenicity within normal limits. No mass or hydronephrosis. Possible small 7 mm shadowing calculus in the midpole region. Abdominal aorta: No aneurysm visualized. Other findings: Moderate volume of abdominopelvic ascites. IMPRESSION: Cholelithiasis with some degree of mild gallbladder wall thickening. No pericholecystic fluid. Nonspecific sonographic Murphy's sign elicited. Correlate clinically for cholecystitis. Hepatic cirrhosis and moderate volume of abdominopelvic ascites. 4.1 cm hyperechoic right hepatic dome lesion previously demonstrated be a hemangioma by MRI. Query subcentimeter nonobstructing left nephrolithiasis. Electronically Signed   By: Jerilynn Mages.  Shick M.D.   On: 07/22/2021 11:44   CARDIAC  CATHETERIZATION  Result Date: 07/26/2021 1. Markedly elevated left and right heart filling pressures. 2. Moderate pulmonary venous hypertension. 3. Preserved cardiac output.   US Venous Img Lower Unilateral Left (DVT)  Result Date: 07/23/2021 CLINICAL DATA:  Left leg swelling EXAM: LEFT LOWER EXTREMITY VENOUS DOPPLER ULTRASOUND TECHNIQUE: Gray-scale sonography with compression, as well as color and duplex ultrasound, were performed to evaluate the deep venous system(s) from the level of the common femoral vein through the popliteal and proximal calf  veins. COMPARISON:  None. FINDINGS: VENOUS Normal compressibility of the common femoral, superficial femoral, and popliteal veins, as well as the visualized calf veins. Visualized portions of profunda femoral vein and great saphenous vein unremarkable. No filling defects to suggest DVT on grayscale or color Doppler imaging. Doppler waveforms show normal direction of venous flow, normal respiratory plasticity and response to augmentation. Limited views of the contralateral common femoral vein are unremarkable. OTHER There is a heterogeneous soft tissue mass in the anterior lower leg measuring 7.9 x 5.4 x 3.5 cm. Mild internal vascularity. Limitations: Limited assessment of the calf veins due to pitting edema and body habitus. IMPRESSION: 1. Negative for DVT in the left lower extremity. Exam is somewhat limited particularly in the calf veins due to edema and body habitus. 2. Indeterminate heterogeneous soft tissue mass in the anterior lower leg measuring up to 7.9 cm. Tissue sampling may be considered. Per patient report mass has been enlarging. Electronically Signed   By: Audie Pinto M.D.   On: 07/23/2021 09:34   DG CHEST PORT 1 VIEW  Result Date: 07/29/2021 CLINICAL DATA:  45 year old female status post central line placement. EXAM: PORTABLE CHEST - 1 VIEW COMPARISON:  07/25/2021 FINDINGS: The mediastinal contours are within normal limits. No  cardiomegaly. Interval insertion right internal jugular central venous catheter with the catheter tip located in the superior aspect of the superior vena cava. The lungs are clear bilaterally without evidence of focal consolidation, pleural effusion, or pneumothorax. No acute osseous abnormality. IMPRESSION: Right IJ central line placement with the catheter tip in the superior vena cava, no complicating features. Electronically Signed   By: Ruthann Cancer M.D.   On: 07/29/2021 16:18   DG Chest Port 1 View  Result Date: 07/25/2021 CLINICAL DATA:  Dyspnea EXAM: PORTABLE CHEST 1 VIEW COMPARISON:  07/22/2021 FINDINGS: Single frontal view of the chest demonstrates an unremarkable cardiac silhouette. No airspace disease, effusion, or pneumothorax. No acute bony abnormalities. IMPRESSION: 1. Stable exam, no acute process. Electronically Signed   By: Randa Ngo M.D.   On: 07/25/2021 15:49   DG Chest Portable 1 View  Result Date: 07/22/2021 CLINICAL DATA:  Dyspnea EXAM: PORTABLE CHEST 1 VIEW COMPARISON:  06/23/2021 FINDINGS: Lungs volumes are small, but are symmetric and are clear. No pneumothorax or pleural effusion. Cardiac size within normal limits. Vascular crowding at the hila secondary to poor pulmonary insufflation. Osseous structures are age-appropriate. No acute bone abnormality. IMPRESSION: No active disease. Electronically Signed   By: Fidela Salisbury M.D.   On: 07/22/2021 22:01   ECHOCARDIOGRAM COMPLETE  Result Date: 07/23/2021    ECHOCARDIOGRAM REPORT   Patient Name:   IVORI STORR Date of Exam: 07/23/2021 Medical Rec #:  [id number]      Height:       63.0 in Accession #:    [id number]     Weight:       316.0 lb Date of Birth:  17-Apr-1976      BSA:          2.349 m Patient Age:    10 years       BP:           102/70 mmHg Patient Gender: F              HR:           91 bpm. Exam Location:  Inpatient Procedure: 2D Echo, Color Doppler and Cardiac Doppler Indications:    Q98.26 Acute diastolic  (congestive) heart failure  History:        Patient has prior history of Echocardiogram examinations, most                 recent 03/24/2021. CHF; Risk Factors:Hypertension and Sleep                 Apnea.  Sonographer:    Raquel Sarna Senior RDCS Referring Phys: [id number] OLADAPO ADEFESO IMPRESSIONS  1. Left ventricular ejection fraction, by estimation, is 60 to 65%. The left ventricle has normal function. The left ventricle has no regional wall motion abnormalities. Left ventricular diastolic parameters were normal.  2. Right ventricular systolic function is normal. The right ventricular size is normal.  3. Left atrial size was mildly dilated.  4. The mitral valve is normal in structure. No evidence of mitral valve regurgitation. No evidence of mitral stenosis.  5. The aortic valve is tricuspid. Aortic valve regurgitation is not visualized. Aortic valve sclerosis is present, with no evidence of aortic valve stenosis. FINDINGS  Left Ventricle: Left ventricular ejection fraction, by estimation, is 60 to 65%. The left ventricle has normal function. The left ventricle has no regional wall motion abnormalities. The left ventricular internal cavity size was normal in size. There is  no left ventricular hypertrophy. Left ventricular diastolic parameters were normal. Right Ventricle: The right ventricular size is normal. Right ventricular systolic function is normal. Left Atrium: Left atrial size was mildly dilated. Right Atrium: Right atrial size was normal in size. Pericardium: There is no evidence of pericardial effusion. Mitral Valve: The mitral valve is normal in structure. No evidence of mitral valve regurgitation. No evidence of mitral valve stenosis. Tricuspid Valve: The tricuspid valve is normal in structure. Tricuspid valve regurgitation is mild . No evidence of tricuspid stenosis. Aortic Valve: The aortic valve is tricuspid. Aortic valve regurgitation is not visualized. Aortic valve sclerosis is present, with no evidence  of aortic valve stenosis. Pulmonic Valve: The pulmonic valve was grossly normal. Pulmonic valve regurgitation is trivial. No evidence of pulmonic stenosis. Aorta: The aortic root is normal in size and structure. Venous: The inferior vena cava was not well visualized. IAS/Shunts: The interatrial septum was not well visualized.  LEFT VENTRICLE PLAX 2D LVIDd:         4.10 cm   Diastology LVIDs:         2.80 cm   LV e' medial:    12.20 cm/s LV PW:         0.70 cm   LV E/e' medial:  7.1 LV IVS:        0.60 cm   LV e' lateral:   12.90 cm/s LVOT diam:     1.80 cm   LV E/e' lateral: 6.7 LV SV:         46 LV SV Index:   20 LVOT Area:     2.54 cm  RIGHT VENTRICLE RV S prime:     10.20 cm/s TAPSE (M-mode): 1.6 cm LEFT ATRIUM             Index        RIGHT ATRIUM           Index LA diam:        4.90 cm 2.09 cm/m   RA Area:     22.30 cm LA Vol (A2C):   54.2 ml 23.08 ml/m  RA Volume:   70.70 ml  30.10 ml/m LA Vol (A4C):   75.9 ml 32.32 ml/m LA Biplane Vol: 65.0 ml 27.68 ml/m  AORTIC VALVE LVOT Vmax:   103.00 cm/s LVOT Vmean:  71.600 cm/s LVOT VTI:    0.181 m  AORTA Ao Root diam: 2.80 cm Ao Asc diam:  3.00 cm MITRAL VALVE               TRICUSPID VALVE MV Area (PHT): 3.30 cm    TR Peak grad:   20.4 mmHg MV Decel Time: 230 msec    TR Vmax:        226.00 cm/s MV E velocity: 86.50 cm/s MV A velocity: 55.70 cm/s  SHUNTS MV E/A ratio:  1.55        Systemic VTI:  0.18 m                            Systemic Diam: 1.80 cm Kirk Ruths MD Electronically signed by Kirk Ruths MD Signature Date/Time: 07/23/2021/2:47:50 PM    Final     Labs:  CBC: Recent Labs    08/09/21 0408 08/10/21 0506 08/11/21 0400 08/12/21 0729  WBC 9.3 11.5* 11.2* 9.1  HGB 10.5* 10.8* 10.4* 10.5*  HCT 33.7* 33.8* 32.2* 32.1*  PLT 206 193 202 173    COAGS: Recent Labs    09/16/20 0215 07/22/21 1755 08/03/21 1524 08/08/21 0415 08/09/21 0408 08/10/21 0506 08/11/21 0400  INR 1.3* 1.2  --   --   --   --   --   APTT 21*  --    < > 84*  74* 75* 34   < > = values in this interval not displayed.    BMP: Recent Labs    08/10/21 1607 08/11/21 0400 08/11/21 1849 08/12/21 0500  NA 129* 126* 131* 129*  K 4.7 4.5 3.9 4.0  CL 95* 94* 95* 92*  CO2 24 24 23 26   GLUCOSE 115* 82 104* 78  BUN 23* 32* 25* 35*  CALCIUM 9.3 9.2 9.3 9.3  CREATININE 1.93* 2.30* 1.94* 2.48*  GFRNONAA 32* 26* 32* 24*    LIVER FUNCTION TESTS: Recent Labs    07/22/21 1144 07/22/21 1148 07/22/21 1153 07/22/21 1755 07/23/21 0551 07/29/21 1600 08/10/21 1607 08/11/21 0400 08/11/21 1849 08/12/21 0500  BILITOT 2.7*  --  2.7* 2.4* 2.5*  --   --   --   --   --   AST 38  --  38 40 34  --   --   --   --   --   ALT 18  --  18 18 16   --   --   --   --   --   ALKPHOS 84  --  89 88 79  --   --   --   --   --   PROT 7.8  --  7.9 7.7 7.2  --   --   --   --   --   ALBUMIN 4.1   < > 4.1 4.0 3.7   < > 3.9 3.4* 3.9 3.8   < > = values in this interval not displayed.    TUMOR MARKERS: No results for input(s): AFPTM, CEA, CA199, CHROMGRNA in the last 8760 hours.  Assessment and Plan:  Acute kidney injury on chronic kidney disease stage IIIb; hemodialysis: Lauren Osborn, 45 year old female, is tentatively scheduled for placement of a tunneled dialysis cathter on 08/15/21. Per Cardiology team, Ok to remove right IJ trialysis catheter when placing tunneled line. They are using the pigtail for checking CVPs but will plan  to discontinue this after tunneled line is placed.   Risks and benefits discussed with the patient including, but not limited to bleeding, infection, vascular injury, pneumothorax which may require chest tube placement, air embolism or even death  All of the patient's questions were answered, patient is agreeable to proceed. She will be NPO at midnight the day of the procedure.   Consent signed and in chart.   Thank you for this interesting consult.  I greatly enjoyed meeting Lauren Osborn and look forward to participating in their care.   A copy of this report was sent to the requesting provider on this date.  Electronically Signed: Soyla Dryer, AGACNP-BC [phone] 08/12/2021, 12:52 PM   I spent a total of 20 Minutes    in face to face in clinical consultation, greater than 50% of which was counseling/coordinating care for temp to tunneled dialysis catheter placement

## 2021-08-12 NOTE — Progress Notes (Signed)
Physical Therapy Treatment Patient Details Name: Lauren Osborn MRN: [id number] DOB: 13-Jul-1976 Today's Date: 08/12/2021   History of Present Illness 45 yo admitted 11/18 with CHF, AKI on CKD, cirrhosis. 11/22 Rt heart cath. 11/25 CRRT started. PMhx: HTn, CHF, OSA, gout, obesity    PT Comments    The pt presents with good energy and motivation after session of intermittent HD earlier this morning. The pt was able to complete full loop on unit (357ft) with minG for safety but no UE support or AD. The pt does present with increased lateral sway due to LLE pain from gout. The pt was able to complete without standing rest break, but was educated in energy conservation and may still benefit from use of rollator for stability and energy conservation at d/c. Pt continues to make great progress after CRRT, will continue to assess if pt can progress to no follow up needs after d/c, but at this time continue to recommend HHPT.     Recommendations for follow up therapy are one component of a multi-disciplinary discharge planning process, led by the attending physician.  Recommendations may be updated based on patient status, additional functional criteria and insurance authorization.  Follow Up Recommendations  Home health PT     Assistance Recommended at Discharge Intermittent Supervision/Assistance  Equipment Recommendations  Rollator (4 wheels)    Recommendations for Other Services       Precautions / Restrictions Precautions Precautions: Fall;Other (comment) Precaution Comments: LE edema Restrictions Weight Bearing Restrictions: No     Mobility  Bed Mobility Overal bed mobility: Needs Assistance             General bed mobility comments: in recliner    Transfers Overall transfer level: Needs assistance Equipment used: Rolling walker (2 wheels) Transfers: Sit to/from Stand Sit to Stand: Supervision           General transfer comment: supervision for safety, pt able to  complete without UE support    Ambulation/Gait Ambulation/Gait assistance: Min guard Gait Distance (Feet): 370 Feet Assistive device: None Gait Pattern/deviations: Step-through pattern;Step-to pattern;Decreased stride length;Antalgic Gait velocity: 0.5 m/s Gait velocity interpretation: 1.31 - 2.62 ft/sec, indicative of limited community ambulator   General Gait Details: pt with significant lateral sway and antalgic pattern due to pain in LLE. no overt LOB. no need for standing rest break. HR 111bpm     Balance Overall balance assessment: Needs assistance Sitting-balance support: No upper extremity supported Sitting balance-Leahy Scale: Good Sitting balance - Comments: EOB with UE support   Standing balance support: No upper extremity supported Standing balance-Leahy Scale: Good Standing balance comment: fair static standing with unsupported peri care, ambulated with no UE support with increased lateral sway                            Cognition Arousal/Alertness: Awake/alert Behavior During Therapy: WFL for tasks assessed/performed Overall Cognitive Status: Within Functional Limits for tasks assessed                                          Exercises      General Comments General comments (skin integrity, edema, etc.): VSS on RA      Pertinent Vitals/Pain Pain Assessment: No/denies pain     PT Goals (current goals can now be found in the care plan section) Acute Rehab PT Goals  Patient Stated Goal: be able to return home and to work PT Goal Formulation: With patient Time For Goal Achievement: 08/16/21 Potential to Achieve Goals: Good Progress towards PT goals: Progressing toward goals    Frequency    Min 3X/week      PT Plan Current plan remains appropriate       AM-PAC PT "6 Clicks" Mobility   Outcome Measure  Help needed turning from your back to your side while in a flat bed without using bedrails?: A Little Help needed  moving from lying on your back to sitting on the side of a flat bed without using bedrails?: A Little Help needed moving to and from a bed to a chair (including a wheelchair)?: A Little Help needed standing up from a chair using your arms (e.g., wheelchair or bedside chair)?: A Little Help needed to walk in hospital room?: A Little Help needed climbing 3-5 steps with a railing? : A Little 6 Click Score: 18    End of Session Equipment Utilized During Treatment: Gait belt Activity Tolerance: Patient tolerated treatment well Patient left: in chair;with call bell/phone within reach Nurse Communication: Mobility status PT Visit Diagnosis: Other abnormalities of gait and mobility (R26.89);Difficulty in walking, not elsewhere classified (R26.2);Muscle weakness (generalized) (M62.81)     Time: 1400-1410 PT Time Calculation (min) (ACUTE ONLY): 10 min  Charges:  $Gait Training: 8-22 mins                     West Carbo, PT, DPT   Acute Rehabilitation Department Pager #: [phone]   Sandra Cockayne 08/12/2021, 3:06 PM

## 2021-08-12 NOTE — Progress Notes (Signed)
Aware pt to likely need out-pt HD at d/c. Will f/u on Monday to determine time to start out-pt HD referral. Will assist as needed.  Melven Sartorius Renal Navigator [phone]

## 2021-08-12 NOTE — Progress Notes (Addendum)
Patient ID: Lauren Osborn, female   DOB: [birth year], 45 y.o.   MRN: [id number]     Advanced Heart Failure Rounding Note  PCP-Cardiologist: Rozann Lesches, MD   Subjective:    [place] 11/22 w/ markedly elevated left and right filling pressure, mod pulmonary venous HTN and preserved CO.  She did not respond to maximal diuretics + milrinone 0.125, CVVH started.   Off CRRT 12/07.   Started iHD 12/08. 2L volume removed. Weight up 5 lb  Feeling good. Ambulated halls yesterday. No dyspnea. Denies dizziness.  Co-ox 68% off milrinone  Na 131>129>126>131>129  BP predominately 40N-02V systolic, occasionally dipped to 70s yesterday. Received albumin during HD yesterday.   [place] 07/26/21  Right Heart Pressures RHC Procedural Findings: Hemodynamics (mmHg) RA mean 35 RV 57/28, 35 PA 58/29, mean 46 PCWP mean 42  Oxygen saturations: PA 70% AO 99%  Cardiac Output (Fick) 7.04  Cardiac Index (Fick) 3.01 PVR < 1 WU PAPI < 1    Objective:   Weight Range: 89.3 kg Body mass index is 34.87 kg/m.   Vital Signs:   Temp:  [97.5 F (36.4 C)-98.3 F (36.8 C)] 98.1 F (36.7 C) (12/08 1900) Pulse Rate:  [60-115] 62 (12/09 0600) Resp:  [14-44] 41 (12/09 0600) BP: (73-107)/(52-82) 77/59 (12/09 0600) SpO2:  [95 %-100 %] 98 % (12/09 0600) Weight:  [87 kg-89.3 kg] 89.3 kg (12/09 0500) Last BM Date: 08/11/21  Weight change: Filed Weights   08/11/21 1225 08/11/21 1545 08/12/21 0500  Weight: 88.9 kg 87 kg 89.3 kg    Intake/Output:   Intake/Output Summary (Last 24 hours) at 08/12/2021 0658 Last data filed at 08/12/2021 0000 Gross per 24 hour  Intake 1298 ml  Output 2301 ml  Net -1003 ml      Physical Exam  CVP 17 General:  Well appearing. No resp difficulty HEENT: normal Neck: supple. JVP to jaw. R IJ HD cath. Carotids 2+ bilat; no bruits. No lymphadenopathy or thryomegaly appreciated. Cor: PMI nondisplaced. Regular rate & rhythm. No rubs, gallops or murmurs. Lungs: clear Abdomen:  soft, nontender, nondistended. No hepatosplenomegaly. No bruits or masses. Good bowel sounds. Extremities: no cyanosis, clubbing, rash, 2+ edema, UNNA boots on Neuro: alert & orientedx3, cranial nerves grossly intact. moves all 4 extremities w/o difficulty. Affect pleasant      Telemetry   NSR 60s-70s yesterday, 90s-100s since early am   Labs    CBC Recent Labs    08/10/21 0506 08/11/21 0400  WBC 11.5* 11.2*  HGB 10.8* 10.4*  HCT 33.8* 32.2*  MCV 98.5 97.3  PLT 193 253   Basic Metabolic Panel Recent Labs    08/11/21 0400 08/11/21 1849 08/12/21 0500  NA 126* 131* 129*  K 4.5 3.9 4.0  CL 94* 95* 92*  CO2 24 23 26   GLUCOSE 82 104* 78  BUN 32* 25* 35*  CREATININE 2.30* 1.94* 2.48*  CALCIUM 9.2 9.3 9.3  MG 2.4  --  2.2  PHOS 3.1 2.2* 3.2   Liver Function Tests Recent Labs    08/11/21 1849 08/12/21 0500  ALBUMIN 3.9 3.8   No results for input(s): LIPASE, AMYLASE in the last 72 hours. Cardiac Enzymes No results for input(s): CKTOTAL, CKMB, CKMBINDEX, TROPONINI in the last 72 hours.  BNP: BNP (last 3 results) Recent Labs    03/04/21 1222 06/23/21 1807 07/22/21 1755  BNP 543.0* 357.0* 510.0*    ProBNP (last 3 results) No results for input(s): PROBNP in the last 8760 hours.   D-Dimer No  results for input(s): DDIMER in the last 72 hours. Hemoglobin A1C No results for input(s): HGBA1C in the last 72 hours. Fasting Lipid Panel No results for input(s): CHOL, HDL, LDLCALC, TRIG, CHOLHDL, LDLDIRECT in the last 72 hours. Thyroid Function Tests No results for input(s): TSH, T4TOTAL, T3FREE, THYROIDAB in the last 72 hours.  Invalid input(s): FREET3  Other results:   Imaging    No results found.   Medications:     Scheduled Medications:  Chlorhexidine Gluconate Cloth  6 each Topical Daily   Chlorhexidine Gluconate Cloth  6 each Topical Q0600   cholecalciferol  1,000 Units Oral Daily   feeding supplement  237 mL Oral BID BM   heparin  injection (subcutaneous)  5,000 Units Subcutaneous Q8H   midodrine  15 mg Oral TID WC   multivitamin  1 tablet Oral QHS   pantoprazole  40 mg Oral Daily   predniSONE  20 mg Oral Q breakfast   senna-docusate  1 tablet Oral QHS   sodium chloride flush  10-40 mL Intracatheter Q12H   sodium chloride flush  3 mL Intravenous Q12H    Infusions:  sodium chloride     sodium chloride      PRN Medications: sodium chloride, sodium chloride, alteplase, camphor-menthol, heparin, HYDROmorphone, lidocaine (PF), lidocaine-prilocaine, loratadine, pentafluoroprop-tetrafluoroeth, polyethylene glycol, sodium chloride flush    Assessment/Plan   1. Acute on chronic diastolic CHF: With prominent RV dysfunction.  Echo from 11/22 reviewed, EF 60-65%, normal RV systolic function with mild dilation, D-shaped septum consistent with RV pressure/volume overload. Patient is markedly volume overloaded with anasarca.  Unfortunately, patient developed worsening AKI with attempted diuresis. RHC  w/ markedly elevated left and right filling pressure, mod pulmonary venous HTN and preserved CO. PAPi low at 0.82. Minimal response to maximal diuretics and milrinone 0.125, CVVH started.   -Now off milrinone with stable co-ox.  -CVVHD stopped 12/07 after filter clotted. Received iHD yesterday, planning for HD again today. 2L volume removed. Volume up. CVP 17. Weight up 5 lb.  - Continue midodrine 15 mg tid.  2. AKI on CKD stage IIIb: Baseline creatinine 1.9-2.3, bumped to 3.68 in the setting of severe volume overload.  Suspect cardiorenal syndrome. RHC w/ normal CO but low PAPI suggesting RV dysfunction.  She is anuric.  - Nephrology following.  - First HD session yesterday. SBP predominately 80s-90s. SBP Dipped into 70s yesterday. Received albumin during HD. Continue midodrine with iHD - Suspect she is going to need long term HD.  3. Cirrhosis: Uncertain etiology, labs negative for viral hepatitis.  Not a heavy drinker.  May be  due to NAFLD or RV failure.  4. OSA: Continue CPAP at night.  5. Hyponatremia: Managed with CVVHD - Na 132.131>129>126>131>129 6. Morbid obesity - Body mass index is 34.87 kg/m. 7. Left ankle pain: Patient has history of gout. Symptoms improved with prednisone burst for gout, completed. - Had recurrent pain 12/06. Prednisone restarted at 20 mg daily X 5 days -Would benefit from allopurinol 3 X weekly once on iHD, discussed dosing with pharmD. Can start when symptoms resolved.   Length of Stay: [address] [place], PA-C  08/12/2021, 6:58 AM  Advanced Heart Failure Team Pager [phone] (M-F; 7a - 5p)  Please contact [facility] for night-coverage after hours (5p -7a ) and weekends on [email or web address]  Patient seen with PA, agree with the above note.   She is doing well today, walked in halls already this morning.  Breathing much better. Co-ox 68%.  CVP 17 and SBP in 90s on midodrine 15 mg tid.  She was able to tolerate iHD yesterday.   General: NAD Neck: JVP 16 cm, no thyromegaly or thyroid nodule.  Lungs: Clear to auscultation bilaterally with normal respiratory effort. CV: Nondisplaced PMI.  Heart regular S1/S2, no S3/S4, no murmur.  1+ chronic ankle edema.  Abdomen: Soft, nontender, no hepatosplenomegaly, no distention.  Skin: Intact without lesions or rashes.  Neurologic: Alert and oriented x 3.  Psych: Normal affect. Extremities: No clubbing or cyanosis.  HEENT: Normal.   Patient remains volume overloaded despite marked weight loss.  She will require iHD again today and possibly tomorrow.  Will continue midodrine 15 mg tid to maintain BP, Unna boots in place.   Plan for tunneled catheter on Monday.   Loralie Champagne 08/12/2021 7:45 AM

## 2021-08-12 NOTE — Progress Notes (Signed)
Occupational Therapy Treatment Patient Details Name: Lauren Osborn MRN: [id number] DOB: 04/11/1976 Today's Date: 08/12/2021   History of present illness 45 yo admitted 11/18 with CHF, AKI on CKD, cirrhosis. 11/22 Rt heart cath. 11/25 CRRT started. PMhx: HTn, CHF, OSA, gout, obesity   OT comments  Pt with minimal pain in L ankle from gout, but did not interfere with mobility. Pt demonstrating ability to dress with set up and stand at sink with supervision for grooming. Nursing is routinely walking with pt to bathroom with pt managing pericare.    Recommendations for follow up therapy are one component of a multi-disciplinary discharge planning process, led by the attending physician.  Recommendations may be updated based on patient status, additional functional criteria and insurance authorization.    Follow Up Recommendations  No OT follow up    Assistance Recommended at Discharge Intermittent Supervision/Assistance  Equipment Recommendations  None recommended by OT    Recommendations for Other Services      Precautions / Restrictions Precautions Precautions: Fall;Other (comment) Precaution Comments: LE edema Restrictions Weight Bearing Restrictions: No       Mobility Bed Mobility Overal bed mobility: Needs Assistance             General bed mobility comments: in recliner    Transfers Overall transfer level: Needs assistance Equipment used: None Transfers: Sit to/from Stand Sit to Stand: Supervision           General transfer comment: supervision for safety, pt able to complete without UE support     Balance Overall balance assessment: Needs assistance Sitting-balance support: No upper extremity supported Sitting balance-Leahy Scale: Good Sitting balance - Comments: no LOB donning socks   Standing balance support: No upper extremity supported Standing balance-Leahy Scale: Good Standing balance comment: fair standing at sink for oral care, ambulated with  no UE support with increased lateral sway due to gout in L ankle                           ADL either performed or assessed with clinical judgement   ADL Overall ADL's : Needs assistance/impaired     Grooming: Oral care;Standing;Supervision/safety           Upper Body Dressing : Set up;Sitting Upper Body Dressing Details (indicate cue type and reason): back side gown Lower Body Dressing: Set up;Sitting/lateral leans Lower Body Dressing Details (indicate cue type and reason): donned socks without difficulty             Functional mobility during ADLs: Supervision/safety General ADL Comments: educated in monitoring for fatigue and rest breaks as needed    Extremity/Trunk Assessment              Vision       Perception     Praxis      Cognition Arousal/Alertness: Awake/alert Behavior During Therapy: WFL for tasks assessed/performed Overall Cognitive Status: Within Functional Limits for tasks assessed                                            Exercises     Shoulder Instructions       General Comments VSS on RA    Pertinent Vitals/ Pain       Pain Assessment: Faces Faces Pain Scale: Hurts a little bit Pain Location: L LE Pain Descriptors / Indicators:  Guarding Pain Intervention(s): Monitored during session;Repositioned  Home Living Family/patient expects to be discharged to:: Private residence                                        Prior Functioning/Environment              Frequency  Min 2X/week        Progress Toward Goals  OT Goals(current goals can now be found in the care plan section)  Progress towards OT goals: Progressing toward goals  Acute Rehab OT Goals OT Goal Formulation: With patient Time For Goal Achievement: 08/17/21 Potential to Achieve Goals: Good  Plan Discharge plan remains appropriate    Co-evaluation                 AM-PAC OT "6 Clicks" Daily Activity      Outcome Measure   Help from another person eating meals?: None Help from another person taking care of personal grooming?: A Little Help from another person toileting, which includes using toliet, bedpan, or urinal?: A Little Help from another person bathing (including washing, rinsing, drying)?: A Little Help from another person to put on and taking off regular upper body clothing?: None Help from another person to put on and taking off regular lower body clothing?: A Little 6 Click Score: 20    End of Session Equipment Utilized During Treatment: Gait belt  OT Visit Diagnosis: Unsteadiness on feet (R26.81);Other abnormalities of gait and mobility (R26.89);Muscle weakness (generalized) (M62.81)   Activity Tolerance Patient tolerated treatment well   Patient Left in chair;with call bell/phone within reach   Nurse Communication          Time: 0109-3235 OT Time Calculation (min): 9 min  Charges: OT General Charges $OT Visit: 1 Visit OT Treatments $Self Care/Home Management : 8-22 mins  Nestor Lewandowsky, OTR/L Acute Rehabilitation Services Pager: [phone] Office: [phone]   Malka So 08/12/2021, 3:37 PM

## 2021-08-13 DIAGNOSIS — I5033 Acute on chronic diastolic (congestive) heart failure: Secondary | ICD-10-CM | POA: Diagnosis not present

## 2021-08-13 LAB — RENAL FUNCTION PANEL
Albumin: 3.8 g/dL (ref 3.5–5.0)
Anion gap: 10 (ref 5–15)
BUN: 34 mg/dL — ABNORMAL HIGH (ref 6–20)
CO2: 26 mmol/L (ref 22–32)
Calcium: 9 mg/dL (ref 8.9–10.3)
Chloride: 93 mmol/L — ABNORMAL LOW (ref 98–111)
Creatinine, Ser: 2.37 mg/dL — ABNORMAL HIGH (ref 0.44–1.00)
GFR, Estimated: 25 mL/min — ABNORMAL LOW (ref >60)
Glucose, Bld: 82 mg/dL (ref 70–99)
Phosphorus: 2.8 mg/dL (ref 2.5–4.6)
Potassium: 3.6 mmol/L (ref 3.5–5.1)
Sodium: 129 mmol/L — ABNORMAL LOW (ref 135–145)

## 2021-08-13 LAB — COOXEMETRY PANEL
Carboxyhemoglobin: 1.3 % (ref 0.5–1.5)
Methemoglobin: 0.9 % (ref 0.0–1.5)
O2 Saturation: 57 %
Total hemoglobin: 10.9 g/dL — ABNORMAL LOW (ref 12.0–16.0)

## 2021-08-13 MED ORDER — HEPARIN SODIUM (PORCINE) 1000 UNIT/ML IJ SOLN
INTRAMUSCULAR | Status: AC
  Filled 2021-08-13: qty 4

## 2021-08-13 MED ORDER — CHLORHEXIDINE GLUCONATE CLOTH 2 % EX PADS
6.0000 | MEDICATED_PAD | Freq: Every day | CUTANEOUS | Status: DC
  Administered 2021-08-13 – 2021-08-16 (×3): 6 via TOPICAL

## 2021-08-13 NOTE — Progress Notes (Signed)
Patient ID: Lauren Osborn, female   DOB: [birth year], 45 y.o.   MRN: [id number]     Advanced Heart Failure Rounding Note  PCP-Cardiologist: Rozann Lesches, MD   Subjective:    [place] 11/22 w/ markedly elevated left and right filling pressure, mod pulmonary venous HTN and preserved CO.  She did not respond to maximal diuretics + milrinone 0.125, CVVH started.   Off CRRT 12/07, had had iHD on 12/8 and 12/9.  Weight down about 3 lbs from yesterday.  SBP 80-90s on midodrine, tolerates HD w/o lightheadedness. CVP still 19.   Feeling good. Ambulated halls yesterday. No dyspnea. Denies dizziness.  Co-ox 57% off milrinone  RHC 07/26/21  Right Heart Pressures RHC Procedural Findings: Hemodynamics (mmHg) RA mean 35 RV 57/28, 35 PA 58/29, mean 46 PCWP mean 42  Oxygen saturations: PA 70% AO 99%  Cardiac Output (Fick) 7.04  Cardiac Index (Fick) 3.01 PVR < 1 WU PAPI < 1    Objective:   Weight Range: 87.9 kg Body mass index is 34.33 kg/m.   Vital Signs:   Temp:  [97.9 F (36.6 C)-98.6 F (37 C)] 98 F (36.7 C) (12/10 0445) Pulse Rate:  [64-112] 68 (12/10 0500) Resp:  [16-56] 26 (12/10 0500) BP: (72-108)/(51-95) 85/64 (12/10 0500) SpO2:  [93 %-100 %] 97 % (12/10 0500) Weight:  [86.8 kg-89.3 kg] 87.9 kg (12/10 0500) Last BM Date: 08/12/21  Weight change: Filed Weights   08/12/21 0800 08/12/21 1200 08/13/21 0500  Weight: 89.3 kg 86.8 kg 87.9 kg    Intake/Output:   Intake/Output Summary (Last 24 hours) at 08/13/2021 0758 Last data filed at 08/12/2021 2138 Gross per 24 hour  Intake 860 ml  Output 40 ml  Net 820 ml      Physical Exam  CVP 19 General: NAD Neck: JVP 16, no thyromegaly or thyroid nodule.  Lungs: Clear to auscultation bilaterally with normal respiratory effort. CV: Nondisplaced PMI.  Heart regular S1/S2, no S3/S4, no murmur.  2+ edema to knees.  Abdomen: Soft, nontender, no hepatosplenomegaly, no distention.  Skin: Intact without lesions or rashes.   Neurologic: Alert and oriented x 3.  Psych: Normal affect. Extremities: No clubbing or cyanosis.  HEENT: Normal.     Telemetry   NSR 70s (personally reviewed)   Labs    CBC Recent Labs    08/11/21 0400 08/12/21 0729  WBC 11.2* 9.1  HGB 10.4* 10.5*  HCT 32.2* 32.1*  MCV 97.3 97.6  PLT 202 235   Basic Metabolic Panel Recent Labs    08/11/21 0400 08/11/21 1849 08/12/21 0500 08/13/21 0323  NA 126*   < > 129* 129*  K 4.5   < > 4.0 3.6  CL 94*   < > 92* 93*  CO2 24   < > 26 26  GLUCOSE 82   < > 78 82  BUN 32*   < > 35* 34*  CREATININE 2.30*   < > 2.48* 2.37*  CALCIUM 9.2   < > 9.3 9.0  MG 2.4  --  2.2  --   PHOS 3.1   < > 3.2 2.8   < > = values in this interval not displayed.   Liver Function Tests Recent Labs    08/12/21 0500 08/13/21 0323  ALBUMIN 3.8 3.8   No results for input(s): LIPASE, AMYLASE in the last 72 hours. Cardiac Enzymes No results for input(s): CKTOTAL, CKMB, CKMBINDEX, TROPONINI in the last 72 hours.  BNP: BNP (last 3 results) Recent Labs  03/04/21 1222 06/23/21 1807 07/22/21 1755  BNP 543.0* 357.0* 510.0*    ProBNP (last 3 results) No results for input(s): PROBNP in the last 8760 hours.   D-Dimer No results for input(s): DDIMER in the last 72 hours. Hemoglobin A1C No results for input(s): HGBA1C in the last 72 hours. Fasting Lipid Panel No results for input(s): CHOL, HDL, LDLCALC, TRIG, CHOLHDL, LDLDIRECT in the last 72 hours. Thyroid Function Tests No results for input(s): TSH, T4TOTAL, T3FREE, THYROIDAB in the last 72 hours.  Invalid input(s): FREET3  Other results:   Imaging    No results found.   Medications:     Scheduled Medications:  Chlorhexidine Gluconate Cloth  6 each Topical Daily   Chlorhexidine Gluconate Cloth  6 each Topical Q0600   cholecalciferol  1,000 Units Oral Daily   feeding supplement  237 mL Oral BID BM   heparin injection (subcutaneous)  5,000 Units Subcutaneous Q8H   midodrine   15 mg Oral TID WC   multivitamin  1 tablet Oral QHS   pantoprazole  40 mg Oral Daily   predniSONE  20 mg Oral Q breakfast   senna-docusate  1 tablet Oral QHS   sodium chloride flush  10-40 mL Intracatheter Q12H   sodium chloride flush  3 mL Intravenous Q12H    Infusions:  sodium chloride     sodium chloride     albumin human 25 g (08/12/21 0815)    PRN Medications: sodium chloride, sodium chloride, albumin human, alteplase, camphor-menthol, heparin, HYDROmorphone, lidocaine (PF), lidocaine-prilocaine, loratadine, pentafluoroprop-tetrafluoroeth, polyethylene glycol, sodium chloride flush    Assessment/Plan   1. Acute on chronic diastolic CHF: With prominent RV dysfunction.  Echo from 11/22 reviewed, EF 60-65%, normal RV systolic function with mild dilation, D-shaped septum consistent with RV pressure/volume overload. Patient is markedly volume overloaded with anasarca.  Unfortunately, patient developed worsening AKI with attempted diuresis. RHC  w/ markedly elevated left and right filling pressure, mod pulmonary venous HTN and preserved CO. PAPi low at 0.82. Minimal response to maximal diuretics and milrinone 0.125, CVVH started. Now off milrinone with stable co-ox.  CVVHD stopped 12/07 after filter clotted.  Probably need CVVH a bit longer, still with volume overload (CVP 19 today) and has been getting daily iHD (BP low but tolerates with midodrine).   - Continue midodrine 15 mg tid.  - With CVP still high, would recommend HD again today.  2. AKI on CKD stage IIIb: Baseline creatinine 1.9-2.3, bumped to 3.68 in the setting of severe volume overload.  Suspect cardiorenal syndrome. RHC w/ normal CO but low PAPI suggesting RV dysfunction.  She is anuric. Initially on CVVH, now on iHD.  CVP still high. BP runs low but tolerating iHD with midodrine.  - CVP still high, would recommend HD again today.  - Suspect she is going to need long term HD, plan for HD catheter Monday.  3. Cirrhosis:  Uncertain etiology, labs negative for viral hepatitis.  Not a heavy drinker.  May be due to NAFLD or RV failure.  4. OSA: Continue CPAP at night.  5. Hyponatremia: Managed with CVVHD 6. Morbid obesity - Body mass index is 34.33 kg/m. 7. Left ankle pain: Patient has history of gout. Symptoms improved with prednisone burst for gout, completed. - Had recurrent pain 12/06. Prednisone restarted at 20 mg daily X 5 days - Would benefit from allopurinol 3 X weekly once on iHD, discussed dosing with pharmD. Can start when symptoms resolved.   Length of Stay: 22  Loralie Champagne, MD  08/13/2021, 7:58 AM  Advanced Heart Failure Team Pager [phone] (M-F; 7a - 5p)  Please contact [facility] for night-coverage after hours (5p -7a ) and weekends on [email or web address]

## 2021-08-13 NOTE — Progress Notes (Signed)
[facility] NEPHROLOGY PROGRESS NOTE  Assessment/ Plan:  # Acute kidney Injury on chronic kidney disease stage IIIb (baseline creatinine 1.9-2.3): Appears hemodynamically mediated in the setting of acute exacerbation of diastolic heart failure with worsening from ongoing diuresis.  CRRT stopped on 12/8 and receiving intermittent HD.  HD yesterday with around 3 L ultrafiltration, she became hypotensive required albumin, midodrine.  No sign of renal recovery and remains fluid overload and CVP around 19.  Plan for another dialysis today.  IR was already consulted to convert temporary catheter to tunneled HD catheter.  # Hyponatremia: Secondary to CHF exacerbation/impaired free water deficit and inappropriate ADH activation-now managed with dialysis..  #  Acute exacerbation of diastolic heart failure: Refractory to diuretics and inotropes.  VQ scan negative for PE and right heart catheterization shows markedly elevated left and right heart filling pressures with moderate pulmonary venous hypertension and preserved cardiac output.  Echo with EF of 60 to 65%.  Volume managed with CRRT now IHD.  #  Anemia: Hemoglobin and hematocrit stable. Monitor hemoglobin.  # Hypokalemia: Sodium level improved.  #Liver cirrhosis: Hepatitis viral infection negative.  May be due to right heart failure.  #Hypotension: Continue midodrine 15 mg 3 times daily.  Monitor blood pressure and UF as tolerated with HD.    Subjective: Seen and examined in ICU.  Tolerated dialysis yesterday but develops intradialytic hypotension.  No urine output.  Denies nausea, vomiting, chest pain, shortness of breath. Objective Vital signs in last 24 hours: Vitals:   08/13/21 0400 08/13/21 0445 08/13/21 0500 08/13/21 0850  BP: (!) 74/51 (!) 88/62 (!) 85/64   Pulse: 65 66 68   Resp: (!) 37 (!) 47 (!) 26   Temp:  98 F (36.7 C)  97.6 F (36.4 C)  TempSrc:  Oral  Oral  SpO2: 97% 97% 97%   Weight:   87.9 kg   Height:        Weight change: 0.4 kg  Intake/Output Summary (Last 24 hours) at 08/13/2021 0906 Last data filed at 08/12/2021 2138 Gross per 24 hour  Intake 860 ml  Output 40 ml  Net 820 ml        Labs: Basic Metabolic Panel: Recent Labs  Lab 08/11/21 1849 08/12/21 0500 08/13/21 0323  NA 131* 129* 129*  K 3.9 4.0 3.6  CL 95* 92* 93*  CO2 23 26 26   GLUCOSE 104* 78 82  BUN 25* 35* 34*  CREATININE 1.94* 2.48* 2.37*  CALCIUM 9.3 9.3 9.0  PHOS 2.2* 3.2 2.8    Liver Function Tests: Recent Labs  Lab 08/11/21 1849 08/12/21 0500 08/13/21 0323  ALBUMIN 3.9 3.8 3.8    No results for input(s): LIPASE, AMYLASE in the last 168 hours. No results for input(s): AMMONIA in the last 168 hours. CBC: Recent Labs  Lab 08/08/21 0415 08/09/21 0408 08/10/21 0506 08/11/21 0400 08/12/21 0729  WBC 9.7 9.3 11.5* 11.2* 9.1  HGB 10.8* 10.5* 10.8* 10.4* 10.5*  HCT 33.8* 33.7* 33.8* 32.2* 32.1*  MCV 99.4 100.0 98.5 97.3 97.6  PLT 203 206 193 202 173    Cardiac Enzymes: No results for input(s): CKTOTAL, CKMB, CKMBINDEX, TROPONINI in the last 168 hours. CBG: No results for input(s): GLUCAP in the last 168 hours.   Iron Studies: No results for input(s): IRON, TIBC, TRANSFERRIN, FERRITIN in the last 72 hours. Studies/Results: No results found.  Medications: Infusions:  sodium chloride     sodium chloride     albumin human 25 g (08/12/21  0815)    Scheduled Medications:  Chlorhexidine Gluconate Cloth  6 each Topical Daily   Chlorhexidine Gluconate Cloth  6 each Topical Q0600   cholecalciferol  1,000 Units Oral Daily   feeding supplement  237 mL Oral BID BM   heparin injection (subcutaneous)  5,000 Units Subcutaneous Q8H   midodrine  15 mg Oral TID WC   multivitamin  1 tablet Oral QHS   pantoprazole  40 mg Oral Daily   predniSONE  20 mg Oral Q breakfast   senna-docusate  1 tablet Oral QHS   sodium chloride flush  10-40 mL Intracatheter Q12H   sodium chloride flush  3 mL Intravenous  Q12H    have reviewed scheduled and prn medications.  Physical Exam: General: Sitting on chair comfortable, not in distress Heart:RRR, s1s2 nl Lungs: Clear bilateral, no wheezing or work of breathing. Abdomen:soft, Non-tender, non-distended Extremities: Lower extremity edema present, dressing applied. Dialysis Access: Temporary HD catheter, site clean  Zorian Gunderman Prasad Lyam Provencio 08/13/2021,9:06 AM  LOS: 22 days

## 2021-08-14 DIAGNOSIS — I5033 Acute on chronic diastolic (congestive) heart failure: Secondary | ICD-10-CM | POA: Diagnosis not present

## 2021-08-14 DIAGNOSIS — N179 Acute kidney failure, unspecified: Secondary | ICD-10-CM | POA: Diagnosis not present

## 2021-08-14 LAB — RENAL FUNCTION PANEL
Albumin: 3.9 g/dL (ref 3.5–5.0)
Anion gap: 13 (ref 5–15)
BUN: 25 mg/dL — ABNORMAL HIGH (ref 6–20)
CO2: 24 mmol/L (ref 22–32)
Calcium: 9.3 mg/dL (ref 8.9–10.3)
Chloride: 93 mmol/L — ABNORMAL LOW (ref 98–111)
Creatinine, Ser: 2.22 mg/dL — ABNORMAL HIGH (ref 0.44–1.00)
GFR, Estimated: 27 mL/min — ABNORMAL LOW (ref >60)
Glucose, Bld: 84 mg/dL (ref 70–99)
Phosphorus: 2.5 mg/dL (ref 2.5–4.6)
Potassium: 3.7 mmol/L (ref 3.5–5.1)
Sodium: 130 mmol/L — ABNORMAL LOW (ref 135–145)

## 2021-08-14 LAB — COOXEMETRY PANEL
Carboxyhemoglobin: 1.4 % (ref 0.5–1.5)
Methemoglobin: 0.8 % (ref 0.0–1.5)
O2 Saturation: 64.2 %
Total hemoglobin: 10.7 g/dL — ABNORMAL LOW (ref 12.0–16.0)

## 2021-08-14 MED ORDER — CHLORHEXIDINE GLUCONATE CLOTH 2 % EX PADS
6.0000 | MEDICATED_PAD | Freq: Every day | CUTANEOUS | Status: DC
  Administered 2021-08-14 – 2021-08-17 (×4): 6 via TOPICAL

## 2021-08-14 NOTE — Progress Notes (Signed)
Patient ID: Lauren Osborn, female   DOB: 1975/09/25, 45 y.o.   MRN: [id number]     Advanced Heart Failure Rounding Note  PCP-Cardiologist: Lauren Lesches, MD   Subjective:    Lauren Osborn 11/22 w/ markedly elevated left and right filling pressure, mod pulmonary venous HTN and preserved CO.  She did not respond to maximal diuretics + milrinone 0.125, CVVH started.   Off CRRT 12/07, had had iHD on 12/8 and 12/9.    Had iHD yesterday with 2.5 L off. Weight unchanged (she is down 123 pounds from peak) CVP still 20-23    Resting comfortably. No SOB, orthopnea or PND. SBP 80-90s on midodrine.    [place] 07/26/21  Right Heart Pressures RHC Procedural Findings: Hemodynamics (mmHg) RA mean 35 RV 57/28, 35 PA 58/29, mean 46 PCWP mean 42  Oxygen saturations: PA 70% AO 99%  Cardiac Output (Fick) 7.04  Cardiac Index (Fick) 3.01 PVR < 1 WU PAPI < 1    Objective:   Weight Range: 87.6 kg Body mass index is 34.21 kg/m.   Vital Signs:   Temp:  [97.4 F (36.3 C)-98 F (36.7 C)] 97.8 F (36.6 C) (12/11 0752) Pulse Rate:  [63-92] 66 (12/11 1000) Resp:  [17-60] 38 (12/11 1000) BP: (77-125)/(52-74) 80/60 (12/11 1000) SpO2:  [94 %-100 %] 95 % (12/11 1000) Weight:  [87.6 kg] 87.6 kg (12/11 0600) Last BM Date: 08/12/21  Weight change: Filed Weights   08/12/21 1200 08/13/21 0500 08/14/21 0600  Weight: 86.8 kg 87.9 kg 87.6 kg    Intake/Output:   Intake/Output Summary (Last 24 hours) at 08/14/2021 1029 Last data filed at 08/14/2021 0800 Gross per 24 hour  Intake 777 ml  Output 2583 ml  Net -1806 ml       Physical Exam   General:  Lying flat in bed  No resp difficulty HEENT: normal Neck: supple. JVP to jaw. Carotids 2+ bilat; no bruits. No lymphadenopathy or thryomegaly appreciated. Cor: PMI nondisplaced. Regular rate & rhythm. No rubs, gallops or murmurs. Lungs: clear Abdomen: soft, nontender, nondistended. No hepatosplenomegaly. No bruits or masses. Good bowel  sounds. Extremities: no cyanosis, clubbing, rash, 2+ edema Neuro: alert & orientedx3, cranial nerves grossly intact. moves all 4 extremities w/o difficulty. Affect pleasant    Telemetry   NSR 60-70s (personally reviewed)   Labs    CBC Recent Labs    08/12/21 0729  WBC 9.1  HGB 10.5*  HCT 32.1*  MCV 97.6  PLT 734    Basic Metabolic Panel Recent Labs    08/12/21 0500 08/13/21 0323 08/14/21 0423  NA 129* 129* 130*  K 4.0 3.6 3.7  CL 92* 93* 93*  CO2 26 26 24   GLUCOSE 78 82 84  BUN 35* 34* 25*  CREATININE 2.48* 2.37* 2.22*  CALCIUM 9.3 9.0 9.3  MG 2.2  --   --   PHOS 3.2 2.8 2.5    Liver Function Tests Recent Labs    08/13/21 0323 08/14/21 0423  ALBUMIN 3.8 3.9    No results for input(s): LIPASE, AMYLASE in the last 72 hours. Cardiac Enzymes No results for input(s): CKTOTAL, CKMB, CKMBINDEX, TROPONINI in the last 72 hours.  BNP: BNP (last 3 results) Recent Labs    03/04/21 1222 06/23/21 1807 07/22/21 1755  BNP 543.0* 357.0* 510.0*     ProBNP (last 3 results) No results for input(s): PROBNP in the last 8760 hours.   D-Dimer No results for input(s): DDIMER in the last 72 hours. Hemoglobin A1C No  results for input(s): HGBA1C in the last 72 hours. Fasting Lipid Panel No results for input(s): CHOL, HDL, LDLCALC, TRIG, CHOLHDL, LDLDIRECT in the last 72 hours. Thyroid Function Tests No results for input(s): TSH, T4TOTAL, T3FREE, THYROIDAB in the last 72 hours.  Invalid input(s): FREET3  Other results:   Imaging    No results found.   Medications:     Scheduled Medications:  Chlorhexidine Gluconate Cloth  6 each Topical Daily   Chlorhexidine Gluconate Cloth  6 each Topical Q0600   Chlorhexidine Gluconate Cloth  6 each Topical Q0600   cholecalciferol  1,000 Units Oral Daily   feeding supplement  237 mL Oral BID BM   heparin injection (subcutaneous)  5,000 Units Subcutaneous Q8H   midodrine  15 mg Oral TID WC   multivitamin  1  tablet Oral QHS   pantoprazole  40 mg Oral Daily   senna-docusate  1 tablet Oral QHS   sodium chloride flush  10-40 mL Intracatheter Q12H   sodium chloride flush  3 mL Intravenous Q12H    Infusions:  sodium chloride     sodium chloride     albumin human 25 g (08/12/21 0815)    PRN Medications: sodium chloride, sodium chloride, albumin human, alteplase, camphor-menthol, heparin, HYDROmorphone, lidocaine (PF), lidocaine-prilocaine, loratadine, pentafluoroprop-tetrafluoroeth, polyethylene glycol, sodium chloride flush    Assessment/Plan   1. Acute on chronic diastolic CHF: With prominent RV dysfunction.  Echo from 11/22 reviewed, EF 60-65%, normal RV systolic function with mild dilation, D-shaped septum consistent with RV pressure/volume overload. Patient is markedly volume overloaded with anasarca.  Unfortunately, patient developed worsening AKI with attempted diuresis. RHC  w/ markedly elevated left and right filling pressure, mod pulmonary venous HTN and preserved CO. PAPi low at 0.82. Minimal response to maximal diuretics and milrinone 0.125, CVVH started. Now off milrinone with stable co-ox.  CVVHD stopped 12/07 after filter clotted.  Probably need CVVH a bit longer, still with volume overload (CVP 20-23 today) and has been getting daily iHD (BP low but tolerates with midodrine).   - Continue midodrine 15 mg tid.  - Continue volume removal with HD. Not sure how low we can get her CVP given her RV dysfunction but need to continue to push as tolerated 2. AKI on CKD stage IIIb: Baseline creatinine 1.9-2.3, bumped to 3.68 in the setting of severe volume overload.  Suspect cardiorenal syndrome. RHC w/ normal CO but low PAPI suggesting RV dysfunction.  She is anuric. Initially on CVVH, now on iHD.  CVP still high. BP runs low but tolerating iHD with midodrine.  - CVP still high, continue HD - Suspect she is going to need long term HD, plan for HD catheter tomorrow 3. Cirrhosis: Uncertain  etiology, labs negative for viral hepatitis.  Not a heavy drinker.  May be due to NAFLD or RV failure.  4. OSA: Continue CPAP at night.  5. Hyponatremia: Managed with CVVHD. Na 130 today 6. Morbid obesity - Body mass index is 34.21 kg/m. 7. Left ankle pain: Patient has history of gout. Symptoms improved with prednisone burst for gout, completed. - Had recurrent pain 12/06. Prednisone restarted at 20 mg daily X 5 days - Would benefit from allopurinol 3 X weekly once on iHD, discussed dosing with pharmD. Can start when symptoms resolved.   Length of Stay: [place], MD  08/14/2021, 10:29 AM  Advanced Heart Failure Team Pager [phone] (M-F; 7a - 5p)  Please contact [facility] for night-coverage after hours (5p -7a )  and weekends on [email or web address]

## 2021-08-14 NOTE — Progress Notes (Signed)
[facility] NEPHROLOGY PROGRESS NOTE  Assessment/ Plan:  # Acute kidney Injury on chronic kidney disease stage IIIb (baseline creatinine 1.9-2.3): Appears hemodynamically mediated in the setting of acute exacerbation of diastolic heart failure with worsening from ongoing diuresis.  CRRT stopped on 12/8 and receiving intermittent HD every day since then to optimize volume.  Last HD yesterday with 2.5 L ultrafiltration.  She has intradialytic hypotension requiring midodrine and lowering dialysate temperature.  No plan for HD today and will reschedule next HD tomorrow. IR was already consulted to convert temporary catheter to tunneled HD catheter.  Renal navigator is aware of arranging outpatient HD.  # Hyponatremia: Secondary to CHF exacerbation/impaired free water deficit and inappropriate ADH activation-now managed with dialysis..  #  Acute exacerbation of diastolic heart failure: Refractory to diuretics and inotropes.  VQ scan negative for PE and right heart catheterization shows markedly elevated left and right heart filling pressures with moderate pulmonary venous hypertension and preserved cardiac output.  Echo with EF of 60 to 65%.  Volume managed with CRRT now IHD.  #  Anemia: Hemoglobin and hematocrit stable. Monitor hemoglobin.  # Hypokalemia: Sodium level improved.  #Liver cirrhosis: Hepatitis viral infection negative.  May be due to right heart failure.  #Hypotension: Continue midodrine 15 mg 3 times daily.  Monitor blood pressure and UF as tolerated with HD.    Subjective: Seen and examined in ICU.  Tolerating dialysis well although she has intradialytic hypotension.  Asymptomatic.  Remains anuric.  No nausea, vomiting, chest pain, shortness of breath. Objective Vital signs in last 24 hours: Vitals:   08/14/21 0700 08/14/21 0752 08/14/21 0800 08/14/21 0900  BP: (!) 77/52  97/73 (!) 84/73  Pulse: 67  80 72  Resp: (!) 60  19 17  Temp:  97.8 F (36.6 C)    TempSrc:   Oral    SpO2: 99%  97% 98%  Weight:      Height:       Weight change: -1.7 kg  Intake/Output Summary (Last 24 hours) at 08/14/2021 0940 Last data filed at 08/14/2021 0800 Gross per 24 hour  Intake 1013 ml  Output 2583 ml  Net -1570 ml        Labs: Basic Metabolic Panel: Recent Labs  Lab 08/12/21 0500 08/13/21 0323 08/14/21 0423  NA 129* 129* 130*  K 4.0 3.6 3.7  CL 92* 93* 93*  CO2 26 26 24   GLUCOSE 78 82 84  BUN 35* 34* 25*  CREATININE 2.48* 2.37* 2.22*  CALCIUM 9.3 9.0 9.3  PHOS 3.2 2.8 2.5    Liver Function Tests: Recent Labs  Lab 08/12/21 0500 08/13/21 0323 08/14/21 0423  ALBUMIN 3.8 3.8 3.9    No results for input(s): LIPASE, AMYLASE in the last 168 hours. No results for input(s): AMMONIA in the last 168 hours. CBC: Recent Labs  Lab 08/08/21 0415 08/09/21 0408 08/10/21 0506 08/11/21 0400 08/12/21 0729  WBC 9.7 9.3 11.5* 11.2* 9.1  HGB 10.8* 10.5* 10.8* 10.4* 10.5*  HCT 33.8* 33.7* 33.8* 32.2* 32.1*  MCV 99.4 100.0 98.5 97.3 97.6  PLT 203 206 193 202 173    Cardiac Enzymes: No results for input(s): CKTOTAL, CKMB, CKMBINDEX, TROPONINI in the last 168 hours. CBG: No results for input(s): GLUCAP in the last 168 hours.   Iron Studies: No results for input(s): IRON, TIBC, TRANSFERRIN, FERRITIN in the last 72 hours. Studies/Results: No results found.  Medications: Infusions:  sodium chloride     sodium chloride  albumin human 25 g (08/12/21 0815)    Scheduled Medications:  Chlorhexidine Gluconate Cloth  6 each Topical Daily   Chlorhexidine Gluconate Cloth  6 each Topical Q0600   cholecalciferol  1,000 Units Oral Daily   feeding supplement  237 mL Oral BID BM   heparin injection (subcutaneous)  5,000 Units Subcutaneous Q8H   midodrine  15 mg Oral TID WC   multivitamin  1 tablet Oral QHS   pantoprazole  40 mg Oral Daily   senna-docusate  1 tablet Oral QHS   sodium chloride flush  10-40 mL Intracatheter Q12H   sodium chloride  flush  3 mL Intravenous Q12H    have reviewed scheduled and prn medications.  Physical Exam: General: Pleasant female, able to lie flat, not in distress Heart:RRR, s1s2 nl Lungs: Clear bilateral, no wheezing or work of breathing. Abdomen:soft, Non-tender, non-distended Extremities: Lower extremity edema present, dressing applied. Dialysis Access: Temporary HD catheter, site clean  Creighton Longley Prasad Eyden Dobie 08/14/2021,9:40 AM  LOS: 23 days

## 2021-08-15 ENCOUNTER — Inpatient Hospital Stay (HOSPITAL_COMMUNITY): Payer: Managed Care, Other (non HMO)

## 2021-08-15 DIAGNOSIS — I5033 Acute on chronic diastolic (congestive) heart failure: Secondary | ICD-10-CM | POA: Diagnosis not present

## 2021-08-15 HISTORY — PX: IR US GUIDE VASC ACCESS RIGHT: IMG2390

## 2021-08-15 HISTORY — PX: IR FLUORO GUIDE CV LINE RIGHT: IMG2283

## 2021-08-15 LAB — COOXEMETRY PANEL
Carboxyhemoglobin: 1.2 % (ref 0.5–1.5)
Methemoglobin: 0.9 % (ref 0.0–1.5)
O2 Saturation: 40.3 %
Total hemoglobin: 10.8 g/dL — ABNORMAL LOW (ref 12.0–16.0)

## 2021-08-15 LAB — RENAL FUNCTION PANEL
Albumin: 3.8 g/dL (ref 3.5–5.0)
Anion gap: 11 (ref 5–15)
BUN: 51 mg/dL — ABNORMAL HIGH (ref 6–20)
CO2: 27 mmol/L (ref 22–32)
Calcium: 9.7 mg/dL (ref 8.9–10.3)
Chloride: 93 mmol/L — ABNORMAL LOW (ref 98–111)
Creatinine, Ser: 3.46 mg/dL — ABNORMAL HIGH (ref 0.44–1.00)
GFR, Estimated: 16 mL/min — ABNORMAL LOW (ref >60)
Glucose, Bld: 83 mg/dL (ref 70–99)
Phosphorus: 4.1 mg/dL (ref 2.5–4.6)
Potassium: 4.8 mmol/L (ref 3.5–5.1)
Sodium: 131 mmol/L — ABNORMAL LOW (ref 135–145)

## 2021-08-15 IMAGING — XA IR TUNNELED CV CATH W/O PORT/PUMP >5YO
2 series · 4 of 4 positions shown · non-contrast
Comparison: none

INDICATION: 45-year-old woman with history of renal failure presents to IR for
tunneled hemodialysis catheter placement.

[Series 1: ir tunneled cv cath w/o port/pump >5yo · 2 of 2 slices shown]
[im 1/2]
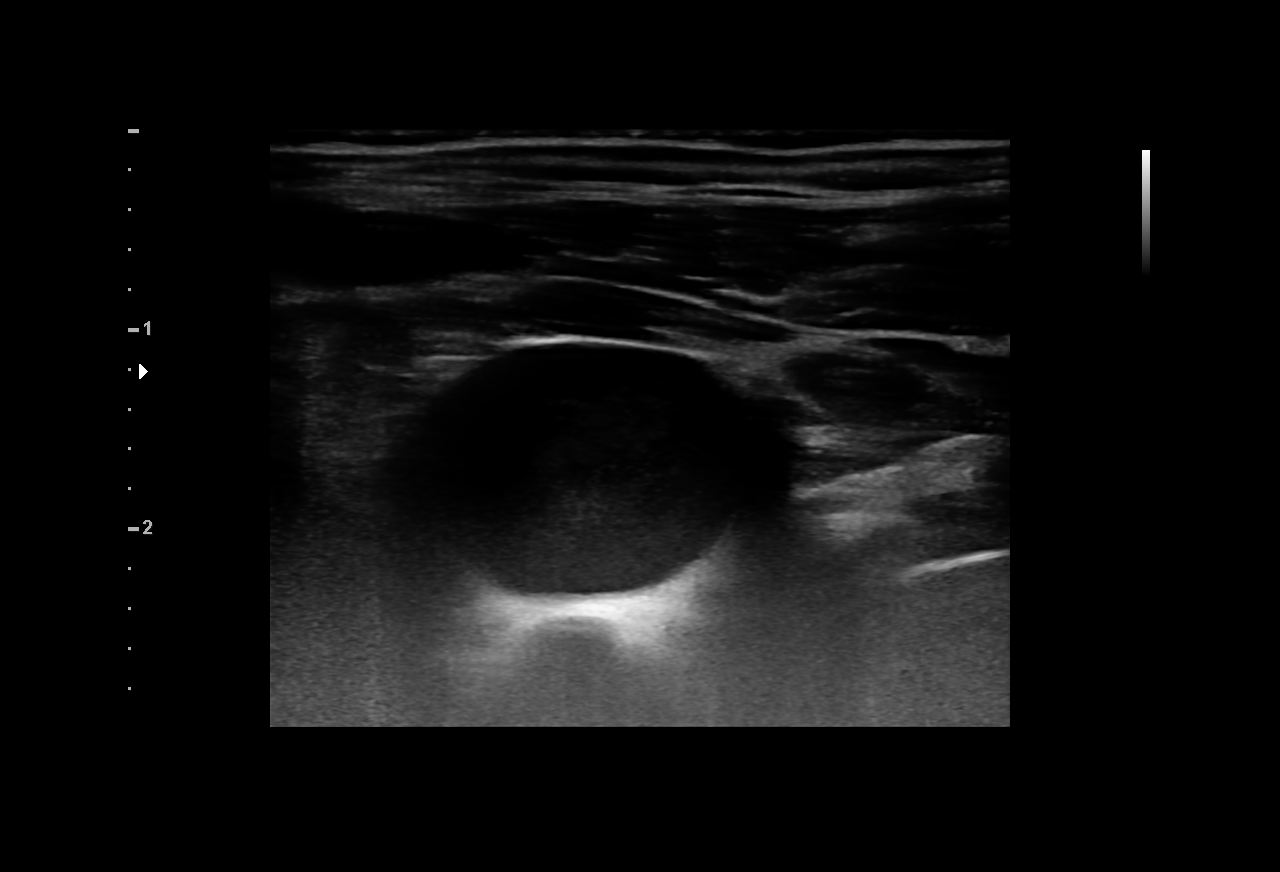
[im 2/2]
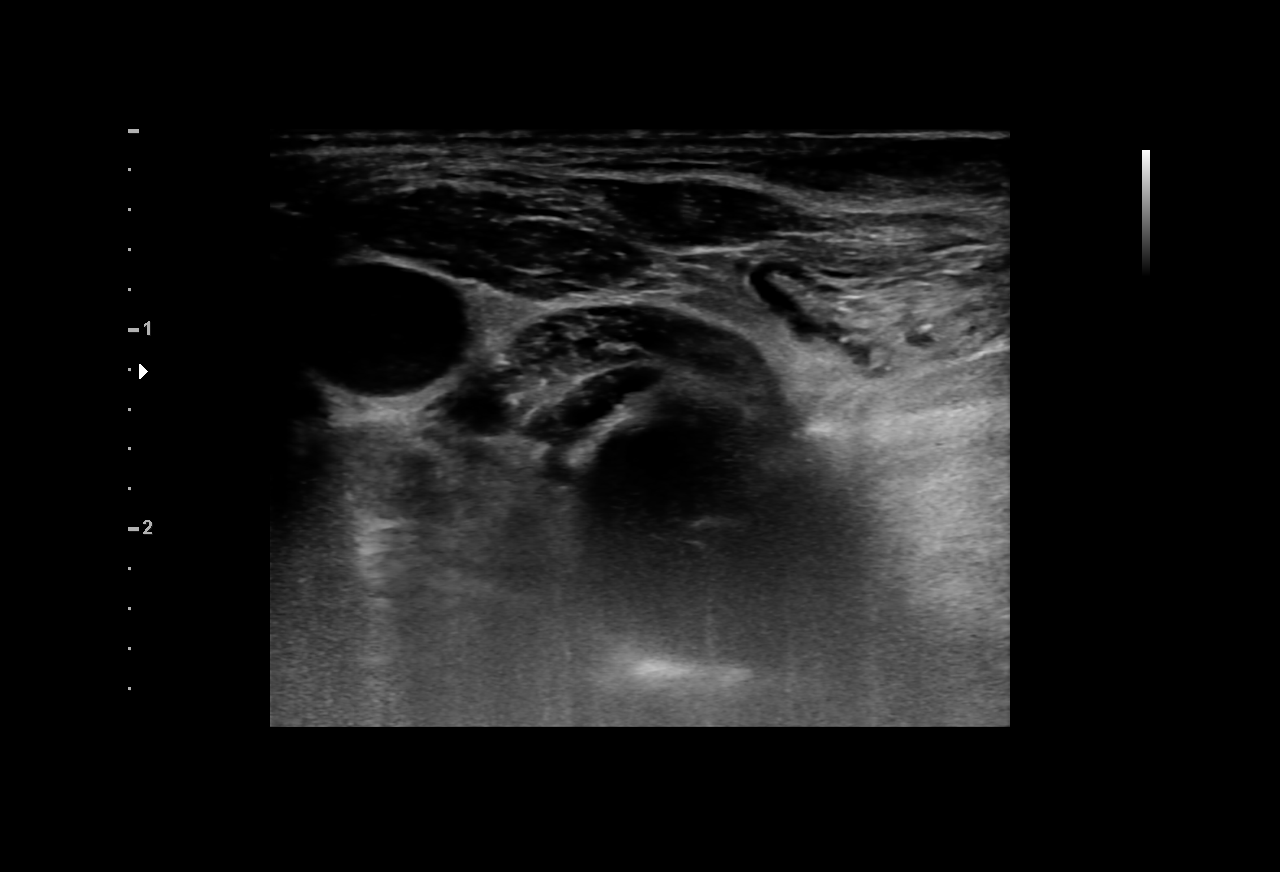

[Series 1: fl (-) angio · 2 of 2 slices shown]
[im 1/2]
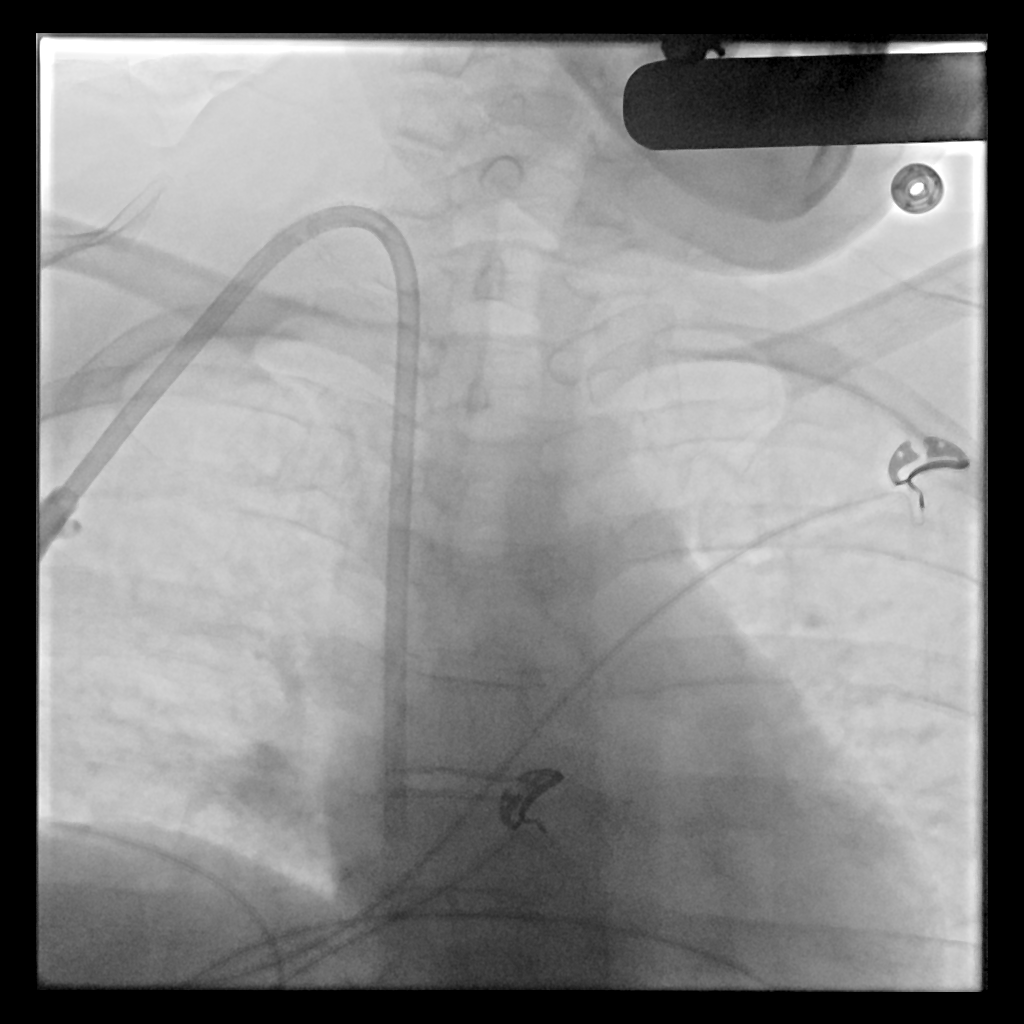
[im 2/2]
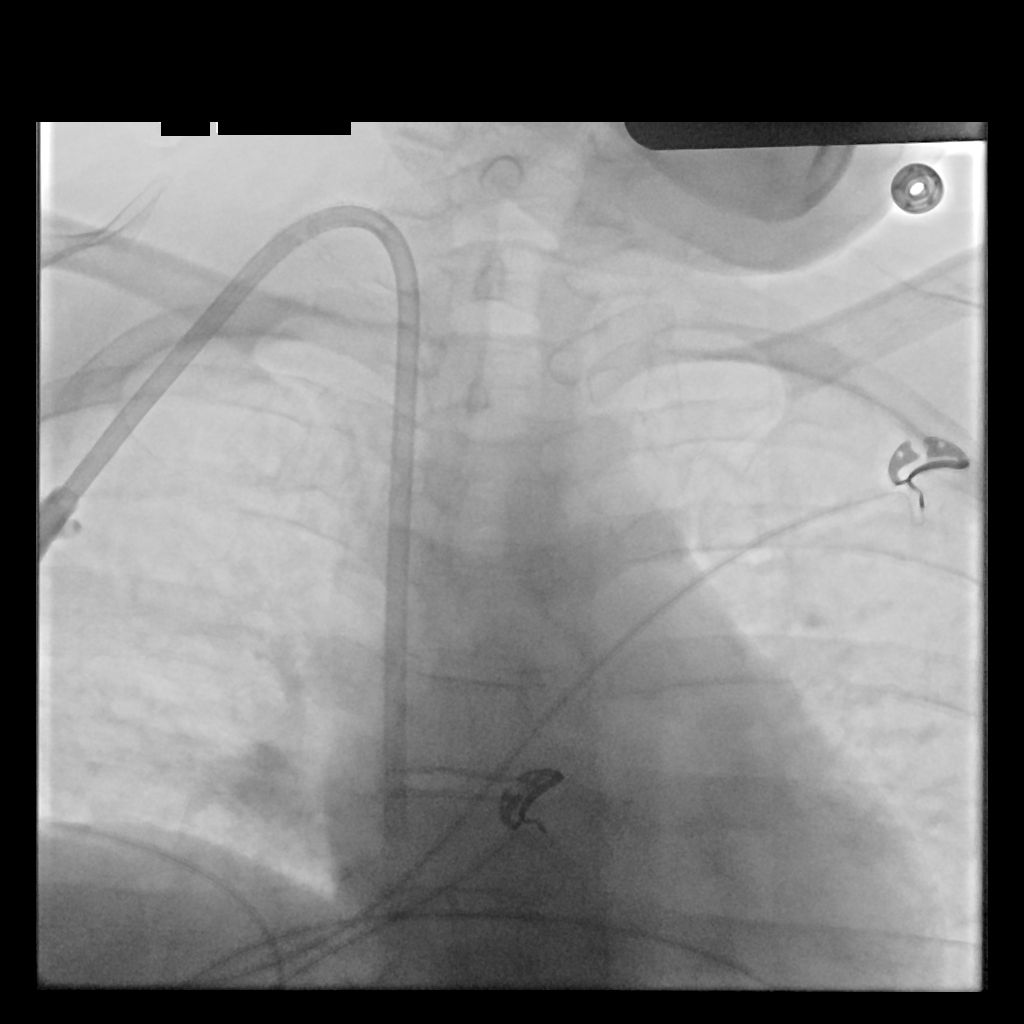

[4 of 4 positions shown; findings below may reference images not displayed]

EXAM:
TUNNELED PICC LINE WITH ULTRASOUND AND FLUOROSCOPIC GUIDANCE

MEDICATIONS:
Ancef 2 g IV. The antibiotic was given in an appropriate time
interval prior to skin puncture.

ANESTHESIA/SEDATION:
Moderate (conscious) sedation was employed during this procedure. A
total of Versed 0.5 mg and Fentanyl 25 mcg was administered
intravenously by the radiology nurse.

Total intra-service moderate Sedation Time: 11 minutes. The
patient's level of consciousness and vital signs were monitored
continuously by radiology nursing throughout the procedure under my
direct supervision.

FLUOROSCOPY TIME:  Fluoroscopy Time: 0 minutes 30 seconds (2 mGy).

COMPLICATIONS:
None immediate.



Existing temporary right IJ hemodialysis catheter was removed and
hemostasis achieved with manual compression.

The right internal jugular vein was evaluated with ultrasound and
shown to be patent. A permanent ultrasound image was obtained and
placed in the patient's medical record. Using sterile gel and a
sterile probe cover, the right internal jugular vein was entered
with a 21 ga needle during real time ultrasound guidance.

0.018 inch guidewire placed and 21 ga needle exchanged for
transitional dilator set. Utilizing fluoroscopy, 0.035 inch
guidewire advanced through the needle without difficulty.

Seriel dilation was performed and peel-away sheath was placed.
Attention then turned to the right anterior upper chest. Following
local lidocaine administration, the hemodialysis catheter was
tunneled from the chest wall to the venotomy site. The catheter was
inserted through the peel-away sheath. The tip of the catheter was
positioned within the right atrium using fluoroscopic guidance.

All lumens of the catheter aspirated and flushed well. The dialysis
lumens were locked with Heparin.

The catheter was secured to the skin with suture. The insertion site
was covered with sterile dressing.
IMPRESSION: 19 cm tunneled right IJ hemodialysis catheter ready for use.

## 2021-08-15 MED ORDER — HEPARIN SODIUM (PORCINE) 1000 UNIT/ML IJ SOLN
INTRAMUSCULAR | Status: AC
  Filled 2021-08-15: qty 10

## 2021-08-15 MED ORDER — CEFAZOLIN SODIUM-DEXTROSE 2-4 GM/100ML-% IV SOLN
INTRAVENOUS | Status: AC | PRN
  Administered 2021-08-15: 2 g via INTRAVENOUS

## 2021-08-15 MED ORDER — LIDOCAINE-EPINEPHRINE 1 %-1:100000 IJ SOLN
INTRAMUSCULAR | Status: AC
  Administered 2021-08-15: 15 mL
  Filled 2021-08-15: qty 1

## 2021-08-15 MED ORDER — MIDAZOLAM HCL 2 MG/2ML IJ SOLN
INTRAMUSCULAR | Status: AC
  Filled 2021-08-15: qty 2

## 2021-08-15 MED ORDER — FENTANYL CITRATE (PF) 100 MCG/2ML IJ SOLN
INTRAMUSCULAR | Status: AC | PRN
  Administered 2021-08-15: 25 ug via INTRAVENOUS

## 2021-08-15 MED ORDER — CEFAZOLIN SODIUM-DEXTROSE 2-4 GM/100ML-% IV SOLN
INTRAVENOUS | Status: AC
  Filled 2021-08-15: qty 100

## 2021-08-15 MED ORDER — MIDAZOLAM HCL 2 MG/2ML IJ SOLN
INTRAMUSCULAR | Status: AC | PRN
  Administered 2021-08-15: .5 mg via INTRAVENOUS

## 2021-08-15 MED ORDER — FENTANYL CITRATE (PF) 100 MCG/2ML IJ SOLN
INTRAMUSCULAR | Status: AC
  Filled 2021-08-15: qty 2

## 2021-08-15 MED ORDER — ONDANSETRON HCL 4 MG/2ML IJ SOLN
4.0000 mg | Freq: Four times a day (QID) | INTRAMUSCULAR | Status: DC | PRN
  Administered 2021-08-15: 4 mg via INTRAVENOUS
  Filled 2021-08-15: qty 2

## 2021-08-15 NOTE — Progress Notes (Signed)
[facility] ROUNDING NOTE   Subjective:   Interval History: 45 year old admitted with acute on chronic CHF. Echo 11/22  developed worsening AKI with attempted diuresis. CVVH was started in Nov and was stopped 12/8 . Patient now to receive intermittent HD. Next treatment 12/12. Baseline Cr 1.9 - 2.3   BP 91/63  P 66  T 97.9  Sats 100% room air  Na 131 K 4.8  Cl 93 Co2 27  BUN 51 Cr 3.46 Ca 9.7  phos 4.1  Alb 3.8     Objective:  Vital signs in last 24 hours:  Temp:  [97.7 F (36.5 C)-98.1 F (36.7 C)] 97.9 F (36.6 C) (12/12 0755) Pulse Rate:  [66-145] 67 (12/12 0036) Resp:  [16-42] 16 (12/12 0036) BP: (73-99)/(55-77) 79/55 (12/12 0006) SpO2:  [91 %-100 %] 96 % (12/12 0036) Weight:  [88.7 kg] 88.7 kg (12/12 0500)  Weight change: 1.1 kg Filed Weights   08/13/21 0500 08/14/21 0600 08/15/21 0500  Weight: 87.9 kg 87.6 kg 88.7 kg    Intake/Output: I/O last 3 completed shifts: In: 1219 [P.O.:1219] Out: 2552 [Urine:50; Other:2502]   Intake/Output this shift:  No intake/output data recorded.  CVS- RRR RS- CTA no wheezes  ABD- BS present soft non-distended EXT- no edema  temp dialysis catheter    Basic Metabolic Panel: Recent Labs  Lab 08/09/21 0408 08/09/21 1610 08/10/21 0506 08/10/21 1607 08/11/21 0400 08/11/21 1849 08/12/21 0500 08/13/21 0323 08/14/21 0423 08/15/21 0527  NA 132*   < > 131*   < > 126* 131* 129* 129* 130* 131*  K 4.7   < > 4.4   < > 4.5 3.9 4.0 3.6 3.7 4.8  CL 99   < > 96*   < > 94* 95* 92* 93* 93* 93*  CO2 26   < > 25   < > 24 23 26 26 24 27   GLUCOSE 83   < > 86   < > 82 104* 78 82 84 83  BUN 22*   < > 22*   < > 32* 25* 35* 34* 25* 51*  CREATININE 1.74*   < > 1.75*   < > 2.30* 1.94* 2.48* 2.37* 2.22* 3.46*  CALCIUM 9.0   < > 9.1   < > 9.2 9.3 9.3 9.0 9.3 9.7  MG 2.5*  --  2.7*  --  2.4  --  2.2  --   --   --   PHOS 2.3*   < > 2.5   < > 3.1 2.2* 3.2 2.8 2.5 4.1   < > = values in this interval not displayed.    Liver  Function Tests: Recent Labs  Lab 08/11/21 1849 08/12/21 0500 08/13/21 0323 08/14/21 0423 08/15/21 0527  ALBUMIN 3.9 3.8 3.8 3.9 3.8   No results for input(s): LIPASE, AMYLASE in the last 168 hours. No results for input(s): AMMONIA in the last 168 hours.  CBC: Recent Labs  Lab 08/09/21 0408 08/10/21 0506 08/11/21 0400 08/12/21 0729  WBC 9.3 11.5* 11.2* 9.1  HGB 10.5* 10.8* 10.4* 10.5*  HCT 33.7* 33.8* 32.2* 32.1*  MCV 100.0 98.5 97.3 97.6  PLT 206 193 202 173    Cardiac Enzymes: No results for input(s): CKTOTAL, CKMB, CKMBINDEX, TROPONINI in the last 168 hours.  BNP: Invalid input(s): POCBNP  CBG: No results for input(s): GLUCAP in the last 168 hours.  Microbiology: Results for orders placed or performed during the hospital encounter of 07/22/21  Resp Panel by RT-PCR (  Flu A&B, Covid) Nasopharyngeal Swab     Status: None   Collection Time: 07/22/21  9:54 PM   Specimen: Nasopharyngeal Swab; Nasopharyngeal(NP) swabs in vial transport medium  Result Value Ref Range Status   SARS Coronavirus 2 by RT PCR NEGATIVE NEGATIVE Final    Comment: (NOTE) SARS-CoV-2 target nucleic acids are NOT DETECTED.  The SARS-CoV-2 RNA is generally detectable in upper respiratory specimens during the acute phase of infection. The lowest concentration of SARS-CoV-2 viral copies this assay can detect is 138 copies/mL. A negative result does not preclude SARS-Cov-2 infection and should not be used as the sole basis for treatment or other patient management decisions. A negative result may occur with  improper specimen collection/handling, submission of specimen other than nasopharyngeal swab, presence of viral mutation(s) within the areas targeted by this assay, and inadequate number of viral copies(<138 copies/mL). A negative result must be combined with clinical observations, patient history, and epidemiological information. The expected result is Negative.  Fact Sheet for Patients:   EntrepreneurPulse.com.au  Fact Sheet for Healthcare Providers:  IncredibleEmployment.be  This test is no t yet approved or cleared by the [place] FDA and  has been authorized for detection and/or diagnosis of SARS-CoV-2 by FDA under an Emergency Use Authorization (EUA). This EUA will remain  in effect (meaning this test can be used) for the duration of the COVID-19 declaration under Section 564(b)(1) of the Act, 21 U.S.C.section 360bbb-3(b)(1), unless the authorization is terminated  or revoked sooner.       Influenza A by PCR NEGATIVE NEGATIVE Final   Influenza B by PCR NEGATIVE NEGATIVE Final    Comment: (NOTE) The Xpert Xpress SARS-CoV-2/FLU/RSV plus assay is intended as an aid in the diagnosis of influenza from Nasopharyngeal swab specimens and should not be used as a sole basis for treatment. Nasal washings and aspirates are unacceptable for Xpert Xpress SARS-CoV-2/FLU/RSV testing.  Fact Sheet for Patients: EntrepreneurPulse.com.au  Fact Sheet for Healthcare Providers: IncredibleEmployment.be  This test is not yet approved or cleared by the [place] FDA and has been authorized for detection and/or diagnosis of SARS-CoV-2 by FDA under an Emergency Use Authorization (EUA). This EUA will remain in effect (meaning this test can be used) for the duration of the COVID-19 declaration under Section 564(b)(1) of the Act, 21 U.S.C. section 360bbb-3(b)(1), unless the authorization is terminated or revoked.  Performed at [facility], [address] [place], [place] [address]   MRSA Next Gen by PCR, Nasal     Status: None   Collection Time: 08/08/21  8:11 PM   Specimen: Nasal Mucosa; Nasal Swab  Result Value Ref Range Status   MRSA by PCR Next Gen NOT DETECTED NOT DETECTED Final    Comment: (NOTE) The GeneXpert MRSA Assay (FDA approved for NASAL specimens only), is one component of a  comprehensive MRSA colonization surveillance program. It is not intended to diagnose MRSA infection nor to guide or monitor treatment for MRSA infections. Test performance is not FDA approved in patients less than 75 years old. Performed at [facility], [place] [address] [place],  [address]     Coagulation Studies: No results for input(s): LABPROT, INR in the last 72 hours.  Urinalysis: No results for input(s): COLORURINE, LABSPEC, PHURINE, GLUCOSEU, HGBUR, BILIRUBINUR, KETONESUR, PROTEINUR, UROBILINOGEN, NITRITE, LEUKOCYTESUR in the last 72 hours.  Invalid input(s): APPERANCEUR    Imaging: No results found.   Medications:    sodium chloride     sodium chloride  albumin human 25 g (08/12/21 0815)    Chlorhexidine Gluconate Cloth  6 each Topical Daily   Chlorhexidine Gluconate Cloth  6 each Topical Q0600   Chlorhexidine Gluconate Cloth  6 each Topical Q0600   cholecalciferol  1,000 Units Oral Daily   feeding supplement  237 mL Oral BID BM   heparin injection (subcutaneous)  5,000 Units Subcutaneous Q8H   midodrine  15 mg Oral TID WC   multivitamin  1 tablet Oral QHS   pantoprazole  40 mg Oral Daily   senna-docusate  1 tablet Oral QHS   sodium chloride flush  10-40 mL Intracatheter Q12H   sodium chloride flush  3 mL Intravenous Q12H   sodium chloride, sodium chloride, albumin human, alteplase, camphor-menthol, heparin, HYDROmorphone, lidocaine (PF), lidocaine-prilocaine, loratadine, pentafluoroprop-tetrafluoroeth, polyethylene glycol, sodium chloride flush  Assessment/ Plan:  Acute on Chronic Renal Failure  thought to be due to cardiorenal syndrome  Did receive CRRT until 12/8  now on IHD  with poor tolerability secondary to hypotension. Trying to cool dialysate although this may be problematic. Midodrine 15 mg tid  ANEMIA- stable at this point - no ESA at present  MBD- stable  HTN/VOL removed 2.5 L 12/11   interdialysis hypotension. No evidence of PE  11/21 ACCESS- Temp dialysis catheter   Acute on chronic diastolic heart failure  Liver Cirrhosis  - congestive hepatosteatosis . Does have some ascites on U/S in November. I wonder if this is contributing to her hypotension.   LOS: [place] @TODAY @9 :26 AM

## 2021-08-15 NOTE — Procedures (Signed)
Interventional Radiology Procedure Note  Procedure: Tunneled HD catheter  Indication: Renal failure  Findings: Please refer to procedural dictation for full description.  Complications: None  EBL: < 10 mL  Giara Mcgaughey, MD [phone]   

## 2021-08-15 NOTE — Progress Notes (Addendum)
Occupational Therapy Treatment and Discharge Patient Details Name: Lauren Osborn MRN: [id number] DOB: 1976/05/20 Today's Date: 08/15/2021   History of present illness 45 yo admitted 11/18 with CHF, AKI on CKD, cirrhosis. 11/22 Rt heart cath. 11/25 CRRT started. PMhx: HTn, CHF, OSA, gout, obesity   OT comments  Completing ADL independently and walked entire unit with OT following procedure for tunneled HD catheter earlier today. Reinforced energy conservation strategies, pt verbalized understanding. All goals met. No further OT needs.    Recommendations for follow up therapy are one component of a multi-disciplinary discharge planning process, led by the attending physician.  Recommendations may be updated based on patient status, additional functional criteria and insurance authorization.    Follow Up Recommendations  No OT follow up    Assistance Recommended at Discharge Intermittent Supervision/Assistance  Equipment Recommendations  None recommended by OT    Recommendations for Other Services      Precautions / Restrictions         Mobility Bed Mobility Overal bed mobility: Independent             General bed mobility comments: HOB flat    Transfers Overall transfer level: Independent Equipment used: None                     Balance Overall balance assessment: Independent                                         ADL either performed or assessed with clinical judgement   ADL Overall ADL's : Independent                         Toilet Transfer: Independent;Ambulation           Functional mobility during ADLs: Independent      Extremity/Trunk Assessment              Vision       Perception     Praxis      Cognition Arousal/Alertness: Awake/alert Behavior During Therapy: WFL for tasks assessed/performed Overall Cognitive Status: Within Functional Limits for tasks assessed                                             Exercises Other Exercises Other Exercises: walked entire unit   Shoulder Instructions       General Comments      Pertinent Vitals/ Pain       Pain Assessment: Faces Faces Pain Scale: Hurts a little bit Pain Location: L LE Pain Descriptors / Indicators: Guarding Pain Intervention(s): Monitored during session  Home Living                                          Prior Functioning/Environment              Frequency           Progress Toward Goals  OT Goals(current goals can now be found in the care plan section)  Progress towards OT goals: Goals met/education completed, patient discharged from [place] All goals met and education completed, patient discharged from  OT services    Co-evaluation                 AM-PAC OT "6 Clicks" Daily Activity     Outcome Measure   Help from another person eating meals?: None Help from another person taking care of personal grooming?: None Help from another person toileting, which includes using toliet, bedpan, or urinal?: None Help from another person bathing (including washing, rinsing, drying)?: None Help from another person to put on and taking off regular upper body clothing?: None Help from another person to put on and taking off regular lower body clothing?: None 6 Click Score: 24    End of Session    OT Visit Diagnosis: Muscle weakness (generalized) (M62.81)   Activity Tolerance Patient tolerated treatment well   Patient Left in chair;with call bell/phone within reach   Nurse Communication          Time: 1230-1301 OT Time Calculation (min): 31 min  Charges: OT General Charges $OT Visit: 1 Visit OT Treatments $Self Care/Home Management : 23-37 mins  Nestor Lewandowsky, OTR/L Acute Rehabilitation Services Pager: [phone] Office: [phone]  Malka So 08/15/2021, 1:23 PM

## 2021-08-15 NOTE — Progress Notes (Signed)
Aware of pt's current HD needs. Case discussed with nephrologist who feels pt is not ready to be clipped for out-pt HD clinic at this time. Will continue to follow and assist with out-pt HD referral when deemed appropriate.   Melven Sartorius Renal Navigator [phone]

## 2021-08-15 NOTE — Progress Notes (Signed)
Patient ID: Lauren Osborn, female   DOB: Jun 18, 1976, 45 y.o.   MRN: [id number]     Advanced Heart Failure Rounding Note  PCP-Cardiologist: Rozann Lesches, MD   Subjective:    [place] 11/22 w/ markedly elevated left and right filling pressure, mod pulmonary venous HTN and preserved CO.  She did not respond to maximal diuretics + milrinone 0.125, CVVH started.   Off CRRT 12/07, had had iHD on 12/8, 12/9, 12/10.  No HD yesterday, weight up 2 lbs with CVP 20.     Resting comfortably. No SOB, orthopnea or PND. SBP 80s-90s on midodrine.    [place] 07/26/21  Right Heart Pressures RHC Procedural Findings: Hemodynamics (mmHg) RA mean 35 RV 57/28, 35 PA 58/29, mean 46 PCWP mean 42  Oxygen saturations: PA 70% AO 99%  Cardiac Output (Fick) 7.04  Cardiac Index (Fick) 3.01 PVR < 1 WU PAPI < 1    Objective:   Weight Range: 88.7 kg Body mass index is 34.64 kg/m.   Vital Signs:   Temp:  [97.7 F (36.5 C)-98.1 F (36.7 C)] 97.7 F (36.5 C) (12/11 1611) Pulse Rate:  [66-145] 67 (12/12 0036) Resp:  [16-42] 16 (12/12 0036) BP: (73-99)/(55-77) 79/55 (12/12 0006) SpO2:  [91 %-100 %] 96 % (12/12 0036) Weight:  [88.7 kg] 88.7 kg (12/12 0500) Last BM Date: 08/12/21  Weight change: Filed Weights   08/13/21 0500 08/14/21 0600 08/15/21 0500  Weight: 87.9 kg 87.6 kg 88.7 kg    Intake/Output:   Intake/Output Summary (Last 24 hours) at 08/15/2021 0734 Last data filed at 08/14/2021 2300 Gross per 24 hour  Intake 979 ml  Output --  Net 979 ml      Physical Exam   General: NAD Neck: JVP 16 cm, no thyromegaly or thyroid nodule.  Lungs: Clear to auscultation bilaterally with normal respiratory effort. CV: Nondisplaced PMI.  Heart regular S1/S2, no S3/S4, no murmur.  1+ edema to knees.  Abdomen: Soft, nontender, no hepatosplenomegaly, no distention.  Skin: Intact without lesions or rashes.  Neurologic: Alert and oriented x 3.  Psych: Normal affect. Extremities: No clubbing or  cyanosis.  HEENT: Normal.    Telemetry   NSR 60s (personally reviewed)   Labs    CBC No results for input(s): WBC, NEUTROABS, HGB, HCT, MCV, PLT in the last 72 hours. Basic Metabolic Panel Recent Labs    08/14/21 0423 08/15/21 0527  NA 130* 131*  K 3.7 4.8  CL 93* 93*  CO2 24 27  GLUCOSE 84 83  BUN 25* 51*  CREATININE 2.22* 3.46*  CALCIUM 9.3 9.7  PHOS 2.5 4.1   Liver Function Tests Recent Labs    08/14/21 0423 08/15/21 0527  ALBUMIN 3.9 3.8   No results for input(s): LIPASE, AMYLASE in the last 72 hours. Cardiac Enzymes No results for input(s): CKTOTAL, CKMB, CKMBINDEX, TROPONINI in the last 72 hours.  BNP: BNP (last 3 results) Recent Labs    03/04/21 1222 06/23/21 1807 07/22/21 1755  BNP 543.0* 357.0* 510.0*    ProBNP (last 3 results) No results for input(s): PROBNP in the last 8760 hours.   D-Dimer No results for input(s): DDIMER in the last 72 hours. Hemoglobin A1C No results for input(s): HGBA1C in the last 72 hours. Fasting Lipid Panel No results for input(s): CHOL, HDL, LDLCALC, TRIG, CHOLHDL, LDLDIRECT in the last 72 hours. Thyroid Function Tests No results for input(s): TSH, T4TOTAL, T3FREE, THYROIDAB in the last 72 hours.  Invalid input(s): FREET3  Other results:  Imaging    No results found.   Medications:     Scheduled Medications:  Chlorhexidine Gluconate Cloth  6 each Topical Daily   Chlorhexidine Gluconate Cloth  6 each Topical Q0600   Chlorhexidine Gluconate Cloth  6 each Topical Q0600   cholecalciferol  1,000 Units Oral Daily   feeding supplement  237 mL Oral BID BM   heparin injection (subcutaneous)  5,000 Units Subcutaneous Q8H   midodrine  15 mg Oral TID WC   multivitamin  1 tablet Oral QHS   pantoprazole  40 mg Oral Daily   senna-docusate  1 tablet Oral QHS   sodium chloride flush  10-40 mL Intracatheter Q12H   sodium chloride flush  3 mL Intravenous Q12H    Infusions:  sodium chloride     sodium  chloride     albumin human 25 g (08/12/21 0815)    PRN Medications: sodium chloride, sodium chloride, albumin human, alteplase, camphor-menthol, heparin, HYDROmorphone, lidocaine (PF), lidocaine-prilocaine, loratadine, pentafluoroprop-tetrafluoroeth, polyethylene glycol, sodium chloride flush    Assessment/Plan   1. Acute on chronic diastolic CHF: With prominent RV dysfunction.  Echo from 11/22 reviewed, EF 60-65%, normal RV systolic function with mild dilation, D-shaped septum consistent with RV pressure/volume overload. Patient is markedly volume overloaded with anasarca.  Unfortunately, patient developed worsening AKI with attempted diuresis. RHC  w/ markedly elevated left and right filling pressure, mod pulmonary venous HTN and preserved CO. PAPi low at 0.82. Minimal response to maximal diuretics and milrinone 0.125, CVVH started. Now off milrinone with stable co-ox.  CVVHD stopped 12/07 after filter clotted.  CVP still elevated at 20 and has been getting iHD (BP low but tolerates with midodrine).   - Continue midodrine 15 mg tid.  - Continue volume removal with HD, needs today. Not sure how low we can get her CVP given her RV dysfunction but need to continue to push as tolerated 2. AKI on CKD stage IIIb: Baseline creatinine 1.9-2.3, bumped to 3.68 in the setting of severe volume overload.  Suspect cardiorenal syndrome. RHC w/ normal CO but low PAPI suggesting RV dysfunction.  She is anuric. Initially on CVVH, now on iHD.  CVP still high. BP runs low but tolerating iHD with midodrine.  - CVP still high, continue HD (needs today).  - Suspect she is going to need long term HD, plan for HD catheter today.  3. Cirrhosis: Uncertain etiology, labs negative for viral hepatitis.  Not a heavy drinker.  May be due to NAFLD or RV failure.  4. OSA: Continue CPAP at night.  5. Hyponatremia: Managed with CVVHD.  6. Morbid obesity - Body mass index is 34.64 kg/m. 7. Left ankle pain: Patient has history  of gout. Symptoms improved with prednisone burst for gout, completed. - Had recurrent pain 12/06. Prednisone restarted at 20 mg daily X 5 days, now complete.  - Would benefit from allopurinol 3 X weekly once on iHD, discussed dosing with pharmD. Can start when symptoms resolved.   Length of Stay: [place], MD  08/15/2021, 7:34 AM  Advanced Heart Failure Team Pager [phone] (M-F; 7a - 5p)  Please contact [facility] for night-coverage after hours (5p -7a ) and weekends on [email or web address]

## 2021-08-15 NOTE — Progress Notes (Signed)
Physical Therapy Treatment Patient Details Name: Lauren Osborn MRN: [id number] DOB: [birth year] Today's Date: 08/15/2021   History of Present Illness 45 yo admitted 11/18 with CHF, AKI on CKD, cirrhosis. 11/22 Rt heart cath. 11/25 CRRT started, stopped 12/8 for transition to intermittent HD. PMhx: HTN, CHF, OSA, gout, and obesity.    PT Comments    The pt was able to demo good continued progress with OOB mobility, gait, and completed navigation of 5 steps today to mimic home environment. The pt was able to demo independence with sit-stand transfers, even without need for DME. The pt was able to complete >700 ft hallway ambulation without need for DME or assist, therefore DME recommendations and follow-up therapies were updated to better address pt's needs. Will continue to follow to establish HEP and complete final education, but the pt is very close to meeting all PT goals and having no further acute PT needs.   Dynamic Gait Index (DGI): 22/24 (<19 indicates increased risk for falls)  The patient ambulates with a gait speed of 0.6 m/s (gait speed <1.20ms indicates increased risk of falls)    Recommendations for follow up therapy are one component of a multi-disciplinary discharge planning process, led by the attending physician.  Recommendations may be updated based on patient status, additional functional criteria and insurance authorization.  Follow Up Recommendations  Outpatient PT     Assistance Recommended at Discharge PRN  Equipment Recommendations  None recommended by PT    Recommendations for Other Services       Precautions / Restrictions Precautions Precautions: Fall;Other (comment) Precaution Comments: LE edema Restrictions Weight Bearing Restrictions: No     Mobility  Bed Mobility Overal bed mobility: Independent             General bed mobility comments: pt OOB at start and end of session    Transfers Overall transfer level: Independent Equipment  used: None Transfers: Sit to/from Stand Sit to Stand: Independent           General transfer comment: supervision provided in session, pt with no evidence of instability, needs set-up only to manage lines    Ambulation/Gait Ambulation/Gait assistance: Supervision Gait Distance (Feet): 740 Feet Assistive device: None Gait Pattern/deviations: Step-through pattern;Step-to pattern;Decreased stride length;Antalgic Gait velocity: 0.6 m/s Gait velocity interpretation: 1.31 - 2.62 ft/sec, indicative of limited community ambulator   General Gait Details: pt with slight lateral sway due to LLE pain, pt educated on progressing to heel-toe gait pattern as able. Pt with no overt LOB, slow but steady speed   Stairs Stairs: Yes Stairs assistance: Supervision Stair Management: One rail Left;Step to pattern;Forwards Number of Stairs: 5 General stair comments: favoring LLE, step to pattern but good stability      Balance Overall balance assessment: Independent                               Standardized Balance Assessment Standardized Balance Assessment : Dynamic Gait Index   Dynamic Gait Index Level Surface: Normal Change in Gait Speed: Normal Gait with Horizontal Head Turns: Normal Gait with Vertical Head Turns: Normal Gait and Pivot Turn: Normal Step Over Obstacle: Mild Impairment Step Around Obstacles: Normal Steps: Mild Impairment Total Score: 22      Cognition Arousal/Alertness: Awake/alert Behavior During Therapy: WFL for tasks assessed/performed Overall Cognitive Status: Within Functional Limits for tasks assessed  Exercises Other Exercises Other Exercises: walked entire unit    General Comments General comments (skin integrity, edema, etc.): VSS on RA      Pertinent Vitals/Pain Pain Assessment: Faces Faces Pain Scale: Hurts a little bit Pain Location: L LE Pain Descriptors / Indicators:  Guarding Pain Intervention(s): Limited activity within patient's tolerance;Monitored during session;Repositioned     PT Goals (current goals can now be found in the care plan section) Acute Rehab PT Goals Patient Stated Goal: be able to return home and to work PT Goal Formulation: With patient Time For Goal Achievement: 08/22/21 Potential to Achieve Goals: Good Progress towards PT goals: Goals met and updated - see care plan    Frequency    Min 3X/week      PT Plan Discharge plan needs to be updated;Equipment recommendations need to be updated       AM-PAC PT "6 Clicks" Mobility   Outcome Measure  Help needed turning from your back to your side while in a flat bed without using bedrails?: None Help needed moving from lying on your back to sitting on the side of a flat bed without using bedrails?: None Help needed moving to and from a bed to a chair (including a wheelchair)?: None Help needed standing up from a chair using your arms (e.g., wheelchair or bedside chair)?: None Help needed to walk in hospital room?: A Little Help needed climbing 3-5 steps with a railing? : A Little 6 Click Score: 22    End of Session Equipment Utilized During Treatment: Gait belt Activity Tolerance: Patient tolerated treatment well Patient left: in chair;with call bell/phone within reach Nurse Communication: Mobility status PT Visit Diagnosis: Other abnormalities of gait and mobility (R26.89);Difficulty in walking, not elsewhere classified (R26.2);Muscle weakness (generalized) (M62.81)     Time: 3887-1959 PT Time Calculation (min) (ACUTE ONLY): 17 min  Charges:  $Therapeutic Exercise: 8-22 mins                     [place], PT, DPT   Acute Rehabilitation Department Pager #: [phone]   Sandra Cockayne 08/15/2021, 4:44 PM

## 2021-08-16 DIAGNOSIS — I5033 Acute on chronic diastolic (congestive) heart failure: Secondary | ICD-10-CM | POA: Diagnosis not present

## 2021-08-16 LAB — RENAL FUNCTION PANEL
Albumin: 3.7 g/dL (ref 3.5–5.0)
Albumin: 3.7 g/dL (ref 3.5–5.0)
Albumin: 4 g/dL (ref 3.5–5.0)
Anion gap: 12 (ref 5–15)
Anion gap: 13 (ref 5–15)
Anion gap: 12 (ref 5–15)
BUN: 67 mg/dL — ABNORMAL HIGH (ref 6–20)
BUN: 69 mg/dL — ABNORMAL HIGH (ref 6–20)
BUN: 31 mg/dL — ABNORMAL HIGH (ref 6–20)
CO2: 22 mmol/L (ref 22–32)
CO2: 23 mmol/L (ref 22–32)
CO2: 25 mmol/L (ref 22–32)
Calcium: 9.4 mg/dL (ref 8.9–10.3)
Calcium: 9.8 mg/dL (ref 8.9–10.3)
Calcium: 9.3 mg/dL (ref 8.9–10.3)
Chloride: 92 mmol/L — ABNORMAL LOW (ref 98–111)
Chloride: 90 mmol/L — ABNORMAL LOW (ref 98–111)
Chloride: 95 mmol/L — ABNORMAL LOW (ref 98–111)
Creatinine, Ser: 4.43 mg/dL — ABNORMAL HIGH (ref 0.44–1.00)
Creatinine, Ser: 4.63 mg/dL — ABNORMAL HIGH (ref 0.44–1.00)
Creatinine, Ser: 2.72 mg/dL — ABNORMAL HIGH (ref 0.44–1.00)
GFR, Estimated: 12 mL/min — ABNORMAL LOW (ref >60)
GFR, Estimated: 11 mL/min — ABNORMAL LOW (ref >60)
GFR, Estimated: 21 mL/min — ABNORMAL LOW (ref >60)
Glucose, Bld: 89 mg/dL (ref 70–99)
Glucose, Bld: 79 mg/dL (ref 70–99)
Glucose, Bld: 124 mg/dL — ABNORMAL HIGH (ref 70–99)
Phosphorus: 4.9 mg/dL — ABNORMAL HIGH (ref 2.5–4.6)
Phosphorus: 5.1 mg/dL — ABNORMAL HIGH (ref 2.5–4.6)
Phosphorus: 2.4 mg/dL — ABNORMAL LOW (ref 2.5–4.6)
Potassium: 4.8 mmol/L (ref 3.5–5.1)
Potassium: 5 mmol/L (ref 3.5–5.1)
Potassium: 3.7 mmol/L (ref 3.5–5.1)
Sodium: 126 mmol/L — ABNORMAL LOW (ref 135–145)
Sodium: 126 mmol/L — ABNORMAL LOW (ref 135–145)
Sodium: 132 mmol/L — ABNORMAL LOW (ref 135–145)

## 2021-08-16 LAB — CBC
HCT: 32 % — ABNORMAL LOW (ref 36.0–46.0)
HCT: 32.5 % — ABNORMAL LOW (ref 36.0–46.0)
Hemoglobin: 10.4 g/dL — ABNORMAL LOW (ref 12.0–15.0)
Hemoglobin: 10.5 g/dL — ABNORMAL LOW (ref 12.0–15.0)
MCH: 31.6 pg (ref 26.0–34.0)
MCH: 31.6 pg (ref 26.0–34.0)
MCHC: 32.5 g/dL (ref 30.0–36.0)
MCHC: 32.3 g/dL (ref 30.0–36.0)
MCV: 97.3 fL (ref 80.0–100.0)
MCV: 97.9 fL (ref 80.0–100.0)
Platelets: 214 10*3/uL (ref 150–400)
Platelets: 205 10*3/uL (ref 150–400)
RBC: 3.29 MIL/uL — ABNORMAL LOW (ref 3.87–5.11)
RBC: 3.32 MIL/uL — ABNORMAL LOW (ref 3.87–5.11)
RDW: 15.5 % (ref 11.5–15.5)
RDW: 15.6 % — ABNORMAL HIGH (ref 11.5–15.5)
WBC: 8.1 10*3/uL (ref 4.0–10.5)
WBC: 7.7 10*3/uL (ref 4.0–10.5)
nRBC: 0 % (ref 0.0–0.2)
nRBC: 0 % (ref 0.0–0.2)

## 2021-08-16 MED ORDER — ALTEPLASE 2 MG IJ SOLR: 2.0000 mg | Freq: Once | INTRAMUSCULAR | Status: DC | PRN

## 2021-08-16 MED ORDER — ALTEPLASE 2 MG IJ SOLR
2.0000 mg | Freq: Once | INTRAMUSCULAR | Status: DC | PRN
  Filled 2021-08-16: qty 2

## 2021-08-16 MED ORDER — HEPARIN SODIUM (PORCINE) 1000 UNIT/ML DIALYSIS
20.0000 [IU]/kg | INTRAMUSCULAR | Status: DC | PRN
  Filled 2021-08-16: qty 2

## 2021-08-16 MED ORDER — PREDNISONE 20 MG PO TABS
20.0000 mg | ORAL_TABLET | Freq: Every day | ORAL | Status: AC
  Administered 2021-08-16 – 2021-08-20 (×5): 20 mg via ORAL
  Filled 2021-08-16 (×5): qty 1

## 2021-08-16 MED ORDER — LIDOCAINE-PRILOCAINE 2.5-2.5 % EX CREA
1.0000 "application " | TOPICAL_CREAM | CUTANEOUS | Status: DC | PRN
  Filled 2021-08-16: qty 5

## 2021-08-16 MED ORDER — HEPARIN SODIUM (PORCINE) 1000 UNIT/ML DIALYSIS
1000.0000 [IU] | INTRAMUSCULAR | Status: DC | PRN
  Filled 2021-08-16: qty 1

## 2021-08-16 MED ORDER — LIDOCAINE HCL (PF) 1 % IJ SOLN: 5.0000 mL | INTRAMUSCULAR | Status: DC | PRN

## 2021-08-16 MED ORDER — LIDOCAINE-PRILOCAINE 2.5-2.5 % EX CREA: 1.0000 "application " | TOPICAL_CREAM | CUTANEOUS | Status: DC | PRN

## 2021-08-16 MED ORDER — SODIUM CHLORIDE 0.9 % IV SOLN: 100.0000 mL | INTRAVENOUS | Status: DC | PRN

## 2021-08-16 MED ORDER — PENTAFLUOROPROP-TETRAFLUOROETH EX AERO: 1.0000 "application " | INHALATION_SPRAY | CUTANEOUS | Status: DC | PRN

## 2021-08-16 MED ORDER — HEPARIN SODIUM (PORCINE) 1000 UNIT/ML DIALYSIS: 1000.0000 [IU] | INTRAMUSCULAR | Status: DC | PRN

## 2021-08-16 NOTE — Progress Notes (Signed)
Case discussed with nephrologist who wants to see how pt does with HD today prior to making referral to out-pt clinic. Will assist as needed.  Melven Sartorius Renal Navigator [phone]

## 2021-08-16 NOTE — Progress Notes (Signed)
Orthopedic Tech Progress Note Patient Details:  Lauren Osborn Dec 30, 1975 [id number]  Ortho Devices Type of Ortho Device: Haematologist Ortho Device/Splint Location: BLE Ortho Device/Splint Interventions: Ordered, Application   Post Interventions Patient Tolerated: Well Instructions Provided: Care of device  Janit Pagan 08/16/2021, 1:17 PM

## 2021-08-16 NOTE — Progress Notes (Signed)
Patient ID: Lauren Osborn, female   DOB: 18-Nov-1975, 45 y.o.   MRN: [id number]     Advanced Heart Failure Rounding Note  PCP-Cardiologist: Rozann Lesches, MD   Subjective:    [place] 11/22 w/ markedly elevated left and right filling pressure, mod pulmonary venous HTN and preserved CO.  She did not respond to maximal diuretics + milrinone 0.125, CVVH started.   Off CRRT 12/07 and iHD started.  She did not get HD yesterday, weight up 5 lbs.  She is getting HD this morning.      Walked in halls with no problems. SBP 80s-100s on midodrine.    [place] 07/26/21  Right Heart Pressures RHC Procedural Findings: Hemodynamics (mmHg) RA mean 35 RV 57/28, 35 PA 58/29, mean 46 PCWP mean 42  Oxygen saturations: PA 70% AO 99%  Cardiac Output (Fick) 7.04  Cardiac Index (Fick) 3.01 PVR < 1 WU PAPI < 1    Objective:   Weight Range: 90.9 kg Body mass index is 35.5 kg/m.   Vital Signs:   Temp:  [97.5 F (36.4 C)-97.9 F (36.6 C)] 97.5 F (36.4 C) (12/13 0540) Pulse Rate:  [62-89] 70 (12/13 0730) Resp:  [14-40] 23 (12/13 0730) BP: (79-107)/(56-81) 96/74 (12/13 0730) SpO2:  [93 %-100 %] 100 % (12/13 0730) Weight:  [90.9 kg] 90.9 kg (12/13 0540) Last BM Date: 08/15/21  Weight change: Filed Weights   08/14/21 0600 08/15/21 0500 08/16/21 0540  Weight: 87.6 kg 88.7 kg 90.9 kg    Intake/Output:   Intake/Output Summary (Last 24 hours) at 08/16/2021 0737 Last data filed at 08/16/2021 0000 Gross per 24 hour  Intake 800 ml  Output --  Net 800 ml      Physical Exam   General: NAD Neck: JVP 16 cm, no thyromegaly or thyroid nodule.  Lungs: Clear to auscultation bilaterally with normal respiratory effort. CV: Nondisplaced PMI.  Heart regular S1/S2, no S3/S4, no murmur.  1+ edema to knees.  Abdomen: Soft, nontender, no hepatosplenomegaly, no distention.  Skin: Intact without lesions or rashes.  Neurologic: Alert and oriented x 3.  Psych: Normal affect. Extremities: No clubbing or  cyanosis.  HEENT: Normal.    Telemetry   NSR 60s (personally reviewed)   Labs    CBC Recent Labs    08/16/21 0640  WBC 8.1  HGB 10.4*  HCT 32.0*  MCV 97.3  PLT 315   Basic Metabolic Panel Recent Labs    08/16/21 0111 08/16/21 0640  NA 126* 126*  K 4.8 5.0  CL 92* 90*  CO2 22 23  GLUCOSE 89 79  BUN 67* 69*  CREATININE 4.43* 4.63*  CALCIUM 9.4 9.8  PHOS 4.9* 5.1*   Liver Function Tests Recent Labs    08/16/21 0111 08/16/21 0640  ALBUMIN 3.7 3.7   No results for input(s): LIPASE, AMYLASE in the last 72 hours. Cardiac Enzymes No results for input(s): CKTOTAL, CKMB, CKMBINDEX, TROPONINI in the last 72 hours.  BNP: BNP (last 3 results) Recent Labs    03/04/21 1222 06/23/21 1807 07/22/21 1755  BNP 543.0* 357.0* 510.0*    ProBNP (last 3 results) No results for input(s): PROBNP in the last 8760 hours.   D-Dimer No results for input(s): DDIMER in the last 72 hours. Hemoglobin A1C No results for input(s): HGBA1C in the last 72 hours. Fasting Lipid Panel No results for input(s): CHOL, HDL, LDLCALC, TRIG, CHOLHDL, LDLDIRECT in the last 72 hours. Thyroid Function Tests No results for input(s): TSH, T4TOTAL, T3FREE, THYROIDAB in  the last 72 hours.  Invalid input(s): FREET3  Other results:   Imaging    No results found.   Medications:     Scheduled Medications:  Chlorhexidine Gluconate Cloth  6 each Topical Daily   Chlorhexidine Gluconate Cloth  6 each Topical Q0600   Chlorhexidine Gluconate Cloth  6 each Topical Q0600   cholecalciferol  1,000 Units Oral Daily   feeding supplement  237 mL Oral BID BM   heparin injection (subcutaneous)  5,000 Units Subcutaneous Q8H   midodrine  15 mg Oral TID WC   multivitamin  1 tablet Oral QHS   pantoprazole  40 mg Oral Daily   senna-docusate  1 tablet Oral QHS   sodium chloride flush  10-40 mL Intracatheter Q12H   sodium chloride flush  3 mL Intravenous Q12H    Infusions:  sodium chloride      sodium chloride     albumin human 25 g (08/16/21 0640)    PRN Medications: sodium chloride, sodium chloride, albumin human, alteplase, camphor-menthol, heparin, heparin, HYDROmorphone, lidocaine (PF), lidocaine-prilocaine, loratadine, ondansetron (ZOFRAN) IV, pentafluoroprop-tetrafluoroeth, polyethylene glycol, sodium chloride flush    Assessment/Plan   1. Acute on chronic diastolic CHF: With prominent RV dysfunction.  Echo from 11/22 reviewed, EF 60-65%, normal RV systolic function with mild dilation, D-shaped septum consistent with RV pressure/volume overload. Patient is markedly volume overloaded with anasarca.  Unfortunately, patient developed worsening AKI with attempted diuresis. RHC  w/ markedly elevated left and right filling pressure, mod pulmonary venous HTN and preserved CO. PAPi low at 0.82. Minimal response to maximal diuretics and milrinone 0.125, CVVH started. Now off milrinone with stable co-ox.  CVVHD stopped 12/07 after filter clotted.  Now getting iHD.  She is still quite volume overloaded, weight up 5 lbs after not getting HD yesterday. BP runs low but stable and tolerating HD on midodrine. - Continue midodrine 15 mg tid.  - Continue volume removal with HD, needs today and would also try to do it again tomorrow.  2. AKI on CKD stage IIIb: Baseline creatinine 1.9-2.3, bumped to 3.68 in the setting of severe volume overload.  Suspect cardiorenal syndrome. RHC w/ normal CO but low PAPI suggesting RV dysfunction.  She is anuric. Initially on CVVH, now on iHD.  Still volume overloaded. BP runs low but tolerating iHD with midodrine.  - CVP still high, continue HD (needs today and would ideally get tomorrow also).  - She now has catheter for long-term HD. - Will need outpatient dialysis seat, should be able to go home soon.  3. Cirrhosis: Uncertain etiology, labs negative for viral hepatitis.  Not a heavy drinker.  May be due to NAFLD or RV failure.  4. OSA: Continue CPAP at night.   5. Hyponatremia: Managed with CVVHD.  6. Morbid obesity - Body mass index is 35.5 kg/m. 7. Left ankle pain: Patient has history of gout. Symptoms improved with prednisone burst for gout, completed. - Had recurrent pain 12/06. Prednisone restarted at 20 mg daily X 5 days, now complete.  - Would benefit from allopurinol 3 X weekly once on iHD, discussed dosing with pharmD. Can start when symptoms resolved.   Would dialyze today and tomorrow, maybe home after that.   Length of Stay: [place], [place]  08/16/2021, 7:37 AM  Advanced Heart Failure Team Pager [phone] (M-F; 7a - 5p)  Please contact [facility] for night-coverage after hours (5p -7a ) and weekends on [email or web address]

## 2021-08-16 NOTE — Progress Notes (Signed)
[facility] ROUNDING NOTE   Subjective:   Interval History: 45 year old admitted with acute on chronic CHF. Echo 11/22  developed worsening AKI with attempted diuresis. CVVH was started in Nov and was stopped 12/8 . Patient now to receive intermittent HD.  Patient will receive dialysis 08/16/2021.  Hemodynamics permitting  BP 57/01 pulse 70 temperature 97.5 O2 sats 90% room air  Sodium 126 potassium 5 chloride 90 CO2 23 BUN 69 creatinine 4.63 glucose 79 calcium 9.8 phosphorus 5.1 albumin 3.7 hemoglobin 10.4    Objective:  Vital signs in last 24 hours:  Temp:  [97.5 F (36.4 C)-97.9 F (36.6 C)] 97.5 F (36.4 C) (12/13 0540) Pulse Rate:  [62-89] 70 (12/13 0715) Resp:  [14-40] 25 (12/13 0715) BP: (79-107)/(56-81) 97/67 (12/13 0715) SpO2:  [93 %-100 %] 100 % (12/13 0715) Weight:  [90.9 kg] 90.9 kg (12/13 0540)  Weight change: 2.2 kg Filed Weights   08/14/21 0600 08/15/21 0500 08/16/21 0540  Weight: 87.6 kg 88.7 kg 90.9 kg    Intake/Output: I/O last 3 completed shifts: In: 950 [P.O.:950] Out: -    Intake/Output this shift:  No intake/output data recorded.  CVS- RRR RS- CTA no wheezes  ABD- BS present soft non-distended EXT- no edema  temp dialysis catheter    Basic Metabolic Panel: Recent Labs  Lab 08/10/21 0506 08/10/21 1607 08/11/21 0400 08/11/21 1849 08/12/21 0500 08/13/21 0323 08/14/21 0423 08/15/21 0527 08/16/21 0111 08/16/21 0640  NA 131*   < > 126*   < > 129* 129* 130* 131* 126* 126*  K 4.4   < > 4.5   < > 4.0 3.6 3.7 4.8 4.8 5.0  CL 96*   < > 94*   < > 92* 93* 93* 93* 92* 90*  CO2 25   < > 24   < > 26 26 24 27 22 23   GLUCOSE 86   < > 82   < > 78 82 84 83 89 79  BUN 22*   < > 32*   < > 35* 34* 25* 51* 67* 69*  CREATININE 1.75*   < > 2.30*   < > 2.48* 2.37* 2.22* 3.46* 4.43* 4.63*  CALCIUM 9.1   < > 9.2   < > 9.3 9.0 9.3 9.7 9.4 9.8  MG 2.7*  --  2.4  --  2.2  --   --   --   --   --   PHOS 2.5   < > 3.1   < > 3.2 2.8 2.5 4.1 4.9*  5.1*   < > = values in this interval not displayed.     Liver Function Tests: Recent Labs  Lab 08/13/21 0323 08/14/21 0423 08/15/21 0527 08/16/21 0111 08/16/21 0640  ALBUMIN 3.8 3.9 3.8 3.7 3.7    No results for input(s): LIPASE, AMYLASE in the last 168 hours. No results for input(s): AMMONIA in the last 168 hours.  CBC: Recent Labs  Lab 08/10/21 0506 08/11/21 0400 08/12/21 0729 08/16/21 0640  WBC 11.5* 11.2* 9.1 8.1  HGB 10.8* 10.4* 10.5* 10.4*  HCT 33.8* 32.2* 32.1* 32.0*  MCV 98.5 97.3 97.6 97.3  PLT 193 202 173 214     Cardiac Enzymes: No results for input(s): CKTOTAL, CKMB, CKMBINDEX, TROPONINI in the last 168 hours.  BNP: Invalid input(s): POCBNP  CBG: No results for input(s): GLUCAP in the last 168 hours.  Microbiology: Results for orders placed or performed during the hospital encounter of 07/22/21  Resp Panel by  RT-PCR (Flu A&B, Covid) Nasopharyngeal Swab     Status: None   Collection Time: 07/22/21  9:54 PM   Specimen: Nasopharyngeal Swab; Nasopharyngeal(NP) swabs in vial transport medium  Result Value Ref Range Status   SARS Coronavirus 2 by RT PCR NEGATIVE NEGATIVE Final    Comment: (NOTE) SARS-CoV-2 target nucleic acids are NOT DETECTED.  The SARS-CoV-2 RNA is generally detectable in upper respiratory specimens during the acute phase of infection. The lowest concentration of SARS-CoV-2 viral copies this assay can detect is 138 copies/mL. A negative result does not preclude SARS-Cov-2 infection and should not be used as the sole basis for treatment or other patient management decisions. A negative result may occur with  improper specimen collection/handling, submission of specimen other than nasopharyngeal swab, presence of viral mutation(s) within the areas targeted by this assay, and inadequate number of viral copies(<138 copies/mL). A negative result must be combined with clinical observations, patient history, and  epidemiological information. The expected result is Negative.  Fact Sheet for Patients:  EntrepreneurPulse.com.au  Fact Sheet for Healthcare Providers:  IncredibleEmployment.be  This test is no t yet approved or cleared by the [place] FDA and  has been authorized for detection and/or diagnosis of SARS-CoV-2 by FDA under an Emergency Use Authorization (EUA). This EUA will remain  in effect (meaning this test can be used) for the duration of the COVID-19 declaration under Section 564(b)(1) of the Act, 21 U.S.C.section 360bbb-3(b)(1), unless the authorization is terminated  or revoked sooner.       Influenza A by PCR NEGATIVE NEGATIVE Final   Influenza B by PCR NEGATIVE NEGATIVE Final    Comment: (NOTE) The Xpert Xpress SARS-CoV-2/FLU/RSV plus assay is intended as an aid in the diagnosis of influenza from Nasopharyngeal swab specimens and should not be used as a sole basis for treatment. Nasal washings and aspirates are unacceptable for Xpert Xpress SARS-CoV-2/FLU/RSV testing.  Fact Sheet for Patients: EntrepreneurPulse.com.au  Fact Sheet for Healthcare Providers: IncredibleEmployment.be  This test is not yet approved or cleared by the [place] FDA and has been authorized for detection and/or diagnosis of SARS-CoV-2 by FDA under an Emergency Use Authorization (EUA). This EUA will remain in effect (meaning this test can be used) for the duration of the COVID-19 declaration under Section 564(b)(1) of the Act, 21 U.S.C. section 360bbb-3(b)(1), unless the authorization is terminated or revoked.  Performed at [facility], [address] [place], [place] [address]   MRSA Next Gen by PCR, Nasal     Status: None   Collection Time: 08/08/21  8:11 PM   Specimen: Nasal Mucosa; Nasal Swab  Result Value Ref Range Status   MRSA by PCR Next Gen NOT DETECTED NOT DETECTED Final    Comment: (NOTE) The  GeneXpert MRSA Assay (FDA approved for NASAL specimens only), is one component of a comprehensive MRSA colonization surveillance program. It is not intended to diagnose MRSA infection nor to guide or monitor treatment for MRSA infections. Test performance is not FDA approved in patients less than 44 years old. Performed at [facility], [place] [address], [place], [place] [address]     Coagulation Studies: No results for input(s): LABPROT, INR in the last 72 hours.  Urinalysis: No results for input(s): COLORURINE, LABSPEC, PHURINE, GLUCOSEU, HGBUR, BILIRUBINUR, KETONESUR, PROTEINUR, UROBILINOGEN, NITRITE, LEUKOCYTESUR in the last 72 hours.  Invalid input(s): APPERANCEUR    Imaging: No results found.   Medications:    sodium chloride     sodium chloride  albumin human 25 g (08/16/21 0640)    Chlorhexidine Gluconate Cloth  6 each Topical Daily   Chlorhexidine Gluconate Cloth  6 each Topical Q0600   Chlorhexidine Gluconate Cloth  6 each Topical Q0600   cholecalciferol  1,000 Units Oral Daily   feeding supplement  237 mL Oral BID BM   heparin injection (subcutaneous)  5,000 Units Subcutaneous Q8H   midodrine  15 mg Oral TID WC   multivitamin  1 tablet Oral QHS   pantoprazole  40 mg Oral Daily   senna-docusate  1 tablet Oral QHS   sodium chloride flush  10-40 mL Intracatheter Q12H   sodium chloride flush  3 mL Intravenous Q12H   sodium chloride, sodium chloride, albumin human, alteplase, camphor-menthol, heparin, heparin, HYDROmorphone, lidocaine (PF), lidocaine-prilocaine, loratadine, ondansetron (ZOFRAN) IV, pentafluoroprop-tetrafluoroeth, polyethylene glycol, sodium chloride flush  Assessment/ Plan:  Acute on Chronic Renal Failure  thought to be due to cardiorenal syndrome  Did receive CRRT until 12/8  now on IHD  with poor tolerability secondary to hypotension. Trying to cool dialysate although this may be problematic. Midodrine 15 mg tid.  We will schedule dialysis  08/16/2021 ANEMIA- stable at this point - no ESA at present  MBD- stable  HTN/VOL removed 2.5 L 12/11   interdialysis hypotension. No evidence of PE 11/21 ACCESS- Temp dialysis catheter   Acute on chronic diastolic heart failure  Liver Cirrhosis  - congestive hepatosteatosis . Does have some ascites on U/S in November. I wonder if this is contributing to her hypotension.   LOS: [place] @TODAY @7 :29 AM

## 2021-08-17 DIAGNOSIS — I5033 Acute on chronic diastolic (congestive) heart failure: Secondary | ICD-10-CM | POA: Diagnosis not present

## 2021-08-17 LAB — RENAL FUNCTION PANEL
Albumin: 3.7 g/dL (ref 3.5–5.0)
Anion gap: 11 (ref 5–15)
BUN: 47 mg/dL — ABNORMAL HIGH (ref 6–20)
CO2: 24 mmol/L (ref 22–32)
Calcium: 9.5 mg/dL (ref 8.9–10.3)
Chloride: 93 mmol/L — ABNORMAL LOW (ref 98–111)
Creatinine, Ser: 3.64 mg/dL — ABNORMAL HIGH (ref 0.44–1.00)
GFR, Estimated: 15 mL/min — ABNORMAL LOW (ref >60)
Glucose, Bld: 122 mg/dL — ABNORMAL HIGH (ref 70–99)
Phosphorus: 4 mg/dL (ref 2.5–4.6)
Potassium: 5.6 mmol/L — ABNORMAL HIGH (ref 3.5–5.1)
Sodium: 128 mmol/L — ABNORMAL LOW (ref 135–145)

## 2021-08-17 LAB — BASIC METABOLIC PANEL
Anion gap: 11 (ref 5–15)
BUN: 53 mg/dL — ABNORMAL HIGH (ref 6–20)
CO2: 24 mmol/L (ref 22–32)
Calcium: 9.5 mg/dL (ref 8.9–10.3)
Chloride: 92 mmol/L — ABNORMAL LOW (ref 98–111)
Creatinine, Ser: 4.08 mg/dL — ABNORMAL HIGH (ref 0.44–1.00)
GFR, Estimated: 13 mL/min — ABNORMAL LOW (ref >60)
Glucose, Bld: 91 mg/dL (ref 70–99)
Potassium: 4.5 mmol/L (ref 3.5–5.1)
Sodium: 127 mmol/L — ABNORMAL LOW (ref 135–145)

## 2021-08-17 LAB — HEPATITIS B CORE ANTIBODY, IGM: Hep B C IgM: NONREACTIVE

## 2021-08-17 MED ORDER — CHLORHEXIDINE GLUCONATE CLOTH 2 % EX PADS
6.0000 | MEDICATED_PAD | Freq: Every day | CUTANEOUS | Status: DC
  Administered 2021-08-18 – 2021-08-19 (×2): 6 via TOPICAL

## 2021-08-17 NOTE — Progress Notes (Addendum)
Requested by Dr Justin Mend to meet with pt to arrange out-pt HD. Met with pt at bedside. Discussed out-pt HD and pt prefers [facility] if possible. This clinic is close to pt's home. Pt reports that she or family can provide transport to/from HD at d/c. Pt prefers MWF schedule if possible. Explained that availability at clinic will have to investigated but will advise clinic of pt's preference. Referral faxed to [facility] admissions for review. Hep B total core antibody lab is required and contacted Dr Justin Mend to request that lab be ordered. Clinic will require a covid test prior to d/c as well. Will discuss arrangements with pt once known. Will follow and assist.   Melven Sartorius Renal Navigator [phone]  Addendum at 3:29 pm: [facility] Admissions who reports that they will accept the Hep B core antibody, IgM that was ordered by MD in place of Hep B Total Core Antibody. Will fax results once available.

## 2021-08-17 NOTE — Consult Note (Addendum)
Hospital Consult    Reason for Consult:  dialysis access Requesting Physician:  Justin Mend MRN #:  [id number]  History of Present Illness: This is a 45 y.o. female who presented to the hospital with worsening edema and anasarca and u/s showed ascites.  She has hx of cirrhosis and had acute on chronic CHF thought to be due to cardiorenal syndrome.  She has been hospitalized for almost a month and is having worsening AKI.  She had a TDC placed by IR on 08/15/2021 and vascular surgery is consulted today for permanent access.  Pt states she is right hand dominant.  She does have wraps on her legs for swelling.  She denies any wounds but does have gout in the left great toe.  She denies any claudication.  She states she is feeling much better since when she was admitted.   The pt is not on a statin for cholesterol management.  The pt is not on a daily aspirin.   Other AC:  sq heparin The pt is not on medication for hypertension.   The pt is not diabetic.   Tobacco hx:  former  Past Medical History:  Diagnosis Date   Abnormal bilirubin test    Cholelithiasis    Chronic kidney disease, stage 3b (HCC)    Diastolic congestive heart failure (HCC)    Esophagitis    Essential hypertension    Fibroids    Gastritis    GERD (gastroesophageal reflux disease)    Gout    Liver hemangioma    Morbid obesity ([place])    Normocytic anemia    OSA (obstructive sleep apnea)    Thyroid nodule    Tubular adenoma     Past Surgical History:  Procedure Laterality Date   BIOPSY  08/03/2020   Procedure: BIOPSY;  Surgeon: Eloise Harman, DO;  [facility];  Service: Endoscopy;;  duodenal gastric   CESAREAN SECTION     COLONOSCOPY WITH PROPOFOL N/A 08/03/2020    Surgeon: Hurshel Keys K, DO; 5 mm tubular adenoma in the sigmoid colon, nonbleeding internal hemorrhoids.  Recommended repeat in 5 years.   ESOPHAGOGASTRODUODENOSCOPY (EGD) WITH PROPOFOL N/A 08/03/2020   Surgeon: Eloise Harman, DO;  LA grade C esophagitis without bleeding, gastritis with erythema and shallow ulcerations in gastric antrum biopsied (negative for H. pylori), normal examined duodenum s/p biopsy (benign).   HYSTERECTOMY ABDOMINAL WITH SALPINGECTOMY  2008   IR FLUORO GUIDE CV LINE RIGHT  08/15/2021   IR US GUIDE VASC ACCESS RIGHT  08/15/2021   POLYPECTOMY  08/03/2020   Procedure: POLYPECTOMY;  Surgeon: Eloise Harman, DO;  [facility];  Service: Endoscopy;;  colon   RIGHT HEART CATH N/A 07/26/2021   Procedure: RIGHT HEART CATH;  Surgeon: Larey Dresser, MD;  Location: [facility];  Service: Cardiovascular;  Laterality: N/A;   THYROIDECTOMY Right 10/21/2018   Procedure: RIGHT HEMITHYROIDECTOMY;  Surgeon: Leta Baptist, MD;  Location: [facility];  Service: ENT;  Laterality: Right;    Allergies  Allergen Reactions   Oxycodone-Acetaminophen Hives and Other (See Comments)    Prior to Admission medications   Medication Sig Start Date End Date Taking? Authorizing Provider  acetaminophen (TYLENOL) 500 MG tablet Take 1,000 mg by mouth every 6 (six) hours as needed for moderate pain or headache.   Yes [provider]  magnesium oxide (MAG-OX) 400 MG tablet TAKE 1 TABLET BY MOUTH ONCE DAILY. 01/17/21  Yes Satira Sark, MD  metolazone (ZAROXOLYN) 2.5 MG tablet Take 1 tablet (2.5 mg total) by mouth See admin instructions. 2.5 mg every M W F 06/28/21  Yes Barton Dubois, MD  omeprazole (PRILOSEC) 20 MG capsule Take 1 capsule (20 mg total) by mouth daily before breakfast. 07/22/21 01/18/22 Yes Jodi Mourning, Tivis Ringer, PA-C  potassium chloride SA (KLOR-CON) 20 MEQ tablet Take 3 tablets (60 mEq total) by mouth 3 (three) times daily. Take 2 additional Tablets on the Days That you take Metolazone 07/15/21  Yes Strader, [place], PA-C  spironolactone (ALDACTONE) 25 MG tablet Take 1 tablet (25 mg total) by mouth daily. 06/28/21  Yes Barton Dubois, MD  TART CHERRY PO Take by mouth daily.    Yes [provider]  torsemide (DEMADEX) 20 MG tablet Take 4 tablets in am and 3 tablets in the evening. 06/28/21  Yes Barton Dubois, MD    Social History   Socioeconomic History   Marital status: Single    Spouse name: Not on file   Number of children: Not on file   Years of education: Not on file   Highest education level: Not on file  Occupational History   Not on file  Tobacco Use   Smoking status: Former    Packs/day: 0.50    Years: 20.00    Pack years: 10.00    Types: Cigarettes    Quit date: 2017    Years since quitting: 5.9   Smokeless tobacco: Never  Vaping Use   Vaping Use: Never used  Substance and Sexual Activity   Alcohol use: Not Currently   Drug use: Never   Sexual activity: Yes    Comment: hyst  Other Topics Concern   Not on file  Social History Narrative   Not on file   Social Determinants of Health   Financial Resource Strain: Low Risk    Difficulty of Paying Living Expenses: Not very hard  Food Insecurity: No Food Insecurity   Worried About Charity fundraiser in the Last Year: Never true   [place] in the Last Year: Never true  Transportation Needs: No Transportation Needs   Lack of Transportation (Medical): No   Lack of Transportation (Non-Medical): No  Physical Activity: Not on file  Stress: Not on file  Social Connections: Not on file  Intimate Partner Violence: Not on file     Family History  Problem Relation Age of Onset   Breast cancer Mother    Other Paternal Grandfather        house fire   Other Maternal Grandmother        house fire   Congestive Heart Failure Maternal Grandfather    Colon cancer Father 85   Diverticulitis Brother    Diverticulitis Sister    Colon polyps Sister 40   Diabetes Daughter        borderline    ROS: [x]  Positive   [ ]  Negative   [ ]  All sytems reviewed and are negative  Cardiac: [x]  CHF  [x]  SOB lying flat [x]  DOE  Vascular: []  pain in legs while walking []  pain in  legs at rest []  pain in legs at night []  non-healing ulcers []  hx of DVT []  swelling in legs  Pulmonary: [x]  OSA  Neurologic: []  hx of CVA []  mini stroke   Hematologic: []  hx of cancer  Endocrine:   []  diabetes []  thyroid disease  GI [x]  GERD [X]  cirrhosis  GU: [x]  CKD/renal failure [x]  HD--[]  M/W/F or []   T/T/S  Psychiatric: []  anxiety []  depression  Musculoskeletal: []  arthritis []  joint pain  Integumentary: []  rashes []  ulcers  Constitutional: []  fever  []  chills  Physical Examination  Vitals:   08/16/21 2100 08/17/21 0616  BP: 93/64 91/67  Pulse: 71   Resp: 16 16  Temp: 98.7 F (37.1 C) 98.1 F (36.7 C)  SpO2: 98%    Body mass index is 34.97 kg/m.  General:  WDWN in NAD Gait: Not observed HENT: WNL, normocephalic Pulmonary: normal non-labored breathing Cardiac: regular, without Murmur; without carotid bruits Abdomen: obese Skin: without rashes Vascular Exam/Pulses:  Right Left  Radial 2+ (normal) 2+ (normal)   Extremities: without ischemic changes, without Gangrene , without cellulitis; without open wounds; IV present in left upper arm.  Wraps on BLE Musculoskeletal: no muscle wasting or atrophy  Neurologic: A&O X 3; speech is fluent/normal Psychiatric:  The pt has Normal affect.   CBC    Component Value Date/Time   WBC 7.7 08/16/2021 1243   RBC 3.32 (L) 08/16/2021 1243   HGB 10.5 (L) 08/16/2021 1243   HCT 32.5 (L) 08/16/2021 1243   PLT 205 08/16/2021 1243   MCV 97.9 08/16/2021 1243   MCH 31.6 08/16/2021 1243   MCHC 32.3 08/16/2021 1243   RDW 15.6 (H) 08/16/2021 1243   LYMPHSABS 1.0 07/22/2021 1755   MONOABS 0.6 07/22/2021 1755   EOSABS 0.1 07/22/2021 1755   BASOSABS 0.1 07/22/2021 1755    BMET    Component Value Date/Time   NA 127 (L) 08/17/2021 0945   K 4.5 08/17/2021 0945   CL 92 (L) 08/17/2021 0945   CO2 24 08/17/2021 0945   GLUCOSE 91 08/17/2021 0945   BUN 53 (H) 08/17/2021 0945   CREATININE 4.08 (H)  08/17/2021 0945   CALCIUM 9.5 08/17/2021 0945   GFRNONAA 13 (L) 08/17/2021 0945   GFRAA >60 02/06/2020 1932    COAGS: Lab Results  Component Value Date   INR 1.2 07/22/2021   INR 1.3 (H) 09/16/2020     Non-Invasive Vascular Imaging:   BUE vein mapping has been ordered.   ASSESSMENT/PLAN: This is a 45 y.o. female with AKI now requiring HD and in need of permanent dialysis placement  -pt is right hand dominant. She does have an IV in the left upper arm that needs to be moved to the right arm and left arm restricted.   -vein mapping is pending-will make recommendations once this is complete.    -discussed fistula vs graft and steal syndrome with pt.   -Dr. Virl Cagey to see pt later today.   Leontine Locket, PA-C [facility] [phone]  VASCULAR STAFF ADDENDUM: I have independently interviewed and examined the patient. I agree with the above.  Right handed female. Will follow up vein mapping  Cassandria Santee, MD [facility] Phone Number: [phone] 08/18/2021 3:56 PM

## 2021-08-17 NOTE — Progress Notes (Signed)
[facility] ROUNDING NOTE   Subjective:   Interval History: 45 year old admitted with acute on chronic CHF. Echo 11/22  developed worsening AKI with attempted diuresis. CVVH was started in Nov and was stopped 12/8 . Patient now to receive intermittent HD.  Patient received dialysis 08/16/2021 with 2.5 L removed.  Baseline creatinine appears to be about 2 mg/dL  BP 97/64 pulse 71 temperature 98.1  Minimal urine output 200 cc recorded 10/18/2020  Sodium 128 potassium 5.6 chloride 93 CO2 24 BUN 47 creatinine 3.64 glucose 122 phosphorus 4 albumin 3.7 hemoglobin 10.5   Objective:  Vital signs in last 24 hours:  Temp:  [97.7 F (36.5 C)-98.7 F (37.1 C)] 98.1 F (36.7 C) (12/14 0616) Pulse Rate:  [68-94] 71 (12/13 2100) Resp:  [16-18] 16 (12/14 0616) BP: (87-116)/(63-86) 91/67 (12/14 0616) SpO2:  [98 %-100 %] 98 % (12/13 2100) Weight:  [89.5 kg] 89.5 kg (12/14 0616)  Weight change: -2.9 kg Filed Weights   08/16/21 0540 08/16/21 0941 08/17/21 0616  Weight: 90.9 kg 88 kg 89.5 kg    Intake/Output: I/O last 3 completed shifts: In: 4765 [P.O.:1164; IV Piggyback:150] Out: 2750 [Urine:250; Other:2500]   Intake/Output this shift:  No intake/output data recorded.  CVS- RRR RS- CTA no wheezes  ABD- BS present soft non-distended EXT- no edema  temp dialysis catheter    Basic Metabolic Panel: Recent Labs  Lab 08/11/21 0400 08/11/21 1849 08/12/21 0500 08/13/21 0323 08/15/21 0527 08/16/21 0111 08/16/21 0640 08/16/21 1243 08/17/21 0232  NA 126*   < > 129*   < > 131* 126* 126* 132* 128*  K 4.5   < > 4.0   < > 4.8 4.8 5.0 3.7 5.6*  CL 94*   < > 92*   < > 93* 92* 90* 95* 93*  CO2 24   < > 26   < > 27 22 23 25 24   GLUCOSE 82   < > 78   < > 83 89 79 124* 122*  BUN 32*   < > 35*   < > 51* 67* 69* 31* 47*  CREATININE 2.30*   < > 2.48*   < > 3.46* 4.43* 4.63* 2.72* 3.64*  CALCIUM 9.2   < > 9.3   < > 9.7 9.4 9.8 9.3 9.5  MG 2.4  --  2.2  --   --   --   --   --   --    PHOS 3.1   < > 3.2   < > 4.1 4.9* 5.1* 2.4* 4.0   < > = values in this interval not displayed.     Liver Function Tests: Recent Labs  Lab 08/15/21 0527 08/16/21 0111 08/16/21 0640 08/16/21 1243 08/17/21 0232  ALBUMIN 3.8 3.7 3.7 4.0 3.7    No results for input(s): LIPASE, AMYLASE in the last 168 hours. No results for input(s): AMMONIA in the last 168 hours.  CBC: Recent Labs  Lab 08/11/21 0400 08/12/21 0729 08/16/21 0640 08/16/21 1243  WBC 11.2* 9.1 8.1 7.7  HGB 10.4* 10.5* 10.4* 10.5*  HCT 32.2* 32.1* 32.0* 32.5*  MCV 97.3 97.6 97.3 97.9  PLT 202 173 214 205     Cardiac Enzymes: No results for input(s): CKTOTAL, CKMB, CKMBINDEX, TROPONINI in the last 168 hours.  BNP: Invalid input(s): POCBNP  CBG: No results for input(s): GLUCAP in the last 168 hours.  Microbiology: Results for orders placed or performed during the hospital encounter of 07/22/21  Resp Panel by  RT-PCR (Flu A&B, Covid) Nasopharyngeal Swab     Status: None   Collection Time: 07/22/21  9:54 PM   Specimen: Nasopharyngeal Swab; Nasopharyngeal(NP) swabs in vial transport medium  Result Value Ref Range Status   SARS Coronavirus 2 by RT PCR NEGATIVE NEGATIVE Final    Comment: (NOTE) SARS-CoV-2 target nucleic acids are NOT DETECTED.  The SARS-CoV-2 RNA is generally detectable in upper respiratory specimens during the acute phase of infection. The lowest concentration of SARS-CoV-2 viral copies this assay can detect is 138 copies/mL. A negative result does not preclude SARS-Cov-2 infection and should not be used as the sole basis for treatment or other patient management decisions. A negative result may occur with  improper specimen collection/handling, submission of specimen other than nasopharyngeal swab, presence of viral mutation(s) within the areas targeted by this assay, and inadequate number of viral copies(<138 copies/mL). A negative result must be combined with clinical observations,  patient history, and epidemiological information. The expected result is Negative.  Fact Sheet for Patients:  EntrepreneurPulse.com.au  Fact Sheet for Healthcare Providers:  IncredibleEmployment.be  This test is no t yet approved or cleared by the [place] FDA and  has been authorized for detection and/or diagnosis of SARS-CoV-2 by FDA under an Emergency Use Authorization (EUA). This EUA will remain  in effect (meaning this test can be used) for the duration of the COVID-19 declaration under Section 564(b)(1) of the Act, 21 U.S.C.section 360bbb-3(b)(1), unless the authorization is terminated  or revoked sooner.       Influenza A by PCR NEGATIVE NEGATIVE Final   Influenza B by PCR NEGATIVE NEGATIVE Final    Comment: (NOTE) The Xpert Xpress SARS-CoV-2/FLU/RSV plus assay is intended as an aid in the diagnosis of influenza from Nasopharyngeal swab specimens and should not be used as a sole basis for treatment. Nasal washings and aspirates are unacceptable for Xpert Xpress SARS-CoV-2/FLU/RSV testing.  Fact Sheet for Patients: EntrepreneurPulse.com.au  Fact Sheet for Healthcare Providers: IncredibleEmployment.be  This test is not yet approved or cleared by the [place] FDA and has been authorized for detection and/or diagnosis of SARS-CoV-2 by FDA under an Emergency Use Authorization (EUA). This EUA will remain in effect (meaning this test can be used) for the duration of the COVID-19 declaration under Section 564(b)(1) of the Act, 21 U.S.C. section 360bbb-3(b)(1), unless the authorization is terminated or revoked.  Performed at [facility], [address] [place], [place] [address]   MRSA Next Gen by PCR, Nasal     Status: None   Collection Time: 08/08/21  8:11 PM   Specimen: Nasal Mucosa; Nasal Swab  Result Value Ref Range Status   MRSA by PCR Next Gen NOT DETECTED NOT DETECTED Final     Comment: (NOTE) The GeneXpert MRSA Assay (FDA approved for NASAL specimens only), is one component of a comprehensive MRSA colonization surveillance program. It is not intended to diagnose MRSA infection nor to guide or monitor treatment for MRSA infections. Test performance is not FDA approved in patients less than 71 years old. Performed at [facility], [place] [address] [place], [place] [address]     Coagulation Studies: No results for input(s): LABPROT, INR in the last 72 hours.  Urinalysis: No results for input(s): COLORURINE, LABSPEC, PHURINE, GLUCOSEU, HGBUR, BILIRUBINUR, KETONESUR, PROTEINUR, UROBILINOGEN, NITRITE, LEUKOCYTESUR in the last 72 hours.  Invalid input(s): APPERANCEUR    Imaging: No results found.   Medications:    sodium chloride     sodium chloride  albumin human 25 g (08/16/21 0640)    Chlorhexidine Gluconate Cloth  6 each Topical Q0600   cholecalciferol  1,000 Units Oral Daily   feeding supplement  237 mL Oral BID BM   heparin injection (subcutaneous)  5,000 Units Subcutaneous Q8H   midodrine  15 mg Oral TID WC   multivitamin  1 tablet Oral QHS   pantoprazole  40 mg Oral Daily   predniSONE  20 mg Oral Q breakfast   senna-docusate  1 tablet Oral QHS   sodium chloride, sodium chloride, albumin human, alteplase, camphor-menthol, heparin, heparin, HYDROmorphone, lidocaine (PF), lidocaine-prilocaine, loratadine, ondansetron (ZOFRAN) IV, pentafluoroprop-tetrafluoroeth, polyethylene glycol  Assessment/ Plan:  Acute on Chronic Renal Failure  thought to be due to cardiorenal syndrome  Did receive CRRT until 12/8  now on IHD  with poor tolerability secondary to hypotension. Trying to cool dialysate although this may be problematic. Midodrine 15 mg tid.  Appeared to tolerate dialysis 08/16/2021 with 2.5 L removed.  Next dialysis will be 08/17/2021 ANEMIA- stable at this point - no ESA at present  MBD- stable  HTN/VOL removed 2.5 L 08/16/2021  interdialysis hypotension. No evidence of PE 11/21 ACCESS- Temp dialysis catheter   Acute on chronic diastolic heart failure  Liver Cirrhosis  - congestive hepatosteatosis . Does have some ascites on U/S in November. I wonder if this is contributing to her hypotension. We should work on clip process for outpatient dialysis.  We will consult vein and vascular surgery for AV fistula placement. we will order some vein mapping's   LOS: [place] @TODAY @10 :22 AM

## 2021-08-17 NOTE — Progress Notes (Signed)
Nutrition Follow-up  DOCUMENTATION CODES:  Obesity unspecified  INTERVENTION:  Continue Ensure BID.  Continue Rena-Vite daily.  Continue to encourage good PO and supplement intake.  NUTRITION DIAGNOSIS:  Increased nutrient needs related to acute illness as evidenced by estimated needs. - ongoing, addressed with supplements  GOAL:  Patient will meet greater than or equal to 90% of their needs. - meeting consistently  MONITOR:  PO intake, Supplement acceptance, Labs, Weight trends  REASON FOR ASSESSMENT:  LOS, Rounds    ASSESSMENT:  45 yo female with acute on chronic CHF, AKI on CKD 3 requiring CRRT for significant volume removal, +cirrhosis/ PMH includes HTN, gout, GERD, CKD, liver hemangioma/liver disease, cholelithiasis, gastritis, esophagitis 11/25 - CRRT initiation 12/7 - CRRT discontinued 12/8 - First iHD  12/12 - HD catheter placed  Spoke with pt at bedside briefly. Pt sleeping but woke up to talk to RD.  Pt reports a good appetite and continuing to take Ensure supplements daily. She reports she does not love the Ensure, but she will continue to take them at this time.  Per Epic, pt continues to eat 75-100% of meals, 7 out of 8 meals are at 100%.  UOP: 250 ml/24 hrs  Admit wt: 143.3 kg Current wt: 89.5 kg  Pt does ankowledge that she weighed 315 pounds prior to admission and she believes she had been weighing this for a while. Pt does not know her true dry weight.   Continue current nutrition plan.  Supplements: Ensure BID  Medications: reviewed; Vitamin D3, midodrine TID, Rena-Vite, Protonix, prednisone, Senokot  Labs: reviewed; Na 127 (L), BUN 53 (H - trending up again), Crt 4.08 (H - trending up)  Vitamin Labs (08/08/21): Vitamin C: 1.2 (wdl) Folate: 11.3 (wdl) Vitamin B6: 32 (wdl) Copper: 151 (wdl)  Diet Order:   Diet Order             Diet 2 gram sodium Room service appropriate? Yes; Fluid consistency: Thin; Fluid restriction: 1200 mL Fluid   Diet effective now                  EDUCATION NEEDS:  Not appropriate for education at this time  Skin:  Skin Assessment: Reviewed RN Assessment  Last BM:  08/15/21  Height:  Ht Readings from Last 1 Encounters:  07/23/21 5\' 3"  (1.6 m)   Weight:  Wt Readings from Last 1 Encounters:  08/17/21 89.5 kg   BMI:  Body mass index is 34.97 kg/m.  Estimated Nutritional Needs:  Kcal:  2000-2200 kcals Protein:  100-120 g Fluid:  1200 mL fluid restriction per MD  Derrel Nip, RD, LDN (she/her/hers) Clinical Inpatient Dietitian RD Pager/After-Hours/Weekend Pager # in [place]

## 2021-08-17 NOTE — Progress Notes (Addendum)
Patient ID: Lauren Osborn, female   DOB: 08-14-1976, 45 y.o.   MRN: [id number]     Advanced Heart Failure Rounding Note  PCP-Cardiologist: Rozann Lesches, MD   Subjective:    [place] 11/22 w/ markedly elevated left and right filling pressure, mod pulmonary venous HTN and preserved CO.  She did not respond to maximal diuretics + milrinone 0.125, CVVH started.   Off CRRT 12/07 and iHD started.  Got iHD yesterday. Weight down 6 lb post dialysis, up 3 lb overnight.  Has been up walking halls without issue. SBP 90s, in 80s during HD. On midodrine.  Recurrent gout pain yesterday and started on another prednisone burst.   RHC 07/26/21  Right Heart Pressures RHC Procedural Findings: Hemodynamics (mmHg) RA mean 35 RV 57/28, 35 PA 58/29, mean 46 PCWP mean 42  Oxygen saturations: PA 70% AO 99%  Cardiac Output (Fick) 7.04  Cardiac Index (Fick) 3.01 PVR < 1 WU PAPI < 1    Objective:   Weight Range: 89.5 kg Body mass index is 34.97 kg/m.   Vital Signs:   Temp:  [96.9 F (36.1 C)-98.7 F (37.1 C)] 98.1 F (36.7 C) (12/14 0616) Pulse Rate:  [65-94] 71 (12/13 2100) Resp:  [13-34] 16 (12/14 0616) BP: (80-116)/(53-86) 91/67 (12/14 0616) SpO2:  [98 %-100 %] 98 % (12/13 2100) Weight:  [88 kg-89.5 kg] 89.5 kg (12/14 0616) Last BM Date: 08/15/21  Weight change: Filed Weights   08/16/21 0540 08/16/21 0941 08/17/21 0616  Weight: 90.9 kg 88 kg 89.5 kg    Intake/Output:   Intake/Output Summary (Last 24 hours) at 08/17/2021 0837 Last data filed at 08/17/2021 0600 Gross per 24 hour  Intake 1044 ml  Output 2750 ml  Net -1706 ml      Physical Exam   General:  Lying comfortably in bed. No distress. HEENT: normal Neck: supple. JVP 12 cm. Carotids 2+ bilat; no bruits. No lymphadenopathy or thryomegaly appreciated. Cor: PMI nondisplaced. Regular rate & rhythm. No rubs, gallops or murmurs. Lungs: clear Abdomen: soft, nontender, nondistended. No hepatosplenomegaly. No bruits or  masses. Good bowel sounds. Extremities: no cyanosis, clubbing, rash, 1+ edema, UNNA on Neuro: alert & orientedx3, cranial nerves grossly intact. moves all 4 extremities w/o difficulty. Affect pleasant    Telemetry   NSR 60s-70s (personally reviewed)   Labs    CBC Recent Labs    08/16/21 0640 08/16/21 1243  WBC 8.1 7.7  HGB 10.4* 10.5*  HCT 32.0* 32.5*  MCV 97.3 97.9  PLT 214 629   Basic Metabolic Panel Recent Labs    08/16/21 1243 08/17/21 0232  NA 132* 128*  K 3.7 5.6*  CL 95* 93*  CO2 25 24  GLUCOSE 124* 122*  BUN 31* 47*  CREATININE 2.72* 3.64*  CALCIUM 9.3 9.5  PHOS 2.4* 4.0   Liver Function Tests Recent Labs    08/16/21 1243 08/17/21 0232  ALBUMIN 4.0 3.7   No results for input(s): LIPASE, AMYLASE in the last 72 hours. Cardiac Enzymes No results for input(s): CKTOTAL, CKMB, CKMBINDEX, TROPONINI in the last 72 hours.  BNP: BNP (last 3 results) Recent Labs    03/04/21 1222 06/23/21 1807 07/22/21 1755  BNP 543.0* 357.0* 510.0*    ProBNP (last 3 results) No results for input(s): PROBNP in the last 8760 hours.   D-Dimer No results for input(s): DDIMER in the last 72 hours. Hemoglobin A1C No results for input(s): HGBA1C in the last 72 hours. Fasting Lipid Panel No results for input(s): CHOL,  HDL, LDLCALC, TRIG, CHOLHDL, LDLDIRECT in the last 72 hours. Thyroid Function Tests No results for input(s): TSH, T4TOTAL, T3FREE, THYROIDAB in the last 72 hours.  Invalid input(s): FREET3  Other results:   Imaging    No results found.   Medications:     Scheduled Medications:  Chlorhexidine Gluconate Cloth  6 each Topical Q0600   cholecalciferol  1,000 Units Oral Daily   feeding supplement  237 mL Oral BID BM   heparin injection (subcutaneous)  5,000 Units Subcutaneous Q8H   midodrine  15 mg Oral TID WC   multivitamin  1 tablet Oral QHS   pantoprazole  40 mg Oral Daily   predniSONE  20 mg Oral Q breakfast   senna-docusate  1 tablet  Oral QHS    Infusions:  sodium chloride     sodium chloride     albumin human 25 g (08/16/21 0640)    PRN Medications: sodium chloride, sodium chloride, albumin human, alteplase, camphor-menthol, heparin, heparin, HYDROmorphone, lidocaine (PF), lidocaine-prilocaine, loratadine, ondansetron (ZOFRAN) IV, pentafluoroprop-tetrafluoroeth, polyethylene glycol    Assessment/Plan   1. Acute on chronic diastolic CHF: With prominent RV dysfunction.  Echo from 11/22 reviewed, EF 60-65%, normal RV systolic function with mild dilation, D-shaped septum consistent with RV pressure/volume overload. Patient is markedly volume overloaded with anasarca.  Unfortunately, patient developed worsening AKI with attempted diuresis. RHC  w/ markedly elevated left and right filling pressure, mod pulmonary venous HTN and preserved CO. PAPi low at 0.82. Minimal response to maximal diuretics and milrinone 0.125, CVVH started. Now off milrinone with stable co-ox.   - CVVHD stopped 12/07 after filter clotted.  Now getting iHD.  Weight down 6 lb after HD yesterday, but up 3 lb overnight. Continue volume removal with HD, needs again today. - BP runs low but stable and tolerating HD on midodrine. Continue midodrine 15 mg tid.  2. AKI on CKD stage IIIb: Baseline creatinine 1.9-2.3, bumped to 3.68 in the setting of severe volume overload.  Suspect cardiorenal syndrome. RHC w/ normal CO but low PAPI suggesting RV dysfunction.  She is anuric. Initially on CVVH, now on iHD.  Still volume overloaded. BP runs low but tolerating iHD with midodrine.  - Volume still up, continue HD (needs today).  - She now has catheter for long-term HD. - Will need outpatient dialysis seat, should be able to go home soon.  3. Cirrhosis: Uncertain etiology, labs negative for viral hepatitis.  Not a heavy drinker.  May be due to NAFLD or RV failure.  4. OSA: Continue CPAP at night.  5. Hyponatremia: Managed with CVVHD.  6. Morbid obesity - Body mass  index is 34.97 kg/m. 7. Left ankle pain: Patient has history of gout. Symptoms improved with prednisone burst for gout, completed. - Had recurrent pain 12/13. Prednisone restarted at 20 mg daily X 5 days - Would benefit from allopurinol 100 daily once acute flare resolved, discussed dosing with pharmD.  8. Hyperkalemia: - K 5.6 this am. Repeat labs.  - May need Lokelma if remains elevated on recheck  Would dialyze again today. Hopefully can discharge home later today or tomorrow.  Length of Stay: [place], [place], PA-C  08/17/2021, 8:37 AM  Advanced Heart Failure Team Pager [phone] (M-F; 7a - 5p)  Please contact [facility] for night-coverage after hours (5p -7a ) and weekends on [email or web address]  Patient seen with PA, agree with the above note.   She had dialysis yesterday, tolerated well.  BP ok on midodrine.  Would repeat HD today with ongoing volume overload.  Hopefully  we will be able to get her home soon after outpatient HD seat arranged.   Lauren Osborn 08/17/2021

## 2021-08-17 NOTE — Progress Notes (Signed)
Physical Therapy Treatment and Discharge Patient Details Name: Lauren Osborn MRN: [id number] DOB: 10/14/1975 Today's Date: 08/17/2021   History of Present Illness 45 yo admitted 11/18 with CHF, AKI on CKD, cirrhosis. 11/22 Rt heart cath. 11/25 CRRT started, stopped 12/8 for transition to intermittent HD. PMhx: HTN, CHF, OSA, gout, and obesity.    PT Comments    Pt provided with HEP (see below) and is able to complete independently. Pt has met her goals and is independent with mobility. D/c plans remain appropriate as she would benefit from OP PT to progress strength and balance. . PT discharging from caseload. Please reorder if pt has a change in status during hospitalization   Recommendations for follow up therapy are one component of a multi-disciplinary discharge planning process, led by the attending physician.  Recommendations may be updated based on patient status, additional functional criteria and insurance authorization.  Follow Up Recommendations  Outpatient PT     Assistance Recommended at Discharge PRN  Equipment Recommendations  None recommended by PT    Recommendations for Other Services       Precautions / Restrictions Precautions Precautions: Fall;Other (comment) Precaution Comments: LE edema Restrictions Weight Bearing Restrictions: No     Mobility  Bed Mobility Overal bed mobility: Independent                  Transfers Overall transfer level: Independent                      Ambulation/Gait Ambulation/Gait assistance: Modified independent (Device/Increase time) Gait Distance (Feet): 20 Feet Assistive device: None Gait Pattern/deviations: Step-through pattern                  Balance Overall balance assessment: Independent                                          Cognition Arousal/Alertness: Awake/alert Behavior During Therapy: WFL for tasks assessed/performed Overall Cognitive Status: Within  Functional Limits for tasks assessed                                          Exercises Other Exercises Other Exercises: Access Code: [place]  URL: [email or web address]  Date: 08/17/2021  Prepared by: Benjamine Mola    Exercises  Standing Hip Flexion AROM - 1 x daily - 7 x weekly - 3 sets - 5 reps  Standing Hip Extension - 1 x daily - 7 x weekly - 3 sets - 5 reps  Standing Hip Abduction - 1 x daily - 7 x weekly - 3 sets - 5 reps  Standing Knee Flexion AROM with Chair Support - 1 x daily - 7 x weekly - 3 sets - 5 reps  Mini Squats at Table - 1 x daily - 7 x weekly - 3 sets - 5 reps  Standing Heel Raise with Support - 1 x daily - 7 x weekly - 3 sets - 5 reps  Toe Raises with Counter Support - 1 x daily - 7 x weekly - 3 sets - 5 reps    General Comments General comments (skin integrity, edema, etc.): VSS on RA      Pertinent Vitals/Pain Pain Assessment: Faces Faces Pain Scale: Hurts a little bit Pain Location: L LE Pain Descriptors /  Indicators: Guarding Pain Intervention(s): Limited activity within patient's tolerance     PT Goals (current goals can now be found in the care plan section) Acute Rehab PT Goals Patient Stated Goal: be able to return home and to work PT Goal Formulation: With patient Time For Goal Achievement: 08/22/21 Potential to Achieve Goals: Good Progress towards PT goals: Goals met/education completed, patient discharged from PT    Frequency    Min 3X/week      PT Plan Current plan remains appropriate       AM-PAC PT "6 Clicks" Mobility   Outcome Measure  Help needed turning from your back to your side while in a flat bed without using bedrails?: None Help needed moving from lying on your back to sitting on the side of a flat bed without using bedrails?: None Help needed moving to and from a bed to a chair (including a wheelchair)?: None Help needed standing up from a chair using your arms (e.g., wheelchair or bedside chair)?:  None Help needed to walk in hospital room?: None Help needed climbing 3-5 steps with a railing? : A Little 6 Click Score: 23    End of Session   Activity Tolerance: Patient tolerated treatment well Patient left: with call bell/phone within reach;in chair Nurse Communication: Mobility status PT Visit Diagnosis: Other abnormalities of gait and mobility (R26.89);Difficulty in walking, not elsewhere classified (R26.2);Muscle weakness (generalized) (M62.81)     Time: 4132-4401 PT Time Calculation (min) (ACUTE ONLY): 16 min  Charges:  $Therapeutic Exercise: 8-22 mins                     Lismary Kiehn B. Migdalia Dk PT, DPT Acute Rehabilitation Services Pager [phone] Office [phone]    [place] 08/17/2021, 9:03 AM

## 2021-08-18 ENCOUNTER — Inpatient Hospital Stay (HOSPITAL_COMMUNITY): Payer: Managed Care, Other (non HMO)

## 2021-08-18 DIAGNOSIS — I5033 Acute on chronic diastolic (congestive) heart failure: Secondary | ICD-10-CM | POA: Diagnosis not present

## 2021-08-18 DIAGNOSIS — N186 End stage renal disease: Secondary | ICD-10-CM

## 2021-08-18 LAB — CBC
HCT: 32.7 % — ABNORMAL LOW (ref 36.0–46.0)
Hemoglobin: 10.7 g/dL — ABNORMAL LOW (ref 12.0–15.0)
MCH: 31.8 pg (ref 26.0–34.0)
MCHC: 32.7 g/dL (ref 30.0–36.0)
MCV: 97.3 fL (ref 80.0–100.0)
Platelets: 257 10*3/uL (ref 150–400)
RBC: 3.36 MIL/uL — ABNORMAL LOW (ref 3.87–5.11)
RDW: 15.3 % (ref 11.5–15.5)
WBC: 11.2 10*3/uL — ABNORMAL HIGH (ref 4.0–10.5)
nRBC: 0 % (ref 0.0–0.2)

## 2021-08-18 LAB — RENAL FUNCTION PANEL
Albumin: 4.1 g/dL (ref 3.5–5.0)
Anion gap: 13 (ref 5–15)
BUN: 64 mg/dL — ABNORMAL HIGH (ref 6–20)
CO2: 21 mmol/L — ABNORMAL LOW (ref 22–32)
Calcium: 9.5 mg/dL (ref 8.9–10.3)
Chloride: 91 mmol/L — ABNORMAL LOW (ref 98–111)
Creatinine, Ser: 4.15 mg/dL — ABNORMAL HIGH (ref 0.44–1.00)
GFR, Estimated: 13 mL/min — ABNORMAL LOW (ref >60)
Glucose, Bld: 95 mg/dL (ref 70–99)
Phosphorus: 4.4 mg/dL (ref 2.5–4.6)
Potassium: 5 mmol/L (ref 3.5–5.1)
Sodium: 125 mmol/L — ABNORMAL LOW (ref 135–145)

## 2021-08-18 MED ORDER — ALTEPLASE 2 MG IJ SOLR: 2.0000 mg | Freq: Once | INTRAMUSCULAR | Status: DC | PRN

## 2021-08-18 MED ORDER — SODIUM CHLORIDE 0.9 % IV SOLN: 100.0000 mL | INTRAVENOUS | Status: DC | PRN

## 2021-08-18 MED ORDER — HEPARIN SODIUM (PORCINE) 1000 UNIT/ML DIALYSIS
1000.0000 [IU] | INTRAMUSCULAR | Status: DC | PRN
  Filled 2021-08-18: qty 1

## 2021-08-18 MED ORDER — PENTAFLUOROPROP-TETRAFLUOROETH EX AERO: 1.0000 "application " | INHALATION_SPRAY | CUTANEOUS | Status: DC | PRN

## 2021-08-18 MED ORDER — LIDOCAINE HCL (PF) 1 % IJ SOLN: 5.0000 mL | INTRAMUSCULAR | Status: DC | PRN

## 2021-08-18 MED ORDER — LIDOCAINE-PRILOCAINE 2.5-2.5 % EX CREA
1.0000 "application " | TOPICAL_CREAM | CUTANEOUS | Status: DC | PRN
  Filled 2021-08-18: qty 5

## 2021-08-18 NOTE — Progress Notes (Signed)
Upper extremity vein mapping has been completed.   Preliminary results in CV Proc.   Lauren Osborn 08/18/2021 2:06 PM

## 2021-08-18 NOTE — Progress Notes (Signed)
[facility] ROUNDING NOTE   Subjective:   Interval History: 45 year old admitted with acute on chronic CHF. Echo 11/22  developed worsening AKI with attempted diuresis. CVVH was started in Nov and was stopped 12/8 . Patient now to receive intermittent HD.  Patient received dialysis 08/16/2021 with 2.5 L removed.  Baseline creatinine appears to be about 2 mg/dL  BP 88/56 pulse 69 temperature 97.2 O2 sats 96% room air  Minimal urine output 200 cc recorded 10/18/2020  Sodium 125 potassium 5 chloride 91 CO2 21 BUN 64 creatinine 4.15 glucose 95 calcium 9.5 phosphorus 4.4 albumin 4.1 hemoglobin 10.7   Objective:  Vital signs in last 24 hours:  Temp:  [97.2 F (36.2 C)-98.5 F (36.9 C)] 97.2 F (36.2 C) (12/15 0812) Pulse Rate:  [60-69] 64 (12/15 0812) Resp:  [12-22] 15 (12/15 1100) BP: (82-109)/(56-81) 88/56 (12/15 1100) SpO2:  [97 %-100 %] 100 % (12/15 0819) Weight:  [91.3 kg-94.2 kg] 94.2 kg (12/15 0812)  Weight change: 3.309 kg Filed Weights   08/17/21 0616 08/18/21 0446 08/18/21 0812  Weight: 89.5 kg 91.3 kg 94.2 kg    Intake/Output: I/O last 3 completed shifts: In: 315 [P.O.:720; IV Piggyback:150] Out: 250 [Urine:250]   Intake/Output this shift:  Total I/O In: 180 [P.O.:180] Out: -   CVS- RRR RS- CTA no wheezes  ABD- BS present soft non-distended EXT- no edema  temp dialysis catheter    Basic Metabolic Panel: Recent Labs  Lab 08/12/21 0500 08/13/21 0323 08/16/21 0111 08/16/21 0640 08/16/21 1243 08/17/21 0232 08/17/21 0945 08/18/21 0154  NA 129*   < > 126* 126* 132* 128* 127* 125*  K 4.0   < > 4.8 5.0 3.7 5.6* 4.5 5.0  CL 92*   < > 92* 90* 95* 93* 92* 91*  CO2 26   < > 22 23 25 24 24  21*  GLUCOSE 78   < > 89 79 124* 122* 91 95  BUN 35*   < > 67* 69* 31* 47* 53* 64*  CREATININE 2.48*   < > 4.43* 4.63* 2.72* 3.64* 4.08* 4.15*  CALCIUM 9.3   < > 9.4 9.8 9.3 9.5 9.5 9.5  MG 2.2  --   --   --   --   --   --   --   PHOS 3.2   < > 4.9* 5.1*  2.4* 4.0  --  4.4   < > = values in this interval not displayed.     Liver Function Tests: Recent Labs  Lab 08/16/21 0111 08/16/21 0640 08/16/21 1243 08/17/21 0232 08/18/21 0154  ALBUMIN 3.7 3.7 4.0 3.7 4.1    No results for input(s): LIPASE, AMYLASE in the last 168 hours. No results for input(s): AMMONIA in the last 168 hours.  CBC: Recent Labs  Lab 08/12/21 0729 08/16/21 0640 08/16/21 1243 08/18/21 0154  WBC 9.1 8.1 7.7 11.2*  HGB 10.5* 10.4* 10.5* 10.7*  HCT 32.1* 32.0* 32.5* 32.7*  MCV 97.6 97.3 97.9 97.3  PLT 173 214 205 257     Cardiac Enzymes: No results for input(s): CKTOTAL, CKMB, CKMBINDEX, TROPONINI in the last 168 hours.  BNP: Invalid input(s): POCBNP  CBG: No results for input(s): GLUCAP in the last 168 hours.  Microbiology: Results for orders placed or performed during the hospital encounter of 07/22/21  Resp Panel by RT-PCR (Flu A&B, Covid) Nasopharyngeal Swab     Status: None   Collection Time: 07/22/21  9:54 PM   Specimen: Nasopharyngeal Swab; Nasopharyngeal(NP)  swabs in vial transport medium  Result Value Ref Range Status   SARS Coronavirus 2 by RT PCR NEGATIVE NEGATIVE Final    Comment: (NOTE) SARS-CoV-2 target nucleic acids are NOT DETECTED.  The SARS-CoV-2 RNA is generally detectable in upper respiratory specimens during the acute phase of infection. The lowest concentration of SARS-CoV-2 viral copies this assay can detect is 138 copies/mL. A negative result does not preclude SARS-Cov-2 infection and should not be used as the sole basis for treatment or other patient management decisions. A negative result may occur with  improper specimen collection/handling, submission of specimen other than nasopharyngeal swab, presence of viral mutation(s) within the areas targeted by this assay, and inadequate number of viral copies(<138 copies/mL). A negative result must be combined with clinical observations, patient history, and  epidemiological information. The expected result is Negative.  Fact Sheet for Patients:  EntrepreneurPulse.com.au  Fact Sheet for Healthcare Providers:  IncredibleEmployment.be  This test is no t yet approved or cleared by the [place] FDA and  has been authorized for detection and/or diagnosis of SARS-CoV-2 by FDA under an Emergency Use Authorization (EUA). This EUA will remain  in effect (meaning this test can be used) for the duration of the COVID-19 declaration under Section 564(b)(1) of the Act, 21 U.S.C.section 360bbb-3(b)(1), unless the authorization is terminated  or revoked sooner.       Influenza A by PCR NEGATIVE NEGATIVE Final   Influenza B by PCR NEGATIVE NEGATIVE Final    Comment: (NOTE) The Xpert Xpress SARS-CoV-2/FLU/RSV plus assay is intended as an aid in the diagnosis of influenza from Nasopharyngeal swab specimens and should not be used as a sole basis for treatment. Nasal washings and aspirates are unacceptable for Xpert Xpress SARS-CoV-2/FLU/RSV testing.  Fact Sheet for Patients: EntrepreneurPulse.com.au  Fact Sheet for Healthcare Providers: IncredibleEmployment.be  This test is not yet approved or cleared by the [place] FDA and has been authorized for detection and/or diagnosis of SARS-CoV-2 by FDA under an Emergency Use Authorization (EUA). This EUA will remain in effect (meaning this test can be used) for the duration of the COVID-19 declaration under Section 564(b)(1) of the Act, 21 U.S.C. section 360bbb-3(b)(1), unless the authorization is terminated or revoked.  Performed at [facility], [address] [place], [place] [address]   MRSA Next Gen by PCR, Nasal     Status: None   Collection Time: 08/08/21  8:11 PM   Specimen: Nasal Mucosa; Nasal Swab  Result Value Ref Range Status   MRSA by PCR Next Gen NOT DETECTED NOT DETECTED Final    Comment: (NOTE) The  GeneXpert MRSA Assay (FDA approved for NASAL specimens only), is one component of a comprehensive MRSA colonization surveillance program. It is not intended to diagnose MRSA infection nor to guide or monitor treatment for MRSA infections. Test performance is not FDA approved in patients less than 49 years old. Performed at [facility], [place] [address], [place], [place] [address]     Coagulation Studies: No results for input(s): LABPROT, INR in the last 72 hours.  Urinalysis: No results for input(s): COLORURINE, LABSPEC, PHURINE, GLUCOSEU, HGBUR, BILIRUBINUR, KETONESUR, PROTEINUR, UROBILINOGEN, NITRITE, LEUKOCYTESUR in the last 72 hours.  Invalid input(s): APPERANCEUR    Imaging: No results found.   Medications:    sodium chloride     sodium chloride     albumin human 25 g (08/16/21 0640)    Chlorhexidine Gluconate Cloth  6 each Topical Q0600   Chlorhexidine Gluconate Cloth  6 each Topical Q0600   cholecalciferol  1,000 Units Oral Daily   feeding supplement  237 mL Oral BID BM   heparin injection (subcutaneous)  5,000 Units Subcutaneous Q8H   midodrine  15 mg Oral TID WC   multivitamin  1 tablet Oral QHS   pantoprazole  40 mg Oral Daily   predniSONE  20 mg Oral Q breakfast   senna-docusate  1 tablet Oral QHS   sodium chloride, sodium chloride, albumin human, alteplase, camphor-menthol, heparin, HYDROmorphone, lidocaine (PF), lidocaine-prilocaine, loratadine, ondansetron (ZOFRAN) IV, pentafluoroprop-tetrafluoroeth, polyethylene glycol  Assessment/ Plan:  Acute on Chronic Renal Failure  thought to be due to cardiorenal syndrome  Did receive CRRT until 12/8  now on IHD  with poor tolerability secondary to hypotension. Trying to cool dialysate although this may be problematic. Midodrine 15 mg tid.  Appeared to tolerate dialysis 08/16/2021 with 2.5 L removed.  Patient currently receiving dialysis 08/18/2021. ANEMIA- stable at this point - no ESA at present  MBD- stable   HTN/VOL removed 2.5 L 08/16/2021 interdialysis hypotension. No evidence of PE 11/21 ACCESS- Temp dialysis catheter   Acute on chronic diastolic heart failure  Liver Cirrhosis  - congestive hepatosteatosis . Does have some ascites on U/S in November. I wonder if this is contributing to her hypotension. We should work on clip process for outpatient dialysis.  Appreciate assistance from vein and vascular surgery   LOS: [place] @TODAY @11 :17 AM

## 2021-08-18 NOTE — Progress Notes (Signed)
Pt has been accepted at [facility] on MWF schedule. Pt will need to arrive at 6:00 for 6:15 chair time. Pt can start on 12/19 if pt stable for d/c over the weekend. Pt will need a covid test prior to d/c. Results will need to be faxed to clinic. Met with pt at bedside to provide out-pt arrangements. Information sheet provided to pt as well with arrangements noted. Will add to AVS as well. Update provided to attending and renal MD as well. Pt agreeable to arrangements. Will assist as needed.  Melven Sartorius Renal Navigator [phone]

## 2021-08-18 NOTE — H&P (View-Only) (Signed)
°  Daily Progress Note   Subjective: Pt needing fistula.  No complaints in dialysis earlier today  Objective: Vitals:   08/18/21 1223 08/18/21 1407  BP: 101/62 103/79  Pulse: 75 75  Resp:  19  Temp:  (!) 97.5 F (36.4 C)  SpO2: 97% 98%    Physical Examination  Palpable radial and ulnar arteries bilaterally  No sensory or motor deficits in the hands  ASSESSMENT/PLAN:   Vein mapping completed. Will discuss left brachiocephalic fistula. Plan for Left brachiocephalic fistula tomorrow Please make NPO midnight   Cassandria Santee MD [facility] [phone] 08/18/2021  3:56 PM

## 2021-08-18 NOTE — Progress Notes (Signed)
Patient ID: Lauren Osborn, female   DOB: [birth year], 45 y.o.   MRN: [id number]     Advanced Heart Failure Rounding Note  PCP-Cardiologist: Rozann Lesches, MD   Subjective:    [place] 11/22 w/ markedly elevated left and right filling pressure, mod pulmonary venous HTN and preserved CO.  She did not respond to maximal diuretics + milrinone 0.125, CVVH started.   Off CRRT 12/07 and iHD started.  Currently in dialysis.  SBP 90s-100s on midodrine, tolerates reasonably well.   Has been up walking halls without issue.   Recurrent gout pain yesterday and started on another prednisone burst.   RHC 07/26/21  Right Heart Pressures RHC Procedural Findings: Hemodynamics (mmHg) RA mean 35 RV 57/28, 35 PA 58/29, mean 46 PCWP mean 42  Oxygen saturations: PA 70% AO 99%  Cardiac Output (Fick) 7.04  Cardiac Index (Fick) 3.01 PVR < 1 WU PAPI < 1    Objective:   Weight Range: 94.2 kg Body mass index is 36.79 kg/m.   Vital Signs:   Temp:  [97.2 F (36.2 C)-98.5 F (36.9 C)] 97.2 F (36.2 C) (12/15 0812) Pulse Rate:  [60-69] 64 (12/15 0812) Resp:  [15-22] 15 (12/15 0830) BP: (91-109)/(61-81) 93/65 (12/15 0830) SpO2:  [97 %-100 %] 100 % (12/15 0819) Weight:  [91.3 kg-94.2 kg] 94.2 kg (12/15 0812) Last BM Date: 08/15/21  Weight change: Filed Weights   08/17/21 0616 08/18/21 0446 08/18/21 0812  Weight: 89.5 kg 91.3 kg 94.2 kg    Intake/Output:   Intake/Output Summary (Last 24 hours) at 08/18/2021 0855 Last data filed at 08/18/2021 0814 Gross per 24 hour  Intake 420 ml  Output --  Net 420 ml      Physical Exam   General: NAD Neck: JVP 10 cm, no thyromegaly or thyroid nodule.  Lungs: Clear to auscultation bilaterally with normal respiratory effort. CV: Nondisplaced PMI.  Heart regular S1/S2, no S3/S4, no murmur.  1+ ankle edema.    Abdomen: Soft, nontender, no hepatosplenomegaly, no distention.  Skin: Intact without lesions or rashes.  Neurologic: Alert and oriented x  3.  Psych: Normal affect. Extremities: No clubbing or cyanosis.  HEENT: Normal.    Telemetry   NSR 60s-70s (personally reviewed)   Labs    CBC Recent Labs    08/16/21 1243 08/18/21 0154  WBC 7.7 11.2*  HGB 10.5* 10.7*  HCT 32.5* 32.7*  MCV 97.9 97.3  PLT 205 725   Basic Metabolic Panel Recent Labs    08/17/21 0232 08/17/21 0945 08/18/21 0154  NA 128* 127* 125*  K 5.6* 4.5 5.0  CL 93* 92* 91*  CO2 24 24 21*  GLUCOSE 122* 91 95  BUN 47* 53* 64*  CREATININE 3.64* 4.08* 4.15*  CALCIUM 9.5 9.5 9.5  PHOS 4.0  --  4.4   Liver Function Tests Recent Labs    08/17/21 0232 08/18/21 0154  ALBUMIN 3.7 4.1   No results for input(s): LIPASE, AMYLASE in the last 72 hours. Cardiac Enzymes No results for input(s): CKTOTAL, CKMB, CKMBINDEX, TROPONINI in the last 72 hours.  BNP: BNP (last 3 results) Recent Labs    03/04/21 1222 06/23/21 1807 07/22/21 1755  BNP 543.0* 357.0* 510.0*    ProBNP (last 3 results) No results for input(s): PROBNP in the last 8760 hours.   D-Dimer No results for input(s): DDIMER in the last 72 hours. Hemoglobin A1C No results for input(s): HGBA1C in the last 72 hours. Fasting Lipid Panel No results for input(s): CHOL, HDL,  LDLCALC, TRIG, CHOLHDL, LDLDIRECT in the last 72 hours. Thyroid Function Tests No results for input(s): TSH, T4TOTAL, T3FREE, THYROIDAB in the last 72 hours.  Invalid input(s): FREET3  Other results:   Imaging    No results found.   Medications:     Scheduled Medications:  Chlorhexidine Gluconate Cloth  6 each Topical Q0600   Chlorhexidine Gluconate Cloth  6 each Topical Q0600   cholecalciferol  1,000 Units Oral Daily   feeding supplement  237 mL Oral BID BM   heparin injection (subcutaneous)  5,000 Units Subcutaneous Q8H   midodrine  15 mg Oral TID WC   multivitamin  1 tablet Oral QHS   pantoprazole  40 mg Oral Daily   predniSONE  20 mg Oral Q breakfast   senna-docusate  1 tablet Oral QHS     Infusions:  sodium chloride     sodium chloride     albumin human 25 g (08/16/21 0640)    PRN Medications: sodium chloride, sodium chloride, albumin human, alteplase, camphor-menthol, heparin, HYDROmorphone, lidocaine (PF), lidocaine-prilocaine, loratadine, ondansetron (ZOFRAN) IV, pentafluoroprop-tetrafluoroeth, polyethylene glycol    Assessment/Plan   1. Acute on chronic diastolic CHF: With prominent RV dysfunction.  Echo from 11/22 reviewed, EF 60-65%, normal RV systolic function with mild dilation, D-shaped septum consistent with RV pressure/volume overload. Patient is markedly volume overloaded with anasarca.  Unfortunately, patient developed worsening AKI with attempted diuresis. RHC  w/ markedly elevated left and right filling pressure, mod pulmonary venous HTN and preserved CO. PAPi low at 0.82. Minimal response to maximal diuretics and milrinone 0.125, CVVH started. Now off milrinone with stable co-ox.  Getting iHD successfully.  Still with some volume overload.  BP stable on midodrine.  - Continue midodrine 15 mg tid.  - Continue HD, would be as aggressive as possible for volume removal.  2. AKI on CKD stage IIIb: Baseline creatinine 1.9-2.3, bumped to 3.68 in the setting of severe volume overload.  Suspect cardiorenal syndrome. RHC w/ normal CO but low PAPI suggesting RV dysfunction.  She is anuric. Initially on CVVH, now on iHD.  Still volume overloaded. BP runs low but tolerating iHD with midodrine.  - Volume still up, continue HD (getting today).  - She now has catheter for long-term HD. - She will need fistula placement, discussed with Dr. Justin Mend and wants to place before she goes home.  Stable from cardiac perspective for vascular surgery.  - Will need outpatient dialysis seat.  3. Cirrhosis: Uncertain etiology, labs negative for viral hepatitis.  Not a heavy drinker.  May be due to NAFLD or RV failure.  4. OSA: Continue CPAP at night.  5. Hyponatremia: Managed with CVVHD.   6. Morbid obesity - Body mass index is 36.79 kg/m. 7. Left ankle pain: Patient has history of gout.  Recently restarted prednisone burst.  Ankle feels better.  - Continue prednisone burst for gout.  - Would benefit from allopurinol 100 daily once acute flare resolved, discussed dosing with pharmD.  8. Hyperkalemia: Managed with HD.   Continue to aggressively remove fluid with HD.  Discussed with Dr. Justin Mend, can go home once fistula placed.  Vascular surgery following, hopefully will be soon.   Length of Stay: [place], MD  08/18/2021, 8:55 AM  Advanced Heart Failure Team Pager [phone] (M-F; 7a - 5p)  Please contact [facility] for night-coverage after hours (5p -7a ) and weekends on [email or web address]

## 2021-08-18 NOTE — Progress Notes (Incomplete)
{  Select Note:[id number]} 

## 2021-08-18 NOTE — Progress Notes (Signed)
°  Daily Progress Note   Subjective: Pt needing fistula.  No complaints in dialysis earlier today  Objective: Vitals:   08/18/21 1223 08/18/21 1407  BP: 101/62 103/79  Pulse: 75 75  Resp:  19  Temp:  (!) 97.5 F (36.4 C)  SpO2: 97% 98%    Physical Examination  Palpable radial and ulnar arteries bilaterally  No sensory or motor deficits in the hands  ASSESSMENT/PLAN:   Vein mapping completed. Will discuss left brachiocephalic fistula. Plan for Left brachiocephalic fistula tomorrow Please make NPO midnight   Cassandria Santee MD [facility] [phone] 08/18/2021  3:56 PM

## 2021-08-19 ENCOUNTER — Inpatient Hospital Stay (HOSPITAL_COMMUNITY): Payer: Managed Care, Other (non HMO) | Admitting: Anesthesiology

## 2021-08-19 ENCOUNTER — Other Ambulatory Visit: Payer: Self-pay

## 2021-08-19 ENCOUNTER — Encounter (HOSPITAL_COMMUNITY): Payer: Self-pay | Admitting: Internal Medicine

## 2021-08-19 ENCOUNTER — Encounter (HOSPITAL_COMMUNITY)
Admission: EM | Disposition: A | Discharge status: Home Health | Payer: Self-pay | Source: Home / Self Care | Attending: Cardiology

## 2021-08-19 DIAGNOSIS — N186 End stage renal disease: Secondary | ICD-10-CM

## 2021-08-19 HISTORY — PX: AV FISTULA PLACEMENT: SHX1204

## 2021-08-19 LAB — RENAL FUNCTION PANEL
Albumin: 3.8 g/dL (ref 3.5–5.0)
Anion gap: 12 (ref 5–15)
BUN: 38 mg/dL — ABNORMAL HIGH (ref 6–20)
CO2: 24 mmol/L (ref 22–32)
Calcium: 9.4 mg/dL (ref 8.9–10.3)
Chloride: 93 mmol/L — ABNORMAL LOW (ref 98–111)
Creatinine, Ser: 3.04 mg/dL — ABNORMAL HIGH (ref 0.44–1.00)
GFR, Estimated: 18 mL/min — ABNORMAL LOW (ref >60)
Glucose, Bld: 88 mg/dL (ref 70–99)
Phosphorus: 4 mg/dL (ref 2.5–4.6)
Potassium: 4.9 mmol/L (ref 3.5–5.1)
Sodium: 129 mmol/L — ABNORMAL LOW (ref 135–145)

## 2021-08-19 LAB — SARS CORONAVIRUS 2 (TAT 6-24 HRS): SARS Coronavirus 2: NEGATIVE

## 2021-08-19 SURGERY — ARTERIOVENOUS (AV) FISTULA CREATION
Anesthesia: Regional | Site: Arm Upper | Laterality: Left

## 2021-08-19 MED ORDER — CHLORHEXIDINE GLUCONATE 0.12 % MT SOLN
OROMUCOSAL | Status: AC
  Administered 2021-08-19: 15 mL
  Filled 2021-08-19: qty 15

## 2021-08-19 MED ORDER — ONDANSETRON HCL 4 MG/2ML IJ SOLN
INTRAMUSCULAR | Status: AC
  Filled 2021-08-19: qty 2

## 2021-08-19 MED ORDER — MIDAZOLAM HCL 2 MG/2ML IJ SOLN: 2.0000 mg | Freq: Once | INTRAMUSCULAR | Status: AC

## 2021-08-19 MED ORDER — FENTANYL CITRATE (PF) 100 MCG/2ML IJ SOLN: 50.0000 ug | Freq: Once | INTRAMUSCULAR | Status: AC

## 2021-08-19 MED ORDER — 0.9 % SODIUM CHLORIDE (POUR BTL) OPTIME
TOPICAL | Status: DC | PRN
  Administered 2021-08-19: 500 mL

## 2021-08-19 MED ORDER — MIDAZOLAM HCL 2 MG/2ML IJ SOLN
INTRAMUSCULAR | Status: AC
  Filled 2021-08-19: qty 2

## 2021-08-19 MED ORDER — ORAL CARE MOUTH RINSE: 15.0000 mL | Freq: Once | OROMUCOSAL | Status: DC

## 2021-08-19 MED ORDER — MIDAZOLAM HCL 2 MG/2ML IJ SOLN
INTRAMUSCULAR | Status: AC
  Administered 2021-08-19: 2 mg via INTRAVENOUS
  Filled 2021-08-19: qty 2

## 2021-08-19 MED ORDER — FENTANYL CITRATE (PF) 100 MCG/2ML IJ SOLN
INTRAMUSCULAR | Status: AC
  Administered 2021-08-19: 50 ug via INTRAVENOUS
  Filled 2021-08-19: qty 2

## 2021-08-19 MED ORDER — CEFAZOLIN SODIUM-DEXTROSE 2-3 GM-%(50ML) IV SOLR
INTRAVENOUS | Status: DC | PRN
  Administered 2021-08-19: 2 g via INTRAVENOUS

## 2021-08-19 MED ORDER — ROPIVACAINE HCL 5 MG/ML IJ SOLN
INTRAMUSCULAR | Status: DC | PRN
  Administered 2021-08-19: 30 mL via PERINEURAL

## 2021-08-19 MED ORDER — PROPOFOL 10 MG/ML IV BOLUS
INTRAVENOUS | Status: DC | PRN
  Administered 2021-08-19: 30 mg via INTRAVENOUS

## 2021-08-19 MED ORDER — CHLORHEXIDINE GLUCONATE 0.12 % MT SOLN: 15.0000 mL | Freq: Once | OROMUCOSAL | Status: DC

## 2021-08-19 MED ORDER — SODIUM CHLORIDE 0.9 % IV SOLN: INTRAVENOUS | Status: DC | PRN

## 2021-08-19 MED ORDER — FENTANYL CITRATE (PF) 100 MCG/2ML IJ SOLN: 25.0000 ug | INTRAMUSCULAR | Status: DC | PRN

## 2021-08-19 MED ORDER — PROPOFOL 10 MG/ML IV BOLUS
INTRAVENOUS | Status: AC
  Filled 2021-08-19: qty 20

## 2021-08-19 MED ORDER — LIDOCAINE 2% (20 MG/ML) 5 ML SYRINGE
INTRAMUSCULAR | Status: AC
  Filled 2021-08-19: qty 5

## 2021-08-19 MED ORDER — ONDANSETRON HCL 4 MG/2ML IJ SOLN
INTRAMUSCULAR | Status: DC | PRN
  Administered 2021-08-19: 4 mg via INTRAVENOUS

## 2021-08-19 MED ORDER — FENTANYL CITRATE (PF) 250 MCG/5ML IJ SOLN
INTRAMUSCULAR | Status: AC
  Filled 2021-08-19: qty 5

## 2021-08-19 MED ORDER — DEXAMETHASONE SODIUM PHOSPHATE 10 MG/ML IJ SOLN
INTRAMUSCULAR | Status: AC
  Filled 2021-08-19: qty 1

## 2021-08-19 MED ORDER — PROPOFOL 500 MG/50ML IV EMUL
INTRAVENOUS | Status: DC | PRN
  Administered 2021-08-19: 50 ug/kg/min via INTRAVENOUS

## 2021-08-19 MED ORDER — HEPARIN 6000 UNIT IRRIGATION SOLUTION
Status: DC | PRN
  Administered 2021-08-19: 1

## 2021-08-19 MED ORDER — CHLORHEXIDINE GLUCONATE CLOTH 2 % EX PADS
6.0000 | MEDICATED_PAD | Freq: Every day | CUTANEOUS | Status: DC
  Administered 2021-08-19: 6 via TOPICAL

## 2021-08-19 SURGICAL SUPPLY — 35 items
ADH SKN CLS APL DERMABOND .7 (GAUZE/BANDAGES/DRESSINGS) ×1
ARMBAND PINK RESTRICT EXTREMIT (MISCELLANEOUS) ×6 IMPLANT
BAG COUNTER SPONGE SURGICOUNT (BAG) ×3 IMPLANT
BAG SPNG CNTER NS LX DISP (BAG) ×1
CANISTER SUCT 3000ML PPV (MISCELLANEOUS) ×3 IMPLANT
CLIP TI MEDIUM 6 (CLIP) ×1 IMPLANT
CLIP TI WIDE RED SMALL 6 (CLIP) ×1 IMPLANT
CLIP VESOCCLUDE MED 6/CT (CLIP) ×3 IMPLANT
CLIP VESOCCLUDE SM WIDE 6/CT (CLIP) ×3 IMPLANT
COVER PROBE W GEL 5X96 (DRAPES) ×4 IMPLANT
DERMABOND ADVANCED (GAUZE/BANDAGES/DRESSINGS) ×1
DERMABOND ADVANCED .7 DNX12 (GAUZE/BANDAGES/DRESSINGS) ×2 IMPLANT
ELECT REM PT RETURN 9FT ADLT (ELECTROSURGICAL) ×2
ELECTRODE REM PT RTRN 9FT ADLT (ELECTROSURGICAL) ×2 IMPLANT
GLOVE SRG 8 PF TXTR STRL LF DI (GLOVE) ×2 IMPLANT
GLOVE SURG POLYISO LF SZ7.5 (GLOVE) ×3 IMPLANT
GLOVE SURG UNDER POLY LF SZ8 (GLOVE) ×2
GOWN STRL REUS W/ TWL LRG LVL3 (GOWN DISPOSABLE) ×4 IMPLANT
GOWN STRL REUS W/ TWL XL LVL3 (GOWN DISPOSABLE) ×2 IMPLANT
GOWN STRL REUS W/TWL LRG LVL3 (GOWN DISPOSABLE) ×4
GOWN STRL REUS W/TWL XL LVL3 (GOWN DISPOSABLE) ×2
HEMOSTAT SNOW SURGICEL 2X4 (HEMOSTASIS) IMPLANT
KIT BASIN OR (CUSTOM PROCEDURE TRAY) ×3 IMPLANT
KIT TURNOVER KIT B (KITS) ×3 IMPLANT
NS IRRIG 1000ML POUR BTL (IV SOLUTION) ×3 IMPLANT
PACK CV ACCESS (CUSTOM PROCEDURE TRAY) ×3 IMPLANT
PAD ARMBOARD 7.5X6 YLW CONV (MISCELLANEOUS) ×6 IMPLANT
SUT PROLENE 6 0 BV (SUTURE) ×1 IMPLANT
SUT PROLENE 6 0 CC (SUTURE) ×3 IMPLANT
SUT VIC AB 3-0 SH 27 (SUTURE) ×2
SUT VIC AB 3-0 SH 27X BRD (SUTURE) ×2 IMPLANT
SUT VICRYL 4-0 PS2 18IN ABS (SUTURE) ×3 IMPLANT
TOWEL GREEN STERILE (TOWEL DISPOSABLE) ×3 IMPLANT
UNDERPAD 30X36 HEAVY ABSORB (UNDERPADS AND DIAPERS) ×3 IMPLANT
WATER STERILE IRR 1000ML POUR (IV SOLUTION) ×3 IMPLANT

## 2021-08-19 NOTE — Anesthesia Preprocedure Evaluation (Signed)
Anesthesia Evaluation  Patient identified by MRN, date of birth, ID band Patient awake    Reviewed: Allergy & Precautions, NPO status , Patient's Chart, lab work & pertinent test results  Airway Mallampati: II  TM Distance: >3 FB Neck ROM: Full    Dental no notable dental hx. (+) Teeth Intact   Pulmonary shortness of breath, with exertion, at rest and lying, sleep apnea , former smoker,    Pulmonary exam normal breath sounds clear to auscultation       Cardiovascular hypertension, Pt. on medications +CHF  Normal cardiovascular exam Rhythm:Regular Rate:Normal     Neuro/Psych negative neurological ROS  negative psych ROS   GI/Hepatic GERD  Medicated and Controlled,  Endo/Other  Obesity  Renal/GU ESRF and DialysisRenal disease  negative genitourinary   Musculoskeletal negative musculoskeletal ROS (+)   Abdominal (+) + obese,   Peds  Hematology  (+) anemia ,   Anesthesia Other Findings   Reproductive/Obstetrics                             Anesthesia Physical Anesthesia Plan  ASA: 4  Anesthesia Plan: Regional   Post-op Pain Management: Regional block   Induction: Intravenous  PONV Risk Score and Plan: 4 or greater and Treatment may vary due to age or medical condition, Ondansetron and Midazolam  Airway Management Planned: Natural Airway  Additional Equipment:   Intra-op Plan:   Post-operative Plan:   Informed Consent: I have reviewed the patients History and Physical, chart, labs and discussed the procedure including the risks, benefits and alternatives for the proposed anesthesia with the patient or authorized representative who has indicated his/her understanding and acceptance.     Dental advisory given  Plan Discussed with: CRNA and Anesthesiologist  Anesthesia Plan Comments:         Anesthesia Quick Evaluation

## 2021-08-19 NOTE — Anesthesia Postprocedure Evaluation (Signed)
Anesthesia Post Note  Patient: [name]  Procedure(s) Performed: LEFT ARM ARTERIOVENOUS (AV) FISTULA (Left: Arm Upper)     Patient location during evaluation: PACU Anesthesia Type: Regional and MAC Level of consciousness: awake and alert and oriented Pain management: pain level controlled Vital Signs Assessment: post-procedure vital signs reviewed and stable Respiratory status: spontaneous breathing, nonlabored ventilation, respiratory function stable and patient connected to nasal cannula oxygen Cardiovascular status: stable and blood pressure returned to baseline Postop Assessment: no apparent nausea or vomiting Anesthetic complications: no   No notable events documented.  Last Vitals:  Vitals:   08/19/21 1625 08/19/21 1647  BP: 92/62   Pulse: 67   Resp: 20 20  Temp: 36.6 C 36.8 C  SpO2: 99% 99%    Last Pain:  Vitals:   08/19/21 1647  TempSrc: Oral  PainSc: 0-No pain                 Skylynne Schlechter A.

## 2021-08-19 NOTE — Progress Notes (Signed)
Patient ID: Lauren Osborn, female   DOB: 1976-07-30, 45 y.o.   MRN: [id number]     Advanced Heart Failure Rounding Note  PCP-Cardiologist: Rozann Lesches, MD   Subjective:    [place] 11/22 w/ markedly elevated left and right filling pressure, mod pulmonary venous HTN and preserved CO.  She did not respond to maximal diuretics + milrinone 0.125, CVVH started.   Off CRRT 12/07 and iHD started.  SBP 90s-100s on midodrine, has been doing fine with HD, had yesterday.   Has been up walking halls without issue.   Recurrent gout pain yesterday and started on another prednisone burst.   RHC 07/26/21  Right Heart Pressures RHC Procedural Findings: Hemodynamics (mmHg) RA mean 35 RV 57/28, 35 PA 58/29, mean 46 PCWP mean 42  Oxygen saturations: PA 70% AO 99%  Cardiac Output (Fick) 7.04  Cardiac Index (Fick) 3.01 PVR < 1 WU PAPI < 1    Objective:   Weight Range: 90.7 kg Body mass index is 35.41 kg/m.   Vital Signs:   Temp:  [97.5 F (36.4 C)-98.9 F (37.2 C)] 98.9 F (37.2 C) (12/16 0417) Pulse Rate:  [68-77] 77 (12/16 0417) Resp:  [12-31] 19 (12/15 1407) BP: (82-109)/(56-79) 92/67 (12/16 0417) SpO2:  [97 %-100 %] 97 % (12/15 2008) Weight:  [90.7 kg-92.2 kg] 90.7 kg (12/16 0417) Last BM Date: 08/15/21  Weight change: Filed Weights   08/18/21 0812 08/18/21 1227 08/19/21 0417  Weight: 94.2 kg 92.2 kg 90.7 kg    Intake/Output:   Intake/Output Summary (Last 24 hours) at 08/19/2021 1006 Last data filed at 08/18/2021 2100 Gross per 24 hour  Intake 240 ml  Output 1603 ml  Net -1363 ml      Physical Exam   General: NAD Neck: JVP 10-12 cm, no thyromegaly or thyroid nodule.  Lungs: Clear to auscultation bilaterally with normal respiratory effort. CV: Nondisplaced PMI.  Heart regular S1/S2, no S3/S4, no murmur.  1+ ankle edema.   Abdomen: Soft, nontender, no hepatosplenomegaly, no distention.  Skin: Intact without lesions or rashes.  Neurologic: Alert and oriented x  3.  Psych: Normal affect. Extremities: No clubbing or cyanosis.  HEENT: Normal.    Telemetry   NSR 60s-70s (personally reviewed)   Labs    CBC Recent Labs    08/16/21 1243 08/18/21 0154  WBC 7.7 11.2*  HGB 10.5* 10.7*  HCT 32.5* 32.7*  MCV 97.9 97.3  PLT 205 937   Basic Metabolic Panel Recent Labs    08/18/21 0154 08/19/21 0310  NA 125* 129*  K 5.0 4.9  CL 91* 93*  CO2 21* 24  GLUCOSE 95 88  BUN 64* 38*  CREATININE 4.15* 3.04*  CALCIUM 9.5 9.4  PHOS 4.4 4.0   Liver Function Tests Recent Labs    08/18/21 0154 08/19/21 0310  ALBUMIN 4.1 3.8   No results for input(s): LIPASE, AMYLASE in the last 72 hours. Cardiac Enzymes No results for input(s): CKTOTAL, CKMB, CKMBINDEX, TROPONINI in the last 72 hours.  BNP: BNP (last 3 results) Recent Labs    03/04/21 1222 06/23/21 1807 07/22/21 1755  BNP 543.0* 357.0* 510.0*    ProBNP (last 3 results) No results for input(s): PROBNP in the last 8760 hours.   D-Dimer No results for input(s): DDIMER in the last 72 hours. Hemoglobin A1C No results for input(s): HGBA1C in the last 72 hours. Fasting Lipid Panel No results for input(s): CHOL, HDL, LDLCALC, TRIG, CHOLHDL, LDLDIRECT in the last 72 hours. Thyroid Function Tests  No results for input(s): TSH, T4TOTAL, T3FREE, THYROIDAB in the last 72 hours.  Invalid input(s): FREET3  Other results:   Imaging    VAS [place] UPPER EXT VEIN MAPPING (PRE-OP AVF)  Result Date: 08/18/2021 UPPER EXTREMITY VEIN MAPPING Patient Name:  [name]  Date of Exam:   08/18/2021 Medical Rec #: [id number]       Accession #:    [id number] Date of Birth: 10/11/1975       Patient Gender: F Patient Age:   74 years Exam Location:  [facility] Procedure:      VAS [place] UPPER EXT VEIN MAPPING (PRE-OP AVF) Referring Phys: Edrick Oh --------------------------------------------------------------------------------  Indications: Pre-access. Comparison Study: no prior Performing  Technologist: Archie Patten RVS  Examination Guidelines: A complete evaluation includes B-mode imaging, spectral Doppler, color Doppler, and power Doppler as needed of all accessible portions of each vessel. Bilateral testing is considered an integral part of a complete examination. Limited examinations for reoccurring indications may be performed as noted. +-----------------+-------------+----------+--------+  Right Cephalic    Diameter (cm) Depth (cm) Findings  +-----------------+-------------+----------+--------+  Shoulder              0.34         0.57              +-----------------+-------------+----------+--------+  Prox upper arm        0.35         0.67              +-----------------+-------------+----------+--------+  Mid upper arm         0.33         0.94              +-----------------+-------------+----------+--------+  Dist upper arm        0.32         0.66              +-----------------+-------------+----------+--------+  Antecubital fossa     0.53         0.16              +-----------------+-------------+----------+--------+  Prox forearm          0.32         0.40              +-----------------+-------------+----------+--------+  Mid forearm           0.26         0.35              +-----------------+-------------+----------+--------+  Dist forearm          0.29         0.38              +-----------------+-------------+----------+--------+  Wrist                 0.23         0.25              +-----------------+-------------+----------+--------+ +-----------------+-------------+----------+---------+  Right Basilic     Diameter (cm) Depth (cm) Findings   +-----------------+-------------+----------+---------+  Prox upper arm        0.57         0.82               +-----------------+-------------+----------+---------+  Mid upper arm         0.51         0.75               +-----------------+-------------+----------+---------+  Dist upper arm        0.54         0.79                +-----------------+-------------+----------+---------+  Antecubital fossa     0.52         0.98               +-----------------+-------------+----------+---------+  Prox forearm          0.28         0.21    branching  +-----------------+-------------+----------+---------+  Mid forearm           0.27         0.10               +-----------------+-------------+----------+---------+  Distal forearm        0.20         0.11               +-----------------+-------------+----------+---------+  Wrist                 0.15         0.15               +-----------------+-------------+----------+---------+ +-----------------+-------------+----------+--------+  Left Cephalic     Diameter (cm) Depth (cm) Findings  +-----------------+-------------+----------+--------+  Shoulder              0.43         0.82              +-----------------+-------------+----------+--------+  Prox upper arm        0.41         1.16              +-----------------+-------------+----------+--------+  Mid upper arm         0.41         1.09              +-----------------+-------------+----------+--------+  Dist upper arm        0.40         1.05              +-----------------+-------------+----------+--------+  Antecubital fossa     0.40         0.31              +-----------------+-------------+----------+--------+  Prox forearm          0.36         0.52              +-----------------+-------------+----------+--------+  Mid forearm           0.35         0.40              +-----------------+-------------+----------+--------+  Dist forearm          0.41         0.22              +-----------------+-------------+----------+--------+  Wrist                 0.21         0.34              +-----------------+-------------+----------+--------+ +-----------------+-------------+----------+---------+  Left Basilic      Diameter (cm) Depth (cm) Findings   +-----------------+-------------+----------+---------+  Prox upper arm        0.64         0.86                +-----------------+-------------+----------+---------+  Mid upper arm         0.64         0.92               +-----------------+-------------+----------+---------+  Dist upper arm        0.64         1.01               +-----------------+-------------+----------+---------+  Antecubital fossa     0.60         0.68               +-----------------+-------------+----------+---------+  Prox forearm          0.32         0.43    branching  +-----------------+-------------+----------+---------+  Mid forearm           0.29         0.20               +-----------------+-------------+----------+---------+  Distal forearm        0.21         0.30               +-----------------+-------------+----------+---------+  Wrist                 0.15         0.21               +-----------------+-------------+----------+---------+ *See table(s) above for measurements and observations.  Diagnosing physician: Monica Martinez MD Electronically signed by Monica Martinez MD on 08/18/2021 at 7:54:34 PM.    Final      Medications:     Scheduled Medications:  Chlorhexidine Gluconate Cloth  6 each Topical Q0600   Chlorhexidine Gluconate Cloth  6 each Topical Q0600   cholecalciferol  1,000 Units Oral Daily   feeding supplement  237 mL Oral BID BM   heparin injection (subcutaneous)  5,000 Units Subcutaneous Q8H   midodrine  15 mg Oral TID WC   multivitamin  1 tablet Oral QHS   pantoprazole  40 mg Oral Daily   predniSONE  20 mg Oral Q breakfast   senna-docusate  1 tablet Oral QHS    Infusions:  albumin human 25 g (08/16/21 0640)    PRN Medications: albumin human, camphor-menthol, HYDROmorphone, loratadine, ondansetron (ZOFRAN) IV, polyethylene glycol    Assessment/Plan   1. Acute on chronic diastolic CHF: With prominent RV dysfunction.  Echo from 11/22 reviewed, EF 60-65%, normal RV systolic function with mild dilation, D-shaped septum consistent with RV pressure/volume overload. Patient is markedly  volume overloaded with anasarca.  Unfortunately, patient developed worsening AKI with attempted diuresis. RHC  w/ markedly elevated left and right filling pressure, mod pulmonary venous HTN and preserved CO. PAPi low at 0.82. Minimal response to maximal diuretics and milrinone 0.125, CVVH started. Now off milrinone with stable co-ox.  Getting iHD successfully.  Still with some volume overload.  BP stable on midodrine.  - Continue midodrine 15 mg tid.  - Continue HD, would be as aggressive as possible for volume removal.  Would like her to get HD today or tomorrow.  2. AKI on CKD stage IIIb: Baseline creatinine 1.9-2.3, bumped to 3.68 in the setting of severe volume overload.  Suspect cardiorenal syndrome. RHC w/ normal CO but low PAPI suggesting RV dysfunction.  She is anuric. Initially on CVVH, now on iHD.  Still volume overloaded. BP runs low but tolerating iHD with midodrine.  - Volume still up,  continue HD, ideally today or tomorrow.   - She now has catheter for long-term HD. - Getting AV fistula placed today.  - She has an outpatient HD seat in [place] for MWF, would like to get her home over weekend.  3. Cirrhosis: Uncertain etiology, labs negative for viral hepatitis.  Not a heavy drinker.  May be due to NAFLD or RV failure.  4. OSA: Continue CPAP at night.  5. Hyponatremia: Managed with CVVHD.  6. Morbid obesity - Body mass index is 35.41 kg/m. 7. Left ankle pain: Patient has history of gout.  Recently restarted prednisone burst.  Ankle feels better.  - Continue prednisone burst for gout.  - Would benefit from allopurinol 100 daily once acute flare resolved, discussed dosing with pharmD.  8. Hyperkalemia: Managed with HD.   Has outpatient HD seat MWF, would like to get her home over weekend but will need HD prior.   Length of Stay: [place], MD  08/19/2021, 10:06 AM  Advanced Heart Failure Team Pager [phone] (M-F; 7a - 5p)  Please contact [facility] for night-coverage  after hours (5p -7a ) and weekends on [email or web address]

## 2021-08-19 NOTE — Op Note (Signed)
° ° °  Patient name: Lauren Osborn MRN: [id number] DOB: 07-19-1976 Sex: female  08/19/2021 Pre-operative Diagnosis: End-stage renal disease Post-operative diagnosis:  Same Surgeon:  Annamarie Major Assistants:  Laurence Slate, PA Procedure:   Left brachiocephalic fistula Anesthesia:  Regional Blood Loss:  minimal Specimens:  none  Findings: 3-4 mm cephalic vein.  3 mm brachial artery  Indications: This is a 45 year old female in need of permanent access.  She had adequate vein mapping on the left.  Details of the procedure and risks and benefits were discussed in the holding area.  She wished to proceed.  All questions answered.  Procedure:  The patient was identified in the holding area and taken to [place] 11  The patient was then placed supine on the table. regional anesthesia was administered.  The patient was prepped and draped in the usual sterile fashion.  A time out was called and antibiotics were administered.  A PA was necessary to expedite the procedure and assist with technical details.  Ultrasound was used to evaluate the cephalic vein in the upper arm.  It was of adequate caliber for fistula creation.  A transverse incision was made just proximal to the antecubital crease.  Cautery was used divide subcutaneous tissue cautery and sharp dissection were used to expose the brachial artery which was a 3 mm disease-free artery.  Next I exposed the median cubital vein which was a 3-4 mm vein.  There was minimal scarring around the vein.  I traced it back to the junction of the cephalic vein and also dissected out the distal cephalic vein.  I then marked the vein for orientation.  Both the median cubital and cephalic vein were ligated distally.  When I distended the veins, the median cubital vein was the better of the 2 and so the c distal cephalic vein was ligated.  Next, the brachial artery was occluded with vascular clamps.  A #11 blade was used to make an arteriotomy which was extended  longitudinally with Potts scissors.  The vein was cut the appropriate length and spatulated that the size the arteriotomy.  A running anastomosis was performed with 6-0 Prolene.  Prior to completion the appropriate flushing maneuvers were performed and the anastomosis was completed.  I inspected the course of the vein to make sure there were no kinks.  There was a palpable thrill within the fistula.  The patient had a multiphasic radial and ulnar artery Doppler signals.  Next hemostasis was achieved.  The subcutaneous tissue was closed with 3-0 Vicryl followed by subcuticular closure.  Dermabond was applied.  Disposition: To PACU stable     V. Annamarie Major, M.D., [facility] [place] Office: [phone] Pager:  [phone]

## 2021-08-19 NOTE — TOC Progression Note (Signed)
Transition of Care [facility]) - Progression Note    Patient Details  Name: Lauren Osborn MRN: [id number] Date of Birth: 12/26/1975  [facility]) CM/SW Contact  Graves-Bigelow, Ocie Cornfield, RN Phone Number: 08/19/2021, 3:46 PM  Clinical Narrative:   Case Manager reached out to [facility] Liaison to make her aware that the patient may transition home soon over the weekend. Patient will need HH RN/PT orders with [facility] once stable. Orders placed in Epic for DME shower chair and referral called to [employer] for delivery. DME will be delivered to the room prior to transition home. Case Manager will continue to follow for additional transition of care needs.   Expected Discharge Plan: [place] Barriers to Discharge: No Barriers Identified  Expected Discharge Plan and Services Expected Discharge Plan: [place] In-house Referral: Clinical Social Work Discharge Planning Services: CM Consult Post Acute Care Choice: [place] arrangements for the past 2 months: Single Family Home                 DME Arranged: Shower stool DME Agency: [employer] Date DME Agency Contacted: 08/19/21 Time DME Agency Contacted: [phone] Representative spoke with at DME Agency: Freda Munro HH Arranged: RN, PT [place] Agency: [place] Date [place]: 08/19/21 Time [place]: 63 Representative spoke with at [place]: [place] Determinants of Health ([place]) Interventions Food Insecurity Interventions: Intervention Not Indicated Financial Strain Interventions: Intervention Not Indicated Housing Interventions: Intervention Not Indicated Transportation Interventions: Intervention Not Indicated  Readmission Risk Interventions Readmission Risk Prevention Plan 07/23/2021 06/24/2021  Transportation Screening Complete Complete  Home Care Screening Complete Complete  Medication Review (RN CM) Complete Complete  Some recent data might be  hidden

## 2021-08-19 NOTE — Transfer of Care (Signed)
Immediate Anesthesia Transfer of Care Note  Patient: Lauren Osborn  Procedure(s) Performed: LEFT ARM ARTERIOVENOUS (AV) FISTULA (Left: Arm Upper)  Patient Location: PACU  Anesthesia Type:MAC and Regional  Level of Consciousness: awake, alert  and oriented  Airway & Oxygen Therapy: Patient Spontanous Breathing  Post-op Assessment: Report given to RN and Post -op Vital signs reviewed and stable  Post vital signs: Reviewed and stable  Last Vitals:  Vitals Value Taken Time  BP 84/65 08/19/21 1610  Temp    Pulse 61 08/19/21 1613  Resp 18 08/19/21 1613  SpO2 98 % 08/19/21 1613  Vitals shown include unvalidated device data.  Last Pain:  Vitals:   08/19/21 1359  TempSrc:   PainSc: 0-No pain      Patients Stated Pain Goal: 0 (66/29/47 6546)  Complications: No notable events documented.

## 2021-08-19 NOTE — Progress Notes (Signed)
[facility] ROUNDING NOTE   Subjective:   Interval History: 45 year old admitted with acute on chronic CHF. Echo 11/22  developed worsening AKI with attempted diuresis. CVVH was started in Nov and was stopped 12/8 . Patient now to receive intermittent HD.  Patient received dialysis 08/18/2021 1.6 L removed Next dialysis will be 08/20/2021  BP 96/71 pulse 78 temperature 98.9  Minimal urine output 200 cc recorded 10/18/2020  Sodium 129 potassium 4.9 chloride 93 CO2 24 BUN 38 creatinine 3 glucose 88 calcium 9.4 phosphorus 4 albumin 3.8 hemoglobin 10.7   Objective:  Vital signs in last 24 hours:  Temp:  [97.5 F (36.4 C)-98.9 F (37.2 C)] 98.9 F (37.2 C) (12/16 0417) Pulse Rate:  [68-77] 77 (12/16 0417) Resp:  [15-31] 19 (12/15 1407) BP: (88-109)/(56-79) 92/67 (12/16 0417) SpO2:  [97 %-100 %] 97 % (12/15 2008) Weight:  [90.7 kg-92.2 kg] 90.7 kg (12/16 0417)  Weight change: 2.891 kg Filed Weights   08/18/21 0812 08/18/21 1227 08/19/21 0417  Weight: 94.2 kg 92.2 kg 90.7 kg    Intake/Output: I/O last 3 completed shifts: In: 660 [P.O.:660] Out: 1603 [FMBWG:6659]   Intake/Output this shift:  No intake/output data recorded.  CVS- RRR RS- CTA no wheezes  ABD- BS present soft non-distended EXT- no edema  temp dialysis catheter    Basic Metabolic Panel: Recent Labs  Lab 08/16/21 0640 08/16/21 1243 08/17/21 0232 08/17/21 0945 08/18/21 0154 08/19/21 0310  NA 126* 132* 128* 127* 125* 129*  K 5.0 3.7 5.6* 4.5 5.0 4.9  CL 90* 95* 93* 92* 91* 93*  CO2 23 25 24 24  21* 24  GLUCOSE 79 124* 122* 91 95 88  BUN 69* 31* 47* 53* 64* 38*  CREATININE 4.63* 2.72* 3.64* 4.08* 4.15* 3.04*  CALCIUM 9.8 9.3 9.5 9.5 9.5 9.4  PHOS 5.1* 2.4* 4.0  --  4.4 4.0     Liver Function Tests: Recent Labs  Lab 08/16/21 0640 08/16/21 1243 08/17/21 0232 08/18/21 0154 08/19/21 0310  ALBUMIN 3.7 4.0 3.7 4.1 3.8    No results for input(s): LIPASE, AMYLASE in the last 168  hours. No results for input(s): AMMONIA in the last 168 hours.  CBC: Recent Labs  Lab 08/16/21 0640 08/16/21 1243 08/18/21 0154  WBC 8.1 7.7 11.2*  HGB 10.4* 10.5* 10.7*  HCT 32.0* 32.5* 32.7*  MCV 97.3 97.9 97.3  PLT 214 205 257     Cardiac Enzymes: No results for input(s): CKTOTAL, CKMB, CKMBINDEX, TROPONINI in the last 168 hours.  BNP: Invalid input(s): POCBNP  CBG: No results for input(s): GLUCAP in the last 168 hours.  Microbiology: Results for orders placed or performed during the hospital encounter of 07/22/21  Resp Panel by RT-PCR (Flu A&B, Covid) Nasopharyngeal Swab     Status: None   Collection Time: 07/22/21  9:54 PM   Specimen: Nasopharyngeal Swab; Nasopharyngeal(NP) swabs in vial transport medium  Result Value Ref Range Status   SARS Coronavirus 2 by RT PCR NEGATIVE NEGATIVE Final    Comment: (NOTE) SARS-CoV-2 target nucleic acids are NOT DETECTED.  The SARS-CoV-2 RNA is generally detectable in upper respiratory specimens during the acute phase of infection. The lowest concentration of SARS-CoV-2 viral copies this assay can detect is 138 copies/mL. A negative result does not preclude SARS-Cov-2 infection and should not be used as the sole basis for treatment or other patient management decisions. A negative result may occur with  improper specimen collection/handling, submission of specimen other than nasopharyngeal swab, presence  of viral mutation(s) within the areas targeted by this assay, and inadequate number of viral copies(<138 copies/mL). A negative result must be combined with clinical observations, patient history, and epidemiological information. The expected result is Negative.  Fact Sheet for Patients:  EntrepreneurPulse.com.au  Fact Sheet for Healthcare Providers:  IncredibleEmployment.be  This test is no t yet approved or cleared by the [place] FDA and  has been authorized for detection and/or  diagnosis of SARS-CoV-2 by FDA under an Emergency Use Authorization (EUA). This EUA will remain  in effect (meaning this test can be used) for the duration of the COVID-19 declaration under Section 564(b)(1) of the Act, 21 U.S.C.section 360bbb-3(b)(1), unless the authorization is terminated  or revoked sooner.       Influenza A by PCR NEGATIVE NEGATIVE Final   Influenza B by PCR NEGATIVE NEGATIVE Final    Comment: (NOTE) The Xpert Xpress SARS-CoV-2/FLU/RSV plus assay is intended as an aid in the diagnosis of influenza from Nasopharyngeal swab specimens and should not be used as a sole basis for treatment. Nasal washings and aspirates are unacceptable for Xpert Xpress SARS-CoV-2/FLU/RSV testing.  Fact Sheet for Patients: EntrepreneurPulse.com.au  Fact Sheet for Healthcare Providers: IncredibleEmployment.be  This test is not yet approved or cleared by the [place] FDA and has been authorized for detection and/or diagnosis of SARS-CoV-2 by FDA under an Emergency Use Authorization (EUA). This EUA will remain in effect (meaning this test can be used) for the duration of the COVID-19 declaration under Section 564(b)(1) of the Act, 21 U.S.C. section 360bbb-3(b)(1), unless the authorization is terminated or revoked.  Performed at [facility], [address] [place], [place] [address]   MRSA Next Gen by PCR, Nasal     Status: None   Collection Time: 08/08/21  8:11 PM   Specimen: Nasal Mucosa; Nasal Swab  Result Value Ref Range Status   MRSA by PCR Next Gen NOT DETECTED NOT DETECTED Final    Comment: (NOTE) The GeneXpert MRSA Assay (FDA approved for NASAL specimens only), is one component of a comprehensive MRSA colonization surveillance program. It is not intended to diagnose MRSA infection nor to guide or monitor treatment for MRSA infections. Test performance is not FDA approved in patients less than 31 years old. Performed at [facility], [place] [address], [place], [place] [address]     Coagulation Studies: No results for input(s): LABPROT, INR in the last 72 hours.  Urinalysis: No results for input(s): COLORURINE, LABSPEC, PHURINE, GLUCOSEU, HGBUR, BILIRUBINUR, KETONESUR, PROTEINUR, UROBILINOGEN, NITRITE, LEUKOCYTESUR in the last 72 hours.  Invalid input(s): APPERANCEUR    Imaging: VAS [place] UPPER EXT VEIN MAPPING (PRE-OP AVF)  Result Date: 08/18/2021 UPPER EXTREMITY VEIN MAPPING Patient Name:  KALEEYAH CUFFIE  Date of Exam:   08/18/2021 Medical Rec #: [id number]       Accession #:    [id number] Date of Birth: 1976-05-20       Patient Gender: F Patient Age:   31 years Exam Location:  [facility] Procedure:      VAS [place] UPPER EXT VEIN MAPPING (PRE-OP AVF) Referring Phys: Edrick Oh --------------------------------------------------------------------------------  Indications: Pre-access. Comparison Study: no prior Performing Technologist: Archie Patten RVS  Examination Guidelines: A complete evaluation includes B-mode imaging, spectral Doppler, color Doppler, and power Doppler as needed of all accessible portions of each vessel. Bilateral testing is considered an integral part of a complete examination. Limited examinations for reoccurring indications may be performed as noted. +-----------------+-------------+----------+--------+  Right Cephalic  Diameter (cm) Depth (cm) Findings  +-----------------+-------------+----------+--------+  Shoulder              0.34         0.57              +-----------------+-------------+----------+--------+  Prox upper arm        0.35         0.67              +-----------------+-------------+----------+--------+  Mid upper arm         0.33         0.94              +-----------------+-------------+----------+--------+  Dist upper arm        0.32         0.66              +-----------------+-------------+----------+--------+  Antecubital fossa     0.53         0.16               +-----------------+-------------+----------+--------+  Prox forearm          0.32         0.40              +-----------------+-------------+----------+--------+  Mid forearm           0.26         0.35              +-----------------+-------------+----------+--------+  Dist forearm          0.29         0.38              +-----------------+-------------+----------+--------+  Wrist                 0.23         0.25              +-----------------+-------------+----------+--------+ +-----------------+-------------+----------+---------+  Right Basilic     Diameter (cm) Depth (cm) Findings   +-----------------+-------------+----------+---------+  Prox upper arm        0.57         0.82               +-----------------+-------------+----------+---------+  Mid upper arm         0.51         0.75               +-----------------+-------------+----------+---------+  Dist upper arm        0.54         0.79               +-----------------+-------------+----------+---------+  Antecubital fossa     0.52         0.98               +-----------------+-------------+----------+---------+  Prox forearm          0.28         0.21    branching  +-----------------+-------------+----------+---------+  Mid forearm           0.27         0.10               +-----------------+-------------+----------+---------+  Distal forearm        0.20         0.11               +-----------------+-------------+----------+---------+  Wrist  0.15         0.15               +-----------------+-------------+----------+---------+ +-----------------+-------------+----------+--------+  Left Cephalic     Diameter (cm) Depth (cm) Findings  +-----------------+-------------+----------+--------+  Shoulder              0.43         0.82              +-----------------+-------------+----------+--------+  Prox upper arm        0.41         1.16              +-----------------+-------------+----------+--------+  Mid upper arm         0.41         1.09               +-----------------+-------------+----------+--------+  Dist upper arm        0.40         1.05              +-----------------+-------------+----------+--------+  Antecubital fossa     0.40         0.31              +-----------------+-------------+----------+--------+  Prox forearm          0.36         0.52              +-----------------+-------------+----------+--------+  Mid forearm           0.35         0.40              +-----------------+-------------+----------+--------+  Dist forearm          0.41         0.22              +-----------------+-------------+----------+--------+  Wrist                 0.21         0.34              +-----------------+-------------+----------+--------+ +-----------------+-------------+----------+---------+  Left Basilic      Diameter (cm) Depth (cm) Findings   +-----------------+-------------+----------+---------+  Prox upper arm        0.64         0.86               +-----------------+-------------+----------+---------+  Mid upper arm         0.64         0.92               +-----------------+-------------+----------+---------+  Dist upper arm        0.64         1.01               +-----------------+-------------+----------+---------+  Antecubital fossa     0.60         0.68               +-----------------+-------------+----------+---------+  Prox forearm          0.32         0.43    branching  +-----------------+-------------+----------+---------+  Mid forearm           0.29         0.20               +-----------------+-------------+----------+---------+  Distal forearm  0.21         0.30               +-----------------+-------------+----------+---------+  Wrist                 0.15         0.21               +-----------------+-------------+----------+---------+ *See table(s) above for measurements and observations.  Diagnosing physician: Monica Martinez MD Electronically signed by Monica Martinez MD on 08/18/2021 at 7:54:34 PM.    Final       Medications:    albumin human 25 g (08/16/21 0640)    Chlorhexidine Gluconate Cloth  6 each Topical Q0600   Chlorhexidine Gluconate Cloth  6 each Topical Q0600   cholecalciferol  1,000 Units Oral Daily   feeding supplement  237 mL Oral BID BM   heparin injection (subcutaneous)  5,000 Units Subcutaneous Q8H   midodrine  15 mg Oral TID WC   multivitamin  1 tablet Oral QHS   pantoprazole  40 mg Oral Daily   predniSONE  20 mg Oral Q breakfast   senna-docusate  1 tablet Oral QHS   albumin human, camphor-menthol, HYDROmorphone, loratadine, ondansetron (ZOFRAN) IV, polyethylene glycol  Assessment/ Plan:  Acute on Chronic Renal Failure  thought to be due to cardiorenal syndrome  Did receive CRRT until 12/8  now on IHD  with poor tolerability secondary to hypotension. Trying to cool dialysate although this may be problematic. Midodrine 15 mg tid.  Appeared to tolerate dialysis 08/18/2021 with 1.6 L removed Next dialysis will be 08/20/2021 ANEMIA- stable at this point - no ESA at present  MBD- stable  HTN/VOL removed 2.5 L 08/16/2021 interdialysis hypotension. No evidence of PE 11/21 ACCESS- Temp dialysis catheter   Acute on chronic diastolic heart failure  Liver Cirrhosis  - congestive hepatosteatosis . Does have some ascites on U/S in November. I wonder if this is contributing to her hypotension. We should work on clip process for outpatient dialysis.  Appreciate assistance from vein and vascular surgery.   LOS: [place] @TODAY @10 :59 AM

## 2021-08-19 NOTE — Anesthesia Procedure Notes (Signed)
Anesthesia Regional Block: Supraclavicular block   Pre-Anesthetic Checklist: , timeout performed,  Correct Patient, Correct Site, Correct Laterality,  Correct Procedure, Correct Position, site marked,  Risks and benefits discussed,  Surgical consent,  Pre-op evaluation,  At surgeon's request  Laterality: Left  Prep: chloraprep       Needles:  Injection technique: Single-shot  Needle Type: Echogenic Stimulator Needle     Needle Length: 10cm  Needle Gauge: 21   Needle insertion depth: 7 cm   Additional Needles:   Procedures:,,,, ultrasound used (permanent image in chart),,    Narrative:  Start time: 08/19/2021 1:40 PM End time: 08/19/2021 1:45 PM Injection made incrementally with aspirations every 5 mL.  Performed by: Personally  Anesthesiologist: Josephine Igo, MD  Additional Notes: Timeout performed. Patient sedated. Relevant anatomy ID'd using [place]. Incremental 2-71ml injection of LA with frequent aspiration. Patient tolerated procedure well.    Left Supraclavicular Block

## 2021-08-19 NOTE — Interval H&P Note (Signed)
History and Physical Interval Note:  08/19/2021 2:00 PM  Lauren Osborn  has presented today for surgery, with the diagnosis of ESRD.  The various methods of treatment have been discussed with the patient and family. After consideration of risks, benefits and other options for treatment, the patient has consented to  Procedure(s): LEFT ARM ARTERIOVENOUS (AV) FISTULA (Left) as a surgical intervention.  The patient's history has been reviewed, patient examined, no change in status, stable for surgery.  I have reviewed the patient's chart and labs.  Questions were answered to the patient's satisfaction.     Annamarie Major

## 2021-08-20 ENCOUNTER — Encounter (HOSPITAL_COMMUNITY): Payer: Self-pay | Admitting: Surgery

## 2021-08-20 LAB — CBC
HCT: 30.3 % — ABNORMAL LOW (ref 36.0–46.0)
Hemoglobin: 10 g/dL — ABNORMAL LOW (ref 12.0–15.0)
MCH: 32.3 pg (ref 26.0–34.0)
MCHC: 33 g/dL (ref 30.0–36.0)
MCV: 97.7 fL (ref 80.0–100.0)
Platelets: 261 10*3/uL (ref 150–400)
RBC: 3.1 MIL/uL — ABNORMAL LOW (ref 3.87–5.11)
RDW: 15.5 % (ref 11.5–15.5)
WBC: 9.7 10*3/uL (ref 4.0–10.5)
nRBC: 0 % (ref 0.0–0.2)

## 2021-08-20 LAB — RENAL FUNCTION PANEL
Albumin: 3.7 g/dL (ref 3.5–5.0)
Anion gap: 11 (ref 5–15)
BUN: 51 mg/dL — ABNORMAL HIGH (ref 6–20)
CO2: 23 mmol/L (ref 22–32)
Calcium: 9.3 mg/dL (ref 8.9–10.3)
Chloride: 92 mmol/L — ABNORMAL LOW (ref 98–111)
Creatinine, Ser: 3.81 mg/dL — ABNORMAL HIGH (ref 0.44–1.00)
GFR, Estimated: 14 mL/min — ABNORMAL LOW (ref >60)
Glucose, Bld: 101 mg/dL — ABNORMAL HIGH (ref 70–99)
Phosphorus: 4.8 mg/dL — ABNORMAL HIGH (ref 2.5–4.6)
Potassium: 5 mmol/L (ref 3.5–5.1)
Sodium: 126 mmol/L — ABNORMAL LOW (ref 135–145)

## 2021-08-20 MED ORDER — SODIUM CHLORIDE 0.9 % IV SOLN: 100.0000 mL | INTRAVENOUS | Status: DC | PRN

## 2021-08-20 MED ORDER — MIDODRINE HCL 5 MG PO TABS: 15.0000 mg | ORAL_TABLET | Freq: Three times a day (TID) | ORAL | 2 refills | Status: DC

## 2021-08-20 MED ORDER — PENTAFLUOROPROP-TETRAFLUOROETH EX AERO: 1.0000 "application " | INHALATION_SPRAY | CUTANEOUS | Status: DC | PRN

## 2021-08-20 MED ORDER — PANTOPRAZOLE SODIUM 40 MG PO TBEC: 40.0000 mg | DELAYED_RELEASE_TABLET | Freq: Every day | ORAL | 1 refills | Status: DC

## 2021-08-20 MED ORDER — HEPARIN SODIUM (PORCINE) 1000 UNIT/ML DIALYSIS: 1000.0000 [IU] | INTRAMUSCULAR | Status: DC | PRN

## 2021-08-20 MED ORDER — LIDOCAINE-PRILOCAINE 2.5-2.5 % EX CREA: 1.0000 "application " | TOPICAL_CREAM | CUTANEOUS | Status: DC | PRN

## 2021-08-20 MED ORDER — LIDOCAINE HCL (PF) 1 % IJ SOLN: 5.0000 mL | INTRAMUSCULAR | Status: DC | PRN

## 2021-08-20 MED ORDER — PREDNISONE 20 MG PO TABS: 20.0000 mg | ORAL_TABLET | Freq: Every day | ORAL | 0 refills | Status: AC

## 2021-08-20 MED ORDER — ALBUMIN HUMAN 25 % IV SOLN: 25.0000 g | Freq: Once | INTRAVENOUS | Status: DC

## 2021-08-20 MED ORDER — ALTEPLASE 2 MG IJ SOLR: 2.0000 mg | Freq: Once | INTRAMUSCULAR | Status: DC | PRN

## 2021-08-20 NOTE — Progress Notes (Signed)
[facility] ROUNDING NOTE   Subjective:   Interval History: 45 year old admitted with acute on chronic CHF. Echo 11/22  developed worsening AKI with attempted diuresis. CVVH was started in Nov and was stopped 12/8 . Patient now to receive intermittent HD.  Patient received dialysis 08/18/2021 1.6 L removed currently receiving dialysis 08/20/2021  BP 86/63 pulse 86 temperature 97.6 O2 sats 90% room air  Minimal urine output 200 cc recorded 10/18/2020  Sodium 126 potassium 5 chloride 92 CO2 23 BUN 51 creatinine 3.81 glucose 101 calcium 9.3 phosphorus 4.8 albumin 3.7 hemoglobin 10   Objective:  Vital signs in last 24 hours:  Temp:  [97.8 F (36.6 C)-98.6 F (37 C)] 97.8 F (36.6 C) (12/17 0856) Pulse Rate:  [59-90] 86 (12/17 1030) Resp:  [13-20] 13 (12/17 0856) BP: (84-105)/(6-71) 86/63 (12/17 1030) SpO2:  [93 %-100 %] 100 % (12/17 0856) Weight:  [91.2 kg-92 kg] 92 kg (12/17 0856)  Weight change: -3.027 kg Filed Weights   08/19/21 0417 08/20/21 0450 08/20/21 0856  Weight: 90.7 kg 91.2 kg 92 kg    Intake/Output: I/O last 3 completed shifts: In: 850 [P.O.:600; I.V.:200; IV Piggyback:50] Out: -    Intake/Output this shift:  Total I/O In: 120 [P.O.:120] Out: 0   CVS- RRR RS- CTA no wheezes  ABD- BS present soft non-distended EXT- no edema  temp dialysis catheter    Basic Metabolic Panel: Recent Labs  Lab 08/16/21 1243 08/17/21 0232 08/17/21 0945 08/18/21 0154 08/19/21 0310 08/20/21 0148  NA 132* 128* 127* 125* 129* 126*  K 3.7 5.6* 4.5 5.0 4.9 5.0  CL 95* 93* 92* 91* 93* 92*  CO2 25 24 24  21* 24 23  GLUCOSE 124* 122* 91 95 88 101*  BUN 31* 47* 53* 64* 38* 51*  CREATININE 2.72* 3.64* 4.08* 4.15* 3.04* 3.81*  CALCIUM 9.3 9.5 9.5 9.5 9.4 9.3  PHOS 2.4* 4.0  --  4.4 4.0 4.8*     Liver Function Tests: Recent Labs  Lab 08/16/21 1243 08/17/21 0232 08/18/21 0154 08/19/21 0310 08/20/21 0148  ALBUMIN 4.0 3.7 4.1 3.8 3.7    No results for  input(s): LIPASE, AMYLASE in the last 168 hours. No results for input(s): AMMONIA in the last 168 hours.  CBC: Recent Labs  Lab 08/16/21 0640 08/16/21 1243 08/18/21 0154 08/20/21 0148  WBC 8.1 7.7 11.2* 9.7  HGB 10.4* 10.5* 10.7* 10.0*  HCT 32.0* 32.5* 32.7* 30.3*  MCV 97.3 97.9 97.3 97.7  PLT 214 205 257 261     Cardiac Enzymes: No results for input(s): CKTOTAL, CKMB, CKMBINDEX, TROPONINI in the last 168 hours.  BNP: Invalid input(s): POCBNP  CBG: No results for input(s): GLUCAP in the last 168 hours.  Microbiology: Results for orders placed or performed during the hospital encounter of 07/22/21  Resp Panel by RT-PCR (Flu A&B, Covid) Nasopharyngeal Swab     Status: None   Collection Time: 07/22/21  9:54 PM   Specimen: Nasopharyngeal Swab; Nasopharyngeal(NP) swabs in vial transport medium  Result Value Ref Range Status   SARS Coronavirus 2 by RT PCR NEGATIVE NEGATIVE Final    Comment: (NOTE) SARS-CoV-2 target nucleic acids are NOT DETECTED.  The SARS-CoV-2 RNA is generally detectable in upper respiratory specimens during the acute phase of infection. The lowest concentration of SARS-CoV-2 viral copies this assay can detect is 138 copies/mL. A negative result does not preclude SARS-Cov-2 infection and should not be used as the sole basis for treatment or other patient management decisions.  A negative result may occur with  improper specimen collection/handling, submission of specimen other than nasopharyngeal swab, presence of viral mutation(s) within the areas targeted by this assay, and inadequate number of viral copies(<138 copies/mL). A negative result must be combined with clinical observations, patient history, and epidemiological information. The expected result is Negative.  Fact Sheet for Patients:  EntrepreneurPulse.com.au  Fact Sheet for Healthcare Providers:  IncredibleEmployment.be  This test is no t yet approved  or cleared by the [place] FDA and  has been authorized for detection and/or diagnosis of SARS-CoV-2 by FDA under an Emergency Use Authorization (EUA). This EUA will remain  in effect (meaning this test can be used) for the duration of the COVID-19 declaration under Section 564(b)(1) of the Act, 21 U.S.C.section 360bbb-3(b)(1), unless the authorization is terminated  or revoked sooner.       Influenza A by PCR NEGATIVE NEGATIVE Final   Influenza B by PCR NEGATIVE NEGATIVE Final    Comment: (NOTE) The Xpert Xpress SARS-CoV-2/FLU/RSV plus assay is intended as an aid in the diagnosis of influenza from Nasopharyngeal swab specimens and should not be used as a sole basis for treatment. Nasal washings and aspirates are unacceptable for Xpert Xpress SARS-CoV-2/FLU/RSV testing.  Fact Sheet for Patients: EntrepreneurPulse.com.au  Fact Sheet for Healthcare Providers: IncredibleEmployment.be  This test is not yet approved or cleared by the [place] FDA and has been authorized for detection and/or diagnosis of SARS-CoV-2 by FDA under an Emergency Use Authorization (EUA). This EUA will remain in effect (meaning this test can be used) for the duration of the COVID-19 declaration under Section 564(b)(1) of the Act, 21 U.S.C. section 360bbb-3(b)(1), unless the authorization is terminated or revoked.  Performed at [facility], [address], [place], [place] [address]   MRSA Next Gen by PCR, Nasal     Status: None   Collection Time: 08/08/21  8:11 PM   Specimen: Nasal Mucosa; Nasal Swab  Result Value Ref Range Status   MRSA by PCR Next Gen NOT DETECTED NOT DETECTED Final    Comment: (NOTE) The GeneXpert MRSA Assay (FDA approved for NASAL specimens only), is one component of a comprehensive MRSA colonization surveillance program. It is not intended to diagnose MRSA infection nor to guide or monitor treatment for MRSA infections. Test  performance is not FDA approved in patients less than 63 years old. Performed at [facility], [place] [address] [place], [place] [address]   SARS CORONAVIRUS 2 (TAT 6-24 HRS) Nasopharyngeal Nasopharyngeal Swab     Status: None   Collection Time: 08/19/21 10:23 AM   Specimen: Nasopharyngeal Swab  Result Value Ref Range Status   SARS Coronavirus 2 NEGATIVE NEGATIVE Final    Comment: (NOTE) SARS-CoV-2 target nucleic acids are NOT DETECTED.  The SARS-CoV-2 RNA is generally detectable in upper and lower respiratory specimens during the acute phase of infection. Negative results do not preclude SARS-CoV-2 infection, do not rule out co-infections with other pathogens, and should not be used as the sole basis for treatment or other patient management decisions. Negative results must be combined with clinical observations, patient history, and epidemiological information. The expected result is Negative.  Fact Sheet for Patients: SugarRoll.be  Fact Sheet for Healthcare Providers: https://www.woods-mathews.com/  This test is not yet approved or cleared by the [place] FDA and  has been authorized for detection and/or diagnosis of SARS-CoV-2 by FDA under an Emergency Use Authorization (EUA). This EUA will remain  in effect (meaning this test can  be used) for the duration of the COVID-19 declaration under Se ction 564(b)(1) of the Act, 21 U.S.C. section 360bbb-3(b)(1), unless the authorization is terminated or revoked sooner.  Performed at [facility], [place] [address] [place], [place] [address]     Coagulation Studies: No results for input(s): LABPROT, INR in the last 72 hours.  Urinalysis: No results for input(s): COLORURINE, LABSPEC, PHURINE, GLUCOSEU, HGBUR, BILIRUBINUR, KETONESUR, PROTEINUR, UROBILINOGEN, NITRITE, LEUKOCYTESUR in the last 72 hours.  Invalid input(s): APPERANCEUR    Imaging: VAS [place] UPPER EXT VEIN  MAPPING (PRE-OP AVF)  Result Date: 08/18/2021 UPPER EXTREMITY VEIN MAPPING Patient Name:  Lauren Osborn  Date of Exam:   08/18/2021 Medical Rec #: [id number]       Accession #:    [id number] Date of Birth: [birth year]       Patient Gender: F Patient Age:   46 years Exam Location:  [facility] Procedure:      VAS [place] UPPER EXT VEIN MAPPING (PRE-OP AVF) Referring Phys: Edrick Oh --------------------------------------------------------------------------------  Indications: Pre-access. Comparison Study: no prior Performing Technologist: Archie Patten RVS  Examination Guidelines: A complete evaluation includes B-mode imaging, spectral Doppler, color Doppler, and power Doppler as needed of all accessible portions of each vessel. Bilateral testing is considered an integral part of a complete examination. Limited examinations for reoccurring indications may be performed as noted. +-----------------+-------------+----------+--------+  Right Cephalic    Diameter (cm) Depth (cm) Findings  +-----------------+-------------+----------+--------+  Shoulder              0.34         0.57              +-----------------+-------------+----------+--------+  Prox upper arm        0.35         0.67              +-----------------+-------------+----------+--------+  Mid upper arm         0.33         0.94              +-----------------+-------------+----------+--------+  Dist upper arm        0.32         0.66              +-----------------+-------------+----------+--------+  Antecubital fossa     0.53         0.16              +-----------------+-------------+----------+--------+  Prox forearm          0.32         0.40              +-----------------+-------------+----------+--------+  Mid forearm           0.26         0.35              +-----------------+-------------+----------+--------+  Dist forearm          0.29         0.38              +-----------------+-------------+----------+--------+  Wrist                  0.23         0.25              +-----------------+-------------+----------+--------+ +-----------------+-------------+----------+---------+  Right Basilic     Diameter (cm) Depth (cm) Findings   +-----------------+-------------+----------+---------+  Prox upper arm  0.57         0.82               +-----------------+-------------+----------+---------+  Mid upper arm         0.51         0.75               +-----------------+-------------+----------+---------+  Dist upper arm        0.54         0.79               +-----------------+-------------+----------+---------+  Antecubital fossa     0.52         0.98               +-----------------+-------------+----------+---------+  Prox forearm          0.28         0.21    branching  +-----------------+-------------+----------+---------+  Mid forearm           0.27         0.10               +-----------------+-------------+----------+---------+  Distal forearm        0.20         0.11               +-----------------+-------------+----------+---------+  Wrist                 0.15         0.15               +-----------------+-------------+----------+---------+ +-----------------+-------------+----------+--------+  Left Cephalic     Diameter (cm) Depth (cm) Findings  +-----------------+-------------+----------+--------+  Shoulder              0.43         0.82              +-----------------+-------------+----------+--------+  Prox upper arm        0.41         1.16              +-----------------+-------------+----------+--------+  Mid upper arm         0.41         1.09              +-----------------+-------------+----------+--------+  Dist upper arm        0.40         1.05              +-----------------+-------------+----------+--------+  Antecubital fossa     0.40         0.31              +-----------------+-------------+----------+--------+  Prox forearm          0.36         0.52              +-----------------+-------------+----------+--------+  Mid  forearm           0.35         0.40              +-----------------+-------------+----------+--------+  Dist forearm          0.41         0.22              +-----------------+-------------+----------+--------+  Wrist                 0.21  0.34              +-----------------+-------------+----------+--------+ +-----------------+-------------+----------+---------+  Left Basilic      Diameter (cm) Depth (cm) Findings   +-----------------+-------------+----------+---------+  Prox upper arm        0.64         0.86               +-----------------+-------------+----------+---------+  Mid upper arm         0.64         0.92               +-----------------+-------------+----------+---------+  Dist upper arm        0.64         1.01               +-----------------+-------------+----------+---------+  Antecubital fossa     0.60         0.68               +-----------------+-------------+----------+---------+  Prox forearm          0.32         0.43    branching  +-----------------+-------------+----------+---------+  Mid forearm           0.29         0.20               +-----------------+-------------+----------+---------+  Distal forearm        0.21         0.30               +-----------------+-------------+----------+---------+  Wrist                 0.15         0.21               +-----------------+-------------+----------+---------+ *See table(s) above for measurements and observations.  Diagnosing physician: Monica Martinez MD Electronically signed by Monica Martinez MD on 08/18/2021 at 7:54:34 PM.    Final      Medications:    sodium chloride     sodium chloride     albumin human 25 g (08/20/21 1028)   albumin human      Chlorhexidine Gluconate Cloth  6 each Topical Q0600   Chlorhexidine Gluconate Cloth  6 each Topical Q0600   cholecalciferol  1,000 Units Oral Daily   feeding supplement  237 mL Oral BID BM   heparin injection (subcutaneous)  5,000 Units Subcutaneous Q8H   midodrine  15  mg Oral TID WC   multivitamin  1 tablet Oral QHS   pantoprazole  40 mg Oral Daily   predniSONE  20 mg Oral Q breakfast   senna-docusate  1 tablet Oral QHS   sodium chloride, sodium chloride, albumin human, alteplase, camphor-menthol, heparin, HYDROmorphone, lidocaine (PF), lidocaine-prilocaine, loratadine, ondansetron (ZOFRAN) IV, pentafluoroprop-tetrafluoroeth, polyethylene glycol  Assessment/ Plan:  Acute on Chronic Renal Failure  thought to be due to cardiorenal syndrome  Did receive CRRT until 12/8  now on IHD  with poor tolerability secondary to hypotension. Trying to cool dialysate although this may be problematic. Midodrine 15 mg tid.  Appeared to tolerate dialysis 08/18/2021 with 1.6 L removed.  Currently on dialysis 08/20/2021 ANEMIA- stable at this point - no ESA at present  MBD- stable  HTN/VOL removed 2.5 L 08/16/2021 interdialysis hypotension. No evidence of PE 11/21 ACCESS- Temp dialysis catheter   Acute on chronic diastolic heart failure  Liver Cirrhosis  - congestive hepatosteatosis .  Does have some ascites on U/S in November. I wonder if this is contributing to her hypotension. We should work on clip process for outpatient dialysis.  Appreciate assistance from vein and vascular surgery.   LOS: [place] @TODAY @10 :53 AM

## 2021-08-20 NOTE — TOC CM/SW Note (Addendum)
Confirmed with patient that shower stool has been delivered to room. Denies having any other DME needs.   Telephone call made to [facility] with [facility] to make aware patient is transitioning home today. Alwyn Ren to Microbiologist back. Home health previously arranged by weekday RNCM per notes on 08/19/21.   Addendum 1455: Received confirmation from [facility] that [facility] accepted referral and will follow up with patient. No further needs assessed.   Marthenia Rolling, MSN, RN,BSN Inpatient [facility] Case Manager [phone]

## 2021-08-20 NOTE — Progress Notes (Signed)
°  Daily Progress Note   Subjective: S/p left arm fistula  Objective: Vitals:   08/20/21 0909 08/20/21 0930  BP: 98/65 96/68  Pulse: 72 73  Resp:    Temp:    SpO2:      Physical Examination  Palpable radial and ulnar arteries bilaterally  No sensory or motor deficits in the hands Palpable thrill  ASSESSMENT/PLAN:   Will set follow with the office.  Pt okay for d/c from vascular surgery perspective    Cassandria Santee MD MS [facility] [phone] 08/20/2021  10:01 AM

## 2021-08-20 NOTE — Progress Notes (Signed)
Progress Note  Patient Name: Lauren Osborn Date of Encounter: 08/20/2021  [facility] Cardiologist: Rozann Lesches, MD    Subjective   45 year old with acute on chronic diastolic congestive heart failure with prominent right ventricular dysfunction.  She is now been here for 28 days.  She is done well.  She has been diuresed and is now on dialysis.  She is off milrinone now.  Inpatient Medications    Scheduled Meds:  Chlorhexidine Gluconate Cloth  6 each Topical Q0600   Chlorhexidine Gluconate Cloth  6 each Topical Q0600   cholecalciferol  1,000 Units Oral Daily   feeding supplement  237 mL Oral BID BM   heparin injection (subcutaneous)  5,000 Units Subcutaneous Q8H   midodrine  15 mg Oral TID WC   multivitamin  1 tablet Oral QHS   pantoprazole  40 mg Oral Daily   predniSONE  20 mg Oral Q breakfast   senna-docusate  1 tablet Oral QHS   Continuous Infusions:  sodium chloride     sodium chloride     albumin human 25 g (08/20/21 1028)   albumin human     PRN Meds: sodium chloride, sodium chloride, albumin human, alteplase, camphor-menthol, heparin, HYDROmorphone, lidocaine (PF), lidocaine-prilocaine, loratadine, ondansetron (ZOFRAN) IV, pentafluoroprop-tetrafluoroeth, polyethylene glycol   Vital Signs    Vitals:   08/20/21 0930 08/20/21 1000 08/20/21 1030 08/20/21 1100  BP: 96/68 (!) 89/61 (!) 86/63 92/69  Pulse: 73 71 86 73  Resp:      Temp:      TempSrc:      SpO2:      Weight:      Height:        Intake/Output Summary (Last 24 hours) at 08/20/2021 1114 Last data filed at 08/20/2021 0900 Gross per 24 hour  Intake 730 ml  Output 0 ml  Net 730 ml   Last 3 Weights 08/20/2021 08/20/2021 08/19/2021  Weight (lbs) 202 lb 13.2 oz 201 lb 199 lb 14.4 oz  Weight (kg) 92 kg 91.173 kg 90.674 kg      Telemetry     NSR - Personally Reviewed  ECG     - Personally Reviewed  Physical Exam   GEN: Middle-aged female, no acute distress, examined in  hemodialysis Neck: No JVD Cardiac: RRR, no murmurs, rubs, or gallops.  Respiratory: Clear to auscultation bilaterally. GI: Soft, nontender, non-distended  MS: No edema; No deformity. Neuro:  Nonfocal  Psych: Normal affect   Labs    High Sensitivity Troponin:  No results for input(s): TROPONINIHS in the last 720 hours.   Chemistry Recent Labs  Lab 08/18/21 0154 08/19/21 0310 08/20/21 0148  NA 125* 129* 126*  K 5.0 4.9 5.0  CL 91* 93* 92*  CO2 21* 24 23  GLUCOSE 95 88 101*  BUN 64* 38* 51*  CREATININE 4.15* 3.04* 3.81*  CALCIUM 9.5 9.4 9.3  ALBUMIN 4.1 3.8 3.7  GFRNONAA 13* 18* 14*  ANIONGAP 13 12 11     Lipids No results for input(s): CHOL, TRIG, HDL, LABVLDL, LDLCALC, CHOLHDL in the last 168 hours.  Hematology Recent Labs  Lab 08/16/21 1243 08/18/21 0154 08/20/21 0148  WBC 7.7 11.2* 9.7  RBC 3.32* 3.36* 3.10*  HGB 10.5* 10.7* 10.0*  HCT 32.5* 32.7* 30.3*  MCV 97.9 97.3 97.7  MCH 31.6 31.8 32.3  MCHC 32.3 32.7 33.0  RDW 15.6* 15.3 15.5  PLT 205 257 261   Thyroid No results for input(s): TSH, FREET4 in the last 168 hours.  BNPNo results for input(s): BNP, PROBNP in the last 168 hours.  DDimer No results for input(s): DDIMER in the last 168 hours.   Radiology    VAS [place] UPPER EXT VEIN MAPPING (PRE-OP AVF)  Result Date: 08/18/2021 UPPER EXTREMITY VEIN MAPPING Patient Name:  Lauren Osborn  Date of Exam:   08/18/2021 Medical Rec #: [id number]       Accession #:    [id number] Date of Birth: Dec 09, 1975       Patient Gender: F Patient Age:   45 years Exam Location:  [facility] Procedure:      VAS [place] UPPER EXT VEIN MAPPING (PRE-OP AVF) Referring Phys: Edrick Oh --------------------------------------------------------------------------------  Indications: Pre-access. Comparison Study: no prior Performing Technologist: Archie Patten RVS  Examination Guidelines: A complete evaluation includes B-mode imaging, spectral Doppler, color Doppler, and power Doppler as  needed of all accessible portions of each vessel. Bilateral testing is considered an integral part of a complete examination. Limited examinations for reoccurring indications may be performed as noted. +-----------------+-------------+----------+--------+  Right Cephalic    Diameter (cm) Depth (cm) Findings  +-----------------+-------------+----------+--------+  Shoulder              0.34         0.57              +-----------------+-------------+----------+--------+  Prox upper arm        0.35         0.67              +-----------------+-------------+----------+--------+  Mid upper arm         0.33         0.94              +-----------------+-------------+----------+--------+  Dist upper arm        0.32         0.66              +-----------------+-------------+----------+--------+  Antecubital fossa     0.53         0.16              +-----------------+-------------+----------+--------+  Prox forearm          0.32         0.40              +-----------------+-------------+----------+--------+  Mid forearm           0.26         0.35              +-----------------+-------------+----------+--------+  Dist forearm          0.29         0.38              +-----------------+-------------+----------+--------+  Wrist                 0.23         0.25              +-----------------+-------------+----------+--------+ +-----------------+-------------+----------+---------+  Right Basilic     Diameter (cm) Depth (cm) Findings   +-----------------+-------------+----------+---------+  Prox upper arm        0.57         0.82               +-----------------+-------------+----------+---------+  Mid upper arm         0.51         0.75               +-----------------+-------------+----------+---------+  Dist upper arm        0.54         0.79               +-----------------+-------------+----------+---------+  Antecubital fossa     0.52         0.98               +-----------------+-------------+----------+---------+  Prox  forearm          0.28         0.21    branching  +-----------------+-------------+----------+---------+  Mid forearm           0.27         0.10               +-----------------+-------------+----------+---------+  Distal forearm        0.20         0.11               +-----------------+-------------+----------+---------+  Wrist                 0.15         0.15               +-----------------+-------------+----------+---------+ +-----------------+-------------+----------+--------+  Left Cephalic     Diameter (cm) Depth (cm) Findings  +-----------------+-------------+----------+--------+  Shoulder              0.43         0.82              +-----------------+-------------+----------+--------+  Prox upper arm        0.41         1.16              +-----------------+-------------+----------+--------+  Mid upper arm         0.41         1.09              +-----------------+-------------+----------+--------+  Dist upper arm        0.40         1.05              +-----------------+-------------+----------+--------+  Antecubital fossa     0.40         0.31              +-----------------+-------------+----------+--------+  Prox forearm          0.36         0.52              +-----------------+-------------+----------+--------+  Mid forearm           0.35         0.40              +-----------------+-------------+----------+--------+  Dist forearm          0.41         0.22              +-----------------+-------------+----------+--------+  Wrist                 0.21         0.34              +-----------------+-------------+----------+--------+ +-----------------+-------------+----------+---------+  Left Basilic      Diameter (cm) Depth (cm) Findings   +-----------------+-------------+----------+---------+  Prox upper arm        0.64         0.86               +-----------------+-------------+----------+---------+  Mid upper arm         0.64         0.92                +-----------------+-------------+----------+---------+  Dist upper arm        0.64         1.01               +-----------------+-------------+----------+---------+  Antecubital fossa     0.60         0.68               +-----------------+-------------+----------+---------+  Prox forearm          0.32         0.43    branching  +-----------------+-------------+----------+---------+  Mid forearm           0.29         0.20               +-----------------+-------------+----------+---------+  Distal forearm        0.21         0.30               +-----------------+-------------+----------+---------+  Wrist                 0.15         0.21               +-----------------+-------------+----------+---------+ *See table(s) above for measurements and observations.  Diagnosing physician: Monica Martinez MD Electronically signed by Monica Martinez MD on 08/18/2021 at 7:54:34 PM.    Final     Cardiac Studies     Patient Profile     45 y.o. female with acute on chronic CHF,  now with ESRD - has started HD   Assessment & Plan    1.  Acute on chronic diastolic congestive heart failure.  She was massively overloaded on admission.  She was diuresed but developed worsening AKI.  She now is on dialysis.  She is quite stable and is close to her baseline.  She will be able to be discharged today following dialysis.  2.  Obstructive sleep apnea: Continue CPAP at night  2.  Hyperkalemia, managed by nephrology.  For questions or updates, please contact [place] Please consult [email or web address] for contact info under        Signed, Mertie Moores, MD  08/20/2021, 11:14 AM

## 2021-08-20 NOTE — Discharge Summary (Addendum)
Discharge Summary    Patient ID: Lauren Osborn MRN: [id number]; DOB: 09/04/1976  Admit date: 07/22/2021 Discharge date: 08/20/2021  PCP:  [name], MD   [facility] Providers Cardiologist:  Rozann Lesches, MD        Discharge Diagnoses    Active Problems:   Normocytic anemia   Elevated brain natriuretic peptide (BNP) level   GERD (gastroesophageal reflux disease)   Anasarca   Acute on chronic heart failure with preserved ejection fraction (HFpEF) ([place])   Morbid obesity with BMI of 50.0-59.9, adult (HCC)   CKD (chronic kidney disease), stage IV (HCC)   Abdominal pain   Vitamin D deficiency   Hepatic cirrhosis ([place])   Hepatic hemangioma   Diagnostic Studies/Procedures    RHC: 07/26/2021  1. Markedly elevated left and right heart filling pressures.  2. Moderate pulmonary venous hypertension.  3. Preserved cardiac output.   Fistula Placement: 08/19/2021    Procedure: The patient was identified in the holding area and taken to [place] [facility]  The patient was then placed supine on the table. regional anesthesia was administered.  The patient was prepped and draped in the usual sterile fashion.  A time out was called and antibiotics were administered.  A PA was necessary to expedite the procedure and assist with technical details.   Ultrasound was used to evaluate the cephalic vein in the upper arm.  It was of adequate caliber for fistula creation.  A transverse incision was made just proximal to the antecubital crease.  Cautery was used divide subcutaneous tissue cautery and sharp dissection were used to expose the brachial artery which was a 3 mm disease-free artery.  Next I exposed the median cubital vein which was a 3-4 mm vein.  There was minimal scarring around the vein.  I traced it back to the junction of the cephalic vein and also dissected out the distal cephalic vein.  I then marked the vein for orientation.  Both the median cubital and cephalic vein were  ligated distally.  When I distended the veins, the median cubital vein was the better of the 2 and so the c distal cephalic vein was ligated.  Next, the brachial artery was occluded with vascular clamps.  A #11 blade was used to make an arteriotomy which was extended longitudinally with Potts scissors.  The vein was cut the appropriate length and spatulated that the size the arteriotomy.  A running anastomosis was performed with 6-0 Prolene.  Prior to completion the appropriate flushing maneuvers were performed and the anastomosis was completed.  I inspected the course of the vein to make sure there were no kinks.  There was a palpable thrill within the fistula.  The patient had a multiphasic radial and ulnar artery Doppler signals.  Next hemostasis was achieved.  The subcutaneous tissue was closed with 3-0 Vicryl followed by subcuticular closure.  Dermabond was applied.   History of Present Illness     Lauren Osborn is a 45 y.o. female with with past medical history of HFpEF, GERD, Stage 3-4 CKD, gout, normocytic anemia and severe OSA who presented to [facility] ED on 07/22/2021 for evaluation of worsening dyspnea on exertion, abdominal distention and lower extremity edema.   She had recently been evaluated by GI and reported worsening right upper quadrant pain and abdominal ultrasound showed some degree of mild gallbladder wall thickening along with hepatic cirrhosis and ascites. Titration of PO diuretics had also been limited due to her worsening renal function.  She was started on IV Lasix 80 mg twice daily and transferred to [facility] for further management.  Hospital Course     Consultants: Hospitalist, Nephrology, Vascular Surgery, Palliative Medicine ([place] Discussions)  She did not respond well with IV Lasix and Nephrology was consulted given her rising creatinine. IV Albumin was administered but she continued to have poor output. A VQ scan was obtained and negative for a PE. Underwent RHC on  07/26/2021 which showed markedly elevated left and right filling pressures and moderate pulmonary hypertension with preserved cardiac output. Advanced Heart Failure was consulted and recommended starting a high-dose Lasix drip at 20mg /hr and she was also started on Midodrine for BP support. This was further titrated with the use of Metolazone and renal function continued to worsen with minimal urinary response. Was ultimately started on CRRT on 07/29/2021. Continued to have hypotension and Milrinone was initiated as well but discontinued as Co-ox improved. She continued to respond well to CRRT but given that she remained anuric and without renal recovery, she was started on HD on 08/11/2021. Sessions were complicated by hypotension which improved with Midodrine and Albumin. She did start to have gout during admission which improved with a Prednisone taper (will finish remaining 2 days at discharge). Vascular Surgery was consulted for access prior to discharge and she underwent placement of a left brachopcephalic fistula on 70/26/3785. Was examined by Vascular on 08/20/2021 and deemed stable for discharge from their perspective. Did have HD on 08/20/2021 with no immediate complications noted and outpatient HD has been arranged for 08/22/2021. She will be on Midodrine 15mg  TID given her hypotension. Weight declined over 114 lbs during admission from 316 lbs on the day of admission to 202 lbs on 08/20/2021. Was examined by Dr. Acie Fredrickson on 08/20/2021 and deemed stable for discharge.   Did the patient have an acute coronary syndrome (MI, NSTEMI, STEMI, etc) this admission?:  No                               Did the patient have a percutaneous coronary intervention (stent / angioplasty)?:  No.       _____________  Discharge Vitals Blood pressure 94/64, pulse 77, temperature 97.8 F (36.6 C), temperature source Oral, resp. rate 13, height 5\' 3"  (1.6 m), weight 92 kg, SpO2 100 %.  Filed Weights   08/19/21 0417  08/20/21 0450 08/20/21 0856  Weight: 90.7 kg 91.2 kg 92 kg    Labs & Radiologic Studies    CBC Recent Labs    08/18/21 0154 08/20/21 0148  WBC 11.2* 9.7  HGB 10.7* 10.0*  HCT 32.7* 30.3*  MCV 97.3 97.7  PLT 257 885   Basic Metabolic Panel Recent Labs    08/19/21 0310 08/20/21 0148  NA 129* 126*  K 4.9 5.0  CL 93* 92*  CO2 24 23  GLUCOSE 88 101*  BUN 38* 51*  CREATININE 3.04* 3.81*  CALCIUM 9.4 9.3  PHOS 4.0 4.8*   Liver Function Tests Recent Labs    08/19/21 0310 08/20/21 0148  ALBUMIN 3.8 3.7   No results for input(s): LIPASE, AMYLASE in the last 72 hours. High Sensitivity Troponin:   No results for input(s): TROPONINIHS in the last 720 hours.  BNP Invalid input(s): POCBNP D-Dimer No results for input(s): DDIMER in the last 72 hours. Hemoglobin A1C No results for input(s): HGBA1C in the last 72 hours. Fasting Lipid Panel No results for input(s): CHOL, HDL,  LDLCALC, TRIG, CHOLHDL, LDLDIRECT in the last 72 hours. Thyroid Function Tests No results for input(s): TSH, T4TOTAL, T3FREE, THYROIDAB in the last 72 hours.  Invalid input(s): FREET3 _____________  NM Pulmonary Perfusion  Result Date: 07/25/2021 CLINICAL DATA:  Short of breath, history of tobacco abuse EXAM: NUCLEAR MEDICINE PERFUSION LUNG SCAN TECHNIQUE: Perfusion images were obtained in multiple projections after intravenous injection of radiopharmaceutical. Ventilation scans intentionally deferred if perfusion scan and chest x-ray adequate for interpretation during COVID 19 epidemic. RADIOPHARMACEUTICALS:  4.4 mCi Tc-44m MAA IV COMPARISON:  07/25/2021 FINDINGS: Planar images of the chest are obtained during the perfusion examination. There are no wedge-shaped perfusion defects to suggest pulmonary embolus. Symmetrical distribution of radiotracer seen bilaterally. IMPRESSION: 1. No evidence of pulmonary embolus. Electronically Signed   By: Randa Ngo M.D.   On: 07/25/2021 15:50    US Venous Img  Lower Unilateral Left (DVT)  Result Date: 07/23/2021 CLINICAL DATA:  Left leg swelling EXAM: LEFT LOWER EXTREMITY VENOUS DOPPLER ULTRASOUND TECHNIQUE: Gray-scale sonography with compression, as well as color and duplex ultrasound, were performed to evaluate the deep venous system(s) from the level of the common femoral vein through the popliteal and proximal calf veins. COMPARISON:  None. FINDINGS: VENOUS Normal compressibility of the common femoral, superficial femoral, and popliteal veins, as well as the visualized calf veins. Visualized portions of profunda femoral vein and great saphenous vein unremarkable. No filling defects to suggest DVT on grayscale or color Doppler imaging. Doppler waveforms show normal direction of venous flow, normal respiratory plasticity and response to augmentation. Limited views of the contralateral common femoral vein are unremarkable. OTHER There is a heterogeneous soft tissue mass in the anterior lower leg measuring 7.9 x 5.4 x 3.5 cm. Mild internal vascularity. Limitations: Limited assessment of the calf veins due to pitting edema and body habitus. IMPRESSION: 1. Negative for DVT in the left lower extremity. Exam is somewhat limited particularly in the calf veins due to edema and body habitus. 2. Indeterminate heterogeneous soft tissue mass in the anterior lower leg measuring up to 7.9 cm. Tissue sampling may be considered. Per patient report mass has been enlarging. Electronically Signed   By: Audie Pinto M.D.   On: 07/23/2021 09:34   DG CHEST PORT 1 VIEW  Result Date: 07/29/2021 CLINICAL DATA:  45 year old female status post central line placement. EXAM: PORTABLE CHEST - 1 VIEW COMPARISON:  07/25/2021 FINDINGS: The mediastinal contours are within normal limits. No cardiomegaly. Interval insertion right internal jugular central venous catheter with the catheter tip located in the superior aspect of the superior vena cava. The lungs are clear bilaterally without  evidence of focal consolidation, pleural effusion, or pneumothorax. No acute osseous abnormality. IMPRESSION: Right IJ central line placement with the catheter tip in the superior vena cava, no complicating features. Electronically Signed   By: Ruthann Cancer M.D.   On: 07/29/2021 16:18   DG Chest Port 1 View  Result Date: 07/25/2021 CLINICAL DATA:  Dyspnea EXAM: PORTABLE CHEST 1 VIEW COMPARISON:  07/22/2021 FINDINGS: Single frontal view of the chest demonstrates an unremarkable cardiac silhouette. No airspace disease, effusion, or pneumothorax. No acute bony abnormalities. IMPRESSION: 1. Stable exam, no acute process. Electronically Signed   By: Randa Ngo M.D.   On: 07/25/2021 15:49   DG Chest Portable 1 View  Result Date: 07/22/2021 CLINICAL DATA:  Dyspnea EXAM: PORTABLE CHEST 1 VIEW COMPARISON:  06/23/2021 FINDINGS: Lungs volumes are small, but are symmetric and are clear. No pneumothorax or pleural effusion. Cardiac  size within normal limits. Vascular crowding at the hila secondary to poor pulmonary insufflation. Osseous structures are age-appropriate. No acute bone abnormality. IMPRESSION: No active disease. Electronically Signed   By: Fidela Salisbury M.D.   On: 07/22/2021 22:01     VAS [place] UPPER EXT VEIN MAPPING (PRE-OP AVF)  Result Date: 08/18/2021 UPPER EXTREMITY VEIN MAPPING Patient Name:  Lauren Osborn  Date of Exam:   08/18/2021 Medical Rec #: [id number]       Accession #:    [id number] Date of Birth: 07/31/1976       Patient Gender: F Patient Age:   35 years Exam Location:  [facility] Procedure:      VAS [place] UPPER EXT VEIN MAPPING (PRE-OP AVF) Referring Phys: Edrick Oh --------------------------------------------------------------------------------  Indications: Pre-access. Comparison Study: no prior Performing Technologist: Archie Patten RVS  Examination Guidelines: A complete evaluation includes B-mode imaging, spectral Doppler, color Doppler, and power Doppler as needed  of all accessible portions of each vessel. Bilateral testing is considered an integral part of a complete examination. Limited examinations for reoccurring indications may be performed as noted. +-----------------+-------------+----------+--------+  Right Cephalic    Diameter (cm) Depth (cm) Findings  +-----------------+-------------+----------+--------+  Shoulder              0.34         0.57              +-----------------+-------------+----------+--------+  Prox upper arm        0.35         0.67              +-----------------+-------------+----------+--------+  Mid upper arm         0.33         0.94              +-----------------+-------------+----------+--------+  Dist upper arm        0.32         0.66              +-----------------+-------------+----------+--------+  Antecubital fossa     0.53         0.16              +-----------------+-------------+----------+--------+  Prox forearm          0.32         0.40              +-----------------+-------------+----------+--------+  Mid forearm           0.26         0.35              +-----------------+-------------+----------+--------+  Dist forearm          0.29         0.38              +-----------------+-------------+----------+--------+  Wrist                 0.23         0.25              +-----------------+-------------+----------+--------+ +-----------------+-------------+----------+---------+  Right Basilic     Diameter (cm) Depth (cm) Findings   +-----------------+-------------+----------+---------+  Prox upper arm        0.57         0.82               +-----------------+-------------+----------+---------+  Mid upper arm         0.51  0.75               +-----------------+-------------+----------+---------+  Dist upper arm        0.54         0.79               +-----------------+-------------+----------+---------+  Antecubital fossa     0.52         0.98               +-----------------+-------------+----------+---------+  Prox forearm           0.28         0.21    branching  +-----------------+-------------+----------+---------+  Mid forearm           0.27         0.10               +-----------------+-------------+----------+---------+  Distal forearm        0.20         0.11               +-----------------+-------------+----------+---------+  Wrist                 0.15         0.15               +-----------------+-------------+----------+---------+ +-----------------+-------------+----------+--------+  Left Cephalic     Diameter (cm) Depth (cm) Findings  +-----------------+-------------+----------+--------+  Shoulder              0.43         0.82              +-----------------+-------------+----------+--------+  Prox upper arm        0.41         1.16              +-----------------+-------------+----------+--------+  Mid upper arm         0.41         1.09              +-----------------+-------------+----------+--------+  Dist upper arm        0.40         1.05              +-----------------+-------------+----------+--------+  Antecubital fossa     0.40         0.31              +-----------------+-------------+----------+--------+  Prox forearm          0.36         0.52              +-----------------+-------------+----------+--------+  Mid forearm           0.35         0.40              +-----------------+-------------+----------+--------+  Dist forearm          0.41         0.22              +-----------------+-------------+----------+--------+  Wrist                 0.21         0.34              +-----------------+-------------+----------+--------+ +-----------------+-------------+----------+---------+  Left Basilic      Diameter (cm) Depth (cm) Findings   +-----------------+-------------+----------+---------+  Prox upper arm        0.64  0.86               +-----------------+-------------+----------+---------+  Mid upper arm         0.64         0.92               +-----------------+-------------+----------+---------+  Dist  upper arm        0.64         1.01               +-----------------+-------------+----------+---------+  Antecubital fossa     0.60         0.68               +-----------------+-------------+----------+---------+  Prox forearm          0.32         0.43    branching  +-----------------+-------------+----------+---------+  Mid forearm           0.29         0.20               +-----------------+-------------+----------+---------+  Distal forearm        0.21         0.30               +-----------------+-------------+----------+---------+  Wrist                 0.15         0.21               +-----------------+-------------+----------+---------+ *See table(s) above for measurements and observations.  Diagnosing physician: Monica Martinez MD Electronically signed by Monica Martinez MD on 08/18/2021 at 7:54:34 PM.    Final    Disposition   Pt is being discharged home today in good condition.  Follow-up Plans & Appointments     Follow-up Information     Erma Heritage, PA-C Follow up on 08/26/2021.   Specialties: Physician Assistant, Cardiology Why: 1:30 pm Contact information: [place] [address] [phone]         [facility] Follow up on 09/12/2021.   Specialty: Cardiology Why: Advanced Heart Failure Clinic at 2:40 pm Contact information: [address] [id number] [place], [place] Follow up.   Why: [place] is Monday/Wednesday/Friday Pt can start on 12/19.  Pt needs to arrive at 6:00 for 6:15 chair time. Contact information: [place] [address] [phone]         [facility] Follow up.   Why: Shower chair- to be delivered to the room prior to d/c. Contact information: [address] [place] [address] [phone]         [facility] [place] Follow up.   Specialty: Home  Health Services Why: Registered Nurse and Physical Therapy-office to call the patient with visit times. Contact information: [address] [place] [address] [phone]                Discharge Instructions     Diet - low sodium heart healthy   Complete by: As directed    Increase activity slowly   Complete by: As directed    No dressing needed   Complete by: As directed        Discharge Medications   Allergies as of 08/20/2021  Reactions   Oxycodone-acetaminophen Hives, Other (See Comments)        Medication List     STOP taking these medications    magnesium oxide 400 MG tablet Commonly known as: MAG-OX   metolazone 2.5 MG tablet Commonly known as: ZAROXOLYN   omeprazole 20 MG capsule Commonly known as: PriLOSEC   potassium chloride SA 20 MEQ tablet Commonly known as: KLOR-CON M   spironolactone 25 MG tablet Commonly known as: ALDACTONE   torsemide 20 MG tablet Commonly known as: DEMADEX       TAKE these medications    acetaminophen 500 MG tablet Commonly known as: TYLENOL Take 1,000 mg by mouth every 6 (six) hours as needed for moderate pain or headache.   midodrine 5 MG tablet Commonly known as: PROAMATINE Take 3 tablets (15 mg total) by mouth 3 (three) times daily with meals.   pantoprazole 40 MG tablet Commonly known as: PROTONIX Take 1 tablet (40 mg total) by mouth daily.   predniSONE 20 MG tablet Commonly known as: DELTASONE Take 1 tablet (20 mg total) by mouth daily with breakfast for 2 days.   TART CHERRY PO Take by mouth daily.               Durable Medical Equipment  (From admission, onward)           Start     Ordered   08/05/21 1411  For home use only DME Shower stool  Once       Comments: 5'3" 116 kg, shower chair with back   08/05/21 1411              Discharge Care Instructions  (From admission, onward)           Start     Ordered   08/20/21 0000  No dressing  needed        08/20/21 1422               Outstanding Labs/Studies   None  Duration of Discharge Encounter   Greater than 30 minutes including physician time.  Signed, Erma Heritage, PA-C 08/20/2021, 2:32 PM   Attending Note:   The patient was seen and examined.  Agree with assessment and plan as noted above.  Changes made to the above note as needed.  Patient seen and independently examined with Bernerd Pho, [place] .   We discussed all aspects of the encounter. I agree with the assessment and plan as stated above.     1.  Acute on chronic diastolic congestive heart failure.  She was massively overloaded on admission.  She was diuresed but developed worsening AKI.  She now is on dialysis.   She is quite stable and is close to her baseline.  She will be able to be discharged today following dialysis.   2.  Obstructive sleep apnea: Continue CPAP at night   2.  Hyperkalemia, managed by nephrology.   I have spent a total of 40 minutes with patient reviewing hospital  notes , telemetry, EKGs, labs and examining patient as well as establishing an assessment and plan that was discussed with the patient.  > 50% of time was spent in direct patient care.    Thayer Headings, Brooke Bonito., MD, [facility] 08/21/2021, 2:59 PM [address]  [place] Pager [phone]

## 2021-08-22 ENCOUNTER — Other Ambulatory Visit: Payer: Self-pay

## 2021-08-22 DIAGNOSIS — N184 Chronic kidney disease, stage 4 (severe): Secondary | ICD-10-CM

## 2021-08-22 DIAGNOSIS — R601 Generalized edema: Secondary | ICD-10-CM

## 2021-08-22 DIAGNOSIS — I509 Heart failure, unspecified: Secondary | ICD-10-CM

## 2021-08-22 DIAGNOSIS — K769 Liver disease, unspecified: Secondary | ICD-10-CM

## 2021-08-22 NOTE — Progress Notes (Signed)
Late Entry Note:  Spoke to [place], Therapist, sports at [employer] this am. Aware covid test result and d/c summary to be faxed to clinic this am. Pt currently receiving treatment at clinic.  Melven Sartorius Renal Navigator [phone]

## 2021-08-23 ENCOUNTER — Other Ambulatory Visit: Payer: Self-pay

## 2021-08-23 ENCOUNTER — Other Ambulatory Visit: Payer: Self-pay | Admitting: *Deleted

## 2021-08-23 NOTE — Patient Outreach (Signed)
Medicaid Managed Care   Nurse Care Manager Note  08/23/2021 Name:  Lauren Osborn MRN:  [id number] DOB:  December 14, 1975  Lauren Osborn is an 45 y.o. year old female who is a primary patient of [name], MD.  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 Lauren Osborn Patient agreed to services and verbal consent obtained.  Engaged with patient by telephone for initial visit in response to provider referral for case management and/or care coordination services.   Assessments/Interventions:  Review of past medical history, allergies, medications, health status, including review of consultants reports, laboratory and other test data, was performed as part of comprehensive evaluation and provision of chronic care management services.  SDOH (Social Determinants of Health) assessments and interventions performed: SDOH Interventions    Flowsheet Row Most Recent Value  SDOH Interventions   Food Insecurity Interventions Intervention Not Indicated  Financial Strain Interventions Other (Comment)  [Advised patient to apply for disability on line at [facility]  Transportation Interventions Intervention Not Indicated       Care Plan  Allergies  Allergen Reactions   Oxycodone-Acetaminophen Hives and Other (See Comments)    Medications Reviewed Today     Reviewed by [name], RN (Registered Nurse) on 08/23/21 at [place] List Status: <None>   Medication Order Taking? Sig Documenting Provider Last Dose Status Informant  acetaminophen (TYLENOL) 500 MG tablet [id number] Yes Take 1,000 mg by mouth every 6 (six) hours as needed for moderate pain or headache. [provider] Taking Active Self  midodrine (PROAMATINE) 5 MG tablet [id number] Yes Take 3 tablets (15 mg total) by mouth 3 (three) times daily with meals. Erma Heritage,  PA-C Taking Active   pantoprazole (PROTONIX) 40 MG tablet [id number] Yes Take 1 tablet (40 mg total) by mouth daily. Erma Heritage, PA-C Taking Active   TART CHERRY PO [id number] Yes Take by mouth daily. [provider] Taking Active Self            Patient Active Problem List   Diagnosis Date Noted   Abdominal pain 07/23/2021   Vitamin D deficiency 07/23/2021   Hepatic cirrhosis (Lauren Osborn) 07/23/2021   Hepatic hemangioma 07/23/2021   Right upper quadrant abdominal pain 07/22/2021   Rectal bleeding 07/22/2021   Shortness of breath 07/22/2021   Acute exacerbation of CHF (congestive heart failure) ([name]) 07/22/2021   CKD (chronic kidney disease), stage IV ([name]) 06/23/2021   Liver lesion 12/01/2020   Liver disease 12/01/2020   Elevated bilirubin 12/01/2020   OSA (obstructive sleep apnea) 11/16/2020   Acute on chronic heart failure with preserved ejection fraction (HFpEF) ([name]) 09/22/2020   Morbid obesity with BMI of 50.0-59.9, adult ([name]) 09/22/2020   Pulmonary edema 09/16/2020   Hypokalemia 09/16/2020   Prolonged QT interval 09/16/2020   Acute kidney injury ([name]) 09/16/2020   Elevated brain natriuretic peptide (BNP) level 09/16/2020   GERD (gastroesophageal reflux disease) 09/16/2020   Anasarca 09/16/2020   Super obese 09/16/2020   Normocytic anemia 04/23/2020   Family history of colon cancer 04/23/2020   Mass of upper outer quadrant of right breast 11/04/2019   S/P partial thyroidectomy 10/21/2018   Visit for routine gyn exam 07/03/2018    Conditions to be addressed/monitored per PCP order:  CHF and CKD Stage 3-4  Care Plan : RN Care Manager Plan of Care  Updates made  by [name], RN since 08/23/2021 12:00 AM     Problem: Health Management Needs Related to CHF and CKD      Long-Range Goal: Development of Plan of Care to Address Health Management Needs Related to CHF and CKD   Start Date: 08/23/2021  Expected End Date: 11/21/2021  Priority: High   Note:   Current Barriers:  Chronic Disease Management support and education needs related to 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 Meloni was recently hospitalized for 28 days for new diagnosis of CHF and CKD. She is feeling better now. She is attending dialysis M/W/F and is unable to work. 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 She is concerned about her finances. She is working at eating healthy, limiting her fluid and sodium intake and plans to attend all of her upcoming appointments.  RNCM Clinical Goal(s):  Patient will verbalize understanding of plan for management of CHF and CKD Stage 3-4 as evidenced by patient verbalization of self monitoring activities take all medications exactly as prescribed and will call provider for medication related questions as evidenced by documentation in EMR    attend all scheduled medical appointments: dialysis on M/W/F, 12/23 at 1:30p with Cardiology and 09/12/21 at 2:40 at [facility] as evidenced by provider documentation in EMR        demonstrate improved adherence to prescribed treatment plan for CHF and CKD Stage 3-4 as evidenced by provider documentation in EMR  through collaboration with RN Care manager, provider, and care team.   Interventions: Inter-disciplinary care team collaboration (see longitudinal plan of care) Evaluation of current treatment plan related to  self management and patient's adherence to plan as established by provider Advised patient to apply for disability on SSA.gov website Advised patient to schedule a follow up with PCP   Heart Failure Interventions:  (Status: New goal.)  Long Term Goal  Basic overview and discussion of pathophysiology of Heart Failure reviewed Provided education on low sodium diet Discussed the importance of keeping all appointments with provider  Chronic Kidney Disease (Status: New goal.)  Long Term Goal  Last practice recorded BP readings:  BP Readings from Last 3 Encounters:   08/20/21 99/62  07/22/21 114/83  07/14/21 100/78  Most recent eGFR/CrCl: No results found for: EGFR  No components found for: CRCL  Assessed the patient     understanding of chronic kidney disease    Evaluation of current treatment plan related to chronic kidney disease self management and patient's adherence to plan as established by provider      Reviewed medications with patient and discussed importance of compliance    Reviewed scheduled/upcoming provider appointments including    Discussed plans with patient for ongoing care management follow up and provided patient with direct contact information for care management team    Assessed social determinant of health barriers    Support coping and stress management by recognizing current strategies and assist in developing new strategies such as mindfulness, journaling, relaxation techniques, problem-solving     Patient Goals/Self-Care Activities: Take medications as prescribed   Attend all scheduled provider appointments Call pharmacy for medication refills 3-7 days in advance of running out of medications Perform all self care activities independently  Perform IADL's (shopping, preparing meals, housekeeping, managing finances) independently Call provider office for new concerns or questions  call office if I gain more than 2 pounds in one day or 5 pounds in one week meet with dietitian track weight in  diary use salt in moderation watch for swelling in feet, ankles and legs every day begin a heart failure diary eat more whole grains, fruits and vegetables, lean meats and healthy fats       Follow Up:  Patient agrees to Care Plan and Follow-up.  Plan: The Managed Medicaid care management team will reach out to the patient again over the next 21 days.  Date/time of next scheduled RN care management/care coordination outreach:  09/15/21 @ [place] RN, [place] RN Care Coordinator

## 2021-08-23 NOTE — Patient Instructions (Signed)
Visit Information  [name] was given information about Medicaid Managed Care team care coordination services as a part of their Healthy [facility] Medicaid benefit. Lauren Osborn verbally consented to engagement with the [facility] Managed Care team.   If you are experiencing a medical emergency, please call 911 or report to your local emergency department or urgent care.   If you have a non-emergency medical problem during routine business hours, please contact your provider's office and ask to speak with a nurse.   For questions related to your Healthy [facility] health plan, please call: [phone] or visit the homepage here: [email or web address]  If you would like to schedule transportation through your Healthy [facility] plan, please call the following number at least 2 days in advance of your appointment: [phone]  Call the [place] at [phone], at any time, 24 hours a day, 7 days a week. If you are in danger or need immediate medical attention call 911.  If you would like help to quit smoking, call 1-800-QUIT-NOW 254-789-0481) OR Espaol: 1-855-Djelo-Ya (6-415-830-9407) o para ms informacin haga clic aqu or Text READY to 200-400 to register via text  [name] - following are the goals we discussed in your visit today:   Goals Addressed   None     Please see education materials related to CHF and CKD provided as print materials.   The patient verbalized understanding of instructions provided today and agreed to receive a mailed copy of patient instruction and/or educational materials.  Telephone follow up appointment with Managed Medicaid care management team member scheduled for:09/15/21 @ [place] RN, BSN [place] RN Care Coordinator   Following is a copy of your plan of care:  Care Plan : RN Care Manager Plan of Care  Updates made by [name], RN since  08/23/2021 12:00 AM     Problem: Health Management Needs Related to CHF and CKD      Long-Range Goal: Development of Plan of Care to Address Health Management Needs Related to CHF and CKD   Start Date: 08/23/2021  Expected End Date: 11/21/2021  Priority: High  Note:   Current Barriers:  Chronic Disease Management support and education needs related to 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 was recently hospitalized for 28 days for new diagnosis of CHF and CKD. She is feeling better now. She is attending dialysis M/W/F and is unable to work. 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 She is concerned about her finances. She is working at eating healthy, limiting her fluid and sodium intake and plans to attend all of her upcoming appointments.  RNCM Clinical Goal(s):  Patient will verbalize understanding of plan for management of CHF and CKD Stage 3-4 as evidenced by patient verbalization of self monitoring activities take all medications exactly as prescribed and will call provider for medication related questions as evidenced by documentation in EMR    attend all scheduled medical appointments: dialysis on M/W/F, 12/23 at 1:30p with Cardiology and 09/12/21 at 2:40 at [facility] as evidenced by provider documentation in EMR        demonstrate improved adherence to prescribed treatment plan for CHF and CKD Stage 3-4 as evidenced by provider documentation in EMR  through collaboration with RN Care manager, provider, and care team.   Interventions: Inter-disciplinary care team collaboration (see longitudinal plan of care) Evaluation of current treatment plan related to  self management and patient's adherence to plan as established by provider Advised patient to apply for disability on SSA.gov website Advised patient to schedule a follow up with PCP   Heart Failure Interventions:  (Status: New goal.)  Long Term Goal  Basic overview and discussion of pathophysiology of  Heart Failure reviewed Provided education on low sodium diet Discussed the importance of keeping all appointments with provider  Chronic Kidney Disease (Status: New goal.)  Long Term Goal  Last practice recorded BP readings:  BP Readings from Last 3 Encounters:  08/20/21 99/62  07/22/21 114/83  07/14/21 100/78  Most recent eGFR/CrCl: No results found for: EGFR  No components found for: CRCL  Assessed the patient     understanding of chronic kidney disease    Evaluation of current treatment plan related to chronic kidney disease self management and patient's adherence to plan as established by provider      Reviewed medications with patient and discussed importance of compliance    Reviewed scheduled/upcoming provider appointments including    Discussed plans with patient for ongoing care management follow up and provided patient with direct contact information for care management team    Assessed social determinant of health barriers    Support coping and stress management by recognizing current strategies and assist in developing new strategies such as mindfulness, journaling, relaxation techniques, problem-solving     Patient Goals/Self-Care Activities: Take medications as prescribed   Attend all scheduled provider appointments Call pharmacy for medication refills 3-7 days in advance of running out of medications Perform all self care activities independently  Perform IADL's (shopping, preparing meals, housekeeping, managing finances) independently Call provider office for new concerns or questions  call office if I gain more than 2 pounds in one day or 5 pounds in one week meet with dietitian track weight in diary use salt in moderation watch for swelling in feet, ankles and legs every day begin a heart failure diary eat more whole grains, fruits and vegetables, lean meats and healthy fats

## 2021-08-25 NOTE — Progress Notes (Addendum)
Cardiology Office Note    Date:  08/27/2021   ID:  Lauren Osborn, DOB [birth year], MRN [id number]  PCP:  [name], MD  Cardiologist: Rozann Lesches, MD    Chief Complaint  Patient presents with   Hospitalization Follow-up    History of Present Illness:    Lauren Osborn is a 45 y.o. female with with past medical history of HFpEF, GERD, Stage 3-4 CKD, gout, normocytic anemia and severe OSA who presents to the office today for hospital follow-up.  She most recently experienced a prolonged hospitalization at [facility] from 11/18 - 08/20/2021 for an acute CHF exacerbation. Did undergo a RHC which showed markedly elevated left and right filling pressures and moderate pulmonary hypertension with preserved cardiac output. She did not respond well with IV Lasix and renal function continued to worsen with minimal urinary output, therefore Nephrology was consulted and she was ultimately started on CRRT on 07/29/2021. Given that she remained anuric and without renal recovery, she was transitioned to dialysis on 08/11/2021 and her sessions were complicated given hypotension which improved with the use of Midodrine and Albumin. Her weight declined over 114 pounds during admission and was down to 202 lbs on the day of discharge.  In talking with the patient today, she reports overall feeling well since her recent hospitalization. Says her blood pressure has been soft during dialysis but they have been able to achieve her fluid goals. No associated dizziness. Her dyspnea has improved and she denies any specific orthopnea or PND. She has been utilizing her CPAP on a nightly basis. No exertional chest pain or palpitations. She does report pain along her ankles bilaterally which resembles the gout she experienced during admission.  Past Medical History:  Diagnosis Date   Abnormal bilirubin test    Cholelithiasis    Chronic kidney disease, stage 3b (HCC)    Diastolic congestive heart failure (HCC)     Esophagitis    Essential hypertension    Fibroids    Gastritis    GERD (gastroesophageal reflux disease)    Gout    Liver hemangioma    Morbid obesity ([place])    Normocytic anemia    OSA (obstructive sleep apnea)    Thyroid nodule    Tubular adenoma     Past Surgical History:  Procedure Laterality Date   AV FISTULA PLACEMENT Left 08/19/2021   Procedure: LEFT ARM ARTERIOVENOUS (AV) FISTULA;  Surgeon: Serafina Mitchell, MD;  Location: [facility];  Service: Vascular;  Laterality: Left;   BIOPSY  08/03/2020   Procedure: BIOPSY;  Surgeon: Eloise Harman, DO;  [facility];  Service: Endoscopy;;  duodenal gastric   CESAREAN SECTION     COLONOSCOPY WITH PROPOFOL N/A 08/03/2020    Surgeon: Hurshel Keys K, DO; 5 mm tubular adenoma in the sigmoid colon, nonbleeding internal hemorrhoids.  Recommended repeat in 5 years.   ESOPHAGOGASTRODUODENOSCOPY (EGD) WITH PROPOFOL N/A 08/03/2020   Surgeon: Eloise Harman, DO; LA grade C esophagitis without bleeding, gastritis with erythema and shallow ulcerations in gastric antrum biopsied (negative for H. pylori), normal examined duodenum s/p biopsy (benign).   HYSTERECTOMY ABDOMINAL WITH SALPINGECTOMY  2008   IR FLUORO GUIDE CV LINE RIGHT  08/15/2021   IR US GUIDE VASC ACCESS RIGHT  08/15/2021   POLYPECTOMY  08/03/2020   Procedure: POLYPECTOMY;  Surgeon: Eloise Harman, DO;  [facility];  Service: Endoscopy;;  colon   RIGHT HEART CATH N/A 07/26/2021   Procedure: RIGHT  HEART CATH;  Surgeon: Larey Dresser, MD;  Location: [facility];  Service: Cardiovascular;  Laterality: N/A;   THYROIDECTOMY Right 10/21/2018   Procedure: RIGHT HEMITHYROIDECTOMY;  Surgeon: Leta Baptist, MD;  Location: [facility];  Service: ENT;  Laterality: Right;    Current Medications: Outpatient Medications Prior to Visit  Medication Sig Dispense Refill   acetaminophen (TYLENOL) 500 MG tablet Take 1,000 mg by mouth every 6 (six)  hours as needed for moderate pain or headache.     midodrine (PROAMATINE) 5 MG tablet Take 3 tablets (15 mg total) by mouth 3 (three) times daily with meals. 270 tablet 2   pantoprazole (PROTONIX) 40 MG tablet Take 1 tablet (40 mg total) by mouth daily. 90 tablet 1   TART CHERRY PO Take by mouth daily.     No facility-administered medications prior to visit.     Allergies:   Oxycodone-acetaminophen   Social History   Socioeconomic History   Marital status: Single    Spouse name: Not on file   Number of children: Not on file   Years of education: Not on file   Highest education level: Not on file  Occupational History   Not on file  Tobacco Use   Smoking status: Former    Packs/day: 0.50    Years: 20.00    Pack years: 10.00    Types: Cigarettes    Quit date: 2017    Years since quitting: 5.9   Smokeless tobacco: Never  Vaping Use   Vaping Use: Never used  Substance and Sexual Activity   Alcohol use: Not Currently   Drug use: Never   Sexual activity: Yes    Comment: hyst  Other Topics Concern   Not on file  Social History Narrative   Not on file   Social Determinants of Health   Financial Resource Strain: Medium Risk   Difficulty of Paying Living Expenses: Somewhat hard  Food Insecurity: No Food Insecurity   Worried About Charity fundraiser in the Last Year: Never true   Ran Out of Food in the Last Year: Never true  Transportation Needs: No Transportation Needs   Lack of Transportation (Medical): No   Lack of Transportation (Non-Medical): No  Physical Activity: Not on file  Stress: Not on file  Social Connections: Not on file     Family History:  The patient's family history includes Breast cancer in her mother; Colon cancer (age of onset: 3) in her father; Colon polyps (age of onset: 69) in her sister; Congestive Heart Failure in her maternal grandfather; Diabetes in her daughter; Diverticulitis in her brother and sister; Other in her maternal grandmother and  paternal grandfather.   Review of Systems:    Please see the history of present illness.     All other systems reviewed and are otherwise negative except as noted above.   Physical Exam:    VS:  BP 92/64    Pulse 77    Ht 5\' 3"  (1.6 m)    Wt 201 lb (91.2 kg)    SpO2 93%    BMI 35.61 kg/m    General: Well developed, well nourished,female appearing in no acute distress. Head: Normocephalic, atraumatic. Neck: No carotid bruits. JVD not elevated.  Lungs: Respirations regular and unlabored, without wheezes or rales.  Heart: Regular rate and rhythm. No S3 or S4.  No murmur, no rubs, or gallops appreciated. Abdomen: Appears non-distended. No obvious abdominal masses. Msk:  Strength and tone  appear normal for age. No obvious joint deformities or effusions. Extremities: No clubbing or cyanosis. Trace ankle edema bilaterally.  Distal pedal pulses are 2+ bilaterally. Fistula site along LUE appears well-healing.  Neuro: Alert and oriented X 3. Moves all extremities spontaneously. No focal deficits noted. Psych:  Responds to questions appropriately with a normal affect. Skin: No rashes or lesions noted  Wt Readings from Last 3 Encounters:  08/26/21 201 lb (91.2 kg)  08/20/21 199 lb 8.3 oz (90.5 kg)  07/22/21 (!) 310 lb 3.2 oz (140.7 kg)     Studies/Labs Reviewed:   EKG:  EKG is not ordered today.   Recent Labs: 07/22/2021: B Natriuretic Peptide 510.0 07/23/2021: ALT 16; TSH 5.189 08/12/2021: Magnesium 2.2 08/20/2021: BUN 51; Creatinine, Ser 3.81; Hemoglobin 10.0; Platelets 261; Potassium 5.0; Sodium 126   Lipid Panel No results found for: CHOL, TRIG, HDL, CHOLHDL, VLDL, LDLCALC, LDLDIRECT  Additional studies/ records that were reviewed today include:   Echocardiogram: 07/2021 IMPRESSIONS     1. Left ventricular ejection fraction, by estimation, is 60 to 65%. The  left ventricle has normal function. The left ventricle has no regional  wall motion abnormalities. Left ventricular  diastolic parameters were  normal.   2. Right ventricular systolic function is normal. The right ventricular  size is normal.   3. Left atrial size was mildly dilated.   4. The mitral valve is normal in structure. No evidence of mitral valve  regurgitation. No evidence of mitral stenosis.   5. The aortic valve is tricuspid. Aortic valve regurgitation is not  visualized. Aortic valve sclerosis is present, with no evidence of aortic  valve stenosis.   RHC: 07/2021 1. Markedly elevated left and right heart filling pressures.  2. Moderate pulmonary venous hypertension.  3. Preserved cardiac output.    Assessment:    1. Chronic heart failure with preserved ejection fraction ([place])   2. ESRD (end stage renal disease) ([place])   3. Pulmonary hypertension, unspecified ([place])   4. OSA (obstructive sleep apnea)   5. 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 HFpEF - She failed IV Lasix during admission and given minimal urinary output, was transitioned to dialysis. She is now on hemodialysis (MWF schedule) and is overall tolerating her sessions well. Will defer volume management to Nephrology since she is now on HD. Remains on Midodrine 15mg  TID for BP support.  - She does have a New Patient appointment with [facility] in 09/2021 but was followed by the CHF Team during her recent admission. I will send a note to their office today to see if her appointment date/time need to be adjusted.   2. ESRD - HD initiated during her recent admission. Followed by Dr. Theador Hawthorne.   3. Pulmonary HTN/OSA - Recent RHC showed moderate pulmonary hypertension with preserved cardiac output. Continued compliance with CPAP encouraged.   4. Gout - She has experienced recurrent gout following recent Prednisone taper during admission. As suggested during admission, will start Allopurinol 100 mg three times weekly. By review  of Up-to-Date, it is recommended to take this in the afternoons following HD and we reviewed this today.    Medication Adjustments/Labs and Tests Ordered: Current medicines are reviewed at length with the patient today.  Concerns regarding medicines are outlined above.  Medication changes, Labs and Tests ordered today are listed in the Patient Instructions below. Patient Instructions  Medication  Instructions:  Take allopurinol for gout as directed   Labwork: None today   Testing/Procedures: None    Follow-Up: 6 months Dr.McDowell  Any Other Special Instructions Will Be Listed Below (If Applicable).  If you need a refill on your cardiac medications before your next appointment, please call your pharmacy.    Signed, Erma Heritage, PA-C  08/27/2021 9:15 AM    [facility] [address] [place],  [address] Phone: [phone] Fax: [phone]

## 2021-08-26 ENCOUNTER — Encounter: Payer: Self-pay | Admitting: Student

## 2021-08-26 ENCOUNTER — Ambulatory Visit (INDEPENDENT_AMBULATORY_CARE_PROVIDER_SITE_OTHER): Payer: Managed Care, Other (non HMO) | Admitting: Student

## 2021-08-26 ENCOUNTER — Other Ambulatory Visit: Payer: Self-pay

## 2021-08-26 VITALS — BP 92/64 | HR 77 | Ht 63.0 in | Wt 201.0 lb

## 2021-08-26 DIAGNOSIS — I272 Pulmonary hypertension, unspecified: Secondary | ICD-10-CM | POA: Diagnosis not present

## 2021-08-26 DIAGNOSIS — M1A3790 Chronic gout due to renal impairment, unspecified ankle and foot, without tophus (tophi): Secondary | ICD-10-CM

## 2021-08-26 DIAGNOSIS — N186 End stage renal disease: Secondary | ICD-10-CM

## 2021-08-26 DIAGNOSIS — G4733 Obstructive sleep apnea (adult) (pediatric): Secondary | ICD-10-CM | POA: Diagnosis not present

## 2021-08-26 DIAGNOSIS — I5032 Chronic diastolic (congestive) heart failure: Secondary | ICD-10-CM | POA: Diagnosis not present

## 2021-08-26 MED ORDER — ALLOPURINOL 100 MG PO TABS: 100.0000 mg | ORAL_TABLET | ORAL | 3 refills | Status: AC

## 2021-08-26 NOTE — Patient Instructions (Signed)
Medication Instructions:  Take allopurinol for gout as directed   Labwork: None today   Testing/Procedures: None    Follow-Up: 6 months Dr.McDowell  Any Other Special Instructions Will Be Listed Below (If Applicable).  If you need a refill on your cardiac medications before your next appointment, please call your pharmacy.

## 2021-09-06 ENCOUNTER — Telehealth: Payer: Self-pay | Admitting: Gastroenterology

## 2021-09-06 NOTE — Telephone Encounter (Signed)
Patient was last seen in our office 07/22/2021 for RUQ abdominal pain, GERD, normocytic anemia, rectal bleeding, shortness of breath, significant anasarca.  Abdominal ultrasound ordered and revealed hepatic cirrhosis and moderate volume ascites which was a new finding.  Also with cholelithiasis with some degree of gallbladder wall thickening.  Laboratory evaluation was remarkable for acute kidney injury, creatinine up to 2.75.  Her hemoglobin was stable, LFTs, and lipase normal.  I recommended proceeding to the emergency room.  She ultimately underwent prolonged admission at [facility] from 11/18-12/17.  She did not respond to IV diuretics and renal function worsened, started on CRRT, and later transitioned to dialysis and had significant weight loss.  She needs to be scheduled for follow-up to regroup and follow-up on new diagnosis of ?cirrhosis.   Erline Levine, please arrange follow-up visit with Dr. Abbey Chatters in the next 4-6 weeks. ?Cirrhosis, anasarca, RUQ pain, normocytic anemia.

## 2021-09-09 ENCOUNTER — Other Ambulatory Visit: Payer: Self-pay

## 2021-09-09 DIAGNOSIS — N186 End stage renal disease: Secondary | ICD-10-CM

## 2021-09-12 ENCOUNTER — Other Ambulatory Visit: Payer: Self-pay

## 2021-09-12 ENCOUNTER — Ambulatory Visit (HOSPITAL_COMMUNITY)
Admission: RE | Admit: 2021-09-12 | Discharge: 2021-09-12 | Disposition: A | Discharge status: Home / Self Care | Payer: Medicaid Other | Source: Ambulatory Visit | Attending: Cardiology | Admitting: Cardiology

## 2021-09-12 ENCOUNTER — Other Ambulatory Visit: Payer: Self-pay | Admitting: Student

## 2021-09-12 ENCOUNTER — Encounter (HOSPITAL_COMMUNITY): Payer: Self-pay | Admitting: Cardiology

## 2021-09-12 VITALS — BP 100/72 | HR 83 | Ht 63.0 in | Wt 205.6 lb

## 2021-09-12 DIAGNOSIS — Z9989 Dependence on other enabling machines and devices: Secondary | ICD-10-CM | POA: Insufficient documentation

## 2021-09-12 DIAGNOSIS — N186 End stage renal disease: Secondary | ICD-10-CM | POA: Insufficient documentation

## 2021-09-12 DIAGNOSIS — G4733 Obstructive sleep apnea (adult) (pediatric): Secondary | ICD-10-CM | POA: Diagnosis not present

## 2021-09-12 DIAGNOSIS — K7460 Unspecified cirrhosis of liver: Secondary | ICD-10-CM | POA: Insufficient documentation

## 2021-09-12 DIAGNOSIS — I5032 Chronic diastolic (congestive) heart failure: Secondary | ICD-10-CM | POA: Diagnosis not present

## 2021-09-12 DIAGNOSIS — N179 Acute kidney failure, unspecified: Secondary | ICD-10-CM | POA: Insufficient documentation

## 2021-09-12 DIAGNOSIS — I132 Hypertensive heart and chronic kidney disease with heart failure and with stage 5 chronic kidney disease, or end stage renal disease: Secondary | ICD-10-CM | POA: Diagnosis not present

## 2021-09-12 DIAGNOSIS — I272 Pulmonary hypertension, unspecified: Secondary | ICD-10-CM | POA: Insufficient documentation

## 2021-09-12 DIAGNOSIS — Z79899 Other long term (current) drug therapy: Secondary | ICD-10-CM | POA: Diagnosis not present

## 2021-09-12 NOTE — Progress Notes (Signed)
PCP: [name], MD Cardiology: Dr. Domenic Polite HF cardiology: Dr. Aundra Dubin  46 y.o. with history of chronic diastolic CHF with prominent RV dysfunction and ESRD presents for followup of CHF.  Admitted 06/25/21 with volume overload. Diuresed with IV lasix and transitioned to torsemide 80 mg/60 mg + metolazone 3 times a week. D/c weight 308 pounds which was unchanged from admit.  Weight had been over 300 pounds for at least a year, chronic lower edema. Uses motorized scooter in [place]. Worked as Presenter, broadcasting.   Presented to [facility] with RUQ and increased dyspnea in 11/22. CXR no acute process. Diuresed with IV lasix but stopped due to elevated creatinine. VQ - negative PE. Lower extremity doppler negative for DVT. Echo in 11/22 showed EF 60-65%, normal RV systolic function with mild dilation, D-shaped septum consistent with RV pressure/volume overload.  Creatinine gradually worsened in the hospital with intractable volume overload despite use of milrinone to try to support RV, and patient was started on CVVH and eventually iHD.  She lost > 100 lbs in the hospital.  Dialysis was complicated by soft BP and midodrine was started.   Patient has been stable at home.  Weight has increased a few lbs.  She has occasional low BP at HD but does not get lightheaded and she has been able to finish her HD sessions. No falls or syncope.  She is driving.  She does not get short of breath or chest pain when walking on flat ground.  Energy level is ok. She has been hoarse for about 3 months.   PMH: 1. Chronic diastolic CHF: With RV failure.  - RHC (11/22) with mean RA 35, PA 58/29 mean 46, mean PCWP 42, CI 3.01, PVR < 1 WU - Echo (11/22):  EF 60-65%, normal RV systolic function with mild dilation, D-shaped septum consistent with RV pressure/volume overload. 2. GERD 3. ESRD 4. Gout 5. OSA: Uses CPAP 6. Cirrhosis: Viral hepatitis labs negative, not a heavy drinker. ?due to RV failure.  7. Anemia of renal disease.  8.  Cholelithiasis 9. Fibroids  Social History   Socioeconomic History   Marital status: Single    Spouse name: Not on file   Number of children: Not on file   Years of education: Not on file   Highest education level: Not on file  Occupational History   Not on file  Tobacco Use   Smoking status: Former    Packs/day: 0.50    Years: 20.00    Pack years: 10.00    Types: Cigarettes    Quit date: 2017    Years since quitting: 6.0   Smokeless tobacco: Never  Vaping Use   Vaping Use: Never used  Substance and Sexual Activity   Alcohol use: Not Currently   Drug use: Never   Sexual activity: Yes    Comment: hyst  Other Topics Concern   Not on file  Social History Narrative   Not on file   Social Determinants of Health   Financial Resource Strain: Medium Risk   Difficulty of Paying Living Expenses: Somewhat hard  Food Insecurity: No Food Insecurity   Worried About Charity fundraiser in the Last Year: Never true   Ran Out of Food in the Last Year: Never true  Transportation Needs: No Transportation Needs   Lack of Transportation (Medical): No   Lack of Transportation (Non-Medical): No  Physical Activity: Not on file  Stress: Not on file  Social Connections: Not on file  Intimate Partner  Violence: Not on file   Family History  Problem Relation Age of Onset   Breast cancer Mother    Other Paternal Grandfather        house fire   Other Maternal Grandmother        house fire   Congestive Heart Failure Maternal Grandfather    Colon cancer Father 38   Diverticulitis Brother    Diverticulitis Sister    Colon polyps Sister 64   Diabetes Daughter        borderline   ROS: All systems reviewed and negative except as per HPI.   Current Outpatient Medications  Medication Sig Dispense Refill   acetaminophen (TYLENOL) 500 MG tablet Take 1,000 mg by mouth every 6 (six) hours as needed for moderate pain or headache.     allopurinol (ZYLOPRIM) 100 MG tablet Take 1 tablet (100 mg  total) by mouth 3 (three) times a week. 36 tablet 3   midodrine (PROAMATINE) 5 MG tablet Take 3 tablets (15 mg total) by mouth 3 (three) times daily with meals. 270 tablet 2   pantoprazole (PROTONIX) 40 MG tablet Take 1 tablet (40 mg total) by mouth daily. 90 tablet 1   TART CHERRY PO Take by mouth daily.     No current facility-administered medications for this encounter.   BP 100/72    Pulse 83    Ht 5\' 3"  (1.6 m)    Wt 93.3 kg (205 lb 9.6 oz)    SpO2 99%    BMI 36.42 kg/m  General: NAD Neck: JVP 10-12 cm, no thyromegaly or thyroid nodule.  Lungs: Clear to auscultation bilaterally with normal respiratory effort. CV: Nondisplaced PMI.  Heart regular S1/S2, no S3/S4, no murmur.  1+ ankle edema.  No carotid bruit.  Normal pedal pulses.  Abdomen: Soft, nontender, no hepatosplenomegaly, no distention.  Skin: Intact without lesions or rashes.  Neurologic: Alert and oriented x 3.  Psych: Normal affect. Extremities: No clubbing or cyanosis.  HEENT: Normal.   Assessment/Plan: 1. Chronic diastolic CHF: With prominent RV dysfunction.  Echo from 11/22 showed EF 60-65%, normal RV systolic function with mild dilation, D-shaped septum consistent with RV pressure/volume overload. She was admitted in 11/22 markedly volume overloaded with anasarca.  Unfortunately, patient developed worsening AKI with attempted diuresis. RHC  w/ markedly elevated left and right filling pressure, mod pulmonary venous HTN and preserved CO. PAPi low at 0.82. Minimal response to maximal diuretics and milrinone 0.125, CVVH started and eventually transitioned to iHD.  On exam today, she is mildly volume overloaded.  NYHA class II symptoms.  - Continue midodrine 15 mg tid to keep BP up while dialyzing.  - Think her dry weight needs to be lowered gently.   2. ESRD: BP runs low but tolerating iHD with midodrine.  3. Cirrhosis: Uncertain etiology, labs negative for viral hepatitis.  Not a heavy drinker.  May be due to NAFLD or RV  failure.  4. OSA: Continue CPAP at night.   She can followup in this office prn, she will continue to see Dr. Domenic Polite in [place].   Lauren Osborn 09/13/2021

## 2021-09-12 NOTE — Patient Instructions (Signed)
Thank you for your visit today.  There has been no changes with your medication.  You have been scheduled to follow up with Dr. Gwen Her as scheduled    If you have any questions or concerns before your next appointment please send [place] a message through [facility] or call our office at [phone].    TO LEAVE A MESSAGE FOR THE NURSE SELECT OPTION 2, PLEASE LEAVE A MESSAGE INCLUDING: YOUR NAME DATE OF BIRTH CALL BACK NUMBER REASON FOR CALL**this is important as we prioritize the call backs  YOU WILL RECEIVE A CALL BACK THE SAME DAY AS LONG AS YOU CALL BEFORE 4:00 PM  At the [facility], you and your health needs are our priority. As part of our continuing mission to provide you with exceptional heart care, we have created designated Provider Care Teams. These Care Teams include your primary Cardiologist (physician) and Advanced Practice Providers (APPs- Physician Assistants and Nurse Practitioners) who all work together to provide you with the care you need, when you need it.   You may see any of the following providers on your designated Care Team at your next follow up: Dr Glori Bickers Dr Haynes Kerns, NP Lyda Jester, [place] [facility] [place], [place] Audry Riles, PharmD   Please be sure to bring in all your medications bottles to every appointment.

## 2021-09-13 ENCOUNTER — Telehealth (HOSPITAL_COMMUNITY): Payer: Self-pay | Admitting: *Deleted

## 2021-09-13 NOTE — Telephone Encounter (Signed)
Yes, ok to have peritoneal dialysis catheter placed.

## 2021-09-13 NOTE — Telephone Encounter (Signed)
Pt left vm asking if her heart was strong enough to have surgery to have Peritoneal dialysis.  Routed to [place] for advice

## 2021-09-13 NOTE — Telephone Encounter (Signed)
Pt aware and thanked me for the call.  

## 2021-09-15 ENCOUNTER — Other Ambulatory Visit: Payer: Self-pay | Admitting: *Deleted

## 2021-09-15 ENCOUNTER — Other Ambulatory Visit: Payer: Self-pay

## 2021-09-15 DIAGNOSIS — Z599 Problem related to housing and economic circumstances, unspecified: Secondary | ICD-10-CM

## 2021-09-15 NOTE — Patient Instructions (Addendum)
Visit Information  Ms. Nicolls was given information about Medicaid Managed Care team care coordination services as a part of their [facility] benefit. ANNORA GUDERIAN verbally consented to engagement with the [facility] Managed Care team.   If you are experiencing a medical emergency, please call 911 or report to your local emergency department or urgent care.   If you have a non-emergency medical problem during routine business hours, please contact your provider's office and ask to speak with a nurse.   For questions related to your Healthy [facility] health plan, please call: [phone] or visit the homepage here: [email or web address]  If you would like to schedule transportation through your Healthy [facility] plan, please call the following number at least 2 days in advance of your appointment: [phone]  Call the [facility] at [phone], at any time, 24 hours a day, 7 days a week. If you are in danger or need immediate medical attention call 911.  If you would like help to quit smoking, call 1-800-QUIT-NOW 3393653504) OR Espaol: 1-855-Djelo-Ya (5-320-233-4356) o para ms informacin haga clic aqu or Text READY to 200-400 to register via text  Ms. Vitt[place]ow,   Please see education materials related to CHF and CKD provided as print materials.   The patient verbalized understanding of instructions provided today and agreed to receive a mailed copy of patient instruction and/or educational materials.  Telephone follow up appointment with Managed Medicaid care management team member scheduled for:10/17/21 @ [place] RN, [place] RN Care Coordinator   Following is a copy of your plan of care:  Care Plan : RN Care Manager Plan of Care  Updates made by [name], RN since 09/15/2021 12:00 AM     Problem: Health Management Needs Related to CHF and CKD       Long-Range Goal: Development of Plan of Care to Address Health Management Needs Related to CHF and CKD   Start Date: 08/23/2021  Expected End Date: 11/21/2021  Priority: High  Note:   Current Barriers:  Chronic Disease Management support and education needs related to 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 Seeber was recently hospitalized for 28 days for new diagnosis of CHF and CKD. She is feeling better now. She is attending dialysis M/W/F and is unable to work. Ms. Castoro has filed for disability and waiting for determination. She has requested an ENT referral for congestion and changes in her voice. She is aware of all upcoming appointments.  RNCM Clinical Goal(s):  Patient will verbalize understanding of plan for management of CHF and CKD Stage 3-4 as evidenced by patient verbalization of self monitoring activities take all medications exactly as prescribed and will call provider for medication related questions as evidenced by documentation in EMR    attend all scheduled medical appointments: dialysis on M/W/F, 12/23 at 1:30p with Cardiology and 09/12/21 at 2:40 at [facility] as evidenced by provider documentation in EMR        demonstrate improved adherence to prescribed treatment plan for CHF and CKD Stage 3-4 as evidenced by provider documentation in EMR  through collaboration with RN Care manager, provider, and care team.   Interventions: Inter-disciplinary care team collaboration (see longitudinal plan of care) Evaluation of current treatment plan related to  self management and patient's adherence to plan as established by provider  Heart Failure Interventions:  (Status: Goal on Track (progressing): YES.)  Long Term Goal  Discussed the importance  of keeping all appointments with provider Referral made to community resources care guide team for assistance with financial insecurity and needing a dental provider Assessed social determinant of health barriers  Chronic Kidney Disease (Status: Goal  on Track (progressing): YES.)  Long Term Goal  Last practice recorded BP readings:  BP Readings from Last 3 Encounters:  09/12/21 100/72  08/26/21 92/64  08/20/21 99/62  Most recent eGFR/CrCl: No results found for: EGFR  No components found for: CRCL  Provided education to patient re: stroke prevention, s/s of heart attack and stroke    Reviewed medications with patient and discussed importance of compliance    Reviewed scheduled/upcoming provider appointments including    Discussed plans with patient for ongoing care management follow up and provided patient with direct contact information for care management team    Discussed the impact of chronic kidney disease on daily life and mental health and acknowledged and normalized feelings of disempowerment, fear, and frustration    Assessed social determinant of health barriers    Support coping and stress management by recognizing current strategies and assist in developing new strategies such as mindfulness, journaling, relaxation techniques, problem-solving     Patient Goals/Self-Care Activities: Take medications as prescribed   Attend all scheduled provider appointments Call pharmacy for medication refills 3-7 days in advance of running out of medications Perform all self care activities independently  Perform IADL's (shopping, preparing meals, housekeeping, managing finances) independently Call provider office for new concerns or questions  call office if I gain more than 2 pounds in one day or 5 pounds in one week meet with dietitian track weight in diary use salt in moderation watch for swelling in feet, ankles and legs every day begin a heart failure diary eat more whole grains, fruits and vegetables, lean meats and healthy fats

## 2021-09-15 NOTE — Patient Outreach (Signed)
Medicaid Managed Care   Nurse Care Manager Note  09/15/2021 Name:  Lauren Osborn MRN:  [id number] DOB:  Jan 02, 1976  Lauren Osborn is an 46 y.o. year old female who is [facility] primary patient of Lauren Fire, MD.  The [facility] Managed Care Coordination team was consulted for assistance with:    CHF CKD Stage3-4  Lauren Osborn was given information about Medicaid Managed Care Coordination team services today. Lauren Osborn Patient agreed to services and verbal consent obtained.  Engaged with patient by telephone for follow up visit in response to provider referral for case management and/or care coordination services.   Assessments/Interventions:  Review of past medical history, allergies, medications, health status, including review of consultants reports, laboratory and other test data, was performed as part of comprehensive evaluation and provision of chronic care management services.  SDOH (Social Determinants of Health) assessments and interventions performed: SDOH Interventions    Flowsheet Row Most Recent Value  SDOH Interventions   Financial Strain Interventions Other (Comment)  [Care Guide referral placed]       Care Plan  Allergies  Allergen Reactions   Oxycodone-Acetaminophen Hives and Other (See Comments)    Medications Reviewed Today     Reviewed by Lauren Montane, RN (Registered Nurse) on 09/15/21 at [id number]  Med List Status: <None>   Medication Order Taking? Sig Documenting Provider Last Dose Status Informant  acetaminophen (TYLENOL) 500 MG tablet [id number] Yes Take 1,000 mg by mouth every 6 (six) hours as needed for moderate pain or headache. [provider] Taking Active Self  allopurinol (ZYLOPRIM) 100 MG tablet [id number] Yes Take 1 tablet (100 mg total) by mouth 3 (three) times [facility] week. Lauren Heritage, PA-C Taking Active   midodrine (PROAMATINE) 5 MG tablet [id number] Yes Take 3 tablets (15 mg total) by mouth 3 (three) times daily with meals. Lauren Heritage, PA-C Taking Active   pantoprazole (PROTONIX) 40 MG tablet [id number] Yes Take 1 tablet (40 mg total) by mouth daily. Lauren Heritage, PA-C Taking Active   TART CHERRY PO [id number] Yes Take by mouth daily. [provider] Taking Active Self           Med Note Lauren Osborn, Lauren Osborn   Thu Sep 15, 2021  9:18 AM) Taking on non dialysis days            Patient Active Problem List   Diagnosis Date Noted   Abdominal pain 07/23/2021   Vitamin D deficiency 07/23/2021   Hepatic cirrhosis ([place]) 07/23/2021   Hepatic hemangioma 07/23/2021   Right upper quadrant abdominal pain 07/22/2021   Rectal bleeding 07/22/2021   Shortness of breath 07/22/2021   Acute exacerbation of CHF (congestive heart failure) ([place]) 07/22/2021   CKD (chronic kidney disease), stage IV ([place]) 06/23/2021   Liver lesion 12/01/2020   Liver disease 12/01/2020   Elevated bilirubin 12/01/2020   OSA (obstructive sleep apnea) 11/16/2020   Acute on chronic heart failure with preserved ejection fraction (HFpEF) ([place]) 09/22/2020   Morbid obesity with BMI of 50.0-59.9, adult ([place]) 09/22/2020   Pulmonary edema 09/16/2020   Hypokalemia 09/16/2020   Prolonged QT interval 09/16/2020   Acute kidney injury ([place]) 09/16/2020   Elevated brain natriuretic peptide (BNP) level 09/16/2020   GERD (gastroesophageal reflux disease) 09/16/2020   Anasarca 09/16/2020   Super obese 09/16/2020   Normocytic anemia 04/23/2020   Family history of colon cancer 04/23/2020   Mass of upper outer quadrant of right breast 11/04/2019   S/P partial  thyroidectomy 10/21/2018   Visit for routine gyn exam 07/03/2018    Conditions to be addressed/monitored per PCP order:  CHF and CKD Stage 3-4  Care Plan : Lauren Osborn of Care  Updates made by Lauren Montane, RN since 09/15/2021 12:00 AM     Problem: Health Management Needs Related to CHF and CKD      Long-Range Goal: Development of Plan of Care to Address Health Management  Needs Related to CHF and CKD   Start Date: 08/23/2021  Expected End Date: 11/21/2021  Priority: High  Note:   Current Barriers:  Chronic Disease Management support and education needs related to 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 was recently hospitalized for 28 days for new diagnosis of CHF and CKD. She is feeling better now. She is attending dialysis M/W/F and is unable to work. [name] has filed for disability and waiting for determination. She has requested an ENT referral for congestion and changes in her voice. She is aware of all upcoming appointments.  RNCM Clinical Goal(s):  Patient will verbalize understanding of plan for management of CHF and CKD Stage 3-4 as evidenced by patient verbalization of self monitoring activities take all medications exactly as prescribed and will call provider for medication related questions as evidenced by documentation in EMR    attend all scheduled medical appointments: dialysis on M/W/F, 12/23 at 1:30p with Cardiology and 09/12/21 at 2:40 at [facility] as evidenced by provider documentation in EMR        demonstrate improved adherence to prescribed treatment plan for CHF and CKD Stage 3-4 as evidenced by provider documentation in EMR  through collaboration with RN Care manager, provider, and care team.   Interventions: Inter-disciplinary care team collaboration (see longitudinal plan of care) Evaluation of current treatment plan related to  self management and patient's adherence to plan as established by provider  Heart Failure Interventions:  (Status: Goal on Track (progressing): YES.)  Long Term Goal  Discussed the importance of keeping all appointments with provider Referral made to community resources care guide team for assistance with financial insecurity and needing [facility] dental provider Assessed social determinant of health barriers  Chronic Kidney Disease (Status: Goal on Track (progressing): YES.)  Long Term Goal  Last practice recorded BP  readings:  BP Readings from Last 3 Encounters:  09/12/21 100/72  08/26/21 92/64  08/20/21 99/62  Most recent eGFR/CrCl: No results found for: EGFR  No components found for: CRCL  Provided education to patient re: stroke prevention, s/s of heart attack and stroke    Reviewed medications with patient and discussed importance of compliance    Reviewed scheduled/upcoming provider appointments including    Discussed plans with patient for ongoing care management follow up and provided patient with direct contact information for care management team    Discussed the impact of chronic kidney disease on daily life and mental health and acknowledged and normalized feelings of disempowerment, fear, and frustration    Assessed social determinant of health barriers    Support coping and stress management by recognizing current strategies and assist in developing new strategies such as mindfulness, journaling, relaxation techniques, problem-solving     Patient Goals/Self-Care Activities: Take medications as prescribed   Attend all scheduled provider appointments Call pharmacy for medication refills 3-7 days in advance of running out of medications Perform all self care activities independently  Perform IADL's (shopping, preparing meals, housekeeping, managing finances) independently Call provider office for new concerns or questions  call office if I gain more than 2 pounds in one day or 5 pounds in one week meet with dietitian track weight in diary use salt in moderation watch for swelling in feet, ankles and legs every day begin [facility] heart failure diary eat more whole grains, fruits and vegetables, lean meats and healthy fats       Follow Up:  Patient agrees to Care Plan and Follow-up.  Plan: The Managed Medicaid care management team will reach out to the patient again over the next 30 days.  Date/time of next scheduled RN care management/care coordination outreach:  10/17/21 @ [place] RN, [employer] RN Care Coordinator

## 2021-09-19 ENCOUNTER — Ambulatory Visit (HOSPITAL_COMMUNITY)
Admission: RE | Admit: 2021-09-19 | Discharge: 2021-09-19 | Disposition: A | Discharge status: Home / Self Care | Payer: Medicaid Other | Source: Ambulatory Visit | Attending: Surgery | Admitting: Surgery

## 2021-09-19 ENCOUNTER — Ambulatory Visit (INDEPENDENT_AMBULATORY_CARE_PROVIDER_SITE_OTHER): Payer: Medicaid Other | Admitting: Physician Assistant

## 2021-09-19 ENCOUNTER — Other Ambulatory Visit: Payer: Self-pay

## 2021-09-19 VITALS — BP 86/60 | HR 92 | Temp 98.2°F | Resp 20 | Ht 63.0 in | Wt 204.7 lb

## 2021-09-19 DIAGNOSIS — D509 Iron deficiency anemia, unspecified: Secondary | ICD-10-CM | POA: Insufficient documentation

## 2021-09-19 DIAGNOSIS — Z992 Dependence on renal dialysis: Secondary | ICD-10-CM | POA: Insufficient documentation

## 2021-09-19 DIAGNOSIS — N186 End stage renal disease: Secondary | ICD-10-CM | POA: Diagnosis not present

## 2021-09-19 DIAGNOSIS — I10 Essential (primary) hypertension: Secondary | ICD-10-CM | POA: Insufficient documentation

## 2021-09-19 DIAGNOSIS — I517 Cardiomegaly: Secondary | ICD-10-CM | POA: Insufficient documentation

## 2021-09-19 NOTE — Progress Notes (Signed)
°  POST OPERATIVE OFFICE NOTE    CC:  F/u for surgery  HPI:  This is a 46 y.o. female who is s/p left BC AVF on 08/19/2022 by Dr. Trula Slade.   Pt states she does not have pain/numbness in the left hand.   She states that she saw her kidney doctor today and he did not recognize her bc she has lost more than 100lbs of fluid since starting HD.  She states she feels much better and her breathing is better.   The pt is on dialysis M/W/F at [place] location in [place].  She has a TDC that was placed by IR on 08/15/2021.   Allergies  Allergen Reactions   Oxycodone-Acetaminophen Hives and Other (See Comments)    Current Outpatient Medications  Medication Sig Dispense Refill   acetaminophen (TYLENOL) 500 MG tablet Take 1,000 mg by mouth every 6 (six) hours as needed for moderate pain or headache.     allopurinol (ZYLOPRIM) 100 MG tablet Take 1 tablet (100 mg total) by mouth 3 (three) times a week. 36 tablet 3   midodrine (PROAMATINE) 5 MG tablet Take 3 tablets (15 mg total) by mouth 3 (three) times daily with meals. 270 tablet 2   pantoprazole (PROTONIX) 40 MG tablet Take 1 tablet (40 mg total) by mouth daily. 90 tablet 1   TART CHERRY PO Take by mouth daily.     No current facility-administered medications for this visit.     ROS:  See HPI  Physical Exam:  Today's Vitals   09/19/21 1448  BP: (!) 86/60  Pulse: 92  Resp: 20  Temp: 98.2 F (36.8 C)  TempSrc: Temporal  SpO2: 91%  Weight: 204 lb 11.2 oz (92.9 kg)  Height: 5\' 3"  (1.6 m)   Body mass index is 36.26 kg/m.   Incision:  healing nicely Extremities:   There is a palpable left radial pulse.   Motor and sensory are in tact.   There is a thrill/bruit present.  The fistula is palpable but less so proximally;  it is somewhat pulsatile as well.     Dialysis Duplex on 09/19/2021: Diameter:  0.49-0.89cm Depth:  0.51-0.67cm There are a couple of branches in the upper arm   Assessment/Plan:  This is a 46 y.o. female who is  s/p: eft BC AVF on 08/19/2022 by Dr. Trula Slade  -the pt does not have evidence of steal. -the fistula is maturing nicely on duplex, however it is more difficult to palpate proximally.  It does have a little pulsatility to it as well.  She states that she is being considered for peritoneal dialysis.   -the pt will follow up in 6 weeks with repeat duplex.  I have instructed her to exercise her hand.  She may need fistulogram or superficialization of fistula.  Also at that time, we can discuss if she was candidate for peritoneal dialysis.  She is in agreement with this plan.    Leontine Locket, [facility] [phone]  Clinic MD:  Trula Slade

## 2021-09-20 ENCOUNTER — Encounter: Payer: Self-pay | Admitting: Orthopedic Surgery

## 2021-09-20 ENCOUNTER — Ambulatory Visit (INDEPENDENT_AMBULATORY_CARE_PROVIDER_SITE_OTHER): Payer: Managed Care, Other (non HMO) | Admitting: Orthopedic Surgery

## 2021-09-20 ENCOUNTER — Other Ambulatory Visit: Payer: Self-pay | Admitting: *Deleted

## 2021-09-20 ENCOUNTER — Ambulatory Visit: Payer: Medicaid Other

## 2021-09-20 VITALS — BP 111/86 | HR 91 | Ht 63.0 in | Wt 208.8 lb

## 2021-09-20 DIAGNOSIS — M79605 Pain in left leg: Secondary | ICD-10-CM

## 2021-09-20 DIAGNOSIS — R2242 Localized swelling, mass and lump, left lower limb: Secondary | ICD-10-CM

## 2021-09-20 DIAGNOSIS — Z992 Dependence on renal dialysis: Secondary | ICD-10-CM

## 2021-09-20 DIAGNOSIS — N186 End stage renal disease: Secondary | ICD-10-CM

## 2021-09-20 NOTE — Progress Notes (Signed)
New Patient Visit  Assessment: Lauren Osborn is a 46 y.o. female with the following: 1. Lower leg mass, left  Plan: Patient has had soft tissue mass in the left lower extremity for many years.  Size is remained essentially constant.  It has become more prominent recently.  She would like to know what it is.  If amenable to surgery, she is interested in having it removed.  Based on the appearance, as well as the stability of the growth, a lipoma is high on the differential.  Radiographs are consistent with this diagnosis.  We will obtain an MRI for further characterization of the mass.  Patient is on dialysis, so we will avoid contrast.  Follow-up after the MRI.   Follow-up: Return for After MRI.  Subjective:  Chief Complaint  Patient presents with   Mass    LLE/ pt states it has it has been there ever since she was a teenager, but due to health issues, she has lost 100lbs recently and it is more prominent. **not painful**    History of Present Illness: Lauren Osborn is a 46 y.o. female who has been referred to clinic today by [name], MD for evaluation of a left leg swelling.  Patient states that she has had swelling in the left leg for many years.  She sustained a direct force injury to this part of the leg when she was a teenager, and has essentially had a growth in this area.  It has remained relatively constant.  Due to other health issues, she recently lost a lot of weight, and the growth is now more prominent.  Her health remained stable.  She is on dialysis, with an unknown cause.  No overlying changes in the appearance of her skin.  No skin discoloration.  The mass is not tender according to the patient.  She is able to move the mass without discomfort.   Review of Systems: No fevers or chills No numbness or tingling No chest pain No shortness of breath No bowel or bladder dysfunction No GI distress No headaches   Medical History:  Past Medical History:   Diagnosis Date   Abnormal bilirubin test    Cholelithiasis    Chronic kidney disease, stage 3b (HCC)    Diastolic congestive heart failure (HCC)    Esophagitis    Essential hypertension    Fibroids    Gastritis    GERD (gastroesophageal reflux disease)    Gout    Liver hemangioma    Morbid obesity ([place])    Normocytic anemia    OSA (obstructive sleep apnea)    Thyroid nodule    Tubular adenoma     Past Surgical History:  Procedure Laterality Date   AV FISTULA PLACEMENT Left 08/19/2021   Procedure: LEFT ARM ARTERIOVENOUS (AV) FISTULA;  Surgeon: Serafina Mitchell, MD;  Location: [place];  Service: Vascular;  Laterality: Left;   BIOPSY  08/03/2020   Procedure: BIOPSY;  Surgeon: Eloise Harman, DO;  [facility];  Service: Endoscopy;;  duodenal gastric   CESAREAN SECTION     COLONOSCOPY WITH PROPOFOL N/A 08/03/2020    Surgeon: Hurshel Keys K, DO; 5 mm tubular adenoma in the sigmoid colon, nonbleeding internal hemorrhoids.  Recommended repeat in 5 years.   ESOPHAGOGASTRODUODENOSCOPY (EGD) WITH PROPOFOL N/A 08/03/2020   Surgeon: Eloise Harman, DO; LA grade C esophagitis without bleeding, gastritis with erythema and shallow ulcerations in gastric antrum biopsied (negative for H. pylori), normal examined  duodenum s/p biopsy (benign).   HYSTERECTOMY ABDOMINAL WITH SALPINGECTOMY  2008   IR FLUORO GUIDE CV LINE RIGHT  08/15/2021   IR US GUIDE VASC ACCESS RIGHT  08/15/2021   POLYPECTOMY  08/03/2020   Procedure: POLYPECTOMY;  Surgeon: Eloise Harman, DO;  [facility];  Service: Endoscopy;;  colon   RIGHT HEART CATH N/A 07/26/2021   Procedure: RIGHT HEART CATH;  Surgeon: Larey Dresser, MD;  Location: [place] [facility];  Service: Cardiovascular;  Laterality: N/A;   THYROIDECTOMY Right 10/21/2018   Procedure: RIGHT HEMITHYROIDECTOMY;  Surgeon: Leta Baptist, MD;  Location: [place];  Service: ENT;  Laterality: Right;    Family History   Problem Relation Age of Onset   Breast cancer Mother    Other Paternal Grandfather        house fire   Other Maternal Grandmother        house fire   Congestive Heart Failure Maternal Grandfather    Colon cancer Father 38   Diverticulitis Brother    Diverticulitis Sister    Colon polyps Sister 62   Diabetes Daughter        borderline   Social History   Tobacco Use   Smoking status: Former    Packs/day: 0.50    Years: 20.00    Pack years: 10.00    Types: Cigarettes    Quit date: 2017    Years since quitting: 6.0   Smokeless tobacco: Never  Vaping Use   Vaping Use: Never used  Substance Use Topics   Alcohol use: Not Currently   Drug use: Never    Allergies  Allergen Reactions   Oxycodone-Acetaminophen Hives and Other (See Comments)    Current Meds  Medication Sig   acetaminophen (TYLENOL) 500 MG tablet Take 1,000 mg by mouth every 6 (six) hours as needed for moderate pain or headache.   allopurinol (ZYLOPRIM) 100 MG tablet Take 1 tablet (100 mg total) by mouth 3 (three) times a week.   midodrine (PROAMATINE) 5 MG tablet Take 3 tablets (15 mg total) by mouth 3 (three) times daily with meals.   pantoprazole (PROTONIX) 40 MG tablet Take 1 tablet (40 mg total) by mouth daily.   TART CHERRY PO Take by mouth daily.    Objective: BP 111/86    Pulse 91    Ht 5\' 3"  (1.6 m)    Wt 208 lb 12.8 oz (94.7 kg)    BMI 36.99 kg/m   Physical Exam:  General: Alert and oriented. and No acute distress. Gait: Normal gait.  Evaluation of left lower leg demonstrates a large growth projecting from the anterior lateral aspect of the proximal tibia.  No overlying skin changes.  The mass is mobile.  It is compressible.  No tenderness to palpation.  No overlying ulcerations.  Sensation is intact distally.     IMAGING: I personally ordered and reviewed the following images  AP and lateral views of the left tibia were obtained in clinic today.  There is a localized mass in the proximal  extent of the tibia, as demonstrated by soft tissue shadowing.  On the lateral, it appears as though the mass is encapsulated.  No calcifications within the mass.  No chondral matrix.  No involvement of the underlying bone.  Impression: normal left tibia x-ray, with overlying soft tissue mass   New Medications:  No orders of the defined types were placed in this encounter.     Mordecai Rasmussen,  MD  09/20/2021 10:57 PM

## 2021-09-20 NOTE — Patient Instructions (Signed)
While we are working on your approval for MRI please go ahead and call to schedule your appointment with [facility] within at least one (1) week.   Central Scheduling [phone]  

## 2021-10-06 ENCOUNTER — Ambulatory Visit (HOSPITAL_COMMUNITY): Admission: RE | Admit: 2021-10-06 | Payer: Medicaid Other | Source: Ambulatory Visit

## 2021-10-12 ENCOUNTER — Encounter: Payer: Self-pay | Admitting: Internal Medicine

## 2021-10-12 ENCOUNTER — Other Ambulatory Visit: Payer: Self-pay

## 2021-10-12 ENCOUNTER — Ambulatory Visit: Payer: Managed Care, Other (non HMO) | Admitting: Internal Medicine

## 2021-10-12 ENCOUNTER — Ambulatory Visit (INDEPENDENT_AMBULATORY_CARE_PROVIDER_SITE_OTHER): Payer: Managed Care, Other (non HMO) | Admitting: Internal Medicine

## 2021-10-12 VITALS — BP 98/69 | HR 80 | Temp 97.5°F | Ht 63.0 in | Wt 210.4 lb

## 2021-10-12 DIAGNOSIS — D509 Iron deficiency anemia, unspecified: Secondary | ICD-10-CM | POA: Diagnosis not present

## 2021-10-12 DIAGNOSIS — R490 Dysphonia: Secondary | ICD-10-CM

## 2021-10-12 DIAGNOSIS — K219 Gastro-esophageal reflux disease without esophagitis: Secondary | ICD-10-CM | POA: Diagnosis not present

## 2021-10-12 DIAGNOSIS — K7460 Unspecified cirrhosis of liver: Secondary | ICD-10-CM

## 2021-10-12 DIAGNOSIS — R188 Other ascites: Secondary | ICD-10-CM

## 2021-10-12 NOTE — Patient Instructions (Signed)
Etiology of cirrhosis likely due to your heart issues.  That being said I am going to order a bunch of blood work today to rule out other potential causes.  You can have these labs drawn at the hospital.  We will plan on repeat ultrasound in April.  Repeat upper endoscopy in 1 year.  I will place a referral today to see ENT in regards to your chronic hoarseness.  Follow-up with GI in 2 months.  It was nice seeing you again today.  Dr. Abbey Chatters  At [facility] we value your feedback. You may receive a survey about your visit today. Please share your experience as we strive to create trusting relationships with our patients to provide genuine, compassionate, quality care.  We appreciate your understanding and patience as we review any laboratory studies, imaging, and other diagnostic tests that are ordered as we care for you. Our office policy is 5 business days for review of these results, and any emergent or urgent results are addressed in a timely manner for your best interest. If you do not hear from our office in 1 week, please contact us.   We also encourage the use of MyChart, which contains your medical information for your review as well. If you are not enrolled in this feature, an access code is on this after visit summary for your convenience. Thank you for allowing [place] to be involved in your care.  It was great to see you today!  I hope you have a great rest of your Winter!    Lauren Osborn. Abbey Chatters, D.O. Gastroenterology and Hepatology [facility]

## 2021-10-12 NOTE — Addendum Note (Signed)
Addended by: Cheron Every on: 10/12/2021 03:51 PM   Modules accepted: Orders

## 2021-10-12 NOTE — Progress Notes (Signed)
Referring Provider: Carrolyn Meiers* Primary Care Physician:  Carrolyn Meiers, MD Primary GI:  Dr. Abbey Chatters  Chief Complaint  Patient presents with   Cirrhosis    HPI:   Lauren Osborn is a 46 y.o. female who presents to the clinic today for follow-up visit.  She has a complicated past medical history.  History of chronic diastolic congestive heart failure with RV dysfunction recently admitted to [facility] 07/27/2019 to 08/20/21 for significant volume overload, unresponsive to diuretics eventually requiring dialysis, lost over 100 pounds of fluid weight, dialysis complicated by hypotension, started on midodrine.  She had an ultrasound done 07/22/2021 which showed cholelithiasis with mild gallbladder wall thickening as well as hepatic cirrhosis and moderate volume ascites.  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  In regards to her cirrhosis, viral hepatitis testing unremarkable.  Does have significant fatty liver disease history.  No heavy alcohol use.  No family history of liver disease that she is aware of.  Today she states she is doing well.  Denies any dyspnea or chest pain.  Continues to have lower extremity edema.  States she is getting an extra day of dialysis this week for her hypervolemia.  Notes chronic hoarseness for nearly 2 years and is requesting ENT referral.  History of normocytic anemia.  Underwent GI evaluation previously for this: Colonoscopy: 5 mm polyp in the sigmoid colon, nonbleeding internal hemorrhoids, fair prep.  Recommended repeat colonoscopy in 5 years.  Pathology with tubular adenoma. EGD: LA grade C esophagitis without bleeding, gastritis characterized by erythema and shallow ulcerations in gastric antrum biopsied, normal examined duodenum biopsied.  Gastric biopsy negative for H. pylori, duodenal biopsy benign.  Recommended PPI twice daily.  Consider capsule study if  anemia does not improve with iron supplementation and treatment of gastritis/ulcers.   Past Medical History:  Diagnosis Date   Abnormal bilirubin test    Cholelithiasis    Chronic kidney disease, stage 3b (HCC)    Diastolic congestive heart failure (HCC)    Esophagitis    Essential hypertension    Fibroids    Gastritis    GERD (gastroesophageal reflux disease)    Gout    Liver hemangioma    Morbid obesity ([place])    Normocytic anemia    OSA (obstructive sleep apnea)    Thyroid nodule    Tubular adenoma     Past Surgical History:  Procedure Laterality Date   AV FISTULA PLACEMENT Left 08/19/2021   Procedure: LEFT ARM ARTERIOVENOUS (AV) FISTULA;  Surgeon: Serafina Mitchell, MD;  Location: [place];  Service: Vascular;  Laterality: Left;   BIOPSY  08/03/2020   Procedure: BIOPSY;  Surgeon: Eloise Harman, DO;  [facility];  Service: Endoscopy;;  duodenal gastric   CESAREAN SECTION     COLONOSCOPY WITH PROPOFOL N/A 08/03/2020    Surgeon: Hurshel Keys K, DO; 5 mm tubular adenoma in the sigmoid colon, nonbleeding internal hemorrhoids.  Recommended repeat in 5 years.   ESOPHAGOGASTRODUODENOSCOPY (EGD) WITH PROPOFOL N/A 08/03/2020   Surgeon: Eloise Harman, DO; LA grade C esophagitis without bleeding, gastritis with erythema and shallow ulcerations in gastric antrum biopsied (negative for H. pylori), normal examined duodenum s/p biopsy (benign).   HYSTERECTOMY ABDOMINAL WITH SALPINGECTOMY  2008   IR FLUORO GUIDE CV LINE RIGHT  08/15/2021   IR US GUIDE VASC ACCESS RIGHT  08/15/2021   POLYPECTOMY  08/03/2020   Procedure:  POLYPECTOMY;  Surgeon: Eloise Harman, DO;  [facility];  Service: Endoscopy;;  colon   RIGHT HEART CATH N/A 07/26/2021   Procedure: RIGHT HEART CATH;  Surgeon: Larey Dresser, MD;  Location: [place] [facility];  Service: Cardiovascular;  Laterality: N/A;   THYROIDECTOMY Right 10/21/2018   Procedure: RIGHT HEMITHYROIDECTOMY;  Surgeon:  Leta Baptist, MD;  Location: [place];  Service: ENT;  Laterality: Right;    Current Outpatient Medications  Medication Sig Dispense Refill   acetaminophen (TYLENOL) 500 MG tablet Take 1,000 mg by mouth every 6 (six) hours as needed for moderate pain or headache.     allopurinol (ZYLOPRIM) 100 MG tablet Take 1 tablet (100 mg total) by mouth 3 (three) times a week. 36 tablet 3   midodrine (PROAMATINE) 5 MG tablet Take 3 tablets (15 mg total) by mouth 3 (three) times daily with meals. 270 tablet 2   pantoprazole (PROTONIX) 40 MG tablet Take 1 tablet (40 mg total) by mouth daily. 90 tablet 1   TART CHERRY PO Take by mouth daily.     No current facility-administered medications for this visit.    Allergies as of 10/12/2021 - Review Complete 10/12/2021  Allergen Reaction Noted   Oxycodone-acetaminophen Hives and Other (See Comments) 10/11/2018    Family History  Problem Relation Age of Onset   Breast cancer Mother    Other Paternal Grandfather        house fire   Other Maternal Grandmother        house fire   Congestive Heart Failure Maternal Grandfather    Colon cancer Father 26   Diverticulitis Brother    Diverticulitis Sister    Colon polyps Sister 33   Diabetes Daughter        borderline    Social History   Socioeconomic History   Marital status: Single    Spouse name: Not on file   Number of children: Not on file   Years of education: Not on file   Highest education level: Not on file  Occupational History   Not on file  Tobacco Use   Smoking status: Former    Packs/day: 0.50    Years: 20.00    Pack years: 10.00    Types: Cigarettes    Quit date: 2017    Years since quitting: 6.1   Smokeless tobacco: Never  Vaping Use   Vaping Use: Never used  Substance and Sexual Activity   Alcohol use: Not Currently   Drug use: Never   Sexual activity: Yes    Comment: hyst  Other Topics Concern   Not on file  Social History Narrative   Not on file    Social Determinants of Health   Financial Resource Strain: High Risk   Difficulty of Paying Living Expenses: Hard  Food Insecurity: No Food Insecurity   Worried About Running Out of Food in the Last Year: Never true   [place] in the Last Year: Never true  Transportation Needs: No Transportation Needs   Lack of Transportation (Medical): No   Lack of Transportation (Non-Medical): No  Physical Activity: Not on file  Stress: Not on file  Social Connections: Not on file    Subjective: Review of Systems  Constitutional:  Negative for chills and fever.  HENT:  Negative for congestion and hearing loss.        Hoarseness  Eyes:  Negative for blurred vision and double vision.  Respiratory:  Negative  for cough and shortness of breath.   Cardiovascular:  Positive for leg swelling. Negative for chest pain and palpitations.  Gastrointestinal:  Negative for abdominal pain, blood in stool, constipation, diarrhea, heartburn, melena and vomiting.  Genitourinary:  Negative for dysuria and urgency.  Musculoskeletal:  Negative for joint pain and myalgias.  Skin:  Negative for itching and rash.  Neurological:  Negative for dizziness and headaches.  Psychiatric/Behavioral:  Negative for depression. The patient is not nervous/anxious.     Objective: BP 98/69    Pulse 80    Temp (!) 97.5 F (36.4 C) (Temporal)    Ht 5\' 3"  (1.6 m)    Wt 210 lb 6.4 oz (95.4 kg)    BMI 37.27 kg/m  Physical Exam Constitutional:      Appearance: Normal appearance.  HENT:     Head: Normocephalic and atraumatic.  Eyes:     Extraocular Movements: Extraocular movements intact.     Conjunctiva/sclera: Conjunctivae normal.  Cardiovascular:     Rate and Rhythm: Normal rate and regular rhythm.  Pulmonary:     Effort: Pulmonary effort is normal.     Breath sounds: Normal breath sounds.  Abdominal:     General: Bowel sounds are normal.     Palpations: Abdomen is soft.  Musculoskeletal:        General: No  swelling. Normal range of motion.     Cervical back: Normal range of motion and neck supple.     Right lower leg: Edema present.     Left lower leg: Edema present.  Skin:    General: Skin is warm and dry.     Coloration: Skin is not jaundiced.  Neurological:     General: No focal deficit present.     Mental Status: She is alert and oriented to person, place, and time.  Psychiatric:        Mood and Affect: Mood normal.        Behavior: Behavior normal.     Assessment: *Cirrhosis-new diagnosis *Hypervolemia-multifactorial with CHF, ESRD, cirrhosis *Chronic GERD-well-controlled on daily pantoprazole *Normocytic anemia-stable  Plan: Discussed cirrhosis in depth with patient today.  Likely a consequence of her cardiac dysfunction (cardiac cirrhosis) vs NAFLD.  However, we will perform full serological work-up to rule out other causes.  We will check MELD labs today as well.  EGD 08/03/2020 without evidence of esophageal varices.  Repeat 2024.  We will plan on ultrasound in April for [facility] screening.  AFP checked today.  No history of hepatic encephalopathy.  Continue to monitor.  We will defer fluid management to her nephrologist.  Patient currently on dialysis.  Patient states she is starting the process to get worked up for kidney transplant.  Could be considered for liver kidney, though unsure if her cardiac dysfunction would preclude this.  Continue on pantoprazole for chronic GERD which is well controlled.  Follow-up with GI in 2 to 3 months.  10/12/2021 2:59 PM   Disclaimer: This note was dictated with voice recognition software. Similar sounding words can inadvertently be transcribed and may not be corrected upon review.

## 2021-10-13 ENCOUNTER — Telehealth: Payer: Self-pay

## 2021-10-13 ENCOUNTER — Ambulatory Visit (HOSPITAL_COMMUNITY)
Admission: RE | Admit: 2021-10-13 | Discharge: 2021-10-13 | Disposition: A | Discharge status: Home / Self Care | Payer: Medicaid Other | Source: Ambulatory Visit | Attending: Orthopedic Surgery | Admitting: Orthopedic Surgery

## 2021-10-13 DIAGNOSIS — R2242 Localized swelling, mass and lump, left lower limb: Secondary | ICD-10-CM | POA: Diagnosis present

## 2021-10-13 IMAGING — MR MR [PERSON_NAME] LOW W/O CM*L*
5 series · 40 of 40 positions shown · non-contrast
Comparison: X-ray [DATE], ultrasound [DATE]

CLINICAL DATA: Painless soft tissue mass of the left lower
extremity present for several years. Patient describes trauma to
this area as a teenager.

EXAM:
MRI OF LOWER LEFT EXTREMITY WITHOUT CONTRAST
TECHNIQUE: Multiplanar, multisequence MR imaging of the left lower leg was
performed. No intravenous contrast was administered.

[Series 7: composed cor t1_comp_filt · coronal · left · 5.0mm · 1.08mm/px · 4 of 33 slices shown]
[im 1/33]
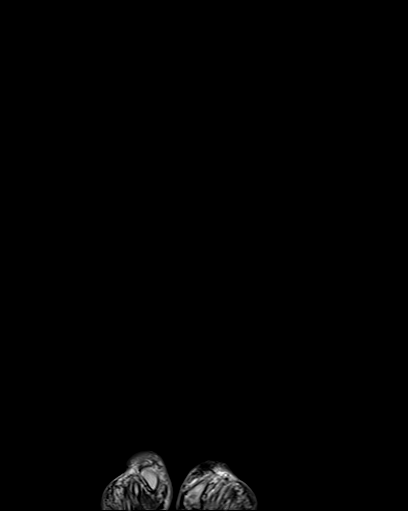
[im 11/33]
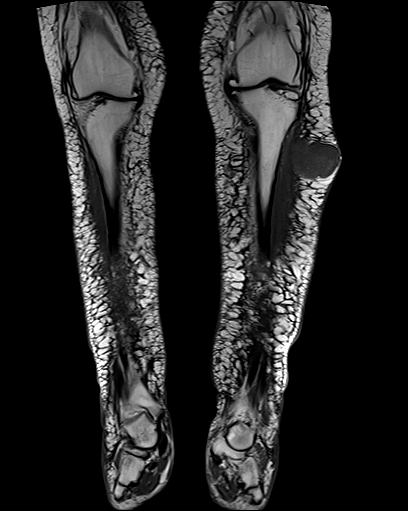
[im 22/33]
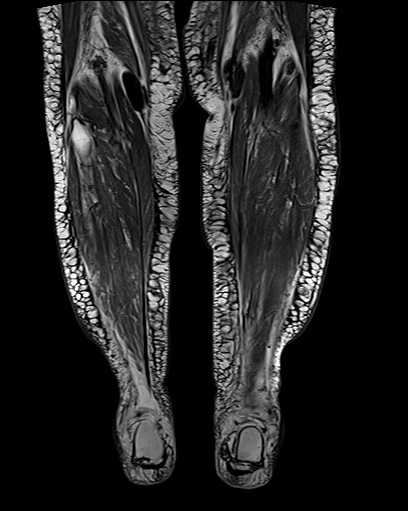
[im 33/33]
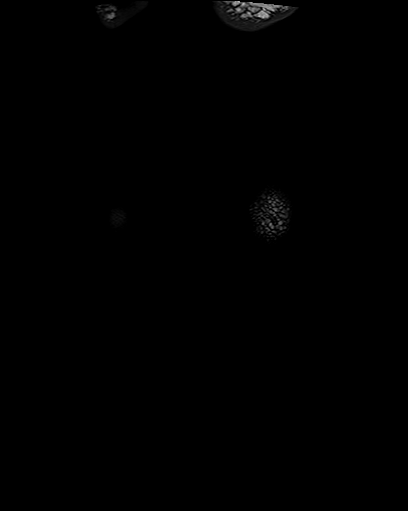

[Series 11: composed cor stir_comp_filt · coronal · left · 5.0mm · 1.08mm/px · 5 of 31 slices shown]
[im 1/31]
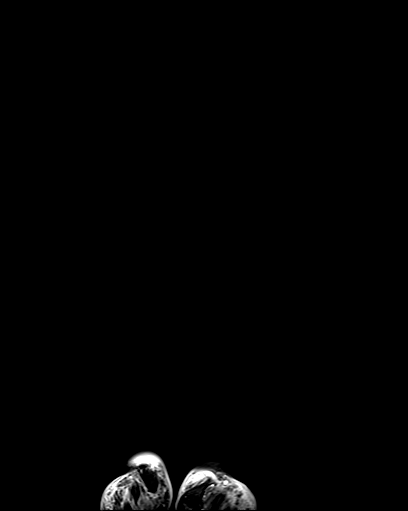
[im 8/31]
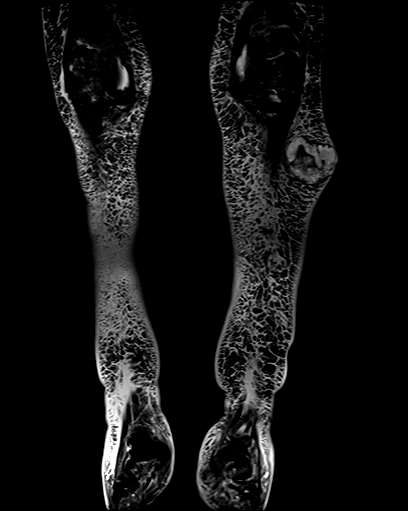
[im 16/31]
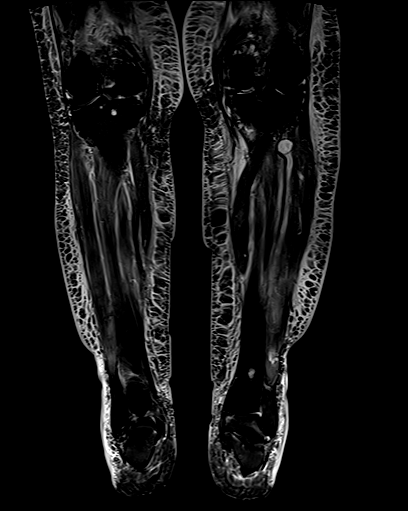
[im 23/31]
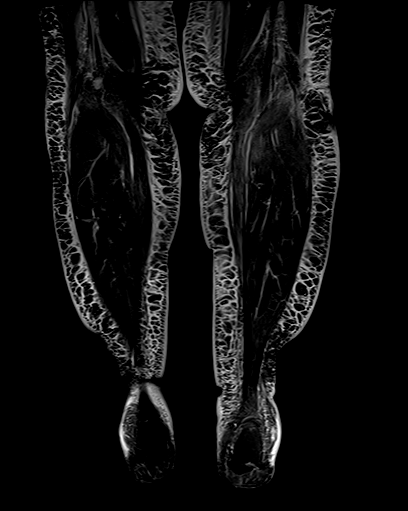
[im 31/31]
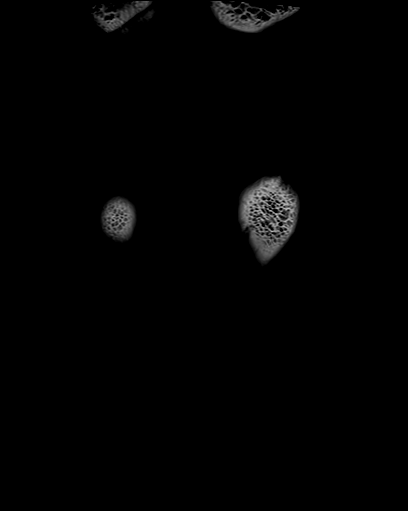

[Series 14: ax t1_comp · axial · left · 5.0mm · 0.86mm/px · z∈[-294,+155]mm · 13 of 82 slices shown]
[im 1/82]
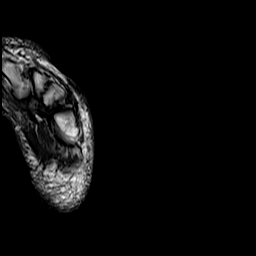
[im 7/82]
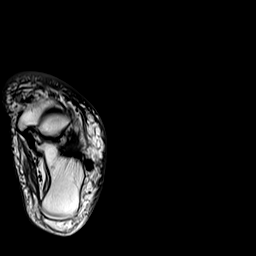
[im 14/82]
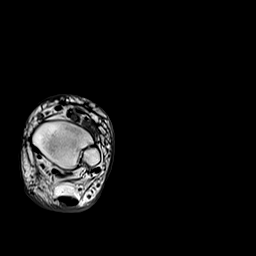
[im 21/82]
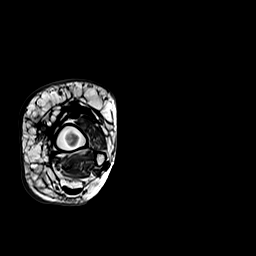
[im 28/82]
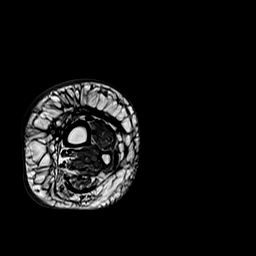
[im 34/82]
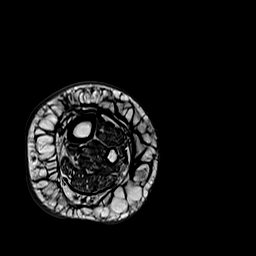
[im 41/82]
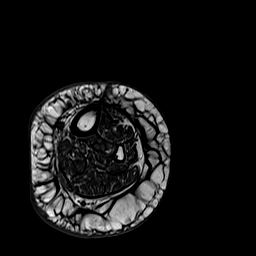
[im 48/82]
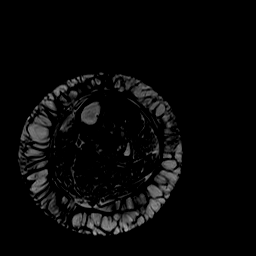
[im 55/82]
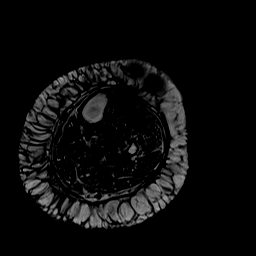
[im 61/82]
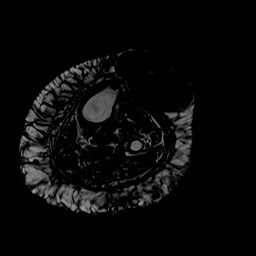
[im 68/82]
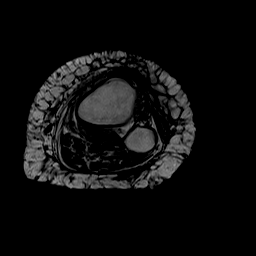
[im 75/82]
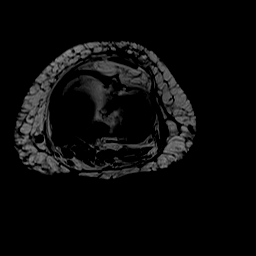
[im 82/82]
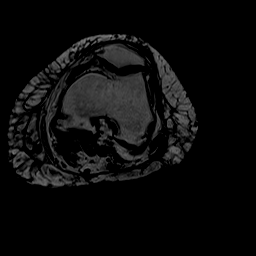

[Series 17: T2 · axial · left · 5.0mm · 0.86mm/px · z∈[-294,+155]mm · 13 of 82 slices shown]
[im 1/82]
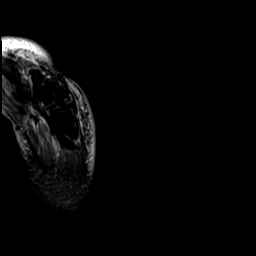
[im 7/82]
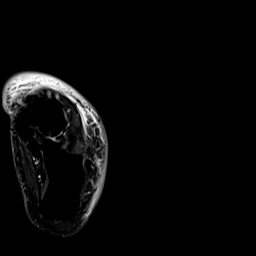
[im 14/82]
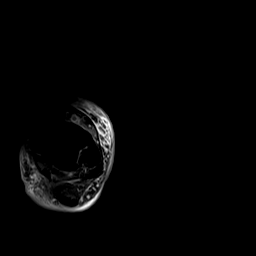
[im 21/82]
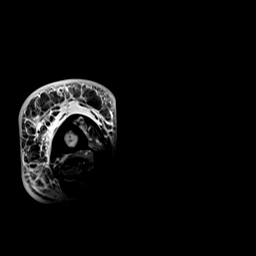
[im 28/82]
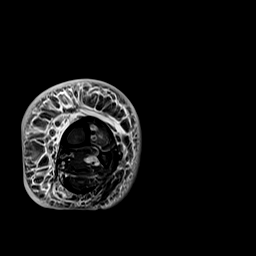
[im 34/82]
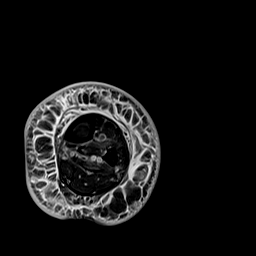
[im 41/82]
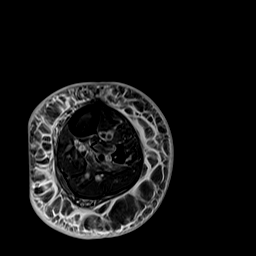
[im 48/82]
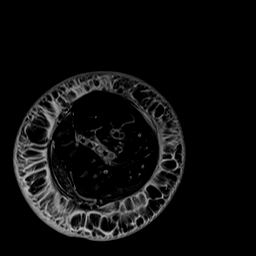
[im 55/82]
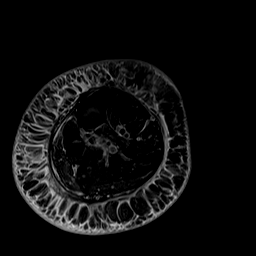
[im 61/82]
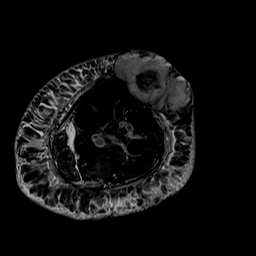
[im 68/82]
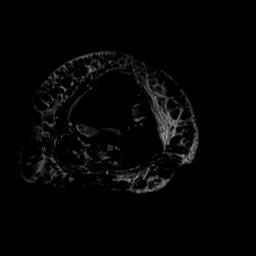
[im 75/82]
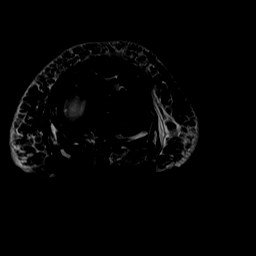
[im 82/82]
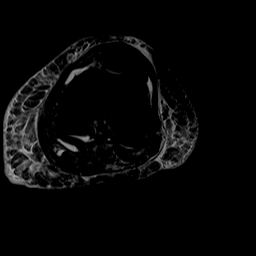

[Series 20: t2_tse_sag fs_comp · sagittal · left · 5.0mm · 0.94mm/px · 5 of 34 slices shown]
[im 1/34]
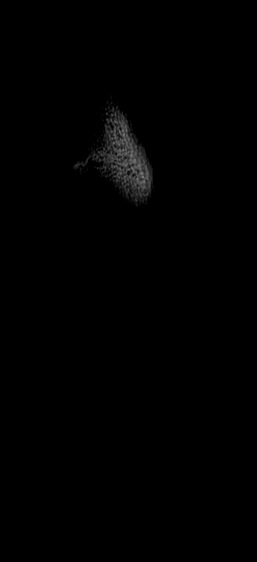
[im 9/34]
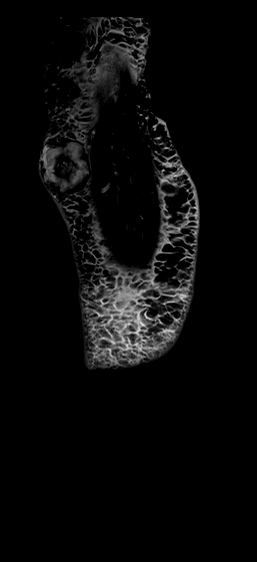
[im 17/34]
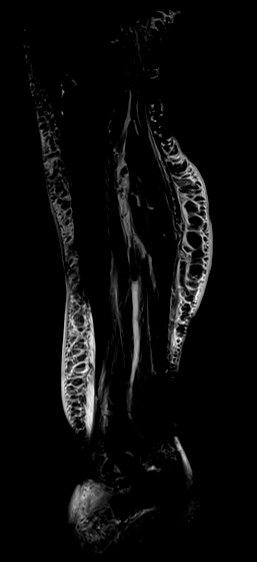
[im 25/34]
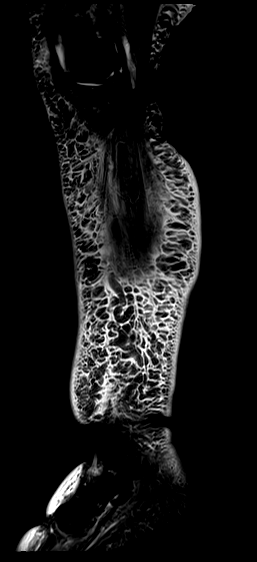
[im 34/34]
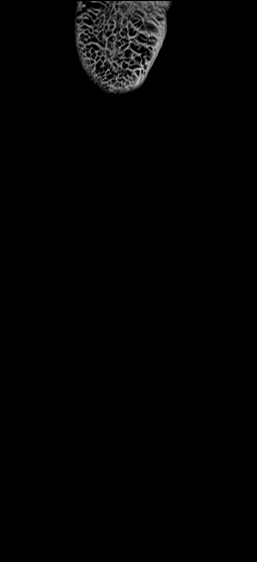

[40 of 40 positions shown; findings below may reference images not displayed]

FINDINGS: Bones/Joint/Cartilage

No acute fracture. No dislocation. Well-circumscribed 2.9 x 1.6 cm
T2 hyperintense lesion within the distal left tibial metaphysis
(series 11, image 20), most likely representing a enchondroma. No
suspicious marrow replacing bone lesion. Age advanced
tricompartmental osteoarthritis of the left knee. No bone marrow
edema.

Ligaments

No acute ligamentous abnormality is identified.

Muscles and Tendons

No acute musculotendinous findings.

Soft tissues

Large well-circumscribed soft tissue mass located within the
subcutaneous soft tissues at the anterolateral aspect of the lower
leg at the level of the proximal tibial metadiaphysis. Mass measures
7.3 x 4.1 x 5.2 cm. Mass is heterogeneously hyperintense on T2 with
internal septations and complexity. Mass is predominantly isointense
to skeletal muscle on T1 with areas of slightly more intermediate
signal. No intralesional fat is evident. Mass abuts but does not
invade the superficial investing fascia of the anterior compartment
musculature. Unfortunately, enhancement is unable to be assessed as
the patient was unable to receive IV contrast.

No additional masses are identified. There is extensive subcutaneous
edema throughout both lower extremities. Small Baker's cyst.
Otherwise, no organized fluid collection.
IMPRESSION: 1. Large well-circumscribed solid-appearing soft tissue mass within
the subcutaneous soft tissues at the anterolateral aspect of the
lower leg at the level of the proximal tibial metadiaphysis. Mass
measures 7.3 x 4.1 x 5.2 cm. Differential considerations include
both benign and malignant soft tissue masses including soft tissue
chondroma, hemangioma, versus a low-grade or indolent soft tissue
sarcoma. Orthopedic oncologic referral and direct tissue sampling
recommended.
2. No acute osseous abnormality.
3. Age advanced tricompartmental osteoarthritis of the left knee.

## 2021-10-13 NOTE — Telephone Encounter (Signed)
° °  Telephone encounter was:  Unsuccessful.  10/13/2021 Name: Lauren Osborn MRN: [id number] DOB: [birth year]  Unsuccessful outbound call made today to assist with:  Financial Difficulties related to bills and dental assistance  Outreach Attempt:  1st Attempt  A HIPAA compliant voice message was left requesting a return call.  Instructed patient to call back at [phone] at their earliest convenience.  [facility] management  [place], [place]  Main Phone: [phone]   E-mail: [email or web address]  Website: [email or web address]

## 2021-10-16 ENCOUNTER — Other Ambulatory Visit: Payer: Self-pay | Admitting: Student

## 2021-10-17 ENCOUNTER — Telehealth: Payer: Self-pay

## 2021-10-17 ENCOUNTER — Other Ambulatory Visit: Payer: Self-pay | Admitting: *Deleted

## 2021-10-17 ENCOUNTER — Other Ambulatory Visit: Payer: Self-pay

## 2021-10-17 NOTE — Patient Instructions (Signed)
Visit Information  Ms. Trusty was given information about Medicaid Managed Care team care coordination services as a part of their Healthy [facility] Medicaid benefit. JOSALYNN JOHNDROW verbally consented to engagement with the [facility] Managed Care team.   If you are experiencing a medical emergency, please call 911 or report to your local emergency department or urgent care.   If you have a non-emergency medical problem during routine business hours, please contact your provider's office and ask to speak with a nurse.   For questions related to your Healthy [facility] health plan, please call: [phone] or visit the homepage here: [email or web address]  If you would like to schedule transportation through your Healthy [facility] plan, please call the following number at least 2 days in advance of your appointment: [phone]  Call the [place] at [phone], at any time, 24 hours a day, 7 days a week. If you are in danger or need immediate medical attention call 911.  If you would like help to quit smoking, call 1-800-QUIT-NOW (934)291-5320) OR Espaol: 1-855-Djelo-Ya (3-154-008-6761) o para ms informacin haga clic aqu or Text READY to 200-400 to register via text  Ms. Winger,   Please see education materials related to HF, CKD and GERD provided as print materials.   The patient verbalized understanding of instructions, educational materials, and care plan provided today and agreed to receive a mailed copy of patient instructions, educational materials, and care plan.   Telephone follow up appointment with Managed Medicaid care management team member scheduled for:12/20/21 @ 10:30am  Lurena Joiner RN, BSN [place] RN Care Coordinator   Following is a copy of your plan of care:  Care Plan : RN Care Manager Plan of Care  Updates made by [name], RN since 10/17/2021 12:00 AM      Problem: Health Management Needs Related to CHF and CKD      Long-Range Goal: Development of Plan of Care to Address Health Management Needs Related to CHF and CKD   Start Date: 08/23/2021  Expected End Date: 11/21/2021  Priority: High  Note:   Current Barriers:  Chronic Disease Management support and education needs related to 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 Auriemma missed the call from [place] and would like a return call to discuss local dental offices accepting Medicaid and financial resources. She has a ENT referral for congestion and changes in her voice and waiting on scheduling. She is aware of all upcoming appointments.  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   Interventions: Inter-disciplinary care team collaboration (see longitudinal plan of care) Evaluation of current treatment plan related to  self management and patient's adherence to plan as established by provider  Heart Failure Interventions:  (Status: Goal on Track (progressing): YES.)  Long Term Goal  Provided education on low sodium diet Discussed the  importance of keeping all appointments with provider Assessed social determinant of health barriers Collaborated with Care Guide for dental and financial resources, requesting another call to patient  Chronic Kidney Disease  (Status: Goal on Track (progressing): YES.)  Long Term Goal  Last practice recorded BP readings:  BP Readings from Last 3 Encounters:  10/12/21 98/69  09/20/21 111/86  09/19/21 (!) 86/60  Most recent eGFR/CrCl: No results found for: EGFR  No components found for: CRCL  Evaluation of current treatment plan related to chronic kidney disease self management and patient's adherence to plan as established by provider      Provided education to patient re: stroke prevention, s/s of heart attack and stroke    Reviewed prescribed diet patient meets with dietician monthly Reviewed medications with patient and discussed importance of compliance    Reviewed scheduled/upcoming provider appointments including    Discussed plans with patient for ongoing care management follow up and provided patient with direct contact information for care management team    Assessed social determinant of health barriers    Support coping and stress management by recognizing current strategies and assist in developing new strategies such as mindfulness, journaling, relaxation techniques, problem-solving     Patient Goals/Self-Care Activities: Take medications as prescribed   Attend all scheduled provider appointments Call pharmacy for medication refills 3-7 days in advance of running out of medications Perform all self care activities independently  Perform IADL's (shopping, preparing meals, housekeeping, managing finances) independently Call provider office for new concerns or questions  call office if I gain more than 2 pounds in one day or 5 pounds in one week meet with dietitian track weight in diary use salt in moderation watch for swelling in feet, ankles and legs every day begin a heart failure diary eat more whole grains, fruits and vegetables, lean meats and healthy fats

## 2021-10-17 NOTE — Telephone Encounter (Signed)
° °  Telephone encounter was:  Successful.  10/17/2021 Name: Lauren Osborn MRN: [id number] DOB: 14-Oct-1975  Lauren Osborn is a 46 y.o. year old female who is a primary care patient of Fanta, Normajean Baxter, MD . The community resource team was consulted for assistance with Financial Difficulties related to utility bills and dental search  Care guide performed the following interventions:  Patient advised she would like to receive all communication by e-mail. Dental offices, addresses and phone numbers were sent to patient as requested . Pt also advised she needs assistance with financial needs for bills. Dss information advised for rent and utility assistance, [facility] financial program and [employer] programs advised as well.  Follow Up Plan:  No further follow up planned at this time. The patient has been provided with needed resources.  [facility] management  [place], [place]  Main Phone: [phone]   E-mail: [email or web address]  Website: [email or web address]

## 2021-10-17 NOTE — Patient Outreach (Signed)
Medicaid Managed Care   Nurse Care Manager Note  10/17/2021 Name:  CLORENE NERIO MRN:  [id number] DOB:  [birth year]  Lauren Osborn is an 46 y.o. year old female who is a primary patient of Lauren Osborn, Lauren Baxter, MD.  The [facility] Managed Care Coordination team was consulted for assistance with:    CHF CKD Stage4  Lauren Osborn was given information about Medicaid Managed Care Coordination team services today. Lauren Osborn Patient agreed to services and verbal consent obtained.  Engaged with patient by telephone for follow up visit in response to provider referral for case management and/or care coordination services.   Assessments/Interventions:  Review of past medical history, allergies, medications, health status, including review of consultants reports, laboratory and other test data, was performed as part of comprehensive evaluation and provision of chronic care management services.  SDOH (Social Determinants of Health) assessments and interventions performed: SDOH Interventions    Flowsheet Row Most Recent Value  SDOH Interventions   Financial Strain Interventions Other (Comment)  [Collaborated with Care Guide to reach back out to patient with resources]       Care Plan  Allergies  Allergen Reactions   Oxycodone-Acetaminophen Hives and Other (See Comments)    Medications Reviewed Today     Reviewed by [name], RN (Registered Nurse) on 10/17/21 at [place] List Status: <None>   Medication Order Taking? Sig Documenting Provider Last Dose Status Informant  acetaminophen (TYLENOL) 500 MG tablet [id number] Yes Take 1,000 mg by mouth every 6 (six) hours as needed for moderate pain or headache. [provider] Taking Active Self  allopurinol (ZYLOPRIM) 100 MG tablet [id number] Yes Take 1 tablet (100 mg total) by mouth 3 (three) times a week. Erma Heritage, PA-C Taking Active   midodrine (PROAMATINE) 5 MG tablet [id number] Yes TAKE 3 TABLETS BY MOUTH 3 TIMES  A DAY WITH MEALS Satira Sark, MD Taking Active   pantoprazole (PROTONIX) 40 MG tablet [id number] Yes Take 1 tablet (40 mg total) by mouth daily. Erma Heritage, PA-C Taking Active   TART CHERRY PO [id number] Yes Take by mouth daily. [provider] Taking Active Self           Med Note Lauren Osborn, Lauren Osborn A   Thu Sep 15, 2021  9:18 AM) Taking on non dialysis days            Patient Active Problem List   Diagnosis Date Noted   Cardiomegaly 09/19/2021   Essential hypertension 09/19/2021   Iron deficiency anemia 09/19/2021   Abdominal pain 07/23/2021   Vitamin D deficiency 07/23/2021   Hepatic cirrhosis ([place]) 07/23/2021   Hepatic hemangioma 07/23/2021   Right upper quadrant abdominal pain 07/22/2021   Rectal bleeding 07/22/2021   Shortness of breath 07/22/2021   Acute exacerbation of CHF (congestive heart failure) ([place]) 07/22/2021   CKD (chronic kidney disease), stage IV ([place]) 06/23/2021   Liver lesion 12/01/2020   Liver disease 12/01/2020   Elevated bilirubin 12/01/2020   OSA (obstructive sleep apnea) 11/16/2020   Acute on chronic heart failure with preserved ejection fraction (HFpEF) ([place]) 09/22/2020   Morbid obesity with BMI of 50.0-59.9, adult ([place]) 09/22/2020   Heart failure, unspecified ([place]) 09/22/2020   Pulmonary edema 09/16/2020   Hypokalemia 09/16/2020   Prolonged QT interval 09/16/2020   Acute kidney injury ([place]) 09/16/2020   Elevated brain natriuretic peptide (BNP) level 09/16/2020   GERD (gastroesophageal reflux disease) 09/16/2020   Anasarca 09/16/2020   Super obese 09/16/2020  Acute kidney failure, unspecified ([place]) 09/16/2020   Normocytic anemia 04/23/2020   Family history of colon cancer 04/23/2020   Mass of upper outer quadrant of right breast 11/04/2019   S/P partial thyroidectomy 10/21/2018   Visit for routine gyn exam 07/03/2018   Fingertip amputation, initial encounter 10/22/2017    Conditions to be addressed/monitored per PCP order:   CHF and CKD Stage 4  Care Plan : RN Care Manager Plan of Care  Updates made by [name], RN since 10/17/2021 12:00 AM     Problem: Health Management Needs Related to CHF and CKD      Long-Range Goal: Development of Plan of Care to Address Health Management Needs Related to CHF and CKD   Start Date: 08/23/2021  Expected End Date: 11/21/2021  Priority: High  Note:   Current Barriers:  Chronic Disease Management support and education needs related to 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 Kobrin missed the call from [place] and would like a return call to discuss local dental offices accepting Medicaid and financial resources. She has a ENT referral for congestion and changes in her voice and waiting on scheduling. She is aware of all upcoming appointments.  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   Interventions: Inter-disciplinary care team collaboration (see longitudinal plan of care) Evaluation of current treatment plan related to  self management and patient's adherence to plan as established by provider  Heart Failure Interventions:  (Status: Goal on Track (progressing): YES.)  Long Term Goal  Provided education on low sodium diet Discussed the importance of keeping all appointments with provider Assessed social determinant of health  barriers Collaborated with Care Guide for dental and financial resources, requesting another call to patient  Chronic Kidney Disease (Status: Goal on Track (progressing): YES.)  Long Term Goal  Last practice recorded BP readings:  BP Readings from Last 3 Encounters:  10/12/21 98/69  09/20/21 111/86  09/19/21 (!) 86/60  Most recent eGFR/CrCl: No results found for: EGFR  No components found for: CRCL  Evaluation of current treatment plan related to chronic kidney disease self management and patient's adherence to plan as established by provider      Provided education to patient re: stroke prevention, s/s of heart attack and stroke    Reviewed prescribed diet patient meets with dietician monthly Reviewed medications with patient and discussed importance of compliance    Reviewed scheduled/upcoming provider appointments including    Discussed plans with patient for ongoing care management follow up and provided patient with direct contact information for care management team    Assessed social determinant of health barriers    Support coping and stress management by recognizing current strategies and assist in developing new strategies such as mindfulness, journaling, relaxation techniques, problem-solving     Patient Goals/Self-Care Activities: Take medications as prescribed   Attend all scheduled provider appointments  Call pharmacy for medication refills 3-7 days in advance of running out of medications Perform all self care activities independently  Perform IADL's (shopping, preparing meals, housekeeping, managing finances) independently Call provider office for new concerns or questions  call office if I gain more than 2 pounds in one day or 5 pounds in one week meet with dietitian track weight in diary use salt in moderation watch for swelling in feet, ankles and legs every day begin a heart failure diary eat more whole grains, fruits and vegetables, lean meats and healthy  fats       Follow Up:  Patient agrees to Care Plan and Follow-up.  Plan: The Managed Medicaid care management team will reach out to the patient again over the next 60 days.  Date/time of next scheduled RN care management/care coordination outreach:  12/20/21 @ 10:30am  Lurena Joiner RN, BSN [place] RN Care Coordinator

## 2021-10-18 ENCOUNTER — Ambulatory Visit (INDEPENDENT_AMBULATORY_CARE_PROVIDER_SITE_OTHER): Payer: Managed Care, Other (non HMO) | Admitting: Orthopedic Surgery

## 2021-10-18 ENCOUNTER — Encounter: Payer: Self-pay | Admitting: Orthopedic Surgery

## 2021-10-18 ENCOUNTER — Other Ambulatory Visit (HOSPITAL_COMMUNITY)
Admission: RE | Admit: 2021-10-18 | Discharge: 2021-10-18 | Disposition: A | Discharge status: Home / Self Care | Payer: Medicaid Other | Source: Ambulatory Visit | Attending: Internal Medicine | Admitting: Internal Medicine

## 2021-10-18 ENCOUNTER — Other Ambulatory Visit: Payer: Self-pay

## 2021-10-18 DIAGNOSIS — K7460 Unspecified cirrhosis of liver: Secondary | ICD-10-CM | POA: Diagnosis not present

## 2021-10-18 DIAGNOSIS — R2242 Localized swelling, mass and lump, left lower limb: Secondary | ICD-10-CM | POA: Diagnosis not present

## 2021-10-18 LAB — IRON AND TIBC
Iron: 98 ug/dL (ref 28–170)
Saturation Ratios: 31 % (ref 10.4–31.8)
TIBC: 316 ug/dL (ref 250–450)
UIBC: 218 ug/dL

## 2021-10-18 LAB — COMPREHENSIVE METABOLIC PANEL
ALT: 18 U/L (ref 0–44)
AST: 40 U/L (ref 15–41)
Albumin: 3.7 g/dL (ref 3.5–5.0)
Alkaline Phosphatase: 107 U/L (ref 38–126)
Anion gap: 17 — ABNORMAL HIGH (ref 5–15)
BUN: 30 mg/dL — ABNORMAL HIGH (ref 6–20)
CO2: 23 mmol/L (ref 22–32)
Calcium: 9.3 mg/dL (ref 8.9–10.3)
Chloride: 92 mmol/L — ABNORMAL LOW (ref 98–111)
Creatinine, Ser: 5.08 mg/dL — ABNORMAL HIGH (ref 0.44–1.00)
GFR, Estimated: 10 mL/min — ABNORMAL LOW (ref >60)
Glucose, Bld: 95 mg/dL (ref 70–99)
Potassium: 3.2 mmol/L — ABNORMAL LOW (ref 3.5–5.1)
Sodium: 132 mmol/L — ABNORMAL LOW (ref 135–145)
Total Bilirubin: 1.8 mg/dL — ABNORMAL HIGH (ref 0.3–1.2)
Total Protein: 7.1 g/dL (ref 6.5–8.1)

## 2021-10-18 LAB — PROTIME-INR
INR: 1.2 (ref 0.8–1.2)
Prothrombin Time: 15.6 seconds — ABNORMAL HIGH (ref 11.4–15.2)

## 2021-10-18 LAB — FERRITIN: Ferritin: 237 ng/mL (ref 11–307)

## 2021-10-18 NOTE — Progress Notes (Signed)
Orthopaedic Clinic Return  Assessment: Lauren Osborn is a 46 y.o. female with the following: Lower left leg mass, pathology unknown  Plan: We reviewed the MRI results in clinic today.  Based on the report, differential considerations include both benign and malignant soft tissue masses including soft tissue chondroma, hemangioma, versus a low-grade or indolent soft tissue sarcoma.  She is on dialysis, and therefore unable to be given IV contrast.     I have recommended a biopsy, and we will refer her to orthopedic oncology at [facility]    Follow-up: Return if symptoms worsen or fail to improve.   Subjective:  Chief Complaint  Patient presents with   leg mass    LT lower/MRI results    History of Present Illness: Lauren Osborn is a 46 y.o. female who returns to clinic for repeat evaluation of her left lower leg mass.  She has had an MRI.  Her health is remained stable.  The size of the mass, or the appearance of the overlying skin has remained constant.  Review of Systems: No fevers or chills No numbness or tingling No chest pain No shortness of breath No bowel or bladder dysfunction No GI distress No headaches   Objective: There were no vitals taken for this visit.  Physical Exam:  Alert and oriented.  No acute distress.  Large, easily identified mass projecting from the anterior and lateral aspect of the proximal tibia.  The mass is mobile.  It is firm in some areas, but compressible in other spots.  No overlying skin changes.   IMAGING: I personally ordered and reviewed the following images:  Left leg MRI  Impression:  1. Large well-circumscribed solid-appearing soft tissue mass within the subcutaneous soft tissues at the anterolateral aspect of the lower leg at the level of the proximal tibial metadiaphysis. Mass measures 7.3 x 4.1 x 5.2 cm. Differential considerations include both benign and malignant soft tissue masses including soft  tissue chondroma, hemangioma, versus a low-grade or indolent soft tissue sarcoma. Orthopedic oncologic referral and direct tissue sampling recommended. 2. No acute osseous abnormality. 3. 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   Mordecai Rasmussen, MD 10/18/2021 10:10 AM

## 2021-10-18 NOTE — Patient Instructions (Signed)
Referral to [facility] to see an orthopaedic oncology specialist.    If you are having issues scheduling an appointment, please contact the office.

## 2021-10-19 LAB — ALPHA-1-ANTITRYPSIN: A-1 Antitrypsin, Ser: 119 mg/dL (ref 101–187)

## 2021-10-19 LAB — ANTI-SMOOTH MUSCLE ANTIBODY, IGG: F-Actin IgG: 19 Units (ref 0–19)

## 2021-10-19 LAB — ANA: Anti Nuclear Antibody (ANA): NEGATIVE

## 2021-10-19 LAB — AFP TUMOR MARKER: AFP, Serum, Tumor Marker: 2.8 ng/mL (ref 0.0–6.4)

## 2021-10-19 LAB — MITOCHONDRIAL ANTIBODIES: Mitochondrial M2 Ab, IgG: <20 Units (ref 0.0–20.0)

## 2021-10-20 LAB — GLIA (IGA/G) + TTG IGA
Antigliadin Abs, IgA: 4 units (ref 0–19)
Gliadin IgG: 27 units — ABNORMAL HIGH (ref 0–19)
Tissue Transglutaminase Ab, IgA: <2 U/mL (ref 0–3)

## 2021-10-23 LAB — IMMUNOGLOBULINS A/E/G/M, SERUM
IgA: 284 mg/dL (ref 87–352)
IgE (Immunoglobulin E), Serum: 249 IU/mL (ref 6–495)
IgG (Immunoglobin G), Serum: 1237 mg/dL (ref 586–1602)
IgM (Immunoglobulin M), Srm: 309 mg/dL — ABNORMAL HIGH (ref 26–217)

## 2021-10-31 ENCOUNTER — Other Ambulatory Visit: Payer: Self-pay

## 2021-10-31 ENCOUNTER — Ambulatory Visit (HOSPITAL_COMMUNITY)
Admission: RE | Admit: 2021-10-31 | Discharge: 2021-10-31 | Disposition: A | Discharge status: Home / Self Care | Payer: Medicaid Other | Source: Ambulatory Visit | Attending: Surgery | Admitting: Surgery

## 2021-10-31 ENCOUNTER — Ambulatory Visit: Payer: Medicaid Other | Admitting: Physician Assistant

## 2021-10-31 VITALS — BP 91/64 | HR 76 | Temp 98.1°F | Resp 20 | Ht 63.0 in | Wt 208.5 lb

## 2021-10-31 DIAGNOSIS — N186 End stage renal disease: Secondary | ICD-10-CM

## 2021-10-31 DIAGNOSIS — Z992 Dependence on renal dialysis: Secondary | ICD-10-CM

## 2021-10-31 NOTE — Progress Notes (Signed)
Postoperative Access Visit   History of Present Illness   Lauren Osborn is a 46 y.o. year old female who presents for postoperative follow-up for:  left brachiocephalic AV fistula on 36/64/40 by Dr. Trula Slade. She was previously seen on 09/19/21 at which time her fistula was maturing well but was difficult to palpate in the upper arm and some pulsatility was present. It was recommended that she continue to exercise her left arm and return in 6 weeks for re evaluation. She presents today for that follow up. She reports no steal symptoms. She has been trying to exercise her left arm. She is still trying to see if she can be transitioned to PD.   She has been dialyzing  M/W/F at [facility] location in [place].  She has a TDC that was placed by IR on 08/15/2021.   Physical Examination   Vitals:   10/31/21 1346  BP: 91/64  Pulse: 76  Resp: 20  Temp: 98.1 F (36.7 C)  TempSrc: Temporal  SpO2: 99%  Weight: 208 lb 8 oz (94.6 kg)  Height: 5\' 3"  (1.6 m)   Body mass index is 36.93 kg/m.  left arm Incision is well healed, 2+ radial pulse, hand grip is 5/5, sensation in digits is intact, palpable thrill, bruit can  be auscultated. There are visible venous branches present in left upper arm. The fistula becomes deep in the upper arm just past branches. It is hard to palpate the fistula in the proximal upper arm past this point   Non invasive Vascular lab: 10/31/21 Findings:  +--------------------+----------+-----------------+--------+   AVF                  PSV (cm/s) Flow Vol (mL/min) Comments   +--------------------+----------+-----------------+--------+   Native artery inflow    117           1356                   +--------------------+----------+-----------------+--------+   AVF Anastomosis         510                                  +--------------------+----------+-----------------+--------+   +------------+----------+-------------+----------+----------------+   OUTFLOW VEIN PSV  (cm/s) Diameter (cm) Depth (cm)     Describe       +------------+----------+-------------+----------+----------------+   Shoulder         62         0.79         3.15                       +------------+----------+-------------+----------+----------------+   Prox UA          96         0.78         2.67                       +------------+----------+-------------+----------+----------------+   Mid UA          252         0.69         1.53    competing branch   +------------+----------+-------------+----------+----------------+   Dist UA         285         0.90         0.44    competing branch   +------------+----------+-------------+----------+----------------+  AC Fossa        436         0.65         0.26                       +------------+----------+-------------+----------+----------------+   Summary:  Patent left arteriovenous fistula. Arteriovenous fistula-Velocities less than 100cm/s noted.   Medical Decision Making   Lauren Osborn is a 46 y.o. year old female who presents s/p left brachiocephalic AV fistula on 07/62/26 by Dr. Trula Slade. Fistula is patent with good volume flow. Fistula on duplex today has some competing branches which can be visualized on exam. Beyond the branches the fistula becomes deep in the left upper arm. I do not feel there is enough length in the area where the fistula is palpable in distal upper arm for the fistula to be used for cannulation. I have recommended branch ligation and superficialization. I discussed with patient that if she wanted she could allow them to attempt access but with such little area to access I anticipate issues in the future with aneurysm development, etc. I feel that she would have the best opportunity for successful cannulation if she were to have it translocated.  Patient is agreeable to proceed. She is still in the process of being considered for PD.She will proceed with left BC 2nd stage transposition in the meantime. She dialyzes on  MWF so will try to arrange this with Dr. Trula Slade on a non dialysis day in the near future.   [name], PA-C [facility] Office: [phone]  Clinic MD: Dr. Trula Slade

## 2021-11-08 ENCOUNTER — Other Ambulatory Visit: Payer: Self-pay

## 2021-11-08 ENCOUNTER — Ambulatory Visit (INDEPENDENT_AMBULATORY_CARE_PROVIDER_SITE_OTHER): Payer: Medicare Other | Admitting: Pulmonary Disease

## 2021-11-08 ENCOUNTER — Encounter: Payer: Self-pay | Admitting: Pulmonary Disease

## 2021-11-08 DIAGNOSIS — G4733 Obstructive sleep apnea (adult) (pediatric): Secondary | ICD-10-CM | POA: Diagnosis not present

## 2021-11-08 DIAGNOSIS — I5032 Chronic diastolic (congestive) heart failure: Secondary | ICD-10-CM

## 2021-11-08 NOTE — Assessment & Plan Note (Signed)
She has lost significant weight since her original study.  We will proceed with reassessing with a split-night study due to her cardiac issues.  We will have to coordinate this test with her dialysis schedule. ?Unable to obtain a download on her luna machine today but I emphasized CPAP compliance.  I feel she may have some residual disease.  She is back on track and is using at least 4 hours every night ? ?Weight loss encouraged, compliance with goal of at least 4-6 hrs every night is the expectation. ?Advised against medications with sedative side effects ?Cautioned against driving when sleepy - understanding that sleepiness will vary on a day to day basis ? ?

## 2021-11-08 NOTE — Patient Instructions (Signed)
Plan for split night study at [facility] ?

## 2021-11-08 NOTE — Progress Notes (Signed)
? ?  Subjective:  ? ? Patient ID: Lauren Osborn, female    DOB: 1976/05/11, 46 y.o.   MRN: [id number] ? ?HPI ? ?46 yo old morbidly obese woman for follow-up of severe OSA ?PMH -HF pEF ?ESRD ?cirrhosis ?  ?She  obtained a Luna CPAP from [facility] in 2022 ? ?6 month FU visit ?She was hospitalized 40/9811, course complicated by AKI due to diuretics, eventually required hemodialysis, lost 120 pounds during that hospitalization.  She now has ESRD and is now on dialysis via right permacath.  Soft blood pressure requiring midodrine.  Her dry weight is down to 205 pounds, today she is at 215 ? ?She reports that due to her dialysis schedule she has slacked off further CPAP machine but over the past few weeks is back on track using it at least 4 hours every night. ?Her daughter has noted snoring when she is not on the machine ? ?Reviewed evaluation from renal and cardiology/heart failure service-plan is to transition to PD ? ? ?Significant tests/ events reviewed ? ?Admitted 09/2020 for CHF, wt 358 >> 329 lbs. ?  ?12/2020 NPSG >> 294 lbs,  severe OSA, AHI 39/h ? ?Review of Systems ?neg for any significant sore throat, dysphagia, itching, sneezing, nasal congestion or excess/ purulent secretions, fever, chills, sweats, unintended wt loss, pleuritic or exertional cp, hempoptysis, orthopnea pnd or change in chronic leg swelling. Also denies presyncope, palpitations, heartburn, abdominal pain, nausea, vomiting, diarrhea or change in bowel or urinary habits, dysuria,hematuria, rash, arthralgias, visual complaints, headache, numbness weakness or ataxia. ? ?   ?Objective:  ? Physical Exam ? ?Gen. Pleasant, obese, in no distress ?ENT - no lesions, no post nasal drip ?Neck: No JVD, no thyromegaly, no carotid bruits ?Lungs: no use of accessory muscles, no dullness to percussion, decreased without rales or rhonchi  ?Cardiovascular: Rhythm regular, heart sounds  normal, no murmurs or gallops, 2+ peripheral edema ?Musculoskeletal: No  deformities, no cyanosis or clubbing , no tremors ? ? ? ?   ?Assessment & Plan:  ? ? ?

## 2021-11-08 NOTE — Assessment & Plan Note (Signed)
Much improved with dialysis and weight loss. ?Dry weight around 205 pounds ?

## 2021-11-10 ENCOUNTER — Telehealth: Payer: Self-pay | Admitting: Cardiology

## 2021-11-10 NOTE — Telephone Encounter (Signed)
I will FYI B.Strader,PA-C 

## 2021-11-10 NOTE — Telephone Encounter (Signed)
I left message for [facility] that he PA , Magda Kiel, at dialysis needs to speak with nephrology regarding this. ?

## 2021-11-10 NOTE — Telephone Encounter (Signed)
? ? ?  She is not on any cardiac medications which would drop her blood pressure. She has been on Midodrine 15 mg TID which was dosed by Nephrology during admission. I would recommend they review with her Nephrologist to see if they need to consider the addition of Florinef or other options since she is already on a significant amount of Midodrine. ? ?Signed, ?Erma Heritage, PA-C ?11/10/2021, 4:39 PM ? ? ?

## 2021-11-10 NOTE — Telephone Encounter (Signed)
? ?  Pt c/o BP issue: STAT if pt c/o blurred vision, one-sided weakness or slurred speech ? ?1. What are your last 5 BP readings? '82/48 80/53 90/57 '$ ? ?2. Are you having any other symptoms (ex. Dizziness, headache, blurred vision, passed out)? Asymptomatic  ? ?3. What is your BP issue? Dialysis PA Magda Kiel called, pt is there today for dialysis and they are concern about pt's low BP. He said to call pt for recommendations  ?

## 2021-11-11 NOTE — Telephone Encounter (Signed)
I spoke with [facility]. She states Ramiro Harvest, PA-C [facility] [phone]) is not in the office today but she will speak with him to discuss medication options with nephrology. ?

## 2021-11-22 ENCOUNTER — Encounter (HOSPITAL_COMMUNITY): Payer: Self-pay | Admitting: Surgery

## 2021-11-22 ENCOUNTER — Other Ambulatory Visit: Payer: Self-pay

## 2021-11-22 NOTE — Progress Notes (Signed)
PCP - Dr. Legrand Rams ?Cardiologist - Dr. Domenic Polite ?EKG - 09/12/21 ?Chest x-ray -  ?ECHO - 07/23/21 ?Cardiac Cath - 07/26/21 ?CPAP - yes ? ?ERAS Protcol - n/a ?COVID TEST- n/a ? ?Anesthesia review: yes ? ?------------- ? ?SDW INSTRUCTIONS: ? ?Your procedure is scheduled on Thursday 3/23. Please [facility] to [facility] Main Entr[facility] "A" at 0530 A.M., and check in at the Admitting office. Call this number if you have problems the morning of surgery: [phone] ? ? ?Remember: Do not eat or drink after midnight the night before your surgery ? ?Medications to take morning of surgery with a sip of water include: ?allopurinol (ZYLOPRIM) ?midodrine (PROAMATINE) ?pantoprazole (PROTONIX) ?acetaminophen (TYLENOL) if needed ? ?As of today, STOP taking any Aspirin (unless otherwise instructed by your surgeon), Aleve, Naproxen, Ibuprofen, Motrin, Advil, Goody's, BC's, all herbal medications, fish oil, and all vitamins. ? ?  ?The Morning of Surgery ?Do not wear jewelry, make-up or nail polish. ?Do not wear lotions, powders, or perfumes, or deodorant ?Do not bring valuables to the hospital. ?[facility] is not responsible for any belongings or valuables. ? ?If you are a smoker, DO NOT Smoke 24 hours prior to surgery ? ?If you wear a CPAP at night please bring your mask the morning of surgery  ? ?Remember that you must have someone to transport you home after your surgery, and remain with you for 24 hours if you are discharged the same day. ? ?Please bring cases for contacts, glasses, hearing aids, dentures or bridgework because it cannot be worn into surgery.  ? ?Patients discharged the day of surgery will not be allowed to drive home.  ? ?Please shower the NIGHT BEFORE/MORNING OF SURGERY (use antibacterial soap like DIAL soap if possible). Wear comfortable clothes the morning of surgery. Oral Hygiene is also important to reduce your risk of infection.  Remember - BRUSH YOUR TEETH THE MORNING OF SURGERY WITH YOUR REGULAR  TOOTHPASTE ? ?Patient denies shortness of breath, fever, cough and chest pain.  ? ? ?   ? ?

## 2021-11-23 NOTE — Anesthesia Preprocedure Evaluation (Addendum)
Anesthesia Evaluation  ?Patient identified by MRN, date of birth, ID band ?Patient awake ? ? ? ?Reviewed: ?Allergy & Precautions, NPO status , Patient's Chart, lab work & pertinent test results ? ?Airway ?Mallampati: III ? ?TM Distance: >3 FB ?Neck ROM: Full ? ? ? Dental ?no notable dental hx. ?(+) Dental Advisory Given, Partial Upper, Partial Lower ?  ?Pulmonary ?shortness of breath, sleep apnea , former smoker,  ?  ?Pulmonary exam normal ?breath sounds clear to auscultation ? ? ? ? ? ? Cardiovascular ?Exercise Tolerance: Good ?+CHF  ?Normal cardiovascular exam ?Rhythm:Regular Rate:Normal ? ?Hypotension. On midodrine. ?  ?Neuro/Psych ?negative neurological ROS ? negative psych ROS  ? GI/Hepatic ?Neg liver ROS, GERD  Medicated,  ?Endo/Other  ?obesity ? Renal/GU ?ESRF and DialysisRenal disease  ?negative genitourinary ?  ?Musculoskeletal ?negative musculoskeletal ROS ?(+)  ? Abdominal ?  ?Peds ?negative pediatric ROS ?(+)  Hematology ? ?(+) Blood dyscrasia, anemia ,   ?Anesthesia Other Findings ? ? Reproductive/Obstetrics ?negative OB ROS ? ?  ? ? ? ? ? ? ? ? ? ? ? ? ? ?  ?  ? ? ? ? ? ? ?Anesthesia Physical ?Anesthesia Plan ? ?ASA: 4 ? ?Anesthesia Plan: Regional and MAC  ? ?Post-op Pain Management: Regional block* and Tylenol PO (pre-op)*  ? ?Induction: Intravenous ? ?PONV Risk Score and Plan: 2 and Treatment may vary due to age or medical condition ? ?Airway Management Planned: Natural Airway and Simple Face Mask ? ?Additional Equipment: None ? ?Intra-op Plan:  ? ?Post-operative Plan:  ? ?Informed Consent: I have reviewed the patients History and Physical, chart, labs and discussed the procedure including the risks, benefits and alternatives for the proposed anesthesia with the patient or authorized representative who has indicated his/her understanding and acceptance.  ? ? ? ?Dental advisory given ? ?Plan Discussed with: CRNA, Anesthesiologist and Surgeon ? ?Anesthesia Plan  Comments: (Supraclavicular block. Propofol gtt. GA/LMA as backup plan. Norton Blizzard, MD  ? ? ?PAT note written 11/23/2021 by Myra Gianotti, PA-C. ?)  ? ? ? ? ?Anesthesia Quick Evaluation ? ?

## 2021-11-23 NOTE — Progress Notes (Signed)
Anesthesia Chart Review: SAME DAY WORK-UP ? Case: [id number] Date/Time: 11/24/21 0715  ? Procedure: LEFT SECOND STAGE BRACHOCEPHALIC VEIN TRANSPOSITION (Left)  ? Anesthesia type: Choice  ? Pre-op diagnosis: ESRD  ? [facility]: [facility] ROOM 12 / [facility] OR  ? Surgeons: Serafina Mitchell, MD  ? ?  ? ? ?DISCUSSION: Patient is a 46 year old female scheduled for the above procedure. S/p left left brachiocephalic AVF 93/81/01. Republic placed by IR 08/15/21.  ? ?History includes former smoker (quit 09/05/15), HTN, chronic diastolic CHF, ESRD ([facility], MWF), OSA, GERD, thyroid nodule, anemia, liver hemangioma, obesity.  ? ?Last HF cardiologist visit with Dr. Aundra Dubin on 09/12/21 for follow-up chronic diastolic CHF with RV failure. She had been hospitalized for CHF exacerbation with anasarca in December 2022 in setting of worsening CKD.  CVVH started and eventually transition to hemodialysis.  Midodrine started for hypotension with dialysis.  She was only mildly volume overloaded at that time volume being managed by dialysis.  As needed heart failure clinic follow-up recommended with ongoing primary cardiology follow-up with Dr. Domenic Polite in [place] recommended. ? ?Anesthesia team to evaluate on the day of surgery. ? ? ?VS: Ht '5\' 3"'$  (1.6 m)   Wt 96.2 kg   BMI 37.55 kg/m?  ?BP Readings from Last 3 Encounters:  ?11/08/21 132/82  ?10/31/21 91/64  ?10/12/21 98/69  ? ?Pulse Readings from Last 3 Encounters:  ?11/08/21 74  ?10/31/21 76  ?10/12/21 80  ?  ? ?PROVIDERS: ?Carrolyn Meiers, MD is PCP  ?Rozann Lesches, MD is cardiologist ?Loralie Champagne, MD is HF cardiologist ?Kara Mead, MD is pulmonologist ? ? ?LABS: For ISTAT on arrival. Previous results include: ?Lab Results  ?Component Value Date  ? WBC 9.7 08/20/2021  ? HGB 10.0 (L) 08/20/2021  ? HCT 30.3 (L) 08/20/2021  ? PLT 261 08/20/2021  ? GLUCOSE 95 10/18/2021  ? ALT 18 10/18/2021  ? AST 40 10/18/2021  ? NA 132 (L) 10/18/2021  ? K 3.2 (L) 10/18/2021  ? CL 92 (L) 10/18/2021  ? CREATININE  5.08 (H) 10/18/2021  ? BUN 30 (H) 10/18/2021  ? CO2 23 10/18/2021  ? TSH 5.189 (H) 07/23/2021  ? INR 1.2 10/18/2021  ? MICROALBUR 9.2 (H) 06/23/2021  ?  ? ?Sleep Study (NPSG) 12/03/20: ?IMPRESSIONS  ?- Severe obstructive sleep apnea occurred during this study (AHI  ?= 39.3/h).  ?- No significant central sleep apnea occurred during this study  ?(CAI = 1.4/h).  ?- Severe oxygen desaturation was noted during this study (Min O2  ?= 69.00%).  ?- The patient snored with moderate snoring volume.  ?- No cardiac abnormalities were noted during this study.  ?- Clinically significant periodic limb movements did not occur  ?during sleep. No significant associated arousals.   ? ? ?IMAGES: ?1V PCXR 07/29/21: ?FINDINGS: ?Single frontal view of the chest demonstrates an unremarkable ?cardiac silhouette. No airspace disease, effusion, or pneumothorax. ?No acute bony abnormalities.  ?IMPRESSION: ?1. Stable exam, no acute process. ? ? ?EKG: 09/12/21: ?Normal sinus rhythm ?Nonspecific T wave abnormality ?Prolonged QT [QT 452 ms, QTc 494 ms] ?Abnormal ECG ?When compared with ECG of 23-Jul-2021 16:43, ?QT has lengthened ?Confirmed by Kirk Ruths [phone]) on 09/12/2021 3:18:51 PM ? ? ?CV: ?[place] 07/26/21: ?1. Markedly elevated left and right heart filling pressures.  ?2. Moderate pulmonary venous hypertension.  ?3. Preserved cardiac output.  ?- Started on hemodialysis 08/2021. ? ? ?Echo 07/23/21: ?IMPRESSIONS  ? 1. Left ventricular ejection fraction, by estimation, is 60 to 65%. The  ?left  ventricle has normal function. The left ventricle has no regional  ?wall motion abnormalities. Left ventricular diastolic parameters were  ?normal.  ? 2. Right ventricular systolic function is normal. The right ventricular  ?size is normal.  ? 3. Left atrial size was mildly dilated.  ? 4. The mitral valve is normal in structure. No evidence of mitral valve  ?regurgitation. No evidence of mitral stenosis.  ? 5. The aortic valve is tricuspid. Aortic valve  regurgitation is not  ?visualized. Aortic valve sclerosis is present, with no evidence of aortic  ?valve stenosis.  ? ? ?Past Medical History:  ?Diagnosis Date  ? Abnormal bilirubin test   ? Cholelithiasis   ? Chronic kidney disease, stage 3b ([place])   ? Diastolic congestive heart failure ([place])   ? Esophagitis   ? Essential hypertension   ? Fibroids   ? Gastritis   ? GERD (gastroesophageal reflux disease)   ? Gout   ? Liver hemangioma   ? Morbid obesity ([place])   ? Normocytic anemia   ? OSA (obstructive sleep apnea)   ? Thyroid nodule   ? Tubular adenoma   ? ? ?Past Surgical History:  ?Procedure Laterality Date  ? AV FISTULA PLACEMENT Left 08/19/2021  ? Procedure: LEFT ARM ARTERIOVENOUS (AV) FISTULA;  Surgeon: Serafina Mitchell, MD;  Location: [facility];  Service: Vascular;  Laterality: Left;  ? BIOPSY  08/03/2020  ? Procedure: BIOPSY;  Surgeon: Eloise Harman, DO;  [facility];  Service: Endoscopy;;  duodenal ?gastric  ? CESAREAN SECTION    ? COLONOSCOPY WITH PROPOFOL N/A 08/03/2020  ?  Surgeon: Hurshel Keys K, DO; 5 mm tubular adenoma in the sigmoid colon, nonbleeding internal hemorrhoids.  Recommended repeat in 5 years.  ? ESOPHAGOGASTRODUODENOSCOPY (EGD) WITH PROPOFOL N/A 08/03/2020  ? Surgeon: Eloise Harman, DO; LA grade C esophagitis without bleeding, gastritis with erythema and shallow ulcerations in gastric antrum biopsied (negative for H. pylori), normal examined duodenum s/p biopsy (benign).  ? HYSTERECTOMY ABDOMINAL WITH SALPINGECTOMY  2008  ? IR FLUORO GUIDE CV LINE RIGHT  08/15/2021  ? IR US GUIDE VASC ACCESS RIGHT  08/15/2021  ? POLYPECTOMY  08/03/2020  ? Procedure: POLYPECTOMY;  Surgeon: Eloise Harman, DO;  [facility];  Service: Endoscopy;;  colon  ? RIGHT HEART CATH N/A 07/26/2021  ? Procedure: RIGHT HEART CATH;  Surgeon: Larey Dresser, MD;  Location: [facility];  Service: Cardiovascular;  Laterality: N/A;  ? THYROIDECTOMY Right 10/21/2018  ? Procedure: RIGHT  HEMITHYROIDECTOMY;  Surgeon: Leta Baptist, MD;  Location: [facility];  Service: ENT;  Laterality: Right;  ? ? ?MEDICATIONS: ?No current facility-administered medications for this encounter.  ? ? acetaminophen (TYLENOL) 500 MG tablet  ? allopurinol (ZYLOPRIM) 100 MG tablet  ? midodrine (PROAMATINE) 5 MG tablet  ? pantoprazole (PROTONIX) 40 MG tablet  ? TART CHERRY PO  ? ? ? ?Myra Gianotti, PA-C ?Surgical Short Stay/Anesthesiology ?[facility] Phone [phone] ?[facility] Phone [phone] ?11/23/2021 1:13 PM ? ? ? ? ? ? ?

## 2021-11-24 ENCOUNTER — Ambulatory Visit (HOSPITAL_COMMUNITY)
Admission: RE | Admit: 2021-11-24 | Discharge: 2021-11-24 | Disposition: A | Discharge status: Home / Self Care | Payer: Medicare Other | Attending: Surgery | Admitting: Surgery

## 2021-11-24 ENCOUNTER — Encounter (HOSPITAL_COMMUNITY): Payer: Self-pay | Admitting: Surgery

## 2021-11-24 ENCOUNTER — Other Ambulatory Visit: Payer: Self-pay

## 2021-11-24 ENCOUNTER — Ambulatory Visit (HOSPITAL_COMMUNITY): Payer: Medicare Other | Admitting: Vascular Surgery

## 2021-11-24 ENCOUNTER — Ambulatory Visit (HOSPITAL_BASED_OUTPATIENT_CLINIC_OR_DEPARTMENT_OTHER): Payer: Medicare Other | Admitting: Vascular Surgery

## 2021-11-24 ENCOUNTER — Encounter (HOSPITAL_COMMUNITY)
Admission: RE | Disposition: A | Discharge status: Home / Self Care | Payer: Self-pay | Source: Home / Self Care | Attending: Surgery

## 2021-11-24 DIAGNOSIS — T82898A Other specified complication of vascular prosthetic devices, implants and grafts, initial encounter: Secondary | ICD-10-CM

## 2021-11-24 DIAGNOSIS — E669 Obesity, unspecified: Secondary | ICD-10-CM | POA: Diagnosis not present

## 2021-11-24 DIAGNOSIS — D1803 Hemangioma of intra-abdominal structures: Secondary | ICD-10-CM | POA: Insufficient documentation

## 2021-11-24 DIAGNOSIS — D1809 Hemangioma of other sites: Secondary | ICD-10-CM | POA: Diagnosis not present

## 2021-11-24 DIAGNOSIS — D759 Disease of blood and blood-forming organs, unspecified: Secondary | ICD-10-CM | POA: Diagnosis not present

## 2021-11-24 DIAGNOSIS — Z992 Dependence on renal dialysis: Secondary | ICD-10-CM

## 2021-11-24 DIAGNOSIS — I272 Pulmonary hypertension, unspecified: Secondary | ICD-10-CM | POA: Insufficient documentation

## 2021-11-24 DIAGNOSIS — D649 Anemia, unspecified: Secondary | ICD-10-CM | POA: Diagnosis not present

## 2021-11-24 DIAGNOSIS — Z6838 Body mass index (BMI) 38.0-38.9, adult: Secondary | ICD-10-CM | POA: Insufficient documentation

## 2021-11-24 DIAGNOSIS — I509 Heart failure, unspecified: Secondary | ICD-10-CM | POA: Diagnosis not present

## 2021-11-24 DIAGNOSIS — N186 End stage renal disease: Secondary | ICD-10-CM | POA: Diagnosis not present

## 2021-11-24 DIAGNOSIS — Z452 Encounter for adjustment and management of vascular access device: Secondary | ICD-10-CM | POA: Insufficient documentation

## 2021-11-24 DIAGNOSIS — Z87891 Personal history of nicotine dependence: Secondary | ICD-10-CM | POA: Insufficient documentation

## 2021-11-24 DIAGNOSIS — N185 Chronic kidney disease, stage 5: Secondary | ICD-10-CM

## 2021-11-24 DIAGNOSIS — D631 Anemia in chronic kidney disease: Secondary | ICD-10-CM

## 2021-11-24 DIAGNOSIS — I5032 Chronic diastolic (congestive) heart failure: Secondary | ICD-10-CM | POA: Diagnosis not present

## 2021-11-24 DIAGNOSIS — I953 Hypotension of hemodialysis: Secondary | ICD-10-CM | POA: Insufficient documentation

## 2021-11-24 DIAGNOSIS — Z79899 Other long term (current) drug therapy: Secondary | ICD-10-CM | POA: Diagnosis not present

## 2021-11-24 DIAGNOSIS — I959 Hypotension, unspecified: Secondary | ICD-10-CM | POA: Insufficient documentation

## 2021-11-24 DIAGNOSIS — E041 Nontoxic single thyroid nodule: Secondary | ICD-10-CM | POA: Insufficient documentation

## 2021-11-24 DIAGNOSIS — I132 Hypertensive heart and chronic kidney disease with heart failure and with stage 5 chronic kidney disease, or end stage renal disease: Secondary | ICD-10-CM | POA: Diagnosis not present

## 2021-11-24 DIAGNOSIS — I770 Arteriovenous fistula, acquired: Secondary | ICD-10-CM | POA: Diagnosis not present

## 2021-11-24 DIAGNOSIS — K219 Gastro-esophageal reflux disease without esophagitis: Secondary | ICD-10-CM | POA: Diagnosis not present

## 2021-11-24 DIAGNOSIS — R0989 Other specified symptoms and signs involving the circulatory and respiratory systems: Secondary | ICD-10-CM | POA: Diagnosis not present

## 2021-11-24 DIAGNOSIS — G4733 Obstructive sleep apnea (adult) (pediatric): Secondary | ICD-10-CM | POA: Insufficient documentation

## 2021-11-24 DIAGNOSIS — R0602 Shortness of breath: Secondary | ICD-10-CM | POA: Insufficient documentation

## 2021-11-24 DIAGNOSIS — Z4889 Encounter for other specified surgical aftercare: Secondary | ICD-10-CM | POA: Diagnosis not present

## 2021-11-24 HISTORY — PX: BASCILIC VEIN TRANSPOSITION: SHX5742

## 2021-11-24 LAB — POCT I-STAT, CHEM 8
BUN: 18 mg/dL (ref 6–20)
Calcium, Ion: 1.11 mmol/L — ABNORMAL LOW (ref 1.15–1.40)
Chloride: 99 mmol/L (ref 98–111)
Creatinine, Ser: 4.7 mg/dL — ABNORMAL HIGH (ref 0.44–1.00)
Glucose, Bld: 89 mg/dL (ref 70–99)
HCT: 34 % — ABNORMAL LOW (ref 36.0–46.0)
Hemoglobin: 11.6 g/dL — ABNORMAL LOW (ref 12.0–15.0)
Potassium: 3.8 mmol/L (ref 3.5–5.1)
Sodium: 136 mmol/L (ref 135–145)
TCO2: 24 mmol/L (ref 22–32)

## 2021-11-24 SURGERY — TRANSPOSITION, VEIN, BASILIC
Anesthesia: Monitor Anesthesia Care | Laterality: Left

## 2021-11-24 MED ORDER — AMISULPRIDE (ANTIEMETIC) 5 MG/2ML IV SOLN: 10.0000 mg | Freq: Once | INTRAVENOUS | Status: DC | PRN

## 2021-11-24 MED ORDER — HEMOSTATIC AGENTS (NO CHARGE) OPTIME
TOPICAL | Status: DC | PRN
  Administered 2021-11-24: 1 via TOPICAL

## 2021-11-24 MED ORDER — FENTANYL CITRATE (PF) 250 MCG/5ML IJ SOLN
INTRAMUSCULAR | Status: DC | PRN
  Administered 2021-11-24: 50 ug via INTRAVENOUS

## 2021-11-24 MED ORDER — PHENYLEPHRINE 40 MCG/ML (10ML) SYRINGE FOR IV PUSH (FOR BLOOD PRESSURE SUPPORT)
PREFILLED_SYRINGE | INTRAVENOUS | Status: DC | PRN
  Administered 2021-11-24 (×2): 40 ug via INTRAVENOUS
  Administered 2021-11-24: 120 ug via INTRAVENOUS
  Administered 2021-11-24: 80 ug via INTRAVENOUS

## 2021-11-24 MED ORDER — DEXAMETHASONE SODIUM PHOSPHATE 10 MG/ML IJ SOLN
INTRAMUSCULAR | Status: AC
  Filled 2021-11-24: qty 1

## 2021-11-24 MED ORDER — LIDOCAINE 2% (20 MG/ML) 5 ML SYRINGE
INTRAMUSCULAR | Status: AC
  Filled 2021-11-24: qty 5

## 2021-11-24 MED ORDER — FENTANYL CITRATE (PF) 100 MCG/2ML IJ SOLN: 25.0000 ug | INTRAMUSCULAR | Status: DC | PRN

## 2021-11-24 MED ORDER — CHLORHEXIDINE GLUCONATE 0.12 % MT SOLN
OROMUCOSAL | Status: AC
  Administered 2021-11-24: 15 mL via OROMUCOSAL
  Filled 2021-11-24: qty 15

## 2021-11-24 MED ORDER — CHLORHEXIDINE GLUCONATE 4 % EX LIQD: 60.0000 mL | Freq: Once | CUTANEOUS | Status: DC

## 2021-11-24 MED ORDER — ONDANSETRON HCL 4 MG/2ML IJ SOLN: 4.0000 mg | Freq: Once | INTRAMUSCULAR | Status: DC | PRN

## 2021-11-24 MED ORDER — MIDAZOLAM HCL 2 MG/2ML IJ SOLN
INTRAMUSCULAR | Status: DC | PRN
Start: 2021-11-24 — End: 2021-11-24
  Administered 2021-11-24: 2 mg via INTRAVENOUS

## 2021-11-24 MED ORDER — SODIUM CHLORIDE 0.9 % IV SOLN: INTRAVENOUS | Status: DC

## 2021-11-24 MED ORDER — ONDANSETRON HCL 4 MG/2ML IJ SOLN
INTRAMUSCULAR | Status: AC
  Filled 2021-11-24: qty 2

## 2021-11-24 MED ORDER — PAPAVERINE HCL 30 MG/ML IJ SOLN
INTRAMUSCULAR | Status: AC
  Filled 2021-11-24: qty 2

## 2021-11-24 MED ORDER — MIDAZOLAM HCL 2 MG/2ML IJ SOLN
INTRAMUSCULAR | Status: AC
  Filled 2021-11-24: qty 2

## 2021-11-24 MED ORDER — FENTANYL CITRATE (PF) 250 MCG/5ML IJ SOLN
INTRAMUSCULAR | Status: AC
  Filled 2021-11-24: qty 5

## 2021-11-24 MED ORDER — ONDANSETRON HCL 4 MG/2ML IJ SOLN
INTRAMUSCULAR | Status: DC | PRN
  Administered 2021-11-24: 4 mg via INTRAVENOUS

## 2021-11-24 MED ORDER — LIDOCAINE 2% (20 MG/ML) 5 ML SYRINGE
INTRAMUSCULAR | Status: DC | PRN
  Administered 2021-11-24: 40 mg via INTRAVENOUS

## 2021-11-24 MED ORDER — BUPIVACAINE HCL (PF) 0.5 % IJ SOLN
INTRAMUSCULAR | Status: AC
  Filled 2021-11-24: qty 30

## 2021-11-24 MED ORDER — PHENYLEPHRINE 40 MCG/ML (10ML) SYRINGE FOR IV PUSH (FOR BLOOD PRESSURE SUPPORT)
PREFILLED_SYRINGE | INTRAVENOUS | Status: AC
  Filled 2021-11-24: qty 10

## 2021-11-24 MED ORDER — LIDOCAINE-EPINEPHRINE 1 %-1:100000 IJ SOLN
INTRAMUSCULAR | Status: AC
  Filled 2021-11-24: qty 1

## 2021-11-24 MED ORDER — 0.9 % SODIUM CHLORIDE (POUR BTL) OPTIME
TOPICAL | Status: DC | PRN
  Administered 2021-11-24: 1000 mL

## 2021-11-24 MED ORDER — DEXAMETHASONE SODIUM PHOSPHATE 10 MG/ML IJ SOLN
INTRAMUSCULAR | Status: DC | PRN
  Administered 2021-11-24: 4 mg via INTRAVENOUS

## 2021-11-24 MED ORDER — PROPOFOL 10 MG/ML IV BOLUS
INTRAVENOUS | Status: AC
  Filled 2021-11-24: qty 20

## 2021-11-24 MED ORDER — PROPOFOL 500 MG/50ML IV EMUL
INTRAVENOUS | Status: DC | PRN
  Administered 2021-11-24: 50 ug/kg/min via INTRAVENOUS

## 2021-11-24 MED ORDER — HEPARIN 6000 UNIT IRRIGATION SOLUTION
Status: AC
  Filled 2021-11-24: qty 500

## 2021-11-24 MED ORDER — CHLORHEXIDINE GLUCONATE 0.12 % MT SOLN: 15.0000 mL | Freq: Once | OROMUCOSAL | Status: AC

## 2021-11-24 MED ORDER — ALBUMIN HUMAN 5 % IV SOLN: INTRAVENOUS | Status: DC | PRN

## 2021-11-24 MED ORDER — ROPIVACAINE HCL 5 MG/ML IJ SOLN
INTRAMUSCULAR | Status: DC | PRN
  Administered 2021-11-24: 30 mL via PERINEURAL

## 2021-11-24 MED ORDER — ACETAMINOPHEN 500 MG PO TABS
1000.0000 mg | ORAL_TABLET | Freq: Once | ORAL | Status: AC
  Administered 2021-11-24: 1000 mg via ORAL
  Filled 2021-11-24: qty 2

## 2021-11-24 MED ORDER — TRAMADOL HCL 50 MG PO TABS: 50.0000 mg | ORAL_TABLET | Freq: Four times a day (QID) | ORAL | 0 refills | Status: AC | PRN

## 2021-11-24 MED ORDER — CEFAZOLIN SODIUM-DEXTROSE 2-4 GM/100ML-% IV SOLN
2.0000 g | INTRAVENOUS | Status: AC
  Administered 2021-11-24: 2 g via INTRAVENOUS
  Filled 2021-11-24: qty 100

## 2021-11-24 MED ORDER — HEPARIN 6000 UNIT IRRIGATION SOLUTION
Status: DC | PRN
  Administered 2021-11-24: 1

## 2021-11-24 SURGICAL SUPPLY — 48 items
ADH SKN CLS APL DERMABOND .7 (GAUZE/BANDAGES/DRESSINGS) ×1
AGENT HMST KT MTR STRL THRMB (HEMOSTASIS) ×1
ARMBAND PINK RESTRICT EXTREMIT (MISCELLANEOUS) ×3 IMPLANT
BAG COUNTER SPONGE SURGICOUNT (BAG) ×3 IMPLANT
BAG SPNG CNTER NS LX DISP (BAG) ×1
BNDG CMPR MED 10X6 ELC LF (GAUZE/BANDAGES/DRESSINGS) ×1
BNDG ELASTIC 6X10 VLCR STRL LF (GAUZE/BANDAGES/DRESSINGS) ×1 IMPLANT
BNDG GAUZE ELAST 4 BULKY (GAUZE/BANDAGES/DRESSINGS) ×1 IMPLANT
CANISTER SUCT 3000ML PPV (MISCELLANEOUS) ×3 IMPLANT
CLIP TI WIDE RED SMALL 6 (CLIP) ×1 IMPLANT
CLIP VESOCCLUDE MED 24/CT (CLIP) IMPLANT
CLIP VESOCCLUDE MED 6/CT (CLIP) IMPLANT
CLIP VESOCCLUDE SM WIDE 24/CT (CLIP) IMPLANT
CLIP VESOCCLUDE SM WIDE 6/CT (CLIP) IMPLANT
COVER PROBE W GEL 5X96 (DRAPES) ×3 IMPLANT
DERMABOND ADVANCED (GAUZE/BANDAGES/DRESSINGS) ×1
DERMABOND ADVANCED .7 DNX12 (GAUZE/BANDAGES/DRESSINGS) ×2 IMPLANT
ELECT REM PT RETURN 9FT ADLT (ELECTROSURGICAL) ×2
ELECTRODE REM PT RTRN 9FT ADLT (ELECTROSURGICAL) ×2 IMPLANT
GLOVE SRG 8 PF TXTR STRL LF DI (GLOVE) ×2 IMPLANT
GLOVE SURG POLYISO LF SZ7.5 (GLOVE) ×3 IMPLANT
GLOVE SURG UNDER POLY LF SZ8 (GLOVE) ×2
GOWN STRL REUS W/ TWL LRG LVL3 (GOWN DISPOSABLE) ×4 IMPLANT
GOWN STRL REUS W/ TWL XL LVL3 (GOWN DISPOSABLE) ×2 IMPLANT
GOWN STRL REUS W/TWL LRG LVL3 (GOWN DISPOSABLE) ×8
GOWN STRL REUS W/TWL XL LVL3 (GOWN DISPOSABLE) ×2
HEMOSTAT SNOW SURGICEL 2X4 (HEMOSTASIS) IMPLANT
KIT BASIN OR (CUSTOM PROCEDURE TRAY) ×3 IMPLANT
KIT TURNOVER KIT B (KITS) ×3 IMPLANT
NDL 18GX1X1/2 (RX/OR ONLY) (NEEDLE) ×2 IMPLANT
NEEDLE 18GX1X1/2 (RX/OR ONLY) (NEEDLE) ×2 IMPLANT
NS IRRIG 1000ML POUR BTL (IV SOLUTION) ×3 IMPLANT
PACK CV ACCESS (CUSTOM PROCEDURE TRAY) ×3 IMPLANT
PAD ARMBOARD 7.5X6 YLW CONV (MISCELLANEOUS) ×6 IMPLANT
SET COLLECT BLD 21X3/4 12 (NEEDLE) ×1 IMPLANT
SLING ARM FOAM STRAP LRG (SOFTGOODS) IMPLANT
SLING ARM FOAM STRAP MED (SOFTGOODS) IMPLANT
SURGIFLO W/THROMBIN 8M KIT (HEMOSTASIS) ×1 IMPLANT
SUT PROLENE 6 0 BV (SUTURE) ×2 IMPLANT
SUT PROLENE 6 0 CC (SUTURE) ×3 IMPLANT
SUT SILK 2 0 SH (SUTURE) IMPLANT
SUT VIC AB 3-0 SH 27 (SUTURE) ×6
SUT VIC AB 3-0 SH 27X BRD (SUTURE) ×2 IMPLANT
SUT VICRYL 4-0 PS2 18IN ABS (SUTURE) ×3 IMPLANT
SYR 30ML LL (SYRINGE) ×3 IMPLANT
TOWEL GREEN STERILE (TOWEL DISPOSABLE) ×3 IMPLANT
UNDERPAD 30X36 HEAVY ABSORB (UNDERPADS AND DIAPERS) ×3 IMPLANT
WATER STERILE IRR 1000ML POUR (IV SOLUTION) ×3 IMPLANT

## 2021-11-24 NOTE — Progress Notes (Signed)
Orthopedic Tech Progress Note ?Patient Details:  ?Lauren Osborn ?1976/08/19 ?[id number] ? ?Ortho Devices ?Type of Ortho Device: Arm sling ?Ortho Device/Splint Location: LUE ?Ortho Device/Splint Interventions: Application, Ordered ?  ?Post Interventions ?Patient Tolerated: Well ? ?[name] ?11/24/2021, 10:09 AM ? ?

## 2021-11-24 NOTE — Discharge Instructions (Signed)
? ?[facility] ? ?Discharge Instructions ? ?AV Fistula or Graft Surgery for Dialysis Access ? ?Please refer to the following instructions for your post-procedure care. Your surgeon or physician assistant will discuss any changes with you. ? ?Activity ? ?You may drive the day following your surgery, if you are comfortable and no longer taking prescription pain medication. Resume full activity as the soreness in your incision resolves. ? ?Bathing/Showering ? ?You may shower after you go home. Keep your incision dry for 48 hours. Do not soak in a bathtub, hot tub, or swim until the incision heals completely. You may not shower if you have a hemodialysis catheter. ? ?Incision Care ? ?Clean your incision with mild soap and water after 48 hours. Pat the area dry with a clean towel. You do not need a bandage unless otherwise instructed. Do not apply any ointments or creams to your incision. You may have skin glue on your incision. Do not peel it off. It will come off on its own in about one week. Your arm may swell a bit after surgery. To reduce swelling use pillows to elevate your arm so it is above your heart. Your doctor will tell you if you need to lightly wrap your arm with an ACE bandage. ? ?Diet ? ?Resume your normal diet. There are not special food restrictions following this procedure. In order to heal from your surgery, it is CRITICAL to get adequate nutrition. Your body requires vitamins, minerals, and protein. Vegetables are the best source of vitamins and minerals. Vegetables also provide the perfect balance of protein. Processed food has little nutritional value, so try to avoid this. ? ?Medications ? ?Resume taking all of your medications. If your incision is causing pain, you may take over-the counter pain relievers such as acetaminophen (Tylenol). If you were prescribed a stronger pain medication, please be aware these medications can cause nausea and constipation. Prevent  nausea by taking the medication with a snack or meal. Avoid constipation by drinking plenty of fluids and eating foods with high amount of fiber, such as fruits, vegetables, and grains.  ?Do not take Tylenol if you are taking prescription pain medications. ? ?Follow up ?Your surgeon may want to see you in the office following your access surgery. If so, this will be arranged at the time of your surgery. ? ?Please call us immediately for any of the following conditions: ? ?Increased pain, redness, drainage (pus) from your incision site ?Fever of 101 degrees or higher ?Severe or worsening pain at your incision site ?Hand pain or numbness. ? ?Reduce your risk of vascular disease: ? ?Stop smoking. If you would like help, call [facility] at 1-800-QUIT-NOW 450-065-3434) or [place] at 3085187427 ? ?Manage your cholesterol ?Maintain a desired weight ?Control your diabetes ?Keep your blood pressure down ? ?Dialysis ? ?It will take several weeks to several months for your new dialysis access to be ready for use. Your surgeon will determine when it is okay to use it. Your nephrologist will continue to direct your dialysis. You can continue to use your Permcath until your new access is ready for use. ? ? ?11/24/2021 ?Lauren Osborn ?[id number] ?1975-10-29 ? ?Surgeon(s): ?Serafina Mitchell, MD ? ?Procedure(s): ?LEFT SECOND STAGE BRACHOCEPHALIC VEIN TRANSPOSITION ? ? May stick graft immediately  ? May stick graft on designated area only:   ?X Do not stick left AV fistula  for 6 weeks  ? ? ?If you have any questions, please call the  office at [phone]. ?

## 2021-11-24 NOTE — Transfer of Care (Signed)
Immediate Anesthesia Transfer of Care Note ? ?Patient: Lauren Osborn ? ?Procedure(s) Performed: LEFT SECOND STAGE BRACHOCEPHALIC VEIN TRANSPOSITION (Left) ? ?Patient Location: PACU ? ?Anesthesia Type:MAC and Regional ? ?Level of Consciousness: oriented, sedated and patient cooperative ? ?Airway & Oxygen Therapy: Patient Spontanous Breathing and Patient connected to face mask oxygen ? ?Post-op Assessment: Report given to RN and Post -op Vital signs reviewed and stable ? ?Post vital signs: Reviewed ? ?Last Vitals:  ?Vitals Value Taken Time  ?BP 139/65 11/24/21 0923  ?Temp    ?Pulse 72 11/24/21 0927  ?Resp 15 11/24/21 0927  ?SpO2 96 % 11/24/21 0927  ?Vitals shown include unvalidated device data. ? ?Last Pain:  ?Vitals:  ? 11/24/21 0556  ?TempSrc:   ?PainSc: 0-No pain  ?   ? ?  ? ?Complications: No notable events documented. ?

## 2021-11-24 NOTE — Interval H&P Note (Signed)
History and Physical Interval Note: ? ?11/24/2021 ?7:29 AM ? ?Lauren Osborn  has presented today for surgery, with the diagnosis of ESRD.  The various methods of treatment have been discussed with the patient and family. After consideration of risks, benefits and other options for treatment, the patient has consented to  Procedure(s): ?LEFT SECOND STAGE BRACHOCEPHALIC VEIN TRANSPOSITION (Left) as a surgical intervention.  The patient's history has been reviewed, patient examined, no change in status, stable for surgery.  I have reviewed the patient's chart and labs.  Questions were answered to the patient's satisfaction.   ? ? ?Lauren Osborn ? ? ?

## 2021-11-24 NOTE — Op Note (Signed)
? ? ?  Patient name: Lauren Osborn MRN: [id number] DOB: 1975-09-05 Sex: female ? ?11/24/2021 ?Pre-operative Diagnosis: ESRD ?Post-operative diagnosis:  Same ?Surgeon:  Annamarie Major ?Assistants:  Ivin Booty, PA ?Procedure:   revision of right brachiocephalic fistula with elevation and branch ligation ?Anesthesia:  Regional ?Blood Loss:  50 cc ?Specimens:  none ? ?Findings:  5-6 mm vein ? ?Indications: This is a 46 year old female with end-stage renal disease who has a left brachiocephalic fistula that is difficult to cannulate.  Ultrasound suggested that it was of good caliber but very deep.  She comes in today for revision.  She also has several large branches. ? ?Procedure:  The patient was identified in the holding area and taken to [place] 12  The patient was then placed supine on the table. regional anesthesia was administered.  The patient was prepped and draped in the usual sterile fashion.  A time out was called and antibiotics were administered.  A PA was necessary to expedite the procedure and assist with technical details. ? ?Ultrasound was used to map the course of the cephalic vein in the upper arm.  I then made 2 incisions over top of the fistula.  The PA helped with exposure by providing countertraction and suctioning.  I then circumferentially exposed the fistula within both incisions.  There were multiple side branches that were ligated between silk ties.  The patient is at baseline very hypotensive and was hypotensive during the surgery.  After dissecting the fistula, it had just trickle flow in it and had spasm down.  I therefore cannulated the fistula with a butterfly needle and instilled heparinized saline to distend the vein back to its normal caliber.  It distended to approximately 6 mm.  I then closed the cannulation site with a 6-0 Prolene.  I then reapproximated the subcutaneous tissue posterior to the fistula with interrupted 3-0 Vicryl.  Both the PA and myself closed the incisions with 4-0  Vicryl and Dermabond.  Sterile dressings were applied including and light Ace wrap for compression.  There were no immediate complications. ? ? ?Disposition: To PACU stable. ? ? ?V. Annamarie Major, M.D., FACS ?[facility] ?Office: [phone] ?Pager:  [phone]  ?

## 2021-11-24 NOTE — Progress Notes (Signed)
Thrill and Bruit present to Left arm ?

## 2021-11-24 NOTE — Anesthesia Procedure Notes (Signed)
Procedure Name: [place] ?Date/Time: 11/24/2021 7:40 AM ?Performed by: Jenne Campus, CRNA ?Pre-anesthesia Checklist: Patient identified, Emergency Drugs available, Suction available and Patient being monitored ?Oxygen Delivery Method: Simple face mask ? ? ? ? ?

## 2021-11-24 NOTE — Anesthesia Procedure Notes (Signed)
Anesthesia Regional Block: Supraclavicular block  ? ?Pre-Anesthetic Checklist: , timeout performed,  Correct Patient, Correct Site, Correct Laterality,  Correct Procedure, Correct Position, site marked,  Risks and benefits discussed,  Surgical consent,  Pre-op evaluation,  At surgeon's request and post-op pain management ? ?Laterality: Left ? ?Prep: chloraprep     ?  ?Needles:  ?Injection technique: Single-shot ? ?Needle Type: Echogenic Stimulator Needle   ? ? ?Needle Length: 10cm  ?Needle Gauge: 20  ? ? ? ?Additional Needles: ? ? ?Procedures:,,,, ultrasound used (permanent image in chart),,    ?Narrative:  ?Start time: 11/24/2021 7:00 AM ?End time: 11/24/2021 7:05 AM ?Injection made incrementally with aspirations every 5 mL. ? ?Performed by: Personally  ?Anesthesiologist: Merlinda Frederick, MD ? ? ? ? ?

## 2021-11-24 NOTE — Anesthesia Postprocedure Evaluation (Signed)
Anesthesia Post Note ? ?Patient: Lauren Osborn ? ?Procedure(s) Performed: LEFT SECOND STAGE BRACHOCEPHALIC VEIN TRANSPOSITION (Left) ? ?  ? ?Patient location during evaluation: PACU ?Anesthesia Type: Regional ?Level of consciousness: awake and alert ?Pain management: pain level controlled ?Vital Signs Assessment: post-procedure vital signs reviewed and stable ?Respiratory status: spontaneous breathing, nonlabored ventilation and respiratory function stable ?Cardiovascular status: stable and blood pressure returned to baseline ?Postop Assessment: no apparent nausea or vomiting ?Anesthetic complications: no ? ? ?No notable events documented. ? ?Last Vitals:  ?Vitals:  ? 11/24/21 0940 11/24/21 0950  ?BP: 101/61 99/61  ?Pulse: 70 67  ?Resp: 17 20  ?Temp:  36.5 ?C  ?SpO2: 96% 95%  ?  ?Last Pain:  ?Vitals:  ? 11/24/21 0950  ?TempSrc:   ?PainSc: 0-No pain  ? ? ?  ?  ?  ?  ?  ?  ? ?Merlinda Frederick ? ? ? ? ?

## 2021-11-25 ENCOUNTER — Encounter (HOSPITAL_COMMUNITY): Payer: Self-pay | Admitting: Surgery

## 2021-11-28 ENCOUNTER — Telehealth: Payer: Self-pay | Admitting: Cardiology

## 2021-11-28 NOTE — Telephone Encounter (Signed)
? ? ?  Given that she is on dialysis and no longer on diuretic therapy, management of her fluid is per dialysis and her Nephrologist is the one that would determine the goal for her dry weight. I would recommend they review with her Nephrologist and see if the amount of fluid being drawn off during sessions needs to be increased since she is over her dry weight.  ? ?Signed, ?Erma Heritage, PA-C ?11/28/2021, 2:10 PM ? ? ?

## 2021-11-28 NOTE — Telephone Encounter (Signed)
Pt c/o swelling: STAT is pt has developed SOB within 24 hours ? ?If swelling, where is the swelling located? legs ? ?How much weight have you gained and in what time span? 10 kg in 2 weeks ? ?Have you gained 3 pounds in a day or 5 pounds in a week?  ? ?Do you have a log of your daily weights (if so, list)?  ? 11/11/21: 92.2 kg post dialysis ? 11/25/21: 102.7 kg post dialysis ? 11/28/21: 104.9 kg pre dialysis ? ?Are you currently taking a fluid pill? no ? ?Are you currently SOB? no ? ?Have you traveled recently? No ? ?Patient is at dialysis. Tech called and reported the weight gain. Patient does not urinate at all.   ?

## 2021-11-28 NOTE — Telephone Encounter (Signed)
Left message for patient to return call. I spoke with dialysis and they had no idea that patient had called here. The nurse told me he had discussed with her that she is drinking too much fluid and they have even given her extra time on dialysis for the last 2 weeks. ?

## 2021-11-28 NOTE — Telephone Encounter (Signed)
I spoke with [facility] and told her that I would message B.Strader, PA-C and advise her of situation. Nephrology now manages her fluid overload. ?

## 2021-11-29 NOTE — Telephone Encounter (Signed)
Verbalized recommendation per Bernerd Pho, PA-C to pt who verbalized understanding. Pt stated she would reach out to nephrology for assessment of dry weight ?

## 2021-12-06 ENCOUNTER — Other Ambulatory Visit: Payer: Self-pay | Admitting: Physician Assistant

## 2021-12-06 DIAGNOSIS — R490 Dysphonia: Secondary | ICD-10-CM

## 2021-12-06 DIAGNOSIS — J3801 Paralysis of vocal cords and larynx, unilateral: Secondary | ICD-10-CM

## 2021-12-07 ENCOUNTER — Other Ambulatory Visit: Payer: Self-pay | Admitting: Cardiology

## 2021-12-12 ENCOUNTER — Telehealth: Payer: Self-pay | Admitting: Cardiology

## 2021-12-12 ENCOUNTER — Encounter: Payer: Self-pay | Admitting: Internal Medicine

## 2021-12-12 ENCOUNTER — Telehealth: Payer: Self-pay | Admitting: *Deleted

## 2021-12-12 NOTE — Telephone Encounter (Signed)
Pt has been scheduled for a telephone visit, 4/123/23 1:00.  Medications reconciled / consent on file. ? ? ?  ?Patient Consent for Virtual Visit  ? ? ?   ? ?Lauren Osborn has provided verbal consent on 12/12/2021 for a virtual visit (video or telephone). ? ? ?CONSENT FOR VIRTUAL VISIT FOR:  Lauren Osborn  ?By participating in this virtual visit I agree to the following: ? ?I hereby voluntarily request, consent and authorize [facility] and its employed or contracted physicians, physician assistants, nurse practitioners or other licensed health care professionals (the Practitioner), to provide me with telemedicine health care services (the ?Services") as deemed necessary by the treating Practitioner. I acknowledge and consent to receive the Services by the Practitioner via telemedicine. I understand that the telemedicine visit will involve communicating with the Practitioner through live audiovisual communication technology and the disclosure of certain medical information by electronic transmission. I acknowledge that I have been given the opportunity to request an in-person assessment or other available alternative prior to the telemedicine visit and am voluntarily participating in the telemedicine visit. ? ?I understand that I have the right to withhold or withdraw my consent to the use of telemedicine in the course of my care at any time, without affecting my right to future care or treatment, and that the Practitioner or I may terminate the telemedicine visit at any time. I understand that I have the right to inspect all information obtained and/or recorded in the course of the telemedicine visit and may receive copies of available information for a reasonable fee.  I understand that some of the potential risks of receiving the Services via telemedicine include:  ?Delay or interruption in medical evaluation due to technological equipment failure or disruption; ?Information transmitted may not be sufficient  (e.g. poor resolution of images) to allow for appropriate medical decision making by the Practitioner; and/or  ?In rare instances, security protocols could fail, causing a breach of personal health information. ? ?Furthermore, I acknowledge that it is my responsibility to provide information about my medical history, conditions and care that is complete and accurate to the best of my ability. I acknowledge that Practitioner's advice, recommendations, and/or decision may be based on factors not within their control, such as incomplete or inaccurate data provided by me or distortions of diagnostic images or specimens that may result from electronic transmissions. I understand that the practice of medicine is not an exact science and that Practitioner makes no warranties or guarantees regarding treatment outcomes. I acknowledge that a copy of this consent can be made available to me via my patient portal ([facility]), or I can request a printed copy by calling the office of [facility].   ? ?I understand that my insurance will be billed for this visit.  ? ?I have read or had this consent read to me. ?I understand the contents of this consent, which adequately explains the benefits and risks of the Services being provided via telemedicine.  ?I have been provided ample opportunity to ask questions regarding this consent and the Services and have had my questions answered to my satisfaction. ?I give my informed consent for the services to be provided through the use of telemedicine in my medical care ? ? ? ?

## 2021-12-12 NOTE — Telephone Encounter (Signed)
Primary Cardiologist:Samuel Domenic Polite, MD ? ?Chart reviewed as part of pre-operative protocol coverage. Because of Lauren Osborn's past medical history and time since last visit, he/she will require a virtual visit/telephone call in order to better assess preoperative cardiovascular risk. ? ?Pre-op covering staff: ?- Please contact patient, obtain consent, and schedule appointment  ? ?If applicable, this message will also be routed to pharmacy pool and/or primary cardiologist for input on holding anticoagulant/antiplatelet agent as requested below so that this information is available at time of patient's appointment.  ? ? ?Emmaline Life, NP-C ? ?  ?12/12/2021, 9:16 AM ?[name] ?[address], [address] ?Office [phone] Fax [phone] ? ?

## 2021-12-12 NOTE — Telephone Encounter (Signed)
? ?  Pre-operative Risk Assessment  ?  ?Patient Name: Lauren Osborn  ?DOB: Aug 09, 1976 ?MRN: [id number]  ? ?  ? ?Request for Surgical Clearance   ? ?Procedure:   Lap pd Cath Omentopexy  ? ?Date of Surgery:  Clearance 12/22/21                              ?   ?Surgeon:  Dr. Raul Del  ?Surgeon's Group or Practice Name:  [facility] ?Phone number:  [phone] ?Fax number:  [phone] ?  ?Type of Clearance Requested:   ?Does not indicate  ?  ?Type of Anesthesia:  Not Indicated ?  ?Additional requests/questions:   ? ?Signed, ?Malanie C Hildebrandt   ?12/12/2021, 8:32 AM  ? ?

## 2021-12-12 NOTE — Telephone Encounter (Signed)
Pt has been scheduled for a telephone visit, 4/123/23 1:00. ?

## 2021-12-14 ENCOUNTER — Ambulatory Visit (INDEPENDENT_AMBULATORY_CARE_PROVIDER_SITE_OTHER): Payer: Medicare Other | Admitting: Nurse Practitioner

## 2021-12-14 ENCOUNTER — Encounter: Payer: Self-pay | Admitting: Nurse Practitioner

## 2021-12-14 DIAGNOSIS — Z01810 Encounter for preprocedural cardiovascular examination: Secondary | ICD-10-CM

## 2021-12-14 NOTE — Progress Notes (Signed)
? ?Virtual Visit via Telephone Note  ? ?This visit type was conducted due to national recommendations for restrictions regarding the COVID-19 Pandemic (e.g. social distancing) in an effort to limit this patient's exposure and mitigate transmission in our community.  Due to her co-morbid illnesses, this patient is at least at moderate risk for complications without adequate follow up.  This format is felt to be most appropriate for this patient at this time.  The patient did not have access to video technology/had technical difficulties with video requiring transitioning to audio format only (telephone).  All issues noted in this document were discussed and addressed.  No physical exam could be performed with this format.  Please refer to the patient's chart for her  consent to telehealth for [facility]. ? ?Evaluation Performed:  Preoperative cardiovascular risk assessment ?_____________  ? ?Date:  12/14/2021  ? ?Patient ID:  Lauren Osborn, Lauren Osborn October 30, 1975, MRN [id number] ?Patient Location:  ?Home ?Provider location:   ?Office ? ?Primary Care Provider:  Carrolyn Meiers, MD ?Primary Cardiologist:  Rozann Lesches, MD ? ?Chief Complaint  ?  ?46 y.o. y/o female with a h/o chronic HFpEF, prominent RV dysfunction, moderate pulmonary hypertension, gout, OSA, ESRD, and cirrhosis who is pending laparoscopic PD cath omentopexy, and presents today for telephonic preoperative cardiovascular risk assessment. ? ?Past Medical History  ?  ?Past Medical History:  ?Diagnosis Date  ? Abnormal bilirubin test   ? Cholelithiasis   ? Chronic kidney disease, stage 3b ([place])   ? Diastolic congestive heart failure ([place])   ? Esophagitis   ? Essential hypertension   ? Fibroids   ? Gastritis   ? GERD (gastroesophageal reflux disease)   ? Gout   ? Liver hemangioma   ? Morbid obesity ([place])   ? Normocytic anemia   ? OSA (obstructive sleep apnea)   ? Thyroid nodule   ? Tubular adenoma   ? ?Past Surgical History:  ?Procedure Laterality  Date  ? AV FISTULA PLACEMENT Left 08/19/2021  ? Procedure: LEFT ARM ARTERIOVENOUS (AV) FISTULA;  Surgeon: Serafina Mitchell, MD;  Location: [facility];  Service: Vascular;  Laterality: Left;  ? BASCILIC VEIN TRANSPOSITION Left 11/24/2021  ? Procedure: LEFT SECOND STAGE [place];  Surgeon: Serafina Mitchell, MD;  Location: [facility];  Service: Vascular;  Laterality: Left;  ? BIOPSY  08/03/2020  ? Procedure: BIOPSY;  Surgeon: Eloise Harman, DO;  [facility];  Service: Endoscopy;;  duodenal ?gastric  ? CESAREAN SECTION    ? COLONOSCOPY WITH PROPOFOL N/A 08/03/2020  ?  Surgeon: Hurshel Keys K, DO; 5 mm tubular adenoma in the sigmoid colon, nonbleeding internal hemorrhoids.  Recommended repeat in 5 years.  ? ESOPHAGOGASTRODUODENOSCOPY (EGD) WITH PROPOFOL N/A 08/03/2020  ? Surgeon: Eloise Harman, DO; LA grade C esophagitis without bleeding, gastritis with erythema and shallow ulcerations in gastric antrum biopsied (negative for H. pylori), normal examined duodenum s/p biopsy (benign).  ? HYSTERECTOMY ABDOMINAL WITH SALPINGECTOMY  2008  ? IR FLUORO GUIDE CV LINE RIGHT  08/15/2021  ? IR US GUIDE VASC ACCESS RIGHT  08/15/2021  ? POLYPECTOMY  08/03/2020  ? Procedure: POLYPECTOMY;  Surgeon: Eloise Harman, DO;  [facility];  Service: Endoscopy;;  colon  ? RIGHT HEART CATH N/A 07/26/2021  ? Procedure: RIGHT HEART CATH;  Surgeon: Larey Dresser, MD;  Location: [facility];  Service: Cardiovascular;  Laterality: N/A;  ? THYROIDECTOMY Right 10/21/2018  ? Procedure: RIGHT HEMITHYROIDECTOMY;  Surgeon:  Leta Baptist, MD;  Location: [facility];  Service: ENT;  Laterality: Right;  ? ? ?Allergies ? ?Allergies  ?Allergen Reactions  ? Oxycodone-Acetaminophen Hives and Other (See Comments)  ? ? ?History of Present Illness  ?  ?Lauren Osborn is a 46 y.o. female who presents via audio/video conferencing for a telehealth visit today.  Pt was last seen in cardiology clinic  on 09/12/21 by Dr. Aundra Dubin at [facility] and on 08/26/21 at [facility] by Bernerd Pho, [place].  At that time Lauren Osborn was doing well.  The patient is now pending PD cath insertion.  Since her last visit, she reports she has been stable. She has occasional lightheadedness particularly following hemodialysis, no syncope.  Continues to take midodrine as needed. She denies chest pain or worsening dyspnea when walking on flat ground, does light housework. Currently, she has mild dyspnea and edema due to unable to finish HD because there was a problem with the machine. She denies orthopnea, PND. No palpitations. Continued compliance with CPAP.  ? ? ?Home Medications  ?  ?Prior to Admission medications   ?Medication Sig Start Date End Date Taking? Authorizing Provider  ?acetaminophen (TYLENOL) 500 MG tablet Take 1,000 mg by mouth every 6 (six) hours as needed for moderate pain or headache.    [provider]  ?allopurinol (ZYLOPRIM) 100 MG tablet Take 1 tablet (100 mg total) by mouth 3 (three) times a week. ?Patient taking differently: Take 100 mg by mouth every Monday, Wednesday, and Friday. 08/26/21   Strader, Fransisco Hertz, PA-C  ?midodrine (PROAMATINE) 5 MG tablet TAKE 3 TABLETS BY MOUTH 3 TIMES A DAY WITH MEALS 12/07/21   Satira Sark, MD  ?pantoprazole (PROTONIX) 40 MG tablet Take 1 tablet (40 mg total) by mouth daily. ?Patient taking differently: Take 40 mg by mouth daily before lunch. 08/20/21   Erma Heritage, PA-C  ?TART CHERRY PO Take 1 tablet by mouth 3 (three) times a week. Tuesdays, Saturdays & Sundays in the morning.    [provider]  ?traMADol (ULTRAM) 50 MG tablet Take 1 tablet (50 mg total) by mouth every 6 (six) hours as needed. 11/24/21 11/24/22  [name], PA-C  ? ? ?Physical Exam  ?  ?Vital Signs:  Lauren Osborn does not have vital signs available for review today. ? ?Given telephonic nature of communication, physical exam is limited. ?AAOx3. NAD. Normal  affect.  Speech and respirations are unlabored. ? ?Accessory Clinical Findings  ?  ?None ? ?Assessment & Plan  ?  ?1.  Preoperative Cardiovascular Risk Assessment: Patient is currently stable from a cardiac perspective and may proceed to surgery without further cardiac testing. According to the Revised Cardiac Risk Index (RCRI), her Perioperative Risk of Major Cardiac Event is (%): 6.6. Her Functional Capacity in METs is: 4.64 according to the Duke Activity Status Index (DASI). ? ? ?A copy of this note will be routed to requesting surgeon. ? ?Time:   ?Today, I have spent 10 minutes with the patient with telehealth technology discussing medical history, symptoms, and management plan.   ? ? ?Emmaline Life, NP-C ? ?  ?12/14/2021, 1:04 PM ?[facility] ?[address] ?Office [phone] Fax [phone] ? ?

## 2021-12-15 ENCOUNTER — Telehealth: Payer: Self-pay | Admitting: Pulmonary Disease

## 2021-12-15 NOTE — Telephone Encounter (Signed)
OV notes and clearance form have been faxed back to [facility] . Nothing further needed. ?

## 2021-12-15 NOTE — Telephone Encounter (Signed)
Fax received from [facility] to perform a Lap PD Cath Omentopexy on patient.  Patient needs surgery clearance. Patient was seen on 11/08/2021. Office protocol is a risk assessment can be sent to surgeon if patient has been seen in 60 days or less.  ? ?Sending to Dr. Elsworth Soho for risk assessment or recommendations if patient needs to be seen in office prior to surgical procedure.   ?

## 2021-12-20 ENCOUNTER — Other Ambulatory Visit: Payer: Self-pay | Admitting: *Deleted

## 2021-12-20 NOTE — Patient Outreach (Signed)
?Medicaid Managed Care   ?Nurse Care Manager Note ? ?12/20/2021 ?Name:  Lauren Osborn MRN:  [id number] DOB:  01-08-1976 ? ?Lauren Osborn is an 46 y.o. year old female who is a primary Lauren Osborn of Lauren Osborn, Lauren Baxter, MD.  The Lauren Osborn Managed Care Coordination team was consulted for assistance with:    ?CHF ?ESRD ? ?Lauren Osborn was given information about Medicaid Managed Care Coordination team services today. Lauren Osborn Lauren Osborn agreed to services and verbal consent obtained. ? ?Engaged with Lauren Osborn by telephone for follow up visit in response to provider referral for case management and/or care coordination services.  ? ?Assessments/Interventions:  Review of past medical history, allergies, medications, health status, including review of consultants reports, laboratory and other test data, was performed as part of comprehensive evaluation and provision of chronic care management services. ? ?SDOH (Social Determinants of Health) assessments and interventions performed: ? ? ?Care Plan ? ?Allergies  ?Allergen Reactions  ? Oxycodone-Acetaminophen Hives and Other (See Comments)  ? ? ?Medications Reviewed Today   ? ? Reviewed by Lauren Montane, RN (Registered Nurse) on 12/20/21 at 1104  Med List Status: <None>  ? ?Medication Order Taking? Sig Documenting Provider Last Dose Status Informant  ?acetaminophen (TYLENOL) 500 MG tablet [id number] No Take 1,000 mg by mouth every 6 (six) hours as needed for moderate pain or headache. [provider] Taking Active Self  ?Lauren Osborn (ZYLOPRIM) 100 MG tablet [id number] No Take 1 tablet (100 mg total) by mouth 3 (three) times a week.  ?Lauren Osborn taking differently: Take 100 mg by mouth every Monday, Wednesday, and Friday.  ? Lauren Heritage, PA-C Taking Active Self  ?midodrine (PROAMATINE) 5 MG tablet [id number] No TAKE 3 TABLETS BY MOUTH 3 TIMES A DAY WITH MEALS Lauren Sark, MD Taking Active   ?pantoprazole (PROTONIX) 40 MG tablet [id number] No Take 1 tablet  (40 mg total) by mouth daily.  ?Lauren Osborn taking differently: Take 40 mg by mouth daily before lunch.  ? Lauren Heritage, PA-C Taking Active Self  ?TART CHERRY PO [id number] No Take 1 tablet by mouth 3 (three) times a week. Tuesdays, Saturdays & Sundays in the morning. [provider] Taking Active Self  ?         ?Med Note Lauren Osborn Nov 21, 2021 12:38 PM)    ?traMADol (ULTRAM) 50 MG tablet [id number] No Take 1 tablet (50 mg total) by mouth every 6 (six) hours as needed. [name], PA-C Taking Active   ? ?  ?  ? ?  ? ? ?Lauren Osborn Active Problem List  ? Diagnosis Date Noted  ? Cardiomegaly 09/19/2021  ? Essential hypertension 09/19/2021  ? Iron deficiency anemia 09/19/2021  ? Abdominal pain 07/23/2021  ? Vitamin D deficiency 07/23/2021  ? Hepatic cirrhosis (Lauren Osborn) 07/23/2021  ? Hepatic hemangioma 07/23/2021  ? Right upper quadrant abdominal pain 07/22/2021  ? Rectal bleeding 07/22/2021  ? Shortness of breath 07/22/2021  ? Acute exacerbation of CHF (congestive heart failure) (Lauren Osborn) 07/22/2021  ? CKD (chronic kidney disease), stage IV (Lauren Osborn) 06/23/2021  ? Liver lesion 12/01/2020  ? Liver disease 12/01/2020  ? Elevated bilirubin 12/01/2020  ? OSA (obstructive sleep apnea) 11/16/2020  ? Acute on chronic heart failure with preserved ejection fraction (HFpEF) (Lauren Osborn) 09/22/2020  ? Morbid obesity with BMI of 50.0-59.9, adult (Lauren Osborn) 09/22/2020  ? Heart failure, unspecified (Lauren Osborn) 09/22/2020  ? Pulmonary edema 09/16/2020  ? Hypokalemia 09/16/2020  ? Prolonged QT interval 09/16/2020  ? Acute  kidney injury (Lauren Osborn) 09/16/2020  ? Elevated brain natriuretic peptide (BNP) level 09/16/2020  ? GERD (gastroesophageal reflux disease) 09/16/2020  ? Anasarca 09/16/2020  ? Super obese 09/16/2020  ? Acute kidney failure, unspecified (Lauren Osborn) 09/16/2020  ? Normocytic anemia 04/23/2020  ? Family history of colon cancer 04/23/2020  ? Mass of upper outer quadrant of right breast 11/04/2019  ? S/P partial thyroidectomy 10/21/2018   ? Visit for routine gyn exam 07/03/2018  ? Fingertip amputation, initial encounter 10/22/2017  ? ? ?Conditions to be addressed/monitored per PCP order:  CHF and ESRD ? ?Care Plan : RN Care Manager Plan of Care  ?Updates made by Lauren Montane, RN since 12/20/2021 12:00 AM  ?Completed 12/20/2021  ? ?Problem: Health Management Needs Related to CHF and CKD Resolved 12/20/2021  ?  ? ?Long-Range Goal: Development of Plan of Care to Address Health Management Needs Related to CHF and CKD Completed 12/20/2021  ?Start Date: 08/23/2021  ?Expected End Date: 11/21/2021  ?Priority: High  ?Note:   ?Current Barriers:  ?Chronic Disease Management support and education needs related to CHF and CKD Stage 3-4 ?Lauren Osborn received resources from [facility] Care Guide. Lauren Osborn's insurance has changed. She no longer has Managed Medicaid, now with Medicaid of [place] and Medicare. She is waiting for Peritoneal dialysis catheter placement and denies any needs at this time. ? ?RNCM Clinical Goal(s):  ?Lauren Osborn will verbalize understanding of plan for management of CHF and CKD Stage 3-4 as evidenced by Lauren Osborn verbalization of self monitoring activities ?take all medications exactly as prescribed and will call provider for medication related questions as evidenced by documentation in EMR    ?attend all scheduled medical appointments: dialysis on M/W/F, 10/18/21 with Ortho, 10/31/21 with [facility] and 11/08/21 with Pulmonology as evidenced by provider documentation in EMR        ?demonstrate improved adherence to prescribed treatment plan for CHF and CKD Stage 3-4 as evidenced by provider documentation in EMR  through collaboration with RN Care manager, provider, and care team.  ? ?Interventions: ?Inter-disciplinary care team collaboration (see longitudinal plan of care) ?Evaluation of current treatment plan related to  self management and Lauren Osborn's adherence to plan as established by provider ?Advised Lauren Osborn to discuss Case Management with her providers should any  needs arise ? ?Heart Failure Interventions:  (Status: Goal on Track (progressing): YES.)  Long Term Goal  ?Provided education on low sodium diet ?Discussed the importance of keeping all appointments with provider ?Assessed social determinant of health barriers ?Collaborated with Care Guide for dental and financial resources, requesting another call to Lauren Osborn ? ?Chronic Kidney Disease (Status: Goal on Track (progressing): YES.)  Long Term Goal  ?Last practice recorded BP readings:  ?BP Readings from Last 3 Encounters:  ?10/12/21 98/69  ?09/20/21 111/86  ?09/19/21 (!) 86/60  ?Most recent eGFR/CrCl: No results found for: EGFR  No components found for: CRCL ? ?Evaluation of current treatment plan related to chronic kidney disease self management and Lauren Osborn's adherence to plan as established by provider      ?Provided education to Lauren Osborn re: stroke prevention, s/s of heart attack and stroke    ?Reviewed prescribed diet Lauren Osborn meets with dietician monthly ?Reviewed medications with Lauren Osborn and discussed importance of compliance    ?Reviewed scheduled/upcoming provider appointments including    ?Discussed plans with Lauren Osborn for ongoing care management follow up and provided Lauren Osborn with direct contact information for care management team    ?Assessed social determinant of health barriers    ?Support coping and  stress management by recognizing current strategies and assist in developing new strategies such as mindfulness, journaling, relaxation techniques, problem-solving    ? ?Lauren Osborn Goals/Self-Care Activities: ?Take medications as prescribed   ?Attend all scheduled provider appointments ?Call pharmacy for medication refills 3-7 days in advance of running out of medications ?Perform all self care activities independently  ?Perform IADL's (shopping, preparing meals, housekeeping, managing finances) independently ?Call provider office for new concerns or questions  ?call office if I gain more than 2 pounds in one day or  5 pounds in one week ?meet with dietitian ?track weight in diary ?use salt in moderation ?watch for swelling in feet, ankles and legs every day ?begin a heart failure diary ?eat more whole grains, fruit

## 2021-12-22 ENCOUNTER — Telehealth: Payer: Self-pay | Admitting: Pulmonary Disease

## 2021-12-22 NOTE — Telephone Encounter (Signed)
Called and spoke to [place], she wanted OV notes from last OV. Confirmed for her that only time patient has been seen in 2023 was in March of this year. She voiced understanding. OV notes faxed to [facility] attn:[place] [phone] ?

## 2021-12-26 ENCOUNTER — Ambulatory Visit: Payer: Medicare Other | Attending: Pulmonary Disease | Admitting: Pulmonary Disease

## 2021-12-26 ENCOUNTER — Ambulatory Visit (INDEPENDENT_AMBULATORY_CARE_PROVIDER_SITE_OTHER): Payer: Medicare Other | Admitting: Physician Assistant

## 2021-12-26 VITALS — BP 74/54 | HR 78 | Temp 97.9°F | Resp 20 | Ht 63.0 in | Wt 206.3 lb

## 2021-12-26 DIAGNOSIS — R0683 Snoring: Secondary | ICD-10-CM | POA: Diagnosis not present

## 2021-12-26 DIAGNOSIS — N186 End stage renal disease: Secondary | ICD-10-CM

## 2021-12-26 DIAGNOSIS — Z992 Dependence on renal dialysis: Secondary | ICD-10-CM

## 2021-12-26 DIAGNOSIS — G4733 Obstructive sleep apnea (adult) (pediatric): Secondary | ICD-10-CM | POA: Diagnosis present

## 2021-12-26 NOTE — Progress Notes (Signed)
? ? ?  Postoperative Access Visit ? ? ?History of Present Illness  ? ?Lauren Osborn is a 46 y.o. year old female who presents for postoperative follow-up for: left  brachiocephalic fistula superficialization  (Date: 11/24/21).  Patient also had PD catheter placement in [place] on 12/22/2021.  Patient states left arm fistula incision has separated and has been draining purulent material.  She has been on 4 days of IV antibiotics which has already improved the appearance of the wound.  She denies fevers, chills, nausea/vomiting.  She denies any bleeding.  She begins education peritoneal dialysis and flushing of catheter this week. ? ?Physical Examination  ? ?Vitals:  ? 12/26/21 1424  ?BP: (!) 74/54  ?Pulse: 78  ?Resp: 20  ?Temp: 97.9 ?F (36.6 ?C)  ?TempSrc: Temporal  ?SpO2: 96%  ?Weight: 206 lb 4.8 oz (93.6 kg)  ?Height: '5\' 3"'$  (1.6 m)  ? ?Body mass index is 36.54 kg/m?. ? ?left arm Palpable thrill near the anastomosis in left arm; distal arm incision with separation and purulent drainage with no fluctuance or pocket of fluid collection; proximalmost incision well-healed  ? ? ?Medical Decision Making  ? ?Lauren Osborn is a 46 y.o. year old female who presents s/p left  revision of brachiocephalic fistula by superficialization ? ?Patent fistula without signs or symptoms of steal syndrome ?All purulent material was expressed through the small opening of the incision.  Wound was irrigated.  There is no further fluid collection.  Continue IV antibiotics.  We discussed twice daily cleansing of wound with antibacterial soap and water.  She will pat the wound dry and leave open to air.  Given that this is a fistula with no foreign material, this will likely heal without surgical intervention.  Patient can follow-up on an as-needed basis.  She will call/return office if wound fails to improve. ?Left arm fistula is ready for use once incision is completely healed ? ? ?Dagoberto Ligas PA-C ?[facility] ?Office: [phone] ? ?Clinic MD: Trula Slade ? ?

## 2022-01-02 ENCOUNTER — Other Ambulatory Visit: Payer: Self-pay

## 2022-01-02 DIAGNOSIS — G4733 Obstructive sleep apnea (adult) (pediatric): Secondary | ICD-10-CM | POA: Diagnosis not present

## 2022-01-02 DIAGNOSIS — G2581 Restless legs syndrome: Secondary | ICD-10-CM

## 2022-01-02 NOTE — Procedures (Signed)
Patient Name: Lauren Osborn, Lauren Osborn ?Study Date: 12/26/2021 ?Gender: Female ?D.O.B: [birth year] ?Age (years): 58 ?Referring Provider: Kara Mead MD, ABSM ?Height (inches): 63 ?Interpreting Physician: Kara Mead MD, ABSM ?Weight (lbs): 206 ?RPSGT: Rosebud Poles ?BMI: 36 ?MRN: [id number] ?Neck Size: 14.00 ?<br> <br> ?CLINICAL INFORMATION ?Sleep Study Type: NPSG ? ? ? ?Indication for sleep study: reassess OSA after weight loss ?12/2020 NPSG >> 294 lbs,  severe OSA, AHI 39/h ? ? ? ?Epworth Sleepiness Score: 7 ? ? ? ?SLEEP STUDY TECHNIQUE ?As per the AASM Manual for the Scoring of Sleep and Associated Events v2.3 (April 2016) with a hypopnea requiring 4% desaturations. ? ?The channels recorded and monitored were frontal, central and occipital EEG, electrooculogram (EOG), submentalis EMG (chin), nasal and oral airflow, thoracic and abdominal wall motion, anterior tibialis EMG, snore microphone, electrocardiogram, and pulse oximetry. ? ?MEDICATIONS ?Medications self-administered by patient taken the night of the study : N/A ? ?SLEEP ARCHITECTURE ?The study was initiated at 10:41:12 PM and ended at 5:18:46 AM. ? ?Sleep onset time was 21.8 minutes and the sleep efficiency was 87.2%. The total sleep time was 346.8 minutes. ? ?Stage REM latency was 129.0 minutes. ? ?The patient spent 10.67% of the night in stage N1 sleep, 80.54% in stage N2 sleep, 1.87% in stage N3 and 6.9% in REM. ? ?Alpha intrusion was absent. ? ?Supine sleep was 52.19%. ? ?RESPIRATORY PARAMETERS ?The overall apnea/hypopnea index (AHI) was 0.5 per hour. There were 0 total apneas, including 0 obstructive, 0 central and 0 mixed apneas. There were 3 hypopneas and 0 RERAs. ? ?The AHI during Stage REM sleep was 5.0 per hour. ? ?AHI while supine was 0.0 per hour. ? ?The mean oxygen saturation was 96.63%. The minimum SpO2 during sleep was 88.00%. ? ?moderate snoring was noted during this study. ? ?CARDIAC DATA ?The 2 lead EKG demonstrated sinus rhythm. The mean heart rate  was 77.40 beats per minute. Other EKG findings include: PVCs. ? ? ?LEG MOVEMENT DATA ?The total PLMS were 467 with a resulting PLMS index of 80.79. Associated arousal with leg movement index was 11.6 . ? ?IMPRESSIONS ?- No significant obstructive sleep apnea occurred during this study (AHI = 0.5/h). ?- The patient had minimal or no oxygen desaturation during the study (Min O2 = 88.00%) ?- The patient snored with moderate snoring volume. ?- EKG findings include PVCs. ?- Severe periodic limb movements of sleep occurred during the study. Associated arousals were significant. ? ? ?DIAGNOSIS ?- OSA has resolved with weight loss ?- Severe PLMs ? ? ?RECOMMENDATIONS ?- Please corelate clinically for restless legs syndrome, consider iron studies and treatment. ?- Avoid alcohol, sedatives and other CNS depressants that may worsen sleep apnea and disrupt normal sleep architecture. ?- Sleep hygiene should be reviewed to assess factors that may improve sleep quality. ?- Weight management and regular exercise should be initiated or continued if appropriate. ? ? ? ?Kara Mead MD ?Board Certified in Sleep medicine ? ?

## 2022-01-05 ENCOUNTER — Ambulatory Visit
Admission: RE | Admit: 2022-01-05 | Discharge: 2022-01-05 | Disposition: A | Discharge status: Home / Self Care | Payer: Medicare Other | Source: Ambulatory Visit | Attending: Physician Assistant | Admitting: Physician Assistant

## 2022-01-05 DIAGNOSIS — R490 Dysphonia: Secondary | ICD-10-CM

## 2022-01-05 DIAGNOSIS — J3801 Paralysis of vocal cords and larynx, unilateral: Secondary | ICD-10-CM

## 2022-01-05 IMAGING — CT CT NECK W/ CM
3 of 4 series · 6 of 14 positions shown, 7 images · IV contrast (agent unspecified)
Comparison: None Available.

CLINICAL DATA: Left vocal cord paralysis.  History of thyroidectomy

EXAM:
CT NECK WITH CONTRAST
TECHNIQUE: Multidetector CT imaging of the neck was performed using the
standard protocol following the bolus administration of intravenous
contrast.

[Series 3: neck · axial · 0.58mm/px · z∈[+789,+867]mm · 2 of 118 slices shown]
[im 40/118  bone]
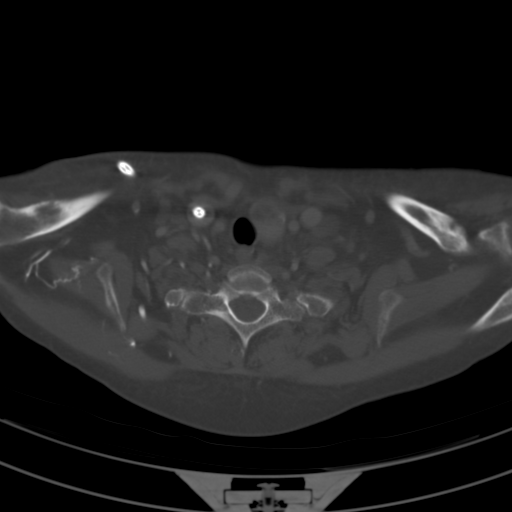
[im 79/118  bone]
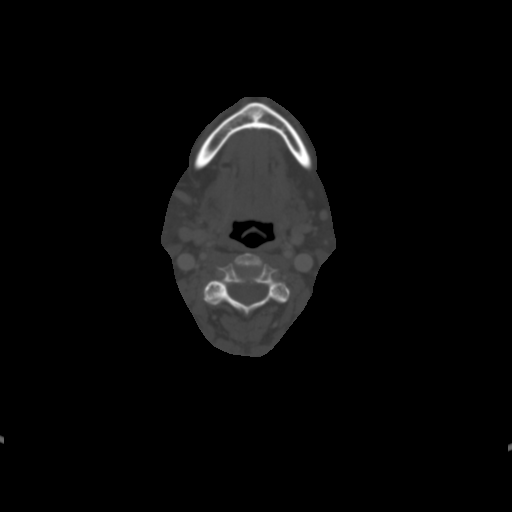

[Series 8: angled axial-oropharynx · axial · 0.39mm/px · z∈[+790,+868]mm · 2 of 118 slices shown, 3 images]
[im 40/118  soft-tissue]
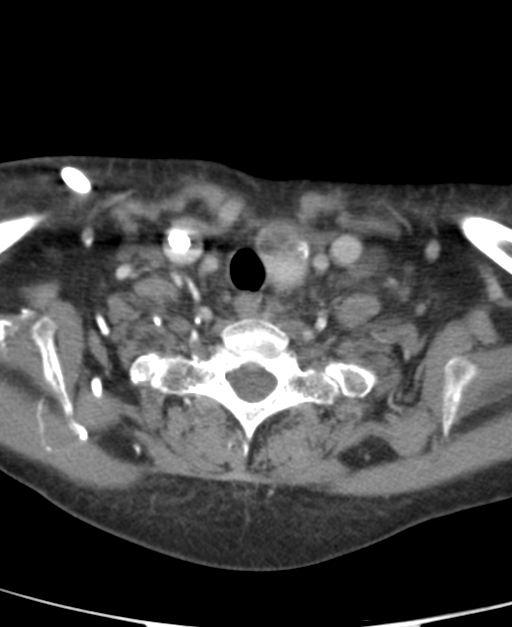
[im 40/118  bone]
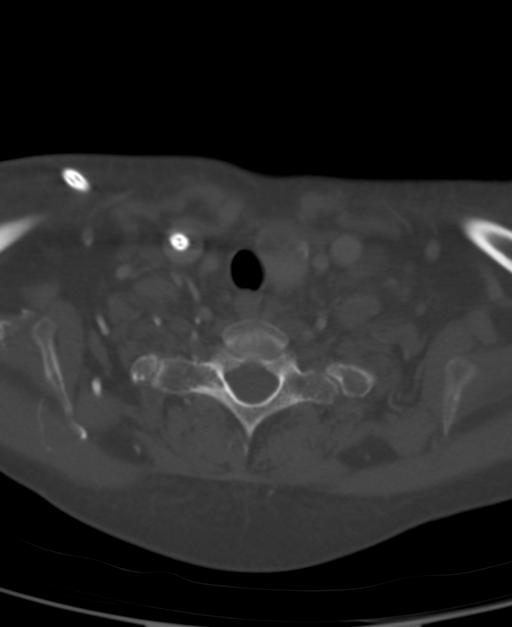
[im 79/118  bone]
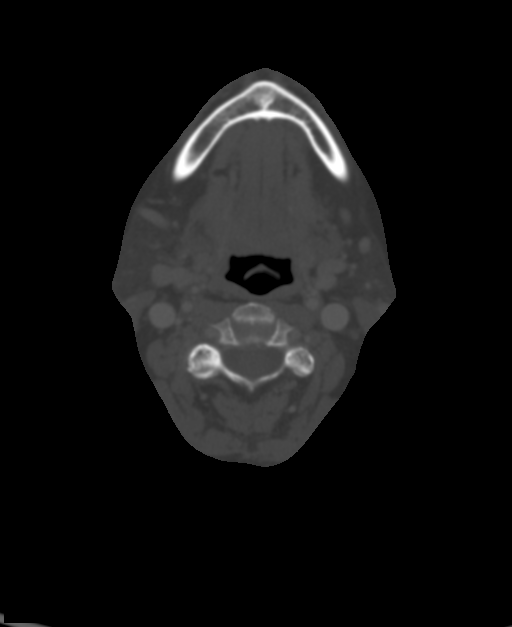

[Series 9: angled (person_name) · axial · 0.39mm/px · z∈[+790,+868]mm · 2 of 118 slices shown]
[im 40/118  bone]
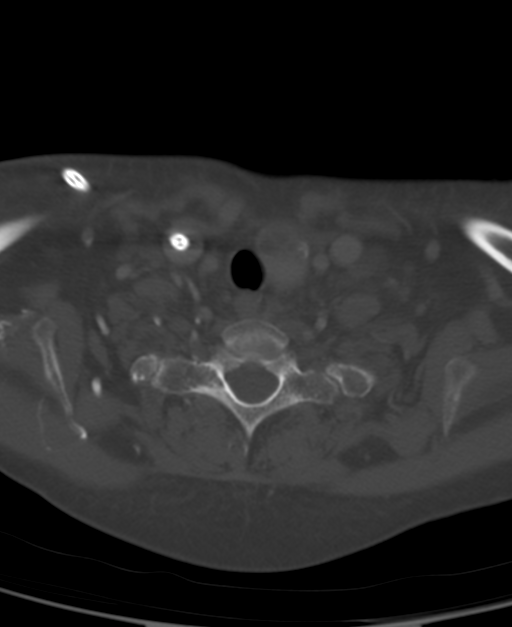
[im 79/118  bone]
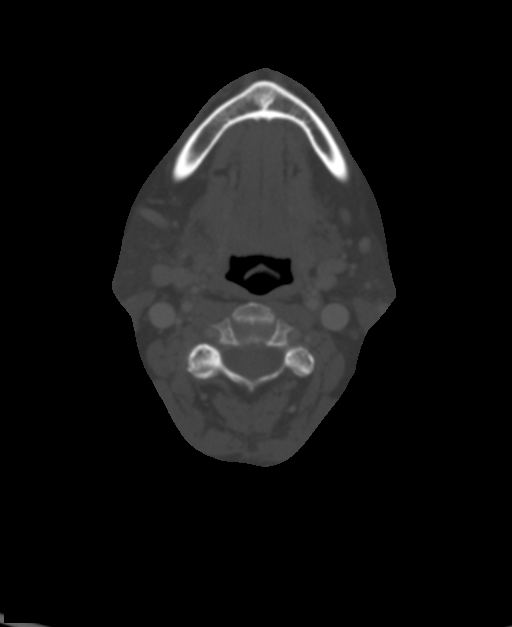

[6 of 14 positions shown; findings below may reference images not displayed]

RADIATION DOSE REDUCTION: This exam was performed according to the
departmental dose-optimization program which includes automated
exposure control, adjustment of the mA and/or kV according to
patient size and/or use of iterative reconstruction technique.

CONTRAST:  75mL [AR] IOPAMIDOL ([AR]) INJECTION 61%
FINDINGS: Pharynx and larynx: The nasal cavity and nasopharynx are
unremarkable.

Oral cavity and oropharynx are unremarkable. The parapharyngeal
spaces are clear.

There is widening of the left piriform sinus with medialization of
the left vocal fold consistent with the history of left vocal fold
paralysis. The right vocal fold is normal. There is no abnormal
enhancing lesion.

Salivary glands: The parotid and submandibular glands are
unremarkable.

Thyroid: The right thyroid lobe is surgically absent. The remaining
left thyroid lobe is diffusely heterogeneous with multiple nodules
measuring up to 1.4 cm. This has previously been evaluated by
ultrasound.

Lymph nodes: There are scattered prominent left cervical and
supraclavicular lymph nodes measuring up to 1.0 cm in short axis,
and a 1.0 cm prevascular lymph node in the mediastinum, nonspecific.

Vascular: A tunneled right IJ vascular catheter is in place. There
is a non-occlusive pericatheter thrombus (favored) versus mixing
artifact involving the mid aspect of the HD catheter within the
right brachiocephalic vein without associated hypertrophied venous
collaterals and thus of doubtful/no clinical significance. The major
vasculature of the neck is otherwise unremarkable.

Limited intracranial: The imaged portions of the intracranial
compartment are unremarkable.

Visualized orbits: Not included within the field of view.

Mastoids and visualized paranasal sinuses: There is mild mucosal
thickening in the maxillary sinuses. The imaged mastoid air cells
are clear.

Skeleton: There is no acute osseous abnormality or suspicious
osseous lesion.

Upper chest: There is a right pleural effusion, incompletely imaged.

Other: There is no suspicious lymph node or mass lesion.
Specifically, no abnormality is identified along the course of the
recurrent left laryngeal nerve.
IMPRESSION: 1. Findings consistent with left vocal fold paralysis. No abnormal
soft tissue mass or other causative finding is seen along the course
of the recurrent laryngeal nerve.
2. Status post right thyroidectomy. The remaining left thyroid lobe
is heterogeneous with multiple nodules measuring up to 1.4 cm. These
were previously evaluated by ultrasound in [[place]], but given findings
on the prior ultrasound consider repeat ultrasound to re-evaluate
the remaining nodules.
3. Incompletely imaged right pleural effusion.
4. Scattered left cervical and supraclavicular lymph nodes measuring
up to 1.0 cm in short axis, nonspecific and not technically
pathologically enlarged by size criteria.

## 2022-01-05 MED ORDER — IOPAMIDOL (ISOVUE-300) INJECTION 61%
75.0000 mL | Freq: Once | INTRAVENOUS | Status: AC | PRN
  Administered 2022-01-05: 75 mL via INTRAVENOUS

## 2022-01-06 LAB — IRON,TIBC AND FERRITIN PANEL
Ferritin: 170 ng/mL — ABNORMAL HIGH (ref 15–150)
Iron Saturation: 39 % (ref 15–55)
Iron: 79 ug/dL (ref 27–159)
Total Iron Binding Capacity: 205 ug/dL — ABNORMAL LOW (ref 250–450)
UIBC: 126 ug/dL — ABNORMAL LOW (ref 131–425)

## 2022-01-18 ENCOUNTER — Other Ambulatory Visit: Payer: Self-pay | Admitting: Otolaryngology

## 2022-01-18 DIAGNOSIS — J3801 Paralysis of vocal cords and larynx, unilateral: Secondary | ICD-10-CM

## 2022-01-18 DIAGNOSIS — E041 Nontoxic single thyroid nodule: Secondary | ICD-10-CM

## 2022-01-19 ENCOUNTER — Ambulatory Visit
Admission: RE | Admit: 2022-01-19 | Discharge: 2022-01-19 | Disposition: A | Discharge status: Home / Self Care | Payer: Medicare Other | Source: Ambulatory Visit | Attending: Otolaryngology | Admitting: Otolaryngology

## 2022-01-19 DIAGNOSIS — E041 Nontoxic single thyroid nodule: Secondary | ICD-10-CM

## 2022-01-19 DIAGNOSIS — J3801 Paralysis of vocal cords and larynx, unilateral: Secondary | ICD-10-CM

## 2022-01-19 IMAGING — US US THYROID
1 series · 13 of 25 positions shown · non-contrast
Comparison: CT scan of the neck [DATE]; remote prior thyroid
ultrasound [DATE]

CLINICAL DATA: Incidental on CT.

EXAM:
THYROID ULTRASOUND
TECHNIQUE: Ultrasound examination of the thyroid gland and adjacent soft
tissues was performed.

[Series 1: us thyroid · 0.06mm/px · 13 of 66 slices shown]
[im 1/66]
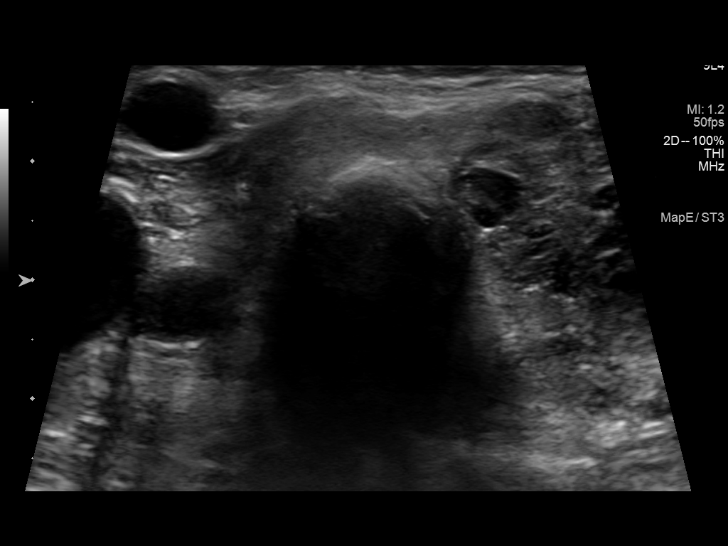
[im 6/66]
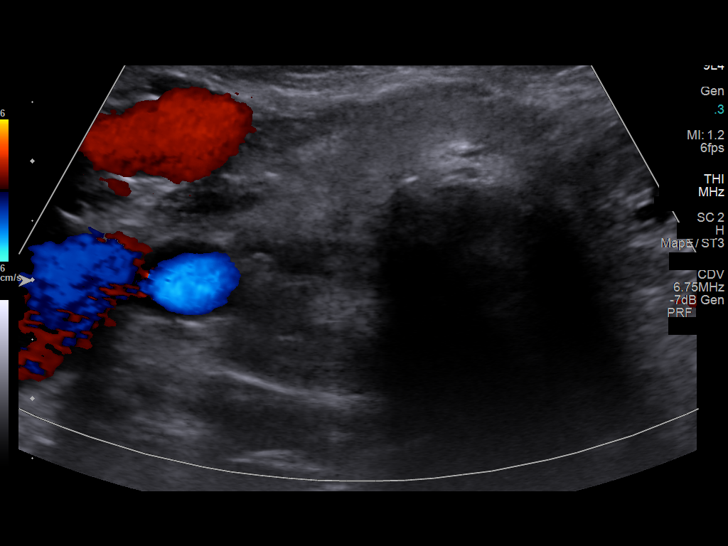
[im 11/66]
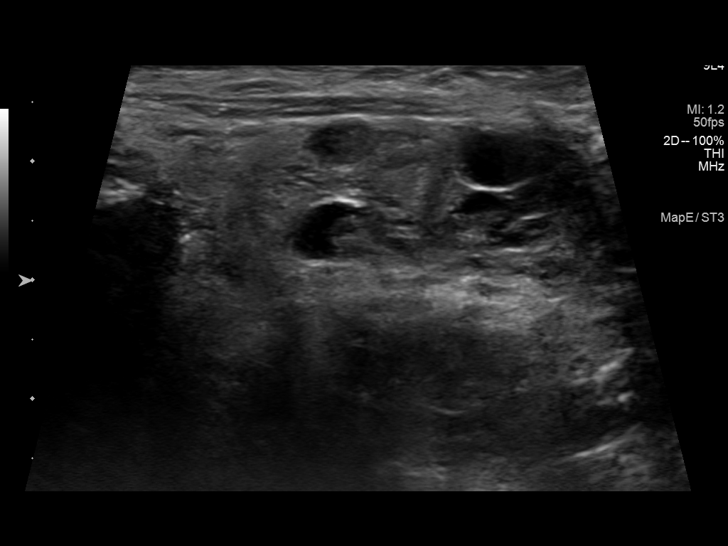
[im 17/66]
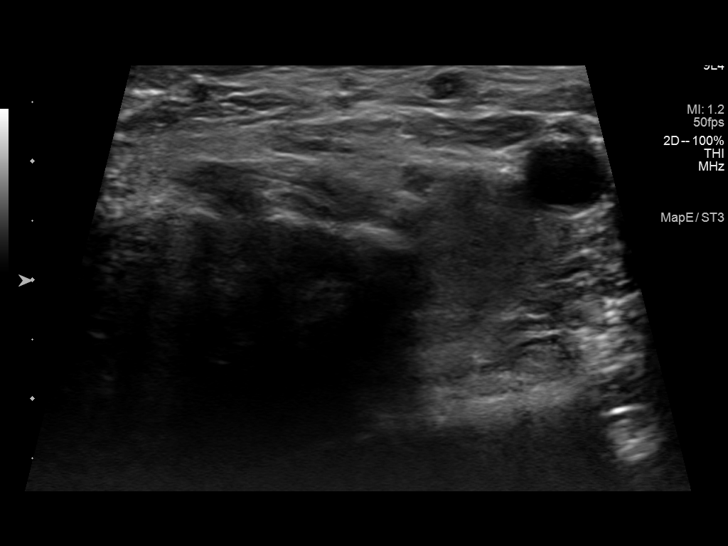
[im 22/66]
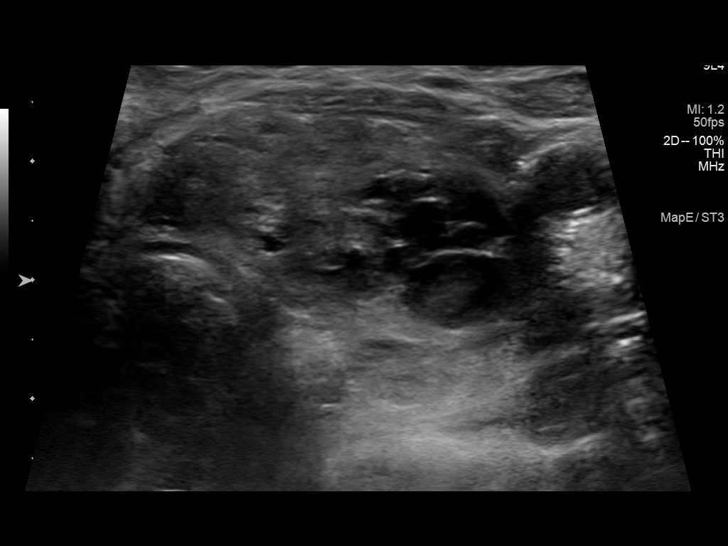
[im 28/66]
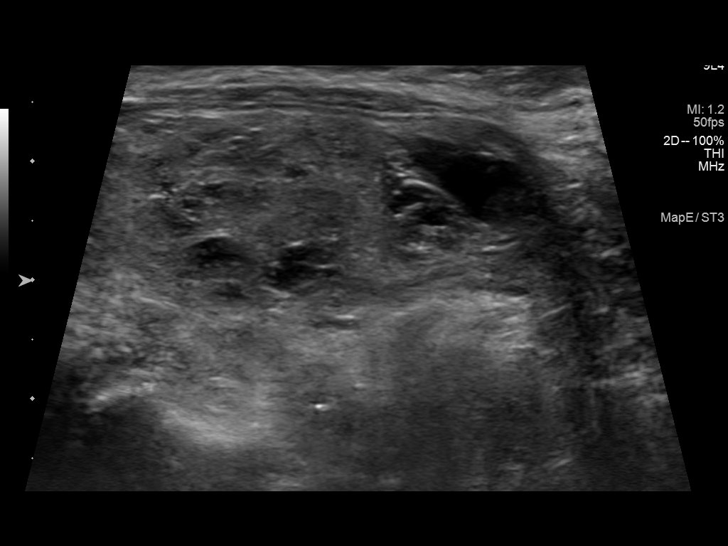
[im 33/66]
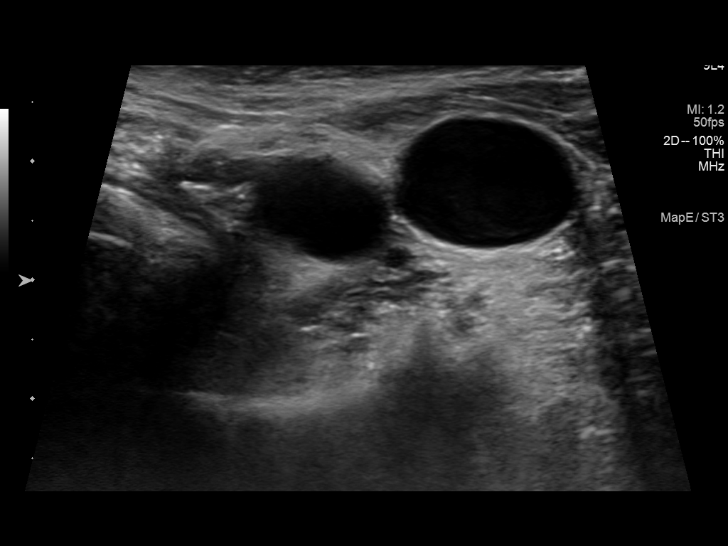
[im 38/66]
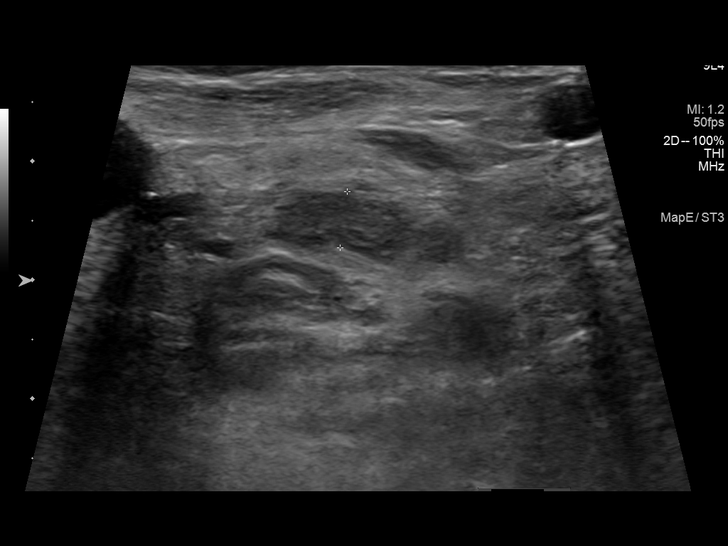
[im 44/66]
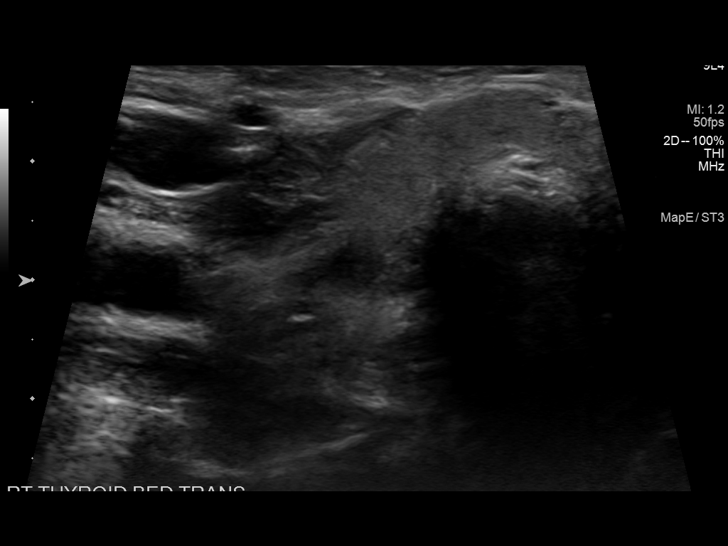
[im 49/66]
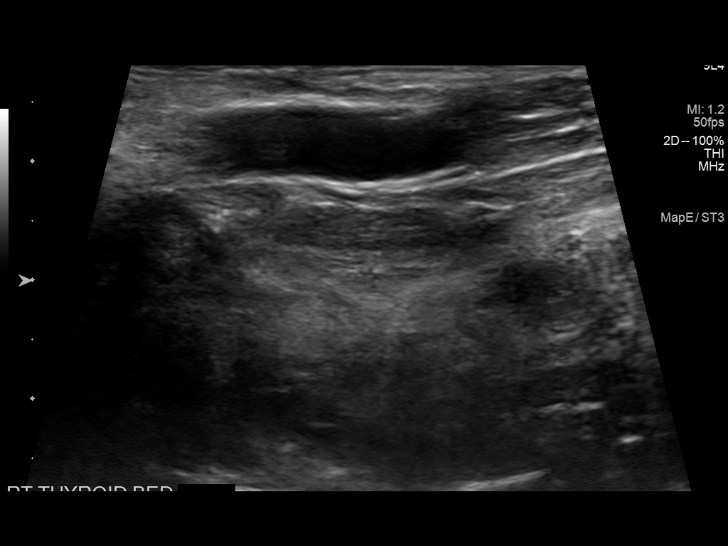
[im 55/66]
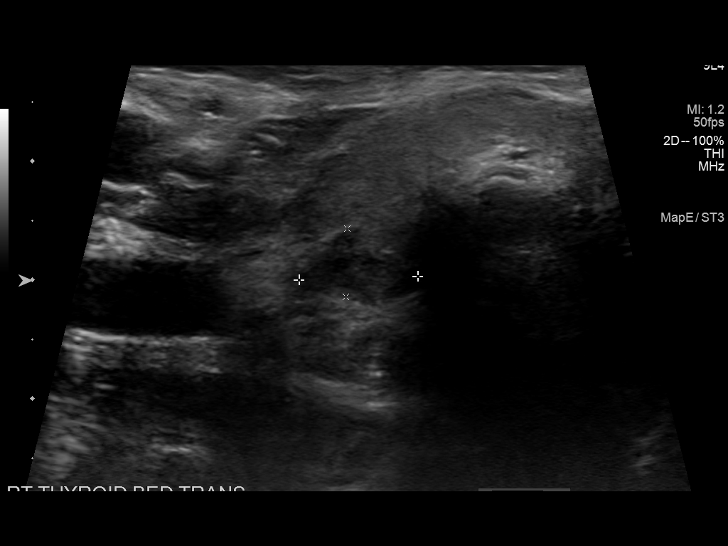
[im 60/66]
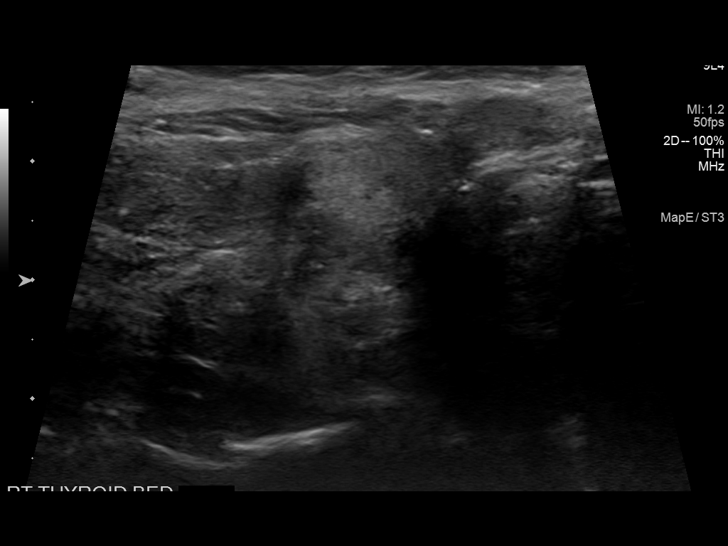
[im 66/66]
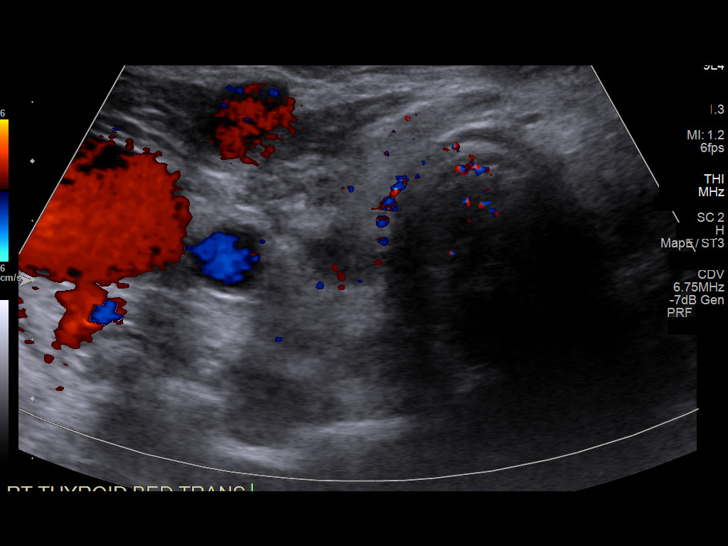

[13 of 25 positions shown; findings below may reference images not displayed]

FINDINGS: Parenchymal Echotexture: Mildly heterogenous

Isthmus: 0.5 cm

Right lobe: 1.4 x 0.8 x 0.8 cm

Left lobe: 5.9 x 2.3 x 2.6 cm

_________________________________________________________

Estimated total number of nodules >/= 1 cm: 1

Number of spongiform nodules >/=  2 cm not described below (TR1): 0

Number of mixed cystic and solid nodules >/= 1.5 cm not described
below (TR2): 0

_________________________________________________________

Small/atrophied right thyroid lobe.

Nodule # 1:

Location: Left; Mid

Maximum size: 3.9 cm; Other 2 dimensions: 3.3 x 1.8 cm

Composition: solid/almost completely solid (2)

Echogenicity: isoechoic (1)

Shape: not taller-than-wide (0)

Margins: ill-defined (0)

Echogenic foci: none (0)

ACR TI-RADS total points: 3.

ACR TI-RADS risk category: TR3 (3 points).

ACR TI-RADS recommendations:

**Given size (>/= 2.5 cm) and appearance, fine needle aspiration of
this mildly suspicious nodule should be considered based on TI-RADS
criteria.

_________________________________________________________
IMPRESSION: Large 3.9 cm TI-RADS category 3 nodule occupying the majority of the
left gland. This lesion meets criteria to consider fine-needle
aspiration biopsy.

Atretic right thyroid lobe. Reportedly, patient previously underwent
right thyroid lobectomy. This may represent a small amount of
residual/recurrent thyroid tissue.

The above is in keeping with the ACR TI-RADS recommendations - [HOSPITAL] [2W];[DATE].

## 2022-01-21 ENCOUNTER — Other Ambulatory Visit: Payer: Self-pay | Admitting: Student

## 2022-02-01 ENCOUNTER — Ambulatory Visit (INDEPENDENT_AMBULATORY_CARE_PROVIDER_SITE_OTHER): Payer: Medicare Other | Admitting: Internal Medicine

## 2022-02-01 ENCOUNTER — Encounter: Payer: Self-pay | Admitting: Internal Medicine

## 2022-02-01 ENCOUNTER — Encounter: Payer: Self-pay | Admitting: *Deleted

## 2022-02-01 VITALS — BP 83/58 | HR 96 | Temp 97.9°F | Ht 63.0 in | Wt 207.0 lb

## 2022-02-01 DIAGNOSIS — K7460 Unspecified cirrhosis of liver: Secondary | ICD-10-CM

## 2022-02-01 DIAGNOSIS — D649 Anemia, unspecified: Secondary | ICD-10-CM

## 2022-02-01 DIAGNOSIS — K769 Liver disease, unspecified: Secondary | ICD-10-CM | POA: Diagnosis not present

## 2022-02-01 DIAGNOSIS — R188 Other ascites: Secondary | ICD-10-CM

## 2022-02-01 DIAGNOSIS — K2100 Gastro-esophageal reflux disease with esophagitis, without bleeding: Secondary | ICD-10-CM

## 2022-02-01 NOTE — Progress Notes (Signed)
Referring Provider: Carrolyn Meiers* Primary Care Physician:  Carrolyn Meiers, MD Primary GI:  Dr. Abbey Chatters  Chief Complaint  Patient presents with   Cirrhosis    Follow up on Cirrhosis and GERD. States pantoprazole has been controlling symptoms. Has concerns about retaining fluid.     HPI:   Lauren Osborn is a 46 y.o. female who presents to the clinic today for follow-up visit.  She has a complicated past medical history.  History of chronic diastolic congestive heart failure with RV dysfunction admitted to [facility] 07/27/2019 to 08/20/21 for significant volume overload, unresponsive to diuretics eventually requiring dialysis, lost over 100 pounds of fluid weight, dialysis complicated by hypotension, started on midodrine.  She had an ultrasound done 07/22/2021 which showed cholelithiasis with mild gallbladder wall thickening as well as hepatic cirrhosis and moderate volume ascites.  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  In regards to her cirrhosis, full serological testing unremarkable.  Does have significant fatty liver disease history.  No heavy alcohol use.  No family history of liver disease that she is aware of.  Today she states she is doing well.  Denies any dyspnea or chest pain.  Continues to have lower extremity edema.  Has been transitioned to peritoneal dialysis.   History of normocytic anemia.  Underwent GI evaluation previously for this: Colonoscopy: 5 mm polyp in the sigmoid colon, nonbleeding internal hemorrhoids, fair prep.  Recommended repeat colonoscopy in 5 years.  Pathology with tubular adenoma. EGD: LA grade C esophagitis without bleeding, gastritis characterized by erythema and shallow ulcerations in gastric antrum biopsied, normal examined duodenum biopsied.  Gastric biopsy negative for H. pylori, duodenal biopsy benign.  Recommended PPI twice daily.  Consider capsule  study if anemia does not improve with iron supplementation and treatment of gastritis/ulcers.   Past Medical History:  Diagnosis Date   Abnormal bilirubin test    Cholelithiasis    Chronic kidney disease, stage 3b (HCC)    Diastolic congestive heart failure (HCC)    Esophagitis    Essential hypertension    Fibroids    Gastritis    GERD (gastroesophageal reflux disease)    Gout    Liver hemangioma    Morbid obesity ([place])    Normocytic anemia    OSA (obstructive sleep apnea)    Thyroid nodule    Tubular adenoma     Past Surgical History:  Procedure Laterality Date   AV FISTULA PLACEMENT Left 08/19/2021   Procedure: LEFT ARM ARTERIOVENOUS (AV) FISTULA;  Surgeon: Serafina Mitchell, MD;  Location: [place];  Service: Vascular;  Laterality: Left;   [place] Left 11/24/2021   Procedure: LEFT SECOND STAGE [place];  Surgeon: Serafina Mitchell, MD;  Location: [place];  Service: Vascular;  Laterality: Left;   BIOPSY  08/03/2020   Procedure: BIOPSY;  Surgeon: Eloise Harman, DO;  [facility];  Service: Endoscopy;;  duodenal gastric   CESAREAN SECTION     COLONOSCOPY WITH PROPOFOL N/A 08/03/2020    Surgeon: Hurshel Keys K, DO; 5 mm tubular adenoma in the sigmoid colon, nonbleeding internal hemorrhoids.  Recommended repeat in 5 years.   ESOPHAGOGASTRODUODENOSCOPY (EGD) WITH PROPOFOL N/A 08/03/2020   Surgeon: Eloise Harman, DO; LA grade C esophagitis without bleeding, gastritis with erythema and shallow ulcerations in gastric antrum biopsied (negative for H. pylori), normal examined duodenum s/p biopsy (benign).   HYSTERECTOMY ABDOMINAL  WITH SALPINGECTOMY  2008   IR FLUORO GUIDE CV LINE RIGHT  08/15/2021   IR US GUIDE VASC ACCESS RIGHT  08/15/2021   POLYPECTOMY  08/03/2020   Procedure: POLYPECTOMY;  Surgeon: Eloise Harman, DO;  [facility];  Service: Endoscopy;;  colon   RIGHT HEART CATH N/A 07/26/2021   Procedure:  RIGHT HEART CATH;  Surgeon: Larey Dresser, MD;  Location: [place] [facility];  Service: Cardiovascular;  Laterality: N/A;   THYROIDECTOMY Right 10/21/2018   Procedure: RIGHT HEMITHYROIDECTOMY;  Surgeon: Leta Baptist, MD;  Location: [place];  Service: ENT;  Laterality: Right;    Current Outpatient Medications  Medication Sig Dispense Refill   acetaminophen (TYLENOL) 500 MG tablet Take 1,000 mg by mouth every 6 (six) hours as needed for moderate pain or headache.     allopurinol (ZYLOPRIM) 100 MG tablet Take 1 tablet (100 mg total) by mouth 3 (three) times a week. (Patient taking differently: Take 100 mg by mouth every Monday, Wednesday, and Friday.) 36 tablet 3   furosemide (LASIX) 80 MG tablet Take 80 mg by mouth daily.     midodrine (PROAMATINE) 5 MG tablet TAKE 3 TABLETS BY MOUTH 3 TIMES A DAY WITH MEALS 270 tablet 2   pantoprazole (PROTONIX) 40 MG tablet TAKE 1 TABLET BY MOUTH EVERY DAY 90 tablet 1   TART CHERRY PO Take 1 tablet by mouth 3 (three) times a week. Tuesdays, Saturdays & Sundays in the morning.     traMADol (ULTRAM) 50 MG tablet Take 1 tablet (50 mg total) by mouth every 6 (six) hours as needed. (Patient not taking: Reported on 02/01/2022) 20 tablet 0   No current facility-administered medications for this visit.    Allergies as of 02/01/2022 - Review Complete 02/01/2022  Allergen Reaction Noted   Oxycodone-acetaminophen Hives and Other (See Comments) 10/11/2018    Family History  Problem Relation Age of Onset   Breast cancer Mother    Other Paternal Grandfather        house fire   Other Maternal Grandmother        house fire   Congestive Heart Failure Maternal Grandfather    Colon cancer Father 59   Diverticulitis Brother    Diverticulitis Sister    Colon polyps Sister 51   Diabetes Daughter        borderline    Social History   Socioeconomic History   Marital status: Single    Spouse name: Not on file   Number of children: Not on file    Years of education: Not on file   Highest education level: Not on file  Occupational History   Not on file  Tobacco Use   Smoking status: Former    Packs/day: 0.50    Years: 20.00    Pack years: 10.00    Types: Cigarettes    Quit date: 2017    Years since quitting: 6.4    Passive exposure: Never   Smokeless tobacco: Never  Vaping Use   Vaping Use: Never used  Substance and Sexual Activity   Alcohol use: Not Currently   Drug use: Never   Sexual activity: Yes    Comment: hyst  Other Topics Concern   Not on file  Social History Narrative   Not on file   Social Determinants of Health   Financial Resource Strain: High Risk   Difficulty of Paying Living Expenses: Hard  Food Insecurity: No Food Insecurity   Worried About Running  Out of Food in the Last Year: Never true   Ran Out of Food in the Last Year: Never true  Transportation Needs: No Transportation Needs   Lack of Transportation (Medical): No   Lack of Transportation (Non-Medical): No  Physical Activity: Not on file  Stress: Not on file  Social Connections: Not on file    Subjective: Review of Systems  Constitutional:  Negative for chills and fever.  HENT:  Negative for congestion and hearing loss.        Hoarseness  Eyes:  Negative for blurred vision and double vision.  Respiratory:  Negative for cough and shortness of breath.   Cardiovascular:  Positive for leg swelling. Negative for chest pain and palpitations.  Gastrointestinal:  Negative for abdominal pain, blood in stool, constipation, diarrhea, heartburn, melena and vomiting.  Genitourinary:  Negative for dysuria and urgency.  Musculoskeletal:  Negative for joint pain and myalgias.  Skin:  Negative for itching and rash.  Neurological:  Negative for dizziness and headaches.  Psychiatric/Behavioral:  Negative for depression. The patient is not nervous/anxious.     Objective: BP (!) 83/58 (BP Location: Right Arm, Patient Position: Sitting, Cuff Size:  Large)   Pulse 96   Temp 97.9 F (36.6 C)   Ht '5\' 3"'$  (1.6 m)   Wt 207 lb (93.9 kg)   BMI 36.67 kg/m  Physical Exam Constitutional:      Appearance: Normal appearance.  HENT:     Head: Normocephalic and atraumatic.  Eyes:     Extraocular Movements: Extraocular movements intact.     Conjunctiva/sclera: Conjunctivae normal.  Cardiovascular:     Rate and Rhythm: Normal rate and regular rhythm.  Pulmonary:     Effort: Pulmonary effort is normal.     Breath sounds: Normal breath sounds.  Abdominal:     General: Bowel sounds are normal.     Palpations: Abdomen is soft.  Musculoskeletal:        General: No swelling. Normal range of motion.     Cervical back: Normal range of motion and neck supple.     Right lower leg: Edema present.     Left lower leg: Edema present.  Skin:    General: Skin is warm and dry.     Coloration: Skin is not jaundiced.  Neurological:     General: No focal deficit present.     Mental Status: She is alert and oriented to person, place, and time.  Psychiatric:        Mood and Affect: Mood normal.        Behavior: Behavior normal.     Assessment: *Cirrhosis-likely consequence of her cardiac dysfunction. *Hypervolemia-multifactorial with CHF, ESRD, cirrhosis *Chronic GERD-well-controlled on daily pantoprazole *Normocytic anemia-stable  Plan: Discussed cirrhosis in depth with patient today.  Likely a consequence of her cardiac dysfunction (cardiac cirrhosis) vs NAFLD.  Full serological work-up previously negative.  MELD 25 mainly driven by her end-stage renal disease.  EGD 08/03/2020 without evidence of esophageal varices.  Repeat 2024.  Ultrasound due today for [place] screening.  Will order.  No history of hepatic encephalopathy.  Continue to monitor.  We will defer fluid management to her nephrologist.  Patient currently on peritoneal dialysis.  Patient states she is starting the process to get worked up for kidney transplant.  Could be  considered for liver kidney, though unsure if her cardiac dysfunction would preclude this.  Continue on pantoprazole for chronic GERD which is well controlled.  Follow-up with GI in 6 months  02/01/2022 9:55 AM   Disclaimer: This note was dictated with voice recognition software. Similar sounding words can inadvertently be transcribed and may not be corrected upon review.

## 2022-02-01 NOTE — Patient Instructions (Addendum)
We will schedule you for ultrasound with elastography today for liver cancer screening.  We will need to do ultrasound every 6 months in this regard.  We will plan on repeat EGD next year.  Continue to follow-up with your nephrologist in regards to chronic kidney disease.  Hopefully they can titrate your diuretics further to reduce swelling in your legs.  Otherwise follow-up with me in 6 months.  It was very nice seeing you again today.  Dr. Abbey Chatters

## 2022-02-02 DIAGNOSIS — N186 End stage renal disease: Secondary | ICD-10-CM | POA: Diagnosis not present

## 2022-02-02 DIAGNOSIS — Z992 Dependence on renal dialysis: Secondary | ICD-10-CM | POA: Diagnosis not present

## 2022-02-03 DIAGNOSIS — Z452 Encounter for adjustment and management of vascular access device: Secondary | ICD-10-CM | POA: Diagnosis not present

## 2022-02-03 DIAGNOSIS — N186 End stage renal disease: Secondary | ICD-10-CM | POA: Diagnosis not present

## 2022-02-03 DIAGNOSIS — Z992 Dependence on renal dialysis: Secondary | ICD-10-CM | POA: Diagnosis not present

## 2022-02-04 DIAGNOSIS — Z992 Dependence on renal dialysis: Secondary | ICD-10-CM | POA: Diagnosis not present

## 2022-02-04 DIAGNOSIS — N186 End stage renal disease: Secondary | ICD-10-CM | POA: Diagnosis not present

## 2022-02-05 DIAGNOSIS — N186 End stage renal disease: Secondary | ICD-10-CM | POA: Diagnosis not present

## 2022-02-05 DIAGNOSIS — Z992 Dependence on renal dialysis: Secondary | ICD-10-CM | POA: Diagnosis not present

## 2022-02-06 DIAGNOSIS — Z992 Dependence on renal dialysis: Secondary | ICD-10-CM | POA: Diagnosis not present

## 2022-02-06 DIAGNOSIS — N186 End stage renal disease: Secondary | ICD-10-CM | POA: Diagnosis not present

## 2022-02-07 DIAGNOSIS — N186 End stage renal disease: Secondary | ICD-10-CM | POA: Diagnosis not present

## 2022-02-07 DIAGNOSIS — Z992 Dependence on renal dialysis: Secondary | ICD-10-CM | POA: Diagnosis not present

## 2022-02-08 DIAGNOSIS — N186 End stage renal disease: Secondary | ICD-10-CM | POA: Diagnosis not present

## 2022-02-08 DIAGNOSIS — Z992 Dependence on renal dialysis: Secondary | ICD-10-CM | POA: Diagnosis not present

## 2022-02-09 DIAGNOSIS — N186 End stage renal disease: Secondary | ICD-10-CM | POA: Diagnosis not present

## 2022-02-09 DIAGNOSIS — Z992 Dependence on renal dialysis: Secondary | ICD-10-CM | POA: Diagnosis not present

## 2022-02-10 DIAGNOSIS — Z992 Dependence on renal dialysis: Secondary | ICD-10-CM | POA: Diagnosis not present

## 2022-02-10 DIAGNOSIS — N186 End stage renal disease: Secondary | ICD-10-CM | POA: Diagnosis not present

## 2022-02-11 DIAGNOSIS — N186 End stage renal disease: Secondary | ICD-10-CM | POA: Diagnosis not present

## 2022-02-11 DIAGNOSIS — Z992 Dependence on renal dialysis: Secondary | ICD-10-CM | POA: Diagnosis not present

## 2022-02-12 DIAGNOSIS — N186 End stage renal disease: Secondary | ICD-10-CM | POA: Diagnosis not present

## 2022-02-12 DIAGNOSIS — Z992 Dependence on renal dialysis: Secondary | ICD-10-CM | POA: Diagnosis not present

## 2022-02-13 ENCOUNTER — Ambulatory Visit (HOSPITAL_COMMUNITY)
Admission: RE | Admit: 2022-02-13 | Discharge: 2022-02-13 | Disposition: A | Discharge status: Home / Self Care | Payer: Medicare HMO | Source: Ambulatory Visit | Attending: Internal Medicine | Admitting: Internal Medicine

## 2022-02-13 DIAGNOSIS — K769 Liver disease, unspecified: Secondary | ICD-10-CM | POA: Insufficient documentation

## 2022-02-13 DIAGNOSIS — N186 End stage renal disease: Secondary | ICD-10-CM | POA: Diagnosis not present

## 2022-02-13 DIAGNOSIS — K7460 Unspecified cirrhosis of liver: Secondary | ICD-10-CM | POA: Diagnosis not present

## 2022-02-13 DIAGNOSIS — K8020 Calculus of gallbladder without cholecystitis without obstruction: Secondary | ICD-10-CM | POA: Diagnosis not present

## 2022-02-13 DIAGNOSIS — Z992 Dependence on renal dialysis: Secondary | ICD-10-CM | POA: Diagnosis not present

## 2022-02-13 IMAGING — US US ABDOMEN LIMITED W/ ELASTOGRAPHY
1 series · 5 of 5 positions shown · non-contrast
Comparison: Ultrasound on [DATE] and MRI on [DATE]

CLINICAL DATA: Cirrhosis.

EXAM:
US ABDOMEN LIMITED - RIGHT UPPER QUADRANT
ULTRASOUND HEPATIC ELASTOGRAPHY
TECHNIQUE: Sonography of the right upper quadrant was performed. In addition,
ultrasound elastography evaluation of the liver was performed. A
region of interest was placed within the right lobe of the liver.
Following application of a compressive sonographic pulse, tissue
compressibility was assessed. Multiple assessments were performed at
the selected site. Median tissue compressibility was determined.
Previously, hepatic stiffness was assessed by shear wave velocity.
Based on recently published Society of [employer] in Ultrasound
consensus article, reporting is now recommended to be performed in
the SI units of pressure (kiloPascals) representing hepatic
stiffness/elasticity. The obtained result is compared to the
published reference standards. (cACLD = compensated Advanced Chronic
Liver Disease)

[Series 1: us abdomen ruq w/elastography · 5 of 5 slices shown]
[im 1/5]
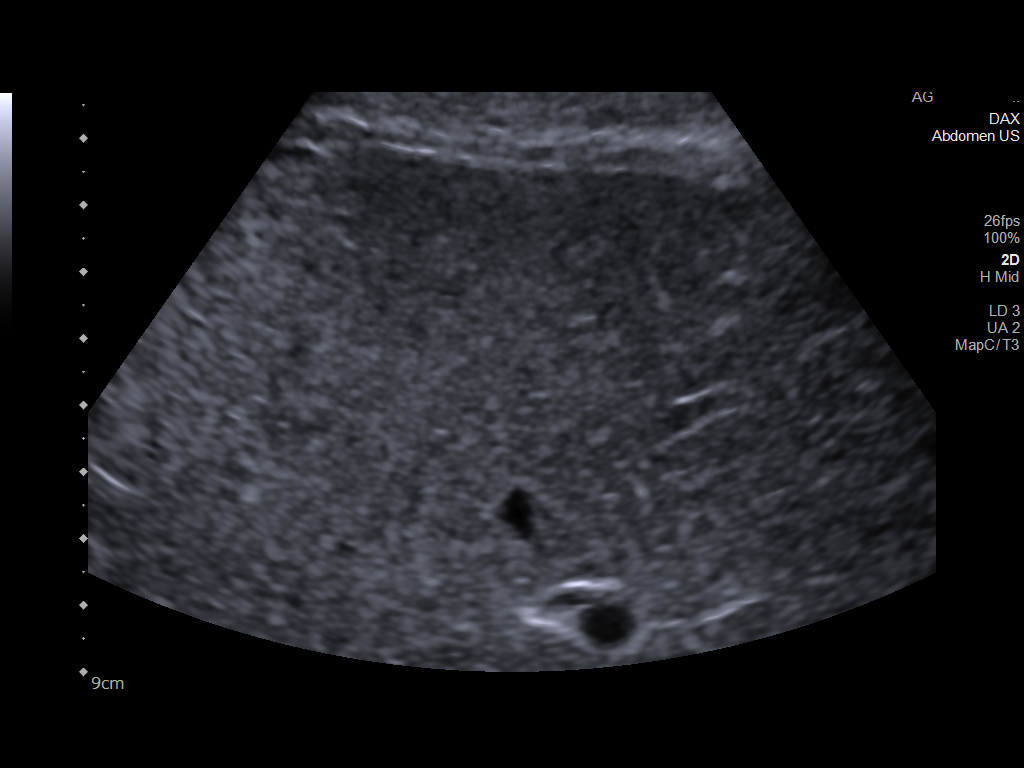
[im 2/5]
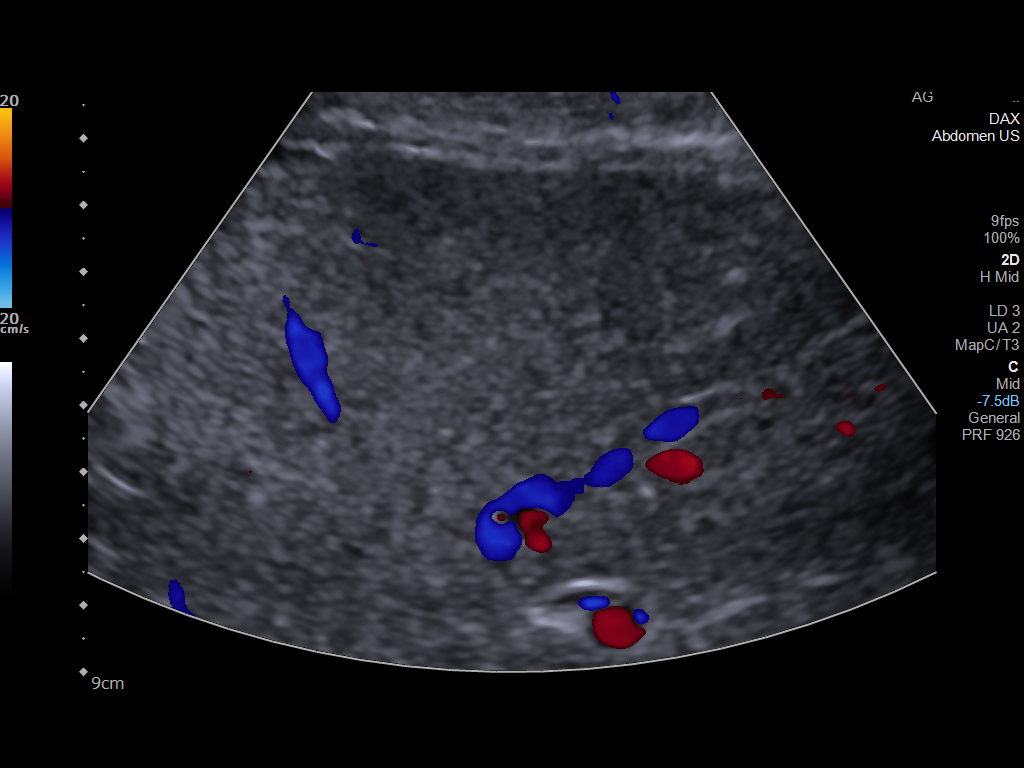
[im 3/5]
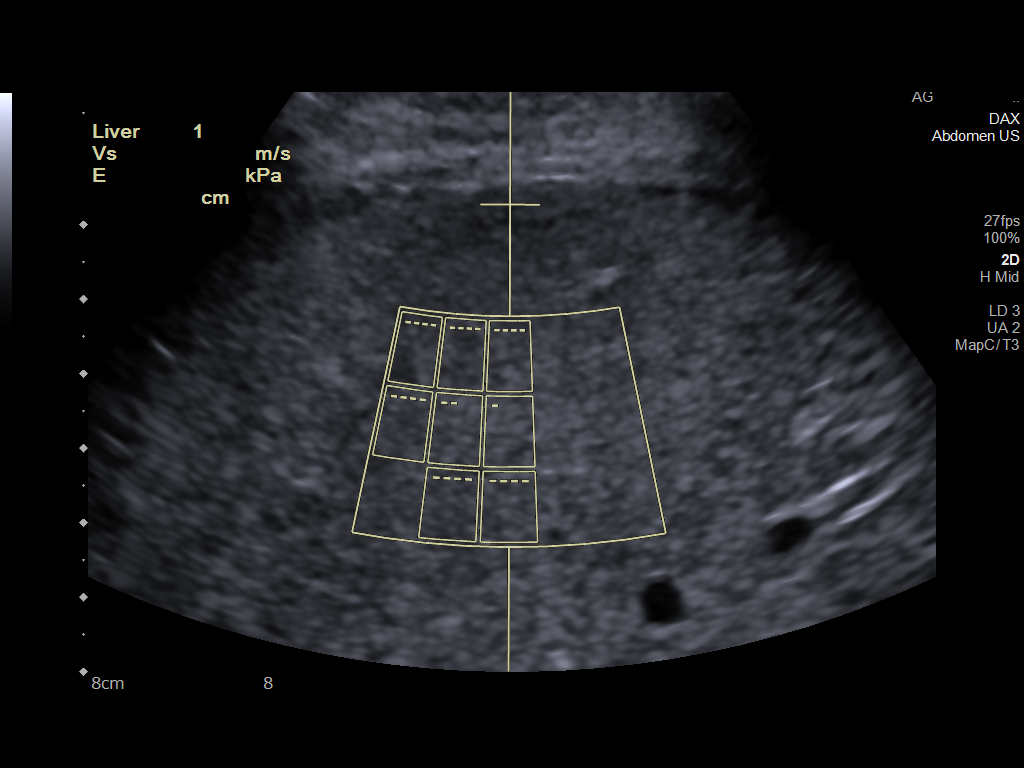
[im 4/5]
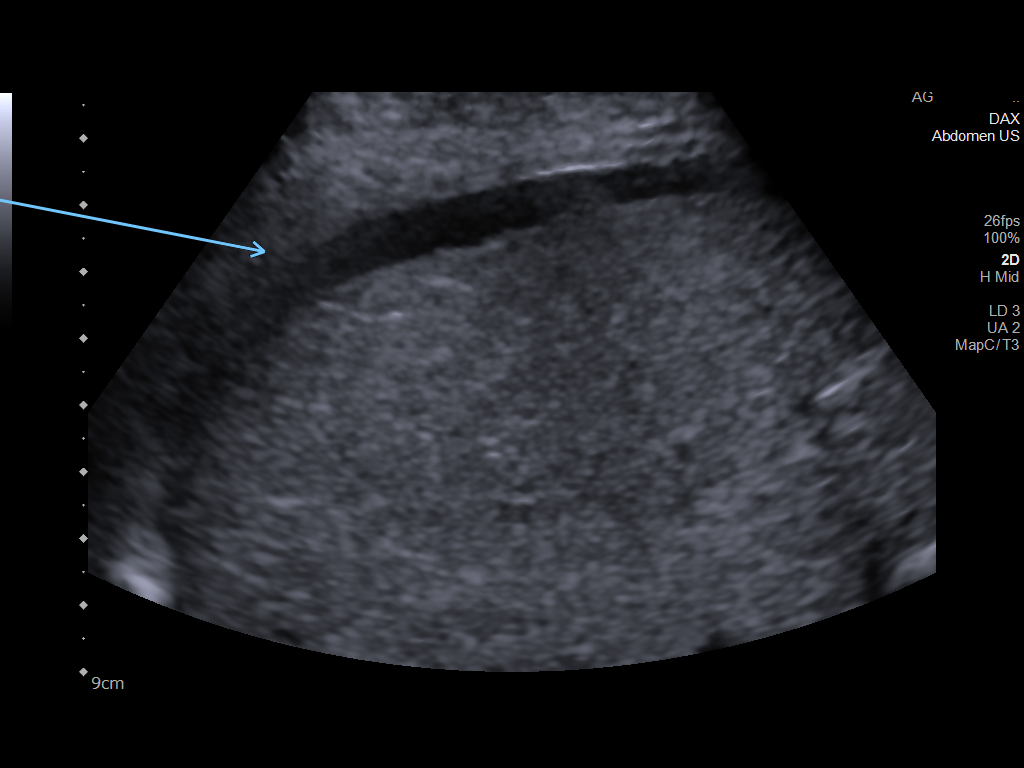
[im 5/5]
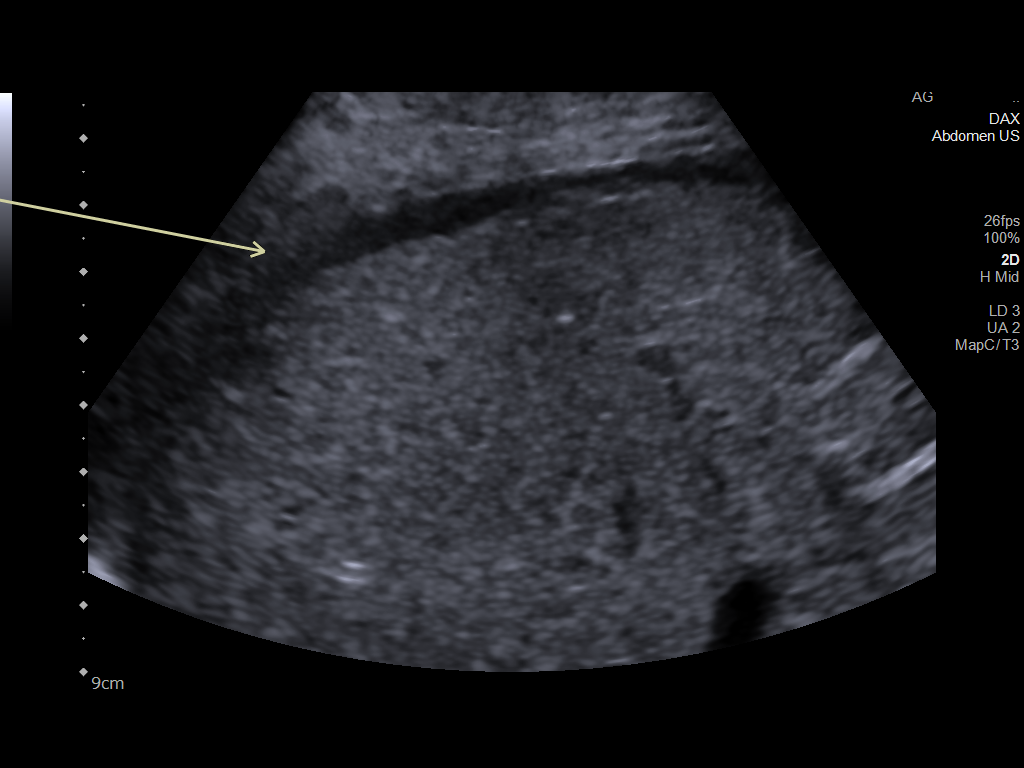

[5 of 5 positions shown; findings below may reference images not displayed]

FINDINGS: ULTRASOUND ABDOMEN LIMITED RIGHT UPPER QUADRANT

Gallbladder:

At least 1 large gallstone is seen measuring approximately 3.5 cm.
Gallbladder is incompletely distended. Diffuse wall thickening is
seen measuring 6 mm, likely due to combination of incomplete
distention and hepatocellular disease. No sonographic Murphy sign
noted by sonographer.

Common bile duct:

Diameter: 3 mm, within normal limits.

Liver:

Capsular nodularity is again seen, consistent with cirrhosis. Mild
perihepatic ascites is noted. A hyperechoic mass is seen in the
posterior liver dome measuring 3.4 x 3.3 x 3.2 cm. This is
consistent with a benign hemangioma is stable compared to prior MRI.
No other liver lesions identified. Portal vein is patent on color
Doppler imaging with normal direction of blood flow towards the
liver.

ULTRASOUND HEPATIC ELASTOGRAPHY

Device: Siemens Helix VTQ

Patient position: Supine

Transducer SARDASHT

Number of measurements: 10

Hepatic segment:  8

Median kPa:

IQR:

IQR/Median kPa ratio:

Data quality: IQR/Median kPa ratio of 0.3 or greater indicates
reduced accuracy

Diagnostic category:  >13 kPa: highly suggestive of cACLD

The use of hepatic elastography is applicable to patients with viral
hepatitis and non-alcoholic fatty liver disease. At this time, there
is insufficient data for the referenced cut-off values and use in
other causes of liver disease, including alcoholic liver disease.
Patients, however, may be assessed by elastography and serve as
their own reference standard/baseline.

In patients with non-alcoholic liver disease, the values suggesting
compensated advanced chronic liver disease (cACLD) may be lower, and
patients may need additional testing with elasticity results of [DATE]
kPa.

Please note that abnormal hepatic elasticity and shear wave
velocities may also be identified in clinical settings other than
with hepatic fibrosis, such as: acute hepatitis, elevated right
heart and central venous pressures including use of beta blockers,
SARDASHT disease (SARDASHT), infiltrative processes such as
mastocytosis/amyloidosis/infiltrative tumor/lymphoma, extrahepatic
cholestasis, with hyperemia in the post-prandial state, and with
liver transplantation. Correlation with patient history, laboratory
data, and clinical condition recommended.

Diagnostic Categories:

< or =5 kPa: high probability of being normal

< or =9 kPa: in the absence of other known clinical signs, rules [DATE] kPa and ?13 kPa: suggestive of cACLD, but needs further testing

>13 kPa: highly suggestive of cACLD

> or =17 kPa: highly suggestive of cACLD with an increased
probability of clinically significant portal hypertension
IMPRESSION: ULTRASOUND RUQ:

Cholelithiasis. No sonographic signs of acute cholecystitis or
biliary ductal dilatation.

Hepatic cirrhosis, with mild perihepatic ascites.

Stable 3.4 cm benign hepatic hemangioma. No other hepatic mass
identified.

ULTRASOUND HEPATIC ELASTOGRAPHY:

Median kPa:

Diagnostic category:  >13 kPa: highly suggestive of cACLD

## 2022-02-13 IMAGING — US US ABDOMEN LIMITED W/ ELASTOGRAPHY
1 series · 12 of 25 positions shown · non-contrast
Comparison: Ultrasound on [DATE] and MRI on [DATE]

CLINICAL DATA: Cirrhosis.

EXAM:
US ABDOMEN LIMITED - RIGHT UPPER QUADRANT
ULTRASOUND HEPATIC ELASTOGRAPHY
TECHNIQUE: Sonography of the right upper quadrant was performed. In addition,
ultrasound elastography evaluation of the liver was performed. A
region of interest was placed within the right lobe of the liver.
Following application of a compressive sonographic pulse, tissue
compressibility was assessed. Multiple assessments were performed at
the selected site. Median tissue compressibility was determined.
Previously, hepatic stiffness was assessed by shear wave velocity.
Based on recently published Society of Radiologists in Ultrasound
consensus article, reporting is now recommended to be performed in
the SI units of pressure (kiloPascals) representing hepatic
stiffness/elasticity. The obtained result is compared to the
published reference standards. (cACLD = compensated Advanced Chronic
Liver Disease)

[Series 1: us abdomen ruq w/elastography · 12 of 81 slices shown]
[im 4/81]
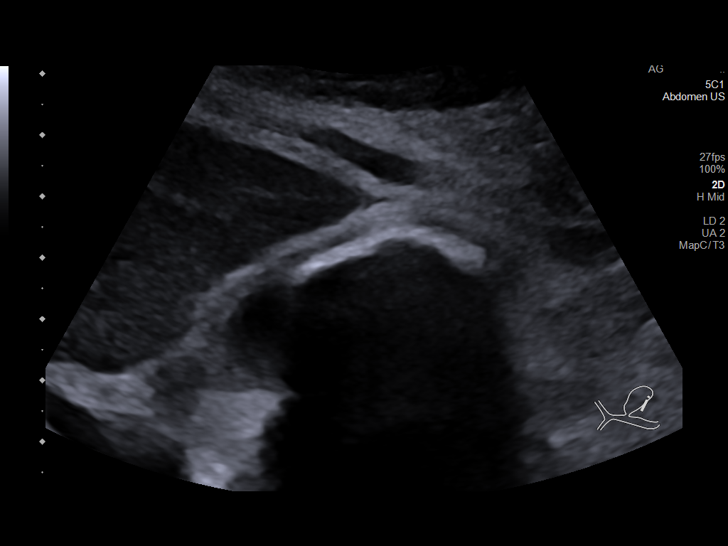
[im 11/81]
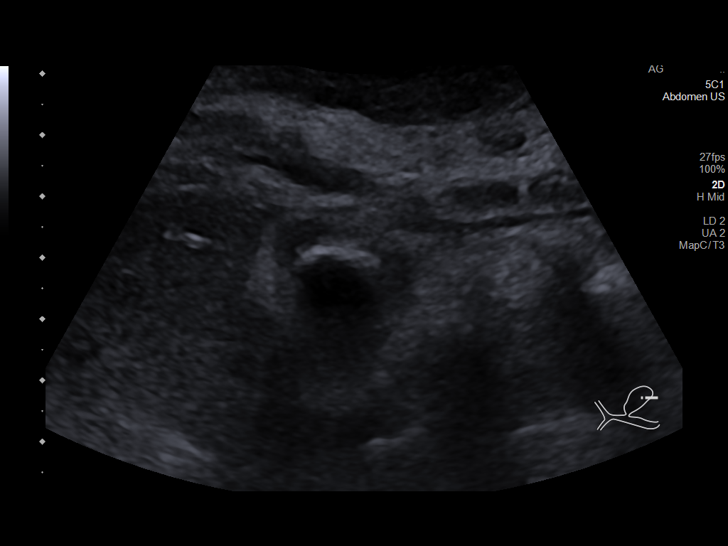
[im 17/81]
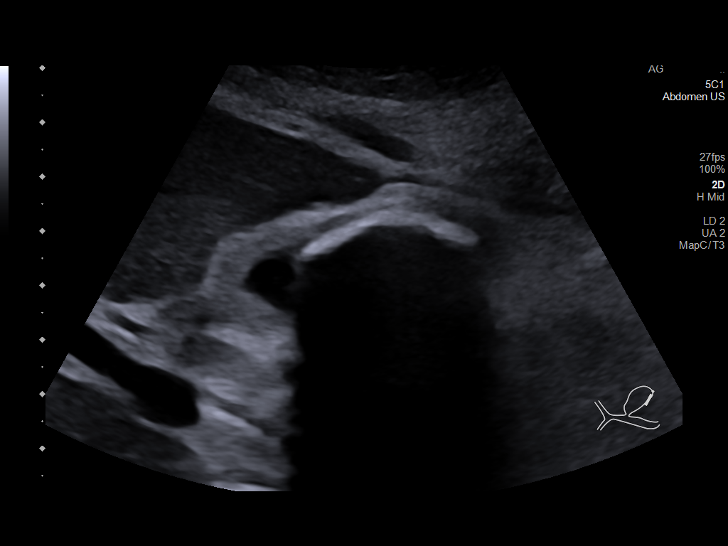
[im 24/81]
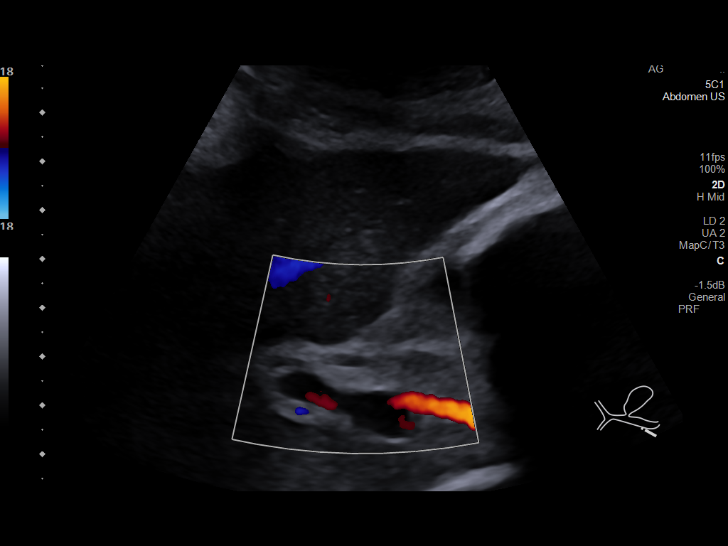
[im 31/81]
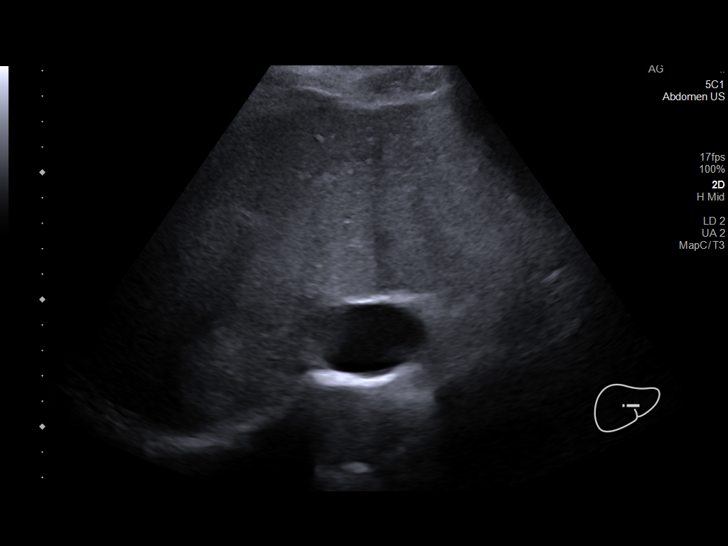
[im 37/81]
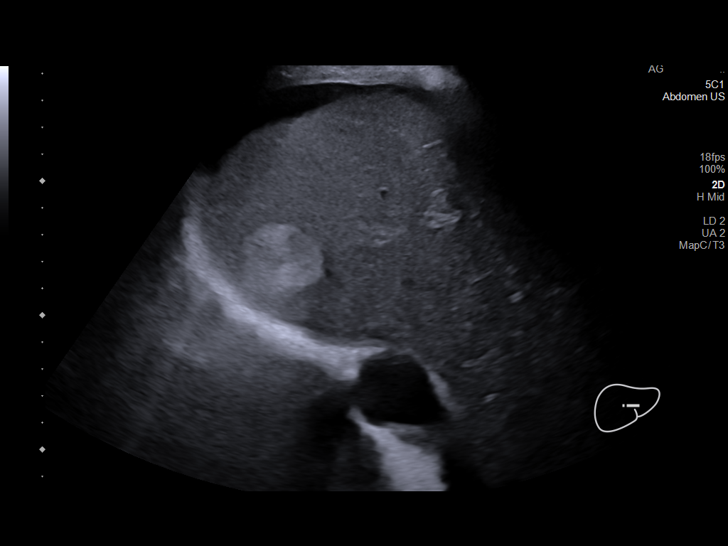
[im 44/81]
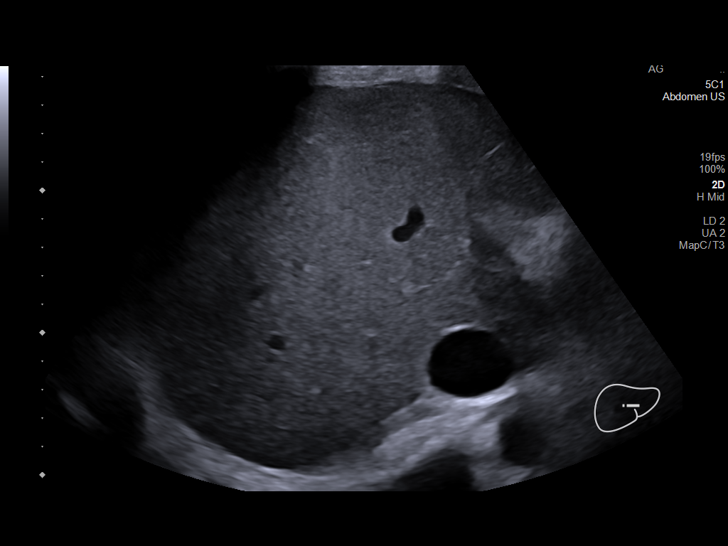
[im 51/81]
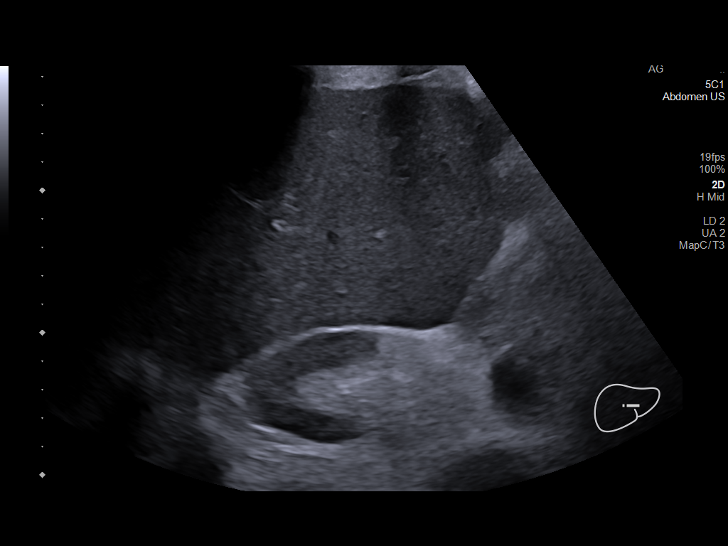
[im 57/81]
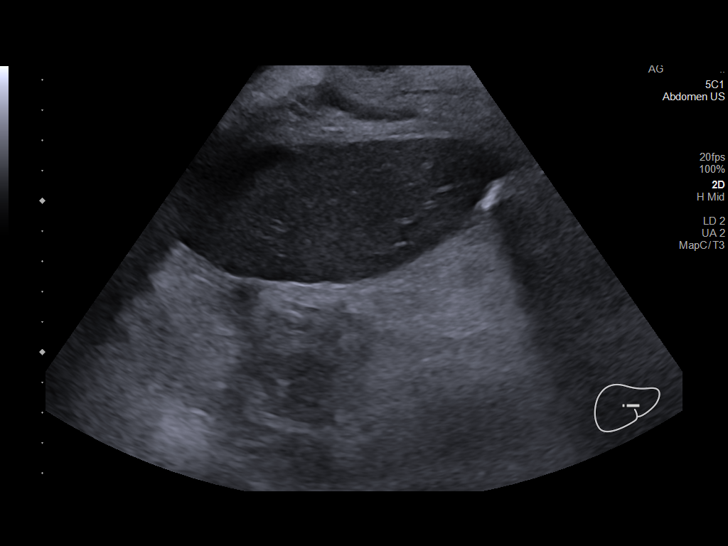
[im 64/81]
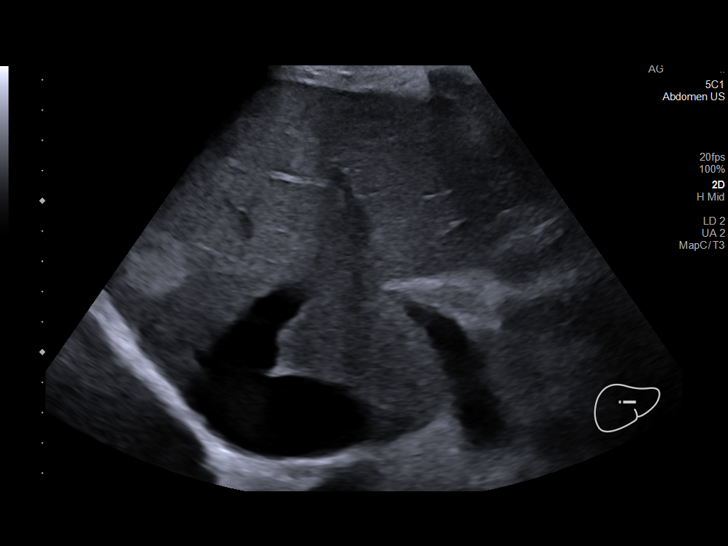
[im 71/81]
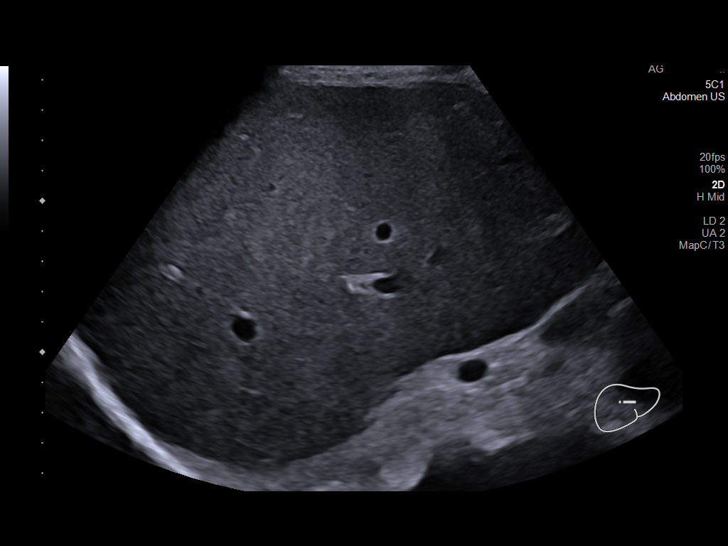
[im 77/81]
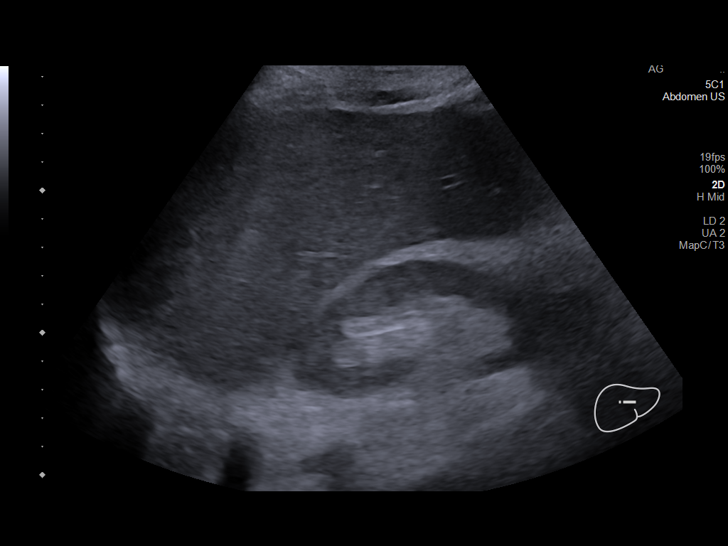

[12 of 25 positions shown; findings below may reference images not displayed]

FINDINGS: ULTRASOUND ABDOMEN LIMITED RIGHT UPPER QUADRANT

Gallbladder:

At least 1 large gallstone is seen measuring approximately 3.5 cm.
Gallbladder is incompletely distended. Diffuse wall thickening is
seen measuring 6 mm, likely due to combination of incomplete
distention and hepatocellular disease. No sonographic Murphy sign
noted by sonographer.

Common bile duct:

Diameter: 3 mm, within normal limits.

Liver:

Capsular nodularity is again seen, consistent with cirrhosis. Mild
perihepatic ascites is noted. A hyperechoic mass is seen in the
posterior liver dome measuring 3.4 x 3.3 x 3.2 cm. This is
consistent with a benign hemangioma is stable compared to prior MRI.
No other liver lesions identified. Portal vein is patent on color
Doppler imaging with normal direction of blood flow towards the
liver.

ULTRASOUND HEPATIC ELASTOGRAPHY

Device: Siemens Helix VTQ

Patient position: Supine

Transducer SARDASHT

Number of measurements: 10

Hepatic segment:  8

Median kPa:

IQR:

IQR/Median kPa ratio:

Data quality: IQR/Median kPa ratio of 0.3 or greater indicates
reduced accuracy

Diagnostic category:  >13 kPa: highly suggestive of cACLD

The use of hepatic elastography is applicable to patients with viral
hepatitis and non-alcoholic fatty liver disease. At this time, there
is insufficient data for the referenced cut-off values and use in
other causes of liver disease, including alcoholic liver disease.
Patients, however, may be assessed by elastography and serve as
their own reference standard/baseline.

In patients with non-alcoholic liver disease, the values suggesting
compensated advanced chronic liver disease (cACLD) may be lower, and
patients may need additional testing with elasticity results of [DATE]
kPa.

Please note that abnormal hepatic elasticity and shear wave
velocities may also be identified in clinical settings other than
with hepatic fibrosis, such as: acute hepatitis, elevated right
heart and central venous pressures including use of beta blockers,
SARDASHT disease (SARDASHT), infiltrative processes such as
mastocytosis/amyloidosis/infiltrative tumor/lymphoma, extrahepatic
cholestasis, with hyperemia in the post-prandial state, and with
liver transplantation. Correlation with patient history, laboratory
data, and clinical condition recommended.

Diagnostic Categories:

< or =5 kPa: high probability of being normal

< or =9 kPa: in the absence of other known clinical signs, rules [DATE] kPa and ?13 kPa: suggestive of cACLD, but needs further testing

>13 kPa: highly suggestive of cACLD

> or =17 kPa: highly suggestive of cACLD with an increased
probability of clinically significant portal hypertension
IMPRESSION: ULTRASOUND RUQ:

Cholelithiasis. No sonographic signs of acute cholecystitis or
biliary ductal dilatation.

Hepatic cirrhosis, with mild perihepatic ascites.

Stable 3.4 cm benign hepatic hemangioma. No other hepatic mass
identified.

ULTRASOUND HEPATIC ELASTOGRAPHY:

Median kPa:

Diagnostic category:  >13 kPa: highly suggestive of cACLD

## 2022-02-14 ENCOUNTER — Other Ambulatory Visit (HOSPITAL_COMMUNITY): Payer: Self-pay | Admitting: Adult Health

## 2022-02-14 DIAGNOSIS — N186 End stage renal disease: Secondary | ICD-10-CM | POA: Diagnosis not present

## 2022-02-14 DIAGNOSIS — N6310 Unspecified lump in the right breast, unspecified quadrant: Secondary | ICD-10-CM

## 2022-02-14 DIAGNOSIS — Z992 Dependence on renal dialysis: Secondary | ICD-10-CM | POA: Diagnosis not present

## 2022-02-15 ENCOUNTER — Other Ambulatory Visit: Payer: Self-pay | Admitting: Cardiology

## 2022-02-15 DIAGNOSIS — Z992 Dependence on renal dialysis: Secondary | ICD-10-CM | POA: Diagnosis not present

## 2022-02-15 DIAGNOSIS — N186 End stage renal disease: Secondary | ICD-10-CM | POA: Diagnosis not present

## 2022-02-16 ENCOUNTER — Other Ambulatory Visit: Payer: Self-pay | Admitting: Otolaryngology

## 2022-02-16 DIAGNOSIS — J3801 Paralysis of vocal cords and larynx, unilateral: Secondary | ICD-10-CM

## 2022-02-16 DIAGNOSIS — Z992 Dependence on renal dialysis: Secondary | ICD-10-CM | POA: Diagnosis not present

## 2022-02-16 DIAGNOSIS — E041 Nontoxic single thyroid nodule: Secondary | ICD-10-CM

## 2022-02-16 DIAGNOSIS — N186 End stage renal disease: Secondary | ICD-10-CM | POA: Diagnosis not present

## 2022-02-17 DIAGNOSIS — Z992 Dependence on renal dialysis: Secondary | ICD-10-CM | POA: Diagnosis not present

## 2022-02-17 DIAGNOSIS — N186 End stage renal disease: Secondary | ICD-10-CM | POA: Diagnosis not present

## 2022-02-17 DIAGNOSIS — Z4902 Encounter for fitting and adjustment of peritoneal dialysis catheter: Secondary | ICD-10-CM | POA: Diagnosis not present

## 2022-02-17 DIAGNOSIS — Z6834 Body mass index (BMI) 34.0-34.9, adult: Secondary | ICD-10-CM | POA: Diagnosis not present

## 2022-02-17 DIAGNOSIS — E669 Obesity, unspecified: Secondary | ICD-10-CM | POA: Diagnosis not present

## 2022-02-18 DIAGNOSIS — Z992 Dependence on renal dialysis: Secondary | ICD-10-CM | POA: Diagnosis not present

## 2022-02-18 DIAGNOSIS — N186 End stage renal disease: Secondary | ICD-10-CM | POA: Diagnosis not present

## 2022-02-19 DIAGNOSIS — Z992 Dependence on renal dialysis: Secondary | ICD-10-CM | POA: Diagnosis not present

## 2022-02-19 DIAGNOSIS — N186 End stage renal disease: Secondary | ICD-10-CM | POA: Diagnosis not present

## 2022-02-20 ENCOUNTER — Other Ambulatory Visit: Payer: Self-pay

## 2022-02-20 ENCOUNTER — Emergency Department (HOSPITAL_COMMUNITY)
Admission: EM | Admit: 2022-02-20 | Discharge: 2022-02-21 | Disposition: A | Discharge status: Home / Self Care | Payer: Medicare HMO | Attending: Emergency Medicine | Admitting: Emergency Medicine

## 2022-02-20 ENCOUNTER — Encounter (HOSPITAL_COMMUNITY): Payer: Self-pay | Admitting: *Deleted

## 2022-02-20 DIAGNOSIS — E876 Hypokalemia: Secondary | ICD-10-CM | POA: Insufficient documentation

## 2022-02-20 DIAGNOSIS — N189 Chronic kidney disease, unspecified: Secondary | ICD-10-CM | POA: Diagnosis not present

## 2022-02-20 DIAGNOSIS — L03116 Cellulitis of left lower limb: Secondary | ICD-10-CM | POA: Insufficient documentation

## 2022-02-20 DIAGNOSIS — N186 End stage renal disease: Secondary | ICD-10-CM | POA: Diagnosis not present

## 2022-02-20 DIAGNOSIS — Z992 Dependence on renal dialysis: Secondary | ICD-10-CM | POA: Diagnosis not present

## 2022-02-20 DIAGNOSIS — R600 Localized edema: Secondary | ICD-10-CM | POA: Insufficient documentation

## 2022-02-20 DIAGNOSIS — M7989 Other specified soft tissue disorders: Secondary | ICD-10-CM | POA: Diagnosis present

## 2022-02-20 LAB — CBC WITH DIFFERENTIAL/PLATELET
Abs Immature Granulocytes: 0.06 10*3/uL (ref 0.00–0.07)
Basophils Absolute: 0.1 10*3/uL (ref 0.0–0.1)
Basophils Relative: 1 %
Eosinophils Absolute: 0.1 10*3/uL (ref 0.0–0.5)
Eosinophils Relative: 1 %
HCT: 33.7 % — ABNORMAL LOW (ref 36.0–46.0)
Hemoglobin: 10.9 g/dL — ABNORMAL LOW (ref 12.0–15.0)
Immature Granulocytes: 1 %
Lymphocytes Relative: 10 %
Lymphs Abs: 1.2 10*3/uL (ref 0.7–4.0)
MCH: 32.2 pg (ref 26.0–34.0)
MCHC: 32.3 g/dL (ref 30.0–36.0)
MCV: 99.4 fL (ref 80.0–100.0)
Monocytes Absolute: 1 10*3/uL (ref 0.1–1.0)
Monocytes Relative: 8 %
Neutro Abs: 9.8 10*3/uL — ABNORMAL HIGH (ref 1.7–7.7)
Neutrophils Relative %: 79 %
Platelets: 473 10*3/uL — ABNORMAL HIGH (ref 150–400)
RBC: 3.39 MIL/uL — ABNORMAL LOW (ref 3.87–5.11)
RDW: 16 % — ABNORMAL HIGH (ref 11.5–15.5)
WBC: 12.3 10*3/uL — ABNORMAL HIGH (ref 4.0–10.5)
nRBC: 0 % (ref 0.0–0.2)

## 2022-02-20 NOTE — ED Triage Notes (Signed)
Pt with bilateral leg swelling, discoloration to left lower leg x 3 days. Pt is on home peritoneal dialysis.

## 2022-02-20 NOTE — ED Notes (Signed)
ED Provider at bedside. 

## 2022-02-20 NOTE — ED Provider Notes (Signed)
46 year old female presents emergency department with complaints of left lower extremity pain and redness.  Patient states that symptoms have been progressive over the past week.  She has at home peritoneal dialysis every day and deals with intermittent leg swelling.  Leg swelling upon presentation today is no different from patient's baseline per patient.  Area of redness noted on the medial aspect of patient's lower left extremity.  Area is warm to the touch and indurated in palpation.  No areas of fluctuance noted.  Patient has 5 out of 5 muscle strength of bilateral lower extremities and complaining of no sensory deficit.  Posterior tibial pulses full and intact bilaterally.  3+ pitting edema noted bilaterally.   Wilnette Kales, [place] 02/20/22 2258    Godfrey Pick, MD 02/21/22 [phone]

## 2022-02-21 DIAGNOSIS — Z992 Dependence on renal dialysis: Secondary | ICD-10-CM | POA: Diagnosis not present

## 2022-02-21 DIAGNOSIS — L03116 Cellulitis of left lower limb: Secondary | ICD-10-CM | POA: Diagnosis not present

## 2022-02-21 DIAGNOSIS — N186 End stage renal disease: Secondary | ICD-10-CM | POA: Diagnosis not present

## 2022-02-21 LAB — COMPREHENSIVE METABOLIC PANEL
ALT: 25 U/L (ref 0–44)
AST: 47 U/L — ABNORMAL HIGH (ref 15–41)
Albumin: 2.2 g/dL — ABNORMAL LOW (ref 3.5–5.0)
Alkaline Phosphatase: 232 U/L — ABNORMAL HIGH (ref 38–126)
Anion gap: 14 (ref 5–15)
BUN: 55 mg/dL — ABNORMAL HIGH (ref 6–20)
CO2: 17 mmol/L — ABNORMAL LOW (ref 22–32)
Calcium: 7.3 mg/dL — ABNORMAL LOW (ref 8.9–10.3)
Chloride: 101 mmol/L (ref 98–111)
Creatinine, Ser: 6.49 mg/dL — ABNORMAL HIGH (ref 0.44–1.00)
GFR, Estimated: 7 mL/min — ABNORMAL LOW (ref >60)
Glucose, Bld: 110 mg/dL — ABNORMAL HIGH (ref 70–99)
Potassium: 3 mmol/L — ABNORMAL LOW (ref 3.5–5.1)
Sodium: 132 mmol/L — ABNORMAL LOW (ref 135–145)
Total Bilirubin: 0.4 mg/dL (ref 0.3–1.2)
Total Protein: 5.8 g/dL — ABNORMAL LOW (ref 6.5–8.1)

## 2022-02-21 MED ORDER — CEFTRIAXONE SODIUM 1 G IJ SOLR
1.0000 g | Freq: Once | INTRAMUSCULAR | Status: AC
  Administered 2022-02-21: 1 g via INTRAMUSCULAR
  Filled 2022-02-21: qty 10

## 2022-02-21 MED ORDER — DOXYCYCLINE HYCLATE 100 MG PO CAPS: 100.0000 mg | ORAL_CAPSULE | Freq: Two times a day (BID) | ORAL | 0 refills | Status: AC

## 2022-02-21 MED ORDER — STERILE WATER FOR INJECTION IJ SOLN
INTRAMUSCULAR | Status: AC
  Filled 2022-02-21: qty 10

## 2022-02-21 MED ORDER — POTASSIUM CHLORIDE CRYS ER 20 MEQ PO TBCR
40.0000 meq | EXTENDED_RELEASE_TABLET | Freq: Once | ORAL | Status: AC
  Administered 2022-02-21: 40 meq via ORAL
  Filled 2022-02-21: qty 2

## 2022-02-21 MED ORDER — CEFDINIR 300 MG PO CAPS: 300.0000 mg | ORAL_CAPSULE | ORAL | 0 refills | Status: AC

## 2022-02-21 NOTE — ED Provider Notes (Signed)
[facility] EMERGENCY DEPARTMENT Provider Note   CSN: [id number] Arrival date & time: 02/20/22  1757     History  Chief Complaint  Patient presents with   Leg Swelling    Lauren Osborn is a 46 y.o. female.  Patient presents to the emergency department for evaluation of leg swelling and pain.  Patient reports that she has chronic lower extremity swelling.  She does have a history of chronic renal failure, performs home dialysis.  Patient reporting that she has noticed some redness and warmth to the inside portion of the left lower leg which is concerning to her.  She is unaware of any specific wounds to the area.  She has not had any fevers.       Home Medications Prior to Admission medications   Medication Sig Start Date End Date Taking? Authorizing Provider  cefdinir (OMNICEF) 300 MG capsule Take 1 capsule (300 mg total) by mouth every other day for 10 days. 02/21/22 03/03/22 Yes Karalyn Kadel, Gwenyth Allegra, MD  doxycycline (VIBRAMYCIN) 100 MG capsule Take 1 capsule (100 mg total) by mouth 2 (two) times daily. 02/21/22  Yes Casmer Yepiz, Gwenyth Allegra, MD  acetaminophen (TYLENOL) 500 MG tablet Take 1,000 mg by mouth every 6 (six) hours as needed for moderate pain or headache.    [provider]  allopurinol (ZYLOPRIM) 100 MG tablet Take 1 tablet (100 mg total) by mouth 3 (three) times a week. Patient taking differently: Take 100 mg by mouth every Monday, Wednesday, and Friday. 08/26/21   Strader, Fransisco Hertz, PA-C  furosemide (LASIX) 80 MG tablet Take 80 mg by mouth daily. 01/26/22   [provider]  midodrine (PROAMATINE) 5 MG tablet TAKE 3 TABLETS BY MOUTH 3 TIMES A DAY WITH MEALS 02/15/22   Satira Sark, MD  pantoprazole (PROTONIX) 40 MG tablet TAKE 1 TABLET BY MOUTH EVERY DAY 01/23/22   Strader, [place] M, PA-C  TART CHERRY PO Take 1 tablet by mouth 3 (three) times a week. Tuesdays, Saturdays & Sundays in the morning.    [provider]  traMADol (ULTRAM) 50  MG tablet Take 1 tablet (50 mg total) by mouth every 6 (six) hours as needed. Patient not taking: Reported on 02/01/2022 11/24/21 11/24/22  [name], PA-C      Allergies    Oxycodone-acetaminophen    Review of Systems   Review of Systems  Physical Exam Updated Vital Signs BP 133/89 (BP Location: Right Arm)   Pulse 98   Temp 98.1 F (36.7 C) (Oral)   Resp 20   Ht '5\' 3"'$  (1.6 m)   Wt 93.9 kg   SpO2 100%   BMI 36.67 kg/m  Physical Exam Vitals and nursing note reviewed.  Constitutional:      General: She is not in acute distress.    Appearance: She is well-developed.  HENT:     Head: Normocephalic and atraumatic.     Mouth/Throat:     Mouth: Mucous membranes are moist.  Eyes:     General: Vision grossly intact. Gaze aligned appropriately.     Extraocular Movements: Extraocular movements intact.     Conjunctiva/sclera: Conjunctivae normal.  Cardiovascular:     Rate and Rhythm: Normal rate and regular rhythm.     Pulses: Normal pulses.     Heart sounds: Normal heart sounds, S1 normal and S2 normal. No murmur heard.    No friction rub. No gallop.  Pulmonary:     Effort: Pulmonary effort is normal. No respiratory distress.  Breath sounds: Normal breath sounds.  Abdominal:     General: Bowel sounds are normal.     Palpations: Abdomen is soft.     Tenderness: There is no abdominal tenderness. There is no guarding or rebound.     Hernia: No hernia is present.  Musculoskeletal:        General: No swelling.     Cervical back: Full passive range of motion without pain, normal range of motion and neck supple. No spinous process tenderness or muscular tenderness. Normal range of motion.     Right lower leg: Edema present.     Left lower leg: Edema present.     Comments: Bilateral chronic woody pitting edema of both legs.  Skin:    General: Skin is warm and dry.     Capillary Refill: Capillary refill takes less than 2 seconds.     Findings: Erythema (Distal aspect of  left lower leg, associated with warmth) present. No ecchymosis, rash or wound.  Neurological:     General: No focal deficit present.     Mental Status: She is alert and oriented to person, place, and time.     GCS: GCS eye subscore is 4. GCS verbal subscore is 5. GCS motor subscore is 6.     Cranial Nerves: Cranial nerves 2-12 are intact.     Sensory: Sensation is intact.     Motor: Motor function is intact.     Coordination: Coordination is intact.  Psychiatric:        Attention and Perception: Attention normal.        Mood and Affect: Mood normal.        Speech: Speech normal.        Behavior: Behavior normal.     ED Results / Procedures / Treatments   Labs (all labs ordered are listed, but only abnormal results are displayed) Labs Reviewed  COMPREHENSIVE METABOLIC PANEL - Abnormal; Notable for the following components:      Result Value   Sodium 132 (*)    Potassium 3.0 (*)    CO2 17 (*)    Glucose, Bld 110 (*)    BUN 55 (*)    Creatinine, Ser 6.49 (*)    Calcium 7.3 (*)    Total Protein 5.8 (*)    Albumin 2.2 (*)    AST 47 (*)    Alkaline Phosphatase 232 (*)    GFR, Estimated 7 (*)    All other components within normal limits  CBC WITH DIFFERENTIAL/PLATELET - Abnormal; Notable for the following components:   WBC 12.3 (*)    RBC 3.39 (*)    Hemoglobin 10.9 (*)    HCT 33.7 (*)    RDW 16.0 (*)    Platelets 473 (*)    Neutro Abs 9.8 (*)    All other components within normal limits    EKG None  Radiology No results found.  Procedures Procedures    Medications Ordered in ED Medications  cefTRIAXone (ROCEPHIN) injection 1 g (has no administration in time range)  potassium chloride SA (KLOR-CON M) CR tablet 40 mEq (has no administration in time range)    ED Course/ Medical Decision Making/ A&P                           Medical Decision Making Amount and/or Complexity of Data Reviewed Labs: ordered.   Patient with small area of erythema and warmth on  left lower leg associated  with her chronic edema of both lower extremities.  She is in no distress.  Area is localized, feel DVT is less likely than infection.  We will schedule outpatient venous duplex but initiate treatment for skin infection.        Final Clinical Impression(s) / ED Diagnoses Final diagnoses:  Hypokalemia  Cellulitis of left lower extremity    Rx / DC Orders ED Discharge Orders          Ordered    cefdinir (OMNICEF) 300 MG capsule  Every 48 hours        02/21/22 0059    doxycycline (VIBRAMYCIN) 100 MG capsule  2 times daily        02/21/22 0059    US Venous Img Lower Unilateral Left        02/21/22 0100              Orpah Greek, MD 02/21/22 [phone]

## 2022-02-21 NOTE — ED Notes (Signed)
ED Provider at bedside. 

## 2022-02-22 ENCOUNTER — Ambulatory Visit
Admission: RE | Admit: 2022-02-22 | Discharge: 2022-02-22 | Disposition: A | Discharge status: Home / Self Care | Payer: Medicaid Other | Source: Ambulatory Visit | Attending: Otolaryngology | Admitting: Otolaryngology

## 2022-02-22 ENCOUNTER — Other Ambulatory Visit (HOSPITAL_COMMUNITY)
Admission: RE | Admit: 2022-02-22 | Discharge: 2022-02-22 | Disposition: A | Discharge status: Home / Self Care | Payer: Medicare HMO | Source: Ambulatory Visit | Attending: Otolaryngology | Admitting: Otolaryngology

## 2022-02-22 DIAGNOSIS — J3801 Paralysis of vocal cords and larynx, unilateral: Secondary | ICD-10-CM

## 2022-02-22 DIAGNOSIS — Z992 Dependence on renal dialysis: Secondary | ICD-10-CM | POA: Diagnosis not present

## 2022-02-22 DIAGNOSIS — E041 Nontoxic single thyroid nodule: Secondary | ICD-10-CM | POA: Insufficient documentation

## 2022-02-22 DIAGNOSIS — N186 End stage renal disease: Secondary | ICD-10-CM | POA: Diagnosis not present

## 2022-02-23 ENCOUNTER — Ambulatory Visit (HOSPITAL_COMMUNITY)
Admission: RE | Admit: 2022-02-23 | Discharge: 2022-02-23 | Disposition: A | Discharge status: Home / Self Care | Payer: Medicare HMO | Source: Ambulatory Visit | Attending: Adult Health | Admitting: Adult Health

## 2022-02-23 DIAGNOSIS — N6310 Unspecified lump in the right breast, unspecified quadrant: Secondary | ICD-10-CM | POA: Diagnosis present

## 2022-02-23 DIAGNOSIS — R921 Mammographic calcification found on diagnostic imaging of breast: Secondary | ICD-10-CM | POA: Diagnosis not present

## 2022-02-23 DIAGNOSIS — I509 Heart failure, unspecified: Secondary | ICD-10-CM | POA: Diagnosis not present

## 2022-02-23 DIAGNOSIS — N186 End stage renal disease: Secondary | ICD-10-CM | POA: Insufficient documentation

## 2022-02-23 DIAGNOSIS — R234 Changes in skin texture: Secondary | ICD-10-CM | POA: Insufficient documentation

## 2022-02-23 DIAGNOSIS — N641 Fat necrosis of breast: Secondary | ICD-10-CM | POA: Insufficient documentation

## 2022-02-23 DIAGNOSIS — R928 Other abnormal and inconclusive findings on diagnostic imaging of breast: Secondary | ICD-10-CM | POA: Diagnosis not present

## 2022-02-23 DIAGNOSIS — Z992 Dependence on renal dialysis: Secondary | ICD-10-CM | POA: Diagnosis not present

## 2022-02-23 LAB — CYTOLOGY - NON PAP

## 2022-02-24 DIAGNOSIS — N186 End stage renal disease: Secondary | ICD-10-CM | POA: Diagnosis not present

## 2022-02-24 DIAGNOSIS — Z992 Dependence on renal dialysis: Secondary | ICD-10-CM | POA: Diagnosis not present

## 2022-02-25 DIAGNOSIS — N186 End stage renal disease: Secondary | ICD-10-CM | POA: Diagnosis not present

## 2022-02-25 DIAGNOSIS — Z992 Dependence on renal dialysis: Secondary | ICD-10-CM | POA: Diagnosis not present

## 2022-02-26 DIAGNOSIS — Z992 Dependence on renal dialysis: Secondary | ICD-10-CM | POA: Diagnosis not present

## 2022-02-26 DIAGNOSIS — N186 End stage renal disease: Secondary | ICD-10-CM | POA: Diagnosis not present

## 2022-02-27 ENCOUNTER — Emergency Department (HOSPITAL_COMMUNITY): Payer: Medicare HMO

## 2022-02-27 ENCOUNTER — Inpatient Hospital Stay (HOSPITAL_COMMUNITY)
Admission: EM | Admit: 2022-02-27 | Discharge: 2022-04-04 | DRG: 871 | Disposition: E | Payer: Medicare HMO | Attending: Internal Medicine | Admitting: Internal Medicine

## 2022-02-27 ENCOUNTER — Encounter (HOSPITAL_COMMUNITY): Payer: Self-pay | Admitting: Emergency Medicine

## 2022-02-27 ENCOUNTER — Other Ambulatory Visit: Payer: Self-pay

## 2022-02-27 DIAGNOSIS — I9589 Other hypotension: Secondary | ICD-10-CM | POA: Diagnosis present

## 2022-02-27 DIAGNOSIS — D1801 Hemangioma of skin and subcutaneous tissue: Secondary | ICD-10-CM | POA: Diagnosis present

## 2022-02-27 DIAGNOSIS — M109 Gout, unspecified: Secondary | ICD-10-CM | POA: Diagnosis present

## 2022-02-27 DIAGNOSIS — I272 Pulmonary hypertension, unspecified: Secondary | ICD-10-CM | POA: Diagnosis present

## 2022-02-27 DIAGNOSIS — K219 Gastro-esophageal reflux disease without esophagitis: Secondary | ICD-10-CM | POA: Diagnosis present

## 2022-02-27 DIAGNOSIS — Z90710 Acquired absence of both cervix and uterus: Secondary | ICD-10-CM

## 2022-02-27 DIAGNOSIS — E871 Hypo-osmolality and hyponatremia: Secondary | ICD-10-CM | POA: Diagnosis present

## 2022-02-27 DIAGNOSIS — A419 Sepsis, unspecified organism: Secondary | ICD-10-CM | POA: Diagnosis present

## 2022-02-27 DIAGNOSIS — Z833 Family history of diabetes mellitus: Secondary | ICD-10-CM

## 2022-02-27 DIAGNOSIS — Z6834 Body mass index (BMI) 34.0-34.9, adult: Secondary | ICD-10-CM

## 2022-02-27 DIAGNOSIS — G9341 Metabolic encephalopathy: Secondary | ICD-10-CM | POA: Diagnosis not present

## 2022-02-27 DIAGNOSIS — D631 Anemia in chronic kidney disease: Secondary | ICD-10-CM | POA: Diagnosis present

## 2022-02-27 DIAGNOSIS — R21 Rash and other nonspecific skin eruption: Secondary | ICD-10-CM | POA: Diagnosis not present

## 2022-02-27 DIAGNOSIS — Z8249 Family history of ischemic heart disease and other diseases of the circulatory system: Secondary | ICD-10-CM

## 2022-02-27 DIAGNOSIS — N186 End stage renal disease: Secondary | ICD-10-CM

## 2022-02-27 DIAGNOSIS — L03116 Cellulitis of left lower limb: Secondary | ICD-10-CM | POA: Diagnosis present

## 2022-02-27 DIAGNOSIS — L0390 Cellulitis, unspecified: Secondary | ICD-10-CM | POA: Diagnosis present

## 2022-02-27 DIAGNOSIS — Z800 Family history of malignant neoplasm of digestive organs: Secondary | ICD-10-CM | POA: Diagnosis not present

## 2022-02-27 DIAGNOSIS — R42 Dizziness and giddiness: Secondary | ICD-10-CM | POA: Diagnosis not present

## 2022-02-27 DIAGNOSIS — Z515 Encounter for palliative care: Secondary | ICD-10-CM | POA: Diagnosis not present

## 2022-02-27 DIAGNOSIS — K2100 Gastro-esophageal reflux disease with esophagitis, without bleeding: Secondary | ICD-10-CM | POA: Diagnosis not present

## 2022-02-27 DIAGNOSIS — E890 Postprocedural hypothyroidism: Secondary | ICD-10-CM | POA: Diagnosis present

## 2022-02-27 DIAGNOSIS — Z66 Do not resuscitate: Secondary | ICD-10-CM | POA: Diagnosis not present

## 2022-02-27 DIAGNOSIS — N2581 Secondary hyperparathyroidism of renal origin: Secondary | ICD-10-CM | POA: Diagnosis present

## 2022-02-27 DIAGNOSIS — E162 Hypoglycemia, unspecified: Secondary | ICD-10-CM | POA: Diagnosis not present

## 2022-02-27 DIAGNOSIS — L0103 Bullous impetigo: Secondary | ICD-10-CM | POA: Diagnosis present

## 2022-02-27 DIAGNOSIS — D65 Disseminated intravascular coagulation [defibrination syndrome]: Secondary | ICD-10-CM | POA: Diagnosis present

## 2022-02-27 DIAGNOSIS — I132 Hypertensive heart and chronic kidney disease with heart failure and with stage 5 chronic kidney disease, or end stage renal disease: Secondary | ICD-10-CM | POA: Diagnosis present

## 2022-02-27 DIAGNOSIS — M793 Panniculitis, unspecified: Secondary | ICD-10-CM | POA: Diagnosis present

## 2022-02-27 DIAGNOSIS — Z803 Family history of malignant neoplasm of breast: Secondary | ICD-10-CM

## 2022-02-27 DIAGNOSIS — R0609 Other forms of dyspnea: Secondary | ICD-10-CM | POA: Diagnosis not present

## 2022-02-27 DIAGNOSIS — Z87891 Personal history of nicotine dependence: Secondary | ICD-10-CM | POA: Diagnosis not present

## 2022-02-27 DIAGNOSIS — I5032 Chronic diastolic (congestive) heart failure: Secondary | ICD-10-CM | POA: Diagnosis present

## 2022-02-27 DIAGNOSIS — R6520 Severe sepsis without septic shock: Secondary | ICD-10-CM | POA: Diagnosis present

## 2022-02-27 DIAGNOSIS — E669 Obesity, unspecified: Secondary | ICD-10-CM | POA: Diagnosis present

## 2022-02-27 DIAGNOSIS — L03311 Cellulitis of abdominal wall: Secondary | ICD-10-CM | POA: Diagnosis present

## 2022-02-27 DIAGNOSIS — E872 Acidosis, unspecified: Secondary | ICD-10-CM

## 2022-02-27 DIAGNOSIS — Z992 Dependence on renal dialysis: Secondary | ICD-10-CM | POA: Diagnosis not present

## 2022-02-27 DIAGNOSIS — I129 Hypertensive chronic kidney disease with stage 1 through stage 4 chronic kidney disease, or unspecified chronic kidney disease: Secondary | ICD-10-CM | POA: Diagnosis not present

## 2022-02-27 DIAGNOSIS — L98499 Non-pressure chronic ulcer of skin of other sites with unspecified severity: Secondary | ICD-10-CM | POA: Diagnosis present

## 2022-02-27 DIAGNOSIS — I959 Hypotension, unspecified: Secondary | ICD-10-CM | POA: Diagnosis not present

## 2022-02-27 DIAGNOSIS — N189 Chronic kidney disease, unspecified: Secondary | ICD-10-CM

## 2022-02-27 DIAGNOSIS — D692 Other nonthrombocytopenic purpura: Secondary | ICD-10-CM | POA: Diagnosis not present

## 2022-02-27 DIAGNOSIS — R188 Other ascites: Secondary | ICD-10-CM | POA: Diagnosis present

## 2022-02-27 DIAGNOSIS — E8779 Other fluid overload: Secondary | ICD-10-CM | POA: Diagnosis not present

## 2022-02-27 DIAGNOSIS — K8020 Calculus of gallbladder without cholecystitis without obstruction: Secondary | ICD-10-CM | POA: Diagnosis not present

## 2022-02-27 DIAGNOSIS — Z79899 Other long term (current) drug therapy: Secondary | ICD-10-CM

## 2022-02-27 DIAGNOSIS — R600 Localized edema: Secondary | ICD-10-CM | POA: Diagnosis not present

## 2022-02-27 DIAGNOSIS — Z872 Personal history of diseases of the skin and subcutaneous tissue: Secondary | ICD-10-CM | POA: Diagnosis not present

## 2022-02-27 DIAGNOSIS — R6521 Severe sepsis with septic shock: Secondary | ICD-10-CM | POA: Diagnosis not present

## 2022-02-27 DIAGNOSIS — I950 Idiopathic hypotension: Secondary | ICD-10-CM | POA: Diagnosis not present

## 2022-02-27 DIAGNOSIS — Z452 Encounter for adjustment and management of vascular access device: Secondary | ICD-10-CM | POA: Diagnosis not present

## 2022-02-27 DIAGNOSIS — Z91158 Patient's noncompliance with renal dialysis for other reason: Secondary | ICD-10-CM

## 2022-02-27 DIAGNOSIS — Z043 Encounter for examination and observation following other accident: Secondary | ICD-10-CM | POA: Diagnosis not present

## 2022-02-27 DIAGNOSIS — H538 Other visual disturbances: Secondary | ICD-10-CM | POA: Diagnosis present

## 2022-02-27 DIAGNOSIS — D75838 Other thrombocytosis: Secondary | ICD-10-CM | POA: Diagnosis present

## 2022-02-27 LAB — CBC WITH DIFFERENTIAL/PLATELET
Abs Immature Granulocytes: 0.06 10*3/uL (ref 0.00–0.07)
Basophils Absolute: 0.1 10*3/uL (ref 0.0–0.1)
Basophils Relative: 1 %
Eosinophils Absolute: 0 10*3/uL (ref 0.0–0.5)
Eosinophils Relative: 0 %
HCT: 32.4 % — ABNORMAL LOW (ref 36.0–46.0)
Hemoglobin: 11 g/dL — ABNORMAL LOW (ref 12.0–15.0)
Immature Granulocytes: 1 %
Lymphocytes Relative: 8 %
Lymphs Abs: 1 10*3/uL (ref 0.7–4.0)
MCH: 32.4 pg (ref 26.0–34.0)
MCHC: 34 g/dL (ref 30.0–36.0)
MCV: 95.6 fL (ref 80.0–100.0)
Monocytes Absolute: 1.4 10*3/uL — ABNORMAL HIGH (ref 0.1–1.0)
Monocytes Relative: 11 %
Neutro Abs: 10.4 10*3/uL — ABNORMAL HIGH (ref 1.7–7.7)
Neutrophils Relative %: 79 %
Platelets: 521 10*3/uL — ABNORMAL HIGH (ref 150–400)
RBC: 3.39 MIL/uL — ABNORMAL LOW (ref 3.87–5.11)
RDW: 16.3 % — ABNORMAL HIGH (ref 11.5–15.5)
WBC: 13 10*3/uL — ABNORMAL HIGH (ref 4.0–10.5)
nRBC: 0 % (ref 0.0–0.2)

## 2022-02-27 LAB — LACTIC ACID, PLASMA
Lactic Acid, Venous: 1.3 mmol/L (ref 0.5–1.9)
Lactic Acid, Venous: 2 mmol/L (ref 0.5–1.9)

## 2022-02-27 LAB — APTT: aPTT: 33 seconds (ref 24–36)

## 2022-02-27 LAB — BASIC METABOLIC PANEL
Anion gap: 14 (ref 5–15)
BUN: 62 mg/dL — ABNORMAL HIGH (ref 6–20)
CO2: 19 mmol/L — ABNORMAL LOW (ref 22–32)
Calcium: 6.8 mg/dL — ABNORMAL LOW (ref 8.9–10.3)
Chloride: 96 mmol/L — ABNORMAL LOW (ref 98–111)
Creatinine, Ser: 6.23 mg/dL — ABNORMAL HIGH (ref 0.44–1.00)
GFR, Estimated: 8 mL/min — ABNORMAL LOW (ref >60)
Glucose, Bld: 88 mg/dL (ref 70–99)
Potassium: 3.5 mmol/L (ref 3.5–5.1)
Sodium: 129 mmol/L — ABNORMAL LOW (ref 135–145)

## 2022-02-27 LAB — PROTIME-INR
INR: 1.3 — ABNORMAL HIGH (ref 0.8–1.2)
Prothrombin Time: 16.1 seconds — ABNORMAL HIGH (ref 11.4–15.2)

## 2022-02-27 MED ORDER — SODIUM CHLORIDE 0.9 % IV SOLN
2.0000 g | INTRAVENOUS | Status: DC
  Administered 2022-02-27 – 2022-03-01 (×3): 2 g via INTRAVENOUS
  Filled 2022-02-27 (×3): qty 20

## 2022-02-27 MED ORDER — LACTATED RINGERS IV SOLN: INTRAVENOUS | Status: DC

## 2022-02-27 MED ORDER — SODIUM CHLORIDE 0.9 % IV BOLUS
1000.0000 mL | Freq: Once | INTRAVENOUS | Status: AC
  Administered 2022-02-27: 1000 mL via INTRAVENOUS

## 2022-02-27 MED ORDER — SODIUM CHLORIDE 0.9 % IV SOLN: 1.0000 g | Freq: Once | INTRAVENOUS | Status: DC

## 2022-02-27 NOTE — ED Provider Notes (Signed)
[facility] EMERGENCY DEPARTMENT Provider Note   CSN: [id number] Arrival date & time: 02/26/2022  1721     History  No chief complaint on file.   Lauren Osborn is a 46 y.o. female.  HPI  This patient is a 46 year old female who is on peritoneal dialysis for the last several months, previous to that she did have a dialysis catheter in her chest which has since been removed when the fistula in her left upper extremity matured.  She is currently using peritoneal dialysis and doing well with regards to that.  Unfortunately a couple of weeks ago the patient developed some redness in her left lower extremity, she was placed on doxycycline but despite this it is continued to be painful swell and become more red.  She does not have fevers or chills but is a little lightheaded.  She was noted to be hypotensive at 78/50 on arrival.  She was seen by her family doctor and told that she needed to be seen in the emergency department for IV antibiotics and to be admitted overnight.  Home Medications Prior to Admission medications   Medication Sig Start Date End Date Taking? Authorizing Provider  acetaminophen (TYLENOL) 500 MG tablet Take 1,000 mg by mouth every 6 (six) hours as needed for moderate pain or headache.    [provider]  allopurinol (ZYLOPRIM) 100 MG tablet Take 1 tablet (100 mg total) by mouth 3 (three) times a week. Patient taking differently: Take 100 mg by mouth every Monday, Wednesday, and Friday. 08/26/21   Ahmed Prima, Fransisco Hertz, PA-C  cefdinir (OMNICEF) 300 MG capsule Take 1 capsule (300 mg total) by mouth every other day for 10 days. 02/21/22 03/03/22  Orpah Greek, MD  doxycycline (VIBRAMYCIN) 100 MG capsule Take 1 capsule (100 mg total) by mouth 2 (two) times daily. 02/21/22   Orpah Greek, MD  furosemide (LASIX) 80 MG tablet Take 80 mg by mouth daily. 01/26/22   [provider]  midodrine (PROAMATINE) 5 MG tablet TAKE 3 TABLETS BY MOUTH 3 TIMES A  DAY WITH MEALS 02/15/22   Satira Sark, MD  pantoprazole (PROTONIX) 40 MG tablet TAKE 1 TABLET BY MOUTH EVERY DAY 01/23/22   Strader, [place] M, PA-C  TART CHERRY PO Take 1 tablet by mouth 3 (three) times a week. Tuesdays, Saturdays & Sundays in the morning.    [provider]  traMADol (ULTRAM) 50 MG tablet Take 1 tablet (50 mg total) by mouth every 6 (six) hours as needed. Patient not taking: Reported on 02/01/2022 11/24/21 11/24/22  [name], PA-C      Allergies    Oxycodone-acetaminophen    Review of Systems   Review of Systems  All other systems reviewed and are negative.   Physical Exam Updated Vital Signs BP (!) 78/50   Pulse 91   Temp 97.7 F (36.5 C) (Oral)   Resp 16   Ht 1.6 m ('5\' 3"'$ )   Wt 89.4 kg   SpO2 100%   BMI 34.90 kg/m  Physical Exam Vitals and nursing note reviewed.  Constitutional:      General: She is not in acute distress.    Appearance: She is well-developed.  HENT:     Head: Normocephalic and atraumatic.     Mouth/Throat:     Pharynx: No oropharyngeal exudate.  Eyes:     General: No scleral icterus.       Right eye: No discharge.        Left  eye: No discharge.     Conjunctiva/sclera: Conjunctivae normal.     Pupils: Pupils are equal, round, and reactive to light.  Neck:     Thyroid: No thyromegaly.     Vascular: No JVD.  Cardiovascular:     Rate and Rhythm: Normal rate and regular rhythm.     Heart sounds: Normal heart sounds. No murmur heard.    No friction rub. No gallop.     Comments: Fistula with a good thrill in the left antecubital area Pulmonary:     Effort: Pulmonary effort is normal. No respiratory distress.     Breath sounds: Normal breath sounds. No wheezing or rales.  Abdominal:     General: Bowel sounds are normal. There is no distension.     Palpations: Abdomen is soft. There is no mass.     Tenderness: There is no abdominal tenderness.  Musculoskeletal:        General: Swelling present. No tenderness.  Normal range of motion.     Cervical back: Normal range of motion and neck supple.     Right lower leg: Edema present.     Left lower leg: Edema present.  Lymphadenopathy:     Cervical: No cervical adenopathy.  Skin:    General: Skin is warm and dry.     Findings: Erythema present. No rash.  Neurological:     Mental Status: She is alert.     Coordination: Coordination normal.  Psychiatric:        Behavior: Behavior normal.     ED Results / Procedures / Treatments   Labs (all labs ordered are listed, but only abnormal results are displayed) Labs Reviewed  RESP PANEL BY RT-PCR (FLU A&B, COVID) ARPGX2  CULTURE, BLOOD (ROUTINE X 2)  CULTURE, BLOOD (ROUTINE X 2)  CBC WITH DIFFERENTIAL/PLATELET  BASIC METABOLIC PANEL  LACTIC ACID, PLASMA  LACTIC ACID, PLASMA  PROTIME-INR  APTT  URINALYSIS, ROUTINE W REFLEX MICROSCOPIC  POC URINE PREG, ED    EKG None  Radiology No results found.  Procedures Procedures    Medications Ordered in ED Medications  lactated ringers infusion (has no administration in time range)  cefTRIAXone (ROCEPHIN) 2 g in sodium chloride 0.9 % 100 mL IVPB (has no administration in time range)    ED Course/ Medical Decision Making/ A&P                           Medical Decision Making Amount and/or Complexity of Data Reviewed Labs: ordered. Radiology: ordered. ECG/medicine tests: ordered.  Risk Prescription drug management. Decision regarding hospitalization.   This patient presents to the ED for concern of possible infection of her leg, it certainly looks asymmetrical it is red it is hot to the touch and it is tender.  Both of her legs are swollen and edematous however this 1 appears infected.  That it is not improving with doxycycline and that she has hypotensive is very concerned, this involves an extensive number of treatment options, and is a complaint that carries with it a high risk of complications and morbidity.  The differential  diagnosis includes worsening infection, it appears that she does have some lymphedema, would also consider other renal abnormalities, compromised immune system secondary to renal dysfunction as well   Co morbidities that complicate the patient evaluation  End-stage renal disease on dialysis   Additional history obtained:  Additional history obtained from electronic medical record External records from outside source obtained and  reviewed including prior ER visits and antibiotic choices, the patient had been seen about a week ago for that, had been admitted to the hospital in April because of end-stage renal disease as well.  No other recent admissions other than the dialysis related issues.   Lab Tests:  I Ordered, and personally interpreted labs.  The pertinent results include: Leukocytosis, renal dysfunction but it seems to be at baseline   Imaging Studies ordered:  I ordered imaging studies including chest x-ray without acute findings, lower extremity without acute finding other than soft tissue swelling I independently visualized and interpreted imaging which showed soft tissue swelling, no free air to suggest necrotizing fasciitis I agree with the radiologist interpretation   Cardiac Monitoring: / EKG:  The patient was maintained on a cardiac monitor.  I personally viewed and interpreted the cardiac monitored which showed an underlying rhythm of: Normal sinus rhythm   Consultations Obtained:  I requested consultation with the hospitalist,  and discussed lab and imaging findings as well as pertinent plan - they recommend: Admission to the hospital   Problem List / ED Course / Critical interventions / Medication management  Antibiotics given for what appears to be a skin and soft tissue infection, IV fluids given for the patient's hypotension I ordered medication including IV fluids and antibiotic for hypotension and infection, lactic acid is normal, discussed with the  patient and she states that she chronically has hypotension this is not new for her and she is otherwise not symptomatic with regards to the blood pressure i.e. no lightheadedness or dizziness Reevaluation of the patient after these medicines showed that the patient improved I have reviewed the patients home medicines and have made adjustments as needed   Social Determinants of Health:  End-stage renal disease on hemodialysis at home   Test / Admission - Considered:  Admission to the hospital         Final Clinical Impression(s) / ED Diagnoses Final diagnoses:  None    Rx / DC Orders ED Discharge Orders     None         Noemi Chapel, MD 03/01/22 1127

## 2022-02-28 DIAGNOSIS — E8779 Other fluid overload: Secondary | ICD-10-CM | POA: Diagnosis not present

## 2022-02-28 DIAGNOSIS — N189 Chronic kidney disease, unspecified: Secondary | ICD-10-CM | POA: Diagnosis not present

## 2022-02-28 DIAGNOSIS — I272 Pulmonary hypertension, unspecified: Secondary | ICD-10-CM | POA: Diagnosis present

## 2022-02-28 DIAGNOSIS — Z452 Encounter for adjustment and management of vascular access device: Secondary | ICD-10-CM | POA: Diagnosis not present

## 2022-02-28 DIAGNOSIS — Z66 Do not resuscitate: Secondary | ICD-10-CM | POA: Diagnosis not present

## 2022-02-28 DIAGNOSIS — N186 End stage renal disease: Secondary | ICD-10-CM

## 2022-02-28 DIAGNOSIS — I9589 Other hypotension: Secondary | ICD-10-CM | POA: Diagnosis not present

## 2022-02-28 DIAGNOSIS — L03116 Cellulitis of left lower limb: Secondary | ICD-10-CM | POA: Diagnosis present

## 2022-02-28 DIAGNOSIS — R6520 Severe sepsis without septic shock: Secondary | ICD-10-CM | POA: Diagnosis present

## 2022-02-28 DIAGNOSIS — Z043 Encounter for examination and observation following other accident: Secondary | ICD-10-CM | POA: Diagnosis not present

## 2022-02-28 DIAGNOSIS — E871 Hypo-osmolality and hyponatremia: Secondary | ICD-10-CM | POA: Diagnosis present

## 2022-02-28 DIAGNOSIS — I950 Idiopathic hypotension: Secondary | ICD-10-CM | POA: Diagnosis not present

## 2022-02-28 DIAGNOSIS — Z800 Family history of malignant neoplasm of digestive organs: Secondary | ICD-10-CM | POA: Diagnosis not present

## 2022-02-28 DIAGNOSIS — D65 Disseminated intravascular coagulation [defibrination syndrome]: Secondary | ICD-10-CM | POA: Diagnosis present

## 2022-02-28 DIAGNOSIS — L0390 Cellulitis, unspecified: Secondary | ICD-10-CM | POA: Diagnosis present

## 2022-02-28 DIAGNOSIS — I959 Hypotension, unspecified: Secondary | ICD-10-CM

## 2022-02-28 DIAGNOSIS — Z515 Encounter for palliative care: Secondary | ICD-10-CM | POA: Diagnosis not present

## 2022-02-28 DIAGNOSIS — E890 Postprocedural hypothyroidism: Secondary | ICD-10-CM | POA: Diagnosis present

## 2022-02-28 DIAGNOSIS — R0609 Other forms of dyspnea: Secondary | ICD-10-CM | POA: Diagnosis not present

## 2022-02-28 DIAGNOSIS — G9341 Metabolic encephalopathy: Secondary | ICD-10-CM | POA: Diagnosis not present

## 2022-02-28 DIAGNOSIS — E669 Obesity, unspecified: Secondary | ICD-10-CM | POA: Diagnosis present

## 2022-02-28 DIAGNOSIS — I132 Hypertensive heart and chronic kidney disease with heart failure and with stage 5 chronic kidney disease, or end stage renal disease: Secondary | ICD-10-CM | POA: Diagnosis present

## 2022-02-28 DIAGNOSIS — L03311 Cellulitis of abdominal wall: Secondary | ICD-10-CM | POA: Diagnosis present

## 2022-02-28 DIAGNOSIS — E872 Acidosis, unspecified: Secondary | ICD-10-CM | POA: Diagnosis present

## 2022-02-28 DIAGNOSIS — D692 Other nonthrombocytopenic purpura: Secondary | ICD-10-CM | POA: Diagnosis not present

## 2022-02-28 DIAGNOSIS — Z992 Dependence on renal dialysis: Secondary | ICD-10-CM | POA: Diagnosis not present

## 2022-02-28 DIAGNOSIS — N2581 Secondary hyperparathyroidism of renal origin: Secondary | ICD-10-CM | POA: Diagnosis present

## 2022-02-28 DIAGNOSIS — D631 Anemia in chronic kidney disease: Secondary | ICD-10-CM | POA: Diagnosis present

## 2022-02-28 DIAGNOSIS — R6521 Severe sepsis with septic shock: Secondary | ICD-10-CM | POA: Diagnosis not present

## 2022-02-28 DIAGNOSIS — K2100 Gastro-esophageal reflux disease with esophagitis, without bleeding: Secondary | ICD-10-CM | POA: Diagnosis not present

## 2022-02-28 DIAGNOSIS — A419 Sepsis, unspecified organism: Secondary | ICD-10-CM | POA: Diagnosis present

## 2022-02-28 DIAGNOSIS — R188 Other ascites: Secondary | ICD-10-CM | POA: Diagnosis present

## 2022-02-28 DIAGNOSIS — I5032 Chronic diastolic (congestive) heart failure: Secondary | ICD-10-CM | POA: Diagnosis present

## 2022-02-28 DIAGNOSIS — R21 Rash and other nonspecific skin eruption: Secondary | ICD-10-CM | POA: Diagnosis not present

## 2022-02-28 DIAGNOSIS — K8020 Calculus of gallbladder without cholecystitis without obstruction: Secondary | ICD-10-CM | POA: Diagnosis not present

## 2022-02-28 DIAGNOSIS — Z87891 Personal history of nicotine dependence: Secondary | ICD-10-CM | POA: Diagnosis not present

## 2022-02-28 LAB — CBC WITH DIFFERENTIAL/PLATELET
Abs Immature Granulocytes: 0.07 10*3/uL (ref 0.00–0.07)
Basophils Absolute: 0.1 10*3/uL (ref 0.0–0.1)
Basophils Relative: 1 %
Eosinophils Absolute: 0.1 10*3/uL (ref 0.0–0.5)
Eosinophils Relative: 0 %
HCT: 33.6 % — ABNORMAL LOW (ref 36.0–46.0)
Hemoglobin: 11.9 g/dL — ABNORMAL LOW (ref 12.0–15.0)
Immature Granulocytes: 1 %
Lymphocytes Relative: 9 %
Lymphs Abs: 1.2 10*3/uL (ref 0.7–4.0)
MCH: 33.3 pg (ref 26.0–34.0)
MCHC: 35.4 g/dL (ref 30.0–36.0)
MCV: 94.1 fL (ref 80.0–100.0)
Monocytes Absolute: 1.3 10*3/uL — ABNORMAL HIGH (ref 0.1–1.0)
Monocytes Relative: 9 %
Neutro Abs: 11.5 10*3/uL — ABNORMAL HIGH (ref 1.7–7.7)
Neutrophils Relative %: 80 %
Platelets: 516 10*3/uL — ABNORMAL HIGH (ref 150–400)
RBC: 3.57 MIL/uL — ABNORMAL LOW (ref 3.87–5.11)
RDW: 16.1 % — ABNORMAL HIGH (ref 11.5–15.5)
WBC: 14.3 10*3/uL — ABNORMAL HIGH (ref 4.0–10.5)
nRBC: 0 % (ref 0.0–0.2)

## 2022-02-28 LAB — COMPREHENSIVE METABOLIC PANEL
ALT: 31 U/L (ref 0–44)
AST: 63 U/L — ABNORMAL HIGH (ref 15–41)
Albumin: 1.9 g/dL — ABNORMAL LOW (ref 3.5–5.0)
Alkaline Phosphatase: 274 U/L — ABNORMAL HIGH (ref 38–126)
Anion gap: 18 — ABNORMAL HIGH (ref 5–15)
BUN: 64 mg/dL — ABNORMAL HIGH (ref 6–20)
CO2: 16 mmol/L — ABNORMAL LOW (ref 22–32)
Calcium: 7.4 mg/dL — ABNORMAL LOW (ref 8.9–10.3)
Chloride: 99 mmol/L (ref 98–111)
Creatinine, Ser: 6.68 mg/dL — ABNORMAL HIGH (ref 0.44–1.00)
GFR, Estimated: 7 mL/min — ABNORMAL LOW (ref >60)
Glucose, Bld: 79 mg/dL (ref 70–99)
Potassium: 3.6 mmol/L (ref 3.5–5.1)
Sodium: 133 mmol/L — ABNORMAL LOW (ref 135–145)
Total Bilirubin: 1.1 mg/dL (ref 0.3–1.2)
Total Protein: 5.7 g/dL — ABNORMAL LOW (ref 6.5–8.1)

## 2022-02-28 LAB — MAGNESIUM: Magnesium: 1 mg/dL — ABNORMAL LOW (ref 1.7–2.4)

## 2022-02-28 LAB — BODY FLUID CELL COUNT WITH DIFFERENTIAL
Lymphs, Fluid: 38 %
Monocyte-Macrophage-Serous Fluid: 57 % (ref 50–90)
Neutrophil Count, Fluid: 5 % (ref 0–25)
Total Nucleated Cell Count, Fluid: 77 cu mm (ref 0–1000)

## 2022-02-28 LAB — LACTIC ACID, PLASMA: Lactic Acid, Venous: 2.6 mmol/L (ref 0.5–1.9)

## 2022-02-28 LAB — PHOSPHORUS: Phosphorus: 7.5 mg/dL — ABNORMAL HIGH (ref 2.5–4.6)

## 2022-02-28 LAB — CORTISOL-AM, BLOOD: Cortisol - AM: 27.5 ug/dL — ABNORMAL HIGH (ref 6.7–22.6)

## 2022-02-28 LAB — PROTIME-INR
INR: 1.3 — ABNORMAL HIGH (ref 0.8–1.2)
Prothrombin Time: 15.7 seconds — ABNORMAL HIGH (ref 11.4–15.2)

## 2022-02-28 LAB — PROCALCITONIN: Procalcitonin: 1.7 ng/mL

## 2022-02-28 LAB — HIV ANTIBODY (ROUTINE TESTING W REFLEX): HIV Screen 4th Generation wRfx: NONREACTIVE

## 2022-02-28 MED ORDER — MAGNESIUM SULFATE 4 GM/100ML IV SOLN
4.0000 g | Freq: Once | INTRAVENOUS | Status: AC
  Administered 2022-02-28: 4 g via INTRAVENOUS
  Filled 2022-02-28: qty 100

## 2022-02-28 MED ORDER — SODIUM CHLORIDE 0.9 % IV SOLN: INTRAVENOUS | Status: DC

## 2022-02-28 MED ORDER — ACETAMINOPHEN 650 MG RE SUPP: 650.0000 mg | Freq: Four times a day (QID) | RECTAL | Status: DC | PRN

## 2022-02-28 MED ORDER — HEPARIN SODIUM (PORCINE) 5000 UNIT/ML IJ SOLN
5000.0000 [IU] | Freq: Three times a day (TID) | INTRAMUSCULAR | Status: DC
  Administered 2022-02-28 – 2022-03-12 (×27): 5000 [IU] via SUBCUTANEOUS
  Filled 2022-02-28 (×34): qty 1

## 2022-02-28 MED ORDER — DELFLEX-LC/2.5% DEXTROSE 394 MOSM/L IP SOLN: INTRAPERITONEAL | Status: DC

## 2022-02-28 MED ORDER — ACETAMINOPHEN 325 MG PO TABS
650.0000 mg | ORAL_TABLET | Freq: Four times a day (QID) | ORAL | Status: DC | PRN
  Administered 2022-03-04 – 2022-03-11 (×6): 650 mg via ORAL
  Filled 2022-02-28 (×6): qty 2

## 2022-02-28 MED ORDER — GENTAMICIN SULFATE 0.1 % EX CREA
1.0000 | TOPICAL_CREAM | Freq: Every day | CUTANEOUS | Status: DC
  Administered 2022-02-28 – 2022-03-09 (×6): 1 via TOPICAL
  Filled 2022-02-28 (×2): qty 15

## 2022-02-28 MED ORDER — PANTOPRAZOLE SODIUM 40 MG PO TBEC
40.0000 mg | DELAYED_RELEASE_TABLET | Freq: Every day | ORAL | Status: DC
Start: 2022-02-28 — End: 2022-03-13
  Administered 2022-02-28 – 2022-03-12 (×13): 40 mg via ORAL
  Filled 2022-02-28 (×13): qty 1

## 2022-02-28 MED ORDER — OXYCODONE HCL 5 MG PO TABS
5.0000 mg | ORAL_TABLET | ORAL | Status: DC | PRN
  Administered 2022-02-28 – 2022-03-12 (×29): 5 mg via ORAL
  Filled 2022-02-28 (×32): qty 1

## 2022-02-28 MED ORDER — NYSTATIN 100000 UNIT/ML MT SUSP
5.0000 mL | Freq: Three times a day (TID) | OROMUCOSAL | Status: DC
  Administered 2022-02-28 – 2022-03-12 (×35): 500000 [IU] via ORAL
  Filled 2022-02-28 (×36): qty 5

## 2022-02-28 MED ORDER — ONDANSETRON HCL 4 MG PO TABS
4.0000 mg | ORAL_TABLET | Freq: Four times a day (QID) | ORAL | Status: DC | PRN
  Administered 2022-03-12: 4 mg via ORAL
  Filled 2022-02-28: qty 1

## 2022-02-28 MED ORDER — ONDANSETRON HCL 4 MG/2ML IJ SOLN
4.0000 mg | Freq: Four times a day (QID) | INTRAMUSCULAR | Status: DC | PRN
  Administered 2022-03-03 – 2022-03-06 (×5): 4 mg via INTRAVENOUS
  Filled 2022-02-28 (×5): qty 2

## 2022-02-28 MED ORDER — DELFLEX-LC/1.5% DEXTROSE 344 MOSM/L IP SOLN: INTRAPERITONEAL | Status: DC

## 2022-02-28 MED ORDER — MIDODRINE HCL 5 MG PO TABS
15.0000 mg | ORAL_TABLET | Freq: Three times a day (TID) | ORAL | Status: DC
  Administered 2022-02-28 – 2022-03-07 (×21): 15 mg via ORAL
  Filled 2022-02-28 (×21): qty 3

## 2022-02-28 MED ORDER — DELFLEX-LC/4.25% DEXTROSE 483 MOSM/L IP SOLN: INTRAPERITONEAL | Status: DC

## 2022-02-28 MED ORDER — CHLORHEXIDINE GLUCONATE CLOTH 2 % EX PADS
6.0000 | MEDICATED_PAD | Freq: Every day | CUTANEOUS | Status: DC
  Administered 2022-02-28 – 2022-03-13 (×14): 6 via TOPICAL

## 2022-02-28 NOTE — Assessment & Plan Note (Signed)
-   Patient had a heart rate of 92, respiratory rate of 23, leukocytosis of 13, blood pressure as low as 78/50, lactic acidosis 2.0 -Failed outpatient treatment for cellulitis with cefdinir and Doxy -Started on Rocephin for cellulitis order set today -1 L fluid bolus followed by 150 mL/h given in the ED, reducing fluids at admission due to ESRD -Chest x-ray shows no cardiopulmonary disease -X-ray tib-fib shows extensive subcutaneous edema.  No subcutaneous gas identified.  No evidence of osteomyelitis. -Continue Rocephin -Blood cultures pending -Continue to monitor

## 2022-03-01 DIAGNOSIS — Z992 Dependence on renal dialysis: Secondary | ICD-10-CM | POA: Diagnosis not present

## 2022-03-01 DIAGNOSIS — L0390 Cellulitis, unspecified: Secondary | ICD-10-CM | POA: Diagnosis not present

## 2022-03-01 DIAGNOSIS — N186 End stage renal disease: Secondary | ICD-10-CM | POA: Diagnosis not present

## 2022-03-01 DIAGNOSIS — A419 Sepsis, unspecified organism: Secondary | ICD-10-CM | POA: Diagnosis not present

## 2022-03-01 LAB — CBC
HCT: 34.5 % — ABNORMAL LOW (ref 36.0–46.0)
Hemoglobin: 11.9 g/dL — ABNORMAL LOW (ref 12.0–15.0)
MCH: 32.4 pg (ref 26.0–34.0)
MCHC: 34.5 g/dL (ref 30.0–36.0)
MCV: 94 fL (ref 80.0–100.0)
Platelets: UNDETERMINED 10*3/uL (ref 150–400)
RBC: 3.67 MIL/uL — ABNORMAL LOW (ref 3.87–5.11)
RDW: 15.8 % — ABNORMAL HIGH (ref 11.5–15.5)
WBC: 13.6 10*3/uL — ABNORMAL HIGH (ref 4.0–10.5)
nRBC: 0 % (ref 0.0–0.2)

## 2022-03-01 LAB — BASIC METABOLIC PANEL
Anion gap: 19 — ABNORMAL HIGH (ref 5–15)
BUN: 70 mg/dL — ABNORMAL HIGH (ref 6–20)
CO2: 18 mmol/L — ABNORMAL LOW (ref 22–32)
Calcium: 7.8 mg/dL — ABNORMAL LOW (ref 8.9–10.3)
Chloride: 93 mmol/L — ABNORMAL LOW (ref 98–111)
Creatinine, Ser: 6.94 mg/dL — ABNORMAL HIGH (ref 0.44–1.00)
GFR, Estimated: 7 mL/min — ABNORMAL LOW (ref >60)
Glucose, Bld: 100 mg/dL — ABNORMAL HIGH (ref 70–99)
Potassium: 3.7 mmol/L (ref 3.5–5.1)
Sodium: 130 mmol/L — ABNORMAL LOW (ref 135–145)

## 2022-03-01 LAB — PATHOLOGIST SMEAR REVIEW

## 2022-03-01 LAB — MAGNESIUM: Magnesium: 1.8 mg/dL (ref 1.7–2.4)

## 2022-03-01 MED ORDER — MAGNESIUM SULFATE 2 GM/50ML IV SOLN
2.0000 g | Freq: Once | INTRAVENOUS | Status: AC
  Administered 2022-03-01: 2 g via INTRAVENOUS
  Filled 2022-03-01: qty 50

## 2022-03-01 NOTE — Consult Note (Signed)
[facility] Nurse Consult Note: Reason for Consult: LLE cellulitis with medial blistering and warmth. Chart reviewed for past medical history and interventions and diagnostics this admission. Wound type:Infectious Pressure Injury POA: N/A Measurement: LE with edema and erythema, also medial blistering measuring 10cm x 3cm (intact) Wound bed:No wound at this time Drainage (amount, consistency, odor) None Periwound: As noted above Dressing procedure/placement/frequency: Discussed POC with Dr. Eliseo Squires. Twice daily dressing changes are indicated so that we will more quickly notice if blistering extends or progresses in a deleterious manner. Will implement guidance for Bedside RN via the Orders for twice daily cleanse followed by application of antimicrobial nonadherent (xeroform) topped with ABD pad for comfort and to absorb drainage should blisters rupture. The ABD is to be secured with Kerlix roll gauze applied from just below toes to just below knee/paper tape. The Kerlix is to be topped with a 6-inch ACE bandage applied in a similar manner (toe-to-knee) for gentle compression (13-48mHg).  Heels are to be floated and a sacral silicone bordered foam placed for Pressure Injury prophylaxis. Patient is able to turn and reposition herself  independently in bed.   [facility] will not follow, but will remain available to this patient, the nursing and medical teams.  Please re-consult if needed.  Thank you for inviting [place] participate in this patient's Plan of Care.  LMaudie Flakes MSN, RN, CNS, [place] CSerita Grammes [employer]Un[employer]phone:  [phone]

## 2022-03-01 NOTE — Plan of Care (Signed)

## 2022-03-01 NOTE — Progress Notes (Signed)
PROGRESS NOTE    Lauren Osborn  IOX:[id number] DOB: 01-03-1976 DOA: 03/03/2022 PCP: Carrolyn Meiers, MD    Brief Narrative:  Lauren Osborn is a 46 y.o. female with medical history significant of ESRD on peritoneal dialysis, diastolic congestive heart failure, essential hypertension, GERD, liver hemangioma, morbid obesity, thyroid nodule, and more presents to ED with a chief complaint of left leg pain.    Assessment and Plan: Sepsis due to cellulitis [facility] - Patient had a heart rate of 92, respiratory rate of 23, leukocytosis of 13, blood pressure as low as 78/50, lactic acidosis 2.0 -Failed outpatient treatment for cellulitis with cefdinir and Doxy -Chest x-ray shows no cardiopulmonary disease -X-ray tib-fib shows extensive subcutaneous edema.  No subcutaneous gas identified.  No evidence of osteomyelitis. -Continue Rocephin but may need escalation -Blood cultures pending -Continue to monitor -WOC consult appreciated  ESRD (end stage renal disease) ([place]) - ESRD since December 22 -Was initially on HD with a chest port, left AV fistula in place -Currently doing peritoneal dialysis at home -Nephrology  Hypotension - Chronic hypertension with patient and family reporting her blood pressures never above 100 -Continue midodrine dose from home 15 mg 3 times daily  GERD (gastroesophageal reflux disease) - Continue Protonix  Hyponatremia -probably from volume overload  Hypomagnesemia -repleted    DVT prophylaxis: heparin injection 5,000 Units Start: 02/28/22 0600 SCDs Start: 02/28/22 0245    Code Status: Full Code Family Communication:   Disposition Plan:  Level of care: Telemetry Medical Status is: Inpatient Remains inpatient appropriate because: needs IV abx and wound care    Consultants:  nephrology   Subjective: Swelling in LE worse than usual   Objective: Vitals:   02/28/22 1610 02/28/22 2243 03/01/22 0430 03/01/22 0849  BP: (!) 81/53 (!) 80/53  (!) 78/56 (!) 80/62  Pulse: 90   90  Resp: '18 11 15 19  '$ Temp: 98 F (36.7 C) 98.2 F (36.8 C)    TempSrc: Oral Oral    SpO2: 100% 99% 100% 98%  Weight:      Height:        Intake/Output Summary (Last 24 hours) at 03/01/2022 1311 Last data filed at 02/28/2022 2029 Gross per 24 hour  Intake 480 ml  Output --  Net 480 ml   Filed Weights   02/16/2022 1754  Weight: 89.4 kg    Examination:   General: Appearance:    Obese female in no acute distress     Lungs:     C respirations unlabored  Heart:    Normal heart rate.    MS:   Blister and cellulitis of lower leg   Neurologic:   Awake, alert, oriented x 3. No apparent focal neurological           defect.        Data Reviewed: I have personally reviewed following labs and imaging studies  CBC: Recent Labs  Lab 02/05/2022 1846 02/28/22 0354 03/01/22 0705  WBC 13.0* 14.3* 13.6*  NEUTROABS 10.4* 11.5*  --   HGB 11.0* 11.9* 11.9*  HCT 32.4* 33.6* 34.5*  MCV 95.6 94.1 94.0  PLT 521* 516* PLATELET CLUMPS NOTED ON SMEAR, UNABLE TO ESTIMATE   Basic Metabolic Panel: Recent Labs  Lab 02/11/2022 1846 02/28/22 0354 03/01/22 0705  NA 129* 133* 130*  K 3.5 3.6 3.7  CL 96* 99 93*  CO2 19* 16* 18*  GLUCOSE 88 79 100*  BUN 62* 64* 70*  CREATININE 6.23* 6.68* 6.94*  CALCIUM 6.8*  7.4* 7.8*  MG  --  1.0* 1.8  PHOS  --  7.5*  --    GFR: Estimated Creatinine Clearance: 10.6 mL/min (A) (by C-G formula based on SCr of 6.94 mg/dL (H)). Liver Function Tests: Recent Labs  Lab 02/28/22 0354  AST 63*  ALT 31  ALKPHOS 274*  BILITOT 1.1  PROT 5.7*  ALBUMIN 1.9*   No results for input(s): "LIPASE", "AMYLASE" in the last 168 hours. No results for input(s): "AMMONIA" in the last 168 hours. Coagulation Profile: Recent Labs  Lab 03/01/2022 1846 02/28/22 0354  INR 1.3* 1.3*   Cardiac Enzymes: No results for input(s): "CKTOTAL", "CKMB", "CKMBINDEX", "TROPONINI" in the last 168 hours. BNP (last 3 results) No results for  input(s): "PROBNP" in the last 8760 hours. HbA1C: No results for input(s): "HGBA1C" in the last 72 hours. CBG: No results for input(s): "GLUCAP" in the last 168 hours. Lipid Profile: No results for input(s): "CHOL", "HDL", "LDLCALC", "TRIG", "CHOLHDL", "LDLDIRECT" in the last 72 hours. Thyroid Function Tests: No results for input(s): "TSH", "T4TOTAL", "FREET4", "T3FREE", "THYROIDAB" in the last 72 hours. Anemia Panel: No results for input(s): "VITAMINB12", "FOLATE", "FERRITIN", "TIBC", "IRON", "RETICCTPCT" in the last 72 hours. Sepsis Labs: Recent Labs  Lab 02/09/2022 1851 02/16/2022 2024 02/28/22 0354  PROCALCITON  --   --  1.70  LATICACIDVEN 1.3 2.0* 2.6*    Recent Results (from the past 240 hour(s))  Blood Culture (routine x 2)     Status: None (Preliminary result)   Collection Time: 02/11/2022  6:51 PM   Specimen: Right Antecubital; Blood  Result Value Ref Range Status   Specimen Description RIGHT ANTECUBITAL  Final   Special Requests   Final    BOTTLES DRAWN AEROBIC AND ANAEROBIC Blood Culture adequate volume   Culture   Final    NO GROWTH 2 DAYS Performed at [facility], [address] [place], [place] [address]    Report Status PENDING  Incomplete  Blood Culture (routine x 2)     Status: None (Preliminary result)   Collection Time: 02/08/2022  8:24 PM   Specimen: BLOOD RIGHT HAND  Result Value Ref Range Status   Specimen Description BLOOD RIGHT HAND  Final   Special Requests   Final    BOTTLES DRAWN AEROBIC ONLY Blood Culture adequate volume   Culture   Final    NO GROWTH 2 DAYS Performed at [facility], [address] [place], [place] [address]    Report Status PENDING  Incomplete  Body fluid culture w Gram Stain     Status: None (Preliminary result)   Collection Time: 02/28/22  6:59 PM   Specimen: Peritoneal Washings; Body Fluid  Result Value Ref Range Status   Specimen Description PERITONEAL  Final   Special Requests Normal  Final   Gram Stain   Final    RARE  WBC PRESENT, PREDOMINANTLY MONONUCLEAR NO ORGANISMS SEEN    Culture   Final    NO GROWTH < 12 HOURS Performed at [facility], [address] [place], [place] [address]    Report Status PENDING  Incomplete         Radiology Studies: DG Tibia/Fibula Left  Result Date: 02/16/2022 CLINICAL DATA:  Cellulitis EXAM: LEFT TIBIA AND FIBULA - 2 VIEW COMPARISON:  None Available. FINDINGS: Normal alignment. No acute fracture or dislocation. No osseous erosion or abnormal periosteal reaction. Degenerative changes are noted within the knee. There is extensive subcutaneous edema of the visualized left lower extremity. No  subcutaneous gas identified. IMPRESSION: Extensive subcutaneous edema. No subcutaneous gas identified. No radiographic evidence of osteomyelitis. Electronically Signed   By: Fidela Salisbury M.D.   On: 02/17/2022 21:53   DG Chest 2 View  Result Date: 02/05/2022 CLINICAL DATA:  Bilateral lower extremity cellulitis.  Hypotensive. EXAM: CHEST - 2 VIEW COMPARISON:  None Available. FINDINGS: The heart size and mediastinal contours are within normal limits. Both lungs are clear. The visualized skeletal structures are unremarkable. IMPRESSION: No active cardiopulmonary disease. Electronically Signed   By: Ronney Asters M.D.   On: 02/12/2022 18:59        Scheduled Meds:  Chlorhexidine Gluconate Cloth  6 each Topical Q0600   gentamicin cream  1 Application Topical Daily   heparin  5,000 Units Subcutaneous Q8H   midodrine  15 mg Oral TID WC   nystatin  5 mL Oral TID   pantoprazole  40 mg Oral Daily   Continuous Infusions:  cefTRIAXone (ROCEPHIN)  IV Stopped (02/28/22 1900)   dialysis solution 1.5% low-MG/low-CA     dialysis solution 2.5% low-MG/low-CA     dialysis solution 4.25% low-MG/low-CA       LOS: 1 day    Time spent: 45 minutes spent on chart review, discussion with nursing staff, consultants, updating family and interview/physical exam; more than 50% of that time  was spent in counseling and/or coordination of care.    Geradine Girt, DO [facility] Available via Epic secure chat 7am-7pm After these hours, please refer to coverage provider listed on [email or web address] 03/01/2022, 1:12 PM

## 2022-03-01 NOTE — Progress Notes (Signed)
Nephrology Follow-Up Consult note   Assessment/Recommendations: Lauren Osborn is a/an 46 y.o. female with a past medical history significant for ESRD on HD admitted with cellulitis.    # ESRD: Continue 4 exchanges w/ 2.5L fill volume. Will add one 4.25% bag to help with volume removal, otherwise 2.5% bags. Total of 8hrs. No day dwell   # Volume/ hypotension: Appears total body volume overloaded w/ LEE. Some is chronic. Hypotension also chronic per patient on midodrine. PD as above.    # Anemia of Chronic Kidney Disease: Hemoglobin 11.9. No esa needed   # Secondary Hyperparathyroidism/Hyperphosphatemia: Phos 7.5 and calcium 7.4 but corrects to normal when accounting for albumin. Phos may improve with PD.    #Hypomagnesemia: now improved   #Anion gap metabolic acidosis: Associated with missing dialysis.  Has improved   #Milky effluent: Chronic issue.  PD fluid negative for infection   #Cellulitis: Antibiotics per primary team.  Will provide fungal prophylaxis w/ nystatin. Could switch to fluconazole as she progresses   # Additional recommendations: - Dose all meds for creatinine clearance < 10 ml/min  - Unless absolutely necessary, no MRIs with gadolinium.  - Implement save arm precautions.  Prefer needle sticks in the dorsum of the hands or wrists.  No blood pressure measurements in arm. - If blood transfusion is requested during hemodialysis sessions, please alert [place] prior to the session.  - Use synthetic opioids (Fentanyl/Dilaudid) if needed   Recommendations were discussed with the primary team.   Recommendations conveyed to primary service.    [facility] 03/01/2022 11:37 AM  ___________________________________________________________  CC: cellulitis  Interval History/Subjective: Peritoneal dialysis overall went fairly well.  Denies any issues such as abdominal pain with dialysis.  Not sure if we got all the fluid off.  Persistent lower  extremity edema.  Pain in leg is improved   Medications:  Current Facility-Administered Medications  Medication Dose Route Frequency Provider Last Rate Last Admin   acetaminophen (TYLENOL) tablet 650 mg  650 mg Oral Q6H PRN Zierle-Ghosh, Asia B, DO       Or   acetaminophen (TYLENOL) suppository 650 mg  650 mg Rectal Q6H PRN Zierle-Ghosh, Asia B, DO       cefTRIAXone (ROCEPHIN) 2 g in sodium chloride 0.9 % 100 mL IVPB  2 g Intravenous Q24H Zierle-Ghosh, Asia B, DO   Stopping previously hung infusion at 02/28/22 1900   Chlorhexidine Gluconate Cloth 2 % PADS 6 each  6 each Topical Q0600 Nita Sells, MD   6 each at 03/01/22 0557   dialysis solution 1.5% low-MG/low-CA dianeal solution   Intraperitoneal Q24H Reesa Chew, MD       dialysis solution 2.5% low-MG/low-CA dianeal solution   Intraperitoneal Q24H Reesa Chew, MD       dialysis solution 4.25% low-MG/low-CA dianeal solution   Intraperitoneal Q24H Reesa Chew, MD       gentamicin cream (GARAMYCIN) 0.1 % 1 Application  1 Application Topical Daily Reesa Chew, MD   1 Application at 29/93/71 1000   heparin injection 5,000 Units  5,000 Units Subcutaneous Q8H Zierle-Ghosh, Asia B, DO   5,000 Units at 03/01/22 0556   midodrine (PROAMATINE) tablet 15 mg  15 mg Oral TID WC Zierle-Ghosh, Asia B, DO   15 mg at 03/01/22 0838   nystatin (MYCOSTATIN) 100000 UNIT/ML suspension 500,000 Units  5 mL Oral TID Reesa Chew, MD   500,000 Units at 03/01/22 1000   ondansetron (ZOFRAN) tablet  4 mg  4 mg Oral Q6H PRN Zierle-Ghosh, Asia B, DO       Or   ondansetron (ZOFRAN) injection 4 mg  4 mg Intravenous Q6H PRN Zierle-Ghosh, Asia B, DO       oxyCODONE (Oxy IR/ROXICODONE) immediate release tablet 5 mg  5 mg Oral Q4H PRN Zierle-Ghosh, Asia B, DO   5 mg at 02/28/22 0933   pantoprazole (PROTONIX) EC tablet 40 mg  40 mg Oral Daily Zierle-Ghosh, Asia B, DO   40 mg at 03/01/22 1000      Review of Systems: 10 systems reviewed and  negative except per interval history/subjective  Physical Exam: Vitals:   03/01/22 0430 03/01/22 0849  BP: (!) 78/56 (!) 80/62  Pulse:  90  Resp: 15 19  Temp:    SpO2: 100% 98%   No intake/output data recorded.  Intake/Output Summary (Last 24 hours) at 03/01/2022 1137 Last data filed at 02/28/2022 2029 Gross per 24 hour  Intake 480 ml  Output --  Net 480 ml   General: well-appearing, no acute distress HEENT: anicteric sclera, MMM CV: normal rate, no murmurs, 2+ pitting edema in the bilateral lower extremities Lungs: bilateral chest rise, normal wob Abd: soft, non-tender, non-distended Skin: Extensive erythema of the left lower extremity, small mass on the left upper shin noted to be hemangioma by patient, otherwise no visible lesions or rashes Psych: alert, engaged, appropriate mood and affect Neuro: normal speech, no gross focal deficits    Test Results I personally reviewed new and old clinical labs and radiology tests Lab Results  Component Value Date   NA 130 (L) 03/01/2022   K 3.7 03/01/2022   CL 93 (L) 03/01/2022   CO2 18 (L) 03/01/2022   BUN 70 (H) 03/01/2022   CREATININE 6.94 (H) 03/01/2022   CALCIUM 7.8 (L) 03/01/2022   ALBUMIN 1.9 (L) 02/28/2022   PHOS 7.5 (H) 02/28/2022    CBC Recent Labs  Lab 02/24/2022 1846 02/28/22 0354 03/01/22 0705  WBC 13.0* 14.3* 13.6*  NEUTROABS 10.4* 11.5*  --   HGB 11.0* 11.9* 11.9*  HCT 32.4* 33.6* 34.5*  MCV 95.6 94.1 94.0  PLT 521* 516* PLATELET CLUMPS NOTED ON SMEAR, UNABLE TO ESTIMATE

## 2022-03-02 DIAGNOSIS — I9589 Other hypotension: Secondary | ICD-10-CM

## 2022-03-02 DIAGNOSIS — L0390 Cellulitis, unspecified: Secondary | ICD-10-CM | POA: Diagnosis not present

## 2022-03-02 DIAGNOSIS — Z992 Dependence on renal dialysis: Secondary | ICD-10-CM | POA: Diagnosis not present

## 2022-03-02 DIAGNOSIS — N186 End stage renal disease: Secondary | ICD-10-CM | POA: Diagnosis not present

## 2022-03-02 DIAGNOSIS — A419 Sepsis, unspecified organism: Secondary | ICD-10-CM | POA: Diagnosis not present

## 2022-03-02 DIAGNOSIS — L03116 Cellulitis of left lower limb: Secondary | ICD-10-CM | POA: Diagnosis not present

## 2022-03-02 LAB — CBC
HCT: 32.1 % — ABNORMAL LOW (ref 36.0–46.0)
Hemoglobin: 11.1 g/dL — ABNORMAL LOW (ref 12.0–15.0)
MCH: 32.2 pg (ref 26.0–34.0)
MCHC: 34.6 g/dL (ref 30.0–36.0)
MCV: 93 fL (ref 80.0–100.0)
Platelets: 484 10*3/uL — ABNORMAL HIGH (ref 150–400)
RBC: 3.45 MIL/uL — ABNORMAL LOW (ref 3.87–5.11)
RDW: 15.8 % — ABNORMAL HIGH (ref 11.5–15.5)
WBC: 13.2 10*3/uL — ABNORMAL HIGH (ref 4.0–10.5)
nRBC: 0 % (ref 0.0–0.2)

## 2022-03-02 MED ORDER — LINEZOLID 600 MG PO TABS
600.0000 mg | ORAL_TABLET | Freq: Two times a day (BID) | ORAL | Status: DC
Start: 2022-03-02 — End: 2022-03-11
  Administered 2022-03-02 – 2022-03-11 (×19): 600 mg via ORAL
  Filled 2022-03-02 (×22): qty 1

## 2022-03-02 NOTE — Progress Notes (Signed)
Mobility Specialist Progress Note   03/02/22 1045  Mobility  Activity Ambulated independently in hallway  Level of Assistance Independent after set-up  Assistive Device None  Distance Ambulated (ft) 550 ft  Activity Response Tolerated well  $Mobility charge 1 Mobility   Received pt in chair stating to be in a constant 7/10 pain in LLE but was agreeable to mobility. Pt requiring no physical assistance prior and during session, asymptomatic throughout ambulation and returned to room w/o fault. Left back in chair w/ call bell in reach and all needs met.  Holland Falling Mobility Specialist Phone Number [phone]

## 2022-03-02 NOTE — Plan of Care (Signed)
  Problem: Clinical Measurements: Goal: Respiratory complications will improve Outcome: Progressing   Problem: Activity: Goal: Risk for activity intolerance will decrease Outcome: Progressing   Problem: Nutrition: Goal: Adequate nutrition will be maintained Outcome: Progressing   Problem: Coping: Goal: Level of anxiety will decrease Outcome: Progressing   Problem: Pain Managment: Goal: General experience of comfort will improve Outcome: Progressing   Problem: Respiratory: Goal: Ability to maintain adequate ventilation will improve Outcome: Progressing

## 2022-03-02 NOTE — Progress Notes (Signed)
PROGRESS NOTE    Lauren Osborn  WGY:[id number] DOB: 06-02-1976 DOA: 02/02/2022 PCP: Carrolyn Meiers, MD    Brief Narrative:  Lauren Osborn is a 46 y.o. female with medical history significant of ESRD on peritoneal dialysis, diastolic congestive heart failure, essential hypertension, GERD, liver hemangioma, morbid obesity, thyroid nodule, and more presents to ED with a chief complaint of left leg pain.    Assessment and Plan: Sepsis due to cellulitis [facility]) - Patient had a heart rate of 92, respiratory rate of 23, leukocytosis of 13, blood pressure as low as 78/50, lactic acidosis 2.0 -Failed outpatient treatment for cellulitis with cefdinir and Doxy -Chest x-ray shows no cardiopulmonary disease -X-ray tib-fib shows extensive subcutaneous edema.  No subcutaneous gas identified.  No evidence of osteomyelitis. -Continue Rocephin but may need escalation of abx-- ID consult -Blood cultures NGTD -Continue to monitor -WOC consult appreciated    ESRD (end stage renal disease) ([place]) - ESRD since December 22 -Was initially on HD with a chest port, left AV fistula in place -Currently doing peritoneal dialysis at home -Nephrology  Hypotension - Chronic hypertension with patient and family reporting her blood pressures never above 100 -Continue midodrine dose from home 15 mg 3 times daily  GERD (gastroesophageal reflux disease) - Continue Protonix  Hyponatremia -probably from volume overload -labs in AM  Hypomagnesemia -repleted    DVT prophylaxis: heparin injection 5,000 Units Start: 02/28/22 0600 SCDs Start: 02/28/22 0245    Code Status: Full Code Family Communication:   Disposition Plan:  Level of care: Telemetry Medical Status is: Inpatient Remains inpatient appropriate because: needs IV abx and wound care    Consultants:  Nephrology ID   Subjective: Still c/o severe pain  Objective: Vitals:   03/01/22 1651 03/01/22 2117 03/02/22 0453 03/02/22 0829   BP: (!) 88/58 (!) 78/42 (!) 75/47 102/81  Pulse: 85 87 89 87  Resp: '16 16 18 17  '$ Temp: 98.6 F (37 C) 98.3 F (36.8 C)  97.6 F (36.4 C)  TempSrc: Oral   Oral  SpO2: 100% 97% 100%   Weight:      Height:        Intake/Output Summary (Last 24 hours) at 03/02/2022 1207 Last data filed at 03/02/2022 7793 Gross per 24 hour  Intake 392.18 ml  Output --  Net 392.18 ml   Filed Weights   02/14/2022 1754  Weight: 89.4 kg    Examination:    General: Appearance:    Obese female in no acute distress     Lungs:     respirations unlabored  Heart:    Normal heart rate.       Neurologic:   Awake, alert, oriented x 3. No apparent focal neurological           defect.        Data Reviewed: I have personally reviewed following labs and imaging studies  CBC: Recent Labs  Lab 02/12/2022 1846 02/28/22 0354 03/01/22 0705 03/02/22 0733  WBC 13.0* 14.3* 13.6* 13.2*  NEUTROABS 10.4* 11.5*  --   --   HGB 11.0* 11.9* 11.9* 11.1*  HCT 32.4* 33.6* 34.5* 32.1*  MCV 95.6 94.1 94.0 93.0  PLT 521* 516* PLATELET CLUMPS NOTED ON SMEAR, UNABLE TO ESTIMATE 903*   Basic Metabolic Panel: Recent Labs  Lab 02/23/2022 1846 02/28/22 0354 03/01/22 0705  NA 129* 133* 130*  K 3.5 3.6 3.7  CL 96* 99 93*  CO2 19* 16* 18*  GLUCOSE 88 79 100*  BUN  62* 64* 70*  CREATININE 6.23* 6.68* 6.94*  CALCIUM 6.8* 7.4* 7.8*  MG  --  1.0* 1.8  PHOS  --  7.5*  --    GFR: Estimated Creatinine Clearance: 10.6 mL/min (A) (by C-G formula based on SCr of 6.94 mg/dL (H)). Liver Function Tests: Recent Labs  Lab 02/28/22 0354  AST 63*  ALT 31  ALKPHOS 274*  BILITOT 1.1  PROT 5.7*  ALBUMIN 1.9*   No results for input(s): "LIPASE", "AMYLASE" in the last 168 hours. No results for input(s): "AMMONIA" in the last 168 hours. Coagulation Profile: Recent Labs  Lab 03/02/2022 1846 02/28/22 0354  INR 1.3* 1.3*   Cardiac Enzymes: No results for input(s): "CKTOTAL", "CKMB", "CKMBINDEX", "TROPONINI" in the last 168  hours. BNP (last 3 results) No results for input(s): "PROBNP" in the last 8760 hours. HbA1C: No results for input(s): "HGBA1C" in the last 72 hours. CBG: No results for input(s): "GLUCAP" in the last 168 hours. Lipid Profile: No results for input(s): "CHOL", "HDL", "LDLCALC", "TRIG", "CHOLHDL", "LDLDIRECT" in the last 72 hours. Thyroid Function Tests: No results for input(s): "TSH", "T4TOTAL", "FREET4", "T3FREE", "THYROIDAB" in the last 72 hours. Anemia Panel: No results for input(s): "VITAMINB12", "FOLATE", "FERRITIN", "TIBC", "IRON", "RETICCTPCT" in the last 72 hours. Sepsis Labs: Recent Labs  Lab 02/02/2022 1851 02/09/2022 2024 02/28/22 0354  PROCALCITON  --   --  1.70  LATICACIDVEN 1.3 2.0* 2.6*    Recent Results (from the past 240 hour(s))  Blood Culture (routine x 2)     Status: None (Preliminary result)   Collection Time: 02/12/2022  6:51 PM   Specimen: Right Antecubital; Blood  Result Value Ref Range Status   Specimen Description RIGHT ANTECUBITAL  Final   Special Requests   Final    BOTTLES DRAWN AEROBIC AND ANAEROBIC Blood Culture adequate volume   Culture   Final    NO GROWTH 3 DAYS Performed at [facility], [address] [place], [place] [address]    Report Status PENDING  Incomplete  Blood Culture (routine x 2)     Status: None (Preliminary result)   Collection Time: 03/02/2022  8:24 PM   Specimen: BLOOD RIGHT HAND  Result Value Ref Range Status   Specimen Description BLOOD RIGHT HAND  Final   Special Requests   Final    BOTTLES DRAWN AEROBIC ONLY Blood Culture adequate volume   Culture   Final    NO GROWTH 3 DAYS Performed at [facility], [address] [place], [place] [address]    Report Status PENDING  Incomplete  Body fluid culture w Gram Stain     Status: None (Preliminary result)   Collection Time: 02/28/22  6:59 PM   Specimen: Peritoneal Washings; Body Fluid  Result Value Ref Range Status   Specimen Description PERITONEAL  Final   Special Requests  Normal  Final   Gram Stain   Final    RARE WBC PRESENT, PREDOMINANTLY MONONUCLEAR NO ORGANISMS SEEN    Culture   Final    NO GROWTH 2 DAYS Performed at [facility], [address] [place], [place] [address]    Report Status PENDING  Incomplete         Radiology Studies: No results found.      Scheduled Meds:  Chlorhexidine Gluconate Cloth  6 each Topical Q0600   gentamicin cream  1 Application Topical Daily   heparin  5,000 Units Subcutaneous Q8H   midodrine  15 mg Oral TID WC  nystatin  5 mL Oral TID   pantoprazole  40 mg Oral Daily   Continuous Infusions:  cefTRIAXone (ROCEPHIN)  IV Stopped (03/01/22 1703)   dialysis solution 1.5% low-MG/low-CA     dialysis solution 2.5% low-MG/low-CA     dialysis solution 4.25% low-MG/low-CA       LOS: 2 days    Time spent: 45 minutes spent on chart review, discussion with nursing staff, consultants, updating family and interview/physical exam; more than 50% of that time was spent in counseling and/or coordination of care.    Geradine Girt, DO [facility] Available via Epic secure chat 7am-7pm After these hours, please refer to coverage provider listed on [email or web address] 03/02/2022, 12:07 PM

## 2022-03-02 NOTE — Progress Notes (Signed)
Nephrology Follow-Up Consult note   Assessment/Recommendations: Lauren Osborn is a/an 46 y.o. female with a past medical history significant for ESRD on HD admitted with cellulitis.    # ESRD: Continue 4 exchanges w/ 2.5L fill volume.  Continue with added one 4.25% bag to help with volume removal, otherwise 2.5% bags. Total of 8hrs. No day dwell   # Volume/ hypotension: Appears total body volume overloaded w/ LEE. Some is chronic. Hypotension also chronic per patient on midodrine. PD as above.    # Anemia of Chronic Kidney Disease: Hemoglobin at goal.  No ESA   # Secondary Hyperparathyroidism/Hyperphosphatemia: Phos 7.5 and calcium slightly low but corrects to normal when accounting for albumin. Phos may improve with PD.    #Hypomagnesemia: now improved  #Hyponatremia: Possibly related to fluid excess.  PD as above   #Anion gap metabolic acidosis: Associated with missing dialysis.  Has improved   #Milky effluent: Chronic issue.  PD fluid negative for infection   #Cellulitis: Antibiotics per primary team.  Will provide fungal prophylaxis w/ nystatin. Could switch to fluconazole as she progresses   # Additional recommendations: - Dose all meds for creatinine clearance < 10 ml/min  - Unless absolutely necessary, no MRIs with gadolinium.  - Implement save arm precautions.  Prefer needle sticks in the dorsum of the hands or wrists.  No blood pressure measurements in arm. - If blood transfusion is requested during hemodialysis sessions, please alert [place] prior to the session.  - Use synthetic opioids (Fentanyl/Dilaudid) if needed   Recommendations were discussed with the primary team.   Recommendations conveyed to primary service.    [facility] 03/02/2022 11:21 AM  ___________________________________________________________  CC: cellulitis  Interval History/Subjective: Patient tolerated dialysis yesterday with no issues.  Total of 4 L  ultrafiltration.  Walking around more today.  Pain in the left lower extremity worse today.   Medications:  Current Facility-Administered Medications  Medication Dose Route Frequency Provider Last Rate Last Admin   acetaminophen (TYLENOL) tablet 650 mg  650 mg Oral Q6H PRN Zierle-Ghosh, Asia B, DO       Or   acetaminophen (TYLENOL) suppository 650 mg  650 mg Rectal Q6H PRN Zierle-Ghosh, Asia B, DO       cefTRIAXone (ROCEPHIN) 2 g in sodium chloride 0.9 % 100 mL IVPB  2 g Intravenous Q24H Zierle-Ghosh, Asia B, DO   Paused at 03/01/22 1703   Chlorhexidine Gluconate Cloth 2 % PADS 6 each  6 each Topical Q0600 Nita Sells, MD   6 each at 03/02/22 0616   dialysis solution 1.5% low-MG/low-CA dianeal solution   Intraperitoneal Q24H Reesa Chew, MD       dialysis solution 2.5% low-MG/low-CA dianeal solution   Intraperitoneal Q24H Reesa Chew, MD       dialysis solution 4.25% low-MG/low-CA dianeal solution   Intraperitoneal Q24H Reesa Chew, MD       gentamicin cream (GARAMYCIN) 0.1 % 1 Application  1 Application Topical Daily Reesa Chew, MD   1 Application at 25/63/89 1000   heparin injection 5,000 Units  5,000 Units Subcutaneous Q8H Zierle-Ghosh, Asia B, DO   5,000 Units at 03/01/22 2059   midodrine (PROAMATINE) tablet 15 mg  15 mg Oral TID WC Zierle-Ghosh, Asia B, DO   15 mg at 03/01/22 1700   nystatin (MYCOSTATIN) 100000 UNIT/ML suspension 500,000 Units  5 mL Oral TID Reesa Chew, MD   500,000 Units at 03/01/22 2058   ondansetron (  ZOFRAN) tablet 4 mg  4 mg Oral Q6H PRN Zierle-Ghosh, Asia B, DO       Or   ondansetron (ZOFRAN) injection 4 mg  4 mg Intravenous Q6H PRN Zierle-Ghosh, Asia B, DO       oxyCODONE (Oxy IR/ROXICODONE) immediate release tablet 5 mg  5 mg Oral Q4H PRN Zierle-Ghosh, Asia B, DO   5 mg at 03/01/22 2059   pantoprazole (PROTONIX) EC tablet 40 mg  40 mg Oral Daily Zierle-Ghosh, Asia B, DO   40 mg at 03/01/22 1000      Review of Systems: 10  systems reviewed and negative except per interval history/subjective  Physical Exam: Vitals:   03/02/22 0453 03/02/22 0829  BP: (!) 75/47 102/81  Pulse: 89 87  Resp: 18 17  Temp:  97.6 F (36.4 C)  SpO2: 100%    No intake/output data recorded.  Intake/Output Summary (Last 24 hours) at 03/02/2022 1121 Last data filed at 03/02/2022 5929 Gross per 24 hour  Intake 632.18 ml  Output --  Net 632.18 ml   General: well-appearing, no acute distress HEENT: anicteric sclera, MMM CV: normal rate, no murmurs, 2+ pitting edema in the bilateral lower extremities Lungs: bilateral chest rise, normal wob Abd: soft, non-tender, non-distended Skin: Persistent erythema of the left lower extremity Psych: alert, engaged, appropriate mood and affect Neuro: normal speech, no gross focal deficits    Test Results I personally reviewed new and old clinical labs and radiology tests Lab Results  Component Value Date   NA 130 (L) 03/01/2022   K 3.7 03/01/2022   CL 93 (L) 03/01/2022   CO2 18 (L) 03/01/2022   BUN 70 (H) 03/01/2022   CREATININE 6.94 (H) 03/01/2022   CALCIUM 7.8 (L) 03/01/2022   ALBUMIN 1.9 (L) 02/28/2022   PHOS 7.5 (H) 02/28/2022    CBC Recent Labs  Lab 02/16/2022 1846 02/28/22 0354 03/01/22 0705 03/02/22 0733  WBC 13.0* 14.3* 13.6* 13.2*  NEUTROABS 10.4* 11.5*  --   --   HGB 11.0* 11.9* 11.9* 11.1*  HCT 32.4* 33.6* 34.5* 32.1*  MCV 95.6 94.1 94.0 93.0  PLT 521* 516* PLATELET CLUMPS NOTED ON SMEAR, UNABLE TO ESTIMATE 484*

## 2022-03-02 NOTE — Progress Notes (Signed)
Mobility Specialist Progress Note   03/02/22 1742  Mobility  Activity Ambulated independently in hallway  Level of Assistance Independent after set-up  Assistive Device None  Distance Ambulated (ft) 550 ft  Activity Response Tolerated well  $Mobility charge 1 Mobility   Received pt in hallway having no complaints and agreeable to mobility. Pt was asymptomatic throughout ambulation and returned to room w/o fault. Left in bed w/ call bell in reach and all needs met.  Holland Falling Mobility Specialist Phone Number [phone]

## 2022-03-02 NOTE — Consult Note (Signed)
[facility] for Infectious Disease    Date of Admission:  02/26/2022     Reason for Consult: cellulitis    Referring Provider: Eliseo Squires  Abx: 6/26-c Ceftriaxone  6/20-26 doxy/cefdinir       Assessment: 46 y.o. female hemangioma, esrd on home peritoneal dialysis (also has chest port and left avf), chronic hypotension on midodrine, admitted for 2 weeks of left LE cellulitis that hasn't responded to cefdinir/doxy   Clinically appear to be group a strep cellulitis/impetigo. Lacking history/characteristic of nec fasc. Other consideration is bullous impetigo from staph aureus  Seems to be improving since admission at least in terms of swelling and thrombocytosis. The impetigo changes could take longer to self resolve     Plan: Blisters broken and sent fluid swab for culture Change abx to linezolid If continued improvement in another 2 days could discharge from id standpoint Discussed with primary team    I spent 70 minute reviewing data/chart, and coordinating care and >50% direct face to face time providing counseling/discussing diagnostics/treatment plan with patient       ------------------------------------------------ Principal Problem:   Sepsis due to cellulitis ([place]) Active Problems:   GERD (gastroesophageal reflux disease)   Hypotension   ESRD (end stage renal disease) ([place])   Cellulitis    HPI: Lauren Osborn is a 46 y.o. female hemangioma, esrd on home peritoneal dialysis (also has chest port and left avf), chronic hypotension on midodrine, admitted for 2 weeks of left LE cellulitis that hasn't responded to cefdinir/doxy  Sx onset redness/pain 2 weeks prior, there was a blister since onset that had now ruptured  No fever, chill  A week prior to admission given doxy/cefdinir by urgtent care, but didn't seem to help much in terms of pain. Redness is not worse. She noticed 2 new blisters a few days prior to admission  Doesn't feel unwell in other  ways  On admission; Afebrile Xray no SubQ gas, slight elevation lactate 2-2.6. wbc stable from a week prior to admission at around 13 Started on ceftriaxone  Improved swelling Pain stable Blistering process stable; 1 had ruptured  No n/v/diarrhea Remains afebrile Wbc stable 13's; improving reactive thrombocytosis   Family History  Problem Relation Age of Onset   Breast cancer Mother    Other Paternal Grandfather        house fire   Other Maternal Grandmother        house fire   Congestive Heart Failure Maternal Grandfather    Colon cancer Father 56   Diverticulitis Brother    Diverticulitis Sister    Colon polyps Sister 69   Diabetes Daughter        borderline    Social History   Tobacco Use   Smoking status: Former    Packs/day: 0.50    Years: 20.00    Total pack years: 10.00    Types: Cigarettes    Quit date: 2017    Years since quitting: 6.4    Passive exposure: Never   Smokeless tobacco: Never  Vaping Use   Vaping Use: Never used  Substance Use Topics   Alcohol use: Not Currently   Drug use: Never    Allergies  Allergen Reactions   Oxycodone-Acetaminophen Hives and Other (See Comments)    Review of Systems: ROS All Other ROS was negative, except mentioned above   Past Medical History:  Diagnosis Date   Abnormal bilirubin test    Cholelithiasis    Chronic  kidney disease, stage 3b (HCC)    Diastolic congestive heart failure (HCC)    Esophagitis    Essential hypertension    Fibroids    Gastritis    GERD (gastroesophageal reflux disease)    Gout    Liver hemangioma    Morbid obesity (HCC)    Normocytic anemia    OSA (obstructive sleep apnea)    Thyroid nodule    Tubular adenoma        Scheduled Meds:  Chlorhexidine Gluconate Cloth  6 each Topical Q0600   gentamicin cream  1 Application Topical Daily   heparin  5,000 Units Subcutaneous Q8H   midodrine  15 mg Oral TID WC   nystatin  5 mL Oral TID   pantoprazole  40 mg Oral Daily    Continuous Infusions:  cefTRIAXone (ROCEPHIN)  IV Stopped (03/01/22 1703)   dialysis solution 1.5% low-MG/low-CA     dialysis solution 2.5% low-MG/low-CA     dialysis solution 4.25% low-MG/low-CA     PRN Meds:.acetaminophen **OR** acetaminophen, ondansetron **OR** ondansetron (ZOFRAN) IV, oxyCODONE   OBJECTIVE: Blood pressure 102/81, pulse 87, temperature 97.6 F (36.4 C), temperature source Oral, resp. rate 17, height '5\' 3"'$  (1.6 m), weight 89.4 kg, SpO2 100 %.  Physical Exam General/constitutional: no distress, pleasant; sitting in chair; conversant; purple hair HEENT: Normocephalic, PER, Conj Clear, EOMI, Oropharynx clear Neck supple CV: rrr no mrg Lungs: clear to auscultation, normal respiratory effort Abd: Soft, Nontender Ext/skin; dusky and erythematous changes anteriomedial left shin with 2 clear fluid blister and 1 ruptured blister; tender to touch; wrinkling of skin seen (patient reports improved swelling); left anterior knee at tibial tuberosity is a hemangioma that is nontender. Neuro: nonfocal MSK: no peripheral joint swelling/tenderness/warmth; back spines nontender  Right chest hd cath site no purulence/tenderness/erythema  Lab Results Lab Results  Component Value Date   WBC 13.2 (H) 03/02/2022   HGB 11.1 (L) 03/02/2022   HCT 32.1 (L) 03/02/2022   MCV 93.0 03/02/2022   PLT 484 (H) 03/02/2022    Lab Results  Component Value Date   CREATININE 6.94 (H) 03/01/2022   BUN 70 (H) 03/01/2022   NA 130 (L) 03/01/2022   K 3.7 03/01/2022   CL 93 (L) 03/01/2022   CO2 18 (L) 03/01/2022    Lab Results  Component Value Date   ALT 31 02/28/2022   AST 63 (H) 02/28/2022   ALKPHOS 274 (H) 02/28/2022   BILITOT 1.1 02/28/2022      Microbiology: Recent Results (from the past 240 hour(s))  Blood Culture (routine x 2)     Status: None (Preliminary result)   Collection Time: 02/28/2022  6:51 PM   Specimen: Right Antecubital; Blood  Result Value Ref Range Status    Specimen Description RIGHT ANTECUBITAL  Final   Special Requests   Final    BOTTLES DRAWN AEROBIC AND ANAEROBIC Blood Culture adequate volume   Culture   Final    NO GROWTH 3 DAYS Performed at [facility], [address], [place], [place] [address]    Report Status PENDING  Incomplete  Blood Culture (routine x 2)     Status: None (Preliminary result)   Collection Time: 02/28/2022  8:24 PM   Specimen: BLOOD RIGHT HAND  Result Value Ref Range Status   Specimen Description BLOOD RIGHT HAND  Final   Special Requests   Final    BOTTLES DRAWN AEROBIC ONLY Blood Culture adequate volume   Culture   Final    NO GROWTH  3 DAYS Performed at [facility], [address], [place], [place] [address]    Report Status PENDING  Incomplete  Body fluid culture w Gram Stain     Status: None (Preliminary result)   Collection Time: 02/28/22  6:59 PM   Specimen: Peritoneal Washings; Body Fluid  Result Value Ref Range Status   Specimen Description PERITONEAL  Final   Special Requests Normal  Final   Gram Stain   Final    RARE WBC PRESENT, PREDOMINANTLY MONONUCLEAR NO ORGANISMS SEEN    Culture   Final    NO GROWTH 2 DAYS Performed at [facility], [address], [place], [place] [address]    Report Status PENDING  Incomplete     Serology:    Imaging: If present, new imagings (plain films, ct scans, and mri) have been personally visualized and interpreted; radiology reports have been reviewed. Decision making incorporated into the Impression / Recommendations.  6/26 xray left tibfib Extensive subcutaneous edema. No subcutaneous gas identified. No radiographic evidence of osteomyelitis.  Jabier Mutton, [facility] Infectious Disease [place] [phone] pager    03/02/2022, 12:17 PM

## 2022-03-02 NOTE — Plan of Care (Signed)
  Problem: Education: Goal: Knowledge of General Education information will improve Description: Including pain rating scale, medication(s)/side effects and non-pharmacologic comfort measures Outcome: Not Progressing   Problem: Health Behavior/Discharge Planning: Goal: Ability to manage health-related needs will improve Outcome: Not Progressing   Problem: Clinical Measurements: Goal: Ability to maintain clinical measurements within normal limits will improve Outcome: Not Progressing Goal: Will remain free from infection Outcome: Not Progressing Goal: Diagnostic test results will improve Outcome: Not Progressing Goal: Respiratory complications will improve Outcome: Not Progressing Goal: Cardiovascular complication will be avoided Outcome: Not Progressing   Problem: Activity: Goal: Risk for activity intolerance will decrease Outcome: Not Progressing   Problem: Nutrition: Goal: Adequate nutrition will be maintained Outcome: Not Progressing   Problem: Coping: Goal: Level of anxiety will decrease Outcome: Not Progressing   Problem: Elimination: Goal: Will not experience complications related to bowel motility Outcome: Not Progressing Goal: Will not experience complications related to urinary retention Outcome: Not Progressing   Problem: Respiratory: Goal: Ability to maintain adequate ventilation will improve Outcome: Not Progressing   Problem: Clinical Measurements: Goal: Diagnostic test results will improve Outcome: Not Progressing Goal: Signs and symptoms of infection will decrease Outcome: Not Progressing   Problem: Fluid Volume: Goal: Hemodynamic stability will improve Outcome: Not Progressing

## 2022-03-02 NOTE — Progress Notes (Signed)
PD tx completed. Pt tolerated tx well. Pt d/c from pd using aseptic technique. Dressing changed. No s/s of complications. Pt denies pain/discomfort. Total uf: 417m Initial drain volume: 1231 Total therapy time: 11hr 141ms

## 2022-03-03 ENCOUNTER — Other Ambulatory Visit (HOSPITAL_COMMUNITY): Payer: Self-pay

## 2022-03-03 DIAGNOSIS — L0390 Cellulitis, unspecified: Secondary | ICD-10-CM | POA: Diagnosis not present

## 2022-03-03 DIAGNOSIS — L03116 Cellulitis of left lower limb: Secondary | ICD-10-CM | POA: Diagnosis not present

## 2022-03-03 DIAGNOSIS — N186 End stage renal disease: Secondary | ICD-10-CM | POA: Diagnosis not present

## 2022-03-03 DIAGNOSIS — A419 Sepsis, unspecified organism: Secondary | ICD-10-CM | POA: Diagnosis not present

## 2022-03-03 DIAGNOSIS — Z992 Dependence on renal dialysis: Secondary | ICD-10-CM | POA: Diagnosis not present

## 2022-03-03 LAB — RENAL FUNCTION PANEL
Albumin: 1.5 g/dL — ABNORMAL LOW (ref 3.5–5.0)
Anion gap: 19 — ABNORMAL HIGH (ref 5–15)
BUN: 70 mg/dL — ABNORMAL HIGH (ref 6–20)
CO2: 14 mmol/L — ABNORMAL LOW (ref 22–32)
Calcium: 7.5 mg/dL — ABNORMAL LOW (ref 8.9–10.3)
Chloride: 92 mmol/L — ABNORMAL LOW (ref 98–111)
Creatinine, Ser: 7.1 mg/dL — ABNORMAL HIGH (ref 0.44–1.00)
GFR, Estimated: 7 mL/min — ABNORMAL LOW (ref >60)
Glucose, Bld: 105 mg/dL — ABNORMAL HIGH (ref 70–99)
Phosphorus: 8.5 mg/dL — ABNORMAL HIGH (ref 2.5–4.6)
Potassium: 3.5 mmol/L (ref 3.5–5.1)
Sodium: 125 mmol/L — ABNORMAL LOW (ref 135–145)

## 2022-03-03 LAB — CBC
HCT: 31.7 % — ABNORMAL LOW (ref 36.0–46.0)
Hemoglobin: 10.8 g/dL — ABNORMAL LOW (ref 12.0–15.0)
MCH: 32.4 pg (ref 26.0–34.0)
MCHC: 34.1 g/dL (ref 30.0–36.0)
MCV: 95.2 fL (ref 80.0–100.0)
Platelets: 417 10*3/uL — ABNORMAL HIGH (ref 150–400)
RBC: 3.33 MIL/uL — ABNORMAL LOW (ref 3.87–5.11)
RDW: 15.8 % — ABNORMAL HIGH (ref 11.5–15.5)
WBC: 12.6 10*3/uL — ABNORMAL HIGH (ref 4.0–10.5)
nRBC: 0 % (ref 0.0–0.2)

## 2022-03-03 LAB — MAGNESIUM: Magnesium: 1.6 mg/dL — ABNORMAL LOW (ref 1.7–2.4)

## 2022-03-03 MED ORDER — LINEZOLID 600 MG PO TABS
600.0000 mg | ORAL_TABLET | Freq: Two times a day (BID) | ORAL | 0 refills | Status: AC
  Filled 2022-03-03: qty 20, 10d supply, fill #0

## 2022-03-03 MED ORDER — SODIUM BICARBONATE 650 MG PO TABS
1300.0000 mg | ORAL_TABLET | Freq: Two times a day (BID) | ORAL | Status: DC
  Administered 2022-03-03 – 2022-03-12 (×19): 1300 mg via ORAL
  Filled 2022-03-03 (×19): qty 2

## 2022-03-03 MED ORDER — MAGNESIUM SULFATE 2 GM/50ML IV SOLN
2.0000 g | Freq: Once | INTRAVENOUS | Status: AC
Start: 2022-03-03 — End: 2022-03-03
  Administered 2022-03-03: 2 g via INTRAVENOUS
  Filled 2022-03-03: qty 50

## 2022-03-03 MED ORDER — SEVELAMER CARBONATE 800 MG PO TABS
800.0000 mg | ORAL_TABLET | Freq: Three times a day (TID) | ORAL | Status: DC
Start: 2022-03-03 — End: 2022-03-05
  Administered 2022-03-03 – 2022-03-04 (×4): 800 mg via ORAL
  Filled 2022-03-03 (×5): qty 1

## 2022-03-03 NOTE — Progress Notes (Signed)
PROGRESS NOTE    Lauren Osborn  NAT:[id number] DOB: [birth year] DOA: 02/06/2022 PCP: Carrolyn Meiers, MD    Brief Narrative:  Lauren Osborn is a 46 y.o. female with medical history significant of ESRD on peritoneal dialysis, diastolic congestive heart failure, essential hypertension, GERD, liver hemangioma, morbid obesity, thyroid nodule, and more presents to ED with a chief complaint of left leg pain.    Assessment and Plan: Sepsis due to cellulitis [facility]) - Patient had a heart rate of 92, respiratory rate of 23, leukocytosis of 13, blood pressure as low as 78/50, lactic acidosis 2.0 -Failed outpatient treatment for cellulitis with cefdinir and Doxy -Chest x-ray shows no cardiopulmonary disease -X-ray tib-fib shows extensive subcutaneous edema.  No subcutaneous gas identified.  No evidence of osteomyelitis. - ID consult zyvox: appear to be group a strep cellulitis/impetigo; plan for 10 days -Blood cultures NGTD -Continue to monitor -WOC consult appreciated  ESRD (end stage renal disease) ([place]) - ESRD since December 22 -Was initially on HD with a chest port, left AV fistula in place -Currently doing peritoneal dialysis at home -Nephrology  Hypotension - Chronic hypertension with patient and family reporting her blood pressures never above 100 -Continue midodrine dose from home 15 mg 3 times daily  GERD (gastroesophageal reflux disease) - Continue Protonix  Hyponatremia -probably from volume overload  Hypomagnesemia -repleted    DVT prophylaxis: heparin injection 5,000 Units Start: 02/28/22 0600 SCDs Start: 02/28/22 0245    Code Status: Full Code Family Communication:   Disposition Plan:  Level of care: Telemetry Medical Status is: Inpatient Remains inpatient appropriate because: home in AM?    Consultants:  Nephrology ID   Subjective: Pain only slightly better  Objective: Vitals:   03/02/22 1525 03/02/22 1946 03/03/22 0407 03/03/22 0809  BP:  (!) 70/66 (!) 82/52 (!) 81/49 (!) 71/53  Pulse: 82 83 82 80  Resp:  '17 19 16  '$ Temp: (!) 97.4 F (36.3 C) 98.3 F (36.8 C) 98 F (36.7 C) 98 F (36.7 C)  TempSrc: Oral Oral  Oral  SpO2: 97% 100% 99% 100%  Weight:      Height:        Intake/Output Summary (Last 24 hours) at 03/03/2022 1304 Last data filed at 03/02/2022 2032 Gross per 24 hour  Intake 120 ml  Output --  Net 120 ml   Filed Weights   02/09/2022 1754  Weight: 89.4 kg    Examination:    General: Appearance:    Obese female in no acute distress     Lungs:     respirations unlabored  Heart:    Normal heart rate.    Leg wrapped today   Neurologic:   Awake, alert, oriented x 3. No apparent focal neurological           defect.        Data Reviewed: I have personally reviewed following labs and imaging studies  CBC: Recent Labs  Lab 02/18/2022 1846 02/28/22 0354 03/01/22 0705 03/02/22 0733 03/03/22 0209  WBC 13.0* 14.3* 13.6* 13.2* 12.6*  NEUTROABS 10.4* 11.5*  --   --   --   HGB 11.0* 11.9* 11.9* 11.1* 10.8*  HCT 32.4* 33.6* 34.5* 32.1* 31.7*  MCV 95.6 94.1 94.0 93.0 95.2  PLT 521* 516* PLATELET CLUMPS NOTED ON SMEAR, UNABLE TO ESTIMATE 484* 427*   Basic Metabolic Panel: Recent Labs  Lab 02/19/2022 1846 02/28/22 0354 03/01/22 0705 03/03/22 0209  NA 129* 133* 130* 125*  K 3.5 3.6 3.7  3.5  CL 96* 99 93* 92*  CO2 19* 16* 18* 14*  GLUCOSE 88 79 100* 105*  BUN 62* 64* 70* 70*  CREATININE 6.23* 6.68* 6.94* 7.10*  CALCIUM 6.8* 7.4* 7.8* 7.5*  MG  --  1.0* 1.8 1.6*  PHOS  --  7.5*  --  8.5*   GFR: Estimated Creatinine Clearance: 10.4 mL/min (A) (by C-G formula based on SCr of 7.1 mg/dL (H)). Liver Function Tests: Recent Labs  Lab 02/28/22 0354 03/03/22 0209  AST 63*  --   ALT 31  --   ALKPHOS 274*  --   BILITOT 1.1  --   PROT 5.7*  --   ALBUMIN 1.9* 1.5*   No results for input(s): "LIPASE", "AMYLASE" in the last 168 hours. No results for input(s): "AMMONIA" in the last 168  hours. Coagulation Profile: Recent Labs  Lab 02/22/2022 1846 02/28/22 0354  INR 1.3* 1.3*   Cardiac Enzymes: No results for input(s): "CKTOTAL", "CKMB", "CKMBINDEX", "TROPONINI" in the last 168 hours. BNP (last 3 results) No results for input(s): "PROBNP" in the last 8760 hours. HbA1C: No results for input(s): "HGBA1C" in the last 72 hours. CBG: No results for input(s): "GLUCAP" in the last 168 hours. Lipid Profile: No results for input(s): "CHOL", "HDL", "LDLCALC", "TRIG", "CHOLHDL", "LDLDIRECT" in the last 72 hours. Thyroid Function Tests: No results for input(s): "TSH", "T4TOTAL", "FREET4", "T3FREE", "THYROIDAB" in the last 72 hours. Anemia Panel: No results for input(s): "VITAMINB12", "FOLATE", "FERRITIN", "TIBC", "IRON", "RETICCTPCT" in the last 72 hours. Sepsis Labs: Recent Labs  Lab 02/10/2022 1851 02/02/2022 2024 02/28/22 0354  PROCALCITON  --   --  1.70  LATICACIDVEN 1.3 2.0* 2.6*    Recent Results (from the past 240 hour(s))  Blood Culture (routine x 2)     Status: None (Preliminary result)   Collection Time: 03/02/2022  6:51 PM   Specimen: Right Antecubital; Blood  Result Value Ref Range Status   Specimen Description RIGHT ANTECUBITAL  Final   Special Requests   Final    BOTTLES DRAWN AEROBIC AND ANAEROBIC Blood Culture adequate volume   Culture   Final    NO GROWTH 4 DAYS Performed at [facility], [address] [place], [place] [address]    Report Status PENDING  Incomplete  Blood Culture (routine x 2)     Status: None (Preliminary result)   Collection Time: 02/19/2022  8:24 PM   Specimen: BLOOD RIGHT HAND  Result Value Ref Range Status   Specimen Description BLOOD RIGHT HAND  Final   Special Requests   Final    BOTTLES DRAWN AEROBIC ONLY Blood Culture adequate volume   Culture   Final    NO GROWTH 4 DAYS Performed at [facility], [address], [place], [place] [address]    Report Status PENDING  Incomplete  Body fluid culture w Gram Stain     Status:  None (Preliminary result)   Collection Time: 02/28/22  6:59 PM   Specimen: Peritoneal Washings; Body Fluid  Result Value Ref Range Status   Specimen Description PERITONEAL  Final   Special Requests Normal  Final   Gram Stain   Final    RARE WBC PRESENT, PREDOMINANTLY MONONUCLEAR NO ORGANISMS SEEN    Culture   Final    NO GROWTH 3 DAYS Performed at [facility], [address], [place], [place] [address]    Report Status PENDING  Incomplete  Aerobic/Anaerobic Culture w Gram Stain (surgical/deep wound)     Status:  None (Preliminary result)   Collection Time: 03/02/22 12:15 PM   Specimen: Wound  Result Value Ref Range Status   Specimen Description WOUND  Final   Special Requests LEFT LEG  Final   Gram Stain   Final    NO SQUAMOUS EPITHELIAL CELLS SEEN NO WBC SEEN NO ORGANISMS SEEN    Culture   Final    NO GROWTH < 24 HOURS Performed at [facility], [address] [place], [place] [address]    Report Status PENDING  Incomplete         Radiology Studies: No results found.      Scheduled Meds:  Chlorhexidine Gluconate Cloth  6 each Topical Q0600   gentamicin cream  1 Application Topical Daily   heparin  5,000 Units Subcutaneous Q8H   linezolid  600 mg Oral Q12H   midodrine  15 mg Oral TID WC   nystatin  5 mL Oral TID   pantoprazole  40 mg Oral Daily   sevelamer carbonate  800 mg Oral TID WC   sodium bicarbonate  1,300 mg Oral BID   Continuous Infusions:  dialysis solution 1.5% low-MG/low-CA     dialysis solution 2.5% low-MG/low-CA     dialysis solution 4.25% low-MG/low-CA       LOS: 3 days    Time spent: 45 minutes spent on chart review, discussion with nursing staff, consultants, updating family and interview/physical exam; more than 50% of that time was spent in counseling and/or coordination of care.    Geradine Girt, DO [facility] Available via Epic secure chat 7am-7pm After these hours, please refer to coverage provider  listed on [email or web address] 03/03/2022, 1:04 PM

## 2022-03-03 NOTE — Progress Notes (Signed)
[place] for Infectious Disease  Date of Admission:  02/10/2022     Abx: 6/29-c linezolid  6/26-6/29  Ceftriaxone   6/20-26 doxy/cefdinir                                           Assessment: 46 y.o. female hemangioma, esrd on home peritoneal dialysis (also has chest port and left avf), chronic hypotension on midodrine, admitted for 2 weeks of left LE cellulitis that hasn't responded to cefdinir/doxy     Clinically appear to be group a strep cellulitis/impetigo. Lacking history/characteristic of nec fasc. Other consideration is bullous impetigo from staph aureus   Seems to be improving since admission at least in terms of swelling and thrombocytosis. The impetigo changes could take longer to self resolve   ------------------ 6/30 assessment Improved with swelling/redness No new blister Blister cx ngtd  Suspect strep pyogenes process vs staph aureus impetigo.      Plan: Tomorrow if continue to be stable/improving could dc with 10 more days of linezolid 600 mg po bid She has appointment in ID clinic on 7/06 @ 330 for follow up and monitoring of linezolid toxicity Will sign off Discussed with primary team   [facility] [id number], [place],  [address] Phone: [phone]     I spent more than 35 minute reviewing data/chart, and coordinating care and >50% direct face to face time providing counseling/discussing diagnostics/treatment plan with patient       Principal Problem:   Sepsis due to cellulitis ([place]) Active Problems:   GERD (gastroesophageal reflux disease)   Hypotension   ESRD (end stage renal disease) ([place])   Cellulitis   Cellulitis of left leg   Allergies  Allergen Reactions   Oxycodone-Acetaminophen Hives and Other (See Comments)    Scheduled Meds:  Chlorhexidine Gluconate Cloth  6 each Topical Q0600   gentamicin cream  1 Application Topical Daily   heparin  5,000 Units Subcutaneous Q8H   linezolid  600 mg  Oral Q12H   midodrine  15 mg Oral TID WC   nystatin  5 mL Oral TID   pantoprazole  40 mg Oral Daily   sevelamer carbonate  800 mg Oral TID WC   sodium bicarbonate  1,300 mg Oral BID   Continuous Infusions:  dialysis solution 1.5% low-MG/low-CA     dialysis solution 2.5% low-MG/low-CA     dialysis solution 4.25% low-MG/low-CA     PRN Meds:.acetaminophen **OR** acetaminophen, ondansetron **OR** ondansetron (ZOFRAN) IV, oxyCODONE   SUBJECTIVE: Pain same No fever/chill No new blister No n/v/d  Review of Systems: ROS All other ROS was negative, except mentioned above     OBJECTIVE: Vitals:   03/02/22 1525 03/02/22 1946 03/03/22 0407 03/03/22 0809  BP: (!) 70/66 (!) 82/52 (!) 81/49 (!) 71/53  Pulse: 82 83 82 80  Resp:  '17 19 16  '$ Temp: (!) 97.4 F (36.3 C) 98.3 F (36.8 C) 98 F (36.7 C) 98 F (36.7 C)  TempSrc: Oral Oral  Oral  SpO2: 97% 100% 99% 100%  Weight:      Height:       Body mass index is 34.9 kg/m.  Physical Exam  General/constitutional: no distress, pleasant HEENT: Normocephalic Lungs: normal respiratory effort Abd: Soft, Nontender Ext: improved edema bilaterally Skin: no new blister; improved erythema; seems to handle change of  dressing well in terms of tenderness; no induration/purulence Neuro: nonfocal     Lab Results Lab Results  Component Value Date   WBC 12.6 (H) 03/03/2022   HGB 10.8 (L) 03/03/2022   HCT 31.7 (L) 03/03/2022   MCV 95.2 03/03/2022   PLT 417 (H) 03/03/2022    Lab Results  Component Value Date   CREATININE 7.10 (H) 03/03/2022   BUN 70 (H) 03/03/2022   NA 125 (L) 03/03/2022   K 3.5 03/03/2022   CL 92 (L) 03/03/2022   CO2 14 (L) 03/03/2022    Lab Results  Component Value Date   ALT 31 02/28/2022   AST 63 (H) 02/28/2022   ALKPHOS 274 (H) 02/28/2022   BILITOT 1.1 02/28/2022      Microbiology: Recent Results (from the past 240 hour(s))  Blood Culture (routine x 2)     Status: None (Preliminary result)    Collection Time: 02/26/2022  6:51 PM   Specimen: Right Antecubital; Blood  Result Value Ref Range Status   Specimen Description RIGHT ANTECUBITAL  Final   Special Requests   Final    BOTTLES DRAWN AEROBIC AND ANAEROBIC Blood Culture adequate volume   Culture   Final    NO GROWTH 4 DAYS Performed at [facility], [address], [place], [place] [address]    Report Status PENDING  Incomplete  Blood Culture (routine x 2)     Status: None (Preliminary result)   Collection Time: 02/19/2022  8:24 PM   Specimen: BLOOD RIGHT HAND  Result Value Ref Range Status   Specimen Description BLOOD RIGHT HAND  Final   Special Requests   Final    BOTTLES DRAWN AEROBIC ONLY Blood Culture adequate volume   Culture   Final    NO GROWTH 4 DAYS Performed at [facility], [address] [place], [place] [address]    Report Status PENDING  Incomplete  Body fluid culture w Gram Stain     Status: None (Preliminary result)   Collection Time: 02/28/22  6:59 PM   Specimen: Peritoneal Washings; Body Fluid  Result Value Ref Range Status   Specimen Description PERITONEAL  Final   Special Requests Normal  Final   Gram Stain   Final    RARE WBC PRESENT, PREDOMINANTLY MONONUCLEAR NO ORGANISMS SEEN    Culture   Final    NO GROWTH 3 DAYS Performed at [facility], [address], [place], [place] [address]    Report Status PENDING  Incomplete  Aerobic/Anaerobic Culture w Gram Stain (surgical/deep wound)     Status: None (Preliminary result)   Collection Time: 03/02/22 12:15 PM   Specimen: Wound  Result Value Ref Range Status   Specimen Description WOUND  Final   Special Requests LEFT LEG  Final   Gram Stain   Final    NO SQUAMOUS EPITHELIAL CELLS SEEN NO WBC SEEN NO ORGANISMS SEEN    Culture   Final    NO GROWTH < 24 HOURS Performed at [facility], [address], [place], [place] [address]    Report Status PENDING  Incomplete     Serology:   Imaging: If present, new imagings (plain  films, ct scans, and mri) have been personally visualized and interpreted; radiology reports have been reviewed. Decision making incorporated into the Impression / Recommendations.   Jabier Mutton, [place] for Infectious [place] [phone] pager    03/03/2022, 12:14 PM

## 2022-03-03 NOTE — Progress Notes (Signed)
Nephrology Follow-Up Consult note   Assessment/Recommendations: Lauren Osborn is a/an 46 y.o. female with a past medical history significant for ESRD on HD admitted with cellulitis.    # ESRD: Continue 4 exchanges w/ 2.5L fill volume.  Continue with added one 4.25% bag to help with volume removal, otherwise 2.5% bags. Total of 8hrs. No day dwell   # Volume/ hypotension: Appears total body volume overloaded w/ LEE. Some is chronic. Hypotension also chronic per patient on midodrine.  Seems to be improving.   # Anemia of Chronic Kidney Disease: Hemoglobin at goal, 10.8.  No ESA   # Secondary Hyperparathyroidism/Hyperphosphatemia: Start sevelamer 800 mg with meals.  Calcium corrects to normal  #Hyponatremia: Possibly related to fluid excess.  Unclear why its worse today.  Continue with PD as above   #Anion gap metabolic acidosis: Had improved yesterday but worse today.  Unclear why.  No diarrhea.  Start sodium bicarbonate 1300 mg twice daily   #Milky effluent: Chronic issue.  PD fluid negative for infection   #Cellulitis: Antibiotics per primary team.  Will provide fungal prophylaxis w/ nystatin. Could switch to fluconazole as she progresses   # Additional recommendations: - Dose all meds for creatinine clearance < 10 ml/min  - Unless absolutely necessary, no MRIs with gadolinium.  - Implement save arm precautions.  Prefer needle sticks in the dorsum of the hands or wrists.  No blood pressure measurements in arm. - If blood transfusion is requested during hemodialysis sessions, please alert [place] prior to the session.  - Use synthetic opioids (Fentanyl/Dilaudid) if needed   Recommendations were discussed with the primary team.   Recommendations conveyed to primary service.    [facility] 03/03/2022 11:12 AM  ___________________________________________________________  CC: cellulitis  Interval History/Subjective: Patient feels well today.  Lower  extremity is less swollen.  Still some pain in the left leg.  Tolerated PD with good ultrafiltration overnight   Medications:  Current Facility-Administered Medications  Medication Dose Route Frequency Provider Last Rate Last Admin   acetaminophen (TYLENOL) tablet 650 mg  650 mg Oral Q6H PRN Zierle-Ghosh, Asia B, DO       Or   acetaminophen (TYLENOL) suppository 650 mg  650 mg Rectal Q6H PRN Zierle-Ghosh, Asia B, DO       Chlorhexidine Gluconate Cloth 2 % PADS 6 each  6 each Topical Q0600 Nita Sells, MD   6 each at 03/03/22 0527   dialysis solution 1.5% low-MG/low-CA dianeal solution   Intraperitoneal Q24H Reesa Chew, MD       dialysis solution 2.5% low-MG/low-CA dianeal solution   Intraperitoneal Q24H Reesa Chew, MD       dialysis solution 4.25% low-MG/low-CA dianeal solution   Intraperitoneal Q24H Reesa Chew, MD       gentamicin cream (GARAMYCIN) 0.1 % 1 Application  1 Application Topical Daily Reesa Chew, MD   1 Application at 95/63/87 1000   heparin injection 5,000 Units  5,000 Units Subcutaneous Q8H Zierle-Ghosh, Asia B, DO   5,000 Units at 03/01/22 2059   linezolid (ZYVOX) tablet 600 mg  600 mg Oral Q12H Vu, Trung T, MD   600 mg at 03/03/22 0935   midodrine (PROAMATINE) tablet 15 mg  15 mg Oral TID WC Zierle-Ghosh, Asia B, DO   15 mg at 03/03/22 0935   nystatin (MYCOSTATIN) 100000 UNIT/ML suspension 500,000 Units  5 mL Oral TID Reesa Chew, MD   500,000 Units at 03/03/22 [phone]  ondansetron (ZOFRAN) tablet 4 mg  4 mg Oral Q6H PRN Zierle-Ghosh, Asia B, DO       Or   ondansetron (ZOFRAN) injection 4 mg  4 mg Intravenous Q6H PRN Zierle-Ghosh, Asia B, DO       oxyCODONE (Oxy IR/ROXICODONE) immediate release tablet 5 mg  5 mg Oral Q4H PRN Zierle-Ghosh, Asia B, DO   5 mg at 03/02/22 2034   pantoprazole (PROTONIX) EC tablet 40 mg  40 mg Oral Daily Zierle-Ghosh, Asia B, DO   40 mg at 03/03/22 0935   sodium bicarbonate tablet 1,300 mg  1,300 mg Oral BID  Reesa Chew, MD   1,300 mg at 03/03/22 8366      Review of Systems: 10 systems reviewed and negative except per interval history/subjective  Physical Exam: Vitals:   03/03/22 0407 03/03/22 0809  BP: (!) 81/49 (!) 71/53  Pulse: 82 80  Resp: 19 16  Temp: 98 F (36.7 C) 98 F (36.7 C)  SpO2: 99% 100%   No intake/output data recorded.  Intake/Output Summary (Last 24 hours) at 03/03/2022 1112 Last data filed at 03/02/2022 2032 Gross per 24 hour  Intake 120 ml  Output --  Net 120 ml   General: well-appearing, no acute distress HEENT: anicteric sclera, MMM CV: normal rate, no murmurs, 2+ pitting edema in the bilateral lower extremities but improved and some skin wrinkling Lungs: bilateral chest rise, normal wob Abd: soft, non-tender, non-distended Skin: Persistent erythema of the left lower extremity Psych: alert, engaged, appropriate mood and affect Neuro: normal speech, no gross focal deficits    Test Results I personally reviewed new and old clinical labs and radiology tests Lab Results  Component Value Date   NA 125 (L) 03/03/2022   K 3.5 03/03/2022   CL 92 (L) 03/03/2022   CO2 14 (L) 03/03/2022   BUN 70 (H) 03/03/2022   CREATININE 7.10 (H) 03/03/2022   CALCIUM 7.5 (L) 03/03/2022   ALBUMIN 1.5 (L) 03/03/2022   PHOS 8.5 (H) 03/03/2022    CBC Recent Labs  Lab 02/28/2022 1846 02/28/22 0354 03/01/22 0705 03/02/22 0733 03/03/22 0209  WBC 13.0* 14.3* 13.6* 13.2* 12.6*  NEUTROABS 10.4* 11.5*  --   --   --   HGB 11.0* 11.9* 11.9* 11.1* 10.8*  HCT 32.4* 33.6* 34.5* 32.1* 31.7*  MCV 95.6 94.1 94.0 93.0 95.2  PLT 521* 516* PLATELET CLUMPS NOTED ON SMEAR, UNABLE TO ESTIMATE 484* 417*

## 2022-03-03 NOTE — Progress Notes (Signed)
   03/03/22 0809  Assess: MEWS Score  Temp 98 F (36.7 C)  BP (!) 71/53  MAP (mmHg) (!) 61  Pulse Rate 80  Resp 16  SpO2 100 %  O2 Device Room Air  Patient Activity (if Appropriate) In bed  Assess: MEWS Score  MEWS Temp 0  MEWS Systolic 2  MEWS Pulse 0  MEWS RR 0  MEWS LOC 0  MEWS Score 2  MEWS Score Color Yellow  Assess: if the MEWS score is Yellow or Red  Were vital signs taken at a resting state? Yes  Focused Assessment No change from prior assessment  Does the patient meet 2 or more of the SIRS criteria? No  Does the patient have a confirmed or suspected source of infection? Yes  Provider and Rapid Response Notified? No  MEWS guidelines implemented *See Row Information* No, previously yellow, continue vital signs every 4 hours  Notify: Charge Nurse/RN  Name of Charge Nurse/RN Notified Kristi, RN  Date Charge Nurse/RN Notified 03/03/22  Time Charge Nurse/RN Notified 0900  Assess: SIRS CRITERIA  SIRS Temperature  0  SIRS Pulse 0  SIRS Respirations  0  SIRS WBC 0  SIRS Score Sum  0

## 2022-03-03 NOTE — TOC Benefit Eligibility Note (Signed)
Patient Teacher, English as a foreign language completed.    The patient is currently admitted and upon discharge could be taking linezolid (Zyvox) 600 mg tablets.  The current 10 day co-pay is, $0.00.   The patient is insured through [place], [place] Patient Advocate Specialist [place] Patient Advocate Team Direct Number: [phone]  Fax: [phone]

## 2022-03-03 NOTE — Care Management Important Message (Signed)
Important Message  Patient Details  Name: Lauren Osborn MRN: [id number] Date of Birth: 1975-11-30   Medicare Important Message Given:  Yes     Orbie Pyo 03/03/2022, 4:22 PM

## 2022-03-03 NOTE — Progress Notes (Signed)
Mobility Specialist Progress Note   03/03/22 1235  Mobility  Activity Ambulated independently in hallway  Level of Assistance Independent after set-up  Assistive Device None  Distance Ambulated (ft) 550 ft  Activity Response Tolerated well  $Mobility charge 1 Mobility   Pre Mobility: 80 HR During Mobility: 117 HR Post Mobility: 91 HR, 97/60 BP  Received pt in bed c/o throbbing pain(6/10) in LLE but agreeable to mobility. Pt had no faults or complaints during ambulation but upon returning to room pt was presenting w/ Vtach and stating to be feeling fine. After HR settled pt was left in bed w/ call bell in reach and all needs met. RN notified through Network engineer.   [place]land Falling Mobility Specialist Phone Number [phone]

## 2022-03-04 DIAGNOSIS — A419 Sepsis, unspecified organism: Secondary | ICD-10-CM | POA: Diagnosis not present

## 2022-03-04 DIAGNOSIS — L0390 Cellulitis, unspecified: Secondary | ICD-10-CM | POA: Diagnosis not present

## 2022-03-04 LAB — BODY FLUID CULTURE W GRAM STAIN
Culture: NO GROWTH
Special Requests: NORMAL

## 2022-03-04 LAB — CULTURE, BLOOD (ROUTINE X 2)
Culture: NO GROWTH
Culture: NO GROWTH
Special Requests: ADEQUATE
Special Requests: ADEQUATE

## 2022-03-04 LAB — RENAL FUNCTION PANEL
Albumin: 1.6 g/dL — ABNORMAL LOW (ref 3.5–5.0)
Anion gap: 18 — ABNORMAL HIGH (ref 5–15)
BUN: 75 mg/dL — ABNORMAL HIGH (ref 6–20)
CO2: 16 mmol/L — ABNORMAL LOW (ref 22–32)
Calcium: 7.8 mg/dL — ABNORMAL LOW (ref 8.9–10.3)
Chloride: 90 mmol/L — ABNORMAL LOW (ref 98–111)
Creatinine, Ser: 7.38 mg/dL — ABNORMAL HIGH (ref 0.44–1.00)
GFR, Estimated: 6 mL/min — ABNORMAL LOW (ref >60)
Glucose, Bld: 90 mg/dL (ref 70–99)
Phosphorus: 9.3 mg/dL — ABNORMAL HIGH (ref 2.5–4.6)
Potassium: 3.8 mmol/L (ref 3.5–5.1)
Sodium: 124 mmol/L — ABNORMAL LOW (ref 135–145)

## 2022-03-04 LAB — MAGNESIUM: Magnesium: 2 mg/dL (ref 1.7–2.4)

## 2022-03-04 NOTE — Progress Notes (Signed)
PROGRESS NOTE    Lauren Osborn  XIP:[id number] DOB: 05-10-1976 DOA: 02/21/2022 PCP: Carrolyn Meiers, MD    Brief Narrative:  Lauren Osborn is a 46 y.o. female with medical history significant of ESRD on peritoneal dialysis, diastolic congestive heart failure, essential hypertension, GERD, liver hemangioma, morbid obesity, thyroid nodule, and more presents to ED with a chief complaint of left leg pain.    Assessment and Plan: Sepsis due to cellulitis [facility]) - Patient had a heart rate of 92, respiratory rate of 23, leukocytosis of 13, blood pressure as low as 78/50, lactic acidosis 2.0 -Failed outpatient treatment for cellulitis with cefdinir and Doxy -Chest x-ray shows no cardiopulmonary disease -X-ray tib-fib shows extensive subcutaneous edema.  No subcutaneous gas identified.  No evidence of osteomyelitis. - ID consult zyvox: appear to be group a strep cellulitis/impetigo; plan for 10 days -Blood cultures NGTD -Continue to monitor -WOC consult appreciated- have asked nursing to let me see prior to change of bandage tomorrow  ESRD (end stage renal disease) ([place]) - ESRD since December 22 -Was initially on HD with a chest port, left AV fistula in place -Currently doing peritoneal dialysis at home-- missed HD last PM -Nephrology  Hypotension - Chronic hypertension with patient and family reporting her blood pressures never above 100 -Continue midodrine dose from home 15 mg 3 times daily  GERD (gastroesophageal reflux disease) - Continue Protonix  Hyponatremia -probably from volume overload vs poor Po intake -daily labs  Hypomagnesemia -repleted    DVT prophylaxis: heparin injection 5,000 Units Start: 02/28/22 0600 SCDs Start: 02/28/22 0245    Code Status: Full Code Family Communication:   Disposition Plan:  Level of care: Med-Surg Status is: Inpatient Remains inpatient appropriate because: home once Na better and N/V controlled    Consultants:   Nephrology ID   Subjective: C/o nausea and vomiting-- started yesterday  Objective: Vitals:   03/03/22 1412 03/03/22 2028 03/04/22 0823 03/04/22 1152  BP: (!) 81/55 (!) 88/54 (!) 74/46 (!) 80/50  Pulse: 79 86 84   Resp: '18  18 18  '$ Temp: 97.7 F (36.5 C) 97.7 F (36.5 C) 97.6 F (36.4 C) 97.6 F (36.4 C)  TempSrc: Oral Oral  Oral  SpO2: 100% 99% 98%   Weight:      Height:        Intake/Output Summary (Last 24 hours) at 03/04/2022 1158 Last data filed at 03/04/2022 0900 Gross per 24 hour  Intake 410 ml  Output --  Net 410 ml   Filed Weights   02/15/2022 1754  Weight: 89.4 kg    Examination:   General: Appearance:    Obese female in no acute distress     Lungs:      respirations unlabored  Heart:    Normal heart rate. Normal rhythm. No murmurs, rubs, or gallops.   MS:   Leg wrapped  Neurologic:   Awake, alert, oriented x 3. No apparent focal neurological           defect.        Data Reviewed: I have personally reviewed following labs and imaging studies  CBC: Recent Labs  Lab 02/28/2022 1846 02/28/22 0354 03/01/22 0705 03/02/22 0733 03/03/22 0209  WBC 13.0* 14.3* 13.6* 13.2* 12.6*  NEUTROABS 10.4* 11.5*  --   --   --   HGB 11.0* 11.9* 11.9* 11.1* 10.8*  HCT 32.4* 33.6* 34.5* 32.1* 31.7*  MCV 95.6 94.1 94.0 93.0 95.2  PLT 521* 516* PLATELET CLUMPS NOTED ON  SMEAR, UNABLE TO ESTIMATE 484* 409*   Basic Metabolic Panel: Recent Labs  Lab 02/04/2022 1846 02/28/22 0354 03/01/22 0705 03/03/22 0209 03/04/22 0136  NA 129* 133* 130* 125* 124*  K 3.5 3.6 3.7 3.5 3.8  CL 96* 99 93* 92* 90*  CO2 19* 16* 18* 14* 16*  GLUCOSE 88 79 100* 105* 90  BUN 62* 64* 70* 70* 75*  CREATININE 6.23* 6.68* 6.94* 7.10* 7.38*  CALCIUM 6.8* 7.4* 7.8* 7.5* 7.8*  MG  --  1.0* 1.8 1.6* 2.0  PHOS  --  7.5*  --  8.5* 9.3*   GFR: Estimated Creatinine Clearance: 10 mL/min (A) (by C-G formula based on SCr of 7.38 mg/dL (H)). Liver Function Tests: Recent Labs  Lab 02/28/22 0354  03/03/22 0209 03/04/22 0136  AST 63*  --   --   ALT 31  --   --   ALKPHOS 274*  --   --   BILITOT 1.1  --   --   PROT 5.7*  --   --   ALBUMIN 1.9* 1.5* 1.6*   No results for input(s): "LIPASE", "AMYLASE" in the last 168 hours. No results for input(s): "AMMONIA" in the last 168 hours. Coagulation Profile: Recent Labs  Lab 02/24/2022 1846 02/28/22 0354  INR 1.3* 1.3*   Cardiac Enzymes: No results for input(s): "CKTOTAL", "CKMB", "CKMBINDEX", "TROPONINI" in the last 168 hours. BNP (last 3 results) No results for input(s): "PROBNP" in the last 8760 hours. HbA1C: No results for input(s): "HGBA1C" in the last 72 hours. CBG: No results for input(s): "GLUCAP" in the last 168 hours. Lipid Profile: No results for input(s): "CHOL", "HDL", "LDLCALC", "TRIG", "CHOLHDL", "LDLDIRECT" in the last 72 hours. Thyroid Function Tests: No results for input(s): "TSH", "T4TOTAL", "FREET4", "T3FREE", "THYROIDAB" in the last 72 hours. Anemia Panel: No results for input(s): "VITAMINB12", "FOLATE", "FERRITIN", "TIBC", "IRON", "RETICCTPCT" in the last 72 hours. Sepsis Labs: Recent Labs  Lab 02/13/2022 1851 02/02/2022 2024 02/28/22 0354  PROCALCITON  --   --  1.70  LATICACIDVEN 1.3 2.0* 2.6*    Recent Results (from the past 240 hour(s))  Blood Culture (routine x 2)     Status: None   Collection Time: 02/25/2022  6:51 PM   Specimen: Right Antecubital; Blood  Result Value Ref Range Status   Specimen Description RIGHT ANTECUBITAL  Final   Special Requests   Final    BOTTLES DRAWN AEROBIC AND ANAEROBIC Blood Culture adequate volume   Culture   Final    NO GROWTH 5 DAYS Performed at [facility], [address] [place], [place] [address]    Report Status 03/04/2022 FINAL  Final  Blood Culture (routine x 2)     Status: None   Collection Time: 03/03/2022  8:24 PM   Specimen: BLOOD RIGHT HAND  Result Value Ref Range Status   Specimen Description BLOOD RIGHT HAND  Final   Special Requests   Final     BOTTLES DRAWN AEROBIC ONLY Blood Culture adequate volume   Culture   Final    NO GROWTH 5 DAYS Performed at [facility], [address] [place], [place] [address]    Report Status 03/04/2022 FINAL  Final  Body fluid culture w Gram Stain     Status: None   Collection Time: 02/28/22  6:59 PM   Specimen: Peritoneal Washings; Body Fluid  Result Value Ref Range Status   Specimen Description PERITONEAL  Final   Special Requests Normal  Final   Gram Stain  Final    RARE WBC PRESENT, PREDOMINANTLY MONONUCLEAR NO ORGANISMS SEEN    Culture   Final    NO GROWTH 3 DAYS Performed at [facility] [address], [place], [place] [address]    Report Status 03/04/2022 FINAL  Final  Aerobic/Anaerobic Culture w Gram Stain (surgical/deep wound)     Status: None (Preliminary result)   Collection Time: 03/02/22 12:15 PM   Specimen: Wound  Result Value Ref Range Status   Specimen Description WOUND  Final   Special Requests LEFT LEG  Final   Gram Stain   Final    NO SQUAMOUS EPITHELIAL CELLS SEEN NO WBC SEEN NO ORGANISMS SEEN    Culture   Final    NO GROWTH < 24 HOURS Performed at [facility], [place] [address] [place],  [address]    Report Status PENDING  Incomplete         Radiology Studies: No results found.      Scheduled Meds:  Chlorhexidine Gluconate Cloth  6 each Topical Q0600   gentamicin cream  1 Application Topical Daily   heparin  5,000 Units Subcutaneous Q8H   linezolid  600 mg Oral Q12H   midodrine  15 mg Oral TID WC   nystatin  5 mL Oral TID   pantoprazole  40 mg Oral Daily   sevelamer carbonate  800 mg Oral TID WC   sodium bicarbonate  1,300 mg Oral BID   Continuous Infusions:  dialysis solution 1.5% low-MG/low-CA Stopped (03/03/22 1410)   dialysis solution 2.5% low-MG/low-CA Stopped (03/03/22 1410)   dialysis solution 4.25% low-MG/low-CA Stopped (03/03/22 1410)     LOS: 4 days    Time spent: 45 minutes spent on chart review,  discussion with nursing staff, consultants, updating family and interview/physical exam; more than 50% of that time was spent in counseling and/or coordination of care.    Geradine Girt, DO [facility] Available via Epic secure chat 7am-7pm After these hours, please refer to coverage provider listed on [email or web address] 03/04/2022, 11:58 AM

## 2022-03-04 NOTE — Progress Notes (Signed)
Service  recovery provided for patient. Patient acknowledges understanding via verbal demonstration.

## 2022-03-04 NOTE — Progress Notes (Signed)
Nephrology Follow-Up Consult note   Assessment/Recommendations: Lauren Osborn is a/an 46 y.o. female with a past medical history significant for ESRD on HD admitted with cellulitis.    # ESRD: Continue 4 exchanges w/ 2.5L fill volume.  Continue with added one 4.25% bag to help with volume removal, otherwise 2.5% bags. Total of 8hrs. No day dwell.  Missed dialysis on 6/30 for unclear reason.  Discussing with nursing.  Her clearance has not been great.  Could consider additional exchange overnight   # Volume/ hypotension: Appears total body volume overloaded w/ LEE. Some is chronic. Hypotension also chronic per patient on midodrine.  Seems to be improving but unfortunately backtracked because missed dialysis on 6/30   # Anemia of Chronic Kidney Disease: Hemoglobin at goal.  No ESA   # Secondary Hyperparathyroidism/Hyperphosphatemia: Continue sevelamer 800 mg with meals.  Higher today because she missed dialysis.  Calcium corrects to normal  #Hyponatremia: Possibly related to fluid excess.  Persistent today likely because she missed dialysis last night   #Anion gap metabolic acidosis: Missed dialysis on 6/30.  Continue sodium bicarbonate 1300 mg twice daily   #Milky effluent: Chronic issue.  PD fluid negative for infection   #Cellulitis: Antibiotics per primary team.  Will provide fungal prophylaxis w/ nystatin. Could switch to fluconazole as she progresses   # Additional recommendations: - Dose all meds for creatinine clearance < 10 ml/min  - Unless absolutely necessary, no MRIs with gadolinium.  - Implement save arm precautions.  Prefer needle sticks in the dorsum of the hands or wrists.  No blood pressure measurements in arm. - If blood transfusion is requested during hemodialysis sessions, please alert [place] prior to the session.  - Use synthetic opioids (Fentanyl/Dilaudid) if needed   Recommendations were discussed with the primary team.   Recommendations conveyed to primary service.     [facility] 03/04/2022 9:56 AM  ___________________________________________________________  CC: cellulitis  Interval History/Subjective: Patient did not get dialysis last night and unclear cause.  Will discuss with nursing.  Dialysis was ordered.  Patient frustrated about missing dialysis.  Having some nausea this morning as well as vomiting.  Pain in her left lower extremity.  Otherwise unchanged   Medications:  Current Facility-Administered Medications  Medication Dose Route Frequency Provider Last Rate Last Admin   acetaminophen (TYLENOL) tablet 650 mg  650 mg Oral Q6H PRN Zierle-Ghosh, Asia B, DO       Or   acetaminophen (TYLENOL) suppository 650 mg  650 mg Rectal Q6H PRN Zierle-Ghosh, Asia B, DO       Chlorhexidine Gluconate Cloth 2 % PADS 6 each  6 each Topical Q0600 Nita Sells, MD   6 each at 03/04/22 0538   dialysis solution 1.5% low-MG/low-CA dianeal solution   Intraperitoneal Q24H Reesa Chew, MD   Held at 03/03/22 1410   dialysis solution 2.5% low-MG/low-CA dianeal solution   Intraperitoneal Q24H Reesa Chew, MD   Held at 03/03/22 1410   dialysis solution 4.25% low-MG/low-CA dianeal solution   Intraperitoneal Q24H Reesa Chew, MD   Held at 03/03/22 1410   gentamicin cream (GARAMYCIN) 0.1 % 1 Application  1 Application Topical Daily Reesa Chew, MD   1 Application at 27/03/50 1000   heparin injection 5,000 Units  5,000 Units Subcutaneous Q8H Zierle-Ghosh, Asia B, DO   5,000 Units at 03/04/22 0537   linezolid (ZYVOX) tablet 600 mg  600 mg Oral Q12H Vu, Rockey Situ, MD   600  mg at 03/04/22 0919   midodrine (PROAMATINE) tablet 15 mg  15 mg Oral TID WC Zierle-Ghosh, Asia B, DO   15 mg at 03/04/22 0920   nystatin (MYCOSTATIN) 100000 UNIT/ML suspension 500,000 Units  5 mL Oral TID Reesa Chew, MD   500,000 Units at 03/04/22 0921   ondansetron (ZOFRAN) tablet 4 mg  4 mg Oral Q6H PRN Zierle-Ghosh, Asia B, DO        Or   ondansetron (ZOFRAN) injection 4 mg  4 mg Intravenous Q6H PRN Zierle-Ghosh, Asia B, DO   4 mg at 03/04/22 6812   oxyCODONE (Oxy IR/ROXICODONE) immediate release tablet 5 mg  5 mg Oral Q4H PRN Zierle-Ghosh, Asia B, DO   5 mg at 03/04/22 0918   pantoprazole (PROTONIX) EC tablet 40 mg  40 mg Oral Daily Zierle-Ghosh, Asia B, DO   40 mg at 03/04/22 0919   sevelamer carbonate (RENVELA) tablet 800 mg  800 mg Oral TID WC Reesa Chew, MD   800 mg at 03/04/22 0920   sodium bicarbonate tablet 1,300 mg  1,300 mg Oral BID Reesa Chew, MD   1,300 mg at 03/04/22 0920      Review of Systems: 10 systems reviewed and negative except per interval history/subjective  Physical Exam: Vitals:   03/03/22 2028 03/04/22 0823  BP: (!) 88/54 (!) 74/46  Pulse: 86 84  Resp:  18  Temp: 97.7 F (36.5 C) 97.6 F (36.4 C)  SpO2: 99% 98%   No intake/output data recorded.  Intake/Output Summary (Last 24 hours) at 03/04/2022 0956 Last data filed at 03/03/2022 2028 Gross per 24 hour  Intake 170 ml  Output --  Net 170 ml   General: well-appearing, no acute distress HEENT: anicteric sclera, MMM CV: normal rate, no murmurs, 2+ pitting edema in the bilateral lower extremities but improved and some skin wrinkling Lungs: bilateral chest rise, normal wob Abd: soft, non-tender, non-distended Skin: Persistent erythema of the left lower extremity Psych: alert, engaged, appropriate mood and affect Neuro: normal speech, no gross focal deficits    Test Results I personally reviewed new and old clinical labs and radiology tests Lab Results  Component Value Date   NA 124 (L) 03/04/2022   K 3.8 03/04/2022   CL 90 (L) 03/04/2022   CO2 16 (L) 03/04/2022   BUN 75 (H) 03/04/2022   CREATININE 7.38 (H) 03/04/2022   CALCIUM 7.8 (L) 03/04/2022   ALBUMIN 1.6 (L) 03/04/2022   PHOS 9.3 (H) 03/04/2022    CBC Recent Labs  Lab 02/19/2022 1846 02/28/22 0354 03/01/22 0705 03/02/22 0733 03/03/22 0209  WBC  13.0* 14.3* 13.6* 13.2* 12.6*  NEUTROABS 10.4* 11.5*  --   --   --   HGB 11.0* 11.9* 11.9* 11.1* 10.8*  HCT 32.4* 33.6* 34.5* 32.1* 31.7*  MCV 95.6 94.1 94.0 93.0 95.2  PLT 521* 516* PLATELET CLUMPS NOTED ON SMEAR, UNABLE TO ESTIMATE 484* 417*

## 2022-03-04 DEATH — deceased

## 2022-03-05 ENCOUNTER — Inpatient Hospital Stay (HOSPITAL_COMMUNITY): Payer: Medicare HMO

## 2022-03-05 DIAGNOSIS — A419 Sepsis, unspecified organism: Secondary | ICD-10-CM | POA: Diagnosis not present

## 2022-03-05 DIAGNOSIS — L0390 Cellulitis, unspecified: Secondary | ICD-10-CM | POA: Diagnosis not present

## 2022-03-05 LAB — MAGNESIUM: Magnesium: 2 mg/dL (ref 1.7–2.4)

## 2022-03-05 LAB — RENAL FUNCTION PANEL
Albumin: 1.6 g/dL — ABNORMAL LOW (ref 3.5–5.0)
Anion gap: 20 — ABNORMAL HIGH (ref 5–15)
BUN: 75 mg/dL — ABNORMAL HIGH (ref 6–20)
CO2: 15 mmol/L — ABNORMAL LOW (ref 22–32)
Calcium: 8.1 mg/dL — ABNORMAL LOW (ref 8.9–10.3)
Chloride: 91 mmol/L — ABNORMAL LOW (ref 98–111)
Creatinine, Ser: 7.71 mg/dL — ABNORMAL HIGH (ref 0.44–1.00)
GFR, Estimated: 6 mL/min — ABNORMAL LOW (ref >60)
Glucose, Bld: 104 mg/dL — ABNORMAL HIGH (ref 70–99)
Phosphorus: 9.9 mg/dL — ABNORMAL HIGH (ref 2.5–4.6)
Potassium: 3.3 mmol/L — ABNORMAL LOW (ref 3.5–5.1)
Sodium: 126 mmol/L — ABNORMAL LOW (ref 135–145)

## 2022-03-05 LAB — CBC
HCT: 32 % — ABNORMAL LOW (ref 36.0–46.0)
Hemoglobin: 11.2 g/dL — ABNORMAL LOW (ref 12.0–15.0)
MCH: 32.1 pg (ref 26.0–34.0)
MCHC: 35 g/dL (ref 30.0–36.0)
MCV: 91.7 fL (ref 80.0–100.0)
Platelets: 441 10*3/uL — ABNORMAL HIGH (ref 150–400)
RBC: 3.49 MIL/uL — ABNORMAL LOW (ref 3.87–5.11)
RDW: 15.4 % (ref 11.5–15.5)
WBC: 15.4 10*3/uL — ABNORMAL HIGH (ref 4.0–10.5)
nRBC: 0 % (ref 0.0–0.2)

## 2022-03-05 MED ORDER — SEVELAMER CARBONATE 800 MG PO TABS
1600.0000 mg | ORAL_TABLET | Freq: Three times a day (TID) | ORAL | Status: DC
  Administered 2022-03-05 – 2022-03-06 (×3): 1600 mg via ORAL
  Filled 2022-03-05 (×3): qty 2

## 2022-03-05 MED ORDER — LIDOCAINE 5 % EX PTCH
2.0000 | MEDICATED_PATCH | CUTANEOUS | Status: DC
  Administered 2022-03-05 – 2022-03-10 (×6): 2 via TRANSDERMAL
  Filled 2022-03-05 (×7): qty 2

## 2022-03-05 MED ORDER — GABAPENTIN 100 MG PO CAPS
100.0000 mg | ORAL_CAPSULE | Freq: Every day | ORAL | Status: DC
  Administered 2022-03-05 – 2022-03-11 (×7): 100 mg via ORAL
  Filled 2022-03-05 (×7): qty 1

## 2022-03-05 NOTE — Plan of Care (Signed)
  Problem: Clinical Measurements: Goal: Respiratory complications will improve Outcome: Progressing   Problem: Nutrition: Goal: Adequate nutrition will be maintained Outcome: Progressing   Problem: Coping: Goal: Level of anxiety will decrease Outcome: Progressing   

## 2022-03-05 NOTE — Progress Notes (Signed)
Nephrology Follow-Up Consult note   Assessment/Recommendations: Lauren Osborn is a/an 46 y.o. female with a past medical history significant for ESRD on HD admitted with cellulitis.    # ESRD: Home prescription with 8 hours, 4 exchanges w/ 2.5L fill volume all 2.5% bags.  Continue with added one 4.25% bag to help with volume removal, otherwise 2.5% bags.  No day dwell.  Missed dialysis on 6/30 for unclear reason and discussed with nursing. -Her clearance is fairly poor with current prescription based on BUN -We will add another exchange and extend treatment to 9 hours   # Volume/ hypotension: Appears total body volume overloaded w/ LEE. Some is chronic. Hypotension also chronic per patient on midodrine.     # Anemia of Chronic Kidney Disease: Hemoglobin at goal.  No ESA   # Secondary Hyperparathyroidism/Hyperphosphatemia: Increase sevelamer to 1600 mg with meals.   Calcium corrects to normal  #Hyponatremia: Possibly related to fluid excess.  Consider all 4.25% bags   #Anion gap metabolic acidosis: Missed dialysis on 6/30.  Continue sodium bicarbonate 1300 mg twice daily   #Milky effluent: Chronic issue.  PD fluid negative for infection   #Cellulitis: Antibiotics per primary team.  Will provide fungal prophylaxis w/ nystatin. Could switch to fluconazole as she progresses   # Additional recommendations: - Dose all meds for creatinine clearance < 10 ml/min  - Unless absolutely necessary, no MRIs with gadolinium.  - Implement save arm precautions.  Prefer needle sticks in the dorsum of the hands or wrists.  No blood pressure measurements in arm. - If blood transfusion is requested during hemodialysis sessions, please alert [place] prior to the session.  - Use synthetic opioids (Fentanyl/Dilaudid) if needed   Recommendations were discussed with the primary team.   Recommendations conveyed to primary service.    [facility] 03/05/2022 8:49  AM  ___________________________________________________________  CC: cellulitis  Interval History/Subjective: Patient tripped and fell yesterday.  Did not sustain any significant injuries.  Did receive PD last night and tolerated fairly well.  Still having some pain in her legs.  Persistent edema    Medications:  Current Facility-Administered Medications  Medication Dose Route Frequency Provider Last Rate Last Admin   acetaminophen (TYLENOL) tablet 650 mg  650 mg Oral Q6H PRN Zierle-Ghosh, Asia B, DO   650 mg at 03/04/22 2207   Or   acetaminophen (TYLENOL) suppository 650 mg  650 mg Rectal Q6H PRN Zierle-Ghosh, Asia B, DO       Chlorhexidine Gluconate Cloth 2 % PADS 6 each  6 each Topical Q0600 Nita Sells, MD   6 each at 03/05/22 0537   dialysis solution 1.5% low-MG/low-CA dianeal solution   Intraperitoneal Q24H Reesa Chew, MD   Held at 03/03/22 1410   dialysis solution 2.5% low-MG/low-CA dianeal solution   Intraperitoneal Q24H Reesa Chew, MD   Held at 03/03/22 1410   dialysis solution 4.25% low-MG/low-CA dianeal solution   Intraperitoneal Q24H Reesa Chew, MD   Held at 03/03/22 1410   gentamicin cream (GARAMYCIN) 0.1 % 1 Application  1 Application Topical Daily Reesa Chew, MD   1 Application at 22/97/98 1000   heparin injection 5,000 Units  5,000 Units Subcutaneous Q8H Zierle-Ghosh, Asia B, DO   5,000 Units at 03/04/22 0537   linezolid (ZYVOX) tablet 600 mg  600 mg Oral Q12H Vu, Trung T, MD   600 mg at 03/04/22 2207   midodrine (PROAMATINE) tablet 15 mg  15 mg Oral  TID WC Zierle-Ghosh, Asia B, DO   15 mg at 03/04/22 1810   nystatin (MYCOSTATIN) 100000 UNIT/ML suspension 500,000 Units  5 mL Oral TID Reesa Chew, MD   500,000 Units at 03/04/22 2207   ondansetron (ZOFRAN) tablet 4 mg  4 mg Oral Q6H PRN Zierle-Ghosh, Asia B, DO       Or   ondansetron (ZOFRAN) injection 4 mg  4 mg Intravenous Q6H PRN Zierle-Ghosh, Asia B, DO   4 mg at 03/04/22 4503    oxyCODONE (Oxy IR/ROXICODONE) immediate release tablet 5 mg  5 mg Oral Q4H PRN Zierle-Ghosh, Asia B, DO   5 mg at 03/04/22 2207   pantoprazole (PROTONIX) EC tablet 40 mg  40 mg Oral Daily Zierle-Ghosh, Asia B, DO   40 mg at 03/04/22 0919   sevelamer carbonate (RENVELA) tablet 1,600 mg  1,600 mg Oral TID WC Reesa Chew, MD       sodium bicarbonate tablet 1,300 mg  1,300 mg Oral BID Reesa Chew, MD   1,300 mg at 03/04/22 2207      Review of Systems: 10 systems reviewed and negative except per interval history/subjective  Physical Exam: Vitals:   03/04/22 2140 03/04/22 2312  BP: (!) 64/51 (!) 89/58  Pulse: 72 68  Resp:    Temp: 97.6 F (36.4 C)   SpO2: 99%    No intake/output data recorded.  Intake/Output Summary (Last 24 hours) at 03/05/2022 0849 Last data filed at 03/04/2022 0900 Gross per 24 hour  Intake 240 ml  Output --  Net 240 ml   General: well-appearing, no acute distress HEENT: anicteric sclera, MMM CV: normal rate, no murmurs, 2+ pitting edema in the bilateral lower extremities but improved and some skin wrinkling Lungs: bilateral chest rise, normal wob Abd: soft, non-tender, non-distended Skin: Persistent erythema of the left lower extremity Psych: alert, engaged, appropriate mood and affect Neuro: normal speech, no gross focal deficits    Test Results I personally reviewed new and old clinical labs and radiology tests Lab Results  Component Value Date   NA 126 (L) 03/05/2022   K 3.3 (L) 03/05/2022   CL 91 (L) 03/05/2022   CO2 15 (L) 03/05/2022   BUN 75 (H) 03/05/2022   CREATININE 7.71 (H) 03/05/2022   CALCIUM 8.1 (L) 03/05/2022   ALBUMIN 1.6 (L) 03/05/2022   PHOS 9.9 (H) 03/05/2022    CBC Recent Labs  Lab 02/21/2022 1846 02/28/22 0354 03/01/22 0705 03/02/22 0733 03/03/22 0209 03/05/22 0106  WBC 13.0* 14.3*   < > 13.2* 12.6* 15.4*  NEUTROABS 10.4* 11.5*  --   --   --   --   HGB 11.0* 11.9*   < > 11.1* 10.8* 11.2*  HCT 32.4* 33.6*   < >  32.1* 31.7* 32.0*  MCV 95.6 94.1   < > 93.0 95.2 91.7  PLT 521* 516*   < > 484* 417* 441*   < > = values in this interval not displayed.

## 2022-03-05 NOTE — Progress Notes (Signed)
Received pt on bed watching TV awake, alert and oriented x4, Asked pt for any questions or concerns to which she verbalized none.PD dressing intact, dont today with sites no signs of infection.  Started PD treatment at 2109. Before treatment Drained only 553m, pt verbalized that's enough. Review with patient the Do's and Dont's before treatment. Bedside RN AIvar Drapealso understood on what to do or number to call when alarms on the machine.

## 2022-03-05 NOTE — Progress Notes (Signed)
   03/04/22 2312  Assess: MEWS Score  BP (!) 89/58  Pulse Rate 68  Assess: MEWS Score  MEWS Temp 0  MEWS Systolic 1  MEWS Pulse 0  MEWS RR 0  MEWS LOC 0  MEWS Score 1  MEWS Score Color Green  Assess: if the MEWS score is Yellow or Red  Were vital signs taken at a resting state? Yes  Focused Assessment No change from prior assessment  Does the patient meet 2 or more of the SIRS criteria? No  Treat  MEWS Interventions Administered scheduled meds/treatments  Pain Scale 0-10  Pain Score 2  Take Vital Signs  Increase Vital Sign Frequency  Yellow: Q 2hr X 2 then Q 4hr X 2, if remains yellow, continue Q 4hrs  Assess: SIRS CRITERIA  SIRS Temperature  0  SIRS Pulse 0  SIRS Respirations  0  SIRS WBC 0  SIRS Score Sum  0

## 2022-03-05 NOTE — Evaluation (Signed)
Physical Therapy Evaluation Patient Details Name: Lauren Osborn MRN: [id number] DOB: 1976-01-13 Today's Date: 03/05/2022  History of Present Illness  Pt is a 46yo F admitted on 6/26 for LLE cellulitis resulting in sepsis. Pt with inpatient mechanical fall on 7/1. PMH: CHF, HTN, GERD, chronic hypotension, peritoneal dialysis  Clinical Impression  Prior to admission, pt was independent with all ADLs/IADLs and driving. Pt was ambulating 560f independently while inpatient prior to fall. Pt states she was attempting to fix her sock while stag and lost her balance. Pt currently requiring minA for bed mobility and able to ambulate 672fwith RW and min guard. Pt complaining of low back and R hip pain since fall. Back and hip not tender to palpation but increased pain with ambulation and trunk/hip flexion. Pt requesting to return back to bed after 6052fmbulation and given heat pack for R hip. Pt would benefit from acute PT to address deficits in functional mobility and increase independence level to ensure safe discharge home. Recommending HHPT upon discharge to maximize functional independence.      Recommendations for follow up therapy are one component of a multi-disciplinary discharge planning process, led by the attending physician.  Recommendations may be updated based on patient status, additional functional criteria and insurance authorization.  Follow Up Recommendations Home health PT      Assistance Recommended at Discharge Intermittent Supervision/Assistance  Patient can return home with the following  A little help with walking and/or transfers;A little help with bathing/dressing/bathroom;Assistance with cooking/housework;Assist for transportation;Help with stairs or ramp for entrance    Equipment Recommendations None recommended by PT  Recommendations for Other Services       Functional Status Assessment Patient has had a recent decline in their functional status and demonstrates the  ability to make significant improvements in function in a reasonable and predictable amount of time.     Precautions / Restrictions Precautions Precautions: Fall Restrictions Weight Bearing Restrictions: No      Mobility  Bed Mobility Overal bed mobility: Needs Assistance Bed Mobility: Sit to Supine       Sit to supine: Min assist   General bed mobility comments: Pt EOB upon arrival. MinA for LLE to return to supine    Transfers Overall transfer level: Needs assistance Equipment used: None Transfers: Sit to/from Stand Sit to Stand: Min guard           General transfer comment: increased time to achieve upright position, min guard for safety    Ambulation/Gait Ambulation/Gait assistance: Min guard Gait Distance (Feet): 60 Feet Assistive device: Rolling walker (2 wheels) Gait Pattern/deviations: Step-to pattern, Decreased stride length, Decreased stance time - right, Decreased step length - left, Trunk flexed, Antalgic Gait velocity: decreased Gait velocity interpretation: <1.8 ft/sec, indicate of risk for recurrent falls   General Gait Details: Pt complaining of R hip pain throughout and mild dizziness. Antlagic gait with attempts to offweight R hip through L lateral lean.  Stairs            Wheelchair Mobility    Modified Rankin (Stroke Patients Only)       Balance Overall balance assessment: Needs assistance Sitting-balance support: Feet supported, No upper extremity supported Sitting balance-Leahy Scale: Good     Standing balance support: No upper extremity supported Standing balance-Leahy Scale: Fair Standing balance comment: Able to stand EOB with no UE support but unable to maintain for ambulation  Pertinent Vitals/Pain Pain Assessment Pain Assessment: 0-10 Pain Score: 6  Pain Location: Low back and R hip Pain Descriptors / Indicators: Discomfort, Guarding, Sharp, Shooting Pain Intervention(s):  Limited activity within patient's tolerance, Monitored during session, Heat applied    Home Living Family/patient expects to be discharged to:: Private residence Living Arrangements: Children (daughter and son in Sports [employer]) Available Help at Discharge: Family;Available PRN/intermittently Type of Home: Apartment Home Access: Stairs to enter   Entrance Stairs-Number of Steps: 1   Home Layout: One level   Additional Comments: Daughter works, Son in Sports coach is in school. Son in law helps her with bags for peritoneal dialysis.    Prior Function Prior Level of Function : Independent/Modified Independent;Driving                     Hand Dominance        Extremity/Trunk Assessment   Upper Extremity Assessment Upper Extremity Assessment: Overall WFL for tasks assessed    Lower Extremity Assessment Lower Extremity Assessment: Overall WFL for tasks assessed       Communication   Communication: No difficulties  Cognition Arousal/Alertness: Awake/alert Behavior During Therapy: WFL for tasks assessed/performed Overall Cognitive Status: Within Functional Limits for tasks assessed                                          General Comments      Exercises     Assessment/Plan    PT Assessment Patient needs continued PT services  PT Problem List Decreased activity tolerance;Decreased balance;Decreased mobility;Pain       PT Treatment Interventions DME instruction;Gait training;Stair training;Functional mobility training;Therapeutic activities;Therapeutic exercise;Balance training;Patient/family education;Manual techniques;Modalities    PT Goals (Current goals can be found in the Care Plan section)  Acute Rehab PT Goals Patient Stated Goal: to decrease pain PT Goal Formulation: With patient Time For Goal Achievement: 03/19/22 Potential to Achieve Goals: Good    Frequency Min 3X/week     Co-evaluation               AM-PAC PT "6 Clicks" Mobility   Outcome Measure Help needed turning from your back to your side while in a flat bed without using bedrails?: None Help needed moving from lying on your back to sitting on the side of a flat bed without using bedrails?: A Little Help needed moving to and from a bed to a chair (including a wheelchair)?: A Little Help needed standing up from a chair using your arms (e.g., wheelchair or bedside chair)?: A Little Help needed to walk in hospital room?: A Little Help needed climbing 3-5 steps with a railing? : A Lot 6 Click Score: 18    End of Session Equipment Utilized During Treatment: Gait belt Activity Tolerance: Patient limited by pain Patient left: in bed;with call bell/phone within reach;with nursing/sitter in room Nurse Communication: Mobility status PT Visit Diagnosis: Unsteadiness on feet (R26.81);Other abnormalities of gait and mobility (R26.89);Pain Pain - Right/Left: Right Pain - part of body: Hip    Time: 1660-6301 PT Time Calculation (min) (ACUTE ONLY): 23 min   Charges:   PT Evaluation $PT Eval Moderate Complexity: 1 Mod PT Treatments $Therapeutic Activity: 8-22 mins      Mackie Pai, SPT Acute Rehabilitation Services  Office: [phone]    Mackie Pai 03/05/2022, 9:20 AM

## 2022-03-05 NOTE — Progress Notes (Signed)
PD Tx completed. Pt tolerated tx well. Pd dressing changed per policy. D/c tx using aseptic technique. No s/s of complications. No pt acute distress noted. Total UF: 2777m Initial drain volume: 13963mTotal therapy time: 11hr 3721m

## 2022-03-05 NOTE — Progress Notes (Signed)
   03/04/22 2040  Assess: MEWS Score  Level of Consciousness Alert  Assess: MEWS Score  MEWS Temp 0  MEWS Systolic 2  MEWS Pulse 0  MEWS RR 0  MEWS LOC 0  MEWS Score 2  MEWS Score Color Yellow  Assess: if the MEWS score is Yellow or Red  Were vital signs taken at a resting state? Yes  Focused Assessment No change from prior assessment  Does the patient meet 2 or more of the SIRS criteria? No  Does the patient have a confirmed or suspected source of infection? Yes  Treat  Pain Scale 0-10  Pain Score 7  Pain Location Back  Pain Orientation Lower  Pain Intervention(s) Medication (See eMAR)  Take Vital Signs  Increase Vital Sign Frequency  Yellow: Q 2hr X 2 then Q 4hr X 2, if remains yellow, continue Q 4hrs  Document  Patient Outcome Stabilized after interventions;Other (Comment) (pt baseline bp is low)  Progress note created (see row info) Yes

## 2022-03-05 NOTE — Progress Notes (Signed)
PROGRESS NOTE    Lauren Osborn  FXT:[id number] DOB: [birth year] DOA: 02/07/2022 PCP: Carrolyn Meiers, MD    Brief Narrative:  Lauren Osborn is a 46 y.o. female with medical history significant of ESRD on peritoneal dialysis, diastolic congestive heart failure, essential hypertension, GERD, liver hemangioma, morbid obesity, thyroid nodule, and more presents to ED with a chief complaint of left leg pain.   Found to have a cellulitis.  Slow to improve.  Had a fall on 7/1.     Assessment and Plan: Sepsis due to cellulitis [facility]) - Patient had a heart rate of 92, respiratory rate of 23, leukocytosis of 13, blood pressure as low as 78/50, lactic acidosis 2.0 -Failed outpatient treatment for cellulitis with cefdinir and Doxy -Chest x-ray shows no cardiopulmonary disease -X-ray tib-fib shows extensive subcutaneous edema.  No subcutaneous gas identified.  No evidence of osteomyelitis. - ID consult zyvox: appear to be group a strep cellulitis/impetigo; plan for 10 days -Blood cultures NGTD -Continue to monitor -WOC consult appreciated  ESRD (end stage renal disease) ([place]) - ESRD since December 22 -Was initially on HD with a chest port, left AV fistula in place -Currently doing peritoneal dialysis at home-- missed PD Friday night -Nephrology  Hypotension - Chronic hypertension with patient and family reporting her blood pressures never above 100 -Continue midodrine dose from home 15 mg 3 times daily  GERD (gastroesophageal reflux disease) - Continue Protonix  Hyponatremia -probably from volume overload vs poor Po intake -daily labs  Hypomagnesemia -repleted  Fall -xray negative for fracture  DVT prophylaxis: heparin injection 5,000 Units Start: 02/28/22 0600 SCDs Start: 02/28/22 0245    Code Status: Full Code Family Communication:   Disposition Plan:  Level of care: Med-Surg Status is: Inpatient Remains inpatient appropriate because: home once Na better and N/V  controlled    Consultants:  Nephrology ID   Subjective: Had a fall yesterday  Objective: Vitals:   03/04/22 2140 03/04/22 2312 03/05/22 0853 03/05/22 0923  BP: (!) 64/51 (!) 89/58 (!) 64/41 (!) 70/40  Pulse: 72 68 71   Resp:   16   Temp: 97.6 F (36.4 C)  (!) 97.5 F (36.4 C)   TempSrc:   Oral Oral  SpO2: 99%  100%   Weight:      Height:       No intake or output data in the 24 hours ending 03/05/22 1252  Filed Weights   02/05/2022 1754  Weight: 89.4 kg    Examination:   General: Appearance:    Obese female in no acute distress     Lungs:      respirations unlabored  Heart:    Normal heart rate. Normal rhythm. No murmurs, rubs, or gallops.   MS:   Leg wrapped  Neurologic:   Awake, alert, oriented x 3. No apparent focal neurological           defect.        Data Reviewed: I have personally reviewed following labs and imaging studies  CBC: Recent Labs  Lab 02/28/2022 1846 02/28/22 0354 03/01/22 0705 03/02/22 0733 03/03/22 0209 03/05/22 0106  WBC 13.0* 14.3* 13.6* 13.2* 12.6* 15.4*  NEUTROABS 10.4* 11.5*  --   --   --   --   HGB 11.0* 11.9* 11.9* 11.1* 10.8* 11.2*  HCT 32.4* 33.6* 34.5* 32.1* 31.7* 32.0*  MCV 95.6 94.1 94.0 93.0 95.2 91.7  PLT 521* 516* PLATELET CLUMPS NOTED ON SMEAR, UNABLE TO ESTIMATE 484* 417* 441*  Basic Metabolic Panel: Recent Labs  Lab 02/28/22 0354 03/01/22 0705 03/03/22 0209 03/04/22 0136 03/05/22 0106  NA 133* 130* 125* 124* 126*  K 3.6 3.7 3.5 3.8 3.3*  CL 99 93* 92* 90* 91*  CO2 16* 18* 14* 16* 15*  GLUCOSE 79 100* 105* 90 104*  BUN 64* 70* 70* 75* 75*  CREATININE 6.68* 6.94* 7.10* 7.38* 7.71*  CALCIUM 7.4* 7.8* 7.5* 7.8* 8.1*  MG 1.0* 1.8 1.6* 2.0 2.0  PHOS 7.5*  --  8.5* 9.3* 9.9*   GFR: Estimated Creatinine Clearance: 9.6 mL/min (A) (by C-G formula based on SCr of 7.71 mg/dL (H)). Liver Function Tests: Recent Labs  Lab 02/28/22 0354 03/03/22 0209 03/04/22 0136 03/05/22 0106  AST 63*  --   --   --    ALT 31  --   --   --   ALKPHOS 274*  --   --   --   BILITOT 1.1  --   --   --   PROT 5.7*  --   --   --   ALBUMIN 1.9* 1.5* 1.6* 1.6*   No results for input(s): "LIPASE", "AMYLASE" in the last 168 hours. No results for input(s): "AMMONIA" in the last 168 hours. Coagulation Profile: Recent Labs  Lab 02/11/2022 1846 02/28/22 0354  INR 1.3* 1.3*   Cardiac Enzymes: No results for input(s): "CKTOTAL", "CKMB", "CKMBINDEX", "TROPONINI" in the last 168 hours. BNP (last 3 results) No results for input(s): "PROBNP" in the last 8760 hours. HbA1C: No results for input(s): "HGBA1C" in the last 72 hours. CBG: No results for input(s): "GLUCAP" in the last 168 hours. Lipid Profile: No results for input(s): "CHOL", "HDL", "LDLCALC", "TRIG", "CHOLHDL", "LDLDIRECT" in the last 72 hours. Thyroid Function Tests: No results for input(s): "TSH", "T4TOTAL", "FREET4", "T3FREE", "THYROIDAB" in the last 72 hours. Anemia Panel: No results for input(s): "VITAMINB12", "FOLATE", "FERRITIN", "TIBC", "IRON", "RETICCTPCT" in the last 72 hours. Sepsis Labs: Recent Labs  Lab 02/23/2022 1851 02/28/2022 2024 02/28/22 0354  PROCALCITON  --   --  1.70  LATICACIDVEN 1.3 2.0* 2.6*    Recent Results (from the past 240 hour(s))  Blood Culture (routine x 2)     Status: None   Collection Time: 02/12/2022  6:51 PM   Specimen: Right Antecubital; Blood  Result Value Ref Range Status   Specimen Description RIGHT ANTECUBITAL  Final   Special Requests   Final    BOTTLES DRAWN AEROBIC AND ANAEROBIC Blood Culture adequate volume   Culture   Final    NO GROWTH 5 DAYS Performed at [facility], [address] [place], [place] [address]    Report Status 03/04/2022 FINAL  Final  Blood Culture (routine x 2)     Status: None   Collection Time: 03/02/2022  8:24 PM   Specimen: BLOOD RIGHT HAND  Result Value Ref Range Status   Specimen Description BLOOD RIGHT HAND  Final   Special Requests   Final    BOTTLES DRAWN AEROBIC ONLY  Blood Culture adequate volume   Culture   Final    NO GROWTH 5 DAYS Performed at [facility], [address] [place], [place] [address]    Report Status 03/04/2022 FINAL  Final  Body fluid culture w Gram Stain     Status: None   Collection Time: 02/28/22  6:59 PM   Specimen: Peritoneal Washings; Body Fluid  Result Value Ref Range Status   Specimen Description PERITONEAL  Final   Special Requests Normal  Final  Gram Stain   Final    RARE WBC PRESENT, PREDOMINANTLY MONONUCLEAR NO ORGANISMS SEEN    Culture   Final    NO GROWTH 3 DAYS Performed at [facility] [address] [place], [place] [address]    Report Status 03/04/2022 FINAL  Final  Aerobic/Anaerobic Culture w Gram Stain (surgical/deep wound)     Status: None (Preliminary result)   Collection Time: 03/02/22 12:15 PM   Specimen: Wound  Result Value Ref Range Status   Specimen Description WOUND  Final   Special Requests LEFT LEG  Final   Gram Stain   Final    NO SQUAMOUS EPITHELIAL CELLS SEEN NO WBC SEEN NO ORGANISMS SEEN    Culture   Final    NO GROWTH 2 DAYS Performed at [facility], [place] [address] [place], [place] [address]    Report Status PENDING  Incomplete         Radiology Studies: DG HIP UNILAT WITH PELVIS 2-3 VIEWS RIGHT  Result Date: 03/05/2022 CLINICAL DATA:  Fall EXAM: DG HIP (WITH OR WITHOUT PELVIS) 2-3V RIGHT COMPARISON:  None FINDINGS: Osseous demineralization. Hip and SI joint spaces preserved. No acute fracture, dislocation, or bone destruction. Degenerative disc and facet disease changes at lower lumbar spine IMPRESSION: No acute osseous abnormalities. Electronically Signed   By: Lavonia Dana M.D.   On: 03/05/2022 12:17        Scheduled Meds:  Chlorhexidine Gluconate Cloth  6 each Topical Q0600   gabapentin  100 mg Oral QHS   gentamicin cream  1 Application Topical Daily   heparin  5,000 Units Subcutaneous Q8H   lidocaine  2 patch Transdermal Q24H   linezolid  600 mg  Oral Q12H   midodrine  15 mg Oral TID WC   nystatin  5 mL Oral TID   pantoprazole  40 mg Oral Daily   sevelamer carbonate  1,600 mg Oral TID WC   sodium bicarbonate  1,300 mg Oral BID   Continuous Infusions:  dialysis solution 1.5% low-MG/low-CA Stopped (03/03/22 1410)   dialysis solution 2.5% low-MG/low-CA Stopped (03/03/22 1410)   dialysis solution 4.25% low-MG/low-CA Stopped (03/03/22 1410)     LOS: 5 days    Time spent: 45 minutes spent on chart review, discussion with nursing staff, consultants, updating family and interview/physical exam; more than 50% of that time was spent in counseling and/or coordination of care.    Geradine Girt, DO [facility] Available via Epic secure chat 7am-7pm After these hours, please refer to coverage provider listed on [email or web address] 03/05/2022, 12:52 PM

## 2022-03-05 NOTE — Progress Notes (Signed)
While ambulating pt to bathroom- she reports lower back pain- since fall earlier- acknowledges hx of back problems- notified [facility] on call- just the same- pt in no acute distress

## 2022-03-06 ENCOUNTER — Encounter (HOSPITAL_COMMUNITY): Payer: Self-pay

## 2022-03-06 DIAGNOSIS — L0390 Cellulitis, unspecified: Secondary | ICD-10-CM | POA: Diagnosis not present

## 2022-03-06 DIAGNOSIS — A419 Sepsis, unspecified organism: Secondary | ICD-10-CM | POA: Diagnosis not present

## 2022-03-06 LAB — RENAL FUNCTION PANEL
Albumin: 1.5 g/dL — ABNORMAL LOW (ref 3.5–5.0)
Anion gap: 20 — ABNORMAL HIGH (ref 5–15)
BUN: 74 mg/dL — ABNORMAL HIGH (ref 6–20)
CO2: 17 mmol/L — ABNORMAL LOW (ref 22–32)
Calcium: 7.8 mg/dL — ABNORMAL LOW (ref 8.9–10.3)
Chloride: 88 mmol/L — ABNORMAL LOW (ref 98–111)
Creatinine, Ser: 7.81 mg/dL — ABNORMAL HIGH (ref 0.44–1.00)
GFR, Estimated: 6 mL/min — ABNORMAL LOW (ref >60)
Glucose, Bld: 93 mg/dL (ref 70–99)
Phosphorus: 9.8 mg/dL — ABNORMAL HIGH (ref 2.5–4.6)
Potassium: 3.8 mmol/L (ref 3.5–5.1)
Sodium: 125 mmol/L — ABNORMAL LOW (ref 135–145)

## 2022-03-06 MED ORDER — SEVELAMER CARBONATE 800 MG PO TABS
2400.0000 mg | ORAL_TABLET | Freq: Three times a day (TID) | ORAL | Status: DC
  Administered 2022-03-06 – 2022-03-10 (×14): 2400 mg via ORAL
  Filled 2022-03-06 (×14): qty 3

## 2022-03-06 NOTE — Progress Notes (Addendum)
PROGRESS NOTE    Lauren Osborn  UTM:[id number] DOB: 01-07-1976 DOA: 02/09/2022 PCP: Carrolyn Meiers, MD    Brief Narrative:  Lauren Osborn is a 46 y.o. female with medical history significant of ESRD on peritoneal dialysis, diastolic congestive heart failure, essential hypertension, GERD, liver hemangioma, morbid obesity, thyroid nodule, and more presents to ED with a chief complaint of left leg pain.   Found to have a cellulitis.  Slow to improve.  Had a fall on 7/1.  BP remains low.   Assessment and Plan: Sepsis due to cellulitis [facility]) - Patient had a heart rate of 92, respiratory rate of 23, leukocytosis of 13, blood pressure as low as 78/50, lactic acidosis 2.0 -Failed outpatient treatment for cellulitis with cefdinir and Doxy -Chest x-ray shows no cardiopulmonary disease -X-ray tib-fib shows extensive subcutaneous edema.  No subcutaneous gas identified.  No evidence of osteomyelitis. - ID consult zyvox: appear to be group a strep cellulitis/impetigo; plan for 10 days -Blood cultures NGTD -Continue to monitor -WOC consult appreciated  ESRD (end stage renal disease) ([place]) - ESRD since December 22 -Was initially on HD with a chest port, left AV fistula in place -Currently doing peritoneal dialysis at home-- missed PD Friday night -Nephrology  Hypotension - Chronic hypertension with patient and family reporting her blood pressures never above 100 -Continue midodrine dose from home 15 mg 3 times daily -check AM cortisol -not symptomatic   GERD (gastroesophageal reflux disease) - Continue Protonix  Hyponatremia -probably from volume overload vs poor Po intake -daily labs  Hypomagnesemia -repleted  Fall -xray negative for fracture  DVT prophylaxis: heparin injection 5,000 Units Start: 02/28/22 0600 SCDs Start: 02/28/22 0245    Code Status: Full Code Family Communication:   Disposition Plan:  Level of care: Med-Surg Status is: Inpatient Remains inpatient  appropriate because: home once Na better and N/V controlled    Consultants:  Nephrology ID   Subjective: Is feeling all over achy  Objective: Vitals:   03/05/22 2348 03/06/22 0342 03/06/22 1045 03/06/22 1235  BP: (!) 72/38 (!) 70/49 (!) 66/38 (!) 60/43  Pulse: 82 75 71   Resp: '20 20 14   '$ Temp: 97.6 F (36.4 C) 97.9 F (36.6 C)    TempSrc: Oral Oral    SpO2: 99% 99%    Weight:      Height:        Intake/Output Summary (Last 24 hours) at 03/06/2022 1333 Last data filed at 03/05/2022 2000 Gross per 24 hour  Intake 240 ml  Output --  Net 240 ml    Filed Weights   03/03/2022 1754  Weight: 89.4 kg    Examination:   General: Appearance:    Obese female in no acute distress     Lungs:     respirations unlabored  Heart:    Normal heart rate. Normal rhythm. No murmurs, rubs, or gallops.   MS:   Leg wrapped-- reviewed photo that patient took of her leg this AM-- appears to be healing  Neurologic:   Awake, alert, oriented x 3. No apparent focal neurological           defect.          Data Reviewed: I have personally reviewed following labs and imaging studies  CBC: Recent Labs  Lab 02/09/2022 1846 02/28/22 0354 03/01/22 0705 03/02/22 0733 03/03/22 0209 03/05/22 0106  WBC 13.0* 14.3* 13.6* 13.2* 12.6* 15.4*  NEUTROABS 10.4* 11.5*  --   --   --   --  HGB 11.0* 11.9* 11.9* 11.1* 10.8* 11.2*  HCT 32.4* 33.6* 34.5* 32.1* 31.7* 32.0*  MCV 95.6 94.1 94.0 93.0 95.2 91.7  PLT 521* 516* PLATELET CLUMPS NOTED ON SMEAR, UNABLE TO ESTIMATE 484* 417* 283*   Basic Metabolic Panel: Recent Labs  Lab 02/28/22 0354 03/01/22 0705 03/03/22 0209 03/04/22 0136 03/05/22 0106 03/06/22 0244  NA 133* 130* 125* 124* 126* 125*  K 3.6 3.7 3.5 3.8 3.3* 3.8  CL 99 93* 92* 90* 91* 88*  CO2 16* 18* 14* 16* 15* 17*  GLUCOSE 79 100* 105* 90 104* 93  BUN 64* 70* 70* 75* 75* 74*  CREATININE 6.68* 6.94* 7.10* 7.38* 7.71* 7.81*  CALCIUM 7.4* 7.8* 7.5* 7.8* 8.1* 7.8*  MG 1.0* 1.8 1.6* 2.0  2.0  --   PHOS 7.5*  --  8.5* 9.3* 9.9* 9.8*   GFR: Estimated Creatinine Clearance: 9.4 mL/min (A) (by C-G formula based on SCr of 7.81 mg/dL (H)). Liver Function Tests: Recent Labs  Lab 02/28/22 0354 03/03/22 0209 03/04/22 0136 03/05/22 0106 03/06/22 0244  AST 63*  --   --   --   --   ALT 31  --   --   --   --   ALKPHOS 274*  --   --   --   --   BILITOT 1.1  --   --   --   --   PROT 5.7*  --   --   --   --   ALBUMIN 1.9* 1.5* 1.6* 1.6* 1.5*   No results for input(s): "LIPASE", "AMYLASE" in the last 168 hours. No results for input(s): "AMMONIA" in the last 168 hours. Coagulation Profile: Recent Labs  Lab 02/18/2022 1846 02/28/22 0354  INR 1.3* 1.3*   Cardiac Enzymes: No results for input(s): "CKTOTAL", "CKMB", "CKMBINDEX", "TROPONINI" in the last 168 hours. BNP (last 3 results) No results for input(s): "PROBNP" in the last 8760 hours. HbA1C: No results for input(s): "HGBA1C" in the last 72 hours. CBG: No results for input(s): "GLUCAP" in the last 168 hours. Lipid Profile: No results for input(s): "CHOL", "HDL", "LDLCALC", "TRIG", "CHOLHDL", "LDLDIRECT" in the last 72 hours. Thyroid Function Tests: No results for input(s): "TSH", "T4TOTAL", "FREET4", "T3FREE", "THYROIDAB" in the last 72 hours. Anemia Panel: No results for input(s): "VITAMINB12", "FOLATE", "FERRITIN", "TIBC", "IRON", "RETICCTPCT" in the last 72 hours. Sepsis Labs: Recent Labs  Lab 02/12/2022 1851 02/26/2022 2024 02/28/22 0354  PROCALCITON  --   --  1.70  LATICACIDVEN 1.3 2.0* 2.6*    Recent Results (from the past 240 hour(s))  Blood Culture (routine x 2)     Status: None   Collection Time: 02/02/2022  6:51 PM   Specimen: Right Antecubital; Blood  Result Value Ref Range Status   Specimen Description RIGHT ANTECUBITAL  Final   Special Requests   Final    BOTTLES DRAWN AEROBIC AND ANAEROBIC Blood Culture adequate volume   Culture   Final    NO GROWTH 5 DAYS Performed at [facility], [address], [place], [place] [address]    Report Status 03/04/2022 FINAL  Final  Blood Culture (routine x 2)     Status: None   Collection Time: 02/19/2022  8:24 PM   Specimen: BLOOD RIGHT HAND  Result Value Ref Range Status   Specimen Description BLOOD RIGHT HAND  Final   Special Requests   Final    BOTTLES DRAWN AEROBIC ONLY Blood Culture adequate volume   Culture   Final  NO GROWTH 5 DAYS Performed at [facility], [address], [place], [place] [address]    Report Status 03/04/2022 FINAL  Final  Body fluid culture w Gram Stain     Status: None   Collection Time: 02/28/22  6:59 PM   Specimen: Peritoneal Washings; Body Fluid  Result Value Ref Range Status   Specimen Description PERITONEAL  Final   Special Requests Normal  Final   Gram Stain   Final    RARE WBC PRESENT, PREDOMINANTLY MONONUCLEAR NO ORGANISMS SEEN    Culture   Final    NO GROWTH 3 DAYS Performed at [facility], [address], [place], [place] [address]    Report Status 03/04/2022 FINAL  Final  Aerobic/Anaerobic Culture w Gram Stain (surgical/deep wound)     Status: None (Preliminary result)   Collection Time: 03/02/22 12:15 PM   Specimen: Wound  Result Value Ref Range Status   Specimen Description WOUND  Final   Special Requests LEFT LEG  Final   Gram Stain   Final    NO SQUAMOUS EPITHELIAL CELLS SEEN NO WBC SEEN NO ORGANISMS SEEN    Culture   Final    NO GROWTH 4 DAYS NO ANAEROBES ISOLATED; CULTURE IN PROGRESS FOR 5 DAYS Performed at [facility], [place] [address], [place],  [address]    Report Status PENDING  Incomplete         Radiology Studies: DG HIP UNILAT WITH PELVIS 2-3 VIEWS RIGHT  Result Date: 03/05/2022 CLINICAL DATA:  Fall EXAM: DG HIP (WITH OR WITHOUT PELVIS) 2-3V RIGHT COMPARISON:  None FINDINGS: Osseous demineralization. Hip and SI joint spaces preserved. No acute fracture, dislocation, or bone destruction. Degenerative disc and facet disease changes at lower lumbar spine  IMPRESSION: No acute osseous abnormalities. Electronically Signed   By: Lavonia Dana M.D.   On: 03/05/2022 12:17        Scheduled Meds:  Chlorhexidine Gluconate Cloth  6 each Topical Q0600   gabapentin  100 mg Oral QHS   gentamicin cream  1 Application Topical Daily   heparin  5,000 Units Subcutaneous Q8H   lidocaine  2 patch Transdermal Q24H   linezolid  600 mg Oral Q12H   midodrine  15 mg Oral TID WC   nystatin  5 mL Oral TID   pantoprazole  40 mg Oral Daily   sevelamer carbonate  2,400 mg Oral TID WC   sodium bicarbonate  1,300 mg Oral BID   Continuous Infusions:  dialysis solution 1.5% low-MG/low-CA     dialysis solution 2.5% low-MG/low-CA     dialysis solution 4.25% low-MG/low-CA       LOS: 6 days    Time spent: 45 minutes spent on chart review, discussion with nursing staff, consultants, updating family and interview/physical exam; more than 50% of that time was spent in counseling and/or coordination of care.    Geradine Girt, DO [facility] Available via Epic secure chat 7am-7pm After these hours, please refer to coverage provider listed on [email or web address] 03/06/2022, 1:33 PM

## 2022-03-06 NOTE — Progress Notes (Signed)
Mobility Specialist Progress Note   03/06/22 1023  Mobility  Activity Ambulated with assistance to bathroom  Level of Assistance Contact guard assist, steadying assist  Assistive Device Front wheel walker  Distance Ambulated (ft) 14 ft  Activity Response Tolerated well  $Mobility charge 1 Mobility   Post Mobility: 74 HR, 57/33 BP  Received pt in BR requesting assistance BTB. Deferring mobility session d/t intolerable pain and major dizziness. Returned BTB w/o fault, BP taken and abnormally low. RN notified about symptoms and vitals.    Holland Falling Mobility Specialist [facility] #:  [phone] Acute Rehab Office:  [phone]

## 2022-03-06 NOTE — Plan of Care (Signed)
PD going all night without issues. No acute events overnight.    Problem: Education: Goal: Knowledge of General Education information will improve Description: Including pain rating scale, medication(s)/side effects and non-pharmacologic comfort measures Outcome: Progressing   Problem: Health Behavior/Discharge Planning: Goal: Ability to manage health-related needs will improve Outcome: Progressing   Problem: Clinical Measurements: Goal: Ability to maintain clinical measurements within normal limits will improve Outcome: Progressing Goal: Will remain free from infection Outcome: Progressing Goal: Diagnostic test results will improve Outcome: Progressing Goal: Respiratory complications will improve Outcome: Progressing Goal: Cardiovascular complication will be avoided Outcome: Progressing   Problem: Activity: Goal: Risk for activity intolerance will decrease Outcome: Progressing   Problem: Nutrition: Goal: Adequate nutrition will be maintained Outcome: Progressing   Problem: Coping: Goal: Level of anxiety will decrease Outcome: Progressing   Problem: Elimination: Goal: Will not experience complications related to bowel motility Outcome: Progressing Goal: Will not experience complications related to urinary retention Outcome: Progressing   Problem: Pain Managment: Goal: General experience of comfort will improve Outcome: Progressing   Problem: Safety: Goal: Ability to remain free from injury will improve Outcome: Progressing   Problem: Skin Integrity: Goal: Risk for impaired skin integrity will decrease Outcome: Progressing   Problem: Fluid Volume: Goal: Hemodynamic stability will improve Outcome: Progressing   Problem: Clinical Measurements: Goal: Diagnostic test results will improve Outcome: Progressing Goal: Signs and symptoms of infection will decrease Outcome: Progressing   Problem: Respiratory: Goal: Ability to maintain adequate ventilation will  improve Outcome: Progressing

## 2022-03-06 NOTE — Plan of Care (Signed)

## 2022-03-06 NOTE — Progress Notes (Addendum)
Patient ID: Lauren Osborn, female   DOB: 10/28/1975, 46 y.o.   MRN: [id number] [facility] Progress Note   Assessment/ Plan:   1.  Left leg cellulitis/sepsis: Failed outpatient antibiotic therapy with cefdinir/doxycycline and currently on linezolid with evidence of streptococcal cellulitis/impetigo.  No evidence of bacteremia. 2. ESRD: Continue peritoneal dialysis on current prescription with ongoing sodium bicarbonate supplementation and efforts at ultrafiltration/fluid restriction for management of hyponatremia. 3. Anemia: Hemoglobin and hematocrit currently at goal, will continue to monitor for ESA indications. 4. CKD-MBD: Significantly elevated phosphorus levels, will increase sevelamer dose to 2.4 g 3 times daily with meals. 5. Nutrition: Continue ongoing nutritional protein supplementation in the setting of infection/PD. 6. Hypertension: Blood pressures appear to be low, continue midodrine 15 mg 3 times daily.  Subjective:   Reports that she is feeling somewhat better but limited in her mobility because of fall precautions.   Objective:   BP (!) 70/49 (BP Location: Right Arm)   Pulse 75   Temp 97.9 F (36.6 C) (Oral)   Resp 20   Ht '5\' 3"'$  (1.6 m)   Wt 89.4 kg   SpO2 99%   BMI 34.90 kg/m   Physical Exam: Gen: Appears uncomfortable sitting on the edge of her bed CVS: Pulse regular rhythm, normal rate, S1 and S2 normal Resp: Clear to auscultation bilaterally, no rales/rhonchi Abd: Soft, obese, PD catheter in place, nontender Ext: 1+-2+ edema in the lower extremities with left leg in Ace wrap.  Hemangioma lateral aspect of left leg.  Left upper arm AVF with palpable thrill  Labs: BMET Recent Labs  Lab 02/28/2022 1846 02/28/22 0354 03/01/22 0705 03/03/22 0209 03/04/22 0136 03/05/22 0106 03/06/22 0244  NA 129* 133* 130* 125* 124* 126* 125*  K 3.5 3.6 3.7 3.5 3.8 3.3* 3.8  CL 96* 99 93* 92* 90* 91* 88*  CO2 19* 16* 18* 14* 16* 15* 17*  GLUCOSE 88 79 100*  105* 90 104* 93  BUN 62* 64* 70* 70* 75* 75* 74*  CREATININE 6.23* 6.68* 6.94* 7.10* 7.38* 7.71* 7.81*  CALCIUM 6.8* 7.4* 7.8* 7.5* 7.8* 8.1* 7.8*  PHOS  --  7.5*  --  8.5* 9.3* 9.9* 9.8*   CBC Recent Labs  Lab 02/06/2022 1846 02/28/22 0354 03/01/22 0705 03/02/22 0733 03/03/22 0209 03/05/22 0106  WBC 13.0* 14.3* 13.6* 13.2* 12.6* 15.4*  NEUTROABS 10.4* 11.5*  --   --   --   --   HGB 11.0* 11.9* 11.9* 11.1* 10.8* 11.2*  HCT 32.4* 33.6* 34.5* 32.1* 31.7* 32.0*  MCV 95.6 94.1 94.0 93.0 95.2 91.7  PLT 521* 516* PLATELET CLUMPS NOTED ON SMEAR, UNABLE TO ESTIMATE 484* 417* 441*     Medications:     Chlorhexidine Gluconate Cloth  6 each Topical Q0600   gabapentin  100 mg Oral QHS   gentamicin cream  1 Application Topical Daily   heparin  5,000 Units Subcutaneous Q8H   lidocaine  2 patch Transdermal Q24H   linezolid  600 mg Oral Q12H   midodrine  15 mg Oral TID WC   nystatin  5 mL Oral TID   pantoprazole  40 mg Oral Daily   sevelamer carbonate  1,600 mg Oral TID WC   sodium bicarbonate  1,300 mg Oral BID   Elmarie Shiley, MD 03/06/2022, 9:14 AM

## 2022-03-07 DIAGNOSIS — L0390 Cellulitis, unspecified: Secondary | ICD-10-CM | POA: Diagnosis not present

## 2022-03-07 DIAGNOSIS — A419 Sepsis, unspecified organism: Secondary | ICD-10-CM | POA: Diagnosis not present

## 2022-03-07 LAB — AEROBIC/ANAEROBIC CULTURE W GRAM STAIN (SURGICAL/DEEP WOUND)
Culture: NO GROWTH
Gram Stain: NONE SEEN

## 2022-03-07 LAB — CORTISOL: Cortisol, Plasma: 34.5 ug/dL

## 2022-03-07 MED ORDER — MIDODRINE HCL 5 MG PO TABS
20.0000 mg | ORAL_TABLET | Freq: Three times a day (TID) | ORAL | Status: DC
  Administered 2022-03-07 – 2022-03-12 (×14): 20 mg via ORAL
  Filled 2022-03-07 (×14): qty 4

## 2022-03-07 MED ORDER — DELFLEX-LC/2.5% DEXTROSE 394 MOSM/L IP SOLN: INTRAPERITONEAL | Status: DC

## 2022-03-07 MED ORDER — DELFLEX-LC/4.25% DEXTROSE 483 MOSM/L IP SOLN: INTRAPERITONEAL | Status: DC

## 2022-03-07 MED ORDER — METOCLOPRAMIDE HCL 5 MG/ML IJ SOLN
5.0000 mg | Freq: Four times a day (QID) | INTRAMUSCULAR | Status: AC
Start: 2022-03-07 — End: 2022-03-07
  Administered 2022-03-07 (×2): 5 mg via INTRAVENOUS
  Filled 2022-03-07 (×2): qty 2

## 2022-03-07 NOTE — TOC Initial Note (Signed)
Transition of Care [facility]) - Initial/Assessment Note    Patient Details  Name: Lauren Osborn MRN: [id number] Date of Birth: 1976-06-07  Transition [facility]) CM/SW Contact:    Marilu Favre, RN Phone Number: 03/07/2022, 8:30 AM  Clinical Narrative:                 Spoke to patient at bedside . Patient confirmed face sheet information. From home with daughter and son in law. Patient already has shower chair, walker, bedside commode, and PD equipment.   Home health arranged with [facility] , asked MD to add [facility] order to HHPT order.   Expected Discharge Plan: [place] Barriers to Discharge: Continued Medical Work up   Patient Goals and CMS Choice Patient states their goals for this hospitalization and ongoing recovery are:: to return to home CMS Medicare.gov Compare Post Acute Care list provided to:: Patient Choice offered to / list presented to : Patient  Expected Discharge Plan and Services Expected Discharge Plan: [place]   Discharge Planning Services: CM Consult Post Acute Care Choice: [place] arrangements for the past 2 months: Single Family Home                 DME Arranged: N/A         HH Arranged: RN, PT [place] Agency: [place] Date [facility] Agency Contacted: 03/07/22 Time HH Agency Contacted: 0830 Representative spoke with at [place]: Cindie  Prior Living Arrangements/Services Living arrangements for the past 2 months: [place] Lives with:: Adult Children Patient language and need for interpreter reviewed:: Yes Do you feel safe going back to the place where you live?: Yes      Need for Family Participation in Patient Care: Yes (Comment) Care giver support system in place?: Yes (comment) Current home services: DME Criminal Activity/Legal Involvement Pertinent to Current Situation/Hospitalization: No - Comment as needed  Activities of Daily Living Home Assistive Devices/Equipment: None ADL  Screening (condition at time of admission) Patient's cognitive ability adequate to safely complete daily activities?: Yes Is the patient deaf or have difficulty hearing?: No Does the patient have difficulty seeing, even when wearing glasses/contacts?: No Does the patient have difficulty concentrating, remembering, or making decisions?: No Patient able to express need for assistance with ADLs?: Yes Does the patient have difficulty dressing or bathing?: No Independently performs ADLs?: Yes (appropriate for developmental age) Does the patient have difficulty walking or climbing stairs?: No Weakness of Legs: None Weakness of Arms/Hands: None  Permission Sought/Granted   Permission granted to share information with : No              Emotional Assessment Appearance:: Appears stated age Attitude/Demeanor/Rapport: Engaged Affect (typically observed): Accepting Orientation: : Oriented to Self, Oriented to Place, Oriented to  Time, Oriented to Situation Alcohol / Substance Use: Not Applicable Psych Involvement: No (comment)  Admission diagnosis:  Cellulitis [L03.90] Cellulitis of left leg [L03.116] Chronic renal failure, unspecified CKD stage [N18.9] Patient Active Problem List   Diagnosis Date Noted   Cellulitis of left leg    Hypotension 02/28/2022   ESRD (end stage renal disease) ([place]) 02/28/2022   Cellulitis 02/28/2022   Sepsis due to cellulitis ([place]) 02/10/2022   Cardiomegaly 09/19/2021   Essential hypertension 09/19/2021   Iron deficiency anemia 09/19/2021   Abdominal pain 07/23/2021   Vitamin D deficiency 07/23/2021   Hepatic cirrhosis ([place]) 07/23/2021   Hepatic hemangioma 07/23/2021   Right upper  quadrant abdominal pain 07/22/2021   Rectal bleeding 07/22/2021   Shortness of breath 07/22/2021   Acute exacerbation of CHF (congestive heart failure) ([place]) 07/22/2021   CKD (chronic kidney disease), stage IV ([place]) 06/23/2021   Liver lesion 12/01/2020   Liver disease  12/01/2020   Elevated bilirubin 12/01/2020   OSA (obstructive sleep apnea) 11/16/2020   Acute on chronic heart failure with preserved ejection fraction (HFpEF) ([place]) 09/22/2020   Morbid obesity with BMI of 50.0-59.9, adult ([place]) 09/22/2020   Heart failure, unspecified ([place]) 09/22/2020   Pulmonary edema 09/16/2020   Hypokalemia 09/16/2020   Prolonged QT interval 09/16/2020   Acute kidney injury ([place]) 09/16/2020   Elevated brain natriuretic peptide (BNP) level 09/16/2020   GERD (gastroesophageal reflux disease) 09/16/2020   Anasarca 09/16/2020   Super obese 09/16/2020   Acute kidney failure, unspecified ([place]) 09/16/2020   Normocytic anemia 04/23/2020   Family history of colon cancer 04/23/2020   Mass of upper outer quadrant of right breast 11/04/2019   S/P partial thyroidectomy 10/21/2018   Visit for routine gyn exam 07/03/2018   Fingertip amputation, initial encounter 10/22/2017   PCP:  Carrolyn Meiers, MD Pharmacy:   [id number] [place]Phone: [phone]: [phone] [facility] [address] [place] [place]Phone: [phone]: [phone]    Social Determinants of Health (SDOH) Interventions    Readmission Risk Interventions    07/23/2021   11:42 AM 06/24/2021    2:26 PM  Readmission Risk Prevention Plan  Transportation Screening Complete Complete  Home Care Screening Complete Complete  Medication Review (RN CM) Complete Complete

## 2022-03-07 NOTE — Progress Notes (Addendum)
PROGRESS NOTE    Lauren Osborn  EUM:[id number] DOB: 1975-10-16 DOA: 02/07/2022 PCP: Carrolyn Meiers, MD    Brief Narrative:  Lauren Osborn is a 46 y.o. female with medical history significant of ESRD on peritoneal dialysis, diastolic congestive heart failure, essential hypertension, GERD, liver hemangioma, morbid obesity, thyroid nodule, and more presents to ED with a chief complaint of left leg pain.   Found to have a cellulitis.  Slow to improve.  Had a fall on 7/1.  BP remains low and pain continues   Assessment and Plan: Sepsis due to cellulitis [facility]) - Patient had a heart rate of 92, respiratory rate of 23, leukocytosis of 13, blood pressure as low as 78/50, lactic acidosis 2.0 -Failed outpatient treatment for cellulitis with cefdinir and Doxy -Chest x-ray shows no cardiopulmonary disease -X-ray tib-fib shows extensive subcutaneous edema.  No subcutaneous gas identified.  No evidence of osteomyelitis. - ID consult zyvox: appear to be group a strep cellulitis/impetigo; plan for 10 days -Blood cultures NGTD -Continue to monitor -WOC consult appreciated    ESRD (end stage renal disease) ([place]) - ESRD since December 22 -Was initially on HD with a chest port, left AV fistula in place -Currently doing peritoneal dialysis at home-- missed PD Friday night -Nephrology  Hypotension - Chronic hypertension with patient and family reporting her blood pressures never above 100 -increased midodrine -AM cortisol not low -not symptomatic   GERD (gastroesophageal reflux disease) - Continue Protonix  Hyponatremia -probably from volume overload vs poor Po intake -daily labs  Hypomagnesemia -repleted  Fall -xray negative for fracture in hip  Reported N/V -unclear etiology -having BMs -trial of reglan x 2 doses  DVT prophylaxis: heparin injection 5,000 Units Start: 02/28/22 0600 SCDs Start: 02/28/22 0245    Disposition Plan:  Level of care: Med-Surg Status is:  Inpatient Remains inpatient appropriate because: home once Na better and N/V controlled    Consultants:  Nephrology ID   Subjective: Vomited again last night  Objective: Vitals:   03/06/22 2011 03/07/22 0446 03/07/22 0820 03/07/22 1253  BP: (!) 76/41 (!) 62/31 (!) 62/35 (!) 78/56  Pulse: 77 88 86 90  Resp: '19 17 16 16  '$ Temp: 97.6 F (36.4 C) 98.3 F (36.8 C) 97.8 F (36.6 C) 98.5 F (36.9 C)  TempSrc: Oral     SpO2: 95% 100% 100% 98%  Weight:      Height:        Intake/Output Summary (Last 24 hours) at 03/07/2022 1307 Last data filed at 03/07/2022 0900 Gross per 24 hour  Intake 240 ml  Output --  Net 240 ml    Filed Weights   02/22/2022 1754  Weight: 89.4 kg    Examination:   General: Appearance:    Obese female in no acute distress     Lungs:     respirations unlabored  Heart:    Normal heart rate. Normal rhythm. No murmurs, rubs, or gallops.   MS:      Neurologic:   Awake, alert, oriented x 3. No apparent focal neurological           defect.            Data Reviewed: I have personally reviewed following labs and imaging studies  CBC: Recent Labs  Lab 03/01/22 0705 03/02/22 0733 03/03/22 0209 03/05/22 0106  WBC 13.6* 13.2* 12.6* 15.4*  HGB 11.9* 11.1* 10.8* 11.2*  HCT 34.5* 32.1* 31.7* 32.0*  MCV 94.0 93.0 95.2 91.7  PLT PLATELET  CLUMPS NOTED ON SMEAR, UNABLE TO ESTIMATE 484* 417* 509*   Basic Metabolic Panel: Recent Labs  Lab 03/01/22 0705 03/03/22 0209 03/04/22 0136 03/05/22 0106 03/06/22 0244  NA 130* 125* 124* 126* 125*  K 3.7 3.5 3.8 3.3* 3.8  CL 93* 92* 90* 91* 88*  CO2 18* 14* 16* 15* 17*  GLUCOSE 100* 105* 90 104* 93  BUN 70* 70* 75* 75* 74*  CREATININE 6.94* 7.10* 7.38* 7.71* 7.81*  CALCIUM 7.8* 7.5* 7.8* 8.1* 7.8*  MG 1.8 1.6* 2.0 2.0  --   PHOS  --  8.5* 9.3* 9.9* 9.8*   GFR: Estimated Creatinine Clearance: 9.4 mL/min (A) (by C-G formula based on SCr of 7.81 mg/dL (H)). Liver Function Tests: Recent Labs  Lab  03/03/22 0209 03/04/22 0136 03/05/22 0106 03/06/22 0244  ALBUMIN 1.5* 1.6* 1.6* 1.5*   No results for input(s): "LIPASE", "AMYLASE" in the last 168 hours. No results for input(s): "AMMONIA" in the last 168 hours. Coagulation Profile: No results for input(s): "INR", "PROTIME" in the last 168 hours.  Cardiac Enzymes: No results for input(s): "CKTOTAL", "CKMB", "CKMBINDEX", "TROPONINI" in the last 168 hours. BNP (last 3 results) No results for input(s): "PROBNP" in the last 8760 hours. HbA1C: No results for input(s): "HGBA1C" in the last 72 hours. CBG: No results for input(s): "GLUCAP" in the last 168 hours. Lipid Profile: No results for input(s): "CHOL", "HDL", "LDLCALC", "TRIG", "CHOLHDL", "LDLDIRECT" in the last 72 hours. Thyroid Function Tests: No results for input(s): "TSH", "T4TOTAL", "FREET4", "T3FREE", "THYROIDAB" in the last 72 hours. Anemia Panel: No results for input(s): "VITAMINB12", "FOLATE", "FERRITIN", "TIBC", "IRON", "RETICCTPCT" in the last 72 hours. Sepsis Labs: No results for input(s): "PROCALCITON", "LATICACIDVEN" in the last 168 hours.   Recent Results (from the past 240 hour(s))  Blood Culture (routine x 2)     Status: None   Collection Time: 02/22/2022  6:51 PM   Specimen: Right Antecubital; Blood  Result Value Ref Range Status   Specimen Description RIGHT ANTECUBITAL  Final   Special Requests   Final    BOTTLES DRAWN AEROBIC AND ANAEROBIC Blood Culture adequate volume   Culture   Final    NO GROWTH 5 DAYS Performed at [facility], [address], [place], [place] [address]    Report Status 03/04/2022 FINAL  Final  Blood Culture (routine x 2)     Status: None   Collection Time: 02/18/2022  8:24 PM   Specimen: BLOOD RIGHT HAND  Result Value Ref Range Status   Specimen Description BLOOD RIGHT HAND  Final   Special Requests   Final    BOTTLES DRAWN AEROBIC ONLY Blood Culture adequate volume   Culture   Final    NO GROWTH 5 DAYS Performed at [facility], [address], [place], [place] [address]    Report Status 03/04/2022 FINAL  Final  Body fluid culture w Gram Stain     Status: None   Collection Time: 02/28/22  6:59 PM   Specimen: Peritoneal Washings; Body Fluid  Result Value Ref Range Status   Specimen Description PERITONEAL  Final   Special Requests Normal  Final   Gram Stain   Final    RARE WBC PRESENT, PREDOMINANTLY MONONUCLEAR NO ORGANISMS SEEN    Culture   Final    NO GROWTH 3 DAYS Performed at [facility], [address], [place], [place] [address]    Report Status 03/04/2022 FINAL  Final  Aerobic/Anaerobic Culture w Gram Stain (surgical/deep wound)  Status: None (Preliminary result)   Collection Time: 03/02/22 12:15 PM   Specimen: Wound  Result Value Ref Range Status   Specimen Description WOUND  Final   Special Requests LEFT LEG  Final   Gram Stain   Final    NO SQUAMOUS EPITHELIAL CELLS SEEN NO WBC SEEN NO ORGANISMS SEEN    Culture   Final    NO GROWTH 4 DAYS NO ANAEROBES ISOLATED; CULTURE IN PROGRESS FOR 5 DAYS Performed at [facility], [address], [place], [place] [address]    Report Status PENDING  Incomplete         Radiology Studies: No results found.      Scheduled Meds:  Chlorhexidine Gluconate Cloth  6 each Topical Q0600   gabapentin  100 mg Oral QHS   gentamicin cream  1 Application Topical Daily   heparin  5,000 Units Subcutaneous Q8H   lidocaine  2 patch Transdermal Q24H   linezolid  600 mg Oral Q12H   midodrine  20 mg Oral TID WC   nystatin  5 mL Oral TID   pantoprazole  40 mg Oral Daily   sevelamer carbonate  2,400 mg Oral TID WC   sodium bicarbonate  1,300 mg Oral BID   Continuous Infusions:  dialysis solution 1.5% low-MG/low-CA     dialysis solution 2.5% low-MG/low-CA     dialysis solution 4.25% low-MG/low-CA       LOS: 7 days    Time spent: 45 minutes spent on chart review, discussion with nursing staff, consultants, updating family and  interview/physical exam; more than 50% of that time was spent in counseling and/or coordination of care.    Geradine Girt, DO [facility] Available via Epic secure chat 7am-7pm After these hours, please refer to coverage provider listed on [email or web address] 03/07/2022, 1:07 PM

## 2022-03-07 NOTE — Progress Notes (Signed)
Patient ID: Lauren Osborn, female   DOB: 28-Dec-1975, 46 y.o.   MRN: [id number] [name] Progress Note   Assessment/ Plan:   1.  Left leg cellulitis/sepsis: Failed outpatient antibiotic therapy with cefdinir/doxycycline and currently on linezolid with evidence of streptococcal cellulitis/impetigo.  Blood cultures negative and she remains afebrile. 2. ESRD: I will continue her peritoneal dialysis on the current prescription with ongoing sodium bicarbonate supplementation and efforts at ultrafiltration/fluid restriction for management of hyponatremia and anasarca. 3. Anemia: Hemoglobin and hematocrit currently at goal, will continue to monitor for ESA indications. 4. CKD-MBD: Significantly elevated phosphorus levels, will increase sevelamer dose to 2.4 g 3 times daily with meals. 5. Nutrition: Continue ongoing nutritional protein supplementation in the setting of infection/PD. 6. Hypotension: Blood pressures remain low and may potentially limit ultrafiltration with peritoneal dialysis-we will increase midodrine to 20 mg 3 times a day.  Subjective:   Reports that she is feeling somewhat better but limited in her mobility because of fall precautions.   Objective:   BP (!) 62/35   Pulse 86   Temp 97.8 F (36.6 C)   Resp 16   Ht '5\' 3"'$  (1.6 m)   Wt 89.4 kg   SpO2 100%   BMI 34.90 kg/m   Physical Exam: Gen: Appears comfortable resting in bed, being disconnected from PD. CVS: Pulse regular rhythm, normal rate, S1 and S2 normal Resp: Clear to auscultation bilaterally, no rales/rhonchi Abd: Soft, obese, PD catheter in place, nontender Ext: 1+-2+ edema in the lower extremities with left leg in Ace wrap.  Hemangioma lateral aspect of left leg.  Left upper arm AVF with palpable thrill  Labs: BMET Recent Labs  Lab 03/01/22 0705 03/03/22 0209 03/04/22 0136 03/05/22 0106 03/06/22 0244  NA 130* 125* 124* 126* 125*  K 3.7 3.5 3.8 3.3* 3.8  CL 93* 92* 90* 91* 88*  CO2 18*  14* 16* 15* 17*  GLUCOSE 100* 105* 90 104* 93  BUN 70* 70* 75* 75* 74*  CREATININE 6.94* 7.10* 7.38* 7.71* 7.81*  CALCIUM 7.8* 7.5* 7.8* 8.1* 7.8*  PHOS  --  8.5* 9.3* 9.9* 9.8*   CBC Recent Labs  Lab 03/01/22 0705 03/02/22 0733 03/03/22 0209 03/05/22 0106  WBC 13.6* 13.2* 12.6* 15.4*  HGB 11.9* 11.1* 10.8* 11.2*  HCT 34.5* 32.1* 31.7* 32.0*  MCV 94.0 93.0 95.2 91.7  PLT PLATELET CLUMPS NOTED ON SMEAR, UNABLE TO ESTIMATE 484* 417* 441*     Medications:     Chlorhexidine Gluconate Cloth  6 each Topical Q0600   gabapentin  100 mg Oral QHS   gentamicin cream  1 Application Topical Daily   heparin  5,000 Units Subcutaneous Q8H   lidocaine  2 patch Transdermal Q24H   linezolid  600 mg Oral Q12H   midodrine  20 mg Oral TID WC   nystatin  5 mL Oral TID   pantoprazole  40 mg Oral Daily   sevelamer carbonate  2,400 mg Oral TID WC   sodium bicarbonate  1,300 mg Oral BID   Elmarie Shiley, MD 03/07/2022, 8:46 AM

## 2022-03-07 NOTE — Plan of Care (Signed)
No acute events overnight.    Problem: Education: Goal: Knowledge of General Education information will improve Description: Including pain rating scale, medication(s)/side effects and non-pharmacologic comfort measures Outcome: Progressing   Problem: Health Behavior/Discharge Planning: Goal: Ability to manage health-related needs will improve Outcome: Progressing   Problem: Clinical Measurements: Goal: Ability to maintain clinical measurements within normal limits will improve Outcome: Progressing Goal: Will remain free from infection Outcome: Progressing Goal: Diagnostic test results will improve Outcome: Progressing Goal: Respiratory complications will improve Outcome: Progressing Goal: Cardiovascular complication will be avoided Outcome: Progressing   Problem: Activity: Goal: Risk for activity intolerance will decrease Outcome: Progressing   Problem: Nutrition: Goal: Adequate nutrition will be maintained Outcome: Progressing   Problem: Coping: Goal: Level of anxiety will decrease Outcome: Progressing   Problem: Elimination: Goal: Will not experience complications related to bowel motility Outcome: Progressing Goal: Will not experience complications related to urinary retention Outcome: Progressing   Problem: Pain Managment: Goal: General experience of comfort will improve Outcome: Progressing   Problem: Safety: Goal: Ability to remain free from injury will improve Outcome: Progressing   Problem: Skin Integrity: Goal: Risk for impaired skin integrity will decrease Outcome: Progressing   Problem: Fluid Volume: Goal: Hemodynamic stability will improve Outcome: Progressing   Problem: Clinical Measurements: Goal: Diagnostic test results will improve Outcome: Progressing Goal: Signs and symptoms of infection will decrease Outcome: Progressing   Problem: Respiratory: Goal: Ability to maintain adequate ventilation will improve Outcome: Progressing

## 2022-03-07 NOTE — Plan of Care (Signed)
  Problem: Clinical Measurements: Goal: Ability to maintain clinical measurements within normal limits will improve Outcome: Progressing Goal: Respiratory complications will improve Outcome: Progressing Goal: Cardiovascular complication will be avoided Outcome: Progressing   Problem: Activity: Goal: Risk for activity intolerance will decrease Outcome: Progressing   Problem: Nutrition: Goal: Adequate nutrition will be maintained Outcome: Progressing   Problem: Coping: Goal: Level of anxiety will decrease Outcome: Progressing

## 2022-03-07 NOTE — Progress Notes (Signed)
PT Cancellation Note  Patient Details Name: MIRIANA GAERTNER MRN: [id number] DOB: 1976-01-08   Cancelled Treatment:    Reason Eval/Treat Not Completed: Medical issues which prohibited therapy Pt BP measured at 57/30. Will continue to follow for readiness.   Mackie Pai, SPT Acute Rehabilitation Services  Office: [phone]  Mackie Pai 03/07/2022, 3:05 PM

## 2022-03-07 NOTE — Progress Notes (Signed)
Mobility Specialist Progress Note  Hold: Pt inappropriate for mobility specialist at this time given level of complexity, physical assist, and/or precautions as advised by PT team. Mobility specialist to hold today, will continue to follow for readiness.  Holland Falling Mobility Specialist MS [facility] #:  [phone] Acute Rehab Office:  [phone]

## 2022-03-08 ENCOUNTER — Inpatient Hospital Stay (HOSPITAL_COMMUNITY): Payer: Medicare HMO

## 2022-03-08 ENCOUNTER — Other Ambulatory Visit (HOSPITAL_COMMUNITY): Payer: Self-pay

## 2022-03-08 DIAGNOSIS — L0390 Cellulitis, unspecified: Secondary | ICD-10-CM | POA: Diagnosis not present

## 2022-03-08 DIAGNOSIS — I950 Idiopathic hypotension: Secondary | ICD-10-CM

## 2022-03-08 DIAGNOSIS — K2100 Gastro-esophageal reflux disease with esophagitis, without bleeding: Secondary | ICD-10-CM

## 2022-03-08 DIAGNOSIS — N186 End stage renal disease: Secondary | ICD-10-CM

## 2022-03-08 DIAGNOSIS — L03116 Cellulitis of left lower limb: Secondary | ICD-10-CM

## 2022-03-08 LAB — BASIC METABOLIC PANEL
Anion gap: 19 — ABNORMAL HIGH (ref 5–15)
BUN: 67 mg/dL — ABNORMAL HIGH (ref 6–20)
CO2: 19 mmol/L — ABNORMAL LOW (ref 22–32)
Calcium: 7.8 mg/dL — ABNORMAL LOW (ref 8.9–10.3)
Chloride: 85 mmol/L — ABNORMAL LOW (ref 98–111)
Creatinine, Ser: 7.54 mg/dL — ABNORMAL HIGH (ref 0.44–1.00)
GFR, Estimated: 6 mL/min — ABNORMAL LOW (ref >60)
Glucose, Bld: 108 mg/dL — ABNORMAL HIGH (ref 70–99)
Potassium: 3.3 mmol/L — ABNORMAL LOW (ref 3.5–5.1)
Sodium: 123 mmol/L — ABNORMAL LOW (ref 135–145)

## 2022-03-08 LAB — CBC
HCT: 32.9 % — ABNORMAL LOW (ref 36.0–46.0)
Hemoglobin: 11.5 g/dL — ABNORMAL LOW (ref 12.0–15.0)
MCH: 32.7 pg (ref 26.0–34.0)
MCHC: 35 g/dL (ref 30.0–36.0)
MCV: 93.5 fL (ref 80.0–100.0)
Platelets: 402 10*3/uL — ABNORMAL HIGH (ref 150–400)
RBC: 3.52 MIL/uL — ABNORMAL LOW (ref 3.87–5.11)
RDW: 15.8 % — ABNORMAL HIGH (ref 11.5–15.5)
WBC: 22.9 10*3/uL — ABNORMAL HIGH (ref 4.0–10.5)
nRBC: 0.1 % (ref 0.0–0.2)

## 2022-03-08 MED ORDER — METOCLOPRAMIDE HCL 5 MG/ML IJ SOLN
5.0000 mg | Freq: Four times a day (QID) | INTRAMUSCULAR | Status: DC
Start: 2022-03-08 — End: 2022-03-08
  Administered 2022-03-08: 5 mg via INTRAVENOUS
  Filled 2022-03-08: qty 2

## 2022-03-08 MED ORDER — DELFLEX-LC/2.5% DEXTROSE 394 MOSM/L IP SOLN: INTRAPERITONEAL | Status: DC

## 2022-03-08 MED ORDER — METOCLOPRAMIDE HCL 5 MG/ML IJ SOLN
5.0000 mg | Freq: Four times a day (QID) | INTRAMUSCULAR | Status: DC
  Administered 2022-03-08 – 2022-03-12 (×15): 5 mg via INTRAVENOUS
  Filled 2022-03-08 (×15): qty 2

## 2022-03-08 NOTE — Progress Notes (Signed)
[facility]  PROGRESS NOTE  Lauren Osborn FUX:[id number] DOB: [birth year] DOA: 02/02/2022 PCP: Carrolyn Meiers, MD   Brief HPI:   46 y.o. female with medical history significant of ESRD on peritoneal dialysis, diastolic congestive heart failure, essential hypertension, GERD, liver hemangioma, morbid obesity, thyroid nodule, and more presents to ED with a chief complaint of left leg pain.   Found to have a cellulitis..  Had a fall on 7/1.  BP remains low.    Subjective   Patient seen and examined, complains of blurry vision this morning.  Her blood pressure has been running low.  She is on midodrine, on maximum dose.  Denies weakness of extremities, slurred speech.   Assessment/Plan:     Sepsis due to cellulitis -Presented with tachypnea, tachycardia, leukocytosis, low blood pressure, lactic acid 2.0 -She failed outpatient treatment for cellulitis with cefdinir and doxycycline -Chest x-ray showed no cardiopulmonary disease -X-ray of tibia-fibula shows extensive subcutaneous edema.  No subcutaneous gas identified.  No evidence of osteomyelitis. -ID consulted' started on Zyvox. -Appears to be group A strep cellulitis/impetigo -Plan for 10 days of antibiotics -Wound care consulted  Blurred vision -Likely from persistent hypotension -Patient has been on maximum dose of midodrine 20 mg p.o. 3 times daily -She has no neurological deficit noted on my exam -We will obtain CT head to rule out stroke; though very low likelihood for stroke  Hypotension -Patient has chronic hypotension -Systolic blood pressure has been consistently less than 100 -Continue midodrine 20 mg p.o. 3 times daily -A.m. cortisol 27.5  GERD -Continue Protonix  Hyponatremia -Likely hypervolemic versus poor p.o. intake -Nephrology following  ESRD -Since December 2022 -Initially was on hemodialysis with chest port, left AV fistula in place -Currently patient is on peritoneal dialysis at  home -Nephrology following  Nausea and vomiting -Intractable -Patient is currently on Zofran as needed -We will start Reglan 5 mg IV every 6 hours      Medications     Chlorhexidine Gluconate Cloth  6 each Topical Q0600   gabapentin  100 mg Oral QHS   gentamicin cream  1 Application Topical Daily   heparin  5,000 Units Subcutaneous Q8H   lidocaine  2 patch Transdermal Q24H   linezolid  600 mg Oral Q12H   metoCLOPramide (REGLAN) injection  5 mg Intravenous Q6H   midodrine  20 mg Oral TID WC   nystatin  5 mL Oral TID   pantoprazole  40 mg Oral Daily   sevelamer carbonate  2,400 mg Oral TID WC   sodium bicarbonate  1,300 mg Oral BID     Data Reviewed:   CBG:  No results for input(s): "GLUCAP" in the last 168 hours.  SpO2: 99 % O2 Flow Rate (L/min): 3 L/min    Vitals:   03/07/22 1458 03/07/22 2031 03/08/22 0524 03/08/22 0801  BP: (!) 74/42 98/76 (!) 65/34 (!) 51/32  Pulse:  83 82 85  Resp:  '17 17 17  '$ Temp:  97.8 F (36.6 C) 98 F (36.7 C) 98.1 F (36.7 C)  TempSrc:    Oral  SpO2:  98% 91% 99%  Weight:      Height:          Data Reviewed:  Basic Metabolic Panel: Recent Labs  Lab 03/03/22 0209 03/04/22 0136 03/05/22 0106 03/06/22 0244 03/08/22 0226  NA 125* 124* 126* 125* 123*  K 3.5 3.8 3.3* 3.8 3.3*  CL 92* 90* 91* 88* 85*  CO2 14* 16* 15* 17*  19*  GLUCOSE 105* 90 104* 93 108*  BUN 70* 75* 75* 74* 67*  CREATININE 7.10* 7.38* 7.71* 7.81* 7.54*  CALCIUM 7.5* 7.8* 8.1* 7.8* 7.8*  MG 1.6* 2.0 2.0  --   --   PHOS 8.5* 9.3* 9.9* 9.8*  --     CBC: Recent Labs  Lab 03/02/22 0733 03/03/22 0209 03/05/22 0106 03/08/22 0226  WBC 13.2* 12.6* 15.4* 22.9*  HGB 11.1* 10.8* 11.2* 11.5*  HCT 32.1* 31.7* 32.0* 32.9*  MCV 93.0 95.2 91.7 93.5  PLT 484* 417* 441* 402*    LFT Recent Labs  Lab 03/03/22 0209 03/04/22 0136 03/05/22 0106 03/06/22 0244  ALBUMIN 1.5* 1.6* 1.6* 1.5*     Antibiotics: Anti-infectives (From admission, onward)     Start     Dose/Rate Route Frequency Ordered Stop   03/03/22 0000  linezolid (ZYVOX) 600 MG tablet        600 mg Oral 2 times daily 03/03/22 1317 19-Mar-2022 2359   03/02/22 1315  linezolid (ZYVOX) tablet 600 mg        600 mg Oral Every 12 hours 03/02/22 1218 03/11/22 2359   02/25/2022 1830  cefTRIAXone (ROCEPHIN) 1 g in sodium chloride 0.9 % 100 mL IVPB  Status:  Discontinued        1 g 200 mL/hr over 30 Minutes Intravenous  Once 02/17/2022 1821 02/18/2022 1822   02/05/2022 1830  cefTRIAXone (ROCEPHIN) 2 g in sodium chloride 0.9 % 100 mL IVPB  Status:  Discontinued        2 g 200 mL/hr over 30 Minutes Intravenous Every 24 hours 02/23/2022 1822 03/02/22 1218        DVT prophylaxis: Heparin  Code Status: Full code  Family Communication: No family at bedside   CONSULTS nephrology   Objective    Physical Examination:   General-appears in no acute distress Heart-S1-S2, regular, no murmur auscultated Lungs-clear to auscultation bilaterally, no wheezing or crackles auscultated Abdomen-soft, nontender, no organomegaly Extremities-left lower extremity in dressing Neuro-alert, oriented x3, no focal deficit noted   Status is: Inpatient: Left lower extremity cellulitis           [facility] If 7PM-7AM, please contact night-coverage at [email or web address], Office  [phone]   03/08/2022, 2:39 PM  LOS: 8 days

## 2022-03-08 NOTE — Progress Notes (Signed)
Physical Therapy Treatment Patient Details Name: Lauren Osborn MRN: [id number] DOB: 05-30-1976 Today's Date: 03/08/2022   History of Present Illness Pt is a 46yo F admitted on 6/26 for LLE cellulitis resulting in sepsis. Pt with inpatient mechanical fall on 7/1. PMH: CHF, HTN, GERD, chronic hypotension, peritoneal dialysis    PT Comments    Prior to session, discussed with RN and MD regarding pts low BP over the past few days and were cleared to mobilize patient. BP: supine 53/32(40), seated EOB 61/46 (53), standing 67/53 (59), seated after ambulation 60/47 (53). Pt reporting no increased symptoms during ambulation. Able to ambulate 172f with RW and min guard. PT will continue to follow while inpatient to address deficits in balance, strength, and functional mobility. Recommending HHPT upon discharge to maximize functional independence.    Recommendations for follow up therapy are one component of a multi-disciplinary discharge planning process, led by the attending physician.  Recommendations may be updated based on patient status, additional functional criteria and insurance authorization.  Follow Up Recommendations  Home health PT     Assistance Recommended at Discharge Intermittent Supervision/Assistance  Patient can return home with the following A little help with walking and/or transfers;A little help with bathing/dressing/bathroom;Assistance with cooking/housework;Assist for transportation;Help with stairs or ramp for entrance   Equipment Recommendations  None recommended by PT    Recommendations for Other Services       Precautions / Restrictions Precautions Precautions: Fall Precaution Comments: monitor BP Restrictions Weight Bearing Restrictions: No     Mobility  Bed Mobility Overal bed mobility: Needs Assistance Bed Mobility: Sit to Supine       Sit to supine: Min assist   General bed mobility comments: HHA to get to EOB    Transfers Overall transfer  level: Needs assistance Equipment used: Rolling walker (2 wheels) Transfers: Sit to/from Stand Sit to Stand: Min guard           General transfer comment: min guard for safety    Ambulation/Gait Ambulation/Gait assistance: Min guard Gait Distance (Feet): 150 Feet Assistive device: Rolling walker (2 wheels) Gait Pattern/deviations: Step-through pattern, Decreased stride length, Trunk flexed Gait velocity: decreased Gait velocity interpretation: 1.31 - 2.62 ft/sec, indicative of limited community ambulator   General Gait Details: Cues for upright posture throughout. Slow gait but demonstrating good sequencing   Stairs             Wheelchair Mobility    Modified Rankin (Stroke Patients Only)       Balance Overall balance assessment: Mild deficits observed, not formally tested                                          Cognition Arousal/Alertness: Lethargic Behavior During Therapy: WFL for tasks assessed/performed Overall Cognitive Status: Within Functional Limits for tasks assessed                                          Exercises      General Comments        Pertinent Vitals/Pain Pain Assessment Pain Assessment: Faces Faces Pain Scale: Hurts a little bit Pain Location: Low back and R hip Pain Descriptors / Indicators: Discomfort Pain Intervention(s): Limited activity within patient's tolerance, Monitored during session    Home Living  Prior Function            PT Goals (current goals can now be found in the care plan section) Acute Rehab PT Goals Patient Stated Goal: to return home PT Goal Formulation: With patient Time For Goal Achievement: 03/19/22 Potential to Achieve Goals: Good Progress towards PT goals: Progressing toward goals    Frequency    Min 3X/week      PT Plan Current plan remains appropriate    Co-evaluation              AM-PAC PT "6  Clicks" Mobility   Outcome Measure  Help needed turning from your back to your side while in a flat bed without using bedrails?: None Help needed moving from lying on your back to sitting on the side of a flat bed without using bedrails?: A Little Help needed moving to and from a bed to a chair (including a wheelchair)?: A Little Help needed standing up from a chair using your arms (e.g., wheelchair or bedside chair)?: A Little Help needed to walk in hospital room?: A Little Help needed climbing 3-5 steps with a railing? : A Lot 6 Click Score: 18    End of Session Equipment Utilized During Treatment: Gait belt Activity Tolerance: Patient tolerated treatment well;Patient limited by lethargy;Patient limited by pain Patient left: in chair;with call bell/phone within reach Nurse Communication: Mobility status PT Visit Diagnosis: Unsteadiness on feet (R26.81);Other abnormalities of gait and mobility (R26.89);Pain Pain - Right/Left: Right Pain - part of body: Hip     Time: 8177-1165 PT Time Calculation (min) (ACUTE ONLY): 20 min  Charges:  $Gait Training: 8-22 mins                     Mackie Pai, SPT Acute Rehabilitation Services  Office: [phone]    Mackie Pai 03/08/2022, 1:35 PM

## 2022-03-08 NOTE — Plan of Care (Signed)

## 2022-03-08 NOTE — Progress Notes (Signed)
Patient ID: Lauren Osborn, female   DOB: 01-Apr-1976, 46 y.o.   MRN: [id number] [facility] Progress Note   Assessment/ Plan:   1.  Left leg cellulitis/sepsis: Failed outpatient antibiotic therapy with cefdinir/doxycycline and currently on linezolid with evidence of streptococcal cellulitis/impetigo.  On my assessment appears to be improving clinically and does not have any fever but worsening leukocytosis noted. 2. ESRD: I will continue her peritoneal dialysis on the current prescription with ongoing sodium bicarbonate supplementation and efforts at ultrafiltration/fluid restriction for management of hyponatremia and anasarca.  I have ordered for 1200 cc / 24 hours fluid restriction. 3. Anemia: Hemoglobin and hematocrit currently at goal, will continue to monitor for ESA indications. 4. CKD-MBD: Uptitrated sevelamer 2 days ago because of hyperphosphatemia, will continue to trend this. 5. Nutrition: Continue ongoing nutritional protein supplementation in the setting of infection/PD. 6. Hypotension: Blood pressures remain low and may potentially limit ultrafiltration with peritoneal dialysis-we will increase midodrine to 20 mg 3 times a day.  Subjective:   Denies any chest pain or shortness of breath and is concerned about some visual blurring/unusual sensation when she closes her eyes.  Also is concerned about some right lower extremity redness.   Objective:   BP (!) 51/32 (BP Location: Right Arm)   Pulse 85   Temp 98.1 F (36.7 C) (Oral)   Resp 17   Ht '5\' 3"'$  (1.6 m)   Wt 89.4 kg   SpO2 99%   BMI 34.90 kg/m   Physical Exam: Gen: Appears comfortable resting in bed, being disconnected from PD. CVS: Pulse regular rhythm, normal rate, S1 and S2 normal Resp: Clear to auscultation bilaterally, no rales/rhonchi Abd: Soft, obese, PD catheter in place, nontender Ext: 1+-2+ edema in the lower extremities with left leg in Ace wrap.  Hemangioma lateral aspect of left leg.  Left upper  arm AVF with palpable thrill  Labs: BMET Recent Labs  Lab 03/03/22 0209 03/04/22 0136 03/05/22 0106 03/06/22 0244 03/08/22 0226  NA 125* 124* 126* 125* 123*  K 3.5 3.8 3.3* 3.8 3.3*  CL 92* 90* 91* 88* 85*  CO2 14* 16* 15* 17* 19*  GLUCOSE 105* 90 104* 93 108*  BUN 70* 75* 75* 74* 67*  CREATININE 7.10* 7.38* 7.71* 7.81* 7.54*  CALCIUM 7.5* 7.8* 8.1* 7.8* 7.8*  PHOS 8.5* 9.3* 9.9* 9.8*  --    CBC Recent Labs  Lab 03/02/22 0733 03/03/22 0209 03/05/22 0106 03/08/22 0226  WBC 13.2* 12.6* 15.4* 22.9*  HGB 11.1* 10.8* 11.2* 11.5*  HCT 32.1* 31.7* 32.0* 32.9*  MCV 93.0 95.2 91.7 93.5  PLT 484* 417* 441* 402*     Medications:     Chlorhexidine Gluconate Cloth  6 each Topical Q0600   gabapentin  100 mg Oral QHS   gentamicin cream  1 Application Topical Daily   heparin  5,000 Units Subcutaneous Q8H   lidocaine  2 patch Transdermal Q24H   linezolid  600 mg Oral Q12H   metoCLOPramide (REGLAN) injection  5 mg Intravenous Q6H   midodrine  20 mg Oral TID WC   nystatin  5 mL Oral TID   pantoprazole  40 mg Oral Daily   sevelamer carbonate  2,400 mg Oral TID WC   sodium bicarbonate  1,300 mg Oral BID   Elmarie Shiley, MD 03/08/2022, 9:29 AM

## 2022-03-08 NOTE — Progress Notes (Signed)
Pd tx of 8.35hrs completed. D/c pd using aseptic technique. 2026m uf removed. Pt tolerated tx well. No pt acute distress noted. Bruising noted on lower abd. Dressing changed. Initial drain vol: 4936mTotal uf: 209037motal therapy time: 8hr 59m26m

## 2022-03-08 NOTE — Plan of Care (Signed)

## 2022-03-09 ENCOUNTER — Inpatient Hospital Stay: Payer: Medicaid Other | Admitting: Family

## 2022-03-09 DIAGNOSIS — N186 End stage renal disease: Secondary | ICD-10-CM | POA: Diagnosis not present

## 2022-03-09 DIAGNOSIS — L0390 Cellulitis, unspecified: Secondary | ICD-10-CM | POA: Diagnosis not present

## 2022-03-09 DIAGNOSIS — I950 Idiopathic hypotension: Secondary | ICD-10-CM | POA: Diagnosis not present

## 2022-03-09 DIAGNOSIS — N189 Chronic kidney disease, unspecified: Secondary | ICD-10-CM | POA: Diagnosis not present

## 2022-03-09 LAB — BODY FLUID CELL COUNT WITH DIFFERENTIAL
Eos, Fluid: 0 %
Lymphs, Fluid: 65 %
Monocyte-Macrophage-Serous Fluid: 28 % — ABNORMAL LOW (ref 50–90)
Neutrophil Count, Fluid: 7 % (ref 0–25)
Total Nucleated Cell Count, Fluid: 20 cu mm (ref 0–1000)

## 2022-03-09 LAB — RENAL FUNCTION PANEL
Albumin: <1.5 g/dL — ABNORMAL LOW (ref 3.5–5.0)
Anion gap: 23 — ABNORMAL HIGH (ref 5–15)
BUN: 75 mg/dL — ABNORMAL HIGH (ref 6–20)
CO2: 19 mmol/L — ABNORMAL LOW (ref 22–32)
Calcium: 8.2 mg/dL — ABNORMAL LOW (ref 8.9–10.3)
Chloride: 84 mmol/L — ABNORMAL LOW (ref 98–111)
Creatinine, Ser: 7.95 mg/dL — ABNORMAL HIGH (ref 0.44–1.00)
GFR, Estimated: 6 mL/min — ABNORMAL LOW (ref >60)
Glucose, Bld: 85 mg/dL (ref 70–99)
Phosphorus: 9.8 mg/dL — ABNORMAL HIGH (ref 2.5–4.6)
Potassium: 4.2 mmol/L (ref 3.5–5.1)
Sodium: 126 mmol/L — ABNORMAL LOW (ref 135–145)

## 2022-03-09 LAB — CBC
HCT: 33.7 % — ABNORMAL LOW (ref 36.0–46.0)
Hemoglobin: 11.7 g/dL — ABNORMAL LOW (ref 12.0–15.0)
MCH: 32.4 pg (ref 26.0–34.0)
MCHC: 34.7 g/dL (ref 30.0–36.0)
MCV: 93.4 fL (ref 80.0–100.0)
Platelets: 376 10*3/uL (ref 150–400)
RBC: 3.61 MIL/uL — ABNORMAL LOW (ref 3.87–5.11)
RDW: 15.9 % — ABNORMAL HIGH (ref 11.5–15.5)
WBC: 29 10*3/uL — ABNORMAL HIGH (ref 4.0–10.5)
nRBC: 0.1 % (ref 0.0–0.2)

## 2022-03-09 MED ORDER — DELFLEX-LC/1.5% DEXTROSE 344 MOSM/L IP SOLN: Freq: Once | INTRAPERITONEAL | Status: DC

## 2022-03-09 MED ORDER — DELFLEX-LC/2.5% DEXTROSE 394 MOSM/L IP SOLN: INTRAPERITONEAL | Status: DC

## 2022-03-09 NOTE — Progress Notes (Signed)
Pd tx ended early per pt request due to abd pain/discomfort using aseptic technique. V/s stable. Dressing changed aseptically.  Initial drain vol: 481m Total therapy vol: 75077mTotal UF: 102261motal therapy time: 7hr 81m74m

## 2022-03-09 NOTE — Progress Notes (Signed)
Patient ID: Lauren Osborn, female   DOB: 1976/04/10, 46 y.o.   MRN: [id number] [facility] Progress Note   Assessment/ Plan:   1.  Left leg cellulitis/sepsis: Failed outpatient antibiotic therapy with cefdinir/doxycycline and currently on linezolid with evidence of streptococcal cellulitis/impetigo.  Lower extremity cellulitis improving but she appears to have some lower abdominal wall cellulitis that remains persistent.  Recheck CBC today to follow WBC count. 2. ESRD: I will order for PD fluid cell count to verify that she does not have a superimposed peritonitis given worsening leukocytosis seen on labs yesterday along with new onset abdominal pain.  Resume CCPD prescription tonight with 2.5% dianeal. 3. Anemia: Hemoglobin and hematocrit currently at goal, will continue to monitor for ESA indications. 4. CKD-MBD: Uptitrated sevelamer recently because of hyperphosphatemia, will trend Ca/P. 5. Nutrition: Continue ongoing nutritional protein supplementation in the setting of infection/PD. 6. Hypotension: Blood pressures remain low and may potentially limit ultrafiltration with peritoneal dialysis-on midodrine to 20 mg 3 times a day.  Subjective:   Reports drain pain with her third cycle of peritoneal dialysis prompting early discontinuation of dialysis.   Objective:   BP (!) 64/33 (BP Location: Right Wrist)   Pulse 91   Temp 97.8 F (36.6 C)   Resp 19   Ht '5\' 3"'$  (1.6 m)   Wt 80.8 kg   SpO2 100%   BMI 31.55 kg/m   Physical Exam: Gen: Appears comfortable but fatigued sitting up on the edge of her bed CVS: Pulse regular rhythm, normal rate, S1 and S2 normal Resp: Clear to auscultation bilaterally, no rales/rhonchi Abd: Soft, obese, PD catheter in place, bruising/ecchymosis noted in lower quadrants with peau de orange appearance.  Tenderness elicited on exam. Ext: 1+-2+ edema in the lower extremities with left leg in Ace wrap.  Hemangioma lateral aspect of left leg.  Left  upper arm AVF with palpable thrill  Labs: BMET Recent Labs  Lab 03/03/22 0209 03/04/22 0136 03/05/22 0106 03/06/22 0244 03/08/22 0226  NA 125* 124* 126* 125* 123*  K 3.5 3.8 3.3* 3.8 3.3*  CL 92* 90* 91* 88* 85*  CO2 14* 16* 15* 17* 19*  GLUCOSE 105* 90 104* 93 108*  BUN 70* 75* 75* 74* 67*  CREATININE 7.10* 7.38* 7.71* 7.81* 7.54*  CALCIUM 7.5* 7.8* 8.1* 7.8* 7.8*  PHOS 8.5* 9.3* 9.9* 9.8*  --    CBC Recent Labs  Lab 03/03/22 0209 03/05/22 0106 03/08/22 0226  WBC 12.6* 15.4* 22.9*  HGB 10.8* 11.2* 11.5*  HCT 31.7* 32.0* 32.9*  MCV 95.2 91.7 93.5  PLT 417* 441* 402*     Medications:     Chlorhexidine Gluconate Cloth  6 each Topical Q0600   gabapentin  100 mg Oral QHS   gentamicin cream  1 Application Topical Daily   heparin  5,000 Units Subcutaneous Q8H   lidocaine  2 patch Transdermal Q24H   linezolid  600 mg Oral Q12H   metoCLOPramide (REGLAN) injection  5 mg Intravenous Q6H   midodrine  20 mg Oral TID WC   nystatin  5 mL Oral TID   pantoprazole  40 mg Oral Daily   sevelamer carbonate  2,400 mg Oral TID WC   sodium bicarbonate  1,300 mg Oral BID   Elmarie Shiley, MD 03/09/2022, 8:38 AM

## 2022-03-09 NOTE — Progress Notes (Signed)
[facility]  PROGRESS NOTE  Lauren Osborn EVO:[id number] DOB: 10/10/1975 DOA: 02/07/2022 PCP: Carrolyn Meiers, MD   Brief HPI:   46 y.o. female with medical history significant of ESRD on peritoneal dialysis, diastolic congestive heart failure, essential hypertension, GERD, liver hemangioma, morbid obesity, thyroid nodule, and more presents to ED with a chief complaint of left leg pain.   Found to have a cellulitis..  Had a fall on 7/1.  BP remains low.    Subjective   Patient seen and examined, nausea vomiting improved with Reglan.  Complains of abdominal pain today.  Nephrology checking PD fluid cell count to verify superimposed peritonitis.   Assessment/Plan:     Sepsis due to cellulitis -Presented with tachypnea, tachycardia, leukocytosis, low blood pressure, lactic acid 2.0 -She failed outpatient treatment for cellulitis with cefdinir and doxycycline -Chest x-ray showed no cardiopulmonary disease -X-ray of tibia-fibula shows extensive subcutaneous edema.  No subcutaneous gas identified.  No evidence of osteomyelitis. -ID consulted' started on Zyvox. -Appears to be group A strep cellulitis/impetigo -Plan for 10 days of antibiotics -Wound care  following  Abdominal pain -Unclear etiology -WBC elevated to 29,000 -Patient has right lower quadrant pain, nephrology checking PD fluid cell count to rule out superimposed peritonitis -Follow CBC in a.m.  Blurred vision -Likely from persistent hypotension -Patient has been on maximum dose of midodrine 20 mg p.o. 3 times daily -She has no neurological deficit noted on my exam -CT head negative for stroke  Hypotension -Patient has chronic hypotension -Systolic blood pressure has been consistently less than 100 -Continue midodrine 20 mg p.o. 3 times daily -A.m. cortisol 27.5  GERD -Continue Protonix  Hyponatremia -Likely hypervolemic versus poor p.o. intake -Nephrology following  ESRD -Since December  2022 -Initially was on hemodialysis with chest port, left AV fistula in place -Currently patient is on peritoneal dialysis at home -Nephrology following  Nausea and vomiting -Intractable -Patient is currently on Zofran as needed -Improved with  Reglan 5 mg IV every 6 hours      Medications     Chlorhexidine Gluconate Cloth  6 each Topical Q0600   gabapentin  100 mg Oral QHS   gentamicin cream  1 Application Topical Daily   heparin  5,000 Units Subcutaneous Q8H   lidocaine  2 patch Transdermal Q24H   linezolid  600 mg Oral Q12H   metoCLOPramide (REGLAN) injection  5 mg Intravenous Q6H   midodrine  20 mg Oral TID WC   nystatin  5 mL Oral TID   pantoprazole  40 mg Oral Daily   sevelamer carbonate  2,400 mg Oral TID WC   sodium bicarbonate  1,300 mg Oral BID     Data Reviewed:   CBG:  No results for input(s): "GLUCAP" in the last 168 hours.  SpO2: 100 % O2 Flow Rate (L/min): 3 L/min    Vitals:   03/09/22 1300 03/09/22 1414 03/09/22 1416 03/09/22 1422  BP:  (!) 59/36  (!) 65/38  Pulse: (P) 96 83 (P) 96 85  Resp: (P) 19 18 (P) 19   Temp: (P) 98.7 F (37.1 C) 98.7 F (37.1 C) (P) 98.1 F (36.7 C) 98 F (36.7 C)  TempSrc: (P) Oral  (P) Oral   SpO2:   (P) 98% 100%  Weight:      Height:          Data Reviewed:  Basic Metabolic Panel: Recent Labs  Lab 03/03/22 0209 03/04/22 0136 03/05/22 0106 03/06/22 0244 03/08/22 0226 03/09/22 0945  NA 125* 124* 126* 125* 123* 126*  K 3.5 3.8 3.3* 3.8 3.3* 4.2  CL 92* 90* 91* 88* 85* 84*  CO2 14* 16* 15* 17* 19* 19*  GLUCOSE 105* 90 104* 93 108* 85  BUN 70* 75* 75* 74* 67* 75*  CREATININE 7.10* 7.38* 7.71* 7.81* 7.54* 7.95*  CALCIUM 7.5* 7.8* 8.1* 7.8* 7.8* 8.2*  MG 1.6* 2.0 2.0  --   --   --   PHOS 8.5* 9.3* 9.9* 9.8*  --  9.8*    CBC: Recent Labs  Lab 03/03/22 0209 03/05/22 0106 03/08/22 0226 03/09/22 0945  WBC 12.6* 15.4* 22.9* 29.0*  HGB 10.8* 11.2* 11.5* 11.7*  HCT 31.7* 32.0* 32.9* 33.7*  MCV  95.2 91.7 93.5 93.4  PLT 417* 441* 402* 376    LFT Recent Labs  Lab 03/03/22 0209 03/04/22 0136 03/05/22 0106 03/06/22 0244 03/09/22 0945  ALBUMIN 1.5* 1.6* 1.6* 1.5* <1.5*     Antibiotics: Anti-infectives (From admission, onward)    Start     Dose/Rate Route Frequency Ordered Stop   03/03/22 0000  linezolid (ZYVOX) 600 MG tablet        600 mg Oral 2 times daily 03/03/22 1317 04-05-22 2359   03/02/22 1315  linezolid (ZYVOX) tablet 600 mg        600 mg Oral Every 12 hours 03/02/22 1218 03/11/22 2359   02/15/2022 1830  cefTRIAXone (ROCEPHIN) 1 g in sodium chloride 0.9 % 100 mL IVPB  Status:  Discontinued        1 g 200 mL/hr over 30 Minutes Intravenous  Once 02/20/2022 1821 02/26/2022 1822   02/20/2022 1830  cefTRIAXone (ROCEPHIN) 2 g in sodium chloride 0.9 % 100 mL IVPB  Status:  Discontinued        2 g 200 mL/hr over 30 Minutes Intravenous Every 24 hours 02/22/2022 1822 03/02/22 1218        DVT prophylaxis: Heparin  Code Status: Full code  Family Communication: No family at bedside   CONSULTS nephrology   Objective    Physical Examination:   General-appears in no acute distress Heart-S1-S2, regular, no murmur auscultated Lungs-clear to auscultation bilaterally, no wheezing or crackles auscultated Abdomen-soft, nontender, no organomegaly Extremities-left lower extremity in dressing Neuro-alert, oriented x3, no focal deficit noted   Status is: Inpatient: Left lower extremity cellulitis           [place]   [facility] If 7PM-7AM, please contact night-coverage at [email or web address], Office  [phone]   03/09/2022, 5:38 PM  LOS: 9 days

## 2022-03-09 NOTE — Progress Notes (Signed)
Dr. Joylene Grapes notified due to PD early termination per pts request. No new orders.

## 2022-03-09 NOTE — Plan of Care (Signed)
  Problem: Education: Goal: Knowledge of General Education information will improve Description: Including pain rating scale, medication(s)/side effects and non-pharmacologic comfort measures Outcome: Progressing   Problem: Health Behavior/Discharge Planning: Goal: Ability to manage health-related needs will improve Outcome: Progressing   Problem: Clinical Measurements: Goal: Ability to maintain clinical measurements within normal limits will improve Outcome: Progressing   Problem: Activity: Goal: Risk for activity intolerance will decrease Outcome: Progressing   Problem: Coping: Goal: Level of anxiety will decrease Outcome: Progressing   Problem: Elimination: Goal: Will not experience complications related to bowel motility Outcome: Progressing   Problem: Safety: Goal: Ability to remain free from injury will improve Outcome: Progressing   Problem: Fluid Volume: Goal: Hemodynamic stability will improve Outcome: Progressing

## 2022-03-10 DIAGNOSIS — I950 Idiopathic hypotension: Secondary | ICD-10-CM | POA: Diagnosis not present

## 2022-03-10 DIAGNOSIS — N186 End stage renal disease: Secondary | ICD-10-CM | POA: Diagnosis not present

## 2022-03-10 DIAGNOSIS — L0390 Cellulitis, unspecified: Secondary | ICD-10-CM | POA: Diagnosis not present

## 2022-03-10 DIAGNOSIS — N189 Chronic kidney disease, unspecified: Secondary | ICD-10-CM | POA: Diagnosis not present

## 2022-03-10 MED ORDER — DELFLEX-LC/2.5% DEXTROSE 394 MOSM/L IP SOLN: INTRAPERITONEAL | Status: DC

## 2022-03-10 MED ORDER — DELFLEX-LC/4.25% DEXTROSE 483 MOSM/L IP SOLN: INTRAPERITONEAL | Status: DC

## 2022-03-10 MED ORDER — GENTAMICIN SULFATE 0.1 % EX CREA: 1.0000 | TOPICAL_CREAM | Freq: Every day | CUTANEOUS | Status: DC

## 2022-03-10 NOTE — Progress Notes (Signed)
Initial drained 369m pt requested that's it.   PD treatment initiated via aseptic technique. Consent signed and in chart. Patient is alert and oriented. No complaints of pain. No specimen collected. PD exit site clean, dry and intact. Gentamycin and new dressing applied. Bedside RN educated on PD machine and how to contact tech support when PD machine alarms.

## 2022-03-10 NOTE — Progress Notes (Signed)
PD tx complete tolerated well no c/os Uf 1180 Site wdl Total time 8hr 32 mins

## 2022-03-10 NOTE — Progress Notes (Signed)
  Patient upset about getting PD tx late at night and told this nurse that she wont be getting her treatment tonight, reminded her she needs her tx and I will notify oncall nephrologist if she refuses. She then changed her mind.   PD tx initiated at 2338 via aseptic technique. Used 2.5% and 4.25% dianeal solution alternately. She is alert and oriented. Consent is signed and in chart.

## 2022-03-10 NOTE — Plan of Care (Signed)

## 2022-03-10 NOTE — Progress Notes (Signed)
PT Cancellation Note  Patient Details Name: [name] MRN: [id number] DOB: [birth year]   Cancelled Treatment:    Reason Eval/Treat Not Completed: Medical issues which prohibited therapy. Pt's BP 53/31 currently after just returning to bed from chair x 2 hours. Pt also just received her medication for low BP. Hold for now and will follow up as time allows today vs another date.    Willow Ora, PTA, CLT Acute Rehab Services [phone] 03/10/22, 2:40 PM   Willow Ora 03/10/2022, 2:39 PM

## 2022-03-10 NOTE — Progress Notes (Signed)
[facility]  PROGRESS NOTE  Lauren Osborn VOH:[id number] DOB: 1975-11-05 DOA: 02/20/2022 PCP: Carrolyn Meiers, MD   Brief HPI:   46 y.o. female with medical history significant of ESRD on peritoneal dialysis, diastolic congestive heart failure, essential hypertension, GERD, liver hemangioma, morbid obesity, thyroid nodule, and more presents to ED with a chief complaint of left leg pain.   Found to have a cellulitis..  Had a fall on 7/1.  BP remains low.    Subjective   Patient seen and examined, nausea vomiting has improved with Reglan.  PD fluid cell count was normal.   Assessment/Plan:     Sepsis due to cellulitis -Presented with tachypnea, tachycardia, leukocytosis, low blood pressure, lactic acid 2.0 -She failed outpatient treatment for cellulitis with cefdinir and doxycycline -Chest x-ray showed no cardiopulmonary disease -X-ray of tibia-fibula shows extensive subcutaneous edema.  No subcutaneous gas identified.  No evidence of osteomyelitis. -ID consulted' started on Zyvox. -Appears to be group A strep cellulitis/impetigo -Plan for 10 days of antibiotics -Wound care  following  Abdominal pain -Unclear etiology -WBC elevated to 29,000 -Patient has right lower quadrant pain, PD fluid cell count was normal.  Fluid culture is currently pending. -Follow CBC in a.m.  Blurred vision -Likely from persistent hypotension -Patient has been on maximum dose of midodrine 20 mg p.o. 3 times daily -She has no neurological deficit noted on my exam -CT head negative for stroke  Hypotension -Patient has chronic hypotension -Systolic blood pressure has been consistently less than 100 -Continue midodrine 20 mg p.o. 3 times daily -A.m. cortisol 27.5  GERD -Continue Protonix  Hyponatremia -Likely hypervolemic versus poor p.o. intake -Nephrology following  ESRD -Since December 2022 -Initially was on hemodialysis with chest port, left AV fistula in  place -Currently patient is on peritoneal dialysis at home -Nephrology following  Nausea and vomiting -Intractable -Patient is currently on Zofran as needed -Improved with  Reglan 5 mg IV every 6 hours      Medications     Chlorhexidine Gluconate Cloth  6 each Topical Q0600   gabapentin  100 mg Oral QHS   gentamicin cream  1 Application Topical Daily   heparin  5,000 Units Subcutaneous Q8H   lidocaine  2 patch Transdermal Q24H   linezolid  600 mg Oral Q12H   metoCLOPramide (REGLAN) injection  5 mg Intravenous Q6H   midodrine  20 mg Oral TID WC   nystatin  5 mL Oral TID   pantoprazole  40 mg Oral Daily   sevelamer carbonate  2,400 mg Oral TID WC   sodium bicarbonate  1,300 mg Oral BID     Data Reviewed:   CBG:  No results for input(s): "GLUCAP" in the last 168 hours.  SpO2: 100 % O2 Flow Rate (L/min): 3 L/min    Vitals:   03/09/22 1739 03/09/22 1942 03/10/22 0348 03/10/22 0739  BP: (!) 58/36 (!) 64/34 (!) 51/30 (!) 49/23  Pulse:  85 83 82  Resp:  19 17   Temp:  97.8 F (36.6 C) 97.9 F (36.6 C) 97.6 F (36.4 C)  TempSrc:  Oral Oral Oral  SpO2:  100% 96% 100%  Weight:      Height:          Data Reviewed:  Basic Metabolic Panel: Recent Labs  Lab 03/04/22 0136 03/05/22 0106 03/06/22 0244 03/08/22 0226 03/09/22 0945  NA 124* 126* 125* 123* 126*  K 3.8 3.3* 3.8 3.3* 4.2  CL 90* 91* 88* 85*  84*  CO2 16* 15* 17* 19* 19*  GLUCOSE 90 104* 93 108* 85  BUN 75* 75* 74* 67* 75*  CREATININE 7.38* 7.71* 7.81* 7.54* 7.95*  CALCIUM 7.8* 8.1* 7.8* 7.8* 8.2*  MG 2.0 2.0  --   --   --   PHOS 9.3* 9.9* 9.8*  --  9.8*    CBC: Recent Labs  Lab 03/05/22 0106 03/08/22 0226 03/09/22 0945  WBC 15.4* 22.9* 29.0*  HGB 11.2* 11.5* 11.7*  HCT 32.0* 32.9* 33.7*  MCV 91.7 93.5 93.4  PLT 441* 402* 376    LFT Recent Labs  Lab 03/04/22 0136 03/05/22 0106 03/06/22 0244 03/09/22 0945  ALBUMIN 1.6* 1.6* 1.5* <1.5*     Antibiotics: Anti-infectives (From  admission, onward)    Start     Dose/Rate Route Frequency Ordered Stop   03/03/22 0000  linezolid (ZYVOX) 600 MG tablet        600 mg Oral 2 times daily 03/03/22 1317 03-28-2022 2359   03/02/22 1315  linezolid (ZYVOX) tablet 600 mg        600 mg Oral Every 12 hours 03/02/22 1218 03/11/22 2359   02/04/2022 1830  cefTRIAXone (ROCEPHIN) 1 g in sodium chloride 0.9 % 100 mL IVPB  Status:  Discontinued        1 g 200 mL/hr over 30 Minutes Intravenous  Once 02/08/2022 1821 02/08/2022 1822   02/16/2022 1830  cefTRIAXone (ROCEPHIN) 2 g in sodium chloride 0.9 % 100 mL IVPB  Status:  Discontinued        2 g 200 mL/hr over 30 Minutes Intravenous Every 24 hours 02/08/2022 1822 03/02/22 1218        DVT prophylaxis: Heparin  Code Status: Full code  Family Communication: No family at bedside   CONSULTS nephrology   Objective    Physical Examination:   General-appears in no acute distress Heart-S1-S2, regular, no murmur auscultated Lungs-clear to auscultation bilaterally, no wheezing or crackles auscultated Abdomen-soft, nontender, no organomegaly Extremities-left lower extremity in dressing Neuro-alert, oriented x3, no focal deficit noted   Status is: Inpatient: Left lower extremity cellulitis           [place]   [facility] If 7PM-7AM, please contact night-coverage at [email or web address], Office  [phone]   03/10/2022, 5:03 PM  LOS: 10 days

## 2022-03-10 NOTE — Progress Notes (Signed)
Patient ID: Lauren Osborn, female   DOB: November 11, 1975, 46 y.o.   MRN: [id number] [facility] Progress Note   Assessment/ Plan:   1.  Left leg cellulitis/sepsis: Failed outpatient antibiotic therapy with cefdinir/doxycycline and currently on linezolid with evidence of streptococcal cellulitis/impetigo.  She continues to have erythema in her legs that extends to the mid thigh regions medially but does not feel warm to touch.  Slight worsening of right lower extremity edema overnight.   2. ESRD: No evidence of peritonitis based on PD fluid cell count (cultures pending).  Given her persistent azotemia, I will adjust her PD prescription to include an additional cycle overnight starting today.  Her hypotension will not permit intermittent hemodialysis. 3. Anemia: Hemoglobin and hematocrit currently at goal, will continue to monitor for ESA indications. 4. CKD-MBD: Uptitrated sevelamer recently because of hyperphosphatemia, will trend Ca/P. 5. Nutrition: Continue ongoing nutritional protein supplementation in the setting of infection/PD. 6. Hypotension: Blood pressures remain low and may potentially limit ultrafiltration with peritoneal dialysis-on midodrine to 20 mg 3 times a day.  Subjective:   Continues to have pain in her lower extremities with a sensation of increased right leg swelling.   Objective:   BP (!) 49/23   Pulse 82   Temp 97.6 F (36.4 C) (Oral)   Resp 17   Ht '5\' 3"'$  (1.6 m)   Wt 80.8 kg   SpO2 100%   BMI 31.55 kg/m   Physical Exam: Gen: Appears comfortable but fatigued, eating breakfast CVS: Pulse regular rhythm, normal rate, S1 and S2 normal Resp: Clear to auscultation bilaterally, no rales/rhonchi Abd: Soft, obese, PD catheter in place, bruising/ecchymosis noted in lower quadrants with peau de orange appearance.  Tenderness elicited on exam. Ext: 1+-2+ edema in the lower extremities with left leg in Ace wrap.  Hemangioma lateral aspect of left leg.  Left upper  arm AVF with palpable thrill  Labs: BMET Recent Labs  Lab 03/04/22 0136 03/05/22 0106 03/06/22 0244 03/08/22 0226 03/09/22 0945  NA 124* 126* 125* 123* 126*  K 3.8 3.3* 3.8 3.3* 4.2  CL 90* 91* 88* 85* 84*  CO2 16* 15* 17* 19* 19*  GLUCOSE 90 104* 93 108* 85  BUN 75* 75* 74* 67* 75*  CREATININE 7.38* 7.71* 7.81* 7.54* 7.95*  CALCIUM 7.8* 8.1* 7.8* 7.8* 8.2*  PHOS 9.3* 9.9* 9.8*  --  9.8*   CBC Recent Labs  Lab 03/05/22 0106 03/08/22 0226 03/09/22 0945  WBC 15.4* 22.9* 29.0*  HGB 11.2* 11.5* 11.7*  HCT 32.0* 32.9* 33.7*  MCV 91.7 93.5 93.4  PLT 441* 402* 376     Medications:     Chlorhexidine Gluconate Cloth  6 each Topical Q0600   gabapentin  100 mg Oral QHS   gentamicin cream  1 Application Topical Daily   heparin  5,000 Units Subcutaneous Q8H   lidocaine  2 patch Transdermal Q24H   linezolid  600 mg Oral Q12H   metoCLOPramide (REGLAN) injection  5 mg Intravenous Q6H   midodrine  20 mg Oral TID WC   nystatin  5 mL Oral TID   pantoprazole  40 mg Oral Daily   sevelamer carbonate  2,400 mg Oral TID WC   sodium bicarbonate  1,300 mg Oral BID   Elmarie Shiley, MD 03/10/2022, 9:40 AM

## 2022-03-10 NOTE — Progress Notes (Signed)
Mobility Specialist Progress Note   03/10/22 1503  Mobility  Activity Contraindicated/medical hold   Pt inappropriate for mobility specialist at this time given level of complexity, physical assist, and/or precautions as advised by PT team. Mobility specialist to hold today, will continue to follow for readiness.   Holland Falling Mobility Specialist MS [facility] #:  [phone] Acute Rehab Office:  [phone]

## 2022-03-10 NOTE — Progress Notes (Signed)
RN requested for IV start to obtained labs, educated RN that PIV start does not guarantee the collection of blood, RN  verbalized understanding and will call phlebotomist

## 2022-03-11 ENCOUNTER — Inpatient Hospital Stay (HOSPITAL_COMMUNITY): Payer: Medicare HMO

## 2022-03-11 DIAGNOSIS — I950 Idiopathic hypotension: Secondary | ICD-10-CM | POA: Diagnosis not present

## 2022-03-11 DIAGNOSIS — K2100 Gastro-esophageal reflux disease with esophagitis, without bleeding: Secondary | ICD-10-CM | POA: Diagnosis not present

## 2022-03-11 DIAGNOSIS — L0390 Cellulitis, unspecified: Secondary | ICD-10-CM | POA: Diagnosis not present

## 2022-03-11 DIAGNOSIS — A419 Sepsis, unspecified organism: Secondary | ICD-10-CM | POA: Diagnosis not present

## 2022-03-11 DIAGNOSIS — N186 End stage renal disease: Secondary | ICD-10-CM | POA: Diagnosis not present

## 2022-03-11 LAB — POCT I-STAT 7, (LYTES, BLD GAS, ICA,H+H)
Acid-base deficit: 15 mmol/L — ABNORMAL HIGH (ref 0.0–2.0)
Acid-base deficit: 11 mmol/L — ABNORMAL HIGH (ref 0.0–2.0)
Bicarbonate: 12.6 mmol/L — ABNORMAL LOW (ref 20.0–28.0)
Bicarbonate: 15.3 mmol/L — ABNORMAL LOW (ref 20.0–28.0)
Calcium, Ion: 1.06 mmol/L — ABNORMAL LOW (ref 1.15–1.40)
Calcium, Ion: 1 mmol/L — ABNORMAL LOW (ref 1.15–1.40)
HCT: 33 % — ABNORMAL LOW (ref 36.0–46.0)
HCT: 37 % (ref 36.0–46.0)
Hemoglobin: 11.2 g/dL — ABNORMAL LOW (ref 12.0–15.0)
Hemoglobin: 12.6 g/dL (ref 12.0–15.0)
O2 Saturation: 97 %
O2 Saturation: 99 %
Patient temperature: 91.9
Potassium: 3.9 mmol/L (ref 3.5–5.1)
Potassium: 3.7 mmol/L (ref 3.5–5.1)
Sodium: 119 mmol/L — CL (ref 135–145)
Sodium: 125 mmol/L — ABNORMAL LOW (ref 135–145)
TCO2: 14 mmol/L — ABNORMAL LOW (ref 22–32)
TCO2: 16 mmol/L — ABNORMAL LOW (ref 22–32)
pCO2 arterial: 34.2 mmHg (ref 32–48)
pCO2 arterial: 29.8 mmHg — ABNORMAL LOW (ref 32–48)
pH, Arterial: 7.175 — CL (ref 7.35–7.45)
pH, Arterial: 7.298 — ABNORMAL LOW (ref 7.35–7.45)
pO2, Arterial: 114 mmHg — ABNORMAL HIGH (ref 83–108)
pO2, Arterial: 139 mmHg — ABNORMAL HIGH (ref 83–108)

## 2022-03-11 LAB — RENAL FUNCTION PANEL
Albumin: <1.5 g/dL — ABNORMAL LOW (ref 3.5–5.0)
Anion gap: 31 — ABNORMAL HIGH (ref 5–15)
BUN: 82 mg/dL — ABNORMAL HIGH (ref 6–20)
CO2: 8 mmol/L — ABNORMAL LOW (ref 22–32)
Calcium: 7.8 mg/dL — ABNORMAL LOW (ref 8.9–10.3)
Chloride: 84 mmol/L — ABNORMAL LOW (ref 98–111)
Creatinine, Ser: 7.64 mg/dL — ABNORMAL HIGH (ref 0.44–1.00)
GFR, Estimated: 6 mL/min — ABNORMAL LOW (ref >60)
Glucose, Bld: 89 mg/dL (ref 70–99)
Phosphorus: 10.9 mg/dL — ABNORMAL HIGH (ref 2.5–4.6)
Potassium: 5.2 mmol/L — ABNORMAL HIGH (ref 3.5–5.1)
Sodium: 123 mmol/L — ABNORMAL LOW (ref 135–145)

## 2022-03-11 LAB — COOXEMETRY PANEL
Carboxyhemoglobin: 1.5 % (ref 0.5–1.5)
Methemoglobin: <0.7 % (ref 0.0–1.5)
O2 Saturation: 83.2 %
Total hemoglobin: 10.6 g/dL — ABNORMAL LOW (ref 12.0–16.0)

## 2022-03-11 LAB — COMPREHENSIVE METABOLIC PANEL
ALT: 32 U/L (ref 0–44)
AST: 56 U/L — ABNORMAL HIGH (ref 15–41)
Albumin: 2.4 g/dL — ABNORMAL LOW (ref 3.5–5.0)
Alkaline Phosphatase: 150 U/L — ABNORMAL HIGH (ref 38–126)
Anion gap: 30 — ABNORMAL HIGH (ref 5–15)
BUN: 79 mg/dL — ABNORMAL HIGH (ref 6–20)
CO2: 10 mmol/L — ABNORMAL LOW (ref 22–32)
Calcium: 8.2 mg/dL — ABNORMAL LOW (ref 8.9–10.3)
Chloride: 82 mmol/L — ABNORMAL LOW (ref 98–111)
Creatinine, Ser: 7.58 mg/dL — ABNORMAL HIGH (ref 0.44–1.00)
GFR, Estimated: 6 mL/min — ABNORMAL LOW (ref >60)
Glucose, Bld: 161 mg/dL — ABNORMAL HIGH (ref 70–99)
Potassium: 3.7 mmol/L (ref 3.5–5.1)
Sodium: 122 mmol/L — ABNORMAL LOW (ref 135–145)
Total Bilirubin: 1.2 mg/dL (ref 0.3–1.2)
Total Protein: 4.8 g/dL — ABNORMAL LOW (ref 6.5–8.1)

## 2022-03-11 LAB — GLUCOSE, CAPILLARY
Glucose-Capillary: 164 mg/dL — ABNORMAL HIGH (ref 70–99)
Glucose-Capillary: 47 mg/dL — ABNORMAL LOW (ref 70–99)
Glucose-Capillary: 102 mg/dL — ABNORMAL HIGH (ref 70–99)
Glucose-Capillary: 89 mg/dL (ref 70–99)

## 2022-03-11 LAB — LACTIC ACID, PLASMA
Lactic Acid, Venous: >9 mmol/L (ref 0.5–1.9)
Lactic Acid, Venous: >9 mmol/L (ref 0.5–1.9)
Lactic Acid, Venous: >9 mmol/L (ref 0.5–1.9)
Lactic Acid, Venous: >9 mmol/L (ref 0.5–1.9)

## 2022-03-11 LAB — CBC
HCT: 36.2 % (ref 36.0–46.0)
Hemoglobin: 11.9 g/dL — ABNORMAL LOW (ref 12.0–15.0)
MCH: 32.3 pg (ref 26.0–34.0)
MCHC: 32.9 g/dL (ref 30.0–36.0)
MCV: 98.4 fL (ref 80.0–100.0)
Platelets: 263 10*3/uL (ref 150–400)
RBC: 3.68 MIL/uL — ABNORMAL LOW (ref 3.87–5.11)
RDW: 16.1 % — ABNORMAL HIGH (ref 11.5–15.5)
WBC: 29.8 10*3/uL — ABNORMAL HIGH (ref 4.0–10.5)
nRBC: 0.9 % — ABNORMAL HIGH (ref 0.0–0.2)

## 2022-03-11 LAB — MRSA NEXT GEN BY PCR, NASAL: MRSA by PCR Next Gen: NOT DETECTED

## 2022-03-11 LAB — T4, FREE: Free T4: 0.73 ng/dL (ref 0.61–1.12)

## 2022-03-11 LAB — CK: Total CK: 94 U/L (ref 38–234)

## 2022-03-11 LAB — PATHOLOGIST SMEAR REVIEW

## 2022-03-11 LAB — TSH: TSH: 6.113 u[IU]/mL — ABNORMAL HIGH (ref 0.350–4.500)

## 2022-03-11 MED ORDER — NOREPINEPHRINE 16 MG/250ML-% IV SOLN
0.0000 ug/min | INTRAVENOUS | Status: DC
  Administered 2022-03-11: 10 ug/min via INTRAVENOUS
  Administered 2022-03-11 – 2022-03-12 (×7): 65 ug/min via INTRAVENOUS
  Administered 2022-03-13: 80 ug/min via INTRAVENOUS
  Administered 2022-03-13: 65 ug/min via INTRAVENOUS
  Administered 2022-03-13: 70 ug/min via INTRAVENOUS
  Filled 2022-03-11 (×10): qty 250

## 2022-03-11 MED ORDER — METHYLPREDNISOLONE SODIUM SUCC 125 MG IJ SOLR
80.0000 mg | Freq: Two times a day (BID) | INTRAMUSCULAR | Status: DC
  Administered 2022-03-12: 80 mg via INTRAVENOUS
  Filled 2022-03-11: qty 2

## 2022-03-11 MED ORDER — PRISMASOL BGK 4/2.5 32-4-2.5 MEQ/L REPLACEMENT SOLN
Status: DC
  Filled 2022-03-11 (×3): qty 5000

## 2022-03-11 MED ORDER — SODIUM CHLORIDE 0.9 % IV SOLN
500.0000 [IU]/h | INTRAVENOUS | Status: DC
  Administered 2022-03-11: 500 [IU]/h via INTRAVENOUS_CENTRAL
  Administered 2022-03-12 (×2): 750 [IU]/h via INTRAVENOUS_CENTRAL
  Filled 2022-03-11 (×3): qty 10000

## 2022-03-11 MED ORDER — SODIUM CHLORIDE 0.9 % IV SOLN
0.5000 ug/min | INTRAVENOUS | Status: DC
  Administered 2022-03-11: 20 ug/min via INTRAVENOUS
  Administered 2022-03-12: 12 ug/min via INTRAVENOUS
  Administered 2022-03-12 – 2022-03-13 (×2): 20 ug/min via INTRAVENOUS
  Filled 2022-03-11 (×6): qty 10

## 2022-03-11 MED ORDER — DEXTROSE 50 % IV SOLN
25.0000 g | INTRAVENOUS | Status: AC
  Administered 2022-03-11: 25 g via INTRAVENOUS

## 2022-03-11 MED ORDER — SODIUM CHLORIDE 0.9 % IV SOLN
2.0000 g | Freq: Two times a day (BID) | INTRAVENOUS | Status: DC
  Administered 2022-03-11 – 2022-03-13 (×4): 2 g via INTRAVENOUS
  Filled 2022-03-11 (×4): qty 12.5

## 2022-03-11 MED ORDER — SODIUM BICARBONATE 8.4 % IV SOLN
100.0000 meq | Freq: Once | INTRAVENOUS | Status: AC
  Administered 2022-03-11: 100 meq via INTRAVENOUS
  Filled 2022-03-11 (×2): qty 100

## 2022-03-11 MED ORDER — HEPARIN SODIUM (PORCINE) 1000 UNIT/ML DIALYSIS
1000.0000 [IU] | INTRAMUSCULAR | Status: DC | PRN
  Administered 2022-03-11: 2400 [IU] via INTRAVENOUS_CENTRAL
  Filled 2022-03-11: qty 6
  Filled 2022-03-11 (×2): qty 3

## 2022-03-11 MED ORDER — LINEZOLID 600 MG PO TABS
600.0000 mg | ORAL_TABLET | Freq: Two times a day (BID) | ORAL | Status: DC
  Administered 2022-03-11 – 2022-03-12 (×2): 600 mg via ORAL
  Filled 2022-03-11 (×2): qty 1

## 2022-03-11 MED ORDER — ALBUMIN HUMAN 25 % IV SOLN
50.0000 g | Freq: Once | INTRAVENOUS | Status: AC
  Administered 2022-03-11: 50 g via INTRAVENOUS
  Filled 2022-03-11: qty 200

## 2022-03-11 MED ORDER — EPINEPHRINE HCL 5 MG/250ML IV SOLN IN NS
0.5000 ug/min | INTRAVENOUS | Status: DC
  Administered 2022-03-11: 0.5 ug/min via INTRAVENOUS
  Filled 2022-03-11: qty 250

## 2022-03-11 MED ORDER — KETOROLAC TROMETHAMINE 15 MG/ML IJ SOLN
15.0000 mg | Freq: Once | INTRAMUSCULAR | Status: AC
  Administered 2022-03-11: 15 mg via INTRAVENOUS
  Filled 2022-03-11: qty 1

## 2022-03-11 MED ORDER — DEXTROSE 50 % IV SOLN
INTRAVENOUS | Status: AC
  Filled 2022-03-11: qty 50

## 2022-03-11 MED ORDER — STERILE WATER FOR INJECTION IV SOLN
INTRAVENOUS | Status: DC
  Filled 2022-03-11 (×7): qty 150

## 2022-03-11 MED ORDER — PRISMASOL BGK 4/2.5 32-4-2.5 MEQ/L EC SOLN
Status: DC
  Filled 2022-03-11 (×19): qty 5000

## 2022-03-11 MED ORDER — FENTANYL CITRATE (PF) 100 MCG/2ML IJ SOLN: 12.5000 ug | Freq: Once | INTRAMUSCULAR | Status: DC

## 2022-03-11 MED ORDER — HEPARIN (PORCINE) 2000 UNITS/L FOR CRRT: INTRAVENOUS_CENTRAL | Status: DC | PRN

## 2022-03-11 MED ORDER — PATIROMER SORBITEX CALCIUM 8.4 G PO PACK
16.8000 g | PACK | Freq: Once | ORAL | Status: AC
  Administered 2022-03-11: 16.8 g via ORAL
  Filled 2022-03-11 (×2): qty 2

## 2022-03-11 MED ORDER — DEXTROSE 10 % IV SOLN: INTRAVENOUS | Status: DC

## 2022-03-11 MED ORDER — SODIUM CHLORIDE 0.9 % IV SOLN
250.0000 mL | INTRAVENOUS | Status: DC
  Administered 2022-03-11: 250 mL via INTRAVENOUS

## 2022-03-11 MED ORDER — PHENYLEPHRINE HCL-NACL 20-0.9 MG/250ML-% IV SOLN
INTRAVENOUS | Status: AC
  Administered 2022-03-11: 100 ug/min via INTRAVENOUS
  Filled 2022-03-11: qty 250

## 2022-03-11 MED ORDER — PHENYLEPHRINE CONCENTRATED 100MG/250ML (0.4 MG/ML) INFUSION SIMPLE
0.0000 ug/min | INTRAVENOUS | Status: DC
  Administered 2022-03-11: 400 ug/min via INTRAVENOUS
  Administered 2022-03-12: 300 ug/min via INTRAVENOUS
  Administered 2022-03-12 (×3): 400 ug/min via INTRAVENOUS
  Administered 2022-03-12: 250 ug/min via INTRAVENOUS
  Administered 2022-03-13 (×2): 400 ug/min via INTRAVENOUS
  Filled 2022-03-11 (×9): qty 250

## 2022-03-11 MED ORDER — NOREPINEPHRINE 4 MG/250ML-% IV SOLN: 2.0000 ug/min | INTRAVENOUS | Status: DC

## 2022-03-11 MED ORDER — EPINEPHRINE HCL 5 MG/250ML IV SOLN IN NS
INTRAVENOUS | Status: AC
  Filled 2022-03-11: qty 250

## 2022-03-11 MED ORDER — SODIUM BICARBONATE 8.4 % IV SOLN
INTRAVENOUS | Status: AC
  Filled 2022-03-11: qty 50

## 2022-03-11 MED ORDER — THIAMINE HCL 100 MG/ML IJ SOLN
500.0000 mg | INTRAVENOUS | Status: DC
  Administered 2022-03-11 – 2022-03-12 (×2): 500 mg via INTRAVENOUS
  Filled 2022-03-11 (×3): qty 5

## 2022-03-11 MED ORDER — METHYLPREDNISOLONE SODIUM SUCC 125 MG IJ SOLR
125.0000 mg | Freq: Once | INTRAMUSCULAR | Status: AC
  Administered 2022-03-11: 125 mg via INTRAVENOUS
  Filled 2022-03-11: qty 2

## 2022-03-11 MED ORDER — SODIUM CHLORIDE 0.9 % IV SOLN: INTRAVENOUS | Status: DC | PRN

## 2022-03-11 MED ORDER — VASOPRESSIN 20 UNITS/100 ML INFUSION FOR SHOCK
0.0400 [IU]/min | INTRAVENOUS | Status: DC
  Administered 2022-03-11: 0.03 [IU]/min via INTRAVENOUS
  Administered 2022-03-11: 0.04 [IU]/min via INTRAVENOUS
  Administered 2022-03-12: 0.06 [IU]/min via INTRAVENOUS
  Administered 2022-03-12 – 2022-03-13 (×4): 0.04 [IU]/min via INTRAVENOUS
  Filled 2022-03-11 (×7): qty 100

## 2022-03-11 MED ORDER — SODIUM BICARBONATE 8.4 % IV SOLN
50.0000 meq | Freq: Once | INTRAVENOUS | Status: AC
  Administered 2022-03-11: 50 meq via INTRAVENOUS
  Filled 2022-03-11: qty 50

## 2022-03-11 MED ORDER — ORAL CARE MOUTH RINSE: 15.0000 mL | OROMUCOSAL | Status: DC | PRN

## 2022-03-11 MED ORDER — PHENYLEPHRINE HCL-NACL 20-0.9 MG/250ML-% IV SOLN
0.0000 ug/min | INTRAVENOUS | Status: DC
  Filled 2022-03-11: qty 250

## 2022-03-11 MED ORDER — SODIUM BICARBONATE 8.4 % IV SOLN
100.0000 meq | Freq: Once | INTRAVENOUS | Status: AC
  Administered 2022-03-11: 100 meq via INTRAVENOUS
  Filled 2022-03-11: qty 100

## 2022-03-11 NOTE — Consult Note (Signed)
NAME:  Lauren Osborn, MRN:  [id number], DOB:  08-Jan-1976, LOS: 86 ADMISSION DATE:  02/14/2022, CONSULTATION DATE:  03/11/2022 REFERRING MD:  Dr. Darrick Meigs - [facility], CHIEF COMPLAINT:  Septic shock with need of CRRT    History of Present Illness:  Lauren Osborn is a 46yo female with a PMH significant for ESRD on peritoneal dialysis, OSA, Diastolic congestive heart failure, HTN, Liver hemangioma, and GERD who presented to the ED 02/04/2022  for complaints of left leg pain. She was evaluated and diagnosis with cellulitis on admit. Of note she failed outpatient antibiotic therapy.   Since admission she has been treated with IV antibiotics for management of cellulitis with minimal improvement in pain and eurhythmia. AM of 7/8 patient was evaluated by Nephrology and given persistent erythema with leukocytosis, hypotension, worsening metabolic acidosis, and rising potasium decision was made to consult PCCM and transfer patient to ICU for initiation of CRRT    Pertinent  Medical History  ESRD on peritoneal dialysis, OSA, Diastolic congestive heart failure, HTN, Liver hemangioma, and GERD  Significant Hospital Events: Including procedures, antibiotic start and stop dates in addition to other pertinent events   6/26 admitted for cellulitis  7/8 transferred to ICU for initiation of CRRT   Interim History / Subjective:  As above   Objective   Blood pressure (!) 48/30, pulse 89, temperature 97.8 F (36.6 C), temperature source Oral, resp. rate 18, height '5\' 3"'$  (1.6 m), weight 80.8 kg, SpO2 100 %.        Intake/Output Summary (Last 24 hours) at 03/11/2022 1116 Last data filed at 03/10/2022 2127 Gross per 24 hour  Intake 60 ml  Output --  Net 60 ml   Filed Weights   02/04/2022 1754 03/09/22 0500  Weight: 89.4 kg 80.8 kg    Examination: General: Acute on chronically ill appearing adult female lying in bed in NAD  HEENT: [place]/AT, MM pink/moist, PERRL,  Neuro: Alert and oreinted x3, non-focal  CV: s1s2 regular  rate and rhythm, no murmur, rubs, or gallops,  PULM:  Clear to ascultation, no increased work of breathing. No added breath sounds  GI: soft, bowel sounds active in all 4 quadrants, non-tender, non-distended Extremities: warm/dry, no edema  Skin: no rashes or lesions  Resolved Hospital Problem list     Assessment & Plan:  Sepsis due cellulitis with suspected strep pyogenes process vs staph aureus impetigo.  -Concern for vascular compromise as leading cause to continued hypotension. She is fully alert and oriented and asymptomatic with documented profound hypotension   -ID evaluated and signed off 6/30, recommenced continued Linezolid x10 days  P: Transfer to  ICU Continue IV Linezolid   Follow cultures  IV antibiotics vanc and Zosyn usually Monitor urine output Wound care following  Low threshold to obtain ABD scan  Trend lactic   Hyponatremia  -Sodium have progressive dropped with decrease to 123 7/8, ? Hypervolemic  P: Trend bmet  Obtain urine lytes   ESRD  -Typically dose peritoneal dialysis  P: Nephrology following Plan to place HD cath and start CRRT today  Follow renal function  Monitor urine output Trend Bmet Avoid nephrotoxins Ensure adequate renal perfusion   Abdominal pain with unclear etiology  -PD fluid normal, cultures pending  P: May need to consider images  Pain control   Best Practice (right click and "Reselect all SmartList Selections" daily)   Diet/type: Regular consistency (see orders) DVT prophylaxis: prophylactic heparin  GI prophylaxis: PPI Lines: N/A Foley:  N/A Code  Status:  full code Last date of multidisciplinary goals of care discussion: Update patient daily   Labs   CBC: Recent Labs  Lab 03/05/22 0106 03/08/22 0226 03/09/22 0945 03/11/22 0623  WBC 15.4* 22.9* 29.0* 29.8*  HGB 11.2* 11.5* 11.7* 11.9*  HCT 32.0* 32.9* 33.7* 36.2  MCV 91.7 93.5 93.4 98.4  PLT 441* 402* 376 086    Basic Metabolic Panel: Recent Labs  Lab  03/05/22 0106 03/06/22 0244 03/08/22 0226 03/09/22 0945 03/11/22 0623  NA 126* 125* 123* 126* 123*  K 3.3* 3.8 3.3* 4.2 5.2*  CL 91* 88* 85* 84* 84*  CO2 15* 17* 19* 19* 8*  GLUCOSE 104* 93 108* 85 89  BUN 75* 74* 67* 75* 82*  CREATININE 7.71* 7.81* 7.54* 7.95* 7.64*  CALCIUM 8.1* 7.8* 7.8* 8.2* 7.8*  MG 2.0  --   --   --   --   PHOS 9.9* 9.8*  --  9.8* 10.9*   GFR: Estimated Creatinine Clearance: 9.2 mL/min (A) (by C-G formula based on SCr of 7.64 mg/dL (H)). Recent Labs  Lab 03/05/22 0106 03/08/22 0226 03/09/22 0945 03/11/22 0623  WBC 15.4* 22.9* 29.0* 29.8*    Liver Function Tests: Recent Labs  Lab 03/05/22 0106 03/06/22 0244 03/09/22 0945 03/11/22 0623  ALBUMIN 1.6* 1.5* <1.5* <1.5*   No results for input(s): "LIPASE", "AMYLASE" in the last 168 hours. No results for input(s): "AMMONIA" in the last 168 hours.  ABG    Component Value Date/Time   HCO3 26.2 07/26/2021 1413   HCO3 26.3 07/26/2021 1413   TCO2 24 11/24/2021 0602   O2SAT 40.3 08/15/2021 0527     Coagulation Profile: No results for input(s): "INR", "PROTIME" in the last 168 hours.  Cardiac Enzymes: No results for input(s): "CKTOTAL", "CKMB", "CKMBINDEX", "TROPONINI" in the last 168 hours.  HbA1C: No results found for: "HGBA1C"  CBG: No results for input(s): "GLUCAP" in the last 168 hours.  Review of Systems:   Please see the history of present illness. All other systems reviewed and are negative   Past Medical History:  She,  has a past medical history of Abnormal bilirubin test, Cholelithiasis, Chronic kidney disease, stage 3b ([place]), Diastolic congestive heart failure ([place]), Esophagitis, Essential hypertension, Fibroids, Gastritis, GERD (gastroesophageal reflux disease), Gout, Liver hemangioma, Morbid obesity ([place]), Normocytic anemia, OSA (obstructive sleep apnea), Thyroid nodule, and Tubular adenoma.   Surgical History:   Past Surgical History:  Procedure Laterality Date   AV  FISTULA PLACEMENT Left 08/19/2021   Procedure: LEFT ARM ARTERIOVENOUS (AV) FISTULA;  Surgeon: Serafina Mitchell, MD;  Location: [place];  Service: Vascular;  Laterality: Left;   [place] Left 11/24/2021   Procedure: LEFT SECOND STAGE [place];  Surgeon: Serafina Mitchell, MD;  Location: [place];  Service: Vascular;  Laterality: Left;   BIOPSY  08/03/2020   Procedure: BIOPSY;  Surgeon: Eloise Harman, DO;  [facility];  Service: Endoscopy;;  duodenal gastric   CESAREAN SECTION     COLONOSCOPY WITH PROPOFOL N/A 08/03/2020    Surgeon: Hurshel Keys K, DO; 5 mm tubular adenoma in the sigmoid colon, nonbleeding internal hemorrhoids.  Recommended repeat in 5 years.   ESOPHAGOGASTRODUODENOSCOPY (EGD) WITH PROPOFOL N/A 08/03/2020   Surgeon: Eloise Harman, DO; LA grade C esophagitis without bleeding, gastritis with erythema and shallow ulcerations in gastric antrum biopsied (negative for H. pylori), normal examined duodenum s/p biopsy (benign).   HYSTERECTOMY ABDOMINAL WITH SALPINGECTOMY  2008   IR  FLUORO GUIDE CV LINE RIGHT  08/15/2021   IR US GUIDE VASC ACCESS RIGHT  08/15/2021   POLYPECTOMY  08/03/2020   Procedure: POLYPECTOMY;  Surgeon: Eloise Harman, DO;  [facility];  Service: Endoscopy;;  colon   RIGHT HEART CATH N/A 07/26/2021   Procedure: RIGHT HEART CATH;  Surgeon: Larey Dresser, MD;  Location: [place] [facility];  Service: Cardiovascular;  Laterality: N/A;   THYROIDECTOMY Right 10/21/2018   Procedure: RIGHT HEMITHYROIDECTOMY;  Surgeon: Leta Baptist, MD;  Location: [place];  Service: ENT;  Laterality: Right;     Social History:   reports that she quit smoking about 6 years ago. Her smoking use included cigarettes. She has a 10.00 pack-year smoking history. She has never been exposed to tobacco smoke. She has never used smokeless tobacco. She reports that she does not currently use alcohol. She reports  that she does not use drugs.   Family History:  Her family history includes Breast cancer in her mother; Colon cancer (age of onset: 36) in her father; Colon polyps (age of onset: 42) in her sister; Congestive Heart Failure in her maternal grandfather; Diabetes in her daughter; Diverticulitis in her brother and sister; Other in her maternal grandmother and paternal grandfather.   Allergies Allergies  Allergen Reactions   Oxycodone-Acetaminophen Hives and Other (See Comments)     Home Medications  Prior to Admission medications   Medication Sig Start Date End Date Taking? Authorizing Provider  acetaminophen (TYLENOL) 500 MG tablet Take 1,000 mg by mouth every 6 (six) hours as needed for moderate pain or headache.   Yes [provider]  allopurinol (ZYLOPRIM) 100 MG tablet Take 1 tablet (100 mg total) by mouth 3 (three) times a week. Patient taking differently: Take 100 mg by mouth every Monday, Wednesday, and Friday. 08/26/21  Yes Strader, [place] M, PA-C  doxycycline (VIBRAMYCIN) 100 MG capsule Take 1 capsule (100 mg total) by mouth 2 (two) times daily. 02/21/22  Yes Pollina, Gwenyth Allegra, MD  furosemide (LASIX) 80 MG tablet Take 80 mg by mouth daily. 01/26/22  Yes [provider]  midodrine (PROAMATINE) 5 MG tablet TAKE 3 TABLETS BY MOUTH 3 TIMES A DAY WITH MEALS Patient taking differently: Take 15 mg by mouth 3 (three) times daily with meals. 02/15/22  Yes Satira Sark, MD  pantoprazole (PROTONIX) 40 MG tablet TAKE 1 TABLET BY MOUTH EVERY DAY 01/23/22  Yes Strader, [place] M, PA-C  TART CHERRY PO Take 1 tablet by mouth 3 (three) times a week. Tuesdays, Saturdays & Sundays in the morning.   Yes [provider]  linezolid (ZYVOX) 600 MG tablet Take 1 tablet (600 mg total) by mouth 2 (two) times daily for 10 days. 03/03/22 03/29/2022  Vu, Rockey Situ, MD  traMADol (ULTRAM) 50 MG tablet Take 1 tablet (50 mg total) by mouth every 6 (six) hours as needed. Patient not  taking: Reported on 02/01/2022 11/24/21 11/24/22  [name], PA-C     Critical care time:   [place] D. Kenton Kingfisher, NP-C [facility] Personal contact information can be found on Amion  03/11/2022, 1:56 PM

## 2022-03-11 NOTE — Progress Notes (Signed)
Pharmacy Antibiotic Note  Lauren Osborn is a 46 y.o. female with cellulitis on zyvox and  with transferred to ICU on pressors and to start CRRT .  Pharmacy has been consulted for cefepime dosing. -WBC= 29, afebrile, SCr= 7.6  Plan: -Cefepime 2gm IV q12h -continue Zyvox '600mg'$  q12h -Will follow renal function, cultures and clinical progress   Height: '5\' 3"'$  (160 cm) Weight: 80.8 kg (178 lb 2.1 oz) IBW/kg (Calculated) : 52.4  Temp (24hrs), Avg:96.4 F (35.8 C), Min:92.3 F (33.5 C), Max:97.9 F (36.6 C)  Recent Labs  Lab 03/05/22 0106 03/06/22 0244 03/08/22 0226 03/09/22 0945 03/11/22 0623 03/11/22 1258  WBC 15.4*  --  22.9* 29.0* 29.8*  --   CREATININE 7.71* 7.81* 7.54* 7.95* 7.64*  --   LATICACIDVEN  --   --   --   --   --  >9.0*    Estimated Creatinine Clearance: 9.2 mL/min (A) (by C-G formula based on SCr of 7.64 mg/dL (H)).    Allergies  Allergen Reactions   Oxycodone-Acetaminophen Hives and Other (See Comments)    Antimicrobials this admission: CTX 6/26 >> 6/29 Zyvox 6/29 >>  Dose adjustments this admission:   Microbiology results: 7/6 peritoneal fluid- ngtd  Thank you for allowing pharmacy to be a part of this patient's care.  Hildred Laser, PharmD Clinical Pharmacist **Pharmacist phone directory can now be found on [email or web address] ([email or web address]).  Listed under [place].

## 2022-03-11 NOTE — Procedures (Signed)
Arterial Catheter Insertion Procedure Note  LAURELIN ELSON  [id number]  1975/09/09  Date:03/11/22  Time:4:07 PM    Provider Performing: Omar Person    Procedure: Insertion of Arterial Line [phone]) with US guidance ([id number])   Indication(s) Blood pressure monitoring and/or need for frequent ABGs  Consent Unable to obtain consent due to emergent nature of procedure.  Anesthesia None   Time Out Verified patient identification, verified procedure, site/side was marked, verified correct patient position, special equipment/implants available, medications/allergies/relevant history reviewed, required imaging and test results available.   Sterile Technique Maximal sterile technique including full sterile barrier drape, hand hygiene, sterile gown, sterile gloves, mask, hair covering, sterile ultrasound probe cover (if used).   Procedure Description Area of catheter insertion was cleaned with chlorhexidine and draped in sterile fashion. With real-time ultrasound guidance an arterial catheter was placed into the right radial artery.  Appropriate arterial tracings confirmed on monitor.     Complications/Tolerance None; patient tolerated the procedure well.   EBL Minimal   Specimen(s) None

## 2022-03-11 NOTE — Significant Event (Signed)
Rapid Response Event Note   Reason for Call :  Called about pt needing to transfer to 29M for hypotension.   Initial Focused Assessment:  On arrival, pt sitting up in bed, drowsy but responds to voice. Initial BP 58/30. Unsure how accurate since L arm restricted, lower extremities with swelling and cellulitis, and only access in RUA. BP taken from R wrist. Albumin infusing on arrival.  Interventions:  Retrieved monitor from 29M and connected to pt.  Scheduled Midodrine given  Pt transferred to 29M07    Event Summary:   MD Notified: PTA Call Time: [place] Time: [place] Time: [employer]  Sherilyn Dacosta, RN

## 2022-03-11 NOTE — Progress Notes (Signed)
Patient ID: Lauren Osborn, female   DOB: [birth year], 46 y.o.   MRN: [id number] [facility] Progress Note   Assessment/ Plan:   1.  Left leg cellulitis/sepsis: Failed outpatient antibiotic therapy with cefdinir/doxycycline and currently on linezolid with evidence of streptococcal cellulitis/impetigo.  She continues to have erythema around her legs and thighs but is significantly less and although she remains afebrile, she persists with leukocytosis.  This raises great concern because her hypotension, worsening metabolic acidosis and rising potassium level with limited success with ultrafiltration on peritoneal dialysis indicates that she likely has an additional source of sepsis and/or ischemia.  Would favor transfer to the ICU for pressors and request CCM service to place dialysis catheter for CRRT. 2. ESRD: No evidence of peritonitis based on PD fluid cell count (cultures pending).  Peritoneal dialysis prescription adjusted overnight but labs this morning are concerning for worsening metabolic acidosis and rising potassium along with higher BUN indicating limited efficacy/success of peritoneal dialysis.  With her profound hypotension in spite of ongoing midodrine, discussed with Dr. Darrick Meigs to have her transferred to the ICU for CRRT and evaluation of shock which may be septic versus distributive. 3. Anemia: Hemoglobin and hematocrit currently at goal, will continue to monitor for ESA indications. 4. CKD-MBD: Uptitrated sevelamer recently because of hyperphosphatemia, will trend Ca/P and monitor with CRRT. 5. Nutrition: Continue ongoing nutritional protein supplementation in the setting of infection/PD. 6. Hypotension: Blood pressures profoundly lower over the last 72 hours in spite of uptitrating midodrine.  She needs transfer to the ICU for pressor support and reevaluation of shock.  Subjective:   Rough night after delayed start of peritoneal dialysis yesterday.  Limited ultrafiltration    Objective:   BP (!) 48/30 (BP Location: Right Wrist)   Pulse 89   Temp 97.8 F (36.6 C) (Oral)   Resp 18   Ht '5\' 3"'$  (1.6 m)   Wt 80.8 kg   SpO2 98%   BMI 31.55 kg/m   Physical Exam: Gen: Appears somnolent resting in bed, awakens to conversation and remains oriented 3 CVS: Pulse regular rhythm, normal rate, S1 and S2 normal Resp: Clear to auscultation bilaterally, no rales/rhonchi Abd: Soft, obese, PD catheter in place, bruising/ecchymosis noted in lower quadrants with peau de orange appearance.  Tenderness elicited on exam. Ext: 1+-2+ edema in the lower extremities with left leg in Ace wrap.  Hemangioma lateral aspect of left leg.  Left upper arm AVF thrombosed  Labs: BMET Recent Labs  Lab 03/05/22 0106 03/06/22 0244 03/08/22 0226 03/09/22 0945 03/11/22 0623  NA 126* 125* 123* 126* 123*  K 3.3* 3.8 3.3* 4.2 5.2*  CL 91* 88* 85* 84* 84*  CO2 15* 17* 19* 19* 8*  GLUCOSE 104* 93 108* 85 89  BUN 75* 74* 67* 75* 82*  CREATININE 7.71* 7.81* 7.54* 7.95* 7.64*  CALCIUM 8.1* 7.8* 7.8* 8.2* 7.8*  PHOS 9.9* 9.8*  --  9.8* 10.9*   CBC Recent Labs  Lab 03/05/22 0106 03/08/22 0226 03/09/22 0945 03/11/22 0623  WBC 15.4* 22.9* 29.0* 29.8*  HGB 11.2* 11.5* 11.7* 11.9*  HCT 32.0* 32.9* 33.7* 36.2  MCV 91.7 93.5 93.4 98.4  PLT 441* 402* 376 263     Medications:     Chlorhexidine Gluconate Cloth  6 each Topical Q0600   gabapentin  100 mg Oral QHS   gentamicin cream  1 Application Topical Daily   heparin  5,000 Units Subcutaneous Q8H   lidocaine  2 patch Transdermal Q24H  linezolid  600 mg Oral Q12H   metoCLOPramide (REGLAN) injection  5 mg Intravenous Q6H   midodrine  20 mg Oral TID WC   nystatin  5 mL Oral TID   pantoprazole  40 mg Oral Daily   patiromer  16.8 g Oral Once   sevelamer carbonate  2,400 mg Oral TID WC   sodium bicarbonate  1,300 mg Oral BID   Elmarie Shiley, MD 03/11/2022, 10:29 AM

## 2022-03-11 NOTE — Progress Notes (Signed)
Dr. Mable Paris., NP notified of critical lactic acid >9 and CBG of 47.

## 2022-03-11 NOTE — Procedures (Signed)
Central Venous Catheter Insertion Procedure Note  Lauren Osborn  [id number]  November 22, 1975  Date:03/11/22  Time:4:06 PM   Provider Performing:Yeshaya Vath Bernell List   Procedure: Insertion of Non-tunneled Central Venous Catheter(36556)with US guidance (89791)    Indication(s) Hemodialysis  Consent Risks of the procedure as well as the alternatives and risks of each were explained to the patient and/or caregiver.  Consent for the procedure was obtained and is signed in the bedside chart  Anesthesia Topical only with 1% lidocaine   Timeout Verified patient identification, verified procedure, site/side was marked, verified correct patient position, special equipment/implants available, medications/allergies/relevant history reviewed, required imaging and test results available.  Sterile Technique Maximal sterile technique including full sterile barrier drape, hand hygiene, sterile gown, sterile gloves, mask, hair covering, sterile ultrasound probe cover (if used).  Procedure Description Area of catheter insertion was cleaned with chlorhexidine and draped in sterile fashion.   With real-time ultrasound guidance a HD catheter was placed into the right internal jugular vein.  Nonpulsatile blood flow and easy flushing noted in all ports.  The catheter was sutured in place and sterile dressing applied.  Complications/Tolerance None; patient tolerated the procedure well. Chest X-ray is ordered to verify placement for internal jugular or subclavian cannulation.  Chest x-ray is not ordered for femoral cannulation.  EBL Minimal  Specimen(s) None

## 2022-03-11 NOTE — Plan of Care (Signed)
  Problem: Clinical Measurements: Goal: Ability to maintain clinical measurements within normal limits will improve Outcome: Progressing   Problem: Clinical Measurements: Goal: Will remain free from infection Outcome: Progressing   Problem: Clinical Measurements: Goal: Diagnostic test results will improve Outcome: Progressing   Problem: Pain Managment: Goal: General experience of comfort will improve Outcome: Progressing   Problem: Safety: Goal: Ability to remain free from injury will improve Outcome: Progressing   Problem: Fluid Volume: Goal: Hemodynamic stability will improve Outcome: Progressing

## 2022-03-11 NOTE — Progress Notes (Signed)
[facility]  PROGRESS NOTE  Lauren Osborn EYC:[id number] DOB: Nov 06, 1975 DOA: 02/08/2022 PCP: Lauren Meiers, MD   Brief HPI:   46 y.o. female with medical history significant of ESRD on peritoneal dialysis, diastolic congestive heart failure, essential hypertension, GERD, liver hemangioma, morbid obesity, thyroid nodule, and more presents to ED with a chief complaint of left leg pain.   Found to have a cellulitis..  Had a fall on 7/1.  BP remains low.    Subjective   Patient seen, appears lethargic today.  Labs this morning showed severe metabolic acidosis with worsening BUN/creatinine.  Patient is already on peritoneal dialysis.   Assessment/Plan:     Sepsis due to cellulitis/?  Septic shock -Presented with tachypnea, tachycardia, leukocytosis, low blood pressure, lactic acid 2.0 -She failed outpatient treatment for cellulitis with cefdinir and doxycycline -Chest x-ray showed no cardiopulmonary disease -X-ray of tibia-fibula shows extensive subcutaneous edema.  No subcutaneous gas identified.  No evidence of osteomyelitis. -ID consulted' started on Zyvox. -Appears to be group A strep cellulitis/impetigo -Plan for 10 days of antibiotics  Septic shock -Patient blood pressure SBP is in 50s to 14H -Severe metabolic acidosis with bicarb 8 -She is already on peritoneal dialysis -She will need to be transferred to ICU with pressor support and CRRT per PCCM -Discussed with nephrology, -We will consult PCCM  Abdominal pain -Unclear etiology -WBC elevated to 29,000 -Patient has right lower quadrant pain, PD fluid cell count was normal.  Fluid culture is currently pending.   Blurred vision -Likely from persistent hypotension -Patient has been on maximum dose of midodrine 20 mg p.o. 3 times daily -She has no neurological deficit noted on my exam -CT head negative for stroke  Hypotension -Patient has chronic hypotension -Systolic blood pressure has been  consistently less than 100 -Continue midodrine 20 mg p.o. 3 times daily -A.m. cortisol 27.5  GERD -Continue Protonix  Hyponatremia -Likely hypervolemic versus poor p.o. intake -Nephrology following  ESRD -Since December 2022 -Initially was on hemodialysis with chest port, left AV fistula in place -Currently patient is on peritoneal dialysis at home -Nephrology following  Nausea and vomiting -Intractable -Patient is currently on Zofran as needed -Improved with  Reglan 5 mg IV every 6 hours      Medications     Chlorhexidine Gluconate Cloth  6 each Topical Q0600   gabapentin  100 mg Oral QHS   gentamicin cream  1 Application Topical Daily   heparin  5,000 Units Subcutaneous Q8H   lidocaine  2 patch Transdermal Q24H   linezolid  600 mg Oral Q12H   metoCLOPramide (REGLAN) injection  5 mg Intravenous Q6H   midodrine  20 mg Oral TID WC   nystatin  5 mL Oral TID   pantoprazole  40 mg Oral Daily   sevelamer carbonate  2,400 mg Oral TID WC   sodium bicarbonate  1,300 mg Oral BID     Data Reviewed:   CBG:  No results for input(s): "GLUCAP" in the last 168 hours.  SpO2: 93 % O2 Flow Rate (L/min): 3 L/min    Vitals:   03/11/22 1122 03/11/22 1241 03/11/22 1245 03/11/22 1315  BP: (!) 71/55 (!) 71/55 (!) 55/37 (!) 54/29  Pulse: 86 87 85 92  Resp: 12 (!) '21 14 12  '$ Temp: 97.9 F (36.6 C)     TempSrc:      SpO2:  94% 95% 93%  Weight:      Height:  Data Reviewed:  Basic Metabolic Panel: Recent Labs  Lab 03/05/22 0106 03/06/22 0244 03/08/22 0226 03/09/22 0945 03/11/22 0623  NA 126* 125* 123* 126* 123*  K 3.3* 3.8 3.3* 4.2 5.2*  CL 91* 88* 85* 84* 84*  CO2 15* 17* 19* 19* 8*  GLUCOSE 104* 93 108* 85 89  BUN 75* 74* 67* 75* 82*  CREATININE 7.71* 7.81* 7.54* 7.95* 7.64*  CALCIUM 8.1* 7.8* 7.8* 8.2* 7.8*  MG 2.0  --   --   --   --   PHOS 9.9* 9.8*  --  9.8* 10.9*    CBC: Recent Labs  Lab 03/05/22 0106 03/08/22 0226 03/09/22 0945  03/11/22 0623  WBC 15.4* 22.9* 29.0* 29.8*  HGB 11.2* 11.5* 11.7* 11.9*  HCT 32.0* 32.9* 33.7* 36.2  MCV 91.7 93.5 93.4 98.4  PLT 441* 402* 376 263    LFT Recent Labs  Lab 03/05/22 0106 03/06/22 0244 03/09/22 0945 03/11/22 0623  ALBUMIN 1.6* 1.5* <1.5* <1.5*     Antibiotics: Anti-infectives (From admission, onward)    Start     Dose/Rate Route Frequency Ordered Stop   03/03/22 0000  linezolid (ZYVOX) 600 MG tablet        600 mg Oral 2 times daily 03/03/22 1317 04-11-2022 2359   03/02/22 1315  linezolid (ZYVOX) tablet 600 mg        600 mg Oral Every 12 hours 03/02/22 1218 03/11/22 2359   02/07/2022 1830  cefTRIAXone (ROCEPHIN) 1 g in sodium chloride 0.9 % 100 mL IVPB  Status:  Discontinued        1 g 200 mL/hr over 30 Minutes Intravenous  Once 02/12/2022 1821 02/10/2022 1822   02/05/2022 1830  cefTRIAXone (ROCEPHIN) 2 g in sodium chloride 0.9 % 100 mL IVPB  Status:  Discontinued        2 g 200 mL/hr over 30 Minutes Intravenous Every 24 hours 02/10/2022 1822 03/02/22 1218        DVT prophylaxis: Heparin  Code Status: Full code  Family Communication: No family at bedside   CONSULTS nephrology   Objective    Physical Examination:   General-appears in no acute distress Heart-S1-S2, regular, no murmur auscultated Lungs-clear to auscultation bilaterally, no wheezing or crackles auscultated Abdomen-soft, nontender, no organomegaly Extremities-left lower extremity in dressing, 2+ edema in the lower extremities Neuro-alert, oriented x3, no focal deficit noted   Status is: Inpatient: Left lower extremity cellulitis           [name]   [facility] If 7PM-7AM, please contact night-coverage at [email or web address], Office  [phone]   03/11/2022, 1:35 PM  LOS: 11 days

## 2022-03-11 NOTE — Progress Notes (Addendum)
eLink Physician-Brief Progress Note Patient Name: Lauren Osborn DOB: 22-Dec-1975 MRN: [id number]   Date of Service  03/11/2022  HPI/Events of Note  Has been started on CRRT. Asking for repeat LA since it was reading >9. See if it has come down some since starting.   RN is unable to continue CRRT as perscribed. Concerned that the a line may not be reading correctly due to her vasculature and stenosis. Wondering if we can get an A line to femoral instead.   Need levo gtt max limit increased. Its at 65.   Epi 12. Vaso 0.03. BP 79/48 (60) Add neo or giaprezza if we think BP is correct so we can do CRRT therapy as perscribed.   eICU Interventions  vaso increased 0.04, increased max levo to 65, added neo. Repeat lactic acid pending. Patient mentating appropriately at this time  Femoral aline not ideal given LE cellulitis  Will inform bedside CCM team Discussed with bedside RN     Intervention Category Major Interventions: Hypotension - evaluation and management  Judd Lien 03/11/2022, 10:07 PM

## 2022-03-11 NOTE — Progress Notes (Signed)
Dr. Valeta Harms, Dr. Posey Pronto, Lauren Leitz, NP aware of BP measurements. Unsure of accuracy of BP cuff readings with limitations on sites with L arm and leg restriction. Plan for potential arterial line placement when MD comes to place HD cath.

## 2022-03-12 ENCOUNTER — Other Ambulatory Visit (HOSPITAL_COMMUNITY): Payer: Medicaid Other

## 2022-03-12 ENCOUNTER — Inpatient Hospital Stay (HOSPITAL_COMMUNITY): Payer: Medicare HMO

## 2022-03-12 ENCOUNTER — Inpatient Hospital Stay: Payer: Self-pay

## 2022-03-12 DIAGNOSIS — R0609 Other forms of dyspnea: Secondary | ICD-10-CM | POA: Diagnosis not present

## 2022-03-12 DIAGNOSIS — N186 End stage renal disease: Secondary | ICD-10-CM | POA: Diagnosis not present

## 2022-03-12 DIAGNOSIS — L0390 Cellulitis, unspecified: Secondary | ICD-10-CM | POA: Diagnosis not present

## 2022-03-12 DIAGNOSIS — E872 Acidosis, unspecified: Secondary | ICD-10-CM | POA: Diagnosis not present

## 2022-03-12 DIAGNOSIS — R21 Rash and other nonspecific skin eruption: Secondary | ICD-10-CM | POA: Diagnosis not present

## 2022-03-12 DIAGNOSIS — A419 Sepsis, unspecified organism: Secondary | ICD-10-CM | POA: Diagnosis not present

## 2022-03-12 DIAGNOSIS — N189 Chronic kidney disease, unspecified: Secondary | ICD-10-CM

## 2022-03-12 DIAGNOSIS — D692 Other nonthrombocytopenic purpura: Secondary | ICD-10-CM

## 2022-03-12 DIAGNOSIS — L03116 Cellulitis of left lower limb: Secondary | ICD-10-CM | POA: Diagnosis not present

## 2022-03-12 LAB — POCT I-STAT 7, (LYTES, BLD GAS, ICA,H+H)
Acid-base deficit: 15 mmol/L — ABNORMAL HIGH (ref 0.0–2.0)
Acid-base deficit: 15 mmol/L — ABNORMAL HIGH (ref 0.0–2.0)
Acid-base deficit: 14 mmol/L — ABNORMAL HIGH (ref 0.0–2.0)
Acid-base deficit: 9 mmol/L — ABNORMAL HIGH (ref 0.0–2.0)
Bicarbonate: 12.8 mmol/L — ABNORMAL LOW (ref 20.0–28.0)
Bicarbonate: 12.6 mmol/L — ABNORMAL LOW (ref 20.0–28.0)
Bicarbonate: 13.7 mmol/L — ABNORMAL LOW (ref 20.0–28.0)
Bicarbonate: 17.8 mmol/L — ABNORMAL LOW (ref 20.0–28.0)
Calcium, Ion: 1.02 mmol/L — ABNORMAL LOW (ref 1.15–1.40)
Calcium, Ion: 0.97 mmol/L — ABNORMAL LOW (ref 1.15–1.40)
Calcium, Ion: 0.9 mmol/L — ABNORMAL LOW (ref 1.15–1.40)
Calcium, Ion: 0.84 mmol/L — CL (ref 1.15–1.40)
HCT: 35 % — ABNORMAL LOW (ref 36.0–46.0)
HCT: 36 % (ref 36.0–46.0)
HCT: 32 % — ABNORMAL LOW (ref 36.0–46.0)
HCT: 33 % — ABNORMAL LOW (ref 36.0–46.0)
Hemoglobin: 11.9 g/dL — ABNORMAL LOW (ref 12.0–15.0)
Hemoglobin: 12.2 g/dL (ref 12.0–15.0)
Hemoglobin: 10.9 g/dL — ABNORMAL LOW (ref 12.0–15.0)
Hemoglobin: 11.2 g/dL — ABNORMAL LOW (ref 12.0–15.0)
O2 Saturation: 98 %
O2 Saturation: 98 %
O2 Saturation: 98 %
O2 Saturation: 97 %
Patient temperature: 90
Patient temperature: 31.5
Potassium: 4.1 mmol/L (ref 3.5–5.1)
Potassium: 4.8 mmol/L (ref 3.5–5.1)
Potassium: 4.1 mmol/L (ref 3.5–5.1)
Potassium: 4.2 mmol/L (ref 3.5–5.1)
Sodium: 127 mmol/L — ABNORMAL LOW (ref 135–145)
Sodium: 125 mmol/L — ABNORMAL LOW (ref 135–145)
Sodium: 123 mmol/L — ABNORMAL LOW (ref 135–145)
Sodium: 126 mmol/L — ABNORMAL LOW (ref 135–145)
TCO2: 14 mmol/L — ABNORMAL LOW (ref 22–32)
TCO2: 14 mmol/L — ABNORMAL LOW (ref 22–32)
TCO2: 15 mmol/L — ABNORMAL LOW (ref 22–32)
TCO2: 19 mmol/L — ABNORMAL LOW (ref 22–32)
pCO2 arterial: 27.9 mmHg — ABNORMAL LOW (ref 32–48)
pCO2 arterial: 35 mmHg (ref 32–48)
pCO2 arterial: 37.2 mmHg (ref 32–48)
pCO2 arterial: 33.2 mmHg (ref 32–48)
pH, Arterial: 7.241 — ABNORMAL LOW (ref 7.35–7.45)
pH, Arterial: 7.163 — CL (ref 7.35–7.45)
pH, Arterial: 7.173 — CL (ref 7.35–7.45)
pH, Arterial: 7.307 — ABNORMAL LOW (ref 7.35–7.45)
pO2, Arterial: 103 mmHg (ref 83–108)
pO2, Arterial: 127 mmHg — ABNORMAL HIGH (ref 83–108)
pO2, Arterial: 138 mmHg — ABNORMAL HIGH (ref 83–108)
pO2, Arterial: 77 mmHg — ABNORMAL LOW (ref 83–108)

## 2022-03-12 LAB — ECHOCARDIOGRAM COMPLETE
Area-P 1/2: 3.42 cm2
Height: 63 in
S' Lateral: 2.4 cm
Weight: 2920.65 oz

## 2022-03-12 LAB — CBC
HCT: 30.1 % — ABNORMAL LOW (ref 36.0–46.0)
HCT: 29.2 % — ABNORMAL LOW (ref 36.0–46.0)
Hemoglobin: 11 g/dL — ABNORMAL LOW (ref 12.0–15.0)
Hemoglobin: 10.2 g/dL — ABNORMAL LOW (ref 12.0–15.0)
MCH: 33.3 pg (ref 26.0–34.0)
MCH: 32.8 pg (ref 26.0–34.0)
MCHC: 36.5 g/dL — ABNORMAL HIGH (ref 30.0–36.0)
MCHC: 34.9 g/dL (ref 30.0–36.0)
MCV: 91.2 fL (ref 80.0–100.0)
MCV: 93.9 fL (ref 80.0–100.0)
Platelets: 148 10*3/uL — ABNORMAL LOW (ref 150–400)
Platelets: 102 10*3/uL — ABNORMAL LOW (ref 150–400)
RBC: 3.3 MIL/uL — ABNORMAL LOW (ref 3.87–5.11)
RBC: 3.11 MIL/uL — ABNORMAL LOW (ref 3.87–5.11)
RDW: 15.6 % — ABNORMAL HIGH (ref 11.5–15.5)
RDW: 15.9 % — ABNORMAL HIGH (ref 11.5–15.5)
WBC: 26.7 10*3/uL — ABNORMAL HIGH (ref 4.0–10.5)
WBC: 25.3 10*3/uL — ABNORMAL HIGH (ref 4.0–10.5)
nRBC: 1.9 % — ABNORMAL HIGH (ref 0.0–0.2)
nRBC: 2.6 % — ABNORMAL HIGH (ref 0.0–0.2)

## 2022-03-12 LAB — RENAL FUNCTION PANEL
Albumin: 2.2 g/dL — ABNORMAL LOW (ref 3.5–5.0)
Albumin: 1.8 g/dL — ABNORMAL LOW (ref 3.5–5.0)
Anion gap: 29 — ABNORMAL HIGH (ref 5–15)
Anion gap: 33 — ABNORMAL HIGH (ref 5–15)
BUN: 49 mg/dL — ABNORMAL HIGH (ref 6–20)
BUN: 32 mg/dL — ABNORMAL HIGH (ref 6–20)
CO2: 13 mmol/L — ABNORMAL LOW (ref 22–32)
CO2: 14 mmol/L — ABNORMAL LOW (ref 22–32)
Calcium: 8.2 mg/dL — ABNORMAL LOW (ref 8.9–10.3)
Calcium: 7.2 mg/dL — ABNORMAL LOW (ref 8.9–10.3)
Chloride: 89 mmol/L — ABNORMAL LOW (ref 98–111)
Chloride: 83 mmol/L — ABNORMAL LOW (ref 98–111)
Creatinine, Ser: 4.31 mg/dL — ABNORMAL HIGH (ref 0.44–1.00)
Creatinine, Ser: 2.63 mg/dL — ABNORMAL HIGH (ref 0.44–1.00)
GFR, Estimated: 12 mL/min — ABNORMAL LOW (ref >60)
GFR, Estimated: 22 mL/min — ABNORMAL LOW (ref >60)
Glucose, Bld: 66 mg/dL — ABNORMAL LOW (ref 70–99)
Glucose, Bld: 64 mg/dL — ABNORMAL LOW (ref 70–99)
Phosphorus: 7.2 mg/dL — ABNORMAL HIGH (ref 2.5–4.6)
Phosphorus: 3.4 mg/dL (ref 2.5–4.6)
Potassium: 4 mmol/L (ref 3.5–5.1)
Potassium: 5.2 mmol/L — ABNORMAL HIGH (ref 3.5–5.1)
Sodium: 131 mmol/L — ABNORMAL LOW (ref 135–145)
Sodium: 130 mmol/L — ABNORMAL LOW (ref 135–145)

## 2022-03-12 LAB — LACTIC ACID, PLASMA
Lactic Acid, Venous: >9 mmol/L (ref 0.5–1.9)
Lactic Acid, Venous: >9 mmol/L (ref 0.5–1.9)

## 2022-03-12 LAB — APTT: aPTT: 91 seconds — ABNORMAL HIGH (ref 24–36)

## 2022-03-12 LAB — GLUCOSE, CAPILLARY
Glucose-Capillary: 74 mg/dL (ref 70–99)
Glucose-Capillary: 63 mg/dL — ABNORMAL LOW (ref 70–99)
Glucose-Capillary: 67 mg/dL — ABNORMAL LOW (ref 70–99)
Glucose-Capillary: 118 mg/dL — ABNORMAL HIGH (ref 70–99)
Glucose-Capillary: 100 mg/dL — ABNORMAL HIGH (ref 70–99)
Glucose-Capillary: 37 mg/dL — CL (ref 70–99)
Glucose-Capillary: 182 mg/dL — ABNORMAL HIGH (ref 70–99)
Glucose-Capillary: 69 mg/dL — ABNORMAL LOW (ref 70–99)
Glucose-Capillary: 125 mg/dL — ABNORMAL HIGH (ref 70–99)
Glucose-Capillary: 49 mg/dL — ABNORMAL LOW (ref 70–99)
Glucose-Capillary: 213 mg/dL — ABNORMAL HIGH (ref 70–99)
Glucose-Capillary: 97 mg/dL (ref 70–99)
Glucose-Capillary: 81 mg/dL (ref 70–99)

## 2022-03-12 LAB — BODY FLUID CULTURE W GRAM STAIN: Culture: NO GROWTH

## 2022-03-12 LAB — MAGNESIUM: Magnesium: 2.1 mg/dL (ref 1.7–2.4)

## 2022-03-12 LAB — DIC (DISSEMINATED INTRAVASCULAR COAGULATION)PANEL
D-Dimer, Quant: 9.29 ug/mL-FEU — ABNORMAL HIGH (ref 0.00–0.50)
Fibrinogen: 201 mg/dL — ABNORMAL LOW (ref 210–475)
INR: 2.7 — ABNORMAL HIGH (ref 0.8–1.2)
Platelets: 100 10*3/uL — ABNORMAL LOW (ref 150–400)
Prothrombin Time: 28.8 seconds — ABNORMAL HIGH (ref 11.4–15.2)
Smear Review: NONE SEEN
aPTT: >200 seconds (ref 24–36)

## 2022-03-12 MED ORDER — SODIUM CHLORIDE 0.9% FLUSH
10.0000 mL | Freq: Two times a day (BID) | INTRAVENOUS | Status: DC
  Administered 2022-03-13: 10 mL

## 2022-03-12 MED ORDER — DEXTROSE 20 % IV SOLN
INTRAVENOUS | Status: DC
  Filled 2022-03-12 (×10): qty 500

## 2022-03-12 MED ORDER — STERILE WATER FOR INJECTION IJ SOLN
INTRAMUSCULAR | Status: AC
  Administered 2022-03-12: 10 mL
  Filled 2022-03-12: qty 10

## 2022-03-12 MED ORDER — SODIUM BICARBONATE 8.4 % IV SOLN
INTRAVENOUS | Status: AC
  Filled 2022-03-12: qty 100

## 2022-03-12 MED ORDER — SODIUM BICARBONATE 8.4 % IV SOLN
100.0000 meq | Freq: Once | INTRAVENOUS | Status: AC
  Administered 2022-03-12: 100 meq via INTRAVENOUS
  Filled 2022-03-12: qty 100

## 2022-03-12 MED ORDER — CALCIUM CHLORIDE 10 % IV SOLN
1.0000 g | Freq: Once | INTRAVENOUS | Status: AC
Start: 2022-03-12 — End: 2022-03-12
  Administered 2022-03-12: 1 g via INTRAVENOUS

## 2022-03-12 MED ORDER — IMMUNE GLOBULIN (HUMAN) 10 GM/100ML IV SOLN
500.0000 mg/kg | Freq: Once | INTRAVENOUS | Status: AC
  Administered 2022-03-12: 30 g via INTRAVENOUS
  Filled 2022-03-12: qty 300

## 2022-03-12 MED ORDER — SODIUM BICARBONATE 8.4 % IV SOLN
INTRAVENOUS | Status: AC
  Filled 2022-03-12: qty 50

## 2022-03-12 MED ORDER — HYDROCORTISONE 0.5 % EX CREA: TOPICAL_CREAM | Freq: Three times a day (TID) | CUTANEOUS | Status: DC | PRN

## 2022-03-12 MED ORDER — LINEZOLID 600 MG/300ML IV SOLN
600.0000 mg | Freq: Two times a day (BID) | INTRAVENOUS | Status: DC
  Filled 2022-03-12: qty 300

## 2022-03-12 MED ORDER — SODIUM CHLORIDE 0.9 % IV SOLN
100.0000 mg | INTRAVENOUS | Status: DC
  Administered 2022-03-12: 100 mg via INTRAVENOUS
  Filled 2022-03-12 (×2): qty 5

## 2022-03-12 MED ORDER — METRONIDAZOLE 500 MG/100ML IV SOLN
500.0000 mg | Freq: Three times a day (TID) | INTRAVENOUS | Status: DC
  Administered 2022-03-12 – 2022-03-13 (×3): 500 mg via INTRAVENOUS
  Filled 2022-03-12 (×3): qty 100

## 2022-03-12 MED ORDER — DEXTROSE 50 % IV SOLN
12.5000 g | INTRAVENOUS | Status: AC
  Administered 2022-03-12: 12.5 g via INTRAVENOUS
  Filled 2022-03-12: qty 50

## 2022-03-12 MED ORDER — FAT EMULSION PLANT BASED 20% (INTRALIPID)IV EMUL
250.0000 mL | Freq: Once | INTRAVENOUS | Status: AC
  Administered 2022-03-12: 250 mL via INTRAVENOUS
  Filled 2022-03-12: qty 250

## 2022-03-12 MED ORDER — STERILE WATER FOR INJECTION IV SOLN
INTRAVENOUS | Status: DC
  Filled 2022-03-12 (×18): qty 150

## 2022-03-12 MED ORDER — SODIUM CHLORIDE 0.9% FLUSH: 10.0000 mL | INTRAVENOUS | Status: DC | PRN

## 2022-03-12 MED ORDER — CALCIUM CHLORIDE 10 % IV SOLN
INTRAVENOUS | Status: AC
  Filled 2022-03-12: qty 10

## 2022-03-12 MED ORDER — DEXTROSE 50 % IV SOLN
25.0000 g | INTRAVENOUS | Status: AC
  Administered 2022-03-12: 25 g via INTRAVENOUS

## 2022-03-12 MED ORDER — SODIUM BICARBONATE 8.4 % IV SOLN
50.0000 meq | INTRAVENOUS | Status: AC
  Administered 2022-03-12: 50 meq via INTRAVENOUS

## 2022-03-12 MED ORDER — DEXTROSE 50 % IV SOLN
INTRAVENOUS | Status: AC
  Filled 2022-03-12: qty 50

## 2022-03-12 MED ORDER — DEXTROSE 50 % IV SOLN: 1.0000 | INTRAVENOUS | Status: AC

## 2022-03-12 MED ORDER — SODIUM CHLORIDE 0.9 % IV SOLN
8.0000 mg/kg | Freq: Every day | INTRAVENOUS | Status: DC
  Administered 2022-03-12: 650 mg via INTRAVENOUS
  Filled 2022-03-12 (×2): qty 13

## 2022-03-12 MED ORDER — SODIUM BICARBONATE 8.4 % IV SOLN
50.0000 meq | Freq: Once | INTRAVENOUS | Status: AC
  Administered 2022-03-12: 50 meq via INTRAVENOUS

## 2022-03-12 MED ORDER — STERILE WATER FOR INJECTION IV SOLN
INTRAVENOUS | Status: DC
  Filled 2022-03-12 (×15): qty 150

## 2022-03-12 MED ORDER — METRONIDAZOLE 500 MG PO TABS: 500.0000 mg | ORAL_TABLET | Freq: Three times a day (TID) | ORAL | Status: DC

## 2022-03-12 MED ORDER — SODIUM BICARBONATE 8.4 % IV SOLN
50.0000 meq | Freq: Once | INTRAVENOUS | Status: AC
Start: 2022-03-12 — End: 2022-03-12
  Administered 2022-03-12: 50 meq via INTRAVENOUS

## 2022-03-12 MED ORDER — DEXTROSE 50 % IV SOLN
INTRAVENOUS | Status: AC
  Administered 2022-03-12: 50 mL via INTRAVENOUS
  Filled 2022-03-12: qty 50

## 2022-03-12 NOTE — Progress Notes (Signed)
Peripherally Inserted Central Catheter Placement  The IV Nurse has discussed with the patient and/or persons authorized to consent for the patient, the purpose of this procedure and the potential benefits and risks involved with this procedure.  The benefits include less needle sticks, lab draws from the catheter, and the patient may be discharged home with the catheter. Risks include, but not limited to, infection, bleeding, blood clot (thrombus formation), and puncture of an artery; nerve damage and irregular heartbeat and possibility to perform a PICC exchange if needed/ordered by physician.  Alternatives to this procedure were also discussed.  Bard Power PICC patient education guide, fact sheet on infection prevention and patient information card has been provided to patient /or left at bedside.    PICC Placement Documentation  PICC Double Lumen 52/84/13 Right Basilic 31 cm 0 cm (Active)  Indication for Insertion or Continuance of Line Vasoactive infusions;Limited venous access - need for IV therapy >5 days (PICC only);Poor Vasculature-patient has had multiple peripheral attempts or PIVs lasting less than 24 hours;Prolonged intravenous therapies;Chronic illness with exacerbations (CF, Sickle Cell, etc.) 03/12/22 1155  Exposed Catheter (cm) 0 cm 03/12/22 1155  Site Assessment Clean, Dry, Intact 03/12/22 1155  Lumen #1 Status Flushed;Saline locked;Blood return noted 03/12/22 1155  Lumen #2 Status Flushed;Saline locked;Blood return noted 03/12/22 1155  Dressing Type Transparent;Securing device 03/12/22 1155  Dressing Status Antimicrobial disc in place;Clean, Dry, Intact 03/12/22 1155  Safety Lock Not Applicable 24/40/10 2725  Line Care Connections checked and tightened 03/12/22 1155  Line Adjustment (NICU/IV Team Only) No 03/12/22 1155  Dressing Intervention New dressing 03/12/22 1155  Dressing Change Due 03/19/22 03/12/22 1155       Rolena Infante 03/12/2022, 11:56 AM

## 2022-03-12 NOTE — Progress Notes (Signed)
  Echocardiogram 2D Echocardiogram has been performed.  Darlina Sicilian M 03/12/2022, 9:02 AM

## 2022-03-12 NOTE — Consult Note (Signed)
[facility] Surgery Consult Note  Lauren Osborn 1976-06-04  [id number].    Requesting MD: June Leap Chief Complaint/Reason for Consult: rash  HPI:  Lauren Osborn is a 46 y.o. female PMH ESRD on PD, OSA and CHF who was admitted to [facility] 6/26 after failing outpatient management for lower extremity cellulitis. Patient was transferred to the ICU yesterday due to worsening hypotension and lactic acidosis. CT abdomen/pelvis yesterday negative for ischemic bowel or intraabdominal source of worsening illness. Cultures sent from PD catheter 7/6 negative. She has had increasing pressor requirements and persistent lactic acid >9. Noted to have worsening rash extending proximally into the thigh and pannus today. General surgery asked to see.  Abdominal surgical history: hysterectomy, c section   Family History  Problem Relation Age of Onset   Breast cancer Mother    Other Paternal Grandfather        house fire   Other Maternal Grandmother        house fire   Congestive Heart Failure Maternal Grandfather    Colon cancer Father 55   Diverticulitis Brother    Diverticulitis Sister    Colon polyps Sister 37   Diabetes Daughter        borderline    Past Medical History:  Diagnosis Date   Abnormal bilirubin test    Cholelithiasis    Chronic kidney disease, stage 3b (HCC)    Diastolic congestive heart failure (HCC)    Esophagitis    Essential hypertension    Fibroids    Gastritis    GERD (gastroesophageal reflux disease)    Gout    Liver hemangioma    Morbid obesity ([place])    Normocytic anemia    OSA (obstructive sleep apnea)    Thyroid nodule    Tubular adenoma     Past Surgical History:  Procedure Laterality Date   AV FISTULA PLACEMENT Left 08/19/2021   Procedure: LEFT ARM ARTERIOVENOUS (AV) FISTULA;  Surgeon: Serafina Mitchell, MD;  Location: [place];  Service: Vascular;  Laterality: Left;   [place] Left 11/24/2021   Procedure: LEFT SECOND STAGE  [place];  Surgeon: Serafina Mitchell, MD;  Location: [place];  Service: Vascular;  Laterality: Left;   BIOPSY  08/03/2020   Procedure: BIOPSY;  Surgeon: Eloise Harman, DO;  [facility];  Service: Endoscopy;;  duodenal gastric   CESAREAN SECTION     COLONOSCOPY WITH PROPOFOL N/A 08/03/2020    Surgeon: Hurshel Keys K, DO; 5 mm tubular adenoma in the sigmoid colon, nonbleeding internal hemorrhoids.  Recommended repeat in 5 years.   ESOPHAGOGASTRODUODENOSCOPY (EGD) WITH PROPOFOL N/A 08/03/2020   Surgeon: Eloise Harman, DO; LA grade C esophagitis without bleeding, gastritis with erythema and shallow ulcerations in gastric antrum biopsied (negative for H. pylori), normal examined duodenum s/p biopsy (benign).   HYSTERECTOMY ABDOMINAL WITH SALPINGECTOMY  2008   IR FLUORO GUIDE CV LINE RIGHT  08/15/2021   IR US GUIDE VASC ACCESS RIGHT  08/15/2021   POLYPECTOMY  08/03/2020   Procedure: POLYPECTOMY;  Surgeon: Eloise Harman, DO;  [facility];  Service: Endoscopy;;  colon   RIGHT HEART CATH N/A 07/26/2021   Procedure: RIGHT HEART CATH;  Surgeon: Larey Dresser, MD;  Location: [place] CV [facility];  Service: Cardiovascular;  Laterality: N/A;   THYROIDECTOMY Right 10/21/2018   Procedure: RIGHT HEMITHYROIDECTOMY;  Surgeon: Leta Baptist, MD;  Location: [place];  Service: ENT;  Laterality: Right;  Social History:  reports that she quit smoking about 6 years ago. Her smoking use included cigarettes. She has a 10.00 pack-year smoking history. She has never been exposed to tobacco smoke. She has never used smokeless tobacco. She reports that she does not currently use alcohol. She reports that she does not use drugs.  Allergies:  Allergies  Allergen Reactions   Oxycodone-Acetaminophen Hives and Other (See Comments)    Medications Prior to Admission  Medication Sig Dispense Refill   acetaminophen (TYLENOL) 500 MG tablet Take 1,000  mg by mouth every 6 (six) hours as needed for moderate pain or headache.     allopurinol (ZYLOPRIM) 100 MG tablet Take 1 tablet (100 mg total) by mouth 3 (three) times a week. (Patient taking differently: Take 100 mg by mouth every Monday, Wednesday, and Friday.) 36 tablet 3   [EXPIRED] cefdinir (OMNICEF) 300 MG capsule Take 1 capsule (300 mg total) by mouth every other day for 10 days. 5 capsule 0   doxycycline (VIBRAMYCIN) 100 MG capsule Take 1 capsule (100 mg total) by mouth 2 (two) times daily. 20 capsule 0   furosemide (LASIX) 80 MG tablet Take 80 mg by mouth daily.     midodrine (PROAMATINE) 5 MG tablet TAKE 3 TABLETS BY MOUTH 3 TIMES A DAY WITH MEALS (Patient taking differently: Take 15 mg by mouth 3 (three) times daily with meals.) 270 tablet 2   pantoprazole (PROTONIX) 40 MG tablet TAKE 1 TABLET BY MOUTH EVERY DAY 90 tablet 1   TART CHERRY PO Take 1 tablet by mouth 3 (three) times a week. Tuesdays, Saturdays & Sundays in the morning.     traMADol (ULTRAM) 50 MG tablet Take 1 tablet (50 mg total) by mouth every 6 (six) hours as needed. (Patient not taking: Reported on 02/01/2022) 20 tablet 0    Prior to Admission medications   Medication Sig Start Date End Date Taking? Authorizing Provider  acetaminophen (TYLENOL) 500 MG tablet Take 1,000 mg by mouth every 6 (six) hours as needed for moderate pain or headache.   Yes [provider]  allopurinol (ZYLOPRIM) 100 MG tablet Take 1 tablet (100 mg total) by mouth 3 (three) times a week. Patient taking differently: Take 100 mg by mouth every Monday, Wednesday, and Friday. 08/26/21  Yes Strader, [place] M, PA-C  doxycycline (VIBRAMYCIN) 100 MG capsule Take 1 capsule (100 mg total) by mouth 2 (two) times daily. 02/21/22  Yes Pollina, Gwenyth Allegra, MD  furosemide (LASIX) 80 MG tablet Take 80 mg by mouth daily. 01/26/22  Yes [provider]  midodrine (PROAMATINE) 5 MG tablet TAKE 3 TABLETS BY MOUTH 3 TIMES A DAY WITH MEALS Patient  taking differently: Take 15 mg by mouth 3 (three) times daily with meals. 02/15/22  Yes Satira Sark, MD  pantoprazole (PROTONIX) 40 MG tablet TAKE 1 TABLET BY MOUTH EVERY DAY 01/23/22  Yes Strader, [place] M, PA-C  TART CHERRY PO Take 1 tablet by mouth 3 (three) times a week. Tuesdays, Saturdays & Sundays in the morning.   Yes [provider]  linezolid (ZYVOX) 600 MG tablet Take 1 tablet (600 mg total) by mouth 2 (two) times daily for 10 days. 03/03/22 04-04-2022  Vu, Rockey Situ, MD  traMADol (ULTRAM) 50 MG tablet Take 1 tablet (50 mg total) by mouth every 6 (six) hours as needed. Patient not taking: Reported on 02/01/2022 11/24/21 11/24/22  [name], PA-C    Blood pressure (!) 38/26, pulse (!) 127, temperature (!) 90 F (  32.2 C), temperature source Oral, resp. rate 13, height 5\' 3"  (1.6 m), weight 82.8 kg, SpO2 (!) 61 %. Physical Exam: General: chronically ill appearing Heart: tachycardic Lungs: Respiratory effort nonlabored Abd: induration/thickened skin on pannus, no distension or significant tenderness, no peritonitis Skin: rash pictured below purple/red on the thigh extending to lower pannus with intermittent bullae. No crepitus. No purulent drainage      Results for orders placed or performed during the hospital encounter of 02/17/2022 (from the past 48 hour(s))  CBC     Status: Abnormal   Collection Time: 03/11/22  6:23 AM  Result Value Ref Range   WBC 29.8 (H) 4.0 - 10.5 K/uL   RBC 3.68 (L) 3.87 - 5.11 MIL/uL   Hemoglobin 11.9 (L) 12.0 - 15.0 g/dL   HCT 05/12/22 20.6 - 00.7 %   MCV 98.4 80.0 - 100.0 fL   MCH 32.3 26.0 - 34.0 pg   MCHC 32.9 30.0 - 36.0 g/dL   RDW 99.8 (H) 62.1 - 57.8 %   Platelets 263 150 - 400 K/uL   nRBC 0.9 (H) 0.0 - 0.2 %    Comment: Performed at [facility], [address] [place], [place]  Renal function panel     Status: Abnormal   Collection Time: 03/11/22  6:23 AM  Result Value Ref Range   Sodium 123 (L) 135 - 145 mmol/L    Potassium 5.2 (H) 3.5 - 5.1 mmol/L   Chloride 84 (L) 98 - 111 mmol/L   CO2 8 (L) 22 - 32 mmol/L   Glucose, Bld 89 70 - 99 mg/dL    Comment: Glucose reference range applies only to samples taken after fasting for at least 8 hours.   BUN 82 (H) 6 - 20 mg/dL   Creatinine, Ser 05/12/22 (H) 0.44 - 1.00 mg/dL   Calcium 7.8 (L) 8.9 - 10.3 mg/dL   Phosphorus 1.95 (H) 2.5 - 4.6 mg/dL   Albumin 37.6 (L) 3.5 - 5.0 g/dL    Comment: REPEATED TO VERIFY   GFR, Estimated 6 (L) >60 mL/min    Comment: (NOTE) Calculated using the CKD-EPI Creatinine Equation (2021)    Anion gap 31 (H) 5 - 15    Comment: REPEATED TO VERIFY Performed at [facility], [address], [place], [place]   MRSA Next Gen by PCR, Nasal     Status: None   Collection Time: 03/11/22 12:24 PM   Specimen: Nasal Mucosa; Nasal Swab  Result Value Ref Range   MRSA by PCR Next Gen NOT DETECTED NOT DETECTED    Comment: (NOTE) The GeneXpert MRSA Assay (FDA approved for NASAL specimens only), is one component of a comprehensive MRSA colonization surveillance program. It is not intended to diagnose MRSA infection nor to guide or monitor treatment for MRSA infections. Test performance is not FDA approved in patients less than 23 years old. Performed at [facility], [address] [place], [place]   Lactic acid, plasma     Status: Abnormal   Collection Time: 03/11/22 12:58 PM  Result Value Ref Range   Lactic Acid, Venous >9.0 (HH) 0.5 - 1.9 mmol/L    Comment: CRITICAL RESULT CALLED TO, READ BACK BY AND VERIFIED WITH: M.TALLENT,RN 03/11/2022 AT 1402 AHUGHES Performed at [facility], [address] [place], [place]   Cooxemetry Panel (carboxy, met, total hgb, O2 sat)     Status: Abnormal   Collection Time: 03/11/22  3:51 PM  Result Value Ref Range   Total hemoglobin 10.6 (L) 12.0 - 16.0 g/dL   O2 Saturation 83.2 %   Carboxyhemoglobin 1.5 0.5 - 1.5 %   Methemoglobin <0.7 0.0 - 1.5 %     Comment: Performed at [facility] [address] [place], [place] [address]  Glucose, capillary     Status: Abnormal   Collection Time: 03/11/22  4:09 PM  Result Value Ref Range   Glucose-Capillary 47 (L) 70 - 99 mg/dL    Comment: Glucose reference range applies only to samples taken after fasting for at least 8 hours.  I-STAT 7, (LYTES, BLD GAS, ICA, H+H)     Status: Abnormal   Collection Time: 03/11/22  4:12 PM  Result Value Ref Range   pH, Arterial 7.175 (LL) 7.35 - 7.45   pCO2 arterial 34.2 32 - 48 mmHg   pO2, Arterial 114 (H) 83 - 108 mmHg   Bicarbonate 12.6 (L) 20.0 - 28.0 mmol/L   TCO2 14 (L) 22 - 32 mmol/L   O2 Saturation 97 %   Acid-base deficit 15.0 (H) 0.0 - 2.0 mmol/L   Sodium 119 (LL) 135 - 145 mmol/L   Potassium 3.9 3.5 - 5.1 mmol/L   Calcium, Ion 1.06 (L) 1.15 - 1.40 mmol/L   HCT 33.0 (L) 36.0 - 46.0 %   Hemoglobin 11.2 (L) 12.0 - 15.0 g/dL   Collection site art line    Drawn by Nurse    Sample type ARTERIAL    Comment NOTIFIED PHYSICIAN   Lactic acid, plasma     Status: Abnormal   Collection Time: 03/11/22  4:25 PM  Result Value Ref Range   Lactic Acid, Venous >9.0 (HH) 0.5 - 1.9 mmol/L    Comment: CRITICAL VALUE NOTED.  VALUE IS CONSISTENT WITH PREVIOUSLY REPORTED AND CALLED VALUE. Performed at [facility], [place] [address], [place], [place] [address]   Comprehensive metabolic panel     Status: Abnormal   Collection Time: 03/11/22  4:25 PM  Result Value Ref Range   Sodium 122 (L) 135 - 145 mmol/L   Potassium 3.7 3.5 - 5.1 mmol/L   Chloride 82 (L) 98 - 111 mmol/L   CO2 10 (L) 22 - 32 mmol/L   Glucose, Bld 161 (H) 70 - 99 mg/dL    Comment: Glucose reference range applies only to samples taken after fasting for at least 8 hours.   BUN 79 (H) 6 - 20 mg/dL   Creatinine, Ser 7.58 (H) 0.44 - 1.00 mg/dL   Calcium 8.2 (L) 8.9 - 10.3 mg/dL   Total Protein 4.8 (L) 6.5 - 8.1 g/dL   Albumin 2.4 (L) 3.5 - 5.0 g/dL   AST 56 (H) 15 - 41 U/L   ALT 32 0  - 44 U/L   Alkaline Phosphatase 150 (H) 38 - 126 U/L   Total Bilirubin 1.2 0.3 - 1.2 mg/dL   GFR, Estimated 6 (L) >60 mL/min    Comment: (NOTE) Calculated using the CKD-EPI Creatinine Equation (2021)    Anion gap 30 (H) 5 - 15    Comment: Electrolytes repeated to confirm. Performed at [facility], [place] [address] [place], [place] [address]   Glucose, capillary     Status: Abnormal   Collection Time: 03/11/22  4:28 PM  Result Value Ref Range   Glucose-Capillary 164 (H) 70 - 99 mg/dL    Comment: Glucose reference range applies only to samples taken after fasting for  at least 8 hours.   Comment 1 Notify RN    Comment 2 Document in Chart   Lactic acid, plasma     Status: Abnormal   Collection Time: 03/11/22  6:30 PM  Result Value Ref Range   Lactic Acid, Venous >9.0 (HH) 0.5 - 1.9 mmol/L    Comment: CRITICAL VALUE NOTED.  VALUE IS CONSISTENT WITH PREVIOUSLY REPORTED AND CALLED VALUE. Performed at [facility], [place] [address] [place], [place] [address]   TSH     Status: Abnormal   Collection Time: 03/11/22  6:30 PM  Result Value Ref Range   TSH 6.113 (H) 0.350 - 4.500 uIU/mL    Comment: Performed by a 3rd Generation assay with a functional sensitivity of <=0.01 uIU/mL. Performed at [facility], [place] [address], [place], [place] [address]   T4, free     Status: None   Collection Time: 03/11/22  6:30 PM  Result Value Ref Range   Free T4 0.73 0.61 - 1.12 ng/dL    Comment: (NOTE) Biotin ingestion may interfere with free T4 tests. If the results are inconsistent with the TSH level, previous test results, or the clinical presentation, then consider biotin interference. If needed, order repeat testing after stopping biotin. Performed at [facility], [place] [address] [place], [place] [address]   Glucose, capillary     Status: Abnormal   Collection Time: 03/11/22  7:49 PM  Result Value Ref Range   Glucose-Capillary 102 (H) 70 - 99 mg/dL    Comment: Glucose  reference range applies only to samples taken after fasting for at least 8 hours.  CK     Status: None   Collection Time: 03/11/22  7:53 PM  Result Value Ref Range   Total CK 94 38 - 234 U/L    Comment: Performed at [facility], [place] [address], [place], [place] [address]  Glucose, capillary     Status: None   Collection Time: 03/11/22 10:21 PM  Result Value Ref Range   Glucose-Capillary 89 70 - 99 mg/dL    Comment: Glucose reference range applies only to samples taken after fasting for at least 8 hours.   Comment 1 Notify RN   Lactic acid, plasma     Status: Abnormal   Collection Time: 03/11/22 10:24 PM  Result Value Ref Range   Lactic Acid, Venous >9.0 (HH) 0.5 - 1.9 mmol/L    Comment: CRITICAL VALUE NOTED.  VALUE IS CONSISTENT WITH PREVIOUSLY REPORTED AND CALLED VALUE. Performed at [facility], [place] [address], [place], [place] [address]   I-STAT 7, (LYTES, BLD GAS, ICA, H+H)     Status: Abnormal   Collection Time: 03/11/22 10:24 PM  Result Value Ref Range   pH, Arterial 7.298 (L) 7.35 - 7.45   pCO2 arterial 29.8 (L) 32 - 48 mmHg   pO2, Arterial 139 (H) 83 - 108 mmHg   Bicarbonate 15.3 (L) 20.0 - 28.0 mmol/L   TCO2 16 (L) 22 - 32 mmol/L   O2 Saturation 99 %   Acid-base deficit 11.0 (H) 0.0 - 2.0 mmol/L   Sodium 125 (L) 135 - 145 mmol/L   Potassium 3.7 3.5 - 5.1 mmol/L   Calcium, Ion 1.00 (L) 1.15 - 1.40 mmol/L   HCT 37.0 36.0 - 46.0 %   Hemoglobin 12.6 12.0 - 15.0 g/dL   Patient temperature 91.9 F    Sample type ARTERIAL   Lactic acid, plasma  Status: Abnormal   Collection Time: 03/12/22  1:05 AM  Result Value Ref Range   Lactic Acid, Venous >9.0 (HH) 0.5 - 1.9 mmol/L    Comment: CRITICAL VALUE NOTED.  VALUE IS CONSISTENT WITH PREVIOUSLY REPORTED AND CALLED VALUE. Performed at [facility], [place] [address] [place], [place] [address]   Glucose, capillary     Status: None   Collection Time: 03/12/22  1:07 AM  Result Value Ref Range    Glucose-Capillary 74 70 - 99 mg/dL    Comment: Glucose reference range applies only to samples taken after fasting for at least 8 hours.  Glucose, capillary     Status: Abnormal   Collection Time: 03/12/22  4:18 AM  Result Value Ref Range   Glucose-Capillary 63 (L) 70 - 99 mg/dL    Comment: Glucose reference range applies only to samples taken after fasting for at least 8 hours.  CBC     Status: Abnormal   Collection Time: 03/12/22  4:20 AM  Result Value Ref Range   WBC 26.7 (H) 4.0 - 10.5 K/uL   RBC 3.30 (L) 3.87 - 5.11 MIL/uL   Hemoglobin 11.0 (L) 12.0 - 15.0 g/dL   HCT 30.1 (L) 36.0 - 46.0 %   MCV 91.2 80.0 - 100.0 fL    Comment: DELTA CHECK NOTED GLUCOSE CHANGE    MCH 33.3 26.0 - 34.0 pg   MCHC 36.5 (H) 30.0 - 36.0 g/dL   RDW 15.6 (H) 11.5 - 15.5 %   Platelets 148 (L) 150 - 400 K/uL   nRBC 1.9 (H) 0.0 - 0.2 %    Comment: Performed at [facility], [place] [address], [place], [place] [address]  Magnesium     Status: None   Collection Time: 03/12/22  4:20 AM  Result Value Ref Range   Magnesium 2.1 1.7 - 2.4 mg/dL    Comment: Performed at [facility] [address], [place], [place] [address]  APTT     Status: Abnormal   Collection Time: 03/12/22  4:20 AM  Result Value Ref Range   aPTT 91 (H) 24 - 36 seconds    Comment:        IF BASELINE aPTT IS ELEVATED, SUGGEST PATIENT RISK ASSESSMENT BE USED TO DETERMINE APPROPRIATE ANTICOAGULANT THERAPY. Performed at [facility], [place] [address], [place], [place] [address]   Renal function panel     Status: Abnormal   Collection Time: 03/12/22  4:20 AM  Result Value Ref Range   Sodium 131 (L) 135 - 145 mmol/L   Potassium 4.0 3.5 - 5.1 mmol/L   Chloride 89 (L) 98 - 111 mmol/L   CO2 13 (L) 22 - 32 mmol/L   Glucose, Bld 66 (L) 70 - 99 mg/dL    Comment: Glucose reference range applies only to samples taken after fasting for at least 8 hours.   BUN 49 (H) 6 - 20 mg/dL   Creatinine, Ser 4.31 (H) 0.44 - 1.00 mg/dL     Comment: DELTA CHECK NOTED DIALYSIS    Calcium 8.2 (L) 8.9 - 10.3 mg/dL   Phosphorus 7.2 (H) 2.5 - 4.6 mg/dL   Albumin 2.2 (L) 3.5 - 5.0 g/dL   GFR, Estimated 12 (L) >60 mL/min    Comment: (NOTE) Calculated using the CKD-EPI Creatinine Equation (2021)    Anion gap 29 (H) 5 - 15    Comment: REPEATED TO VERIFY Performed at [facility] [address]  [place], [place] [address]   Glucose, capillary     Status: Abnormal   Collection Time: 03/12/22  5:02 AM  Result Value Ref Range   Glucose-Capillary 67 (L) 70 - 99 mg/dL    Comment: Glucose reference range applies only to samples taken after fasting for at least 8 hours.  Glucose, capillary     Status: Abnormal   Collection Time: 03/12/22  6:12 AM  Result Value Ref Range   Glucose-Capillary 118 (H) 70 - 99 mg/dL    Comment: Glucose reference range applies only to samples taken after fasting for at least 8 hours.  I-STAT 7, (LYTES, BLD GAS, ICA, H+H)     Status: Abnormal   Collection Time: 03/12/22  6:15 AM  Result Value Ref Range   pH, Arterial 7.241 (L) 7.35 - 7.45   pCO2 arterial 27.9 (L) 32 - 48 mmHg   pO2, Arterial 103 83 - 108 mmHg   Bicarbonate 12.8 (L) 20.0 - 28.0 mmol/L   TCO2 14 (L) 22 - 32 mmol/L   O2 Saturation 98 %   Acid-base deficit 15.0 (H) 0.0 - 2.0 mmol/L   Sodium 127 (L) 135 - 145 mmol/L   Potassium 4.1 3.5 - 5.1 mmol/L   Calcium, Ion 1.02 (L) 1.15 - 1.40 mmol/L   HCT 35.0 (L) 36.0 - 46.0 %   Hemoglobin 11.9 (L) 12.0 - 15.0 g/dL   Patient temperature 90.0 F    Sample type ARTERIAL    Comment NOTIFIED PHYSICIAN   Glucose, capillary     Status: Abnormal   Collection Time: 03/12/22  7:49 AM  Result Value Ref Range   Glucose-Capillary 100 (H) 70 - 99 mg/dL    Comment: Glucose reference range applies only to samples taken after fasting for at least 8 hours.   CT ABDOMEN PELVIS WO CONTRAST  Result Date: 03/11/2022 CLINICAL DATA:  Sepsis, source EXAM: CT ABDOMEN AND PELVIS WITHOUT CONTRAST TECHNIQUE:  Multidetector CT imaging of the abdomen and pelvis was performed following the standard protocol without IV contrast. RADIATION DOSE REDUCTION: This exam was performed according to the departmental dose-optimization program which includes automated exposure control, adjustment of the mA and/or kV according to patient size and/or use of iterative reconstruction technique. COMPARISON:  MR abdomen, 12/16/2020 FINDINGS: Lower chest: Mild, diffuse ground-glass opacity bilateral lung bases and trace bilateral pleural effusions. Trace pericardial effusion Hepatobiliary: Somewhat coarse, nodular contour of the liver subtle lesion of the central liver dome, previously characterized by MR as a benign hemangioma (series 3, image 9). Large rim calcified gallstone. No gallbladder wall thickening, or biliary dilatation. Pancreas: Unremarkable. No pancreatic ductal dilatation or surrounding inflammatory changes. Spleen: Normal in size without significant abnormality. Adrenals/Urinary Tract: Adrenal glands are unremarkable. Kidneys are normal, without renal calculi, solid lesion, or hydronephrosis. Bladder is unremarkable. Stomach/Bowel: Stomach is within normal limits. Appendix appears normal. No evidence of bowel wall thickening, distention, or inflammatory changes. Vascular/Lymphatic: Scattered aortic atherosclerosis. No enlarged abdominal or pelvic lymph nodes. Reproductive: No mass or other significant abnormality. Other: Severe anasarca. Tunneled Tenckhoff type peritoneal dialysis catheter tip in the mid pelvis (series 3, image 24). Moderate volume ascites throughout the abdomen and pelvis. Musculoskeletal: No acute or significant osseous findings. IMPRESSION: 1. Mild, diffuse ground-glass opacity bilateral lung bases and trace bilateral pleural effusions, consistent with pulmonary edema. 2. Moderate volume ascites throughout the abdomen and pelvis, presumably peritoneal dialysate. 3. Peritoneal dialysis catheter, tip in the  mid pelvis. 4. Cholelithiasis without evidence of acute cholecystitis. 5. Somewhat coarse,  nodular contour of the liver, suggestive of cirrhosis. 6. Severe anasarca. Aortic Atherosclerosis (ICD10-I70.0). Electronically Signed   By: Delanna Ahmadi M.D.   On: 03/11/2022 19:19   DG CHEST PORT 1 VIEW  Result Date: 03/11/2022 CLINICAL DATA:  Central line placement. EXAM: PORTABLE CHEST 1 VIEW COMPARISON:  Chest x-ray 02/10/2022 FINDINGS: Right-sided central venous catheter tip projects over the mid SVC. The heart size and mediastinal contours are within normal limits. Both lungs are clear. The visualized skeletal structures are unremarkable. IMPRESSION: 1. Right-sided central venous catheter tip projects over the mid SVC. 2. No acute cardiopulmonary process. Electronically Signed   By: Ronney Asters M.D.   On: 03/11/2022 16:25      Assessment/Plan Shock Lactic acidosis Rash - Patient is critically ill with unclear source. I do not think she has ischemic bowel requiring exploratory laparotomy, and I do not think this rash is a necrotizing soft tissue infection. Unfortunately I don't think surgery has much to offer. Discussed with primary team. Agree with broad spectrum antibiotics. CCM has ordered IVIG.   I reviewed CCM notes, last 24 h vitals and pain scores, last 48 h intake and output, last 24 h labs and trends, and last 24 h imaging results.   Wellington Hampshire, [facility] Surgery 03/12/2022, 9:53 AM Please see Amion for pager number during day hours 7:00am-4:30pm

## 2022-03-12 NOTE — Progress Notes (Signed)
eLink Physician-Brief Progress Note Patient Name: Lauren Osborn DOB: [birth year] MRN: [id number]   Date of Service  03/12/2022  HPI/Events of Note  Persistent lactic acidosis of 9  eICU Interventions  Mild, diffuse ground-glass opacity bilateral lung bases and trace bilateral pleural effusions, consistent with pulmonary edema. 2. Moderate volume ascites throughout the abdomen and pelvis, presumably peritoneal dialysate. 3. Peritoneal dialysis catheter, tip in the mid pelvis. 4. Cholelithiasis without evidence of acute cholecystitis. 5. Somewhat coarse, nodular contour of the liver, suggestive of cirrhosis. 6. Severe anasarca  Continue current therapies     Intervention Category Major Interventions: Acid-Base disturbance - evaluation and management  Judd Lien 03/12/2022, 3:40 AM

## 2022-03-12 NOTE — Progress Notes (Signed)
Pharmacy Antibiotic Note  Lauren Osborn is a 46 y.o. female admitted on 02/28/2022 with concern for cellulitis - now worsening. On CRRT and on multiple pressors  Plts have declined by about 50%  Plan: Continue cefepime DC Zyvox Add metronidazole and micafungin per MD Add daptomycin 8 mg/kg q24h  Height: '5\' 3"'$  (160 cm) Weight: 82.8 kg (182 lb 8.7 oz) IBW/kg (Calculated) : 52.4  Temp (24hrs), Avg:90.9 F (32.7 C), Min:90 F (32.2 C), Max:91.9 F (33.3 C)  Recent Labs  Lab 03/08/22 0226 03/09/22 0945 03/11/22 0623 03/11/22 1258 03/11/22 1625 03/11/22 1830 03/11/22 2224 03/12/22 0105 03/12/22 0420  WBC 22.9* 29.0* 29.8*  --   --   --   --   --  26.7*  CREATININE 7.54* 7.95* 7.64*  --  7.58*  --   --   --  4.31*  LATICACIDVEN  --   --   --  >9.0* >9.0* >9.0* >9.0* >9.0*  --     Estimated Creatinine Clearance: 16.5 mL/min (A) (by C-G formula based on SCr of 4.31 mg/dL (H)).    Allergies  Allergen Reactions   Oxycodone-Acetaminophen Hives and Other (See Comments)   Barth Kirks, PharmD, [facility] Clinical Pharmacist [phone]  Please check AMION for all [facility] numbers  03/12/2022 2:04 PM

## 2022-03-12 NOTE — Progress Notes (Signed)
[facility] for Infectious Disease  Date of Admission:  02/26/2022     Line: 7/8-c left radial a line 7/8-c rue picc 7/8-c right HD catheter  Present prior to arrival right lower abd peritoneal dialysis catheter   Abx: 7/09-c cefepime 7/09-c daptomycin 7/09-c metronidazole 7/09-c micafungin  6/29-7/09 linezolid  6/26-6/29  Ceftriaxone   6/20-26 doxy/cefdinir                                           Assessment: 46 y.o. female hemangioma, esrd on home peritoneal dialysis (also has chest port and left avf), chronic hypotension on midodrine, admitted for 2 weeks of left LE cellulitis, course complicated by acute onset purpuric skin changes, shock/lactic acidosis   ---------- 7/09 assessment She has been on linezolid since 6/29 for the lle impetigo changes/cellulitis which appears to be better  However has a rising leukocytosis on 7/5 and within the past 24-48 hours had acute onset diffuse symmetric purpuric changes, extreme lactate acid elevation, and persistent low bp requiring pressors  There are some blisters the came on new since levophed started on lower abd and left thigh  I agree with surgery assessment it doesn't appear the purpuric changes with blisters are necrotizing fasciitis  She has had peritonieal fluid analysis x2 the last few days that do not suggest peritonitis or intraabd infection with her chronic peritoneal dialysis  She was transferred to icu 7/08 due to hyptension/hypothermia and need for crrt      I am still perplexed what causes the acute changes Dic being worked up by primary team I added blood cultures today as she hasn't gotten bcx since early admission  It doesn't appear this is related to her previous LLE cellulitis  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 I am not sure if that triggers the purpura fulminan process. Will try to r/o new bacteremia as well. So far no other obvious infectious process. She does have some blurriness of her vision for the past 2 days ?hypotension vs linezolid neurotoxicity  Primary team had given a dose ivig as well today   Plan: Stop linezolid Agree with primary team if dialysis can use binding dialysate for linezolid Can continue bsAbx mica, flagyl, dapto, cefepime Follow up repeat blood cx Follow up dic panel Discussed with primary team  I spent more than 50 minute reviewing data/chart, and coordinating care and >50% direct face to face time providing counseling/discussing diagnostics/treatment plan with patient   Principal Problem:   Sepsis due to cellulitis [facility]) Active Problems:   GERD (gastroesophageal reflux disease)   Hypotension   ESRD (end stage renal disease) (HCC)   Cellulitis   Cellulitis of left leg   Allergies  Allergen Reactions   Oxycodone-Acetaminophen Hives and Other (See Comments)    Scheduled Meds:  Chlorhexidine Gluconate Cloth  6 each Topical Q0600   gabapentin  100 mg Oral QHS   gentamicin cream  1 Application Topical Daily   heparin  5,000 Units Subcutaneous Q8H   lidocaine  2 patch Transdermal Q24H   midodrine  20 mg Oral TID WC   nystatin  5 mL Oral TID   pantoprazole  40 mg Oral Daily  sevelamer carbonate  2,400 mg Oral TID WC   sodium bicarbonate       sodium chloride flush  10-40 mL Intracatheter Q12H   sodium chloride flush  10-40 mL Intracatheter Q12H   Continuous Infusions:  sodium chloride Stopped (03/11/22 1751)   sodium chloride Stopped (03/12/22 0949)   ceFEPime (MAXIPIME) IV Stopped (03/12/22 0559)   dextrose 50 mL/hr at 03/12/22 1300   epinephrine 12 mcg/min (03/12/22 1300)   heparin 10,000 units/ 20 mL infusion syringe 750 Units/hr (03/12/22 1006)   linezolid (ZYVOX) IV     micafungin (MYCAMINE) 100  mg in sodium chloride 0.9 % 100 mL IVPB     norepinephrine (LEVOPHED) Adult infusion 65 mcg/min (03/12/22 1300)   phenylephrine (NEO-SYNEPHRINE) Adult infusion 300 mcg/min (03/12/22 1300)   prismasol BGK 4/2.5 1,500 mL/hr at 03/12/22 1220   sodium bicarbonate 150 mEq in sterile water 1,150 mL infusion 250 mL/hr at 03/12/22 1239   sodium bicarbonate 150 mEq in sterile water 1,150 mL infusion 250 mL/hr at 03/12/22 1317   thiamine injection Stopped (03/11/22 2106)   vasopressin 0.04 Units/min (03/12/22 1300)   PRN Meds:.sodium chloride, acetaminophen **OR** acetaminophen, heparin, heparin, ondansetron **OR** ondansetron (ZOFRAN) IV, mouth rinse, oxyCODONE, sodium bicarbonate, sodium chloride flush, sodium chloride flush   SUBJECTIVE: Patient acutely septic and has purpura Transferred to icu 7/8 for crrt and supportive care  Iv ig given today as well   General surgery evaluated purpuric rash and no concern for nec fasc  Has rising leukcoytosis a few days ago. No other complaint in terms of localizing organ infection  Remains on linezolid since 6/29 for the left LE cellulitis/impetigo which had improved   Very high lactate  7/8 abd pelv ct anasarca/ascites. No air within lower abd wall where the purpura/blisters are seen   Patient does report today 2 days of blurriness  Review of Systems: ROS All other ROS was negative, except mentioned above     OBJECTIVE: Vitals:   03/12/22 1245 03/12/22 1300 03/12/22 1315 03/12/22 1330  BP:      Pulse:      Resp: '12 14 12 14  '$ Temp:      TempSrc:      SpO2:      Weight:      Height:       Body mass index is 32.34 kg/m.  Physical Exam  General/constitutional: acutely ill appearing; under warming blanket HEENT: Normocephalic, Conj Clear, EOMI, Oropharynx clear Neck supple CV: rrr no mrg Lungs: shallow breathing but not tachypneic or in respiratory distress Abd: Soft, Nontender Ext/skin; anasarca; left LE with dressing on  (reviewed recent picture of LLE improved impetigo changes); diffuse bilateral le purpuric change thighs and lower abd with scattered blisters  Neuro: generalized weakness; lethargic MSK: no peripheral joint swelling/warmth  Central line presence: rue picc and right neck hd line site no bleeding/purulence; right abd peritoneal dialysis catheter site no purulence     Lab Results Lab Results  Component Value Date   WBC 26.7 (H) 03/12/2022   HGB 12.2 03/12/2022   HCT 36.0 03/12/2022   MCV 91.2 03/12/2022   PLT 148 (L) 03/12/2022    Lab Results  Component Value Date   CREATININE 4.31 (H) 03/12/2022   BUN 49 (H) 03/12/2022   NA 125 (L) 03/12/2022   K 4.8 03/12/2022   CL 89 (L) 03/12/2022   CO2 13 (L) 03/12/2022    Lab Results  Component Value Date   ALT 32 03/11/2022   AST  56 (H) 03/11/2022   ALKPHOS 150 (H) 03/11/2022   BILITOT 1.2 03/11/2022      Microbiology: Recent Results (from the past 240 hour(s))  Body fluid culture w Gram Stain     Status: None   Collection Time: 03/09/22 12:42 PM   Specimen: Peritoneal Washings; Body Fluid  Result Value Ref Range Status   Specimen Description PERITONEAL  Final   Special Requests PERITONEAL DIALYSIS  Final   Gram Stain   Final    WBC PRESENT, PREDOMINANTLY MONONUCLEAR NO ORGANISMS SEEN CYTOSPIN SMEAR    Culture   Final    NO GROWTH 3 DAYS Performed at [facility], [address] [place], [place] [address]    Report Status 03/12/2022 FINAL  Final  MRSA Next Gen by PCR, Nasal     Status: None   Collection Time: 03/11/22 12:24 PM   Specimen: Nasal Mucosa; Nasal Swab  Result Value Ref Range Status   MRSA by PCR Next Gen NOT DETECTED NOT DETECTED Final    Comment: (NOTE) The GeneXpert MRSA Assay (FDA approved for NASAL specimens only), is one component of a comprehensive MRSA colonization surveillance program. It is not intended to diagnose MRSA infection nor to guide or monitor treatment for MRSA infections. Test  performance is not FDA approved in patients less than 20 years old. Performed at [facility], [place] [address], [place], [place] [address]      Serology:   Imaging: If present, new imagings (plain films, ct scans, and mri) have been personally visualized and interpreted; radiology reports have been reviewed. Decision making incorporated into the Impression / Recommendations.  7/8 ct abd pelv 1. Mild, diffuse ground-glass opacity bilateral lung bases and trace bilateral pleural effusions, consistent with pulmonary edema. 2. Moderate volume ascites throughout the abdomen and pelvis, presumably peritoneal dialysate. 3. Peritoneal dialysis catheter, tip in the mid pelvis. 4. Cholelithiasis without evidence of acute cholecystitis. 5. Somewhat coarse, nodular contour of the liver, suggestive of cirrhosis. 6. Severe anasarca.     7/8 cxr 1. Right-sided central venous catheter tip projects over the mid SVC. 2. No acute cardiopulmonary process.  Jabier Mutton, [place] for Infectious [place] [phone] pager    03/12/2022, 1:52 PM

## 2022-03-12 NOTE — Progress Notes (Signed)
Dr. Valeta Harms notified that this RN and NT unable to get temperature reading oral or axillary today. Monitor temperature given via thermometer 90 degrees. Bair Hugger in place at maximum settings. Patient frequently removing [employer], educated on need for [employer] on as much as able. MD aware of patient removing [employer].  03/12/22 1630 pm patient agreeable to rectal temperature probe. Rectal temp reading 31.5 degrees C. Gerald Leitz, NP notified. Patient educated on [employer] again.

## 2022-03-12 NOTE — Consult Note (Addendum)
NAME:  Lauren Osborn, MRN:  [id number], DOB:  12-Dec-1975, LOS: 12 ADMISSION DATE:  02/15/2022, CONSULTATION DATE:  03/11/2022 REFERRING MD:  Dr. Darrick Meigs - [facility], CHIEF COMPLAINT:  Septic shock with need of CRRT    History of Present Illness:  Lauren Osborn is a 46yo female with a PMH significant for ESRD on peritoneal dialysis, OSA, Diastolic congestive heart failure, HTN, Liver hemangioma, and GERD who presented to the ED 02/07/2022  for complaints of left leg pain. She was evaluated and diagnosis with cellulitis on admit. Of note she failed outpatient antibiotic therapy.   Since admission she has been treated with IV antibiotics for management of cellulitis with minimal improvement in pain and eurhythmia. AM of 7/8 patient was evaluated by Nephrology and given persistent erythema with leukocytosis, hypotension, worsening metabolic acidosis, and rising potasium decision was made to consult PCCM and transfer patient to ICU for initiation of CRRT    Pertinent  Medical History  ESRD on peritoneal dialysis, OSA, Diastolic congestive heart failure, HTN, Liver hemangioma, and GERD  Significant Hospital Events: Including procedures, antibiotic start and stop dates in addition to other pertinent events   6/26 admitted for cellulitis  7/8 transferred to ICU for initiation of CRRT   Interim History / Subjective:   Critically ill, on pressors, cvvhd in ICU   Objective   Blood pressure (!) 38/26, pulse (!) 127, temperature (!) 90 F (32.2 C), temperature source Oral, resp. rate 13, height '5\' 3"'$  (1.6 m), weight 82.8 kg, SpO2 (!) 61 %.        Intake/Output Summary (Last 24 hours) at 03/12/2022 3329 Last data filed at 03/12/2022 0700 Gross per 24 hour  Intake 3016.35 ml  Output 540 ml  Net 2476.35 ml   Filed Weights   03/01/2022 1754 03/09/22 0500 03/12/22 0451  Weight: 89.4 kg 80.8 kg 82.8 kg    Examination: General: chronically ill appearing female  HEENT: NCAT tracking  Neuro: alert, following  commands  CV: RRR, s1 s2  PULM:  CTAB, no crackles no wheeze  GI: tender, thickened skin, redness on abdominal panniculus  Extremities: LLE in bandage  Skin: mottling BL LE extremities   Resolved Hospital Problem list     Assessment & Plan:   Sepsis due cellulitis  -Concern for vascular compromise as leading cause to continued hypotension. She is fully alert and oriented and asymptomatic with documented profound hypotension   -ID evaluated and signed off 6/30, recommenced continued Linezolid x10 days  Progressive hypotension  Shock state, there was concern for faulty BP readings on cuff and a-line, ?subclavian stenosis however new cath placed in femoral aline yesterday which is congruent with low bp  Panniculits? - progressive abdominal wall changes since yesterday, hemorrhagic bulla and mottling, ulceration on the right abdominal wall P: Continue cefepime + linezolid  No clinda due to shortage  WOC  Continue general surgery for any additional recommendations Will give IVIG X1, '500mg'$ /kg   Hyponatremia  -Sodium have progressive dropped with decrease to 123 7/8, ? Hypervolemic  P: Will likely correct with CVVHD   ESRD  -Typically dose peritoneal dialysis  P: HD cath placed  Started CVVHD  Appreciate nephro recs   Abdominal pain with unclear etiology  -PD fluid normal, cultures pending  P: Pain control  Prn meds   Best Practice (right click and "Reselect all SmartList Selections" daily)   Diet/type: Regular consistency (see orders) DVT prophylaxis: prophylactic heparin  GI prophylaxis: PPI Lines: N/A Foley:  N/A Code  Status:  full code Last date of multidisciplinary goals of care discussion: Update patient daily   Labs   CBC: Recent Labs  Lab 03/08/22 0226 03/09/22 0945 03/11/22 0102 03/11/22 1612 03/11/22 2224 03/12/22 0420 03/12/22 0615  WBC 22.9* 29.0* 29.8*  --   --  26.7*  --   HGB 11.5* 11.7* 11.9* 11.2* 12.6 11.0* 11.9*  HCT 32.9* 33.7* 36.2 33.0*  37.0 30.1* 35.0*  MCV 93.5 93.4 98.4  --   --  91.2  --   PLT 402* 376 263  --   --  148*  --     Basic Metabolic Panel: Recent Labs  Lab 03/06/22 0244 03/08/22 0226 03/09/22 0945 03/11/22 0623 03/11/22 1612 03/11/22 1625 03/11/22 2224 03/12/22 0420 03/12/22 0615  NA 125* 123* 126* 123* 119* 122* 125* 131* 127*  K 3.8 3.3* 4.2 5.2* 3.9 3.7 3.7 4.0 4.1  CL 88* 85* 84* 84*  --  82*  --  89*  --   CO2 17* 19* 19* 8*  --  10*  --  13*  --   GLUCOSE 93 108* 85 89  --  161*  --  66*  --   BUN 74* 67* 75* 82*  --  79*  --  49*  --   CREATININE 7.81* 7.54* 7.95* 7.64*  --  7.58*  --  4.31*  --   CALCIUM 7.8* 7.8* 8.2* 7.8*  --  8.2*  --  8.2*  --   MG  --   --   --   --   --   --   --  2.1  --   PHOS 9.8*  --  9.8* 10.9*  --   --   --  7.2*  --    GFR: Estimated Creatinine Clearance: 16.5 mL/min (A) (by C-G formula based on SCr of 4.31 mg/dL (H)). Recent Labs  Lab 03/08/22 0226 03/09/22 0945 03/11/22 0623 03/11/22 1258 03/11/22 1625 03/11/22 1830 03/11/22 2224 03/12/22 0105 03/12/22 0420  WBC 22.9* 29.0* 29.8*  --   --   --   --   --  26.7*  LATICACIDVEN  --   --   --    < > >9.0* >9.0* >9.0* >9.0*  --    < > = values in this interval not displayed.    Liver Function Tests: Recent Labs  Lab 03/06/22 0244 03/09/22 0945 03/11/22 0623 03/11/22 1625 03/12/22 0420  AST  --   --   --  56*  --   ALT  --   --   --  32  --   ALKPHOS  --   --   --  150*  --   BILITOT  --   --   --  1.2  --   PROT  --   --   --  4.8*  --   ALBUMIN 1.5* <1.5* <1.5* 2.4* 2.2*   No results for input(s): "LIPASE", "AMYLASE" in the last 168 hours. No results for input(s): "AMMONIA" in the last 168 hours.  ABG    Component Value Date/Time   PHART 7.241 (L) 03/12/2022 0615   PCO2ART 27.9 (L) 03/12/2022 0615   PO2ART 103 03/12/2022 0615   HCO3 12.8 (L) 03/12/2022 0615   TCO2 14 (L) 03/12/2022 0615   ACIDBASEDEF 15.0 (H) 03/12/2022 0615   O2SAT 98 03/12/2022 0615     Coagulation  Profile: No results for input(s): "INR", "PROTIME" in the last 168 hours.  Cardiac Enzymes: Recent  Labs  Lab 03/11/22 1953  CKTOTAL 94    HbA1C: No results found for: "HGBA1C"  CBG: Recent Labs  Lab 03/12/22 0107 03/12/22 0418 03/12/22 0502 03/12/22 0612 03/12/22 0749  GLUCAP 74 63* 67* 118* 100*    Review of Systems:   Please see the history of present illness. All other systems reviewed and are negative   Past Medical History:  She,  has a past medical history of Abnormal bilirubin test, Cholelithiasis, Chronic kidney disease, stage 3b ([place]), Diastolic congestive heart failure ([place]), Esophagitis, Essential hypertension, Fibroids, Gastritis, GERD (gastroesophageal reflux disease), Gout, Liver hemangioma, Morbid obesity ([place]), Normocytic anemia, OSA (obstructive sleep apnea), Thyroid nodule, and Tubular adenoma.   Surgical History:   Past Surgical History:  Procedure Laterality Date   AV FISTULA PLACEMENT Left 08/19/2021   Procedure: LEFT ARM ARTERIOVENOUS (AV) FISTULA;  Surgeon: Serafina Mitchell, MD;  Location: [facility];  Service: Vascular;  Laterality: Left;   [place] Left 11/24/2021   Procedure: LEFT SECOND STAGE [place];  Surgeon: Serafina Mitchell, MD;  Location: [facility];  Service: Vascular;  Laterality: Left;   BIOPSY  08/03/2020   Procedure: BIOPSY;  Surgeon: Eloise Harman, DO;  [facility];  Service: Endoscopy;;  duodenal gastric   CESAREAN SECTION     COLONOSCOPY WITH PROPOFOL N/A 08/03/2020    Surgeon: Hurshel Keys K, DO; 5 mm tubular adenoma in the sigmoid colon, nonbleeding internal hemorrhoids.  Recommended repeat in 5 years.   ESOPHAGOGASTRODUODENOSCOPY (EGD) WITH PROPOFOL N/A 08/03/2020   Surgeon: Eloise Harman, DO; LA grade C esophagitis without bleeding, gastritis with erythema and shallow ulcerations in gastric antrum biopsied (negative for H. pylori), normal examined duodenum s/p biopsy  (benign).   HYSTERECTOMY ABDOMINAL WITH SALPINGECTOMY  2008   IR FLUORO GUIDE CV LINE RIGHT  08/15/2021   IR US GUIDE VASC ACCESS RIGHT  08/15/2021   POLYPECTOMY  08/03/2020   Procedure: POLYPECTOMY;  Surgeon: Eloise Harman, DO;  [facility];  Service: Endoscopy;;  colon   RIGHT HEART CATH N/A 07/26/2021   Procedure: RIGHT HEART CATH;  Surgeon: Larey Dresser, MD;  Location: [facility];  Service: Cardiovascular;  Laterality: N/A;   THYROIDECTOMY Right 10/21/2018   Procedure: RIGHT HEMITHYROIDECTOMY;  Surgeon: Leta Baptist, MD;  Location: [facility];  Service: ENT;  Laterality: Right;     Social History:   reports that she quit smoking about 6 years ago. Her smoking use included cigarettes. She has a 10.00 pack-year smoking history. She has never been exposed to tobacco smoke. She has never used smokeless tobacco. She reports that she does not currently use alcohol. She reports that she does not use drugs.   Family History:  Her family history includes Breast cancer in her mother; Colon cancer (age of onset: 41) in her father; Colon polyps (age of onset: 21) in her sister; Congestive Heart Failure in her maternal grandfather; Diabetes in her daughter; Diverticulitis in her brother and sister; Other in her maternal grandmother and paternal grandfather.   Allergies Allergies  Allergen Reactions   Oxycodone-Acetaminophen Hives and Other (See Comments)     Home Medications  Prior to Admission medications   Medication Sig Start Date End Date Taking? Authorizing Provider  acetaminophen (TYLENOL) 500 MG tablet Take 1,000 mg by mouth every 6 (six) hours as needed for moderate pain or headache.   Yes [provider]  allopurinol (ZYLOPRIM) 100 MG tablet Take 1  tablet (100 mg total) by mouth 3 (three) times a week. Patient taking differently: Take 100 mg by mouth every Monday, Wednesday, and Friday. 08/26/21  Yes Strader, [place] M, PA-C  doxycycline  (VIBRAMYCIN) 100 MG capsule Take 1 capsule (100 mg total) by mouth 2 (two) times daily. 02/21/22  Yes Pollina, Gwenyth Allegra, MD  furosemide (LASIX) 80 MG tablet Take 80 mg by mouth daily. 01/26/22  Yes [provider]  midodrine (PROAMATINE) 5 MG tablet TAKE 3 TABLETS BY MOUTH 3 TIMES A DAY WITH MEALS Patient taking differently: Take 15 mg by mouth 3 (three) times daily with meals. 02/15/22  Yes Satira Sark, MD  pantoprazole (PROTONIX) 40 MG tablet TAKE 1 TABLET BY MOUTH EVERY DAY 01/23/22  Yes Strader, [place] M, PA-C  TART CHERRY PO Take 1 tablet by mouth 3 (three) times a week. Tuesdays, Saturdays & Sundays in the morning.   Yes [provider]  linezolid (ZYVOX) 600 MG tablet Take 1 tablet (600 mg total) by mouth 2 (two) times daily for 10 days. 03/03/22 03/31/2022  Vu, Rockey Situ, MD  traMADol (ULTRAM) 50 MG tablet Take 1 tablet (50 mg total) by mouth every 6 (six) hours as needed. Patient not taking: Reported on 02/01/2022 11/24/21 11/24/22  [name], PA-C     This patient is critically ill with multiple organ system failure; which, requires frequent high complexity decision making, assessment, support, evaluation, and titration of therapies. This was completed through the application of advanced monitoring technologies and extensive interpretation of multiple databases. During this encounter critical care time was devoted to patient care services described in this note for 45 minutes.  Garner Nash, DO [facility] 03/12/2022 8:08 AM

## 2022-03-12 NOTE — Progress Notes (Signed)
Vaginal exam performed by myself and Andruw C, RN at request of Gerald Leitz, NP to check for potential retained tampon. No tampon or other object seen or felt on exam within vaginal canal.

## 2022-03-12 NOTE — Progress Notes (Signed)
PCCM Progress Note   Called to bedside for signs of continued decompensation. Per RN patients core temperature is now measuring at 88.18F with continued lactic acid of >9, mentation continues to decline, DIC panel is positive and she is now maxed on 4 pressors. Despite all aggressive measures available patients multisystem organ failure continues to progress. With this knowledge I contacted family to update them regarding her progressive decline and encouraged they present to the hospital.as patient is likely to decline further with possible impending cardiac arrest.   Chelcee Korpi D. Kenton Kingfisher, NP-C [facility] Personal contact information can be found on Amion  03/12/2022, 5:47 PM

## 2022-03-12 NOTE — Progress Notes (Signed)
Dr. Valeta Harms notified of low CBG, DIC panel results. Dr. Valeta Harms to come to bedside, asked for RN to run ABG in meantime.   Dr. Valeta Harms ordered 100 mEq sodium bicarbonate based on ABG results, ordered D20 drip, instructed RN to ask Dr. Posey Pronto with nephrology about increasing sodium bicarbonate via CRRT. Sodium bicarbonate rates pre and post filter increased per Dr. Posey Pronto. ABG to be repeated at approximately 1730 pm.

## 2022-03-12 NOTE — Progress Notes (Addendum)
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 I am not sure that this is the full cause of all her problems but definitely a possibility. Unfortunately, she is continuing to decline clinically.   Discussed with ID   Linezolid is a lipophilic drug. I discussed with pharmacy to give lipid emlusion therapy IV to see if we can reduce the circulating levels of linezolid if this is the cause. DIC panel labs also sent.   Continue supportive care. Remains on multiple pressors.   This patient is critically ill with multiple organ system failure; which, requires frequent high complexity decision making, assessment, support, evaluation, and titration of therapies. This was completed through the application of advanced monitoring technologies and extensive interpretation of multiple databases. During this encounter critical care time was devoted to patient care services described in this note for 55 minutes.  Garner Nash, DO [facility] 03/12/2022 2:31 PM

## 2022-03-12 NOTE — Progress Notes (Signed)
Secure chat with Dr, Posey Pronto, nephrology, states ok to place PICC line.

## 2022-03-12 NOTE — Progress Notes (Signed)
Patient still remains hypothermic despite warming blankets. Patient does not consistently want warming blankets on. Patient educated on her temperature and is agreeable to continue wearing warming blanket.   Patient CBGs have been trending down despite increasing D10 gtt. Patient given 8oz orange juice and peanut butter and graham crackers. 12.5 gm D50 given per hypoglycemia protocol. Elink RN made aware. Will continue to monitor CBGs.

## 2022-03-12 NOTE — Progress Notes (Signed)
Gerald Leitz, CCM NP to bedside at RN request due to decreasing blood pressure despite increasing epinephrine drip to maximum ordered rate. ABG repeated, 50 mEq of sodium bicarbonate ordered based on ABG results.   Patient's daughter updated via phone by Gerald Leitz, NP. Patient's sister Apolonio Schneiders updated by phone by this RN. Visitation restrictions lifted due to continued rapid decline in patient status

## 2022-03-12 NOTE — Progress Notes (Signed)
Patient ID: Lauren Osborn, female   DOB: 09-17-1975, 46 y.o.   MRN: [id number] [facility] Progress Note   Assessment/ Plan:   1.  Left leg cellulitis/sepsis: Failed outpatient antibiotic therapy with cefdinir/doxycycline and currently on linezolid and cefepime with evidence of streptococcal cellulitis/impetigo.  With improving cellulitis of lower extremity/abdominal pannus but some associated mottling noted.  Transfer to ICU yesterday with worsening hypotension/shock and abdominal imaging did not show bowel ischemia or acute intra-abdominal process; showed cirrhosis with anasarca/volume overload.  Continue CRRT with efforts at ultrafiltration and managing metabolic acidosis that appears to be largely from lactic acidosis (combination of hypoxic from hypoperfusion and nonhypoxic from hepatic/renal dysfunction). 2. ESRD: No evidence of peritonitis based on PD fluid cell count (cultures pending).  Yesterday transferred to ICU for initiation of CRRT after peritoneal dialysis limited in ultrafiltration/clearance.  Currently tolerating CRRT that was transiently stopped because of critical hypotension. 3. Anemia: Hemoglobin and hematocrit currently at goal, will continue to monitor for ESA indications. 4. CKD-MBD: Uptitrated sevelamer recently because of hyperphosphatemia, will trend Ca/P and monitor with CRRT. 5. Nutrition: Continue ongoing nutritional protein supplementation in the setting of infection/PD. 6. Hypotension: Transferred to the ICU yesterday with arterial lines confirming peripheral pressures with critical hypotension.  She is on high-dose pressors and with possible septic versus distributive shock.  Subjective:   Complains of pressure within her lower abdomen/pelvis.  Abdominal imaging yesterday showed no acute intra-abdominal process including ischemia.  With increasing pressor requirement overnight   Objective:   BP (!) 38/26   Pulse (!) 127   Temp (!) 90 F (32.2 C)  (Oral)   Resp 13   Ht '5\' 3"'$  (1.6 m)   Wt 82.8 kg   SpO2 (!) 61%   BMI 32.34 kg/m   Physical Exam: Gen: Comfortably sitting up in bed eating breakfast, awakens to conversation and remains oriented 3 CVS: Pulse regular rhythm, normal rate, S1 and S2 normal Resp: Clear to auscultation bilaterally, no rales/rhonchi Abd: Soft, obese, PD catheter in place, pannus with evidence of mottling/cellulitis and small ulceration right lower quadrant Ext: 1+-2+ edema in the lower extremities with left leg in Ace wrap and mottling to the.  Hemangioma lateral aspect of left leg.  Left upper arm AVF thrombosed  Labs: BMET Recent Labs  Lab 03/06/22 0244 03/08/22 0226 03/09/22 0945 03/11/22 1497 03/11/22 1612 03/11/22 1625 03/11/22 2224 03/12/22 0420 03/12/22 0615  NA 125* 123* 126* 123* 119* 122* 125* 131* 127*  K 3.8 3.3* 4.2 5.2* 3.9 3.7 3.7 4.0 4.1  CL 88* 85* 84* 84*  --  82*  --  89*  --   CO2 17* 19* 19* 8*  --  10*  --  13*  --   GLUCOSE 93 108* 85 89  --  161*  --  66*  --   BUN 74* 67* 75* 82*  --  79*  --  49*  --   CREATININE 7.81* 7.54* 7.95* 7.64*  --  7.58*  --  4.31*  --   CALCIUM 7.8* 7.8* 8.2* 7.8*  --  8.2*  --  8.2*  --   PHOS 9.8*  --  9.8* 10.9*  --   --   --  7.2*  --    CBC Recent Labs  Lab 03/08/22 0226 03/09/22 0945 03/11/22 0623 03/11/22 1612 03/11/22 2224 03/12/22 0420 03/12/22 0615  WBC 22.9* 29.0* 29.8*  --   --  26.7*  --   HGB 11.5* 11.7* 11.9*  11.2* 12.6 11.0* 11.9*  HCT 32.9* 33.7* 36.2 33.0* 37.0 30.1* 35.0*  MCV 93.5 93.4 98.4  --   --  91.2  --   PLT 402* 376 263  --   --  148*  --      Medications:     Chlorhexidine Gluconate Cloth  6 each Topical Q0600   gabapentin  100 mg Oral QHS   gentamicin cream  1 Application Topical Daily   heparin  5,000 Units Subcutaneous Q8H   lidocaine  2 patch Transdermal Q24H   linezolid  600 mg Oral Q12H   methylPREDNISolone (SOLU-MEDROL) injection  80 mg Intravenous Q12H   metoCLOPramide (REGLAN)  injection  5 mg Intravenous Q6H   midodrine  20 mg Oral TID WC   nystatin  5 mL Oral TID   pantoprazole  40 mg Oral Daily   sevelamer carbonate  2,400 mg Oral TID WC   sodium bicarbonate  1,300 mg Oral BID   sodium chloride flush  10-40 mL Intracatheter Q12H   Elmarie Shiley, MD 03/12/2022, 8:03 AM

## 2022-03-12 NOTE — Progress Notes (Signed)
Unable to get axillary or oral temperature reading. Bair Hugger actively running at [employer].

## 2022-03-12 NOTE — Procedures (Signed)
Arterial Catheter Insertion Procedure Note  Lauren Osborn  [id number]  1976/02/18  Date:03/12/22  Time:12:09 AM    Provider Performing: Audria Nine    Procedure: Insertion of Arterial Line [phone]) with US guidance ([id number])   Indication(s) Blood pressure monitoring and/or need for frequent ABGs  Consent Risks of the procedure as well as the alternatives and risks of each were explained to the patient and/or caregiver.  Consent for the procedure was obtained and is signed in the bedside chart  Anesthesia None   Time Out Verified patient identification, verified procedure, site/side was marked, verified correct patient position, special equipment/implants available, medications/allergies/relevant history reviewed, required imaging and test results available.   Sterile Technique Maximal sterile technique including full sterile barrier drape, hand hygiene, sterile gown, sterile gloves, mask, hair covering, sterile ultrasound probe cover (if used).   Procedure Description Area of catheter insertion was cleaned with chlorhexidine and draped in sterile fashion. With real-time ultrasound guidance an arterial catheter was placed into the right femoral artery.  Appropriate arterial tracings confirmed on monitor.     Complications/Tolerance None; patient tolerated the procedure well.   EBL Minimal   Specimen(s) None  Completed 2/2 previous concern for subclavian stenosis on R altering BP readings on arterial line. Pt has AVF on L with palpable thrill. Would have attemped line on L fem but extensive cellulitis on L made this less ideal.

## 2022-03-12 NOTE — IPAL (Signed)
  Interdisciplinary Goals of Care Family Meeting   Date carried out:: 03/12/2022  Location of the meeting: Bedside  Member's involved: Nurse Practitioner, Bedside Registered Nurse, and Family Member or next of kin  Durable Power of Attorney or acting medical decision maker: Daughter Arby Barrette hold power of attorney but decision shared among all family  Discussion: On family arrival extensive update including progressive multisystem organ failure was provided to family.  And decision was made to proceed with change CODE STATUS to DO NOT RESUSCITATE.  At this time we will continue current aggressive measures with understanding patient will likely code despite interventions  Code status: Full DNR  Disposition: Continue current acute care   Time spent for the meeting: 40 mins   Lauren Economou D. Kenton Kingfisher, NP-C [facility] Personal contact information can be found on Amion  03/12/2022, 7:20 PM

## 2022-03-13 ENCOUNTER — Other Ambulatory Visit (HOSPITAL_COMMUNITY): Payer: Self-pay

## 2022-03-13 ENCOUNTER — Other Ambulatory Visit (HOSPITAL_COMMUNITY): Payer: Medicaid Other

## 2022-03-13 DIAGNOSIS — R6521 Severe sepsis with septic shock: Secondary | ICD-10-CM

## 2022-03-13 DIAGNOSIS — N186 End stage renal disease: Secondary | ICD-10-CM | POA: Diagnosis not present

## 2022-03-13 DIAGNOSIS — A419 Sepsis, unspecified organism: Secondary | ICD-10-CM | POA: Diagnosis not present

## 2022-03-13 DIAGNOSIS — Z992 Dependence on renal dialysis: Secondary | ICD-10-CM | POA: Diagnosis not present

## 2022-03-13 LAB — RENAL FUNCTION PANEL
Albumin: <1.5 g/dL — ABNORMAL LOW (ref 3.5–5.0)
Anion gap: 32 — ABNORMAL HIGH (ref 5–15)
BUN: 22 mg/dL — ABNORMAL HIGH (ref 6–20)
CO2: 17 mmol/L — ABNORMAL LOW (ref 22–32)
Calcium: 6.5 mg/dL — ABNORMAL LOW (ref 8.9–10.3)
Chloride: 81 mmol/L — ABNORMAL LOW (ref 98–111)
Creatinine, Ser: 2.2 mg/dL — ABNORMAL HIGH (ref 0.44–1.00)
GFR, Estimated: 27 mL/min — ABNORMAL LOW (ref >60)
Glucose, Bld: 117 mg/dL — ABNORMAL HIGH (ref 70–99)
Phosphorus: 5.6 mg/dL — ABNORMAL HIGH (ref 2.5–4.6)
Potassium: 3.9 mmol/L (ref 3.5–5.1)
Sodium: 130 mmol/L — ABNORMAL LOW (ref 135–145)

## 2022-03-13 LAB — APTT: aPTT: 190 seconds (ref 24–36)

## 2022-03-13 LAB — GLUCOSE, CAPILLARY
Glucose-Capillary: 130 mg/dL — ABNORMAL HIGH (ref 70–99)
Glucose-Capillary: 131 mg/dL — ABNORMAL HIGH (ref 70–99)
Glucose-Capillary: 71 mg/dL (ref 70–99)

## 2022-03-13 LAB — T3, FREE: T3, Free: 0.8 pg/mL — ABNORMAL LOW (ref 2.0–4.4)

## 2022-03-13 LAB — MAGNESIUM: Magnesium: 1.9 mg/dL (ref 1.7–2.4)

## 2022-03-13 MED ORDER — FENTANYL 2500MCG IN NS 250ML (10MCG/ML) PREMIX INFUSION
0.0000 ug/h | INTRAVENOUS | Status: DC
  Administered 2022-03-13: 25 ug/h via INTRAVENOUS
  Filled 2022-03-13: qty 250

## 2022-03-13 MED ORDER — FENTANYL CITRATE (PF) 100 MCG/2ML IJ SOLN
25.0000 ug | INTRAMUSCULAR | Status: DC | PRN
  Administered 2022-03-13 (×2): 25 ug via INTRAVENOUS
  Filled 2022-03-13 (×2): qty 2

## 2022-03-13 MED ORDER — GLYCOPYRROLATE 1 MG PO TABS: 1.0000 mg | ORAL_TABLET | ORAL | Status: DC | PRN

## 2022-03-13 MED ORDER — PHENYLEPHRINE HCL-NACL 20-0.9 MG/250ML-% IV SOLN
INTRAVENOUS | Status: AC
  Filled 2022-03-13: qty 250

## 2022-03-13 MED ORDER — DEXTROSE 50 % IV SOLN
INTRAVENOUS | Status: AC
  Administered 2022-03-13: 25 mL
  Filled 2022-03-13: qty 50

## 2022-03-13 MED ORDER — GLYCOPYRROLATE 0.2 MG/ML IJ SOLN: 0.2000 mg | INTRAMUSCULAR | Status: DC | PRN

## 2022-03-13 MED ORDER — POLYVINYL ALCOHOL 1.4 % OP SOLN: 1.0000 [drp] | Freq: Four times a day (QID) | OPHTHALMIC | Status: DC | PRN

## 2022-03-13 MED ORDER — MIDAZOLAM HCL 2 MG/2ML IJ SOLN: 2.0000 mg | INTRAMUSCULAR | Status: DC | PRN

## 2022-03-17 LAB — CULTURE, BLOOD (ROUTINE X 2)
Culture: NO GROWTH
Culture: NO GROWTH
Special Requests: ADEQUATE

## 2022-04-04 NOTE — Progress Notes (Signed)
NAME:  Lauren Osborn, MRN:  [id number], DOB:  1976-08-30, LOS: 56 ADMISSION DATE:  02/25/2022, CONSULTATION DATE:  03/11/2022 REFERRING MD:  Dr. Darrick Meigs - [facility], CHIEF COMPLAINT:  Septic shock with need of CRRT    History of Present Illness:  Lauren Osborn is a 46yo female with a PMH significant for ESRD on peritoneal dialysis, OSA, Diastolic congestive heart failure, HTN, Liver hemangioma, and GERD who presented to the ED 02/09/2022  for complaints of left leg pain. She was evaluated and diagnosis with cellulitis on admit. Of note she failed outpatient antibiotic therapy.   Since admission she has been treated with IV antibiotics for management of cellulitis with minimal improvement in pain and eurhythmia. AM of 7/8 patient was evaluated by Nephrology and given persistent erythema with leukocytosis, hypotension, worsening metabolic acidosis, and rising potasium decision was made to consult PCCM and transfer patient to ICU for initiation of CRRT.   On arrival to ICU lactic acid >9, BP systolic 96G. Art line and HD cath placed. Started on CRRT. Requiring EPI, Vaso, NEO, and Levophed at max doses. No improvement in metabolic state. CT A/P with ground glass opacities and trace bilateral pleural effusions, moderate volume ascites, cholelithiasis without evidence of acute cholecystitis, nodular contour of liver suggestive of cirrhosis. ID consulted due to ongoing shock, extreme lactic acidosis, and clear cultures. Linezolid stopped. Started on Mica, Flagyl, Dapto, and Cefepime. DIC panel with PLTs 100, Fib 201, D-Dimer 9.29, appt >200, INR 2.7. PT 28.8. Diffuse rash extending from stomach to ankles. Surgery consulted 7/9. Does not believe to be necrotizing soft tissue infection. No surgical indications at this time. ECHO with EF 60-65, no valvular abnormalities.   Pertinent  Medical History  ESRD on peritoneal dialysis, OSA, Diastolic congestive heart failure, HTN, Liver hemangioma, and GERD  Significant Hospital  Events: Including procedures, antibiotic start and stop dates in addition to other pertinent events   6/26 admitted for cellulitis  7/8 transferred to ICU for initiation of CRRT   Interim History / Subjective:  7/10 with worsening mentation and hypotension. Family at bedside. Understands severity of condition.   Objective   Blood pressure (!) 38/26, pulse (!) 240, temperature (!) 90.1 F (32.3 C), resp. rate (!) 29, height '5\' 3"'$  (1.6 m), weight 88.9 kg, SpO2 (!) 69 %.        Intake/Output Summary (Last 24 hours) at 04/06/2022 0745 Last data filed at 04/06/22 0700 Gross per 24 hour  Intake 6151.41 ml  Output 1865.9 ml  Net 4285.51 ml   Filed Weights   03/09/22 0500 03/12/22 0451 2022-04-06 0403  Weight: 80.8 kg 82.8 kg 88.9 kg    Examination: General: critically ill adult female  HEENT: dry MM  Neuro: lethargic, does not follow commands, does not track, moans  CV: RRR, s1 s2  PULM:  coarse breath sounds, no use of accessory muscles   GI: tender, thickened skin, redness on abdominal panniculus  Extremities: LLE in bandage  Skin: mottling BL LE extremities, extending blisters and purple rash noted on thighs and [facility] Problem list     Assessment & Plan:   Sepsis due cellulitis  -Concern for vascular compromise as leading cause to continued hypotension. She is fully alert and oriented and asymptomatic with documented profound hypotension   -ID evaluated and signed off 6/30, recommenced continued Linezolid x10 days  Shock state with progressive hypotension, unclear etiology, suspect infectious in nature  Panniculitis - progressive abdominal wall, hemorrhagic bulla and  mottling, ulceration on the right abdominal wall ESRD Lactic Acidosis  Severe Anion Gap acidosis  Hyponatremia  P: - Currently on max dosage of 4 vasopressors with continued hypotension, patient now with agonal breathing with peroids of apnea. Family at bedside, decision made to transition  to comfort measures. Plans to continue vasopressor support until additional family arrive.  - Continue Fentanyl gtt. PRN robinul and versed ordered   Labs   CBC: Recent Labs  Lab 03/08/22 0226 03/09/22 0945 03/11/22 5284 03/11/22 1612 03/12/22 0420 03/12/22 0615 03/12/22 1224 03/12/22 1358 03/12/22 1457 03/12/22 1522 03/12/22 1717  WBC 22.9* 29.0* 29.8*  --  26.7*  --   --   --  25.3*  --   --   HGB 11.5* 11.7* 11.9*   < > 11.0* 11.9* 12.2  --  10.2* 10.9* 11.2*  HCT 32.9* 33.7* 36.2   < > 30.1* 35.0* 36.0  --  29.2* 32.0* 33.0*  MCV 93.5 93.4 98.4  --  91.2  --   --   --  93.9  --   --   PLT 402* 376 263  --  148*  --   --  100* 102*  --   --    < > = values in this interval not displayed.    Basic Metabolic Panel: Recent Labs  Lab 03/09/22 0945 03/11/22 0623 03/11/22 1612 03/11/22 1625 03/11/22 2224 03/12/22 0420 03/12/22 0615 03/12/22 1224 03/12/22 1522 03/12/22 1711 03/12/22 1717 2022/03/29 0358  NA 126* 123*   < > 122*   < > 131*   < > 125* 123* 130* 126* 130*  K 4.2 5.2*   < > 3.7   < > 4.0   < > 4.8 4.1 5.2* 4.2 3.9  CL 84* 84*  --  82*  --  89*  --   --   --  83*  --  81*  CO2 19* 8*  --  10*  --  13*  --   --   --  14*  --  17*  GLUCOSE 85 89  --  161*  --  66*  --   --   --  64*  --  117*  BUN 75* 82*  --  79*  --  49*  --   --   --  32*  --  22*  CREATININE 7.95* 7.64*  --  7.58*  --  4.31*  --   --   --  2.63*  --  2.20*  CALCIUM 8.2* 7.8*  --  8.2*  --  8.2*  --   --   --  7.2*  --  6.5*  MG  --   --   --   --   --  2.1  --   --   --   --   --  1.9  PHOS 9.8* 10.9*  --   --   --  7.2*  --   --   --  3.4  --  5.6*   < > = values in this interval not displayed.   GFR: Estimated Creatinine Clearance: 33.4 mL/min (A) (by C-G formula based on SCr of 2.2 mg/dL (H)). Recent Labs  Lab 03/09/22 0945 03/11/22 0623 03/11/22 1258 03/11/22 1830 03/11/22 2224 03/12/22 0105 03/12/22 0420 03/12/22 1457 03/12/22 1700  WBC 29.0* 29.8*  --   --   --   --   26.7* 25.3*  --   LATICACIDVEN  --   --    < > >  9.0* >9.0* >9.0*  --   --  >9.0*   < > = values in this interval not displayed.    Liver Function Tests: Recent Labs  Lab 03/11/22 0623 03/11/22 1625 03/12/22 0420 03/12/22 1711 03-30-2022 0358  AST  --  56*  --   --   --   ALT  --  32  --   --   --   ALKPHOS  --  150*  --   --   --   BILITOT  --  1.2  --   --   --   PROT  --  4.8*  --   --   --   ALBUMIN <1.5* 2.4* 2.2* 1.8* <1.5*   No results for input(s): "LIPASE", "AMYLASE" in the last 168 hours. No results for input(s): "AMMONIA" in the last 168 hours.  ABG    Component Value Date/Time   PHART 7.307 (L) 03/12/2022 1717   PCO2ART 33.2 03/12/2022 1717   PO2ART 77 (L) 03/12/2022 1717   HCO3 17.8 (L) 03/12/2022 1717   TCO2 19 (L) 03/12/2022 1717   ACIDBASEDEF 9.0 (H) 03/12/2022 1717   O2SAT 97 03/12/2022 1717     Coagulation Profile: Recent Labs  Lab 03/12/22 1358  INR 2.7*    Cardiac Enzymes: Recent Labs  Lab 03/11/22 1953  CKTOTAL 94    HbA1C: No results found for: "HGBA1C"  CBG: Recent Labs  Lab 03/12/22 1841 03/12/22 2026 03/12/22 2202 03/12/22 2359 March 30, 2022 0356  GLUCAP 213* 97 81 71 131*    Review of Systems:   Please see the history of present illness. All other systems reviewed and are negative   Past Medical History:  She,  has a past medical history of Abnormal bilirubin test, Cholelithiasis, Chronic kidney disease, stage 3b ([place]), Diastolic congestive heart failure ([place]), Esophagitis, Essential hypertension, Fibroids, Gastritis, GERD (gastroesophageal reflux disease), Gout, Liver hemangioma, Morbid obesity ([place]), Normocytic anemia, OSA (obstructive sleep apnea), Thyroid nodule, and Tubular adenoma.    This patient is critically ill with multiple organ system failure; which, requires frequent high complexity decision making, assessment, support, evaluation, and titration of therapies. This was completed through the application of advanced  monitoring technologies and extensive interpretation of multiple databases. During this encounter critical care time was devoted to patient care services described in this note for 42 minutes.  Hayden Pedro, AGACNP-BC [facility]  PCCM Pgr: [phone]

## 2022-04-04 NOTE — Progress Notes (Signed)
eLink Physician-Brief Progress Note Patient Name: Lauren Osborn DOB: [birth year] MRN: [id number]   Date of Service  04-12-2022  HPI/Events of Note  Notified of hypoglycemia with glucose now at 71 despite being on D20% at 50cc/hr.   eICU Interventions  Increase D20 to 75cc/hr.       Intervention Category Intermediate Interventions: Other:  Elsie Lincoln 04/12/2022, 12:36 AM

## 2022-04-04 NOTE — Death Summary Note (Signed)
DEATH SUMMARY   Patient Details  Name: Lauren Osborn MRN: [id number] DOB: June 12, 1976  Admission/Discharge Information   Admit Date:  03-18-2022  Date of Death: Date of Death: April 01, 2022  Time of Death: Time of Death: 12/05/51  Length of Stay: December 04, 2022  Referring Physician: Carrolyn Meiers, MD   Reason(s) for Hospitalization  Sepsis, Cellulitis   Diagnoses  Preliminary cause of death:  Secondary Diagnoses (including complications and co-morbidities):  Principal Problem:   Severe sepsis with septic shock ([place]) Active Problems:   GERD (gastroesophageal reflux disease)   Hypotension   ESRD (end stage renal disease) ([place])   Cellulitis   Cellulitis of left leg   Lactic acidosis   [facility])   Brief Hospital Course (including significant findings, care, treatment, and services provided and events leading to death)  Lauren Osborn is a 46yo female with a PMH significant for ESRD on peritoneal dialysis, OSA, Diastolic congestive heart failure, HTN, Liver hemangioma, and GERD who presented to the ED March 18, 2022  for complaints of left leg pain. She was evaluated and diagnosis with cellulitis on admit. Of note she failed outpatient antibiotic therapy.    Since admission she has been treated with IV antibiotics for management of cellulitis with minimal improvement in pain and eurhythmia. AM of 7/8 patient was evaluated by Nephrology and given persistent erythema with leukocytosis, hypotension, worsening metabolic acidosis, and rising potasium decision was made to consult PCCM and transfer patient to ICU for initiation of CRRT.    On arrival to ICU lactic acid >9, BP systolic 97Q. Art line and HD cath placed. Started on CRRT. Requiring EPI, Vaso, NEO, and Levophed at max doses. No improvement in metabolic state. CT A/P with ground glass opacities and trace bilateral pleural effusions, moderate volume ascites, cholelithiasis without evidence of acute cholecystitis, nodular contour of liver  suggestive of cirrhosis. ID consulted due to ongoing shock, extreme lactic acidosis, and clear cultures. Linezolid stopped. Started on Mica, Flagyl, Dapto, and Cefepime. DIC panel with PLTs 100, Fib 201, D-Dimer 9.29, appt >200, INR 2.7. PT 28.8. Diffuse rash extending from stomach to ankles. Surgery consulted 7/9. Does not believe to be necrotizing soft tissue infection. No surgical indications at this time. ECHO with EF 60-65, no valvular abnormalities. 7/10 with worsening mentation and hypotension. Family at bedside. Understands severity of condition, decision made to transition to comfort care. Patient expired 7/10 at 1253.    Pertinent Labs and Studies  Significant Diagnostic Studies ECHOCARDIOGRAM COMPLETE  Result Date: 03/12/2022    ECHOCARDIOGRAM REPORT   Patient Name:   MAGDALINE Osborn Date of Exam: 03/12/2022 Medical Rec #:  [id number]      Height:       63.0 in Accession #:    [id number]     Weight:       182.5 lb Date of Birth:  October 22, 1975      BSA:          1.860 m Patient Age:    10 years       BP:           103/47 mmHg Patient Gender: F              HR:           98 bpm. Exam Location:  Inpatient Procedure: 2D Echo, Color Doppler and Cardiac Doppler Indications:    Dyspnea R06.00  History:        Patient has prior history of Echocardiogram examinations, most  recent 07/23/2021. Risk Factors:Hypertension and Sleep Apnea.                 Sepsis, Lower extremity cellulitis. Chronic kidney disease.  Sonographer:    Darlina Sicilian RDCS Referring Phys: Adrian Saran HARRIS  Sonographer Comments: Suboptimal subcostal window due to progressive abdominal wall changes.  IMPRESSIONS  1. Left ventricular ejection fraction, by estimation, is 60 to 65%. The left ventricle has normal function. The left ventricle has no regional wall motion abnormalities. Left ventricular diastolic parameters were normal.  2. Right ventricular systolic function is normal. The right ventricular size is normal. There is  normal pulmonary artery systolic pressure. The estimated right ventricular systolic pressure is 16.1 mmHg.  3. The mitral valve is grossly normal. No evidence of mitral valve regurgitation. No evidence of mitral stenosis.  4. The aortic valve is tricuspid. Aortic valve regurgitation is not visualized. No aortic stenosis is present.  5. The inferior vena cava is normal in size with greater than 50% respiratory variability, suggesting right atrial pressure of 3 mmHg. Conclusion(s)/Recommendation(s): Normal biventricular function without evidence of hemodynamically significant valvular heart disease. FINDINGS  Left Ventricle: Left ventricular ejection fraction, by estimation, is 60 to 65%. The left ventricle has normal function. The left ventricle has no regional wall motion abnormalities. The left ventricular internal cavity size was normal in size. There is  no left ventricular hypertrophy. Left ventricular diastolic parameters were normal. Right Ventricle: The right ventricular size is normal. No increase in right ventricular wall thickness. Right ventricular systolic function is normal. There is normal pulmonary artery systolic pressure. The tricuspid regurgitant velocity is 2.51 m/s, and  with an assumed right atrial pressure of 3 mmHg, the estimated right ventricular systolic pressure is 09.6 mmHg. Left Atrium: Left atrial size was normal in size. Right Atrium: Right atrial size was normal in size. Pericardium: There is no evidence of pericardial effusion. Mitral Valve: The mitral valve is grossly normal. No evidence of mitral valve regurgitation. No evidence of mitral valve stenosis. Tricuspid Valve: The tricuspid valve is grossly normal. Tricuspid valve regurgitation is mild . No evidence of tricuspid stenosis. Aortic Valve: The aortic valve is tricuspid. Aortic valve regurgitation is not visualized. No aortic stenosis is present. Pulmonic Valve: The pulmonic valve was grossly normal. Pulmonic valve  regurgitation is not visualized. No evidence of pulmonic stenosis. Aorta: The aortic root and ascending aorta are structurally normal, with no evidence of dilitation. Venous: The inferior vena cava is normal in size with greater than 50% respiratory variability, suggesting right atrial pressure of 3 mmHg. IAS/Shunts: The atrial septum is grossly normal.  LEFT VENTRICLE PLAX 2D LVIDd:         3.50 cm   Diastology LVIDs:         2.40 cm   LV e' medial:    10.90 cm/s LV PW:         0.90 cm   LV E/e' medial:  5.3 LV IVS:        0.90 cm   LV e' lateral:   9.46 cm/s LVOT diam:     1.90 cm   LV E/e' lateral: 6.1 LV SV:         36 LV SV Index:   20 LVOT Area:     2.84 cm  RIGHT VENTRICLE RV S prime:     13.30 cm/s TAPSE (M-mode): 1.5 cm LEFT ATRIUM             Index LA diam:  3.40 cm 1.83 cm/m LA Vol (A2C):   31.5 ml 16.94 ml/m LA Vol (A4C):   52.6 ml 28.28 ml/m LA Biplane Vol: 46.8 ml 25.16 ml/m  AORTIC VALVE LVOT Vmax:   83.50 cm/s LVOT Vmean:  52.100 cm/s LVOT VTI:    0.128 m  AORTA Ao Root diam: 2.90 cm Ao Asc diam:  3.20 cm MITRAL VALVE               TRICUSPID VALVE MV Area (PHT): 3.42 cm    TR Peak grad:   25.2 mmHg MV Decel Time: 222 msec    TR Vmax:        251.00 cm/s MV E velocity: 58.10 cm/s MV A velocity: 40.90 cm/s  SHUNTS MV E/A ratio:  1.42        Systemic VTI:  0.13 m                            Systemic Diam: 1.90 cm Eleonore Chiquito MD Electronically signed by Eleonore Chiquito MD Signature Date/Time: 03/12/2022/12:32:40 PM    Final    [place] EKG SITE RITE  Result Date: 03/12/2022 If Site Rite image not attached, placement could not be confirmed due to current cardiac rhythm.  CT ABDOMEN PELVIS WO CONTRAST  Result Date: 03/11/2022 CLINICAL DATA:  Sepsis, source EXAM: CT ABDOMEN AND PELVIS WITHOUT CONTRAST TECHNIQUE: Multidetector CT imaging of the abdomen and pelvis was performed following the standard protocol without IV contrast. RADIATION DOSE REDUCTION: This exam was performed according to the  departmental dose-optimization program which includes automated exposure control, adjustment of the mA and/or kV according to patient size and/or use of iterative reconstruction technique. COMPARISON:  MR abdomen, 12/16/2020 FINDINGS: Lower chest: Mild, diffuse ground-glass opacity bilateral lung bases and trace bilateral pleural effusions. Trace pericardial effusion Hepatobiliary: Somewhat coarse, nodular contour of the liver subtle lesion of the central liver dome, previously characterized by MR as a benign hemangioma (series 3, image 9). Large rim calcified gallstone. No gallbladder wall thickening, or biliary dilatation. Pancreas: Unremarkable. No pancreatic ductal dilatation or surrounding inflammatory changes. Spleen: Normal in size without significant abnormality. Adrenals/Urinary Tract: Adrenal glands are unremarkable. Kidneys are normal, without renal calculi, solid lesion, or hydronephrosis. Bladder is unremarkable. Stomach/Bowel: Stomach is within normal limits. Appendix appears normal. No evidence of bowel wall thickening, distention, or inflammatory changes. Vascular/Lymphatic: Scattered aortic atherosclerosis. No enlarged abdominal or pelvic lymph nodes. Reproductive: No mass or other significant abnormality. Other: Severe anasarca. Tunneled Tenckhoff type peritoneal dialysis catheter tip in the mid pelvis (series 3, image 24). Moderate volume ascites throughout the abdomen and pelvis. Musculoskeletal: No acute or significant osseous findings. IMPRESSION: 1. Mild, diffuse ground-glass opacity bilateral lung bases and trace bilateral pleural effusions, consistent with pulmonary edema. 2. Moderate volume ascites throughout the abdomen and pelvis, presumably peritoneal dialysate. 3. Peritoneal dialysis catheter, tip in the mid pelvis. 4. Cholelithiasis without evidence of acute cholecystitis. 5. Somewhat coarse, nodular contour of the liver, suggestive of cirrhosis. 6. Severe anasarca. Aortic  Atherosclerosis (ICD10-I70.0). Electronically Signed   By: Delanna Ahmadi M.D.   On: 03/11/2022 19:19   DG CHEST PORT 1 VIEW  Result Date: 03/11/2022 CLINICAL DATA:  Central line placement. EXAM: PORTABLE CHEST 1 VIEW COMPARISON:  Chest x-ray 02/18/2022 FINDINGS: Right-sided central venous catheter tip projects over the mid SVC. The heart size and mediastinal contours are within normal limits. Both lungs are clear. The visualized skeletal structures are unremarkable. IMPRESSION: 1. Right-sided  central venous catheter tip projects over the mid SVC. 2. No acute cardiopulmonary process. Electronically Signed   By: Ronney Asters M.D.   On: 03/11/2022 16:25   CT HEAD WO CONTRAST (5MM)  Result Date: 03/08/2022 CLINICAL DATA:  Dizziness EXAM: CT HEAD WITHOUT CONTRAST TECHNIQUE: Contiguous axial images were obtained from the base of the skull through the vertex without intravenous contrast. RADIATION DOSE REDUCTION: This exam was performed according to the departmental dose-optimization program which includes automated exposure control, adjustment of the mA and/or kV according to patient size and/or use of iterative reconstruction technique. COMPARISON:  None Available. FINDINGS: Brain: No evidence of acute infarction, hemorrhage, hydrocephalus, extra-axial collection or mass lesion/mass effect. Choroidal fissure cyst on the right. Vascular: Negative for hyperdense vessel Skull: Negative Sinuses/Orbits: Paranasal sinuses clear.  Negative orbit Other: None IMPRESSION: Negative CT head Electronically Signed   By: Franchot Gallo M.D.   On: 03/08/2022 13:24   DG HIP UNILAT WITH PELVIS 2-3 VIEWS RIGHT  Result Date: 03/05/2022 CLINICAL DATA:  Fall EXAM: DG HIP (WITH OR WITHOUT PELVIS) 2-3V RIGHT COMPARISON:  None FINDINGS: Osseous demineralization. Hip and SI joint spaces preserved. No acute fracture, dislocation, or bone destruction. Degenerative disc and facet disease changes at lower lumbar spine IMPRESSION: No acute  osseous abnormalities. Electronically Signed   By: Lavonia Dana M.D.   On: 03/05/2022 12:17   DG Tibia/Fibula Left  Result Date: 02/23/2022 CLINICAL DATA:  Cellulitis EXAM: LEFT TIBIA AND FIBULA - 2 VIEW COMPARISON:  None Available. FINDINGS: Normal alignment. No acute fracture or dislocation. No osseous erosion or abnormal periosteal reaction. Degenerative changes are noted within the knee. There is extensive subcutaneous edema of the visualized left lower extremity. No subcutaneous gas identified. IMPRESSION: Extensive subcutaneous edema. No subcutaneous gas identified. No radiographic evidence of osteomyelitis. Electronically Signed   By: Fidela Salisbury M.D.   On: 03/02/2022 21:53   DG Chest 2 View  Result Date: 02/17/2022 CLINICAL DATA:  Bilateral lower extremity cellulitis.  Hypotensive. EXAM: CHEST - 2 VIEW COMPARISON:  None Available. FINDINGS: The heart size and mediastinal contours are within normal limits. Both lungs are clear. The visualized skeletal structures are unremarkable. IMPRESSION: No active cardiopulmonary disease. Electronically Signed   By: Ronney Asters M.D.   On: 02/15/2022 18:59   MM DIAG BREAST TOMO BILATERAL  Result Date: 02/23/2022 CLINICAL DATA:  Follow-up for probably benign fat necrosis in the RIGHT breast. History of CHF and end-stage renal disease. EXAM: DIGITAL DIAGNOSTIC BILATERAL MAMMOGRAM WITH TOMOSYNTHESIS AND CAD TECHNIQUE: Bilateral digital diagnostic mammography and breast tomosynthesis was performed. The images were evaluated with computer-aided detection. COMPARISON:  Previous exams including diagnostic mammogram and ultrasound dated 11/25/2019 and most recent RIGHT breast ultrasound dated 02/17/2020. ACR Breast Density Category b: There are scattered areas of fibroglandular density. FINDINGS: At the site of the previously described probably benign mass in the upper-outer quadrant of the RIGHT breast, there are now findings compatible with benign fat necrosis  with associated benign dystrophic calcifications. There are no new dominant masses, suspicious calcifications or secondary signs of malignancy identified within either breast. There is skin thickening and trabecular prominence throughout both breasts, compatible with a combination of interval weight loss and patient's history of CHF/end-stage renal disease. IMPRESSION: 1. No evidence of malignancy within either breast. 2. Sequela of benign fat necrosis within the upper-outer quadrant of the RIGHT breast, with associated benign dystrophic calcifications. No additional follow-up diagnostic imaging is necessary for this benign finding. 3. Diffuse skin thickening and  trabecular prominence throughout both breasts, compatible with a combination of interval weight loss and patient's history of CHF/end-stage renal disease. RECOMMENDATION: Screening mammogram in one year.(Code:[id number]-B-01Y) I have discussed the findings and recommendations with the patient. If applicable, a reminder letter will be sent to the patient regarding the next appointment. BI-RADS CATEGORY  2: Benign. Electronically Signed   By: Franki Cabot M.D.   On: 02/23/2022 15:43   [place] FNA BX THYROID 1ST LESION AFIRMA  Result Date: 02/22/2022 INDICATION: Indeterminate thyroid nodule EXAM: ULTRASOUND GUIDED FINE NEEDLE ASPIRATION OF INDETERMINATE THYROID NODULE COMPARISON:  US Thyroid 01/19/22 MEDICATIONS: None COMPLICATIONS: None immediate. TECHNIQUE: Informed written consent was obtained from the patient after a discussion of the risks, benefits and alternatives to treatment. Questions regarding the procedure were encouraged and answered. A timeout was performed prior to the initiation of the procedure. Pre-procedural ultrasound scanning demonstrated unchanged size and appearance of the indeterminate nodule within the thyroid The procedure was planned. The neck was prepped in the usual sterile fashion, and a sterile drape was applied covering the operative  field. A timeout was performed prior to the initiation of the procedure. Local anesthesia was provided with 1% lidocaine. Under direct ultrasound guidance, 5 FNA biopsies were performed of the nodule with a 27 gauge needle. Multiple ultrasound images were saved for procedural documentation purposes. The samples were prepared and submitted to pathology. Two of these specimens were reserved for Afirma testing. Limited post procedural scanning was negative for hematoma or additional complication. Dressings were placed. The patient tolerated the above procedures procedure well without immediate postprocedural complication. FINDINGS: Nodule reference number based on prior diagnostic ultrasound: 1 Maximum size: 3.9cm Location: Left; Mid ACR TI-RADS risk category: TR3 (3 points) Reason for biopsy: meets ACR TI-RADS criteria Ultrasound imaging confirms appropriate placement of the needles within the thyroid nodule. IMPRESSION: Technically successful ultrasound guided fine needle aspiration of left mid lobe thyroid nodule. Performed and read by Pasty Spillers, PA-C Electronically Signed   By: Corrie Mckusick D.O.   On: 02/22/2022 16:17   US ABDOMEN RUQ W/ELASTOGRAPHY  Result Date: 02/14/2022 CLINICAL DATA:  Cirrhosis. EXAM: US ABDOMEN LIMITED - RIGHT UPPER QUADRANT ULTRASOUND HEPATIC ELASTOGRAPHY TECHNIQUE: Sonography of the right upper quadrant was performed. In addition, ultrasound elastography evaluation of the liver was performed. A region of interest was placed within the right lobe of the liver. Following application of a compressive sonographic pulse, tissue compressibility was assessed. Multiple assessments were performed at the selected site. Median tissue compressibility was determined. Previously, hepatic stiffness was assessed by shear wave velocity. Based on recently published Society of Radiologists in Ultrasound consensus article, reporting is now recommended to be performed in the SI units of pressure  (kiloPascals) representing hepatic stiffness/elasticity. The obtained result is compared to the published reference standards. (cACLD = compensated Advanced Chronic Liver Disease) COMPARISON:  Ultrasound on 07/22/2021 and MRI on 12/16/2020 FINDINGS: ULTRASOUND ABDOMEN LIMITED RIGHT UPPER QUADRANT Gallbladder: At least 1 large gallstone is seen measuring approximately 3.5 cm. Gallbladder is incompletely distended. Diffuse wall thickening is seen measuring 6 mm, likely due to combination of incomplete distention and hepatocellular disease. No sonographic Murphy sign noted by sonographer. Common bile duct: Diameter: 3 mm, within normal limits. Liver: Capsular nodularity is again seen, consistent with cirrhosis. Mild perihepatic ascites is noted. A hyperechoic mass is seen in the posterior liver dome measuring 3.4 x 3.3 x 3.2 cm. This is consistent with a benign hemangioma is stable compared to prior MRI. No other liver lesions identified.  Portal vein is patent on color Doppler imaging with normal direction of blood flow towards the liver. ULTRASOUND HEPATIC ELASTOGRAPHY Device: Siemens Helix VTQ Patient position: Supine Transducer DAX Number of measurements: 10 Hepatic segment:  8 Median kPa: 14.6 IQR: 7.6 IQR/Median kPa ratio: 0.52 Data quality: IQR/Median kPa ratio of 0.3 or greater indicates reduced accuracy Diagnostic category:  >13 kPa: highly suggestive of cACLD The use of hepatic elastography is applicable to patients with viral hepatitis and non-alcoholic fatty liver disease. At this time, there is insufficient data for the referenced cut-off values and use in other causes of liver disease, including alcoholic liver disease. Patients, however, may be assessed by elastography and serve as their own reference standard/baseline. In patients with non-alcoholic liver disease, the values suggesting compensated advanced chronic liver disease (cACLD) may be lower, and patients may need additional testing with  elasticity results of 7-9 kPa. Please note that abnormal hepatic elasticity and shear wave velocities may also be identified in clinical settings other than with hepatic fibrosis, such as: acute hepatitis, elevated right heart and central venous pressures including use of beta blockers, veno-occlusive disease (Budd-Chiari), infiltrative processes such as mastocytosis/amyloidosis/infiltrative tumor/lymphoma, extrahepatic cholestasis, with hyperemia in the post-prandial state, and with liver transplantation. Correlation with patient history, laboratory data, and clinical condition recommended. Diagnostic Categories: < or =5 kPa: high probability of being normal < or =9 kPa: in the absence of other known clinical signs, rules out cACLD >9 kPa and ?13 kPa: suggestive of cACLD, but needs further testing >13 kPa: highly suggestive of cACLD > or =17 kPa: highly suggestive of cACLD with an increased probability of clinically significant portal hypertension IMPRESSION: ULTRASOUND RUQ: Cholelithiasis. No sonographic signs of acute cholecystitis or biliary ductal dilatation. Hepatic cirrhosis, with mild perihepatic ascites. Stable 3.4 cm benign hepatic hemangioma. No other hepatic mass identified. ULTRASOUND HEPATIC ELASTOGRAPHY: Median kPa:  14.6 Diagnostic category:  >13 kPa: highly suggestive of cACLD Electronically Signed   By: Marlaine Hind M.D.   On: 02/14/2022 08:49   SLEEP STUDY DOCUMENTS  Result Date: 02/14/2022 Ordered by an unspecified provider.   Microbiology Recent Results (from the past 240 hour(s))  Body fluid culture w Gram Stain     Status: None   Collection Time: 03/09/22 12:42 PM   Specimen: Peritoneal Washings; Body Fluid  Result Value Ref Range Status   Specimen Description PERITONEAL  Final   Special Requests PERITONEAL DIALYSIS  Final   Gram Stain   Final    WBC PRESENT, PREDOMINANTLY MONONUCLEAR NO ORGANISMS SEEN CYTOSPIN SMEAR    Culture   Final    NO GROWTH 3 DAYS Performed at  [facility], [address], [place], [place] [address]    Report Status 03/12/2022 FINAL  Final  MRSA Next Gen by PCR, Nasal     Status: None   Collection Time: 03/11/22 12:24 PM   Specimen: Nasal Mucosa; Nasal Swab  Result Value Ref Range Status   MRSA by PCR Next Gen NOT DETECTED NOT DETECTED Final    Comment: (NOTE) The GeneXpert MRSA Assay (FDA approved for NASAL specimens only), is one component of a comprehensive MRSA colonization surveillance program. It is not intended to diagnose MRSA infection nor to guide or monitor treatment for MRSA infections. Test performance is not FDA approved in patients less than 49 years old. Performed at [facility], [place] [address] [place], [place] [address]   Culture, blood (Routine X 2) w Reflex to ID Panel  Status: None (Preliminary result)   Collection Time: 03/12/22  2:57 PM   Specimen: BLOOD RIGHT HAND  Result Value Ref Range Status   Specimen Description BLOOD RIGHT HAND  Final   Special Requests IN PEDIATRIC BOTTLE Blood Culture adequate volume  Final   Culture   Final    NO GROWTH < 24 HOURS Performed at [facility], [place] [address] [place], [place] [address]    Report Status PENDING  Incomplete  Culture, blood (Routine X 2) w Reflex to ID Panel     Status: None (Preliminary result)   Collection Time: 03/12/22  3:06 PM   Specimen: BLOOD RIGHT WRIST  Result Value Ref Range Status   Specimen Description BLOOD RIGHT WRIST  Final   Special Requests   Final    BOTTLES DRAWN AEROBIC ONLY Blood Culture results may not be optimal due to an inadequate volume of blood received in culture bottles   Culture   Final    NO GROWTH < 24 HOURS Performed at [facility], [place] [address] [place], [place] [address]    Report Status PENDING  Incomplete    Lab Basic Metabolic Panel: Recent Labs  Lab 03/09/22 0945 03/11/22 0623 03/11/22 1612 03/11/22 1625 03/11/22 2224 03/12/22 0420 03/12/22 0615  03/12/22 1224 03/12/22 1522 03/12/22 1711 03/12/22 1717 03/18/22 0358  NA 126* 123*   < > 122*   < > 131*   < > 125* 123* 130* 126* 130*  K 4.2 5.2*   < > 3.7   < > 4.0   < > 4.8 4.1 5.2* 4.2 3.9  CL 84* 84*  --  82*  --  89*  --   --   --  83*  --  81*  CO2 19* 8*  --  10*  --  13*  --   --   --  14*  --  17*  GLUCOSE 85 89  --  161*  --  66*  --   --   --  64*  --  117*  BUN 75* 82*  --  79*  --  49*  --   --   --  32*  --  22*  CREATININE 7.95* 7.64*  --  7.58*  --  4.31*  --   --   --  2.63*  --  2.20*  CALCIUM 8.2* 7.8*  --  8.2*  --  8.2*  --   --   --  7.2*  --  6.5*  MG  --   --   --   --   --  2.1  --   --   --   --   --  1.9  PHOS 9.8* 10.9*  --   --   --  7.2*  --   --   --  3.4  --  5.6*   < > = values in this interval not displayed.   Liver Function Tests: Recent Labs  Lab 03/11/22 0623 03/11/22 1625 03/12/22 0420 03/12/22 1711 2022-03-18 0358  AST  --  56*  --   --   --   ALT  --  32  --   --   --   ALKPHOS  --  150*  --   --   --   BILITOT  --  1.2  --   --   --   PROT  --  4.8*  --   --   --  ALBUMIN <1.5* 2.4* 2.2* 1.8* <1.5*   No results for input(s): "LIPASE", "AMYLASE" in the last 168 hours. No results for input(s): "AMMONIA" in the last 168 hours. CBC: Recent Labs  Lab 03/08/22 0226 03/09/22 0945 03/11/22 0623 03/11/22 1612 03/12/22 0420 03/12/22 0615 03/12/22 1224 03/12/22 1358 03/12/22 1457 03/12/22 1522 03/12/22 1717  WBC 22.9* 29.0* 29.8*  --  26.7*  --   --   --  25.3*  --   --   HGB 11.5* 11.7* 11.9*   < > 11.0* 11.9* 12.2  --  10.2* 10.9* 11.2*  HCT 32.9* 33.7* 36.2   < > 30.1* 35.0* 36.0  --  29.2* 32.0* 33.0*  MCV 93.5 93.4 98.4  --  91.2  --   --   --  93.9  --   --   PLT 402* 376 263  --  148*  --   --  100* 102*  --   --    < > = values in this interval not displayed.   Cardiac Enzymes: Recent Labs  Lab 03/11/22 1953  CKTOTAL 94   Sepsis Labs: Recent Labs  Lab 03/09/22 0945 03/11/22 0623 03/11/22 1258 03/11/22 1830  03/11/22 2224 03/12/22 0105 03/12/22 0420 03/12/22 1457 03/12/22 1700  WBC 29.0* 29.8*  --   --   --   --  26.7* 25.3*  --   LATICACIDVEN  --   --    < > >9.0* >9.0* >9.0*  --   --  >9.0*   < > = values in this interval not displayed.   Hayden Pedro, AGACNP-BC [facility]  PCCM Pgr: [phone]

## 2022-04-04 NOTE — Progress Notes (Addendum)
eLink Physician-Brief Progress Note Patient Name: Lauren Osborn DOB: [birth year] MRN: [id number]   Date of Service  03-21-2022  HPI/Events of Note  Notified of patient complain of leg pain 8/10.  Pt is NPO.  She is maxed on pressors- epinephrine, norepinephrine, phenylephrine and vasopressin.   eICU Interventions  Fentanyl PRN.  Increase max dose of levophed upto 29mg/min.      Intervention Category Intermediate Interventions: Pain - evaluation and management  [name]707-18-23 2:13 AM  6:01 AM Notified that patient is complaining of more pain.  Spoke with the patient's daughter about treating the pain and she was agreeable with starting on low dose fentanyl gtt.   I explained that this is not treating underlying problem but could only make the patient comfortable.   PTT also at 190  from >200 on CRRT.   Plan> Start on low dose fentanyl.

## 2022-04-04 DEATH — deceased
